# Patient Record
Sex: Male | Born: 1960
Health system: Southern US, Community
[De-identification: ages and names within clinical notes are randomized; demographics above are authoritative.]

## PROBLEM LIST (undated history)

## (undated) DIAGNOSIS — A4902 Methicillin resistant Staphylococcus aureus infection, unspecified site: Secondary | ICD-10-CM

## (undated) DIAGNOSIS — F1010 Alcohol abuse, uncomplicated: Secondary | ICD-10-CM

## (undated) DIAGNOSIS — F1410 Cocaine abuse, uncomplicated: Secondary | ICD-10-CM

## (undated) DIAGNOSIS — R569 Unspecified convulsions: Secondary | ICD-10-CM

## (undated) DIAGNOSIS — I10 Essential (primary) hypertension: Secondary | ICD-10-CM

## (undated) DIAGNOSIS — I639 Cerebral infarction, unspecified: Secondary | ICD-10-CM

## (undated) DIAGNOSIS — Z720 Tobacco use: Secondary | ICD-10-CM

## (undated) HISTORY — PX: SKIN GRAFT: SHX250

## (undated) HISTORY — DX: Methicillin resistant Staphylococcus aureus infection, unspecified site: A49.02

## (undated) HISTORY — DX: Unspecified convulsions: R56.9

---

## 1998-09-17 ENCOUNTER — Encounter: Admission: RE | Admit: 1998-09-17 | Discharge: 1998-09-17 | Payer: Self-pay | Admitting: *Deleted

## 2008-12-18 ENCOUNTER — Emergency Department (HOSPITAL_COMMUNITY): Admission: EM | Admit: 2008-12-18 | Discharge: 2008-12-18 | Payer: Self-pay | Admitting: Emergency Medicine

## 2012-06-14 DIAGNOSIS — I639 Cerebral infarction, unspecified: Secondary | ICD-10-CM

## 2012-06-14 DIAGNOSIS — F1410 Cocaine abuse, uncomplicated: Secondary | ICD-10-CM

## 2012-06-14 HISTORY — DX: Cocaine abuse, uncomplicated: F14.10

## 2012-06-14 HISTORY — DX: Cerebral infarction, unspecified: I63.9

## 2013-01-15 ENCOUNTER — Other Ambulatory Visit: Payer: Self-pay

## 2013-01-15 ENCOUNTER — Inpatient Hospital Stay (HOSPITAL_COMMUNITY): Payer: Self-pay

## 2013-01-15 ENCOUNTER — Inpatient Hospital Stay (HOSPITAL_COMMUNITY)
Admission: EM | Admit: 2013-01-15 | Discharge: 2013-01-17 | DRG: 065 | Disposition: A | Discharge status: Home / Self Care | Payer: MEDICAID | Attending: Internal Medicine | Admitting: Internal Medicine

## 2013-01-15 ENCOUNTER — Emergency Department (HOSPITAL_COMMUNITY): Payer: Self-pay

## 2013-01-15 ENCOUNTER — Encounter (HOSPITAL_COMMUNITY): Payer: Self-pay | Admitting: Emergency Medicine

## 2013-01-15 ENCOUNTER — Emergency Department (INDEPENDENT_AMBULATORY_CARE_PROVIDER_SITE_OTHER)
Admission: EM | Admit: 2013-01-15 | Discharge: 2013-01-15 | Disposition: A | Discharge status: Nursing Home (Includes ICF) | Payer: Self-pay | Source: Home / Self Care

## 2013-01-15 ENCOUNTER — Encounter (HOSPITAL_COMMUNITY): Payer: Self-pay | Admitting: *Deleted

## 2013-01-15 DIAGNOSIS — F1410 Cocaine abuse, uncomplicated: Secondary | ICD-10-CM | POA: Diagnosis present

## 2013-01-15 DIAGNOSIS — I6350 Cerebral infarction due to unspecified occlusion or stenosis of unspecified cerebral artery: Principal | ICD-10-CM | POA: Diagnosis present

## 2013-01-15 DIAGNOSIS — I10 Essential (primary) hypertension: Secondary | ICD-10-CM

## 2013-01-15 DIAGNOSIS — R279 Unspecified lack of coordination: Secondary | ICD-10-CM | POA: Diagnosis present

## 2013-01-15 DIAGNOSIS — F1010 Alcohol abuse, uncomplicated: Secondary | ICD-10-CM

## 2013-01-15 DIAGNOSIS — F1090 Alcohol use, unspecified, uncomplicated: Secondary | ICD-10-CM | POA: Diagnosis present

## 2013-01-15 DIAGNOSIS — F1020 Alcohol dependence, uncomplicated: Secondary | ICD-10-CM | POA: Diagnosis present

## 2013-01-15 DIAGNOSIS — R42 Dizziness and giddiness: Secondary | ICD-10-CM | POA: Diagnosis present

## 2013-01-15 DIAGNOSIS — R269 Unspecified abnormalities of gait and mobility: Secondary | ICD-10-CM

## 2013-01-15 DIAGNOSIS — I639 Cerebral infarction, unspecified: Secondary | ICD-10-CM | POA: Diagnosis present

## 2013-01-15 DIAGNOSIS — F17200 Nicotine dependence, unspecified, uncomplicated: Secondary | ICD-10-CM

## 2013-01-15 DIAGNOSIS — R2689 Other abnormalities of gait and mobility: Secondary | ICD-10-CM

## 2013-01-15 LAB — CBC
HCT: 41.3 % (ref 39.0–52.0)
Hemoglobin: 14 g/dL (ref 13.0–17.0)
MCH: 32.6 pg (ref 26.0–34.0)
MCHC: 33.9 g/dL (ref 30.0–36.0)
MCV: 96.3 fL (ref 78.0–100.0)
Platelets: 229 10*3/uL (ref 150–400)
RBC: 4.29 MIL/uL (ref 4.22–5.81)
RDW: 13.1 % (ref 11.5–15.5)
WBC: 6.4 10*3/uL (ref 4.0–10.5)

## 2013-01-15 LAB — CBC WITH DIFFERENTIAL/PLATELET
Basophils Absolute: 0 10*3/uL (ref 0.0–0.1)
Basophils Relative: 1 % (ref 0–1)
Eosinophils Absolute: 0.1 10*3/uL (ref 0.0–0.7)
Eosinophils Relative: 2 % (ref 0–5)
HCT: 42.9 % (ref 39.0–52.0)
Hemoglobin: 14.3 g/dL (ref 13.0–17.0)
Lymphocytes Relative: 47 % — ABNORMAL HIGH (ref 12–46)
Lymphs Abs: 2.5 10*3/uL (ref 0.7–4.0)
MCH: 32.3 pg (ref 26.0–34.0)
MCHC: 33.3 g/dL (ref 30.0–36.0)
MCV: 96.8 fL (ref 78.0–100.0)
Monocytes Absolute: 0.5 10*3/uL (ref 0.1–1.0)
Monocytes Relative: 10 % (ref 3–12)
Neutro Abs: 2.2 10*3/uL (ref 1.7–7.7)
Neutrophils Relative %: 42 % — ABNORMAL LOW (ref 43–77)
Platelets: 217 10*3/uL (ref 150–400)
RBC: 4.43 MIL/uL (ref 4.22–5.81)
RDW: 13 % (ref 11.5–15.5)
WBC: 5.3 10*3/uL (ref 4.0–10.5)

## 2013-01-15 LAB — POCT I-STAT, CHEM 8
BUN: 6 mg/dL (ref 6–23)
Calcium, Ion: 1.19 mmol/L (ref 1.12–1.23)
Chloride: 103 mEq/L (ref 96–112)
Creatinine, Ser: 1.1 mg/dL (ref 0.50–1.35)
Glucose, Bld: 95 mg/dL (ref 70–99)
HCT: 47 % (ref 39.0–52.0)
Hemoglobin: 16 g/dL (ref 13.0–17.0)
Potassium: 3.7 mEq/L (ref 3.5–5.1)
Sodium: 139 mEq/L (ref 135–145)
TCO2: 24 mmol/L (ref 0–100)

## 2013-01-15 LAB — URINALYSIS, ROUTINE W REFLEX MICROSCOPIC
Bilirubin Urine: NEGATIVE
Glucose, UA: NEGATIVE mg/dL
Hgb urine dipstick: NEGATIVE
Ketones, ur: NEGATIVE mg/dL
Leukocytes, UA: NEGATIVE
Nitrite: NEGATIVE
Protein, ur: NEGATIVE mg/dL
Specific Gravity, Urine: 1.014 (ref 1.005–1.030)
Urobilinogen, UA: 0.2 mg/dL (ref 0.0–1.0)
pH: 5.5 (ref 5.0–8.0)

## 2013-01-15 LAB — RAPID URINE DRUG SCREEN, HOSP PERFORMED
Amphetamines: NOT DETECTED
Barbiturates: NOT DETECTED
Benzodiazepines: NOT DETECTED
Cocaine: POSITIVE — AB
Opiates: NOT DETECTED
Tetrahydrocannabinol: NOT DETECTED

## 2013-01-15 LAB — APTT: aPTT: 28 seconds (ref 24–37)

## 2013-01-15 LAB — CREATININE, SERUM
Creatinine, Ser: 0.99 mg/dL (ref 0.50–1.35)
GFR calc Af Amer: >90 mL/min (ref >90)
GFR calc non Af Amer: >90 mL/min (ref >90)

## 2013-01-15 LAB — PROTIME-INR
INR: 1.11 (ref 0.00–1.49)
Prothrombin Time: 14.1 seconds (ref 11.6–15.2)

## 2013-01-15 LAB — GLUCOSE, CAPILLARY: Glucose-Capillary: 80 mg/dL (ref 70–99)

## 2013-01-15 LAB — TROPONIN I: Troponin I: <0.3 ng/mL (ref <0.30)

## 2013-01-15 MED ORDER — SENNOSIDES-DOCUSATE SODIUM 8.6-50 MG PO TABS
1.0000 | ORAL_TABLET | Freq: Every evening | ORAL | Status: DC | PRN
  Filled 2013-01-15: qty 1

## 2013-01-15 MED ORDER — LABETALOL HCL 5 MG/ML IV SOLN
10.0000 mg | Freq: Once | INTRAVENOUS | Status: AC
  Administered 2013-01-15: 10 mg via INTRAVENOUS
  Filled 2013-01-15: qty 4

## 2013-01-15 MED ORDER — ENOXAPARIN SODIUM 40 MG/0.4ML ~~LOC~~ SOLN
40.0000 mg | SUBCUTANEOUS | Status: DC
  Administered 2013-01-15 – 2013-01-16 (×2): 40 mg via SUBCUTANEOUS
  Filled 2013-01-15 (×3): qty 0.4

## 2013-01-15 MED ORDER — PNEUMOCOCCAL VAC POLYVALENT 25 MCG/0.5ML IJ INJ
0.5000 mL | INJECTION | INTRAMUSCULAR | Status: AC
  Administered 2013-01-16: 0.5 mL via INTRAMUSCULAR
  Filled 2013-01-15: qty 0.5

## 2013-01-15 MED ORDER — HYDRALAZINE HCL 20 MG/ML IJ SOLN
10.0000 mg | Freq: Once | INTRAMUSCULAR | Status: AC
  Administered 2013-01-15: 10 mg via INTRAVENOUS
  Filled 2013-01-15: qty 1

## 2013-01-15 NOTE — ED Notes (Signed)
Patient denies pain, alert and oriented x 3 , mae x 4 , patient gait is off balance.  Patient reports gait issues since Friday.  Reports a fall Friday after onset of gait imbalance.

## 2013-01-15 NOTE — ED Notes (Signed)
[facility] Gerald [name] E, Patient was given the orange card application and instructions on where to take the completed application to. Patient does have my contact information if he has any questions.

## 2013-01-15 NOTE — H&P (Signed)
[facility] History and Physical  VIVAN AGOSTINO ZOX:[id number] DOB: November 14, 1960 DOA: 01/15/2013  Referring physician PCP: No PCP Per Patient  Specialists: (Neurology) Dr. Thana Farr  Chief Complaint: Right-sided weakness, right gait disturbance  HPI: Gerald [name] is an 52 y.o BM PMHx long-standing HTN, was not visited a physician and a significant amount of time, no PCP. Awoke Saturday AM and noted he was dizzy and having trouble with his gait. HE seemed to list to the right. He did not seek medical atttention at that time. He continued to have symptoms through Sunday into Monday AM. He visited his sister who took his BP this am and noted it was 173/102 and brought him to urgent care.  They told him to go to [facility] hospital for hypertensive urgency.Marland Kitchen HIs BP has ranged from 194-214/ 105-122. Currently he is awake and alert but when he walks he shows some weakness in his right leg. Currently patient performed all neurochecks within normal limits except for slight weakness in the right shoulder, Clydie Braun the right face when asked to smile   Review of Systems: The patient denies anorexia, fever, weight loss,, vision loss, decreased hearing, hoarseness, chest pain, syncope, dyspnea on exertion, peripheral edema, hemoptysis, abdominal pain, melena, hematochezia, severe indigestion/heartburn, hematuria, incontinence, genital sores, muscle weakness, suspicious skin lesions, transient blindness, depression, unusual weight change, abnormal bleeding, enlarged lymph nodes, angioedema, and breast masses.      Social History: Positive smokes 2-3 cigarettes per day x20 years, drinks 40 ounces x2 per night, negative drug, single, negative children    No Known Allergies  Family History  Problem Relation Age of Onset  . Hypertension Mother   . Hypertension Father      Prior to Admission medications   Not on File   Physical Exam: Filed Vitals:   01/15/13 1915 01/15/13 1930 01/15/13 1945  01/15/13 2021  BP: 180/107 187/99 165/98 188/110  Pulse: 69 97 83 84  Temp:    98.3 F (36.8 C)  TempSrc:    Oral  Resp: 23 19 21 22   Height:    6' (1.829 m)  Weight:    75.6 kg (166 lb 10.7 oz)  SpO2: 100% 96% 93% 100%     General: Alert, no acute distress    Eyes: He will he will round reactive to light and accommodation  Cardiovascular: Regular rhythm and rate, negative murmurs rubs gallops, DP/PT pulse 2+**  Respiratory: Clear 2 auscultation bilateral  Abdomen: Soft nontender nondistended plus bowel sounds  Musculoskeletal: Within normal  Neurologic: Pupils equal round reactive to light and accommodation, -- 2 through 12 grossly intact muscle strength 5/5 all extremities except for right shoulder unable to fully shrug right shoulder, sensation intact throughout, negative Romberg, negative pronator drift  Labs on Admission:  Basic Metabolic Panel:  Recent Labs Lab 01/15/13 1432  NA 139  K 3.7  CL 103  GLUCOSE 95  BUN 6  CREATININE 1.10   Liver Function Tests: No results found for this basename: AST, ALT, ALKPHOS, BILITOT, PROT, ALBUMIN,  in the last 168 hours No results found for this basename: LIPASE, AMYLASE,  in the last 168 hours No results found for this basename: AMMONIA,  in the last 168 hours CBC:  Recent Labs Lab 01/15/13 1423 01/15/13 1432  WBC 5.3  --   NEUTROABS 2.2  --   HGB 14.3 16.0  HCT 42.9 47.0  MCV 96.8  --   PLT 217  --    Cardiac Enzymes:  Recent Labs Lab 01/15/13 1423  TROPONINI <0.30    BNP (last 3 results) No results found for this basename: PROBNP,  in the last 8760 hours CBG:  Recent Labs Lab 01/15/13 1416  GLUCAP 80    Radiological Exams on Admission: Ct Head (brain) Wo Contrast  01/15/2013   *RADIOLOGY REPORT*  Clinical Data: Hypertension and balance difficulties  CT HEAD WITHOUT CONTRAST  Technique:  Contiguous axial images were obtained from the base of the skull through the vertex without contrast.   Comparison: None.  Findings: Bony calvarium is intact.  There is an area of decreased attenuation involving the anterior aspect of the internal capsule on the left as well as the left basal ganglia.  This is consistent with a subacute infarct.  No acute hemorrhage, acute infarction or space-occupying mass lesion is noted.  IMPRESSION: Subacute infarct on the left as described.  No acute abnormality is noted.   Original Report Authenticated By: Alcide Clever, M.D.    EKG: No EKG for comparison normal sinus rhythm with criteria for LVH    Assessment/Plan Active Problems:   * No active hospital problems. *   1. CVA; neurology is noted in consult it, he or Thana Farr in the ED. Treatment will be per protocol. Protocol was explained to patient girlfriend and sister. 2. HTN patient's blood pressure has been by approximately 20% which should be the goal for the next 24 hours. After that will continue to attempt to lower patient's BP prior to discharge. Nicotine dependence. Counseled patient that he must stop smoking and drinking   Code Status: Full Family Communication: Sister and girlfriend present during discussion of plan of care Disposition Plan: Per neuro  Time spent: 90 minute  Drema Dallas [facility] Pager [phone]  If 7PM-7AM, please contact night-coverage [email or web address] Password [facility] 01/15/2013, 8:57 PM

## 2013-01-15 NOTE — ED Provider Notes (Signed)
Gerald [name] is a 52 y.o. male who presents to Urgent Care today for hypertension and poor balance. Patient does not have health insurance and does not go to a doctor regularly. On Friday he developed poor balance and had a fall. He says that it was as though he were drunk. He was seen by a family member who obtained his blood pressure which was significantly elevated with a systolic pressure in the 170s. He notes that his poor balance has continued to today. He denies any weakness or numbness problems breathing or swallowing or speaking. He denies any dizziness or lightheadedness. He feels well otherwise with no headache or vision change.    PMH reviewed. Undiagnosed hypertension History  Substance Use Topics  . Smoking status: Current Every Day Smoker  . Smokeless tobacco: Not on file  . Alcohol Use: Yes   ROS as above Medications reviewed. No current facility-administered medications for this encounter.   No current outpatient prescriptions on file.    Exam:  BP 201/112  Temp(Src) 98.5 F (36.9 C) (Oral)  Resp 18  SpO2 100% Gen: Well NAD HEENT: EOMI,  MMM, PERRLA Lungs: CTABL Nl WOB Heart: RRR no MRG Abd: NABS, NT, ND Exts: Non edematous BL  LE, warm and well perfused.  Neuro: Alert and oriented cranial nerves II through XII are intact normal sensation and strength. Coordination is normal with normal finger-nose-finger and shin rub. Patient has difficulty with tandem gait often losing his balance.    No results found for this or any previous visit (from the past 24 hour(s)). No results found.  Assessment and Plan: 52 y.o. male with hypertension and neurological abnormality. I'm concerned patient had a cerebellar infarct or PRES syndrome. Plan to transfer to the emergency room for further evaluation and management.  Patient expresses understanding and agreement     Rodolph Bong, MD 01/15/13 1245

## 2013-01-15 NOTE — ED Notes (Addendum)
Reports blood pressure concerns on information sheet.  Onset Saturday of concerns.  Reports to staff feeling lightheaded

## 2013-01-15 NOTE — Consult Note (Signed)
Referring Physician: Ranae Palms    Chief Complaint: stroke  HPI:                                                                                                                                         Gerald [name] is an 52 y.o. male with no past medical history who awoke Saturday AM and noted he was dizzy and having trouble with his gait.  HE seemed to list to the right. He did not seek medical atttention at that time.  He continued to have symptoms through Sunday into Monday AM. He visited his sister who tool his BP this am and noted it was 173/102 and brought him to urgent care.  They told him to go to [facility] hospital for hypertensive urgency.Marland Kitchen HIs BP has ranged from 194-214/ 105-122.  Currently he is awake and alert but when he walks he shows some weakness in his right leg.   Date last known well: Date: 12/14/2012 Time last known well: Unable to determine tPA Given: No: out of window  No pertinent past medical history prior to admission  History reviewed. No pertinent past surgical history.  Family History  Problem Relation Age of Onset  . Hypertension Mother   . Hypertension Father    Social History:  reports that he has been smoking Cigarettes.  He has been smoking about 0.00 packs per day. He does not have any smokeless tobacco history on file. He reports that he drinks about 12.6 ounces of alcohol per week. He reports that he uses illicit drugs.  Allergies: No Known Allergies  Medications:                                                                                                                           No current facility-administered medications for this encounter.   No current outpatient prescriptions on file.    ROS:  History obtained from the patient  General ROS: negative for - chills, fatigue, fever, night sweats, weight gain or  weight loss Psychological ROS: negative for - behavioral disorder, hallucinations, memory difficulties, mood swings or suicidal ideation Ophthalmic ROS: negative for - blurry vision, double vision, eye pain or loss of vision ENT ROS: negative for - epistaxis, nasal discharge, oral lesions, sore throat, tinnitus or vertigo Allergy and Immunology ROS: negative for - hives or itchy/watery eyes Hematological and Lymphatic ROS: negative for - bleeding problems, bruising or swollen lymph nodes Endocrine ROS: negative for - galactorrhea, hair pattern changes, polydipsia/polyuria or temperature intolerance Respiratory ROS: negative for - cough, hemoptysis, shortness of breath or wheezing Cardiovascular ROS: negative for - chest pain, dyspnea on exertion, edema or irregular heartbeat Gastrointestinal ROS: negative for - abdominal pain, diarrhea, hematemesis, nausea/vomiting or stool incontinence Genito-Urinary ROS: negative for - dysuria, hematuria, incontinence or urinary frequency/urgency Musculoskeletal ROS: negative for - joint swelling or muscular weakness Neurological ROS: as noted in HPI Dermatological ROS: negative for rash and skin lesion changes  Neurologic Examination:                                                                                                      Blood pressure 184/99, pulse 66, temperature 97.7 F (36.5 C), temperature source Oral, resp. rate 19, height 6' (1.829 m), weight 72.576 kg (160 lb), SpO2 98.00%.  Mental Status: Alert, oriented, thought content appropriate.  Speech fluent without evidence of aphasia.  Able to follow 3 step commands without difficulty. Cranial Nerves: II: Discs flat bilaterally; Visual fields grossly normal, pupils equal, round, reactive to light and accommodation III,IV, VI: ptosis not present, extra-ocular motions intact bilaterally V,VII: smile symmetric, facial light touch sensation normal bilaterally VIII: hearing normal  bilaterally IX,X: gag reflex present XI: bilateral shoulder shrug XII: midline tongue extension Motor: Right : Upper extremity   5/5    Left:     Upper extremity   5/5  Lower extremity   5/5     Lower extremity   5/5 Tone and bulk:normal tone throughout; no atrophy noted Sensory: Pinprick and light touch intact throughout, bilaterally Deep Tendon Reflexes:  Right: Upper Extremity   Left: Upper extremity   biceps (C-5 to C-6) 2/4   biceps (C-5 to C-6) 2/4 tricep (C7) 2/4    triceps (C7) 2/4 Brachioradialis (C6) 2/4  Brachioradialis (C6) 2/4  Lower Extremity Lower Extremity  quadriceps (L-2 to L-4) 2/4   quadriceps (L-2 to L-4) 2/4 Achilles (S1) 2/4   Achilles (S1) 2/4  Plantars: Right: downgoing   Left: downgoing Cerebellar: normal finger-to-nose,  normal heel-to-shin test Gait: he does show right LE weakness when walking making him list to the right.  CV: pulses palpable throughout    Results for orders placed during the hospital encounter of 01/15/13 (from the past 48 hour(s))  GLUCOSE, CAPILLARY     Status: None   Collection Time    01/15/13  2:16 PM      Result Value Range   Glucose-Capillary 80  70 - 99 mg/dL  PROTIME-INR  Status: None   Collection Time    01/15/13  2:23 PM      Result Value Range   Prothrombin Time 14.1  11.6 - 15.2 seconds   INR 1.11  0.00 - 1.49  APTT     Status: None   Collection Time    01/15/13  2:23 PM      Result Value Range   aPTT 28  24 - 37 seconds  CBC WITH DIFFERENTIAL     Status: Abnormal   Collection Time    01/15/13  2:23 PM      Result Value Range   WBC 5.3  4.0 - 10.5 K/uL   RBC 4.43  4.22 - 5.81 MIL/uL   Hemoglobin 14.3  13.0 - 17.0 g/dL   HCT 16.1  09.6 - 04.5 %   MCV 96.8  78.0 - 100.0 fL   MCH 32.3  26.0 - 34.0 pg   MCHC 33.3  30.0 - 36.0 g/dL   RDW 40.9  81.1 - 91.4 %   Platelets 217  150 - 400 K/uL   Neutrophils Relative % 42 (*) 43 - 77 %   Neutro Abs 2.2  1.7 - 7.7 K/uL   Lymphocytes Relative 47 (*) 12 - 46  %   Lymphs Abs 2.5  0.7 - 4.0 K/uL   Monocytes Relative 10  3 - 12 %   Monocytes Absolute 0.5  0.1 - 1.0 K/uL   Eosinophils Relative 2  0 - 5 %   Eosinophils Absolute 0.1  0.0 - 0.7 K/uL   Basophils Relative 1  0 - 1 %   Basophils Absolute 0.0  0.0 - 0.1 K/uL  TROPONIN I     Status: None   Collection Time    01/15/13  2:23 PM      Result Value Range   Troponin I <0.30  <0.30 ng/mL   Comment:            Due to the release kinetics of cTnI,     a negative result within the first hours     of the onset of symptoms does not rule out     myocardial infarction with certainty.     If myocardial infarction is still suspected,     repeat the test at appropriate intervals.  POCT I-STAT, CHEM 8     Status: None   Collection Time    01/15/13  2:32 PM      Result Value Range   Sodium 139  135 - 145 mEq/L   Potassium 3.7  3.5 - 5.1 mEq/L   Chloride 103  96 - 112 mEq/L   BUN 6  6 - 23 mg/dL   Creatinine, Ser 7.82  0.50 - 1.35 mg/dL   Glucose, Bld 95  70 - 99 mg/dL   Calcium, Ion 9.56  2.13 - 1.23 mmol/L   TCO2 24  0 - 100 mmol/L   Hemoglobin 16.0  13.0 - 17.0 g/dL   HCT 08.6  57.8 - 46.9 %  URINALYSIS, ROUTINE W REFLEX MICROSCOPIC     Status: None   Collection Time    01/15/13  3:38 PM      Result Value Range   Color, Urine YELLOW  YELLOW   APPearance CLEAR  CLEAR   Specific Gravity, Urine 1.014  1.005 - 1.030   pH 5.5  5.0 - 8.0   Glucose, UA NEGATIVE  NEGATIVE mg/dL   Hgb urine dipstick NEGATIVE  NEGATIVE   Bilirubin  Urine NEGATIVE  NEGATIVE   Ketones, ur NEGATIVE  NEGATIVE mg/dL   Protein, ur NEGATIVE  NEGATIVE mg/dL   Urobilinogen, UA 0.2  0.0 - 1.0 mg/dL   Nitrite NEGATIVE  NEGATIVE   Leukocytes, UA NEGATIVE  NEGATIVE   Comment: MICROSCOPIC NOT DONE ON URINES WITH NEGATIVE PROTEIN, BLOOD, LEUKOCYTES, NITRITE, OR GLUCOSE <1000 mg/dL.  URINE RAPID DRUG SCREEN (HOSP PERFORMED)     Status: Abnormal   Collection Time    01/15/13  3:38 PM      Result Value Range   Opiates NONE  DETECTED  NONE DETECTED   Cocaine POSITIVE (*) NONE DETECTED   Benzodiazepines NONE DETECTED  NONE DETECTED   Amphetamines NONE DETECTED  NONE DETECTED   Tetrahydrocannabinol NONE DETECTED  NONE DETECTED   Barbiturates NONE DETECTED  NONE DETECTED   Comment:            DRUG SCREEN FOR MEDICAL PURPOSES     ONLY.  IF CONFIRMATION IS NEEDED     FOR ANY PURPOSE, NOTIFY LAB     WITHIN 5 DAYS.                LOWEST DETECTABLE LIMITS     FOR URINE DRUG SCREEN     Drug Class       Cutoff (ng/mL)     Amphetamine      1000     Barbiturate      200     Benzodiazepine   200     Tricyclics       300     Opiates          300     Cocaine          300     THC              50   Ct Head (brain) Wo Contrast  01/15/2013   *RADIOLOGY REPORT*  Clinical Data: Hypertension and balance difficulties  CT HEAD WITHOUT CONTRAST  Technique:  Contiguous axial images were obtained from the base of the skull through the vertex without contrast.  Comparison: None.  Findings: Bony calvarium is intact.  There is an area of decreased attenuation involving the anterior aspect of the internal capsule on the left as well as the left basal ganglia.  This is consistent with a subacute infarct.  No acute hemorrhage, acute infarction or space-occupying mass lesion is noted.  IMPRESSION: Subacute infarct on the left as described.  No acute abnormality is noted.   Original Report Authenticated By: Alcide Clever, M.D.    Felicie Morn PA-C [facility] [phone]  01/15/2013, 6:46 PM   Patient seen and examined.  Clinical course and management discussed.  Necessary edits performed.  I agree with the above.  Assessment and plan of care developed and discussed below.  Assessment: 52 y.o. male presenting with complaints of difficulty with gait and markedly elevated blood pressure.  Examination otherwise fairly unremarkable.  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  Patient with  vascular risk factors of smoking, hypertension and cocaine use.  Further work up recommended.  Stroke Risk Factors - hypertension, smoking, illicit drug use  Plan: 1. HgbA1c, fasting lipid panel 2. MRI, MRA  of the brain without contrast 3. PT consult, OT consult, Speech consult 4. Echocardiogram 5. Carotid dopplers 6. Prophylactic therapy-Antiplatelet med: Aspirin - dose 325mg  daily 7. Smoking cessation counseling.  ETOH and drug counseling as well. 8. Telemetry monitoring  9. Frequent neuro checks 10. BP control     Thana Farr, MD [facility] [phone]  01/15/2013  6:46 PM

## 2013-01-15 NOTE — ED Notes (Signed)
PT states he has been stumbling when he walks since Saturday. Pt also states his pressure has never been high except when he went to the Pcp today and they told him it was elevated so he does not take any medications.  Pt states he developed a slight headache today.

## 2013-01-15 NOTE — ED Notes (Signed)
Since Sat pt has been having difficulty keeping his balance.  Was sent here from UC for bp of 193/113.  Speech intact.  Pt c/o slight headache.

## 2013-01-15 NOTE — ED Notes (Signed)
Pt ambulated from bed to door without assistance, states he felt a little dizzy.

## 2013-01-15 NOTE — ED Provider Notes (Signed)
CSN: [id number]     Arrival date & time 01/15/13  1308 History     First MD Initiated Contact with Patient 01/15/13 1448     Chief Complaint  Patient presents with  . Hypertension  . Gait Problem   (Consider location/radiation/quality/duration/timing/severity/associated sxs/prior Treatment) HPI Comments: 52 y.o. M presenting from Urgent Care center today due to HTN and dizziness.  Pt is unsure about prior h/o HTN, states he does not see a physician regularly.  Symptom onset 2 days ago, pt states he had sudden onset of lightheadedness and near-syncope, states he fell backwards but fell into a stand which caught him- did not fall to floor or hit head.  Since then pt has had persistent lightheadedness which he states occasionally feels as if room is spinning around him, and reports "clumsiness" with walking.  Pt went to sister's house where they took his BP and found it to be elevated in the 170s systolic.  Pt seen at urgent care following this and referred here for further evaluation.  He reports mild generalized HA and lightheadedness worse with walking, no neck pain or stiffness.  No vision changes, numbness, or weakness.  Denies chest pain or dyspnea.  Patient is a 52 y.o. male presenting with hypertension and neurologic complaint. The history is provided by the patient.  Hypertension This is a new problem. Episode onset: 2 days ago. The problem occurs constantly. The problem has been unchanged. Associated symptoms include vertigo (intermittent). Pertinent negatives include no abdominal pain, arthralgias, change in bowel habit, chest pain, chills, coughing, diaphoresis, fatigue, fever, headaches, myalgias, nausea, neck pain, numbness, urinary symptoms, visual change, vomiting or weakness. Associated symptoms comments: lightheadedness. Nothing aggravates the symptoms. He has tried nothing for the symptoms. The treatment provided no relief.  Neurologic Problem This is a new problem. Episode onset: 2  days ago. The problem occurs constantly. The problem has been unchanged. Associated symptoms include vertigo (intermittent). Pertinent negatives include no abdominal pain, arthralgias, change in bowel habit, chest pain, chills, coughing, diaphoresis, fatigue, fever, headaches, myalgias, nausea, neck pain, numbness, urinary symptoms, visual change, vomiting or weakness. Associated symptoms comments: Gait disturbance- "clumsiness". Nothing aggravates the symptoms. He has tried nothing for the symptoms. The treatment provided no relief.    History reviewed. No pertinent past medical history. Past Surgical History  Procedure Laterality Date  . Skin graft  Left Leg    1980s   Family History  Problem Relation Age of Onset  . Hypertension Mother   . Hypertension Father    History  Substance Use Topics  . Smoking status: Current Every Day Smoker -- 0.25 packs/day for 20 years    Types: Cigarettes  . Smokeless tobacco: Not on file  . Alcohol Use: 7.2 oz/week    12 Cans of beer per week     Comment: drinks 2-40oz beers per day    Review of Systems  Constitutional: Negative for fever, chills, diaphoresis, appetite change and fatigue.  HENT: Negative for neck pain and neck stiffness.   Eyes: Negative for photophobia and visual disturbance.  Respiratory: Negative for cough, chest tightness, shortness of breath and wheezing.   Cardiovascular: Negative for chest pain, palpitations and leg swelling.  Gastrointestinal: Negative for nausea, vomiting, abdominal pain, diarrhea, abdominal distention and change in bowel habit.  Musculoskeletal: Positive for gait problem. Negative for myalgias, back pain and arthralgias.  Neurological: Positive for dizziness, vertigo (intermittent) and light-headedness. Negative for syncope, facial asymmetry, speech difficulty, weakness, numbness and headaches.  All other  systems reviewed and are negative.    Allergies  Review of patient's allergies indicates no known  allergies.  Home Medications  No current outpatient prescriptions on file. BP 161/102  Pulse 71  Temp(Src) 98.5 F (36.9 C) (Oral)  Resp 19  Ht 6' (1.829 m)  Wt 166 lb 10.7 oz (75.6 kg)  BMI 22.6 kg/m2  SpO2 100% Physical Exam  Nursing note and vitals reviewed. Constitutional: He is oriented to person, place, and time. He appears well-developed and well-nourished. No distress.  HENT:  Head: Normocephalic and atraumatic.  Eyes: EOM are normal. Pupils are equal, round, and reactive to light.  Neck: Normal range of motion. Neck supple.  Cardiovascular: Normal rate, regular rhythm, normal heart sounds and intact distal pulses.   Pulmonary/Chest: Effort normal and breath sounds normal. No respiratory distress. He has no wheezes. He has no rales.  Abdominal: Soft. Bowel sounds are normal. He exhibits no distension. There is no tenderness. There is no rebound and no guarding.  Musculoskeletal: Normal range of motion. He exhibits no edema and no tenderness.  Neurological: He is alert and oriented to person, place, and time. He has normal strength. He displays normal reflexes. No cranial nerve deficit or sensory deficit. He exhibits normal muscle tone. He displays a negative Romberg sign. Coordination and gait normal. GCS eye subscore is 4. GCS verbal subscore is 5. GCS motor subscore is 6.  Skin: Skin is warm and dry. He is not diaphoretic.    ED Course   Procedures (including critical care time)  Labs Reviewed  CBC WITH DIFFERENTIAL - Abnormal; Notable for the following:    Neutrophils Relative % 42 (*)    Lymphocytes Relative 47 (*)    All other components within normal limits  URINE RAPID DRUG SCREEN (HOSP PERFORMED) - Abnormal; Notable for the following:    Cocaine POSITIVE (*)    All other components within normal limits  MRSA PCR SCREENING  URINALYSIS, ROUTINE W REFLEX MICROSCOPIC  GLUCOSE, CAPILLARY  PROTIME-INR  APTT  TROPONIN I  CBC  CREATININE, SERUM  HEMOGLOBIN A1C   LIPID PANEL  POCT I-STAT, CHEM 8   Dg Chest 2 View  01/15/2013   *RADIOLOGY REPORT*  Clinical Data: Hypertension.  CHEST - 2 VIEW  Comparison: None.  Findings: Lungs are clear.  Heart size is normal.  No pneumothorax or pleural effusion.  No focal bony abnormality.  IMPRESSION: No acute disease.   Original Report Authenticated By: Holley Dexter, M.D.   Ct Head (brain) Wo Contrast  01/15/2013   *RADIOLOGY REPORT*  Clinical Data: Hypertension and balance difficulties  CT HEAD WITHOUT CONTRAST  Technique:  Contiguous axial images were obtained from the base of the skull through the vertex without contrast.  Comparison: None.  Findings: Bony calvarium is intact.  There is an area of decreased attenuation involving the anterior aspect of the internal capsule on the left as well as the left basal ganglia.  This is consistent with a subacute infarct.  No acute hemorrhage, acute infarction or space-occupying mass lesion is noted.  IMPRESSION: Subacute infarct on the left as described.  No acute abnormality is noted.   Original Report Authenticated By: Alcide Clever, M.D.   1. CVA (cerebral infarction)   2. Hypertension   3. Alcohol abuse, unspecified   4. HTN (hypertension)   5. Nicotine dependence     MDM  52 y.o. M presenting from Urgent Care center today due to HTN and dizziness ongoing for the past 2 days.  He reports near syncopal event 2 days ago and gait disturbance described as clumsiness. Vital signs notable for HTN in 200s/100s. Physical exam WNL, no focal neuro deficits or current gait abnormalities.  Will check CT head to r/o CVA or other intracranial abnormality.  CBC, CMP, PT/INR obtained.  Labetalol for HTN.  CT head shows L-sided subacute infarct.  Lab results WNL, normal INR and negative troponin.  Pt to be admitted for further workup of CVA and malignant HTN.  BP remains in 200s/100s, hydralazine given.  Pt stable at time of admission.  Discussed with attending Dr. Ranae Palms.  Jodean [name], MD 01/16/13 [phone]

## 2013-01-16 ENCOUNTER — Inpatient Hospital Stay (HOSPITAL_COMMUNITY): Payer: MEDICAID

## 2013-01-16 DIAGNOSIS — I6350 Cerebral infarction due to unspecified occlusion or stenosis of unspecified cerebral artery: Principal | ICD-10-CM

## 2013-01-16 DIAGNOSIS — F1010 Alcohol abuse, uncomplicated: Secondary | ICD-10-CM

## 2013-01-16 DIAGNOSIS — I10 Essential (primary) hypertension: Secondary | ICD-10-CM

## 2013-01-16 DIAGNOSIS — I517 Cardiomegaly: Secondary | ICD-10-CM

## 2013-01-16 DIAGNOSIS — F17200 Nicotine dependence, unspecified, uncomplicated: Secondary | ICD-10-CM

## 2013-01-16 LAB — LIPID PANEL
Cholesterol: 145 mg/dL (ref 0–200)
HDL: 71 mg/dL (ref >39)
LDL Cholesterol: 62 mg/dL (ref 0–99)
Total CHOL/HDL Ratio: 2 RATIO
Triglycerides: 61 mg/dL (ref <150)
VLDL: 12 mg/dL (ref 0–40)

## 2013-01-16 LAB — HEMOGLOBIN A1C
Hgb A1c MFr Bld: 5.1 % (ref <5.7)
Mean Plasma Glucose: 100 mg/dL (ref <117)

## 2013-01-16 LAB — MRSA PCR SCREENING: MRSA by PCR: NEGATIVE

## 2013-01-16 MED ORDER — LISINOPRIL 20 MG PO TABS
20.0000 mg | ORAL_TABLET | Freq: Every day | ORAL | Status: DC
  Administered 2013-01-16 – 2013-01-17 (×2): 20 mg via ORAL
  Filled 2013-01-16 (×2): qty 1

## 2013-01-16 MED ORDER — HYDROCHLOROTHIAZIDE 25 MG PO TABS
25.0000 mg | ORAL_TABLET | Freq: Every day | ORAL | Status: DC
  Administered 2013-01-16 – 2013-01-17 (×2): 25 mg via ORAL
  Filled 2013-01-16 (×2): qty 1

## 2013-01-16 MED ORDER — ASPIRIN EC 325 MG PO TBEC
325.0000 mg | DELAYED_RELEASE_TABLET | Freq: Every day | ORAL | Status: DC
  Administered 2013-01-16 – 2013-01-17 (×2): 325 mg via ORAL
  Filled 2013-01-16 (×2): qty 1

## 2013-01-16 NOTE — Progress Notes (Signed)
[facility] Progress Note [place] TEAM 1 - Stepdown/ICU TEAM   Gerald [name] IHK:[id number] DOB: 19-Feb-1961 DOA: 01/15/2013 PCP: No PCP Per Patient  Brief narrative: Gerald [name] is an 52 y.o BM PMHx long-standing HTN, was not visited a physician and a significant amount of time, no PCP. Awoke Saturday AM and noted he was dizzy and having trouble with his gait. HE seemed to list to the right. He did not seek medical atttention at that time. He continued to have symptoms through Sunday into Monday AM. He visited his sister who took his BP this am and noted it was 173/102 and brought him to urgent care. They told him to go to [facility] hospital for hypertensive urgency   Assessment/Plan: Principal Problem:   CVA (cerebral infarction) - f/u MRI, carotid dopplers and ECHO- cont ASA  Active Problems:   HTN (hypertension) - permissive HTN allowed yesterday - will start lisinopril and hctz now    Nicotine dependence - has been advised to quit  Cocaine abuse - advised to quit and informed of relationship with CVA    Alcohol abuse, unspecified - also advised to quit.     Code Status: full code Family Communication: fiance at bedside Disposition Plan: transfer to tele  Consultants: neuro  Procedures: none  Antibiotics: none  DVT prophylaxis: lovenox  HPI/Subjective: Pt states balance issues have resolved.    Objective: Blood pressure 148/97, pulse 89, temperature 98.5 F (36.9 C), temperature source Oral, resp. rate 18, height 6' (1.829 m), weight 77.021 kg (169 lb 12.8 oz), SpO2 99.00%.  Intake/Output Summary (Last 24 hours) at 01/16/13 2115 Last data filed at 01/16/13 1800  Gross per 24 hour  Intake    240 ml  Output   1050 ml  Net   -810 ml     Exam: General: No acute respiratory distress Lungs: Clear to auscultation bilaterally without wheezes or crackles Cardiovascular: Regular rate and rhythm without murmur gallop or rub normal S1 and S2 Abdomen:  Nontender, nondistended, soft, bowel sounds positive, no rebound, no ascites, no appreciable mass Extremities: No significant cyanosis, clubbing, or edema bilateral lower extremities  Data Reviewed: Basic Metabolic Panel:  Recent Labs Lab 01/15/13 1432 01/15/13 2129  NA 139  --   K 3.7  --   CL 103  --   GLUCOSE 95  --   BUN 6  --   CREATININE 1.10 0.99   Liver Function Tests: No results found for this basename: AST, ALT, ALKPHOS, BILITOT, PROT, ALBUMIN,  in the last 168 hours No results found for this basename: LIPASE, AMYLASE,  in the last 168 hours No results found for this basename: AMMONIA,  in the last 168 hours CBC:  Recent Labs Lab 01/15/13 1423 01/15/13 1432 01/15/13 2129  WBC 5.3  --  6.4  NEUTROABS 2.2  --   --   HGB 14.3 16.0 14.0  HCT 42.9 47.0 41.3  MCV 96.8  --  96.3  PLT 217  --  229   Cardiac Enzymes:  Recent Labs Lab 01/15/13 1423  TROPONINI <0.30   BNP (last 3 results) No results found for this basename: PROBNP,  in the last 8760 hours CBG:  Recent Labs Lab 01/15/13 1416  GLUCAP 80    Recent Results (from the past 240 hour(s))  MRSA PCR SCREENING     Status: None   Collection Time    01/15/13 11:59 PM      Result Value Range Status   MRSA  by PCR NEGATIVE  NEGATIVE Final   Comment:            The GeneXpert MRSA Assay (FDA     approved for NASAL specimens     only), is one component of a     comprehensive MRSA colonization     surveillance program. It is not     intended to diagnose MRSA     infection nor to guide or     monitor treatment for     MRSA infections.     Studies:  Recent x-ray studies have been reviewed in detail by the Attending Physician  Scheduled Meds:  Scheduled Meds: . aspirin EC  325 mg Oral Daily  . enoxaparin (LOVENOX) injection  40 mg Subcutaneous Q24H  . hydrochlorothiazide  25 mg Oral Daily  . lisinopril  20 mg Oral Daily   Continuous Infusions:   Time spent on care of this patient: 35  min   Gerald Cantor, MD  [facility] Office  [phone] Pager - Text Page per Loretha Stapler as per below:  On-Call/Text Page:      [email or web address]      password [email or web address]  If 7PM-7AM, please contact night-coverage [email or web address] Password [email or web address] 01/16/2013, 9:15 PM   LOS: 1 day

## 2013-01-16 NOTE — Progress Notes (Signed)
Stroke Team Progress Note  HISTORY HPI:  Gerald [name] is an 52 y.o. male with no past medical history who awoke Saturday AM and noted he was dizzy and having trouble with his gait. HE seemed to list to the right. He did not seek medical atttention at that time. He continued to have symptoms through Sunday into Monday AM. He visited his sister who tool his BP this am and noted it was 173/102 and brought him to urgent care. They told him to go to [name] hospital for hypertensive urgency.Marland Kitchen HIs BP has ranged from 194-214/ 105-122. Currently he is awake and alert but when he walks he shows some weakness in his right leg.  Date last known well: Date: 12/14/2012  Time last known well: Unable to determine  tPA Given: No: out of window   Patient was not a TPA candidate secondary to unknown LKW. He was admitted to the neuro floor for further evaluation and treatment.  SUBJECTIVE  Lying in bed. Family at bedside. Feels like symptoms are rapidly improving.    OBJECTIVE Most recent Vital Signs: Filed Vitals:   01/15/13 2021 01/15/13 2328 01/16/13 0504 01/16/13 0700  BP: 188/110 161/102 160/92   Pulse: 84 71 69   Temp: 98.3 F (36.8 C) 98.5 F (36.9 C) 98.4 F (36.9 C) 98.6 F (37 C)  TempSrc: Oral Oral Oral Oral  Resp: 22 19 17    Height: 6' (1.829 m)     Weight: 75.6 kg (166 lb 10.7 oz)     SpO2: 100% 100% 94%    CBG (last 3)   Recent Labs  01/15/13 1416  GLUCAP 80    IV Fluid Intake:     MEDICATIONS  . enoxaparin (LOVENOX) injection  40 mg Subcutaneous Q24H  . pneumococcal 23 valent vaccine  0.5 mL Intramuscular Tomorrow-1000   PRN:  senna-docusate  Diet:  Cardiac thin liquids Activity:  Bedrest  DVT Prophylaxis:  lovenox  CLINICALLY SIGNIFICANT STUDIES Basic Metabolic Panel:  Recent Labs Lab 01/15/13 1432 01/15/13 2129  NA 139  --   K 3.7  --   CL 103  --   GLUCOSE 95  --   BUN 6  --   CREATININE 1.10 0.99   Liver Function Tests: No results found for this  basename: AST, ALT, ALKPHOS, BILITOT, PROT, ALBUMIN,  in the last 168 hours CBC:  Recent Labs Lab 01/15/13 1423 01/15/13 1432 01/15/13 2129  WBC 5.3  --  6.4  NEUTROABS 2.2  --   --   HGB 14.3 16.0 14.0  HCT 42.9 47.0 41.3  MCV 96.8  --  96.3  PLT 217  --  229   Coagulation:  Recent Labs Lab 01/15/13 1423  LABPROT 14.1  INR 1.11   Cardiac Enzymes:  Recent Labs Lab 01/15/13 1423  TROPONINI <0.30   Urinalysis:  Recent Labs Lab 01/15/13 1538  COLORURINE YELLOW  LABSPEC 1.014  PHURINE 5.5  GLUCOSEU NEGATIVE  HGBUR NEGATIVE  BILIRUBINUR NEGATIVE  KETONESUR NEGATIVE  PROTEINUR NEGATIVE  UROBILINOGEN 0.2  NITRITE NEGATIVE  LEUKOCYTESUR NEGATIVE   Lipid Panel    Component Value Date/Time   CHOL 145 01/16/2013 0440   TRIG 61 01/16/2013 0440   HDL 71 01/16/2013 0440   CHOLHDL 2.0 01/16/2013 0440   VLDL 12 01/16/2013 0440   LDLCALC 62 01/16/2013 0440   HgbA1C  No results found for this basename: HGBA1C    Urine Drug Screen:     Component Value Date/Time   LABOPIA  NONE DETECTED 01/15/2013 1538   COCAINSCRNUR POSITIVE* 01/15/2013 1538   LABBENZ NONE DETECTED 01/15/2013 1538   AMPHETMU NONE DETECTED 01/15/2013 1538   THCU NONE DETECTED 01/15/2013 1538   LABBARB NONE DETECTED 01/15/2013 1538    Alcohol Level: No results found for this basename: ETH,  in the last 168 hours  Dg Chest 2 View 01/15/2013   No acute disease  Ct Head (brain) Wo Contrast 01/15/2013   Subacute infarct on the left basal ganglia.  No acute abnormality is noted.      MRI of the brain    MRA of the brain    2D Echocardiogram  EF 55%.no abnormal wall motion, left atrium normal in size  Carotid Doppler  Bilateral: 1-39% ICA stenosis. Vertebral artery flow is antegrade  EKG  normal sinus rhythm.   Therapy Recommendations NONE  Physical Exam   Pleasant middle-age male currently not in distress .Awake alert. Afebrile. Head is nontraumatic. Neck is supple without bruit. Hearing is normal. Cardiac exam no  murmur or gallop. Lungs are clear to auscultation. Distal pulses are well felt. Neurological Exam : Awake alert oriented x3 with normal speech and language function. Fundi were not visualized. Vision is daily and fields appear normal. There is minimum right lower facial asymmetry. Tongue is midline. Motor system exam reveals no upper or lower extremity drift but finds movements are diminished on the right. Is mild right grip weakness. Orbits left-over-right upper extremity. Mild weakness of right hip flexors and ankle dorsiflexors. Deep tendon reflexes are 1+ symmetric. Plantars are downgoing. Sensation appears intact coordination is slow but accurate. Gait was not tested ASSESSMENT Gerald [name] is a 52 y.o. male presenting with vertigo and ataxia. Imaging confirms a left basal ganglia infarct. Infarct felt to be thrombotic due to small vessel disease.  On no antithrombotics prior to admission. Now on aspirin 325mg  daily for secondary stroke prevention. Patient with resultant ataxia, transient. Work up underway.   Malignant hypertension  Cocaine abuse  Hospital day # 1  TREATMENT/PLAN  Continue aspirin 325 mg orally every day for secondary stroke prevention.  Counseling on cessation of drug abuse and potential for vasoconstriction, thus causing clot formation.  Risk factor modification  Gerald [name]. Gerald [name], Gerald [name], MBA, MHA Gerald [name] Stroke [name] Pager: [phone] 01/16/2013 4:45 PM  I have personally obtained a history, examined the patient, evaluated imaging results, and formulated the assessment and plan of care. I agree with the above. Delia Heady, MD

## 2013-01-16 NOTE — Progress Notes (Signed)
Physical Therapy Evaluation and D/C Patient Details Name: Gerald [name] MRN: [id number] DOB: 1960-11-08 Today's Date: 01/16/2013 Time: 1478-2956 PT Time Calculation (min): 25 min  PT Assessment / Plan / Recommendation History of Present Illness  Pt admit with HTN and left CVA.   Clinical Impression  Pt admitted with CVA with resolution of symptoms per assessment today . Pt currently without physical  functional limitations even with challenges to balance.  Pt will not need skilled PT as he is fully functional currently.  Will sign off.      PT Assessment  Patent does not need any further PT services    Follow Up Recommendations  No PT follow up                Equipment Recommendations  None recommended by PT               Precautions / Restrictions Precautions Precautions: None Restrictions Weight Bearing Restrictions: No   Pertinent Vitals/Pain VSS, HA slight per pt      Mobility  Bed Mobility Bed Mobility: Rolling Right;Right Sidelying to Sit Rolling Right: 7: Independent Right Sidelying to Sit: 7: Independent Transfers Transfers: Sit to Stand;Stand to Sit Sit to Stand: 7: Independent Stand to Sit: 7: Independent Ambulation/Gait Ambulation/Gait Assistance: 7: Independent Ambulation Distance (Feet): 400 Feet Assistive device: None Ambulation/Gait Assistance Details: Pt ambulates without device without difficulty.  No LOB with challenges.   Gait Pattern: Within Functional Limits Stairs: No Wheelchair Mobility Wheelchair Mobility: No Modified Rankin (Stroke Patients Only) Pre-Morbid Rankin Score: No symptoms Modified Rankin: No symptoms      PT Goals(Current goals can be found in the care plan section)    Visit Information  Last PT Received On: 01/16/13 Assistance Needed: +1 History of Present Illness: Pt admit with HTN and left CVA.        Prior Functioning  Home Living Family/patient expects to be discharged to:: Private residence Living  Arrangements: Spouse/significant other Available Help at Discharge: Family;Available 24 hours/day Type of Home: Apartment Home Access: Stairs to enter Entrance Stairs-Number of Steps: 3 Entrance Stairs-Rails: Left Home Layout: One level Home Equipment: None Prior Function Level of Independence: Independent Communication Communication: No difficulties Dominant Hand: Right    Cognition  Cognition Arousal/Alertness: Awake/alert Behavior During Therapy: WFL for tasks assessed/performed Overall Cognitive Status: Within Functional Limits for tasks assessed    Extremity/Trunk Assessment Upper Extremity Assessment Upper Extremity Assessment: Defer to OT evaluation Lower Extremity Assessment Lower Extremity Assessment: Overall WFL for tasks assessed Cervical / Trunk Assessment Cervical / Trunk Assessment: Normal   Balance Balance Balance Assessed: Yes Standardized Balance Assessment Standardized Balance Assessment: Dynamic Gait Index Dynamic Gait Index Level Surface: Normal Change in Gait Speed: Normal Gait with Horizontal Head Turns: Normal Gait with Vertical Head Turns: Normal Gait and Pivot Turn: Normal Step Over Obstacle: Normal Step Around Obstacles: Normal Steps: Normal Total Score: 24  End of Session PT - End of Session Equipment Utilized During Treatment: Gait belt Activity Tolerance: Patient tolerated treatment well Patient left: in chair;with call bell/phone within reach;with family/visitor present Nurse Communication: Mobility status      Gerald [name] 01/16/2013, 9:29 AM [facility] [phone] (pager)

## 2013-01-16 NOTE — Evaluation (Signed)
Speech Language Pathology Evaluation Patient Details Name: Gerald [name] MRN: [id number] DOB: 24-Sep-1960 Today's Date: 01/16/2013 Time: 0272-5366 SLP Time Calculation (min): 20 min  Problem List:  Patient Active Problem List   Diagnosis Date Noted  . CVA (cerebral infarction) 01/15/2013  . HTN (hypertension) 01/15/2013  . Nicotine dependence 01/15/2013  . Alcohol abuse, unspecified 01/15/2013   Past Medical History: History reviewed. No pertinent past medical history. Past Surgical History:  Past Surgical History  Procedure Laterality Date  . Skin graft  Left Leg    1980s   HPI:  52 y.o BM PMHx long-standing HTN, was not visited a physician and a significant amount of time, no PCP. Awoke Saturday AM and noted he was dizzy and having trouble with his gait.  He did not seek medical atttention at that time. He continued to have symptoms through Sunday into Monday AM. He visited his sister who took his BP this am and noted it was 173/102 and brought him to urgent care. Currently he is awake and alert but when he walks he shows some weakness in his right leg.  CT revealed subacute infarct on the left as described. No acute abnormality is noted.    Assessment / Plan / Recommendation Clinical Impression  Assessment of pt.'s speech-language-cognitive abilities did not reveal impairments for items evaluated.  Fiance present who confirms speech intelligibility is at baseline status.  Able to write without difficulty.  No ST needed at this time.    SLP Assessment  Patient does not need any further Speech Lanaguage Pathology Services    Follow Up Recommendations  None    Frequency and Duration        Pertinent Vitals/Pain none       SLP Evaluation Prior Functioning  Cognitive/Linguistic Baseline: Within functional limits Type of Home: Apartment Available Help at Discharge: Family;Available 24 hours/day Vocation: Full time employment (works at [employer])   Cognition  Overall  Cognitive Status: Within Functional Limits for tasks assessed Safety/Judgment: Appears intact    Comprehension  Auditory Comprehension Overall Auditory Comprehension: Appears within functional limits for tasks assessed Visual Recognition/Discrimination Discrimination: Not tested Reading Comprehension Reading Status: Not tested    Expression Expression Primary Mode of Expression: Verbal Verbal Expression Overall Verbal Expression: Appears within functional limits for tasks assessed Level of Generative/Spontaneous Verbalization: Conversation Pragmatics: No impairment Written Expression Dominant Hand: Right Written Expression: Within Functional Limits   Oral / Motor Oral Motor/Sensory Function Overall Oral Motor/Sensory Function: Appears within functional limits for tasks assessed Motor Speech Overall Motor Speech: Appears within functional limits for tasks assessed Intelligibility: Intelligible Motor Planning: Witnin functional limits   GO     Breck Coons [employer].Ed [employer] [id number]  01/16/2013

## 2013-01-16 NOTE — Progress Notes (Signed)
Report called to [facility], receiving RN on 3W.  Pt transferred to 3W18 via wheelchair with all belongings. Pt called girlfriend with new room number.    Roselie Awkward, RN

## 2013-01-16 NOTE — Progress Notes (Signed)
Pt noted with slight Neuro changes at 0500. Left hand grip has decreased from strong to moderate and slight Left Pronator drift also noted. Pt is otherwise intact, alert and oriented lying in bed talking with family. Vitals are unchanged, pt with no complaints. Notified Maren Reamer, NP and Dr Leroy Kennedy (Neuro), no new orders received. Will continue monitoring

## 2013-01-16 NOTE — Evaluation (Signed)
Occupational Therapy Evaluation Patient Details Name: Gerald [name] MRN: [id number] DOB: 1960-09-20 Today's Date: 01/16/2013 Time: 0347-4259 OT Time Calculation (min): 14 min  OT Assessment / Plan / Recommendation History of present illness Pt admit with HTN and left CVA.    Clinical Impression   Pt demonstrates independence in ADL and mobility and reports all symptoms have resolved.  No further OT needs.    OT Assessment  Patient does not need any further OT services    Follow Up Recommendations  No OT follow up    Barriers to Discharge      Equipment Recommendations  None recommended by OT    Recommendations for Other Services    Frequency       Precautions / Restrictions Precautions Precautions: None Restrictions Weight Bearing Restrictions: No   Pertinent Vitals/Pain No pain, vitals monitored throughout.    ADL  Eating/Feeding: Independent Where Assessed - Eating/Feeding: Edge of bed Grooming: Wash/dry hands;Independent Where Assessed - Grooming: Unsupported standing Upper Body Bathing: Independent Where Assessed - Upper Body Bathing: Unsupported standing Lower Body Bathing: Independent Where Assessed - Lower Body Bathing: Unsupported standing Upper Body Dressing: Independent Where Assessed - Upper Body Dressing: Unsupported sitting Lower Body Dressing: Independent Where Assessed - Lower Body Dressing: Supported sitting;Unsupported sit to stand Toilet Transfer: Community education officer Method: Sit to Barista: Regular height toilet Toileting - Clothing Manipulation and Hygiene: Independent Where Assessed - Toileting Clothing Manipulation and Hygiene: Sit to stand from 3-in-1 or toilet Transfers/Ambulation Related to ADLs: independent    OT Diagnosis:    OT Problem List:   OT Treatment Interventions:     OT Goals(Current goals can be found in the care plan section) Acute Rehab OT Goals Patient Stated Goal: return home  Visit  Information  Last OT Received On: 01/16/13 Assistance Needed: +1 History of Present Illness: Pt admit with HTN and left CVA.        Prior Functioning     Home Living Family/patient expects to be discharged to:: Private residence Living Arrangements: Spouse/significant other Available Help at Discharge: Family;Available 24 hours/day Type of Home: Apartment Home Access: Stairs to enter Entrance Stairs-Number of Steps: 3 Entrance Stairs-Rails: Left Home Layout: One level Home Equipment: None Prior Function Level of Independence: Independent Communication Communication: No difficulties Dominant Hand: Right         Vision/Perception Vision - History Baseline Vision: No visual deficits Patient Visual Report: No change from baseline   Cognition  Cognition Arousal/Alertness: Awake/alert Behavior During Therapy: WFL for tasks assessed/performed Overall Cognitive Status: Within Functional Limits for tasks assessed    Extremity/Trunk Assessment Upper Extremity Assessment Upper Extremity Assessment: Overall WFL for tasks assessed Cervical / Trunk Assessment Cervical / Trunk Assessment: Normal     Mobility Bed Mobility Bed Mobility: Rolling Right;Right Sidelying to Sit Rolling Right: 7: Independent Right Sidelying to Sit: 7: Independent Transfers Sit to Stand: 7: Independent Stand to Sit: 7: Independent     Exercise     Balance     End of Session OT - End of Session Activity Tolerance: Patient tolerated treatment well Patient left: in bed;with call bell/phone within reach  GO     Evern Bio 01/16/2013, 2:32 PM [phone]

## 2013-01-16 NOTE — Progress Notes (Signed)
VASCULAR LAB PRELIMINARY  PRELIMINARY  PRELIMINARY  PRELIMINARY  Carotid duplex  completed.    Preliminary report:  Bilateral:  1-39% ICA stenosis.  Vertebral artery flow is antegrade.      Maye Parkinson, RVT 01/16/2013, 12:39 PM

## 2013-01-17 MED ORDER — ASPIRIN 325 MG PO TBEC: 325.0000 mg | DELAYED_RELEASE_TABLET | Freq: Every day | ORAL | Status: DC

## 2013-01-17 MED ORDER — LISINOPRIL 20 MG PO TABS: 20.0000 mg | ORAL_TABLET | Freq: Every day | ORAL | Status: DC

## 2013-01-17 MED ORDER — SENNOSIDES-DOCUSATE SODIUM 8.6-50 MG PO TABS: 1.0000 | ORAL_TABLET | Freq: Every evening | ORAL | Status: DC | PRN

## 2013-01-17 MED ORDER — HYDROCHLOROTHIAZIDE 25 MG PO TABS: 25.0000 mg | ORAL_TABLET | Freq: Every day | ORAL | Status: DC

## 2013-01-17 NOTE — Care Management (Signed)
Care Manager provided pt with the information to the [facility] and [facility]. Pt will call for appointment. No further needs from CM at this time. Gala Lewandowsky , RN,BSN [phone].

## 2013-01-17 NOTE — Progress Notes (Signed)
Stroke Team Progress Note  HISTORY HPI:  Gerald [name] is an 52 y.o. male with no past medical history who awoke Saturday AM and noted he was dizzy and having trouble with his gait. HE seemed to list to the right. He did not seek medical atttention at that time. He continued to have symptoms through Sunday into Monday AM. He visited his sister who tool his BP this am and noted it was 173/102 and brought him to urgent care. They told him to go to [facility] hospital for hypertensive urgency.Marland Kitchen HIs BP has ranged from 194-214/ 105-122. Currently he is awake and alert but when he walks he shows some weakness in his right leg.  Date last known well: Date: 12/14/2012  Time last known well: Unable to determine  tPA Given: No: out of window   Patient was not a TPA candidate secondary to unknown LKW. He was admitted to the neuro floor for further evaluation and treatment.  SUBJECTIVE  Lying in bed. Family at bedside. Feels like symptoms have resolved. Patient feels motivated to stop drug use    OBJECTIVE Most recent Vital Signs: Filed Vitals:   01/16/13 2000 01/17/13 0000 01/17/13 0401 01/17/13 0800  BP:  140/93 142/86 152/77  Pulse: 76  73 67  Temp:   98.1 F (36.7 C) 98.6 F (37 C)  TempSrc:   Oral Oral  Resp: 20  18 18   Height:      Weight:      SpO2:   100% 99%   CBG (last 3)   Recent Labs  01/15/13 1416  GLUCAP 80    IV Fluid Intake:     MEDICATIONS  . aspirin EC  325 mg Oral Daily  . enoxaparin (LOVENOX) injection  40 mg Subcutaneous Q24H  . hydrochlorothiazide  25 mg Oral Daily  . lisinopril  20 mg Oral Daily   PRN:  senna-docusate  Diet:  Cardiac thin liquids Activity:  Bedrest  DVT Prophylaxis:  lovenox  CLINICALLY SIGNIFICANT STUDIES Basic Metabolic Panel:   Recent Labs Lab 01/15/13 1432 01/15/13 2129  NA 139  --   K 3.7  --   CL 103  --   GLUCOSE 95  --   BUN 6  --   CREATININE 1.10 0.99   Liver Function Tests: No results found for this basename: AST,  ALT, ALKPHOS, BILITOT, PROT, ALBUMIN,  in the last 168 hours CBC:   Recent Labs Lab 01/15/13 1423 01/15/13 1432 01/15/13 2129  WBC 5.3  --  6.4  NEUTROABS 2.2  --   --   HGB 14.3 16.0 14.0  HCT 42.9 47.0 41.3  MCV 96.8  --  96.3  PLT 217  --  229   Coagulation:   Recent Labs Lab 01/15/13 1423  LABPROT 14.1  INR 1.11   Cardiac Enzymes:   Recent Labs Lab 01/15/13 1423  TROPONINI <0.30   Urinalysis:   Recent Labs Lab 01/15/13 1538  COLORURINE YELLOW  LABSPEC 1.014  PHURINE 5.5  GLUCOSEU NEGATIVE  HGBUR NEGATIVE  BILIRUBINUR NEGATIVE  KETONESUR NEGATIVE  PROTEINUR NEGATIVE  UROBILINOGEN 0.2  NITRITE NEGATIVE  LEUKOCYTESUR NEGATIVE   Lipid Panel    Component Value Date/Time   CHOL 145 01/16/2013 0440   TRIG 61 01/16/2013 0440   HDL 71 01/16/2013 0440   CHOLHDL 2.0 01/16/2013 0440   VLDL 12 01/16/2013 0440   LDLCALC 62 01/16/2013 0440   HgbA1C  Lab Results  Component Value Date   HGBA1C 5.1 01/16/2013  Urine Drug Screen:     Component Value Date/Time   LABOPIA NONE DETECTED 01/15/2013 1538   COCAINSCRNUR POSITIVE* 01/15/2013 1538   LABBENZ NONE DETECTED 01/15/2013 1538   AMPHETMU NONE DETECTED 01/15/2013 1538   THCU NONE DETECTED 01/15/2013 1538   LABBARB NONE DETECTED 01/15/2013 1538    Alcohol Level: No results found for this basename: ETH,  in the last 168 hours  Dg Chest 2 View 01/15/2013   No acute disease  Ct Head (brain) Wo Contrast 01/15/2013   Subacute infarct on the left basal ganglia.  No acute abnormality is noted.      MRI of the brain Acute ischemic infarcts involving the right periventricular white matter of the right frontal lobe as detailed above.  2. Remote lacunar left basal ganglia and left thalamic infarcts. 3. Atrophy with severe leukoariosis     MRA of the brain Multifocal irregularity involving the cavernous internal carotid arteries as well as the A1 and M1 segments bilaterally, likely  related to underlying atherosclerotic disease. No  high-grade stenosis or aneurysm identified.      2D Echocardiogram  EF 55%.no abnormal wall motion, left atrium normal in size  Carotid Doppler  Bilateral: 1-39% ICA stenosis. Vertebral artery flow is antegrade  EKG  normal sinus rhythm.   Therapy Recommendations NONE  Physical Exam   Pleasant middle-age male currently not in distress .Awake alert. Afebrile. Head is nontraumatic. Neck is supple without bruit. Hearing is normal. Cardiac exam no murmur or gallop. Lungs are clear to auscultation. Distal pulses are well felt. Neurological Exam : Awake alert oriented x3 with normal speech and language function. Fundi were not visualized. Vision acuity and fields appear normal. There is minimum right lower facial asymmetry. Tongue is midline. Motor system exam reveals no upper or lower extremity drift but finds movements are diminished on the right. Is mild right grip weakness. Orbits left-over-right upper extremity. Mild weakness of right hip flexors and ankle dorsiflexors. Deep tendon reflexes are 1+ symmetric. Plantars are downgoing. Sensation appears intact coordination is slow but accurate. Gait was not tested  ASSESSMENT Mr. Gerald [name] is a 52 y.o. male presenting with vertigo and ataxia. Imaging confirms a left basal ganglia infarct. Infarct felt to be thrombotic due to small vessel disease.  On no antithrombotics prior to admission. Now on aspirin 325mg  daily for secondary stroke prevention. Patient with resultant ataxia, transient. Work up underway.   Malignant hypertension  Cocaine abuse  Hospital day # 2  TREATMENT/PLAN  Continue aspirin 325 mg orally every day for secondary stroke prevention.  Counseling on cessation of drug abuse and potential for vasoconstriction, thus causing clot formation.  Risk factor modification  Stroke service to sign off.  Have patient follow up with Dr. Pearlean Brownie in 2 months.  Gwendolyn [name]. [name], [facility], MBA, MHA [facility] Pager:  [phone] 01/17/2013 9:20 AM I have personally obtained a history, examined the patient, evaluated imaging results, and formulated the assessment and plan of care. I agree with the above.  Delia Heady, MD

## 2013-01-17 NOTE — Discharge Summary (Signed)
Physician Discharge Summary  Gerald [name]:[id number] DOB: June 19, 1960 DOA: 01/15/2013  PCP: No PCP Per Patient  Admit date: 01/15/2013 Discharge date: 01/17/2013  Time spent: >30 minutes  Recommendations for Outpatient Follow-up:      Follow-up Information   Please follow up. (PCP in 1-2weeks, call for appt upon discharge)       Follow up with SETHI,PRAMODKUMAR P, MD In 2 months. (call for appt upon discharge)    Contact information:   [address] [place] [address] [phone]       Discharge Diagnoses:  Principal Problem:   CVA (cerebral infarction) Active Problems:   HTN (hypertension)   Nicotine dependence   Alcohol abuse, unspecified   Discharge Condition:improved/stable  Diet recommendation: heart healthy  Filed Weights   01/15/13 1358 01/15/13 2021 01/16/13 1827  Weight: 72.576 kg (160 lb) 75.6 kg (166 lb 10.7 oz) 77.021 kg (169 lb 12.8 oz)    History of present illness:  Gerald [name] is an 52 y.o BM PMHx long-standing HTN, was not visited a physician and a significant amount of time, no PCP. Awoke Saturday AM and noted he was dizzy and having trouble with his gait. HE seemed to list to the right. He did not seek medical atttention at that time. He continued to have symptoms through Sunday into Monday AM. He visited his sister who took his BP this am and noted it was 173/102 and brought him to urgent care. They told him to go to [facility] hospital for hypertensive urgency.Marland Kitchen HIs BP has ranged from 194-214/ 105-122. Currently he is awake and alert but when he walks he shows some weakness in his right leg.    Hospital Course:  Principal Problem:  CVA (cerebral infarction)  - CT on admission showed Subacute infarct involving the anterior aspect of the internal capsule  on the left as well as the left basal ganglia.  -MRI >>Acute ischemic infarcts involving the right periventricular  white matter of the right frontal lobe  -carotid dopplers and  ECHO- showed results as below - he was placed on ASA - neuro was consulted and PT/OT followed pt Active Problems:  HTN (hypertension)  - permissive HTN allowed initially and then BP meds were started to optimize BP CONTROL - He is to continue lisinopril and hctz on d/c Nicotine dependence  - has been advised to quit  Cocaine abuse  - advised to quit and informed of relationship with CVA  Alcohol abuse, unspecified  - also advised to quit.    Procedures:  echo Study Conclusions  - Left ventricle: The cavity size was normal. Wall thickness was increased in a pattern of mild LVH. The estimated ejection fraction was 55%. Wall motion was normal; there were no regional wall motion abnormalities. Doppler parameters are consistent with abnormal left ventricular relaxation (grade 1 diastolic dysfunction). - Aortic valve: There was no stenosis. - Aorta: Borderline dilated aortic root. Aortic root dimension: 37mm (ED). - Mitral valve: No significant regurgitation. - Right ventricle: The cavity size was normal. Systolic function was normal. - Pulmonary arteries: PA peak pressure: 21mm Hg (S). - Inferior vena cava: The vessel was normal in size; the respirophasic diameter changes were in the normal range (= 50%); findings are consistent with normal central venous pressure.  Carotid doppler  - The vertebral arteries appear patent with antegrade flow. - Findings consistent with1-39 percent stenosis involving the right internal carotid artery and the left internal carotid artery.   Consultations:  neuro  Discharge Exam: Filed Vitals:   01/17/13 1001  BP: 154/99  Pulse:   Temp:   Resp:    Exam:  General: alert & oriented x 3 In NAD Cardiovascular: RRR, nl S1 s2 Respiratory: CTAB Abdomen: soft +BS NT/ND, no masses palpable Extremities: No cyanosis and no edema Neuro: Alert and oriented x3 cranial nerves 2-12 grossly intact strength 5/5 and symmetric  nonfocal.    Discharge Instructions  Discharge Orders   Future Orders Complete By Expires     Diet - low sodium heart healthy  As directed     Increase activity slowly  As directed         Medication List         aspirin 325 MG EC tablet  Take 1 tablet (325 mg total) by mouth daily.     hydrochlorothiazide 25 MG tablet  Commonly known as:  HYDRODIURIL  Take 1 tablet (25 mg total) by mouth daily.     lisinopril 20 MG tablet  Commonly known as:  PRINIVIL,ZESTRIL  Take 1 tablet (20 mg total) by mouth daily.     senna-docusate 8.6-50 MG per tablet  Commonly known as:  Senokot-S  Take 1 tablet by mouth at bedtime as needed.       No Known Allergies     Follow-up Information   Please follow up. (PCP in 1-2weeks, call for appt upon discharge)       Follow up with SETHI,PRAMODKUMAR P, MD In 2 months. (call for appt upon discharge)    Contact information:   [address] [place] [address] [phone]        The results of significant diagnostics from this hospitalization (including imaging, microbiology, ancillary and laboratory) are listed below for reference.    Significant Diagnostic Studies: Dg Chest 2 View  01/15/2013   *RADIOLOGY REPORT*  Clinical Data: Hypertension.  CHEST - 2 VIEW  Comparison: None.  Findings: Lungs are clear.  Heart size is normal.  No pneumothorax or pleural effusion.  No focal bony abnormality.  IMPRESSION: No acute disease.   Original Report Authenticated By: Holley Dexter, M.D.   Ct Head (brain) Wo Contrast  01/15/2013   *RADIOLOGY REPORT*  Clinical Data: Hypertension and balance difficulties  CT HEAD WITHOUT CONTRAST  Technique:  Contiguous axial images were obtained from the base of the skull through the vertex without contrast.  Comparison: None.  Findings: Bony calvarium is intact.  There is an area of decreased attenuation involving the anterior aspect of the internal capsule on the left as well as the left basal  ganglia.  This is consistent with a subacute infarct.  No acute hemorrhage, acute infarction or space-occupying mass lesion is noted.  IMPRESSION: Subacute infarct on the left as described.  No acute abnormality is noted.   Original Report Authenticated By: Alcide Clever, M.D.   Mr Brain Wo Contrast  01/17/2013   *RADIOLOGY REPORT*  Clinical Data:  Stroke  MRI HEAD WITHOUT CONTRAST MRA HEAD WITHOUT CONTRAST  Technique:  Multiplanar, multiecho pulse sequences of the brain and surrounding structures were obtained without intravenous contrast. Angiographic images of the head were obtained using MRA technique without contrast.  Comparison:  CT from 01/15/2013  MRI HEAD  Findings:  There is a focus of restricted diffusion measuring 1.9 x 1.2 cm in the right periventricular white matter of the right frontal lobe (series 4, image 18), consistent with acute infarct. Small 3 mm focus is seen adjacent (series 4, image 16).  There is corresponding signal dropout on the ADC map.  Extensive scattered and confluent foci of T2 / FLAIR hyperintensity are seen within the periventricular and deep white matter, consistent with chronic small vessel ischemic changes.  Remote lacunar infarcts are seen within the basal ganglia and left thalamus.  Small vessel disease involves the brainstem as well including the mid brain, pons, and medulla. There is diffuse prominence of the CSF containing spaces consistent with generalized atrophy.  There is no midline shift or mass lesion.  No extra-axial fluid collection.  Calvarium is normal.  Orbital soft tissues are within normal limits.  IMPRESSION: 1.  Acute ischemic infarcts involving the right periventricular white matter of the right frontal lobe as detailed above. 2. Remote lacunar left basal ganglia and left thalamic infarcts. 3.  Atrophy with severe leukoariosis.  MRA HEAD  Findings: There is multifocal irregularity of the cavernous segment of the internal carotid arteries bilaterally.   Diffuse irregularity is seen in both M1 segments as well as the A1 segments.  No definite high-grade stenosis is identified.  There is no aneurysm. The right vertebral arteries and basilar artery are normal. Diffuse narrowing of the left posterior cerebral artery as compared to the right.  IMPRESSION: Multifocal irregularity involving the cavernous internal carotid arteries as well as the A1 and M1 segments bilaterally, likely related to underlying atherosclerotic disease.  No high-grade stenosis or aneurysm identified.   Original Report Authenticated By: Rise Mu, M.D.   Mr Mra Head/brain Wo Cm  01/17/2013   *RADIOLOGY REPORT*  Clinical Data:  Stroke  MRI HEAD WITHOUT CONTRAST MRA HEAD WITHOUT CONTRAST  Technique:  Multiplanar, multiecho pulse sequences of the brain and surrounding structures were obtained without intravenous contrast. Angiographic images of the head were obtained using MRA technique without contrast.  Comparison:  CT from 01/15/2013  MRI HEAD  Findings:  There is a focus of restricted diffusion measuring 1.9 x 1.2 cm in the right periventricular white matter of the right frontal lobe (series 4, image 18), consistent with acute infarct. Small 3 mm focus is seen adjacent (series 4, image 16).  There is corresponding signal dropout on the ADC map.  Extensive scattered and confluent foci of T2 / FLAIR hyperintensity are seen within the periventricular and deep white matter, consistent with chronic small vessel ischemic changes.  Remote lacunar infarcts are seen within the basal ganglia and left thalamus.  Small vessel disease involves the brainstem as well including the mid brain, pons, and medulla. There is diffuse prominence of the CSF containing spaces consistent with generalized atrophy.  There is no midline shift or mass lesion.  No extra-axial fluid collection.  Calvarium is normal.  Orbital soft tissues are within normal limits.  IMPRESSION: 1.  Acute ischemic infarcts involving  the right periventricular white matter of the right frontal lobe as detailed above. 2. Remote lacunar left basal ganglia and left thalamic infarcts. 3.  Atrophy with severe leukoariosis.  MRA HEAD  Findings: There is multifocal irregularity of the cavernous segment of the internal carotid arteries bilaterally.  Diffuse irregularity is seen in both M1 segments as well as the A1 segments.  No definite high-grade stenosis is identified.  There is no aneurysm. The right vertebral arteries and basilar artery are normal. Diffuse narrowing of the left posterior cerebral artery as compared to the right.  IMPRESSION: Multifocal irregularity involving the cavernous internal carotid arteries as well as the A1 and M1 segments bilaterally, likely related to underlying atherosclerotic disease.  No  high-grade stenosis or aneurysm identified.   Original Report Authenticated By: Rise Mu, M.D.    Microbiology: Recent Results (from the past 240 hour(s))  MRSA PCR SCREENING     Status: None   Collection Time    01/15/13 11:59 PM      Result Value Range Status   MRSA by PCR NEGATIVE  NEGATIVE Final   Comment:            The GeneXpert MRSA Assay (FDA     approved for NASAL specimens     only), is one component of a     comprehensive MRSA colonization     surveillance program. It is not     intended to diagnose MRSA     infection nor to guide or     monitor treatment for     MRSA infections.     Labs: Basic Metabolic Panel:  Recent Labs Lab 01/15/13 1432 01/15/13 2129  NA 139  --   K 3.7  --   CL 103  --   GLUCOSE 95  --   BUN 6  --   CREATININE 1.10 0.99   Liver Function Tests: No results found for this basename: AST, ALT, ALKPHOS, BILITOT, PROT, ALBUMIN,  in the last 168 hours No results found for this basename: LIPASE, AMYLASE,  in the last 168 hours No results found for this basename: AMMONIA,  in the last 168 hours CBC:  Recent Labs Lab 01/15/13 1423 01/15/13 1432  01/15/13 2129  WBC 5.3  --  6.4  NEUTROABS 2.2  --   --   HGB 14.3 16.0 14.0  HCT 42.9 47.0 41.3  MCV 96.8  --  96.3  PLT 217  --  229   Cardiac Enzymes:  Recent Labs Lab 01/15/13 1423  TROPONINI <0.30   BNP: BNP (last 3 results) No results found for this basename: PROBNP,  in the last 8760 hours CBG:  Recent Labs Lab 01/15/13 1416  GLUCAP 80       Signed:  Kveon Casanas C  [facility] 01/17/2013, 12:13 PM

## 2013-01-17 NOTE — ED Provider Notes (Signed)
I saw and evaluated the patient, reviewed the resident's note and I agree with the findings and plan. Pt referred from urgent care for gait disturbance x 2 days. Pt with no focal weakness, numbness. Persistent elevated BP. MRI with subacute infarct. Will admit for further workup.   Loren Racer, MD 01/17/13 2152

## 2013-02-19 ENCOUNTER — Other Ambulatory Visit: Payer: Self-pay | Admitting: Internal Medicine

## 2013-02-20 NOTE — Telephone Encounter (Signed)
Not a pt of ours. Need to schedule appt new establish

## 2015-07-02 ENCOUNTER — Emergency Department (HOSPITAL_COMMUNITY): Payer: Medicaid Other

## 2015-07-02 ENCOUNTER — Inpatient Hospital Stay (HOSPITAL_COMMUNITY)
Admission: EM | Admit: 2015-07-02 | Discharge: 2015-07-08 | DRG: 065 | Disposition: A | Discharge status: Rehabilitation Facility | Payer: Medicaid Other | Attending: Internal Medicine | Admitting: Internal Medicine

## 2015-07-02 ENCOUNTER — Encounter (HOSPITAL_COMMUNITY): Payer: Self-pay | Admitting: *Deleted

## 2015-07-02 DIAGNOSIS — Z8249 Family history of ischemic heart disease and other diseases of the circulatory system: Secondary | ICD-10-CM | POA: Diagnosis not present

## 2015-07-02 DIAGNOSIS — Z8673 Personal history of transient ischemic attack (TIA), and cerebral infarction without residual deficits: Secondary | ICD-10-CM

## 2015-07-02 DIAGNOSIS — E785 Hyperlipidemia, unspecified: Secondary | ICD-10-CM | POA: Diagnosis present

## 2015-07-02 DIAGNOSIS — I160 Hypertensive urgency: Secondary | ICD-10-CM

## 2015-07-02 DIAGNOSIS — R471 Dysarthria and anarthria: Secondary | ICD-10-CM | POA: Diagnosis present

## 2015-07-02 DIAGNOSIS — I63522 Cerebral infarction due to unspecified occlusion or stenosis of left anterior cerebral artery: Secondary | ICD-10-CM | POA: Diagnosis not present

## 2015-07-02 DIAGNOSIS — I63512 Cerebral infarction due to unspecified occlusion or stenosis of left middle cerebral artery: Secondary | ICD-10-CM | POA: Diagnosis present

## 2015-07-02 DIAGNOSIS — R29718 NIHSS score 18: Secondary | ICD-10-CM | POA: Diagnosis present

## 2015-07-02 DIAGNOSIS — G8191 Hemiplegia, unspecified affecting right dominant side: Secondary | ICD-10-CM | POA: Diagnosis present

## 2015-07-02 DIAGNOSIS — F1410 Cocaine abuse, uncomplicated: Secondary | ICD-10-CM | POA: Diagnosis present

## 2015-07-02 DIAGNOSIS — F1090 Alcohol use, unspecified, uncomplicated: Secondary | ICD-10-CM | POA: Diagnosis present

## 2015-07-02 DIAGNOSIS — R1310 Dysphagia, unspecified: Secondary | ICD-10-CM | POA: Diagnosis present

## 2015-07-02 DIAGNOSIS — I10 Essential (primary) hypertension: Secondary | ICD-10-CM | POA: Diagnosis present

## 2015-07-02 DIAGNOSIS — I6789 Other cerebrovascular disease: Secondary | ICD-10-CM | POA: Diagnosis not present

## 2015-07-02 DIAGNOSIS — F17210 Nicotine dependence, cigarettes, uncomplicated: Secondary | ICD-10-CM | POA: Diagnosis present

## 2015-07-02 DIAGNOSIS — Z823 Family history of stroke: Secondary | ICD-10-CM | POA: Diagnosis not present

## 2015-07-02 DIAGNOSIS — R29810 Facial weakness: Secondary | ICD-10-CM | POA: Diagnosis present

## 2015-07-02 DIAGNOSIS — Z9119 Patient's noncompliance with other medical treatment and regimen: Secondary | ICD-10-CM

## 2015-07-02 DIAGNOSIS — R29715 NIHSS score 15: Secondary | ICD-10-CM | POA: Diagnosis not present

## 2015-07-02 DIAGNOSIS — K5900 Constipation, unspecified: Secondary | ICD-10-CM | POA: Diagnosis present

## 2015-07-02 DIAGNOSIS — F1010 Alcohol abuse, uncomplicated: Secondary | ICD-10-CM | POA: Diagnosis present

## 2015-07-02 DIAGNOSIS — I161 Hypertensive emergency: Secondary | ICD-10-CM | POA: Diagnosis present

## 2015-07-02 DIAGNOSIS — I639 Cerebral infarction, unspecified: Secondary | ICD-10-CM | POA: Diagnosis present

## 2015-07-02 DIAGNOSIS — F17200 Nicotine dependence, unspecified, uncomplicated: Secondary | ICD-10-CM | POA: Diagnosis present

## 2015-07-02 DIAGNOSIS — I63322 Cerebral infarction due to thrombosis of left anterior cerebral artery: Secondary | ICD-10-CM

## 2015-07-02 HISTORY — DX: Tobacco use: Z72.0

## 2015-07-02 HISTORY — DX: Cerebral infarction, unspecified: I63.9

## 2015-07-02 HISTORY — DX: Alcohol abuse, uncomplicated: F10.10

## 2015-07-02 HISTORY — DX: Essential (primary) hypertension: I10

## 2015-07-02 HISTORY — DX: Cocaine abuse, uncomplicated: F14.10

## 2015-07-02 LAB — I-STAT CHEM 8, ED
BUN: 11 mg/dL (ref 6–20)
Calcium, Ion: 1.16 mmol/L (ref 1.12–1.23)
Chloride: 103 mmol/L (ref 101–111)
Creatinine, Ser: 0.7 mg/dL (ref 0.61–1.24)
Glucose, Bld: 107 mg/dL — ABNORMAL HIGH (ref 65–99)
HCT: 50 % (ref 39.0–52.0)
Hemoglobin: 17 g/dL (ref 13.0–17.0)
Potassium: 3.9 mmol/L (ref 3.5–5.1)
Sodium: 139 mmol/L (ref 135–145)
TCO2: 21 mmol/L (ref 0–100)

## 2015-07-02 LAB — CBC
HCT: 44.5 % (ref 39.0–52.0)
Hemoglobin: 15 g/dL (ref 13.0–17.0)
MCH: 32.4 pg (ref 26.0–34.0)
MCHC: 33.7 g/dL (ref 30.0–36.0)
MCV: 96.1 fL (ref 78.0–100.0)
Platelets: 273 10*3/uL (ref 150–400)
RBC: 4.63 MIL/uL (ref 4.22–5.81)
RDW: 12.9 % (ref 11.5–15.5)
WBC: 13.4 10*3/uL — ABNORMAL HIGH (ref 4.0–10.5)

## 2015-07-02 LAB — DIFFERENTIAL
Basophils Absolute: 0 10*3/uL (ref 0.0–0.1)
Basophils Relative: 0 %
Eosinophils Absolute: 0 10*3/uL (ref 0.0–0.7)
Eosinophils Relative: 0 %
Lymphocytes Relative: 7 %
Lymphs Abs: 1 10*3/uL (ref 0.7–4.0)
Monocytes Absolute: 0.6 10*3/uL (ref 0.1–1.0)
Monocytes Relative: 4 %
Neutro Abs: 11.8 10*3/uL — ABNORMAL HIGH (ref 1.7–7.7)
Neutrophils Relative %: 89 %

## 2015-07-02 LAB — URINALYSIS, ROUTINE W REFLEX MICROSCOPIC
Bilirubin Urine: NEGATIVE
Glucose, UA: NEGATIVE mg/dL
Hgb urine dipstick: NEGATIVE
Ketones, ur: 40 mg/dL — AB
Leukocytes, UA: NEGATIVE
Nitrite: NEGATIVE
Protein, ur: NEGATIVE mg/dL
Specific Gravity, Urine: 1.019 (ref 1.005–1.030)
pH: 6 (ref 5.0–8.0)

## 2015-07-02 LAB — RAPID URINE DRUG SCREEN, HOSP PERFORMED
Amphetamines: NOT DETECTED
Barbiturates: NOT DETECTED
Benzodiazepines: NOT DETECTED
Cocaine: POSITIVE — AB
Opiates: NOT DETECTED
Tetrahydrocannabinol: NOT DETECTED

## 2015-07-02 LAB — COMPREHENSIVE METABOLIC PANEL
ALT: 23 U/L (ref 17–63)
AST: 32 U/L (ref 15–41)
Albumin: 3.9 g/dL (ref 3.5–5.0)
Alkaline Phosphatase: 57 U/L (ref 38–126)
Anion gap: 11 (ref 5–15)
BUN: 9 mg/dL (ref 6–20)
CO2: 21 mmol/L — ABNORMAL LOW (ref 22–32)
Calcium: 9.5 mg/dL (ref 8.9–10.3)
Chloride: 105 mmol/L (ref 101–111)
Creatinine, Ser: 0.9 mg/dL (ref 0.61–1.24)
GFR calc Af Amer: >60 mL/min (ref >60)
GFR calc non Af Amer: >60 mL/min (ref >60)
Glucose, Bld: 110 mg/dL — ABNORMAL HIGH (ref 65–99)
Potassium: 4 mmol/L (ref 3.5–5.1)
Sodium: 137 mmol/L (ref 135–145)
Total Bilirubin: 1.2 mg/dL (ref 0.3–1.2)
Total Protein: 7.1 g/dL (ref 6.5–8.1)

## 2015-07-02 LAB — APTT: aPTT: 27 seconds (ref 24–37)

## 2015-07-02 LAB — PROTIME-INR
INR: 1.17 (ref 0.00–1.49)
Prothrombin Time: 15.1 seconds (ref 11.6–15.2)

## 2015-07-02 LAB — I-STAT TROPONIN, ED: Troponin i, poc: 0.01 ng/mL (ref 0.00–0.08)

## 2015-07-02 LAB — ETHANOL: Alcohol, Ethyl (B): <5 mg/dL (ref <5)

## 2015-07-02 MED ORDER — LORAZEPAM 2 MG/ML IJ SOLN
1.0000 mg | Freq: Once | INTRAMUSCULAR | Status: AC
  Administered 2015-07-02: 1 mg via INTRAVENOUS
  Filled 2015-07-02: qty 1

## 2015-07-02 MED ORDER — SODIUM CHLORIDE 0.9 % IV SOLN
INTRAVENOUS | Status: DC
  Administered 2015-07-02: 22:00:00 via INTRAVENOUS

## 2015-07-02 MED ORDER — LABETALOL HCL 5 MG/ML IV SOLN
10.0000 mg | Freq: Once | INTRAVENOUS | Status: AC
  Administered 2015-07-02: 10 mg via INTRAVENOUS
  Filled 2015-07-02: qty 4

## 2015-07-02 NOTE — ED Notes (Signed)
Pt arrives from home via [facility]. Pt has right sided paralysis and facial droop. Speech clear. LSN last night at 8pm. Notice first changes with right sided weakness at 4am this morning.

## 2015-07-02 NOTE — ED Notes (Signed)
Gerald [name] (significant other)- [phone] Sherrine Maples (family friend)- [phone]

## 2015-07-02 NOTE — H&P (Signed)
History and Physical  Patient Name: Gerald [name]     [id number]    DOB: 11-26-1960    DOA: 07/02/2015 Referring physician: Francine Graven, MD PCP: No PCP Per Patient      Chief Complaint: Right sided weakness  HPI: Gerald [name] is a 55 y.o. male with a past medical history significant for HTN, smoking, and CVA in 2014 who presents with right sided weakness.  All history is collected from the chart, EMS, and the patient's sisters as the patient is unable to provide history present himself because of speech difficulty. Evidently he was in his normal state of health until he went to bed last night at which time he felt normal, and then he woke up this morning at 4 AM feeling right-sided weakness. His family found out in the morning and brought him to the emergency room.   In the ED, he was hypertensive greater than 200/120, coags were normal, troponin was normal, renal function was normal, and a CT head showed left-sided changes. A follow-up MRI MRA showed acute nonhemorrhagic CVA in the left ACA distribution affecting medial, superior frontal lobe, parasagittal frontal lobe, and cingulate gyrus.  Neurology evaluated the patient.  Code STROKE was not called because the patient presented well outside the tPA window.  TRH were asked to evaluate for admission.  After his discharge 2 years ago, the patient is not taking medicines for blood pressure. He still smokes. He drinks alcohol daily. He does not have regular PCP follow-up.     Review of Systems:  Unable to obtain due to patient dysarthria.  No Known Allergies  Prior to Admission medications   Medication Sig Start Date End Date Taking? Authorizing Provider  aspirin EC 325 MG EC tablet Take 1 tablet (325 mg total) by mouth daily. Patient not taking: Reported on 07/02/2015 01/17/13   Sheila Oats, MD  hydrochlorothiazide (HYDRODIURIL) 25 MG tablet Take 1 tablet (25 mg total) by mouth daily. Patient not taking: Reported on  07/02/2015 01/17/13   Sheila Oats, MD  lisinopril (PRINIVIL,ZESTRIL) 20 MG tablet Take 1 tablet (20 mg total) by mouth daily. Patient not taking: Reported on 07/02/2015 01/17/13   Sheila Oats, MD  senna-docusate (SENOKOT-S) 8.6-50 MG per tablet Take 1 tablet by mouth at bedtime as needed. Patient not taking: Reported on 07/02/2015 01/17/13   Sheila Oats, MD    Past Medical History  Diagnosis Date  . Hypertension   . Alcohol abuse   . Tobacco use   . Stroke ([place]) 2014  . Cocaine abuse 2014    Past Surgical History  Procedure Laterality Date  . Skin graft  Left Leg    1980s    Family history: family history includes Hypertension in his father and mother; Stroke in his father.  Social History: Patient lives with his girlfriend. He smokes. He drinks alcohol, reportedly from sister more than 4 tall beers per day. He has a history of cocaine use.  He works part time at [employer].       Physical Exam: BP 205/123 mmHg  Pulse 92  Resp 23  SpO2 91% General appearance: Well-developed, adult male, lying in bed, flat, appears diaphoretic and uncomfortable.   Eyes: Anicteric, conjunctiva pink, lids and lashes normal.     ENT: No nasal deformity, discharge, or epistaxis.  OP moist.   Lymph: No cervical lymphadenopathy. Skin: Warm and moist.  No suspicious rashes or lesions. Cardiac: RRR, nl S1-S2, no murmurs appreciated. No  carotid bruits. Respiratory: Normal respiratory rate and rhythm.  CTAB. Abdomen: Abdomen soft without rigidity.  No TTP.   MSK: No deformities or effusions. Neuro: Pupils are 4 mm and reactive to 3 mm. Extraocular movements are intact, without nystagmus. Cranial nerve 5 is within normal limits. There is right facial droop with smile. Cranial nerve 8 is within normal limits. Patient is too weak to open mouth for reliable cranial nerves 9 and 10 testing, but there seems to be equal palate elevation. Cranial nerve 11 reveals sternocleidomastoid strong.  Cranial nerve 12 is weak. There is increased tone on the right, and complete inability to move the right arm or leg.   Finger-to-nose testing appears normal in unaffected arm. Unable to test orientation to time, place and person. Speech is very dysarthric and patient unwilling or unable to utter any words. Psych: Somewhat agitated and tremulous.  Affect tearful.      Labs on Admission:  The metabolic panel shows normal sodium, potassium, bicarbonate, and renal function. Transaminases and bilirubin are normal. The PTT and INR are normal. The troponin is negative. The complete blood count shows mild leukocytosis.   Radiological Exams on Admission: CT head without contrast 07/02/2015  "IMPRESSION: 1. Ill-defined edema within the medial and anterior aspects of the left frontal lobe, extending inferiorly to the level of the left periventricular white matter and genu of the corpus callosum, compatible with the acute ACA distribution infarction identified on brain MRI performed just before this head CT. 2. Associated mild sulcal effacement. No midline shift or herniation. 3. No intracranial hemorrhage."     Mr Brain Wo Contrast (neuro Protocol) with MRA head 07/02/2015   "IMPRESSION: Acute nonhemorrhagic LEFT anterior cerebral artery territory infarction affecting the medial and anterior frontal lobe including the cingulate gyrus and regional white matter. 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 No large vessel occlusion is evident."   EKG: Independently reviewed. Sinus, rate 87. QTC 498. Diffuse concave ST elevations in early repolarization, similar to previous.   Echocardiogram 2014: Normal ejection fraction greater than 55%, borderline dilated aortic  root.  Carotid ultrasound 2014: Minimal right-sided carotid disease, nothing on the left.     Assessment/Plan   1. Acute Stroke/TIA:  This is new.  -Admit to telemetry -Neuro checks, NIHSS per protocol -Daily aspirin 325 mg -Lipids, hemoglobin A1c -Carotid doppler ordered -Echocardiogram ordered -PT/OT/SLP consultation -Consult to Neurology, appreciate recommendations  2. Alcohol use:  Sister reports use of 4+ tall beers per day at least.  No known history of withdrawal, but patient appears agitated and diaphoretic. -CIWA protocol with on demand lorazepam  3. HTN:  -Permissive hypertension for now, but labetalol for SBP >195 mmHg if not resolving with lorazepam       DVT PPx: SCDs Diet: Heart healthy after swallow screen Consultants: Neurology Code Status: Full Family Communication: The diagnosis and expected plan of care were dsicussed with the family at the bedside.  All questions were answered.    Medical decision making: Patient seen 10:00 PM on 07/02/2015.  What exists of the patient's electronic chart were reviewed and the case was discussed with Dr. Nicole Kindred and Dr. Thurnell Garbe.   Disposition Plan:  Will admit for stroke.  Stroke work up as above and consult to ancillary services.  Expect discharge within 2-3 days to SNF.      [place]  [facility] Pager [phone]

## 2015-07-02 NOTE — ED Provider Notes (Signed)
CSN: [id number]     Arrival date & time 07/02/15  1856 History   First MD Initiated Contact with Patient 07/02/15 1903     Chief Complaint  Patient presents with  . Stroke Symptoms      The history is provided by the patient, the spouse and a relative. The history is limited by the condition of the patient (aphasia).  Pt was seen at [place]. Per EMS and pt's family: Pt woke up this morning at 0400 with right sided weakness and facial droop. LKW last night at 2000, per family. Denies CP/SOB, no abd pain, no reported fall.    Past Medical History  Diagnosis Date  . Hypertension   . Alcohol abuse   . Tobacco use   . Stroke ([place]) 2014  . Cocaine abuse 2014     Past Surgical History  Procedure Laterality Date  . Skin graft  Left Leg    1980s   Family History  Problem Relation Age of Onset  . Hypertension Mother   . Hypertension Father    Social History  Substance Use Topics  . Smoking status: Current Every Day Smoker -- 0.25 packs/day for 20 years    Types: Cigarettes  . Smokeless tobacco: None  . Alcohol Use: 7.2 oz/week    12 Cans of beer per week     Comment: drinks 2-40oz beers per day    Review of Systems  Unable to perform ROS: Patient nonverbal     Allergies  Review of patient's allergies indicates no known allergies.  Home Medications   Prior to Admission medications   Medication Sig Start Date End Date Taking? Authorizing Provider  aspirin EC 325 MG EC tablet Take 1 tablet (325 mg total) by mouth daily. Patient not taking: Reported on 07/02/2015 01/17/13   Sheila Oats, MD  hydrochlorothiazide (HYDRODIURIL) 25 MG tablet Take 1 tablet (25 mg total) by mouth daily. Patient not taking: Reported on 07/02/2015 01/17/13   Sheila Oats, MD  lisinopril (PRINIVIL,ZESTRIL) 20 MG tablet Take 1 tablet (20 mg total) by mouth daily. Patient not taking: Reported on 07/02/2015 01/17/13   Sheila Oats, MD  senna-docusate (SENOKOT-S) 8.6-50 MG per tablet Take 1 tablet by  mouth at bedtime as needed. Patient not taking: Reported on 07/02/2015 01/17/13   Sheila Oats, MD   BP 164/102 mmHg  Pulse 77  Resp 16  SpO2 96% Physical Exam  1940: Physical examination:  Nursing notes reviewed; Vital signs and O2 SAT reviewed;  Constitutional: Well developed, Well nourished, In no acute distress; Head:  Normocephalic, atraumatic; Eyes: EOMI, PERRL, No scleral icterus; ENMT: Mouth and pharynx normal, Mucous membranes dry; Neck: Supple, Full range of motion, No lymphadenopathy; Cardiovascular: Regular rate and rhythm, No gallop; Respiratory: Breath sounds clear & equal bilaterally, No wheezes.  Speaking full sentences with ease, Normal respiratory effort/excursion; Chest: Nontender, Movement normal; Abdomen: Soft, Nontender, Nondistended, Normal bowel sounds; Genitourinary: No CVA tenderness; Extremities: Pulses normal, No tenderness, No edema, No calf edema or asymmetry.; Neuro: Awake, alert, eyes open spontaneously. Will nod head yes/no to questions. Right facial droop. Strength 0/5 RUE and RLE, RUE is contracted against his body. Strength 3/5 LUE and LLE without good pt effort, though he will move LUE and LLE spontaneously on stretcher.; Skin: Color normal, Warm, Dry.   ED Course  Procedures (including critical care time) Labs Review  Imaging Review  I have personally reviewed and evaluated these images and lab results as part of my medical  decision-making.   EKG Interpretation   Date/Time:  Wednesday July 02 2015 19:06:09 EST Ventricular Rate:  87 PR Interval:  155 QRS Duration: 98 QT Interval:  414 QTC Calculation: 498 R Axis:   36 Text Interpretation:  Sinus rhythm Consider right atrial enlargement  Abnormal R-wave progression, early transition Probable left ventricular  hypertrophy Nonspecific ST and T wave abnormality Borderline prolonged QT  interval When compared with ECG of 01/15/2013 No significant change was  found Confirmed by [facility]  MD, Nunzio Cory  [phone]) on 07/02/2015 7:55:50 PM      MDM  MDM Reviewed: previous chart, nursing note and vitals Reviewed previous: labs and ECG Interpretation: labs, ECG, MRI and CT scan     Results for orders placed or performed during the hospital encounter of 07/02/15  Protime-INR  Result Value Ref Range   Prothrombin Time 15.1 11.6 - 15.2 seconds   INR 1.17 0.00 - 1.49  APTT  Result Value Ref Range   aPTT 27 24 - 37 seconds  CBC  Result Value Ref Range   WBC 13.4 (H) 4.0 - 10.5 K/uL   RBC 4.63 4.22 - 5.81 MIL/uL   Hemoglobin 15.0 13.0 - 17.0 g/dL   HCT 44.5 39.0 - 52.0 %   MCV 96.1 78.0 - 100.0 fL   MCH 32.4 26.0 - 34.0 pg   MCHC 33.7 30.0 - 36.0 g/dL   RDW 12.9 11.5 - 15.5 %   Platelets 273 150 - 400 K/uL  Differential  Result Value Ref Range   Neutrophils Relative % 89 %   Neutro Abs 11.8 (H) 1.7 - 7.7 K/uL   Lymphocytes Relative 7 %   Lymphs Abs 1.0 0.7 - 4.0 K/uL   Monocytes Relative 4 %   Monocytes Absolute 0.6 0.1 - 1.0 K/uL   Eosinophils Relative 0 %   Eosinophils Absolute 0.0 0.0 - 0.7 K/uL   Basophils Relative 0 %   Basophils Absolute 0.0 0.0 - 0.1 K/uL  Comprehensive metabolic panel  Result Value Ref Range   Sodium 137 135 - 145 mmol/L   Potassium 4.0 3.5 - 5.1 mmol/L   Chloride 105 101 - 111 mmol/L   CO2 21 (L) 22 - 32 mmol/L   Glucose, Bld 110 (H) 65 - 99 mg/dL   BUN 9 6 - 20 mg/dL   Creatinine, Ser 0.90 0.61 - 1.24 mg/dL   Calcium 9.5 8.9 - 10.3 mg/dL   Total Protein 7.1 6.5 - 8.1 g/dL   Albumin 3.9 3.5 - 5.0 g/dL   AST 32 15 - 41 U/L   ALT 23 17 - 63 U/L   Alkaline Phosphatase 57 38 - 126 U/L   Total Bilirubin 1.2 0.3 - 1.2 mg/dL   GFR calc non Af Amer >60 >60 mL/min   GFR calc Af Amer >60 >60 mL/min   Anion gap 11 5 - 15  I-stat troponin, ED (not at [facility], [facility])  Result Value Ref Range   Troponin i, poc 0.01 0.00 - 0.08 ng/mL   Comment 3          I-Stat Chem 8, ED  (not at [facility], [facility])  Result Value Ref Range   Sodium 139 135 - 145 mmol/L    Potassium 3.9 3.5 - 5.1 mmol/L   Chloride 103 101 - 111 mmol/L   BUN 11 6 - 20 mg/dL   Creatinine, Ser 0.70 0.61 - 1.24 mg/dL   Glucose, Bld 107 (H) 65 - 99 mg/dL   Calcium, Ion 1.16  1.12 - 1.23 mmol/L   TCO2 21 0 - 100 mmol/L   Hemoglobin 17.0 13.0 - 17.0 g/dL   HCT 50.0 39.0 - 52.0 %   Ct Head Wo Contrast 07/02/2015  CLINICAL DATA:  Right-sided paralysis and facial droop. Initial symptoms at 4 a.m. this morning. EXAM: CT HEAD WITHOUT CONTRAST TECHNIQUE: Contiguous axial images were obtained from the base of the skull through the vertex without intravenous contrast. COMPARISON:  Brain MRI from earlier same day. Comparison also to head CT dated 01/15/2013. FINDINGS: There is ill-defined areas of edema within the medial and anterior aspects of the left frontal lobe, new compared to the earlier head CT, compatible with the acute infarction seen on the recent brain MRI. Edema extends inferiorly to the level of left periventricular white matter and the genu of the corpus callosum. There is associated mild sulcal effacement. No midline shift or herniation. No parenchymal hemorrhage. No extra-axial hemorrhage. Additional mild chronic small vessel ischemic change noted within the right periventricular white matter. Osseous structures are unremarkable. Visualized upper paranasal sinuses are clear. Mastoid air cells are clear. Superficial soft tissues are unremarkable. IMPRESSION: 1. Ill-defined edema within the medial and anterior aspects of the left frontal lobe, extending inferiorly to the level of the left periventricular white matter and genu of the corpus callosum, compatible with the acute ACA distribution infarction identified on brain MRI performed just before this head CT. 2. Associated mild sulcal effacement. No midline shift or herniation. 3. No intracranial hemorrhage. Electronically Signed   By: Franki Cabot M.D.   On: 07/02/2015 21:05   Mr Brain Wo Contrast (neuro Protocol) 07/02/2015  CLINICAL  DATA:  Right-sided paralysis with facial droop. Last seen normal yesterday at 8 p.m. EXAM: MRI HEAD WITHOUT CONTRAST MRA HEAD WITHOUT CONTRAST TECHNIQUE: Multiplanar, multiecho pulse sequences of the brain and surrounding structures were obtained without intravenous contrast. Angiographic images of the head were obtained using MRA technique without contrast. COMPARISON:  MRI brain 01/17/2013. FINDINGS: MRI HEAD FINDINGS There is a moderately large area of acute infarction, nonhemorrhagic, affecting the LEFT hemisphere within the LEFT anterior cerebral artery distribution. Both focal and confluent areas of acute infarction affect the medial and superior frontal lobe, parasagittal frontal lobe, and cingulate gyrus, regional white matter, as well as the genu of the corpus callosum on the LEFT. No MCA territory involvement. No right-sided involvement. No hemorrhage, mass lesion, hydrocephalus, or extra-axial fluid. Premature for age cerebral and cerebellar atrophy. Extensive focal and confluent white matter signal abnormality, predominantly supratentorial but also affecting the brainstem, consistent with chronic microvascular ischemic change. Numerous chronic lacunar infarcts affect the deep nuclei and periventricular white matter, with greatest involvement in the LEFT basal ganglia. Flow voids are maintained in the carotid, basilar, and both vertebral arteries. Tiny foci of chronic hemorrhage can be seen in the cerebellum, brainstem, and RIGHT thalamus, likely sequelae of hypertensive cerebrovascular disease. Pituitary, pineal, and cerebellar tonsils unremarkable. No upper cervical lesions. Visualized calvarium, skull base, and upper cervical osseous structures unremarkable. Scalp and extracranial soft tissues, orbits, sinuses, and mastoids show no acute process. MRA HEAD FINDINGS The internal carotid arteries are widely patent. The basilar artery is widely patent with vertebrals codominant. No RIGHT or LEFT MCA  disease. The RIGHT A1 ACA is mildly irregular without focal narrowing. The LEFT A1 ACA is moderately diseased in its distal A1 segment, estimated 50-75% stenosis. The LEFT A2 ACA is diffusely narrowed, and there is an acute occlusion of the LEFT anterior cerebral artery in  its mid A2 segment just proximal to the origin of the frontopolar branch. Fetal origin LEFT PCA. Mildly diseased RIGHT PCA and severely narrowed LEFT PCA suggesting intracranial atherosclerotic disease. Moderately irregular BILATERAL MCA branches also consistent with intracranial atherosclerotic disease. No cerebellar branch occlusion.  No intracranial aneurysm. IMPRESSION: Acute nonhemorrhagic LEFT anterior cerebral artery territory infarction affecting the medial and anterior frontal lobe including the cingulate gyrus and regional white matter. 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 No large vessel occlusion is evident. Electronically Signed   By: Staci Righter M.D.   On: 07/02/2015 20:55   Mr Virgel Paling (cerebral Arteries) 07/02/2015  CLINICAL DATA:  Right-sided paralysis with facial droop. Last seen normal yesterday at 8 p.m. EXAM: MRI HEAD WITHOUT CONTRAST MRA HEAD WITHOUT CONTRAST TECHNIQUE: Multiplanar, multiecho pulse sequences of the brain and surrounding structures were obtained without intravenous contrast. Angiographic images of the head were obtained using MRA technique without contrast. COMPARISON:  MRI brain 01/17/2013. FINDINGS: MRI HEAD FINDINGS There is a moderately large area of acute infarction, nonhemorrhagic, affecting the LEFT hemisphere within the LEFT anterior cerebral artery distribution. Both focal and confluent areas of acute infarction affect the medial and superior frontal lobe,  parasagittal frontal lobe, and cingulate gyrus, regional white matter, as well as the genu of the corpus callosum on the LEFT. No MCA territory involvement. No right-sided involvement. No hemorrhage, mass lesion, hydrocephalus, or extra-axial fluid. Premature for age cerebral and cerebellar atrophy. Extensive focal and confluent white matter signal abnormality, predominantly supratentorial but also affecting the brainstem, consistent with chronic microvascular ischemic change. Numerous chronic lacunar infarcts affect the deep nuclei and periventricular white matter, with greatest involvement in the LEFT basal ganglia. Flow voids are maintained in the carotid, basilar, and both vertebral arteries. Tiny foci of chronic hemorrhage can be seen in the cerebellum, brainstem, and RIGHT thalamus, likely sequelae of hypertensive cerebrovascular disease. Pituitary, pineal, and cerebellar tonsils unremarkable. No upper cervical lesions. Visualized calvarium, skull base, and upper cervical osseous structures unremarkable. Scalp and extracranial soft tissues, orbits, sinuses, and mastoids show no acute process. MRA HEAD FINDINGS The internal carotid arteries are widely patent. The basilar artery is widely patent with vertebrals codominant. No RIGHT or LEFT MCA disease. The RIGHT A1 ACA is mildly irregular without focal narrowing. The LEFT A1 ACA is moderately diseased in its distal A1 segment, estimated 50-75% stenosis. The LEFT A2 ACA is diffusely narrowed, and there is an acute occlusion of the LEFT anterior cerebral artery in its mid A2 segment just proximal to the origin of the frontopolar branch. Fetal origin LEFT PCA. Mildly diseased RIGHT PCA and severely narrowed LEFT PCA suggesting intracranial atherosclerotic disease. Moderately irregular BILATERAL MCA branches also consistent with intracranial atherosclerotic disease. No cerebellar branch occlusion.  No intracranial aneurysm. IMPRESSION: Acute nonhemorrhagic LEFT  anterior cerebral artery territory infarction affecting the medial and anterior frontal lobe including the cingulate gyrus and regional white matter. 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 No large vessel occlusion is evident. Electronically Signed   By: Staci Righter M.D.   On: 07/02/2015 20:55    2115:  Pt is not code stroke: pt woke up with symptoms today at 0400, LKW last night.  Acute stroke  on MRI. Dx and testing d/w pt and family.  Questions answered.  Verb understanding, agreeable to admit. T/C to Neuro Dr. Nicole Kindred, case discussed, including:  HPI, pertinent PM/SHx, VS/PE, dx testing, ED course and treatment:  Agreeable to consult, requests to admit to medicine service. T/C to [facility] Dr. Loleta Books, case discussed, including:  HPI, pertinent PM/SHx, VS/PE, dx testing, ED course and treatment:  Agreeable to admit, requests to write temporary orders, obtain tele bed to team [facility].   Francine Graven, DO 07/05/15 2325

## 2015-07-02 NOTE — ED Notes (Signed)
Right arm and leg noted to be rigid

## 2015-07-02 NOTE — ED Notes (Signed)
Patient transported to MRI 

## 2015-07-02 NOTE — ED Notes (Signed)
CBG was 120

## 2015-07-02 NOTE — ED Notes (Signed)
Pt placed into gown and on to monitor upon arrival to room. Pt monitored by blood pressure, pulse ox, and 12 lead. Pts  EKG given to and signed by Dr. Thurnell Garbe

## 2015-07-02 NOTE — Consult Note (Signed)
Admission H&P    Chief Complaint: new-onset slurred speech and right hemiplegia.  HPI: Gerald [name] is an 55 y.o. male with a history of hypertension and alcohol use, presenting with new onset hemiplegia and slurred speech. Patient was last known well at 8 PM on 07/01/2015. He has not been compliant with taking antihypertensive medication. Blood pressure was 205/ 123 in the emergency room. He was given IV labetalol for emergency management. CT scan of his head, as well as MRI showed acute left ACA territory ischemic infarction. MRA showed 50-75% stenosis of left A1 segment, as well as diseased/occluded left A2 ACA. NIH stroke score was 15.  LSN: 8 PM on 07/01/2015 tPA Given: No: beyond time under for treatment consideration mRankin:  Past Medical History  Diagnosis Date  . Hypertension   . Alcohol abuse   . Tobacco use   . Stroke ([facility]) 2014  . Cocaine abuse 2014    Past Surgical History  Procedure Laterality Date  . Skin graft  Left Leg    1980s    Family History  Problem Relation Age of Onset  . Hypertension Mother   . Hypertension Father    Social History:  reports that he has been smoking Cigarettes.  He has a 5 pack-year smoking history. He does not have any smokeless tobacco history on file. He reports that he drinks about 7.2 oz of alcohol per week. He reports that he does not use illicit drugs.  Allergies: No Known Allergies  Medications: Patient's preadmission medications were reviewed by me.  ROS: History obtained from patient's sisters.  General ROS: negative for - chills, fatigue, fever, night sweats, weight gain or weight loss Psychological ROS: negative for - behavioral disorder, hallucinations, memory difficulties, mood swings or suicidal ideation Ophthalmic ROS: negative for - blurry vision, double vision, eye pain or loss of vision ENT ROS: negative for - epistaxis, nasal discharge, oral lesions, sore throat, tinnitus or vertigo Allergy and Immunology  ROS: negative for - hives or itchy/watery eyes Hematological and Lymphatic ROS: negative for - bleeding problems, bruising or swollen lymph nodes Endocrine ROS: negative for - galactorrhea, hair pattern changes, polydipsia/polyuria or temperature intolerance Respiratory ROS: negative for - cough, hemoptysis, shortness of breath or wheezing Cardiovascular ROS: negative for - chest pain, dyspnea on exertion, edema or irregular heartbeat Gastrointestinal ROS: negative for - abdominal pain, diarrhea, hematemesis, nausea/vomiting or stool incontinence Genito-Urinary ROS: negative for - dysuria, hematuria, incontinence or urinary frequency/urgency Musculoskeletal ROS: negative for - joint swelling or muscular weakness Neurological ROS: as noted in HPI Dermatological ROS: negative for rash and skin lesion changes  Physical Examination: Blood pressure 205/123, pulse 92, resp. rate 23, SpO2 91 %.  HEENT-  Normocephalic, no lesions, without obvious abnormality.  Normal external eye and conjunctiva.  Normal TM's bilaterally.  Normal auditory canals and external ears. Normal external nose, mucus membranes and septum.  Normal pharynx. Neck supple with no masses, nodes, nodules or enlargement. Cardiovascular - tachycardia, sinus rhythm, normal S1 and S2, no murmur or gallop Lungs - chest clear, no wheezing, rales, normal symmetric air entry Abdomen - soft, non-tender; bowel sounds normal; no masses,  no organomegaly Extremities - no joint deformities, effusion, or inflammation  Neurologic Examination: Mental Status: Alert, disoriented to correct age and year,moderately agitated.  Speech markedly slurred without evidence of aphasia. Able to follow commands without difficulty. Cranial Nerves: II-Visual fields were normal. III/IV/VI-Pupils were equal and reacted normally to light. Extraocular movements were full and conjugate.  V/VII-no facial numbness; moderately severe right lower facial  weakness. VIII-normal. X-moderately severe dysarthria. XI: trapezius strength/neck flexion strength normal bilaterally XII-midline tongue extension with normal strength. Motor: spastic right hemiplegia; normal strength and tone of left extremities Sensory: Normal throughout. Deep Tendon Reflexes: 2+ and symmetric. Plantars: mute bilaterally Cerebellar: Normal finger-to-nose testing with use of left upper extremity. Carotid auscultation: Normal  Results for orders placed or performed during the hospital encounter of 07/02/15 (from the past 48 hour(s))  Protime-INR     Status: None   Collection Time: 07/02/15  7:22 PM  Result Value Ref Range   Prothrombin Time 15.1 11.6 - 15.2 seconds   INR 1.17 0.00 - 1.49  APTT     Status: None   Collection Time: 07/02/15  7:22 PM  Result Value Ref Range   aPTT 27 24 - 37 seconds  CBC     Status: Abnormal   Collection Time: 07/02/15  7:22 PM  Result Value Ref Range   WBC 13.4 (H) 4.0 - 10.5 K/uL   RBC 4.63 4.22 - 5.81 MIL/uL   Hemoglobin 15.0 13.0 - 17.0 g/dL   HCT 88.5 84.1 - 86.7 %   MCV 96.1 78.0 - 100.0 fL   MCH 32.4 26.0 - 34.0 pg   MCHC 33.7 30.0 - 36.0 g/dL   RDW 29.2 67.2 - 82.0 %   Platelets 273 150 - 400 K/uL  Differential     Status: Abnormal   Collection Time: 07/02/15  7:22 PM  Result Value Ref Range   Neutrophils Relative % 89 %   Neutro Abs 11.8 (H) 1.7 - 7.7 K/uL   Lymphocytes Relative 7 %   Lymphs Abs 1.0 0.7 - 4.0 K/uL   Monocytes Relative 4 %   Monocytes Absolute 0.6 0.1 - 1.0 K/uL   Eosinophils Relative 0 %   Eosinophils Absolute 0.0 0.0 - 0.7 K/uL   Basophils Relative 0 %   Basophils Absolute 0.0 0.0 - 0.1 K/uL  Comprehensive metabolic panel     Status: Abnormal   Collection Time: 07/02/15  7:22 PM  Result Value Ref Range   Sodium 137 135 - 145 mmol/L   Potassium 4.0 3.5 - 5.1 mmol/L   Chloride 105 101 - 111 mmol/L   CO2 21 (L) 22 - 32 mmol/L   Glucose, Bld 110 (H) 65 - 99 mg/dL   BUN 9 6 - 20 mg/dL    Creatinine, Ser 5.41 0.61 - 1.24 mg/dL   Calcium 9.5 8.9 - 09.2 mg/dL   Total Protein 7.1 6.5 - 8.1 g/dL   Albumin 3.9 3.5 - 5.0 g/dL   AST 32 15 - 41 U/L   ALT 23 17 - 63 U/L   Alkaline Phosphatase 57 38 - 126 U/L   Total Bilirubin 1.2 0.3 - 1.2 mg/dL   GFR calc non Af Amer >60 >60 mL/min   GFR calc Af Amer >60 >60 mL/min    Comment: (NOTE) The eGFR has been calculated using the CKD EPI equation. This calculation has not been validated in all clinical situations. eGFR's persistently <60 mL/min signify possible Chronic Kidney Disease.    Anion gap 11 5 - 15  I-stat troponin, ED (not at [facility], [facility])     Status: None   Collection Time: 07/02/15  7:24 PM  Result Value Ref Range   Troponin i, poc 0.01 0.00 - 0.08 ng/mL   Comment 3            Comment: Due to the  release kinetics of cTnI, a negative result within the first hours of the onset of symptoms does not rule out myocardial infarction with certainty. If myocardial infarction is still suspected, repeat the test at appropriate intervals.   I-Stat Chem 8, ED  (not at [facility], [facility])     Status: Abnormal   Collection Time: 07/02/15  7:25 PM  Result Value Ref Range   Sodium 139 135 - 145 mmol/L   Potassium 3.9 3.5 - 5.1 mmol/L   Chloride 103 101 - 111 mmol/L   BUN 11 6 - 20 mg/dL   Creatinine, Ser 0.70 0.61 - 1.24 mg/dL   Glucose, Bld 107 (H) 65 - 99 mg/dL   Calcium, Ion 1.16 1.12 - 1.23 mmol/L   TCO2 21 0 - 100 mmol/L   Hemoglobin 17.0 13.0 - 17.0 g/dL   HCT 50.0 39.0 - 52.0 %   Ct Head Wo Contrast  07/02/2015  CLINICAL DATA:  Right-sided paralysis and facial droop. Initial symptoms at 4 a.m. this morning. EXAM: CT HEAD WITHOUT CONTRAST TECHNIQUE: Contiguous axial images were obtained from the base of the skull through the vertex without intravenous contrast. COMPARISON:  Brain MRI from earlier same day. Comparison also to head CT dated 01/15/2013. FINDINGS: There is ill-defined areas of edema within the medial and anterior aspects  of the left frontal lobe, new compared to the earlier head CT, compatible with the acute infarction seen on the recent brain MRI. Edema extends inferiorly to the level of left periventricular white matter and the genu of the corpus callosum. There is associated mild sulcal effacement. No midline shift or herniation. No parenchymal hemorrhage. No extra-axial hemorrhage. Additional mild chronic small vessel ischemic change noted within the right periventricular white matter. Osseous structures are unremarkable. Visualized upper paranasal sinuses are clear. Mastoid air cells are clear. Superficial soft tissues are unremarkable. IMPRESSION: 1. Ill-defined edema within the medial and anterior aspects of the left frontal lobe, extending inferiorly to the level of the left periventricular white matter and genu of the corpus callosum, compatible with the acute ACA distribution infarction identified on brain MRI performed just before this head CT. 2. Associated mild sulcal effacement. No midline shift or herniation. 3. No intracranial hemorrhage. Electronically Signed   By: Franki Cabot M.D.   On: 07/02/2015 21:05   Mr Brain Wo Contrast (neuro Protocol)  07/02/2015  CLINICAL DATA:  Right-sided paralysis with facial droop. Last seen normal yesterday at 8 p.m. EXAM: MRI HEAD WITHOUT CONTRAST MRA HEAD WITHOUT CONTRAST TECHNIQUE: Multiplanar, multiecho pulse sequences of the brain and surrounding structures were obtained without intravenous contrast. Angiographic images of the head were obtained using MRA technique without contrast. COMPARISON:  MRI brain 01/17/2013. FINDINGS: MRI HEAD FINDINGS There is a moderately large area of acute infarction, nonhemorrhagic, affecting the LEFT hemisphere within the LEFT anterior cerebral artery distribution. Both focal and confluent areas of acute infarction affect the medial and superior frontal lobe, parasagittal frontal lobe, and cingulate gyrus, regional white matter, as well as the  genu of the corpus callosum on the LEFT. No MCA territory involvement. No right-sided involvement. No hemorrhage, mass lesion, hydrocephalus, or extra-axial fluid. Premature for age cerebral and cerebellar atrophy. Extensive focal and confluent white matter signal abnormality, predominantly supratentorial but also affecting the brainstem, consistent with chronic microvascular ischemic change. Numerous chronic lacunar infarcts affect the deep nuclei and periventricular white matter, with greatest involvement in the LEFT basal ganglia. Flow voids are maintained in the carotid, basilar, and both vertebral arteries.  Tiny foci of chronic hemorrhage can be seen in the cerebellum, brainstem, and RIGHT thalamus, likely sequelae of hypertensive cerebrovascular disease. Pituitary, pineal, and cerebellar tonsils unremarkable. No upper cervical lesions. Visualized calvarium, skull base, and upper cervical osseous structures unremarkable. Scalp and extracranial soft tissues, orbits, sinuses, and mastoids show no acute process. MRA HEAD FINDINGS The internal carotid arteries are widely patent. The basilar artery is widely patent with vertebrals codominant. No RIGHT or LEFT MCA disease. The RIGHT A1 ACA is mildly irregular without focal narrowing. The LEFT A1 ACA is moderately diseased in its distal A1 segment, estimated 50-75% stenosis. The LEFT A2 ACA is diffusely narrowed, and there is an acute occlusion of the LEFT anterior cerebral artery in its mid A2 segment just proximal to the origin of the frontopolar branch. Fetal origin LEFT PCA. Mildly diseased RIGHT PCA and severely narrowed LEFT PCA suggesting intracranial atherosclerotic disease. Moderately irregular BILATERAL MCA branches also consistent with intracranial atherosclerotic disease. No cerebellar branch occlusion.  No intracranial aneurysm. IMPRESSION: Acute nonhemorrhagic LEFT anterior cerebral artery territory infarction affecting the medial and anterior frontal  lobe including the cingulate gyrus and regional white matter. 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 No large vessel occlusion is evident. Electronically Signed   By: Staci Righter M.D.   On: 07/02/2015 20:55   Mr Virgel Paling (cerebral Arteries)  07/02/2015  CLINICAL DATA:  Right-sided paralysis with facial droop. Last seen normal yesterday at 8 p.m. EXAM: MRI HEAD WITHOUT CONTRAST MRA HEAD WITHOUT CONTRAST TECHNIQUE: Multiplanar, multiecho pulse sequences of the brain and surrounding structures were obtained without intravenous contrast. Angiographic images of the head were obtained using MRA technique without contrast. COMPARISON:  MRI brain 01/17/2013. FINDINGS: MRI HEAD FINDINGS There is a moderately large area of acute infarction, nonhemorrhagic, affecting the LEFT hemisphere within the LEFT anterior cerebral artery distribution. Both focal and confluent areas of acute infarction affect the medial and superior frontal lobe, parasagittal frontal lobe, and cingulate gyrus, regional white matter, as well as the genu of the corpus callosum on the LEFT. No MCA territory involvement. No right-sided involvement. No hemorrhage, mass lesion, hydrocephalus, or extra-axial fluid. Premature for age cerebral and cerebellar atrophy. Extensive focal and confluent white matter signal abnormality, predominantly supratentorial but also affecting the brainstem, consistent with chronic microvascular ischemic change. Numerous chronic lacunar infarcts affect the deep nuclei and periventricular white matter, with greatest involvement in the LEFT basal ganglia. Flow voids are maintained in the carotid, basilar, and both vertebral arteries. Tiny foci of chronic hemorrhage can be seen in the cerebellum,  brainstem, and RIGHT thalamus, likely sequelae of hypertensive cerebrovascular disease. Pituitary, pineal, and cerebellar tonsils unremarkable. No upper cervical lesions. Visualized calvarium, skull base, and upper cervical osseous structures unremarkable. Scalp and extracranial soft tissues, orbits, sinuses, and mastoids show no acute process. MRA HEAD FINDINGS The internal carotid arteries are widely patent. The basilar artery is widely patent with vertebrals codominant. No RIGHT or LEFT MCA disease. The RIGHT A1 ACA is mildly irregular without focal narrowing. The LEFT A1 ACA is moderately diseased in its distal A1 segment, estimated 50-75% stenosis. The LEFT A2 ACA is diffusely narrowed, and there is an acute occlusion of the LEFT anterior cerebral artery in its mid A2 segment just proximal to the origin of the frontopolar branch. Fetal origin LEFT PCA. Mildly diseased RIGHT PCA and severely  narrowed LEFT PCA suggesting intracranial atherosclerotic disease. Moderately irregular BILATERAL MCA branches also consistent with intracranial atherosclerotic disease. No cerebellar branch occlusion.  No intracranial aneurysm. IMPRESSION: Acute nonhemorrhagic LEFT anterior cerebral artery territory infarction affecting the medial and anterior frontal lobe including the cingulate gyrus and regional white matter. 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 No large vessel occlusion is evident. Electronically Signed   By: Staci Righter M.D.   On: 07/02/2015 20:55    Assessment: 55 y.o. male with a history of hypertension and noncompliance with treatment, presenting with acute left ACA territory ischemic infarction.  Stroke Risk Factors - family history and hypertension  Plan: 1. HgbA1c, fasting  lipid panel 2. PT consult, OT consult, Speech consult 3. Echocardiogram 4. Carotid dopplers 5. Prophylactic therapy-Antiplatelet med: Aspirin  6. Risk factor modification 7. Telemetry monitoring  C.R. Nicole Kindred, MD [facility] [phone]  07/02/2015, 9:49 PM

## 2015-07-03 ENCOUNTER — Ambulatory Visit (HOSPITAL_COMMUNITY): Payer: Medicaid Other

## 2015-07-03 ENCOUNTER — Encounter (HOSPITAL_COMMUNITY): Payer: Self-pay | Admitting: General Practice

## 2015-07-03 DIAGNOSIS — I6789 Other cerebrovascular disease: Secondary | ICD-10-CM

## 2015-07-03 DIAGNOSIS — I639 Cerebral infarction, unspecified: Secondary | ICD-10-CM | POA: Insufficient documentation

## 2015-07-03 LAB — COMPREHENSIVE METABOLIC PANEL
ALT: 22 U/L (ref 17–63)
AST: 28 U/L (ref 15–41)
Albumin: 3.5 g/dL (ref 3.5–5.0)
Alkaline Phosphatase: 53 U/L (ref 38–126)
Anion gap: 10 (ref 5–15)
BUN: 8 mg/dL (ref 6–20)
CO2: 22 mmol/L (ref 22–32)
Calcium: 9.1 mg/dL (ref 8.9–10.3)
Chloride: 105 mmol/L (ref 101–111)
Creatinine, Ser: 0.92 mg/dL (ref 0.61–1.24)
GFR calc Af Amer: >60 mL/min (ref >60)
GFR calc non Af Amer: >60 mL/min (ref >60)
Glucose, Bld: 115 mg/dL — ABNORMAL HIGH (ref 65–99)
Potassium: 3.9 mmol/L (ref 3.5–5.1)
Sodium: 137 mmol/L (ref 135–145)
Total Bilirubin: 1.5 mg/dL — ABNORMAL HIGH (ref 0.3–1.2)
Total Protein: 6.6 g/dL (ref 6.5–8.1)

## 2015-07-03 LAB — CBC
HCT: 42.2 % (ref 39.0–52.0)
Hemoglobin: 14 g/dL (ref 13.0–17.0)
MCH: 31.3 pg (ref 26.0–34.0)
MCHC: 33.2 g/dL (ref 30.0–36.0)
MCV: 94.4 fL (ref 78.0–100.0)
Platelets: 276 10*3/uL (ref 150–400)
RBC: 4.47 MIL/uL (ref 4.22–5.81)
RDW: 12.9 % (ref 11.5–15.5)
WBC: 10.7 10*3/uL — ABNORMAL HIGH (ref 4.0–10.5)

## 2015-07-03 LAB — LIPID PANEL
Cholesterol: 172 mg/dL (ref 0–200)
HDL: 72 mg/dL (ref >40)
LDL Cholesterol: 91 mg/dL (ref 0–99)
Total CHOL/HDL Ratio: 2.4 RATIO
Triglycerides: 44 mg/dL (ref <150)
VLDL: 9 mg/dL (ref 0–40)

## 2015-07-03 LAB — CBG MONITORING, ED: Glucose-Capillary: 120 mg/dL — ABNORMAL HIGH (ref 65–99)

## 2015-07-03 LAB — GLUCOSE, CAPILLARY
Glucose-Capillary: 83 mg/dL (ref 65–99)
Glucose-Capillary: 107 mg/dL — ABNORMAL HIGH (ref 65–99)

## 2015-07-03 MED ORDER — SENNOSIDES-DOCUSATE SODIUM 8.6-50 MG PO TABS
1.0000 | ORAL_TABLET | Freq: Every evening | ORAL | Status: DC | PRN
  Filled 2015-07-03: qty 1

## 2015-07-03 MED ORDER — ADULT MULTIVITAMIN W/MINERALS CH
1.0000 | ORAL_TABLET | Freq: Every day | ORAL | Status: DC
  Administered 2015-07-05 – 2015-07-08 (×4): 1 via ORAL
  Filled 2015-07-03 (×4): qty 1

## 2015-07-03 MED ORDER — ASPIRIN 300 MG RE SUPP
300.0000 mg | Freq: Every day | RECTAL | Status: DC
  Administered 2015-07-03 – 2015-07-04 (×2): 300 mg via RECTAL
  Filled 2015-07-03 (×2): qty 1

## 2015-07-03 MED ORDER — ASPIRIN 325 MG PO TABS
325.0000 mg | ORAL_TABLET | Freq: Every day | ORAL | Status: DC
  Administered 2015-07-05 – 2015-07-08 (×4): 325 mg via ORAL
  Filled 2015-07-03 (×4): qty 1

## 2015-07-03 MED ORDER — ENOXAPARIN SODIUM 40 MG/0.4ML ~~LOC~~ SOLN
40.0000 mg | SUBCUTANEOUS | Status: DC
  Administered 2015-07-03 – 2015-07-07 (×5): 40 mg via SUBCUTANEOUS
  Filled 2015-07-03 (×5): qty 0.4

## 2015-07-03 MED ORDER — LABETALOL HCL 5 MG/ML IV SOLN
5.0000 mg | INTRAVENOUS | Status: DC | PRN
Start: 2015-07-03 — End: 2015-07-04
  Filled 2015-07-03: qty 4

## 2015-07-03 MED ORDER — FOLIC ACID 1 MG PO TABS
1.0000 mg | ORAL_TABLET | Freq: Every day | ORAL | Status: DC
  Administered 2015-07-05 – 2015-07-08 (×4): 1 mg via ORAL
  Filled 2015-07-03 (×3): qty 1

## 2015-07-03 MED ORDER — LORAZEPAM 2 MG/ML IJ SOLN
1.0000 mg | Freq: Four times a day (QID) | INTRAMUSCULAR | Status: AC | PRN
  Administered 2015-07-03: 1 mg via INTRAVENOUS
  Filled 2015-07-03: qty 1

## 2015-07-03 MED ORDER — ATORVASTATIN CALCIUM 20 MG PO TABS
20.0000 mg | ORAL_TABLET | Freq: Every day | ORAL | Status: DC
  Administered 2015-07-04 – 2015-07-07 (×4): 20 mg via ORAL
  Filled 2015-07-03 (×4): qty 1

## 2015-07-03 MED ORDER — THIAMINE HCL 100 MG/ML IJ SOLN
100.0000 mg | Freq: Every day | INTRAMUSCULAR | Status: DC
  Administered 2015-07-03 – 2015-07-04 (×2): 100 mg via INTRAVENOUS
  Filled 2015-07-03 (×2): qty 2

## 2015-07-03 MED ORDER — SODIUM CHLORIDE 0.9 % IV SOLN
INTRAVENOUS | Status: DC
  Administered 2015-07-03 – 2015-07-05 (×4): via INTRAVENOUS

## 2015-07-03 MED ORDER — VITAMIN B-1 100 MG PO TABS
100.0000 mg | ORAL_TABLET | Freq: Every day | ORAL | Status: DC
  Administered 2015-07-05 – 2015-07-08 (×4): 100 mg via ORAL
  Filled 2015-07-03 (×4): qty 1

## 2015-07-03 MED ORDER — INFLUENZA VAC SPLIT QUAD 0.5 ML IM SUSY
0.5000 mL | PREFILLED_SYRINGE | INTRAMUSCULAR | Status: AC
  Administered 2015-07-05: 0.5 mL via INTRAMUSCULAR
  Filled 2015-07-03: qty 0.5

## 2015-07-03 MED ORDER — STROKE: EARLY STAGES OF RECOVERY BOOK
Freq: Once | Status: AC
  Administered 2015-07-04: 16:00:00
  Filled 2015-07-03: qty 1

## 2015-07-03 MED ORDER — LORAZEPAM 1 MG PO TABS: 1.0000 mg | ORAL_TABLET | Freq: Four times a day (QID) | ORAL | Status: AC | PRN

## 2015-07-03 NOTE — Progress Notes (Signed)
STROKE TEAM PROGRESS NOTE   HISTORY OF PRESENT ILLNESS Gerald [name] is an 55 y.o. male with a history of hypertension and alcohol use, presenting with new onset hemiplegia and slurred speech. Patient was last known well at 8 PM on 07/01/2015. He has not been compliant with taking antihypertensive medication. Blood pressure was 205/ 123 in the emergency room. He was given IV labetalol for emergency management. CT scan of his head, as well as MRI showed acute left ACA territory ischemic infarction. MRA showed 50-75% stenosis of left A1 segment, as well as diseased/occluded left A2 ACA. NIH stroke score was 15. Patient was not administered TPA secondary to beyond time under for treatment consideration. He was admitted for further evaluation and treatment.   SUBJECTIVE (INTERVAL HISTORY) His RN and NT are at the bedside.  Overall he feels his condition is unchanged. No family present. Pt minimally conversant, can speak name. Poor historian.   OBJECTIVE Pulse Rate:  [65-106] 82 (01/19 0933) Cardiac Rhythm:  [-]  Resp:  [10-30] 10 (01/19 0933) BP: (140-205)/(95-123) 171/108 mmHg (01/19 0933) SpO2:  [91 %-99 %] 97 % (01/19 0933)  CBC:  Recent Labs Lab 07/02/15 1922 07/02/15 1925 07/03/15 0311  WBC 13.4*  --  10.7*  NEUTROABS 11.8*  --   --   HGB 15.0 17.0 14.0  HCT 44.5 50.0 42.2  MCV 96.1  --  94.4  PLT 273  --  [id number]    Basic Metabolic Panel:  Recent Labs Lab 07/02/15 1922 07/02/15 1925 07/03/15 0311  NA 137 139 137  K 4.0 3.9 3.9  CL 105 103 105  CO2 21*  --  22  GLUCOSE 110* 107* 115*  BUN 9 11 8   CREATININE 0.90 0.70 0.92  CALCIUM 9.5  --  9.1    Lipid Panel:    Component Value Date/Time   CHOL 172 07/03/2015 0311   TRIG 44 07/03/2015 0311   HDL 72 07/03/2015 0311   CHOLHDL 2.4 07/03/2015 0311   VLDL 9 07/03/2015 0311   LDLCALC 91 07/03/2015 0311   HgbA1c:  Lab Results  Component Value Date   HGBA1C 5.1 01/16/2013   Urine Drug Screen:    Component Value  Date/Time   LABOPIA NONE DETECTED 07/02/2015 2130   COCAINSCRNUR POSITIVE* 07/02/2015 2130   LABBENZ NONE DETECTED 07/02/2015 2130   AMPHETMU NONE DETECTED 07/02/2015 2130   THCU NONE DETECTED 07/02/2015 2130   LABBARB NONE DETECTED 07/02/2015 2130      IMAGING  Ct Head Wo Contrast 07/02/2015   1. Ill-defined edema within the medial and anterior aspects of the left frontal lobe, extending inferiorly to the level of the left periventricular white matter and genu of the corpus callosum, compatible with the acute ACA distribution infarction identified on brain MRI performed just before this head CT. 2. Associated mild sulcal effacement. No midline shift or herniation. 3. No intracranial hemorrhage.   Mr Brain Wo Contrast (neuro Protocol) 07/02/2015  Acute nonhemorrhagic LEFT anterior cerebral artery territory infarction affecting the medial and anterior frontal lobe including the cingulate gyrus and regional white matter. 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  Mr Virgel Paling (cerebral Arteries) 07/02/2015 Intracranial atherosclerotic disease with diseased/occluded LEFT A2 ACA. No large vessel occlusion is evident.   2D Echocardiogram    Carotid Doppler     PHYSICAL EXAM Pleasant middle-aged African-American male currently not in distress. . Afebrile.  Head is nontraumatic. Neck is supple without bruit.    Cardiac exam no murmur or gallop. Lungs are clear to auscultation. Distal pulses are well felt. Neurological Exam :  Awake alert oriented 1. Diminished attention, registration and recall. Mild dysarthria. Speech is nonfluent and few words only.. Follows commands well. Extraocular movements are full range without nystagmus. Vision acuity and fields seem adequate. Right lower facial weakness. Tongue is midline. Jaw jerk is brisk. Dense  right hemiplegia with increased tone 1/5. Normal strength on the left side. Diminished right hemibody sensation. Gait was not tested. ASSESSMENT/PLAN Gerald [name] is a 55 y.o. male with history of HTN and alcohol use presenting with right hemiplegia and slurred speech. He did not receive IV t-PA due to delay in arrival.   Stroke:  Dominant left ACA/MCA infarct  secondary to unknown source, work up underway  Resultant  R hemiplegia, dysarthria, dysphagia  MRI  L ACA/MCA infarct. Advanced atherosclerotic disease  MRA  L A2 atherosclerosis  Carotid Doppler  pending   2D Echo  pending   Increase IVF to 75/hr  LDL 91  HgbA1c pending  SCDs for VTE prophylaxis. Will add lovenox.  Diet NPO time specified  aspirin 325 mg daily prior to admission, now on aspirin 300 mg suppository daily  Ongoing aggressive stroke risk factor management  Therapy recommendations:  pending   Disposition:  pending   Hypertensive Emergency  BP 205/ 123 on arrival  Improving  Permissive hypertension (OK if < 220/120) but gradually normalize in 5-7 days  Hyperlipidemia  Home meds:  No statin  LDL 91, goal < 70  Add statin once pt able to swallow  Other Stroke Risk Factors  Cigarette smoker, advised to stop smoking  Cocaine use, UDS present on admission , advised to stop useing  ETOH use  Hx stroke/TIA  01/2013 L basal ganglia infarct d/t small vessel disease  Hospital day # [place] for Pager information 07/03/2015 11:37 AM  I have personally examined this patient, reviewed notes, independently viewed imaging studies, participated in medical decision making and plan of care. I have made any additions or clarifications directly to the above note. Agree with note above.  He presented with slurred speech and right hemiplegia due to large left anterior cerebral artery infarct etiology likely intracranial atherosclerosis. He remains at risk  for neurological worsening, recurrent stroke, TIA needs ongoing stroke evaluation. He will likely need inpatient rehabilitation transfer. Have counseled the patient to quit smoking and drinking alcohol.  Antony Contras, MD Medical Director [facility] Pager: [phone] 07/03/2015 3:12 PM    To contact Stroke Continuity provider, please refer to [email or web address] After hours, contact General Neurology

## 2015-07-03 NOTE — ED Notes (Signed)
Patient CBG was 111, Nurse was informed.

## 2015-07-03 NOTE — ED Notes (Signed)
Patient had removed his nasal cannula.  Replaced by this RN and on 2L nasal cannula.  Family at bedside.

## 2015-07-03 NOTE — Progress Notes (Signed)
Received report from Santiago Glad

## 2015-07-03 NOTE — Progress Notes (Signed)
  Echocardiogram 2D Echocardiogram has been performed.  Bobbye Charleston 07/03/2015, 10:59 AM

## 2015-07-03 NOTE — Progress Notes (Signed)
NURSING PROGRESS NOTE  MICHELE RISNER [id number] Admission Data: 07/03/2015 2:55 PM Attending Provider: Mendel Corning, MD PCP:No PCP Per Patient Code Status: Full Code   Gerald [name] is a 55 y.o. male patient admitted from ED:  -No acute distress noted.  -No complaints of shortness of breath.  -No complaints of chest pain.    Blood pressure 191/108, pulse 77, resp. rate 11, SpO2 97 %.   IV Fluids:  IV in place, occlusive dsg intact without redness, IV cath antecubital left, condition patent and no redness.   Allergies:  Review of patient's allergies indicates no known allergies.  Past Medical History:   has a past medical history of Hypertension; Alcohol abuse; Tobacco use; Stroke [facility]) (2014); and Cocaine abuse (2014).  Past Surgical History:   has past surgical history that includes Skin graft (Left Leg).  Social History:   reports that he has been smoking Cigarettes.  He has a 5 pack-year smoking history. He does not have any smokeless tobacco history on file. He reports that he drinks about 7.2 oz of alcohol per week. He reports that he does not use illicit drugs.    Patient/Family orientated to room. Information packet given to patient/family. Admission inpatient armband information verified with patient/family to include name and date of birth and placed on patient arm. Side rails up x 2, fall assessment and education completed with patient/family. Patient/family able to verbalize understanding of risk associated with falls and verbalized understanding to call for assistance before getting out of bed. Call light within reach. Patient/family able to voice and demonstrate understanding of unit orientation instructions.    Will continue to evaluate and treat per MD orders.     Doristine Devoid, RN

## 2015-07-03 NOTE — ED Notes (Signed)
Echo at bed side with patient.

## 2015-07-03 NOTE — Progress Notes (Signed)
[facility]                                                                              Patient Demographics  Gerald [name], is a 55 y.o. male, DOB - [birth year], [id number]  Admit date - 07/02/2015   Admitting Physician No admitting provider for patient encounter.  Outpatient Primary MD for the patient is No PCP Per Patient  LOS - 1   Chief Complaint  Patient presents with  . Stroke Symptoms       Brief HPI   FREE DIOP is a 55 y.o. male with a past medical history significant for HTN, smoking, and CVA in 2014 who presented with right sided weakness. Patient was in his normal state of health until he went to the bed the night before the admission, woke up at 4 AM feeling right-sided weakness. In ED, he was hypertensive greater than 200/120, coags were normal, troponin was normal, renal function was normal, and a CT head showed left-sided changes. A follow-up MRI MRA showed acute nonhemorrhagic CVA in the left ACA distribution affecting medial, superior frontal lobe, parasagittal frontal lobe, and cingulate gyrus. Neurology evaluated the patient.Patient was admitted for further workup   Assessment & Plan    Principal Problem:  Acute CVA- presenting with right-sided hemiparesis, slurred speech - Delayed presentation hence not a TPA candidate. Neurology was consulted. - MRI brain showed acute nonhemorrhagic left anterior cerebral artery territory infarction affecting the medial and anterior frontal lobe including the single regardless, additional white matter. Advanced chronic microvascular ischemic changes, numerous lacunar infarcts, chronic micro-bleeds representing secretly of hypertensive cerebrovascular disease. - MRA showed diseased/occluded left A2 ACA - 2-D echo results pending - Carotid Dopplers pending - Neurology recommended to continue aspirin 325 mg daily - Lipid panel showed LDL 91 , goal less than 70, placed on statin - Hemoglobin A1c  pending - PT evaluation pending  Active Problems:   HTN (hypertension) emergency/malignant hypertension - BP 205/123 on admission - Permissive hypertension for now,  gradually normalize in next 4-5 days  Hyperlipidemia LDL 91, goal less than 70, placed on statin once patient is able to swallow    Nicotine dependence - Counseled strongly on smoking cessation  Cocaine abuse - UDS positive on cocaine, counseled strongly on cocaine cessation    Alcohol abuse - Place on CIWA protocol   Code Status: Full code  Family Communication: Discussed in detail with the patient, all imaging results, lab results explained to the patient   Disposition Plan:  Time Spent in minutes 25 minutes  Procedures  MRI  MRA   Consults   Neurology  DVT Prophylaxis  Lovenox   Medications  Scheduled Meds: .  stroke: mapping our early stages of recovery book   Does not apply Once  . aspirin  300 mg Rectal Daily   Or  . aspirin  325 mg Oral Daily  . enoxaparin (LOVENOX) injection  40 mg Subcutaneous Q24H  . folic acid  1 mg Oral Daily  . multivitamin with minerals  1 tablet Oral Daily  . thiamine  100 mg Oral Daily   Or  . thiamine  100 mg Intravenous Daily   Continuous Infusions: . sodium chloride 75 mL/hr at 07/03/15 1140   PRN Meds:.labetalol, LORazepam **OR** LORazepam, senna-docusate   Antibiotics   Anti-infectives    None        Subjective:   [name] was seen and examined today. Right-sided weakness, still having some slurred speech. Patient denies any chest pain, shortness of breath, abdominal pain, N/V/D/C.  Objective:   Blood pressure 181/104, pulse 65, resp. rate 12, SpO2 95 %.  Wt Readings from Last 3 Encounters:  01/16/13 77.021 kg (169 lb 12.8 oz)    No intake or output data in the 24 hours ending 07/03/15 1309  Exam  General: Alert and oriented x 3, NAD, slurred speech  HEENT:  PERRLA, EOMI, Anicteric Sclera, mucous membranes moist.   Neck:  Supple, no JVD, no masses  CVS: S1 S2 auscultated, no rubs, murmurs or gallops. Regular rate and rhythm.  Respiratory: Clear to auscultation bilaterally, no wheezing, rales or rhonchi  Abdomen: Soft, nontender, nondistended, + bowel sounds  Ext: no cyanosis clubbing or edema  Neuro: AAOx3, strength 5/5 left upper and lower extremity, 0/5 RUE, RLE  Skin: No rashes  Psych: Normal affect and demeanor, alert and oriented x3    Data Review   Micro Results No results found for this or any previous visit (from the past 240 hour(s)).  Radiology Reports Ct Head Wo Contrast  07/02/2015  CLINICAL DATA:  Right-sided paralysis and facial droop. Initial symptoms at 4 a.m. this morning. EXAM: CT HEAD WITHOUT CONTRAST TECHNIQUE: Contiguous axial images were obtained from the base of the skull through the vertex without intravenous contrast. COMPARISON:  Brain MRI from earlier same day. Comparison also to head CT dated 01/15/2013. FINDINGS: There is ill-defined areas of edema within the medial and anterior aspects of the left frontal lobe, new compared to the earlier head CT, compatible with the acute infarction seen on the recent brain MRI. Edema extends inferiorly to the level of left periventricular white matter and the genu of the corpus callosum. There is associated mild sulcal effacement. No midline shift or herniation. No parenchymal hemorrhage. No extra-axial hemorrhage. Additional mild chronic small vessel ischemic change noted within the right periventricular white matter. Osseous structures are unremarkable. Visualized upper paranasal sinuses are clear. Mastoid air cells are clear. Superficial soft tissues are unremarkable. IMPRESSION: 1. Ill-defined edema within the medial and anterior aspects of the left frontal lobe, extending inferiorly to the level of the left periventricular white matter and genu of the corpus callosum, compatible with the acute ACA distribution infarction identified on brain  MRI performed just before this head CT. 2. Associated mild sulcal effacement. No midline shift or herniation. 3. No intracranial hemorrhage. Electronically Signed   By: Franki Cabot M.D.   On: 07/02/2015 21:05   Mr Brain Wo Contrast (neuro Protocol)  07/02/2015  CLINICAL DATA:  Right-sided paralysis with facial droop. Last seen normal yesterday at 8 p.m. EXAM: MRI HEAD WITHOUT CONTRAST MRA HEAD WITHOUT CONTRAST TECHNIQUE: Multiplanar, multiecho pulse sequences of the brain and surrounding structures were obtained without intravenous contrast. Angiographic images of the head were obtained using MRA technique without contrast. COMPARISON:  MRI brain 01/17/2013. FINDINGS: MRI HEAD FINDINGS There is a moderately large area of acute infarction, nonhemorrhagic, affecting the LEFT hemisphere within the LEFT anterior cerebral artery distribution. Both focal and confluent areas of acute infarction affect the medial and superior frontal lobe, parasagittal frontal lobe, and cingulate gyrus, regional white matter, as well  as the genu of the corpus callosum on the LEFT. No MCA territory involvement. No right-sided involvement. No hemorrhage, mass lesion, hydrocephalus, or extra-axial fluid. Premature for age cerebral and cerebellar atrophy. Extensive focal and confluent white matter signal abnormality, predominantly supratentorial but also affecting the brainstem, consistent with chronic microvascular ischemic change. Numerous chronic lacunar infarcts affect the deep nuclei and periventricular white matter, with greatest involvement in the LEFT basal ganglia. Flow voids are maintained in the carotid, basilar, and both vertebral arteries. Tiny foci of chronic hemorrhage can be seen in the cerebellum, brainstem, and RIGHT thalamus, likely sequelae of hypertensive cerebrovascular disease. Pituitary, pineal, and cerebellar tonsils unremarkable. No upper cervical lesions. Visualized calvarium, skull base, and upper cervical  osseous structures unremarkable. Scalp and extracranial soft tissues, orbits, sinuses, and mastoids show no acute process. MRA HEAD FINDINGS The internal carotid arteries are widely patent. The basilar artery is widely patent with vertebrals codominant. No RIGHT or LEFT MCA disease. The RIGHT A1 ACA is mildly irregular without focal narrowing. The LEFT A1 ACA is moderately diseased in its distal A1 segment, estimated 50-75% stenosis. The LEFT A2 ACA is diffusely narrowed, and there is an acute occlusion of the LEFT anterior cerebral artery in its mid A2 segment just proximal to the origin of the frontopolar branch. Fetal origin LEFT PCA. Mildly diseased RIGHT PCA and severely narrowed LEFT PCA suggesting intracranial atherosclerotic disease. Moderately irregular BILATERAL MCA branches also consistent with intracranial atherosclerotic disease. No cerebellar branch occlusion.  No intracranial aneurysm. IMPRESSION: Acute nonhemorrhagic LEFT anterior cerebral artery territory infarction affecting the medial and anterior frontal lobe including the cingulate gyrus and regional white matter. 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 No large vessel occlusion is evident. Electronically Signed   By: Staci Righter M.D.   On: 07/02/2015 20:55   Mr Virgel Paling (cerebral Arteries)  07/02/2015  CLINICAL DATA:  Right-sided paralysis with facial droop. Last seen normal yesterday at 8 p.m. EXAM: MRI HEAD WITHOUT CONTRAST MRA HEAD WITHOUT CONTRAST TECHNIQUE: Multiplanar, multiecho pulse sequences of the brain and surrounding structures were obtained without intravenous contrast. Angiographic images of the head were obtained using MRA technique without contrast. COMPARISON:  MRI brain 01/17/2013. FINDINGS: MRI  HEAD FINDINGS There is a moderately large area of acute infarction, nonhemorrhagic, affecting the LEFT hemisphere within the LEFT anterior cerebral artery distribution. Both focal and confluent areas of acute infarction affect the medial and superior frontal lobe, parasagittal frontal lobe, and cingulate gyrus, regional white matter, as well as the genu of the corpus callosum on the LEFT. No MCA territory involvement. No right-sided involvement. No hemorrhage, mass lesion, hydrocephalus, or extra-axial fluid. Premature for age cerebral and cerebellar atrophy. Extensive focal and confluent white matter signal abnormality, predominantly supratentorial but also affecting the brainstem, consistent with chronic microvascular ischemic change. Numerous chronic lacunar infarcts affect the deep nuclei and periventricular white matter, with greatest involvement in the LEFT basal ganglia. Flow voids are maintained in the carotid, basilar, and both vertebral arteries. Tiny foci of chronic hemorrhage can be seen in the cerebellum, brainstem, and RIGHT thalamus, likely sequelae of hypertensive cerebrovascular disease. Pituitary, pineal, and cerebellar tonsils unremarkable. No upper cervical lesions. Visualized calvarium, skull base, and upper cervical osseous structures unremarkable. Scalp and extracranial soft tissues, orbits, sinuses, and mastoids show no acute process. MRA HEAD FINDINGS The internal carotid arteries are  widely patent. The basilar artery is widely patent with vertebrals codominant. No RIGHT or LEFT MCA disease. The RIGHT A1 ACA is mildly irregular without focal narrowing. The LEFT A1 ACA is moderately diseased in its distal A1 segment, estimated 50-75% stenosis. The LEFT A2 ACA is diffusely narrowed, and there is an acute occlusion of the LEFT anterior cerebral artery in its mid A2 segment just proximal to the origin of the frontopolar branch. Fetal origin LEFT PCA. Mildly diseased RIGHT PCA and severely  narrowed LEFT PCA suggesting intracranial atherosclerotic disease. Moderately irregular BILATERAL MCA branches also consistent with intracranial atherosclerotic disease. No cerebellar branch occlusion.  No intracranial aneurysm. IMPRESSION: Acute nonhemorrhagic LEFT anterior cerebral artery territory infarction affecting the medial and anterior frontal lobe including the cingulate gyrus and regional white matter. 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 No large vessel occlusion is evident. Electronically Signed   By: Staci Righter M.D.   On: 07/02/2015 20:55    CBC  Recent Labs Lab 07/02/15 1922 07/02/15 1925 07/03/15 0311  WBC 13.4*  --  10.7*  HGB 15.0 17.0 14.0  HCT 44.5 50.0 42.2  PLT 273  --  276  MCV 96.1  --  94.4  MCH 32.4  --  31.3  MCHC 33.7  --  33.2  RDW 12.9  --  12.9  LYMPHSABS 1.0  --   --   MONOABS 0.6  --   --   EOSABS 0.0  --   --   BASOSABS 0.0  --   --     Chemistries   Recent Labs Lab 07/02/15 1922 07/02/15 1925 07/03/15 0311  NA 137 139 137  K 4.0 3.9 3.9  CL 105 103 105  CO2 21*  --  22  GLUCOSE 110* 107* 115*  BUN 9 11 8   CREATININE 0.90 0.70 0.92  CALCIUM 9.5  --  9.1  AST 32  --  28  ALT 23  --  22  ALKPHOS 57  --  53  BILITOT 1.2  --  1.5*   ------------------------------------------------------------------------------------------------------------------ CrCl cannot be calculated (Unknown ideal weight.). ------------------------------------------------------------------------------------------------------------------ No results for input(s): HGBA1C in the last 72 hours. ------------------------------------------------------------------------------------------------------------------  Recent Labs  07/03/15 0311   CHOL 172  HDL 72  LDLCALC 91  TRIG 44  CHOLHDL 2.4   ------------------------------------------------------------------------------------------------------------------ No results for input(s): TSH, T4TOTAL, T3FREE, THYROIDAB in the last 72 hours.  Invalid input(s): FREET3 ------------------------------------------------------------------------------------------------------------------ No results for input(s): VITAMINB12, FOLATE, FERRITIN, TIBC, IRON, RETICCTPCT in the last 72 hours.  Coagulation profile  Recent Labs Lab 07/02/15 1922  INR 1.17    No results for input(s): DDIMER in the last 72 hours.  Cardiac Enzymes No results for input(s): CKMB, TROPONINI, MYOGLOBIN in the last 168 hours.  Invalid input(s): CK ------------------------------------------------------------------------------------------------------------------ Invalid input(s): POCBNP   Recent Labs  07/02/15 2254  GLUCAP 120*     Sona Nations M.D. [facility] 07/03/2015, 1:09 PM  Pager: [phone] Between 7am to 7pm - call Pager - [phone]  After 7pm go to [email or web address] - password [email or web address]  Call night coverage person covering after 7pm

## 2015-07-03 NOTE — Progress Notes (Signed)
PT Cancellation Note  Patient Details Name: Gerald [name] MRN: [id number] DOB: 04/08/1961   Cancelled Treatment:    Reason Eval/Treat Not Completed: Patient not medically ready. Current BP 189/119. RN made aware.  Carollynn Pennywell 07/03/2015, 3:28 PM Pager [phone]

## 2015-07-04 ENCOUNTER — Ambulatory Visit (HOSPITAL_COMMUNITY): Payer: Medicaid Other

## 2015-07-04 DIAGNOSIS — I639 Cerebral infarction, unspecified: Secondary | ICD-10-CM

## 2015-07-04 LAB — GLUCOSE, CAPILLARY
Glucose-Capillary: 111 mg/dL — ABNORMAL HIGH (ref 65–99)
Glucose-Capillary: 91 mg/dL (ref 65–99)
Glucose-Capillary: 91 mg/dL (ref 65–99)
Glucose-Capillary: 138 mg/dL — ABNORMAL HIGH (ref 65–99)
Glucose-Capillary: 104 mg/dL — ABNORMAL HIGH (ref 65–99)

## 2015-07-04 LAB — HEMOGLOBIN A1C
Hgb A1c MFr Bld: 5.3 % (ref 4.8–5.6)
Mean Plasma Glucose: 105 mg/dL

## 2015-07-04 MED ORDER — CHLORHEXIDINE GLUCONATE 0.12 % MT SOLN
15.0000 mL | Freq: Two times a day (BID) | OROMUCOSAL | Status: DC
  Administered 2015-07-04 – 2015-07-08 (×9): 15 mL via OROMUCOSAL
  Filled 2015-07-04 (×6): qty 15

## 2015-07-04 MED ORDER — CETYLPYRIDINIUM CHLORIDE 0.05 % MT LIQD
7.0000 mL | Freq: Two times a day (BID) | OROMUCOSAL | Status: DC
  Administered 2015-07-04 – 2015-07-07 (×7): 7 mL via OROMUCOSAL

## 2015-07-04 MED ORDER — HYDRALAZINE HCL 20 MG/ML IJ SOLN
10.0000 mg | Freq: Four times a day (QID) | INTRAMUSCULAR | Status: DC | PRN
  Administered 2015-07-04 – 2015-07-06 (×2): 10 mg via INTRAVENOUS
  Filled 2015-07-04 (×2): qty 1

## 2015-07-04 MED ORDER — LISINOPRIL 20 MG PO TABS
20.0000 mg | ORAL_TABLET | Freq: Every day | ORAL | Status: DC
  Administered 2015-07-04 – 2015-07-07 (×4): 20 mg via ORAL
  Filled 2015-07-04 (×4): qty 1

## 2015-07-04 NOTE — Progress Notes (Signed)
Rehab Admissions Coordinator Note:  Patient was screened by Retta Diones for appropriateness for an Inpatient Acute Rehab Consult.  At this time, we are recommending Inpatient Rehab consult.  Retta Diones 07/04/2015, 4:37 PM  I can be reached at [phone].

## 2015-07-04 NOTE — Progress Notes (Signed)
[facility]                                                                              Patient Demographics  Gerald [name], is a 55 y.o. male, DOB - 03-13-1961, [id number]  Admit date - 07/02/2015   Admitting Physician Edwin Dada, MD  Outpatient Primary MD for the patient is No PCP Per Patient  LOS - 2   Chief Complaint  Patient presents with  . Stroke Symptoms       Brief HPI   Gerald [name] is a 55 y.o. male with a past medical history significant for HTN, smoking, and CVA in 2014 who presented with right sided weakness. Patient was in his normal state of health until he went to the bed the night before the admission, woke up at 4 AM feeling right-sided weakness. In ED, he was hypertensive greater than 200/120, coags were normal, troponin was normal, renal function was normal, and a CT head showed left-sided changes. A follow-up MRI MRA showed acute nonhemorrhagic CVA in the left ACA distribution affecting medial, superior frontal lobe, parasagittal frontal lobe, and cingulate gyrus. Neurology evaluated the patient.Patient was admitted for further workup   Assessment & Plan    Principal Problem:  Acute CVA- presenting with right-sided hemiparesis, slurred speech - Delayed presentation hence not a TPA candidate. Neurology was consulted. - MRI brain showed acute nonhemorrhagic left anterior cerebral artery territory infarction affecting the medial and anterior frontal lobe including the single regardless, additional white matter. Advanced chronic microvascular ischemic changes, numerous lacunar infarcts, chronic micro-bleeds representing secretly of hypertensive cerebrovascular disease. - MRA showed diseased/occluded left A2 ACA - 2-D echo results EF 0000000, grade 1 diastolic dysfunction - Carotid Dopplers 1-39% ICA stenosis  - Neurology recommended to continue aspirin 325 mg daily - Lipid panel showed LDL 91 , goal less than 70, placed on  statin - Hemoglobin A1c pending - PT evaluation pending  Active Problems:   HTN (hypertension) emergency/malignant hypertension - BP 205/123 on admission - Permissive hypertension for now, gradually normalize in next 4-5 days - Added lisinopril 20 mg daily, hydralazine as needed with parameters  Hyperlipidemia LDL 91, goal less than 70, placed on statin once patient is able to swallow    Nicotine dependence - Counseled strongly on smoking cessation  Cocaine abuse - UDS positive on cocaine, counseled strongly on cocaine cessation    Alcohol abuse - Place on CIWA protocol   Code Status: Full code  Family Communication: Discussed in detail with the patient, all imaging results, lab results explained to the patient   Disposition Plan:    Time Spent in minutes 25 minutes  Procedures  MRI  MRA  2-D echo, carotid Dopplers  Consults   Neurology  DVT Prophylaxis  Lovenox   Medications  Scheduled Meds: .  stroke: mapping our early stages of recovery book   Does not apply Once  . antiseptic oral rinse  7 mL Mouth Rinse q12n4p  . aspirin  300 mg Rectal Daily   Or  . aspirin  325 mg Oral Daily  . atorvastatin  20 mg Oral q1800  .  chlorhexidine  15 mL Mouth Rinse BID  . enoxaparin (LOVENOX) injection  40 mg Subcutaneous Q24H  . folic acid  1 mg Oral Daily  . Influenza vac split quadrivalent PF  0.5 mL Intramuscular Tomorrow-1000  . lisinopril  20 mg Oral Daily  . multivitamin with minerals  1 tablet Oral Daily  . thiamine  100 mg Oral Daily   Or  . thiamine  100 mg Intravenous Daily   Continuous Infusions: . sodium chloride 75 mL/hr at 07/04/15 0108   PRN Meds:.hydrALAZINE, LORazepam **OR** LORazepam, senna-docusate   Antibiotics   Anti-infectives    None        Subjective:   Gerald [name] was seen and examined today. Persisting Right-sided weakness, still having some slurred speech. Patient denies any chest pain, shortness of breath, abdominal pain,  N/V/D/C.  Objective:   Blood pressure 181/105, pulse 72, temperature 98.6 F (37 C), temperature source Oral, resp. rate 20, height 6' (1.829 m), weight 77.066 kg (169 lb 14.4 oz), SpO2 100 %.  Wt Readings from Last 3 Encounters:  07/03/15 77.066 kg (169 lb 14.4 oz)  01/16/13 77.021 kg (169 lb 12.8 oz)     Intake/Output Summary (Last 24 hours) at 07/04/15 1438 Last data filed at 07/04/15 0918  Gross per 24 hour  Intake 1377.5 ml  Output    400 ml  Net  977.5 ml    Exam  General: Alert and oriented x 3, NAD, slurred speech  HEENT:  PERRLA, EOMI, Anicteric Sclera, mucous membranes moist.   Neck: Supple, no JVD, no masses  CVS: S1 S2 auscultated, no rubs, murmurs or gallops. Regular rate and rhythm.  Respiratory: Clear to auscultation bilaterally, no wheezing, rales or rhonchi  Abdomen: Soft, nontender, nondistended, + bowel sounds  Ext: no cyanosis clubbing or edema  Neuro: AAOx3, strength 5/5 left upper and lower extremity, 0/5 RUE, RLE  Skin: No rashes  Psych: Normal affect and demeanor, alert and oriented x3    Data Review   Micro Results No results found for this or any previous visit (from the past 240 hour(s)).  Radiology Reports Ct Head Wo Contrast  07/02/2015  CLINICAL DATA:  Right-sided paralysis and facial droop. Initial symptoms at 4 a.m. this morning. EXAM: CT HEAD WITHOUT CONTRAST TECHNIQUE: Contiguous axial images were obtained from the base of the skull through the vertex without intravenous contrast. COMPARISON:  Brain MRI from earlier same day. Comparison also to head CT dated 01/15/2013. FINDINGS: There is ill-defined areas of edema within the medial and anterior aspects of the left frontal lobe, new compared to the earlier head CT, compatible with the acute infarction seen on the recent brain MRI. Edema extends inferiorly to the level of left periventricular white matter and the genu of the corpus callosum. There is associated mild sulcal  effacement. No midline shift or herniation. No parenchymal hemorrhage. No extra-axial hemorrhage. Additional mild chronic small vessel ischemic change noted within the right periventricular white matter. Osseous structures are unremarkable. Visualized upper paranasal sinuses are clear. Mastoid air cells are clear. Superficial soft tissues are unremarkable. IMPRESSION: 1. Ill-defined edema within the medial and anterior aspects of the left frontal lobe, extending inferiorly to the level of the left periventricular white matter and genu of the corpus callosum, compatible with the acute ACA distribution infarction identified on brain MRI performed just before this head CT. 2. Associated mild sulcal effacement. No midline shift or herniation. 3. No intracranial hemorrhage. Electronically Signed   By: Franki Cabot  M.D.   On: 07/02/2015 21:05   Mr Brain Wo Contrast (neuro Protocol)  07/02/2015  CLINICAL DATA:  Right-sided paralysis with facial droop. Last seen normal yesterday at 8 p.m. EXAM: MRI HEAD WITHOUT CONTRAST MRA HEAD WITHOUT CONTRAST TECHNIQUE: Multiplanar, multiecho pulse sequences of the brain and surrounding structures were obtained without intravenous contrast. Angiographic images of the head were obtained using MRA technique without contrast. COMPARISON:  MRI brain 01/17/2013. FINDINGS: MRI HEAD FINDINGS There is a moderately large area of acute infarction, nonhemorrhagic, affecting the LEFT hemisphere within the LEFT anterior cerebral artery distribution. Both focal and confluent areas of acute infarction affect the medial and superior frontal lobe, parasagittal frontal lobe, and cingulate gyrus, regional white matter, as well as the genu of the corpus callosum on the LEFT. No MCA territory involvement. No right-sided involvement. No hemorrhage, mass lesion, hydrocephalus, or extra-axial fluid. Premature for age cerebral and cerebellar atrophy. Extensive focal and confluent white matter signal  abnormality, predominantly supratentorial but also affecting the brainstem, consistent with chronic microvascular ischemic change. Numerous chronic lacunar infarcts affect the deep nuclei and periventricular white matter, with greatest involvement in the LEFT basal ganglia. Flow voids are maintained in the carotid, basilar, and both vertebral arteries. Tiny foci of chronic hemorrhage can be seen in the cerebellum, brainstem, and RIGHT thalamus, likely sequelae of hypertensive cerebrovascular disease. Pituitary, pineal, and cerebellar tonsils unremarkable. No upper cervical lesions. Visualized calvarium, skull base, and upper cervical osseous structures unremarkable. Scalp and extracranial soft tissues, orbits, sinuses, and mastoids show no acute process. MRA HEAD FINDINGS The internal carotid arteries are widely patent. The basilar artery is widely patent with vertebrals codominant. No RIGHT or LEFT MCA disease. The RIGHT A1 ACA is mildly irregular without focal narrowing. The LEFT A1 ACA is moderately diseased in its distal A1 segment, estimated 50-75% stenosis. The LEFT A2 ACA is diffusely narrowed, and there is an acute occlusion of the LEFT anterior cerebral artery in its mid A2 segment just proximal to the origin of the frontopolar branch. Fetal origin LEFT PCA. Mildly diseased RIGHT PCA and severely narrowed LEFT PCA suggesting intracranial atherosclerotic disease. Moderately irregular BILATERAL MCA branches also consistent with intracranial atherosclerotic disease. No cerebellar branch occlusion.  No intracranial aneurysm. IMPRESSION: Acute nonhemorrhagic LEFT anterior cerebral artery territory infarction affecting the medial and anterior frontal lobe including the cingulate gyrus and regional white matter. 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 No large vessel occlusion is evident. Electronically Signed   By: Staci Righter M.D.   On: 07/02/2015 20:55   Mr Virgel Paling (cerebral Arteries)  07/02/2015  CLINICAL DATA:  Right-sided paralysis with facial droop. Last seen normal yesterday at 8 p.m. EXAM: MRI HEAD WITHOUT CONTRAST MRA HEAD WITHOUT CONTRAST TECHNIQUE: Multiplanar, multiecho pulse sequences of the brain and surrounding structures were obtained without intravenous contrast. Angiographic images of the head were obtained using MRA technique without contrast. COMPARISON:  MRI brain 01/17/2013. FINDINGS: MRI HEAD FINDINGS There is a moderately large area of acute infarction, nonhemorrhagic, affecting the LEFT hemisphere within the LEFT anterior cerebral artery distribution. Both focal and confluent areas of acute infarction affect the medial and superior frontal lobe, parasagittal frontal lobe, and cingulate gyrus, regional white matter, as well as the genu of the corpus callosum on the LEFT. No MCA territory involvement. No right-sided involvement. No hemorrhage, mass lesion, hydrocephalus,  or extra-axial fluid. Premature for age cerebral and cerebellar atrophy. Extensive focal and confluent white matter signal abnormality, predominantly supratentorial but also affecting the brainstem, consistent with chronic microvascular ischemic change. Numerous chronic lacunar infarcts affect the deep nuclei and periventricular white matter, with greatest involvement in the LEFT basal ganglia. Flow voids are maintained in the carotid, basilar, and both vertebral arteries. Tiny foci of chronic hemorrhage can be seen in the cerebellum, brainstem, and RIGHT thalamus, likely sequelae of hypertensive cerebrovascular disease. Pituitary, pineal, and cerebellar tonsils unremarkable. No upper cervical lesions. Visualized calvarium, skull base, and upper cervical osseous structures unremarkable. Scalp  and extracranial soft tissues, orbits, sinuses, and mastoids show no acute process. MRA HEAD FINDINGS The internal carotid arteries are widely patent. The basilar artery is widely patent with vertebrals codominant. No RIGHT or LEFT MCA disease. The RIGHT A1 ACA is mildly irregular without focal narrowing. The LEFT A1 ACA is moderately diseased in its distal A1 segment, estimated 50-75% stenosis. The LEFT A2 ACA is diffusely narrowed, and there is an acute occlusion of the LEFT anterior cerebral artery in its mid A2 segment just proximal to the origin of the frontopolar branch. Fetal origin LEFT PCA. Mildly diseased RIGHT PCA and severely narrowed LEFT PCA suggesting intracranial atherosclerotic disease. Moderately irregular BILATERAL MCA branches also consistent with intracranial atherosclerotic disease. No cerebellar branch occlusion.  No intracranial aneurysm. IMPRESSION: Acute nonhemorrhagic LEFT anterior cerebral artery territory infarction affecting the medial and anterior frontal lobe including the cingulate gyrus and regional white matter. 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 No large vessel occlusion is evident. Electronically Signed   By: Staci Righter M.D.   On: 07/02/2015 20:55    CBC  Recent Labs Lab 07/02/15 1922 07/02/15 1925 07/03/15 0311  WBC 13.4*  --  10.7*  HGB 15.0 17.0 14.0  HCT 44.5 50.0 42.2  PLT 273  --  276  MCV 96.1  --  94.4  MCH 32.4  --  31.3  MCHC 33.7  --  33.2  RDW 12.9  --  12.9  LYMPHSABS 1.0  --   --   MONOABS 0.6  --   --   EOSABS 0.0  --   --   BASOSABS 0.0  --   --     Chemistries   Recent Labs Lab 07/02/15 1922 07/02/15 1925 07/03/15 0311  NA 137 139 137  K 4.0 3.9 3.9  CL 105 103 105  CO2 21*  --  22  GLUCOSE  110* 107* 115*  BUN 9 11 8   CREATININE 0.90 0.70 0.92  CALCIUM 9.5  --  9.1  AST 32  --  28  ALT 23  --  22  ALKPHOS 57  --  53  BILITOT 1.2  --  1.5*   ------------------------------------------------------------------------------------------------------------------ estimated creatinine clearance is 102.4 mL/min (by C-G formula based on Cr of 0.92). ------------------------------------------------------------------------------------------------------------------  Recent Labs  07/03/15 0312  HGBA1C 5.3   ------------------------------------------------------------------------------------------------------------------  Recent Labs  07/03/15 0311  CHOL 172  HDL 72  LDLCALC 91  TRIG 44  CHOLHDL 2.4   ------------------------------------------------------------------------------------------------------------------ No results for input(s): TSH, T4TOTAL, T3FREE, THYROIDAB in the last 72 hours.  Invalid input(s): FREET3 ------------------------------------------------------------------------------------------------------------------ No results for input(s): VITAMINB12, FOLATE, FERRITIN, TIBC, IRON, RETICCTPCT in the last 72 hours.  Coagulation profile  Recent Labs Lab 07/02/15 1922  INR  1.17    No results for input(s): DDIMER in the last 72 hours.  Cardiac Enzymes No results for input(s): CKMB, TROPONINI, MYOGLOBIN in the last 168 hours.  Invalid input(s): CK ------------------------------------------------------------------------------------------------------------------ Invalid input(s): [place]  07/02/15 2254 07/03/15 1757 07/03/15 2251 07/04/15 0752 07/04/15 1127  GLUCAP 120* 41 107* 84 91     Rashee Marschall M.D. [facility] 07/04/2015, 2:38 PM  Pager: [phone] Between 7am to 7pm - call Pager - [phone]  After 7pm go to [email or web address] - password [email or web address]  Call night coverage person covering after 7pm

## 2015-07-04 NOTE — Progress Notes (Signed)
STROKE TEAM PROGRESS NOTE   HISTORY OF PRESENT ILLNESS Gerald [name] is an 55 y.o. male with a history of hypertension and alcohol use, presenting with new onset hemiplegia and slurred speech. Patient was last known well at 8 PM on 07/01/2015. He has not been compliant with taking antihypertensive medication. Blood pressure was 205/ 123 in the emergency room. He was given IV labetalol for emergency management. CT scan of his head, as well as MRI showed acute left ACA territory ischemic infarction. MRA showed 50-75% stenosis of left A1 segment, as well as diseased/occluded left A2 ACA. NIH stroke score was 15. Patient was not administered TPA secondary to beyond time under for treatment consideration. He was admitted for further evaluation and treatment.   SUBJECTIVE (INTERVAL HISTORY) His sister is at the bedside.  Overall he feels his condition is unchanged. Marland Kitchen Pt minimally conversant, can speak name.    OBJECTIVE Temp:  [98 F (36.7 C)-98.7 F (37.1 C)] 98 F (36.7 C) (01/20 0733) Pulse Rate:  [64-108] 72 (01/20 0733) Cardiac Rhythm:  [-] Normal sinus rhythm (01/20 0701) Resp:  [11-21] 21 (01/20 0733) BP: (181-193)/(107-122) 193/107 mmHg (01/20 0733) SpO2:  [96 %-99 %] 96 % (01/20 0733) Weight:  [169 lb 14.4 oz (77.066 kg)] 169 lb 14.4 oz (77.066 kg) (01/19 1527)  CBC:   Recent Labs Lab 07/02/15 1922 07/02/15 1925 07/03/15 0311  WBC 13.4*  --  10.7*  NEUTROABS 11.8*  --   --   HGB 15.0 17.0 14.0  HCT 44.5 50.0 42.2  MCV 96.1  --  94.4  PLT 273  --  [id number]    Basic Metabolic Panel:   Recent Labs Lab 07/02/15 1922 07/02/15 1925 07/03/15 0311  NA 137 139 137  K 4.0 3.9 3.9  CL 105 103 105  CO2 21*  --  22  GLUCOSE 110* 107* 115*  BUN 9 11 8   CREATININE 0.90 0.70 0.92  CALCIUM 9.5  --  9.1    Lipid Panel:     Component Value Date/Time   CHOL 172 07/03/2015 0311   TRIG 44 07/03/2015 0311   HDL 72 07/03/2015 0311   CHOLHDL 2.4 07/03/2015 0311   VLDL 9 07/03/2015  0311   LDLCALC 91 07/03/2015 0311   HgbA1c:  Lab Results  Component Value Date   HGBA1C 5.3 07/03/2015   Urine Drug Screen:     Component Value Date/Time   LABOPIA NONE DETECTED 07/02/2015 2130   COCAINSCRNUR POSITIVE* 07/02/2015 2130   LABBENZ NONE DETECTED 07/02/2015 2130   AMPHETMU NONE DETECTED 07/02/2015 2130   THCU NONE DETECTED 07/02/2015 2130   LABBARB NONE DETECTED 07/02/2015 2130      IMAGING  Ct Head Wo Contrast 07/02/2015   1. Ill-defined edema within the medial and anterior aspects of the left frontal lobe, extending inferiorly to the level of the left periventricular white matter and genu of the corpus callosum, compatible with the acute ACA distribution infarction identified on brain MRI performed just before this head CT. 2. Associated mild sulcal effacement. No midline shift or herniation. 3. No intracranial hemorrhage.   Mr Brain Wo Contrast (neuro Protocol) 07/02/2015  Acute nonhemorrhagic LEFT anterior cerebral artery territory infarction affecting the medial and anterior frontal lobe including the cingulate gyrus and regional white matter. 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  Mr Virgel Paling (cerebral Arteries) 07/02/2015 Intracranial atherosclerotic disease with diseased/occluded  LEFT A2 ACA. No large vessel occlusion is evident.   2D Echocardiogram Left ventricle: The cavity size was normal. Wall thickness was increased in a pattern of moderate LVH. Systolic function was normal. The estimated ejection fraction was in the range of 55% to 60%. Wall motion was normal; there were no regional wall motion abnormalities.   Carotid Doppler  No significant stenosis   PHYSICAL EXAM Pleasant middle-aged African-American male currently not in distress. . Afebrile. Head is nontraumatic. Neck is supple  without bruit.    Cardiac exam no murmur or gallop. Lungs are clear to auscultation. Distal pulses are well felt. Neurological Exam :  Awake alert oriented 1. Diminished attention, registration and recall. Mild dysarthria. Speech is nonfluent and few words only.. Follows commands well. Extraocular movements are full range without nystagmus. Vision acuity and fields seem adequate. Right lower facial weakness. Tongue is midline. Jaw jerk is brisk. Dense right hemiplegia with increased tone 1/5. Normal strength on the left side. Diminished right hemibody sensation. Gait was not tested. ASSESSMENT/PLAN Mr. Gerald [name] is a 55 y.o. male with history of HTN and alcohol use presenting with right hemiplegia and slurred speech. He did not receive IV t-PA due to delay in arrival.   Stroke:  Dominant left ACA/MCA infarct  secondary to unknown source, work up underway  Resultant  R hemiplegia, dysarthria, dysphagia  MRI  L ACA/MCA infarct. Advanced atherosclerotic disease  MRA  L A2 atherosclerosis  Carotid Doppler  pending   2D Echo  pending   Increase IVF to 75/hr  LDL 91  HgbA1c pending  SCDs for VTE prophylaxis. Will add lovenox. Diet NPO time specified  aspirin 325 mg daily prior to admission, now on aspirin 300 mg suppository daily  Ongoing aggressive stroke risk factor management  Therapy recommendations:  pending   Disposition:  pending   Hypertensive Emergency  BP 205/ 123 on arrival  Improving  Permissive hypertension (OK if < 220/120) but gradually normalize in 5-7 days  Hyperlipidemia  Home meds:  No statin  LDL 91, goal < 70  Add statin once pt able to swallow  Other Stroke Risk Factors  Cigarette smoker, advised to stop smoking  Cocaine use, UDS present on admission , advised to stop useing  ETOH use  Hx stroke/TIA  01/2013 L basal ganglia infarct d/t small vessel disease  Hospital day # [place] for  Pager information 07/04/2015 1:13 PM  I have personally examined this patient, reviewed notes, independently viewed imaging studies, participated in medical decision making and plan of care. I have made any additions or clarifications directly to the above note.  He presented with slurred speech and right hemiplegia due to large left anterior cerebral artery infarct etiology likely intracranial atherosclerosis.  Marland Kitchen He will likely need inpatient rehabilitation transfer. I have counseled the patient to quit smoking and drinking alcohol. Stroke team will sign off. Currently call for questions. Follow-up as an outpatient in stroke clinic with nurse practitioner in 2 months  Antony Contras, [place] Pager: [phone] 07/04/2015 1:13 PM    To contact Stroke Continuity provider, please refer to [email or web address] After hours, contact General Neurology

## 2015-07-04 NOTE — Progress Notes (Signed)
OT Cancellation Note  Patient Details Name: ED ANNESS MRN: [id number] DOB: [birth year]   Cancelled Treatment:    Reason Eval/Treat Not Completed: Medical issues which prohibited therapy;Other (comment) (Pt not medically stable for OT intervention with BP of 193/107.) RN is aware.  [name] 07/04/2015, 7:52 AM

## 2015-07-04 NOTE — Progress Notes (Signed)
VASCULAR LAB PRELIMINARY  PRELIMINARY  PRELIMINARY  PRELIMINARY  Carotid duplex  completed.    Preliminary report:  Bilateral:  1-39% ICA stenosis.  Vertebral artery flow is antegrade.      Domingo Fuson, RVT 07/04/2015, 12:38 PM

## 2015-07-04 NOTE — Evaluation (Signed)
Physical Therapy Evaluation Patient Details Name: Gerald [name] MRN: [id number] DOB: 04-16-1961 Today's Date: 07/04/2015   History of Present Illness  Pt adm with rt hemiplegia. MRI positive for Lt CVA. PMH - HTN, CVA's  Clinical Impression  Pt admitted with above diagnosis and presents to PT with functional limitations due to deficits listed below (See PT problem list). Pt needs skilled PT to maximize independence and safety to allow discharge to CIR. Per MD notes they are allowing permissive HTN and therefore able to see pt despite elevated BP.     Follow Up Recommendations CIR    Equipment Recommendations  Other (comment) (to be assessed)    Recommendations for Other Services Rehab consult     Precautions / Restrictions Precautions Precautions: Fall Restrictions Weight Bearing Restrictions: No      Mobility  Bed Mobility Overal bed mobility: Needs Assistance Bed Mobility: Supine to Sit;Sit to Supine;Rolling Rolling: Min assist;Max assist   Supine to sit: Max assist Sit to supine: Max assist   General bed mobility comments: Assist to move RLE and RUE and to elevate trunk.Rolling to rt with min A and to  left with max assist  Transfers                 General transfer comment: Unable to stand with 1 person assist  Ambulation/Gait                Stairs            Wheelchair Mobility    Modified Rankin (Stroke Patients Only) Modified Rankin (Stroke Patients Only) Pre-Morbid Rankin Score: No symptoms Modified Rankin: Severe disability     Balance Overall balance assessment: Needs assistance Sitting-balance support: Single extremity supported;Feet supported Sitting balance-Leahy Scale: Poor Sitting balance - Comments: Sat EOB x 15 minutes with mod to min guard assist. Initially pt pushing with LUE resulting in rt and posterior lean.  Eventually able to hold onto rail with LUE and sit with min guard assist for short periods. Postural  control: Posterior lean;Right lateral lean                                   Pertinent Vitals/Pain Pain Assessment: Faces Faces Pain Scale: No hurt    Home Living Family/patient expects to be discharged to:: Private residence Living Arrangements: Spouse/significant other Available Help at Discharge: Family;Available 24 hours/day Type of Home: Apartment Home Access: Stairs to enter Entrance Stairs-Rails: Left Entrance Stairs-Number of Steps: 3 Home Layout: One level Home Equipment: None Additional Comments: Information from prior encounter    Prior Function Level of Independence: Independent               Hand Dominance   Dominant Hand: Right    Extremity/Trunk Assessment   Upper Extremity Assessment: Defer to OT evaluation           Lower Extremity Assessment: RLE deficits/detail RLE Deficits / Details: No active movement.        Communication   Communication: Expressive difficulties  Cognition Arousal/Alertness: Awake/alert Behavior During Therapy: Flat affect Overall Cognitive Status: Difficult to assess                      General Comments      Exercises        Assessment/Plan    PT Assessment Patient needs continued PT services  PT Diagnosis Hemiplegia dominant side;Difficulty walking  PT Problem List Decreased strength;Decreased balance;Decreased mobility;Decreased cognition;Decreased knowledge of use of DME  PT Treatment Interventions DME instruction;Gait training;Functional mobility training;Therapeutic activities;Therapeutic exercise;Balance training;Neuromuscular re-education;Cognitive remediation;Patient/family education;Wheelchair mobility training   PT Goals (Current goals can be found in the Care Plan section) Acute Rehab PT Goals Patient Stated Goal: Pt unable to state PT Goal Formulation: Patient unable to participate in goal setting Time For Goal Achievement: 07/18/15 Potential to Achieve Goals: Fair     Frequency Min 4X/week   Barriers to discharge Inaccessible home environment stairs to enter     Co-evaluation               End of Session Equipment Utilized During Treatment: Gait belt Activity Tolerance: Patient tolerated treatment well Patient left: in bed;with call bell/phone within reach;with bed alarm set Nurse Communication: Mobility status;Need for lift equipment         Time: 1120-1153 PT Time Calculation (min) (ACUTE ONLY): 33 min   Charges:   PT Evaluation $PT Eval Moderate Complexity: 1 Procedure PT Treatments $Therapeutic Activity: 8-22 mins     PT G Codes:        Makyiah Lie 07/20/2015, 2:57 PM [facility] PT [phone]

## 2015-07-04 NOTE — Progress Notes (Signed)
MD notified that patient had run of SVT and is now back in normal sinus rhythm. Will continue to monitor patient.
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
## 2015-07-05 DIAGNOSIS — I6329 Cerebral infarction due to unspecified occlusion or stenosis of other precerebral arteries: Secondary | ICD-10-CM

## 2015-07-05 LAB — BASIC METABOLIC PANEL
Anion gap: 8 (ref 5–15)
BUN: 8 mg/dL (ref 6–20)
CO2: 24 mmol/L (ref 22–32)
Calcium: 9 mg/dL (ref 8.9–10.3)
Chloride: 108 mmol/L (ref 101–111)
Creatinine, Ser: 0.95 mg/dL (ref 0.61–1.24)
GFR calc Af Amer: >60 mL/min (ref >60)
GFR calc non Af Amer: >60 mL/min (ref >60)
Glucose, Bld: 96 mg/dL (ref 65–99)
Potassium: 3.6 mmol/L (ref 3.5–5.1)
Sodium: 140 mmol/L (ref 135–145)

## 2015-07-05 LAB — CBC
HCT: 42.1 % (ref 39.0–52.0)
Hemoglobin: 14.2 g/dL (ref 13.0–17.0)
MCH: 32.1 pg (ref 26.0–34.0)
MCHC: 33.7 g/dL (ref 30.0–36.0)
MCV: 95.2 fL (ref 78.0–100.0)
Platelets: 282 10*3/uL (ref 150–400)
RBC: 4.42 MIL/uL (ref 4.22–5.81)
RDW: 12.7 % (ref 11.5–15.5)
WBC: 7.7 10*3/uL (ref 4.0–10.5)

## 2015-07-05 LAB — GLUCOSE, CAPILLARY
Glucose-Capillary: 95 mg/dL (ref 65–99)
Glucose-Capillary: 97 mg/dL (ref 65–99)
Glucose-Capillary: 88 mg/dL (ref 65–99)
Glucose-Capillary: 105 mg/dL — ABNORMAL HIGH (ref 65–99)

## 2015-07-05 MED ORDER — AMLODIPINE BESYLATE 5 MG PO TABS
5.0000 mg | ORAL_TABLET | Freq: Every day | ORAL | Status: DC
  Administered 2015-07-05 – 2015-07-07 (×3): 5 mg via ORAL
  Filled 2015-07-05 (×2): qty 1

## 2015-07-05 NOTE — Progress Notes (Signed)
[facility]                                                                              Patient Demographics  Gerald [name], is a 55 y.o. male, DOB - [birth year], [id number]  Admit date - 07/02/2015   Admitting Physician Edwin Dada, MD  Outpatient Primary MD for the patient is No PCP Per Patient  LOS - 3   Chief Complaint  Patient presents with  . Stroke Symptoms       Brief HPI   Gerald [name] is a 55 y.o. male with a past medical history significant for HTN, smoking, and CVA in 2014 who presented with right sided weakness. Patient was in his normal state of health until he went to the bed the night before the admission, woke up at 4 AM feeling right-sided weakness. In ED, he was hypertensive greater than 200/120, coags were normal, troponin was normal, renal function was normal, and a CT head showed left-sided changes. A follow-up MRI MRA showed acute nonhemorrhagic CVA in the left ACA distribution affecting medial, superior frontal lobe, parasagittal frontal lobe, and cingulate gyrus. Neurology evaluated the patient.Patient was admitted for further workup   Assessment & Plan    Principal Problem:  Acute CVA- presenting with right-sided hemiparesis, slurred speech - Delayed presentation hence not a TPA candidate. Neurology was consulted. - MRI brain showed acute nonhemorrhagic left anterior cerebral artery territory infarction affecting the medial and anterior frontal lobe including the single regardless, additional white matter. Advanced chronic microvascular ischemic changes, numerous lacunar infarcts, chronic micro-bleeds representing secretly of hypertensive cerebrovascular disease. - MRA showed diseased/occluded left A2 ACA - 2-D echo results EF 0000000, grade 1 diastolic dysfunction - Carotid Dopplers 1-39% ICA stenosis  - Neurology recommended to continue aspirin 325 mg daily, patient was on aspirin 325 PTA, transition to Plavix?, Will  follow neurology recommendations. Patient passed swallow testing, started on heart healthy diet. - Lipid panel showed LDL 91 , goal less than 70, placed on statin - Hemoglobin A1c 5.3 - PT evaluation -> inpatient rehabilitation  Active Problems:   HTN (hypertension) emergency/malignant hypertension - BP 205/123 on admission - Added lisinopril 20 mg daily, hydralazine as needed with parameters - add norvasc 5mg  daily  Hyperlipidemia LDL 91, goal less than 70, placed on statin once patient is able to swallow    Nicotine dependence - Counseled strongly on smoking cessation  Cocaine abuse - UDS positive on cocaine, counseled strongly on cocaine cessation    Alcohol abuse - Place on CIWA protocol   Code Status: Full code  Family Communication: Discussed in detail with the patient, all imaging results, lab results explained to the patient and wife at the bedside  Disposition Plan: CIR when bed available   Time Spent in minutes 25 minutes  Procedures  MRI  MRA  2-D echo, carotid Dopplers  Consults   Neurology  DVT Prophylaxis  Lovenox   Medications  Scheduled Meds: . antiseptic oral rinse  7 mL Mouth Rinse q12n4p  . aspirin  300 mg Rectal Daily   Or  . aspirin  325 mg Oral Daily  .  atorvastatin  20 mg Oral q1800  . chlorhexidine  15 mL Mouth Rinse BID  . enoxaparin (LOVENOX) injection  40 mg Subcutaneous Q24H  . folic acid  1 mg Oral Daily  . lisinopril  20 mg Oral Daily  . multivitamin with minerals  1 tablet Oral Daily  . thiamine  100 mg Oral Daily   Continuous Infusions: . sodium chloride 75 mL/hr at 07/05/15 0248   PRN Meds:.hydrALAZINE, LORazepam **OR** LORazepam, senna-docusate   Antibiotics   Anti-infectives    None        Subjective:   Gerald [name] was seen and examined today. Persisting Right-sided weakness. Patient denies any chest pain, shortness of breath, abdominal pain, N/V/D/C.  Objective:   Blood pressure 164/86, pulse 78,  temperature 98.5 F (36.9 C), temperature source Oral, resp. rate 20, height 6' (1.829 m), weight 77.066 kg (169 lb 14.4 oz), SpO2 96 %.  Wt Readings from Last 3 Encounters:  07/03/15 77.066 kg (169 lb 14.4 oz)  01/16/13 77.021 kg (169 lb 12.8 oz)     Intake/Output Summary (Last 24 hours) at 07/05/15 1252 Last data filed at 07/05/15 1118  Gross per 24 hour  Intake 2208.75 ml  Output    900 ml  Net 1308.75 ml    Exam  General: Alert and oriented x 3, NAD, slurred speech  HEENT:  PERRLA, EOMI  Neck: Supple, no JVD, no masses  CVS: S1 S2 clear, RRR  Respiratory: CTAB  Abdomen: Soft, NT, ND, NBS  Ext: no cyanosis clubbing or edema  Neuro: AAOx3, strength 5/5 left upper and lower extremity, 0/5 RUE, RLE  Skin: No rashes  Psych: Normal affect and demeanor, alert and oriented x3    Data Review   Micro Results No results found for this or any previous visit (from the past 240 hour(s)).  Radiology Reports Ct Head Wo Contrast  07/02/2015  CLINICAL DATA:  Right-sided paralysis and facial droop. Initial symptoms at 4 a.m. this morning. EXAM: CT HEAD WITHOUT CONTRAST TECHNIQUE: Contiguous axial images were obtained from the base of the skull through the vertex without intravenous contrast. COMPARISON:  Brain MRI from earlier same day. Comparison also to head CT dated 01/15/2013. FINDINGS: There is ill-defined areas of edema within the medial and anterior aspects of the left frontal lobe, new compared to the earlier head CT, compatible with the acute infarction seen on the recent brain MRI. Edema extends inferiorly to the level of left periventricular white matter and the genu of the corpus callosum. There is associated mild sulcal effacement. No midline shift or herniation. No parenchymal hemorrhage. No extra-axial hemorrhage. Additional mild chronic small vessel ischemic change noted within the right periventricular white matter. Osseous structures are unremarkable. Visualized  upper paranasal sinuses are clear. Mastoid air cells are clear. Superficial soft tissues are unremarkable. IMPRESSION: 1. Ill-defined edema within the medial and anterior aspects of the left frontal lobe, extending inferiorly to the level of the left periventricular white matter and genu of the corpus callosum, compatible with the acute ACA distribution infarction identified on brain MRI performed just before this head CT. 2. Associated mild sulcal effacement. No midline shift or herniation. 3. No intracranial hemorrhage. Electronically Signed   By: Franki Cabot M.D.   On: 07/02/2015 21:05   Mr Brain Wo Contrast (neuro Protocol)  07/02/2015  CLINICAL DATA:  Right-sided paralysis with facial droop. Last seen normal yesterday at 8 p.m. EXAM: MRI HEAD WITHOUT CONTRAST MRA HEAD WITHOUT CONTRAST TECHNIQUE: Multiplanar, multiecho pulse  sequences of the brain and surrounding structures were obtained without intravenous contrast. Angiographic images of the head were obtained using MRA technique without contrast. COMPARISON:  MRI brain 01/17/2013. FINDINGS: MRI HEAD FINDINGS There is a moderately large area of acute infarction, nonhemorrhagic, affecting the LEFT hemisphere within the LEFT anterior cerebral artery distribution. Both focal and confluent areas of acute infarction affect the medial and superior frontal lobe, parasagittal frontal lobe, and cingulate gyrus, regional white matter, as well as the genu of the corpus callosum on the LEFT. No MCA territory involvement. No right-sided involvement. No hemorrhage, mass lesion, hydrocephalus, or extra-axial fluid. Premature for age cerebral and cerebellar atrophy. Extensive focal and confluent white matter signal abnormality, predominantly supratentorial but also affecting the brainstem, consistent with chronic microvascular ischemic change. Numerous chronic lacunar infarcts affect the deep nuclei and periventricular white matter, with greatest involvement in the LEFT  basal ganglia. Flow voids are maintained in the carotid, basilar, and both vertebral arteries. Tiny foci of chronic hemorrhage can be seen in the cerebellum, brainstem, and RIGHT thalamus, likely sequelae of hypertensive cerebrovascular disease. Pituitary, pineal, and cerebellar tonsils unremarkable. No upper cervical lesions. Visualized calvarium, skull base, and upper cervical osseous structures unremarkable. Scalp and extracranial soft tissues, orbits, sinuses, and mastoids show no acute process. MRA HEAD FINDINGS The internal carotid arteries are widely patent. The basilar artery is widely patent with vertebrals codominant. No RIGHT or LEFT MCA disease. The RIGHT A1 ACA is mildly irregular without focal narrowing. The LEFT A1 ACA is moderately diseased in its distal A1 segment, estimated 50-75% stenosis. The LEFT A2 ACA is diffusely narrowed, and there is an acute occlusion of the LEFT anterior cerebral artery in its mid A2 segment just proximal to the origin of the frontopolar branch. Fetal origin LEFT PCA. Mildly diseased RIGHT PCA and severely narrowed LEFT PCA suggesting intracranial atherosclerotic disease. Moderately irregular BILATERAL MCA branches also consistent with intracranial atherosclerotic disease. No cerebellar branch occlusion.  No intracranial aneurysm. IMPRESSION: Acute nonhemorrhagic LEFT anterior cerebral artery territory infarction affecting the medial and anterior frontal lobe including the cingulate gyrus and regional white matter. 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 No large vessel occlusion is evident. Electronically Signed   By: Staci Righter M.D.   On: 07/02/2015 20:55   Mr Virgel Paling (cerebral Arteries)  07/02/2015  CLINICAL DATA:  Right-sided  paralysis with facial droop. Last seen normal yesterday at 8 p.m. EXAM: MRI HEAD WITHOUT CONTRAST MRA HEAD WITHOUT CONTRAST TECHNIQUE: Multiplanar, multiecho pulse sequences of the brain and surrounding structures were obtained without intravenous contrast. Angiographic images of the head were obtained using MRA technique without contrast. COMPARISON:  MRI brain 01/17/2013. FINDINGS: MRI HEAD FINDINGS There is a moderately large area of acute infarction, nonhemorrhagic, affecting the LEFT hemisphere within the LEFT anterior cerebral artery distribution. Both focal and confluent areas of acute infarction affect the medial and superior frontal lobe, parasagittal frontal lobe, and cingulate gyrus, regional white matter, as well as the genu of the corpus callosum on the LEFT. No MCA territory involvement. No right-sided involvement. No hemorrhage, mass lesion, hydrocephalus, or extra-axial fluid. Premature for age cerebral and cerebellar atrophy. Extensive focal and confluent white matter signal abnormality, predominantly supratentorial but also affecting the brainstem, consistent with chronic microvascular ischemic change. Numerous chronic lacunar infarcts affect the deep nuclei and periventricular white matter, with greatest  involvement in the LEFT basal ganglia. Flow voids are maintained in the carotid, basilar, and both vertebral arteries. Tiny foci of chronic hemorrhage can be seen in the cerebellum, brainstem, and RIGHT thalamus, likely sequelae of hypertensive cerebrovascular disease. Pituitary, pineal, and cerebellar tonsils unremarkable. No upper cervical lesions. Visualized calvarium, skull base, and upper cervical osseous structures unremarkable. Scalp and extracranial soft tissues, orbits, sinuses, and mastoids show no acute process. MRA HEAD FINDINGS The internal carotid arteries are widely patent. The basilar artery is widely patent with vertebrals codominant. No RIGHT or LEFT MCA disease. The RIGHT A1 ACA  is mildly irregular without focal narrowing. The LEFT A1 ACA is moderately diseased in its distal A1 segment, estimated 50-75% stenosis. The LEFT A2 ACA is diffusely narrowed, and there is an acute occlusion of the LEFT anterior cerebral artery in its mid A2 segment just proximal to the origin of the frontopolar branch. Fetal origin LEFT PCA. Mildly diseased RIGHT PCA and severely narrowed LEFT PCA suggesting intracranial atherosclerotic disease. Moderately irregular BILATERAL MCA branches also consistent with intracranial atherosclerotic disease. No cerebellar branch occlusion.  No intracranial aneurysm. IMPRESSION: Acute nonhemorrhagic LEFT anterior cerebral artery territory infarction affecting the medial and anterior frontal lobe including the cingulate gyrus and regional white matter. 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 No large vessel occlusion is evident. Electronically Signed   By: Staci Righter M.D.   On: 07/02/2015 20:55    CBC  Recent Labs Lab 07/02/15 1922 07/02/15 1925 07/03/15 0311 07/05/15 0513  WBC 13.4*  --  10.7* 7.7  HGB 15.0 17.0 14.0 14.2  HCT 44.5 50.0 42.2 42.1  PLT 273  --  276 282  MCV 96.1  --  94.4 95.2  MCH 32.4  --  31.3 32.1  MCHC 33.7  --  33.2 33.7  RDW 12.9  --  12.9 12.7  LYMPHSABS 1.0  --   --   --   MONOABS 0.6  --   --   --   EOSABS 0.0  --   --   --   BASOSABS 0.0  --   --   --     Chemistries   Recent Labs Lab 07/02/15 1922 07/02/15 1925 07/03/15 0311 07/05/15 0513  NA 137 139 137 140  K 4.0 3.9 3.9 3.6  CL 105 103 105 108  CO2 21*  --  22 24  GLUCOSE 110* 107* 115* 96  BUN 9 11 8 8   CREATININE 0.90 0.70 0.92 0.95  CALCIUM 9.5  --  9.1 9.0  AST 32  --  28  --   ALT 23  --  22  --   ALKPHOS 57  --  53  --    BILITOT 1.2  --  1.5*  --    ------------------------------------------------------------------------------------------------------------------ estimated creatinine clearance is 99.2 mL/min (by C-G formula based on Cr of 0.95). ------------------------------------------------------------------------------------------------------------------  Recent Labs  07/03/15 0312  HGBA1C 5.3   ------------------------------------------------------------------------------------------------------------------  Recent Labs  07/03/15 0311  CHOL 172  HDL 72  LDLCALC 91  TRIG 44  CHOLHDL 2.4   ------------------------------------------------------------------------------------------------------------------ No results for input(s): TSH, T4TOTAL, T3FREE, THYROIDAB in the last 72 hours.  Invalid input(s): FREET3 ------------------------------------------------------------------------------------------------------------------ No results for input(s): VITAMINB12, FOLATE, FERRITIN, TIBC, IRON, RETICCTPCT in the last 72 hours.  Coagulation profile  Recent Labs Lab 07/02/15 1922  INR 1.17  No results for input(s): DDIMER in the last 72 hours.  Cardiac Enzymes No results for input(s): CKMB, TROPONINI, MYOGLOBIN in the last 168 hours.  Invalid input(s): CK ------------------------------------------------------------------------------------------------------------------ Invalid input(s): [place]  07/04/15 0752 07/04/15 1127 07/04/15 1659 07/04/15 2011 07/05/15 0759 07/05/15 1144  GLUCAP 91 91 138* 104* 95 97     Gerrica Cygan M.D. [facility] 07/05/2015, 12:52 PM  Pager: [phone] Between 7am to 7pm - call Pager - [phone]  After 7pm go to [email or web address] - password [email or web address]  Call night coverage person covering after 7pm
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
## 2015-07-06 LAB — BASIC METABOLIC PANEL
Anion gap: 6 (ref 5–15)
BUN: 8 mg/dL (ref 6–20)
CO2: 25 mmol/L (ref 22–32)
Calcium: 9.1 mg/dL (ref 8.9–10.3)
Chloride: 112 mmol/L — ABNORMAL HIGH (ref 101–111)
Creatinine, Ser: 1.03 mg/dL (ref 0.61–1.24)
GFR calc Af Amer: >60 mL/min (ref >60)
GFR calc non Af Amer: >60 mL/min (ref >60)
Glucose, Bld: 137 mg/dL — ABNORMAL HIGH (ref 65–99)
Potassium: 3.5 mmol/L (ref 3.5–5.1)
Sodium: 143 mmol/L (ref 135–145)

## 2015-07-06 LAB — GLUCOSE, CAPILLARY
Glucose-Capillary: 134 mg/dL — ABNORMAL HIGH (ref 65–99)
Glucose-Capillary: 106 mg/dL — ABNORMAL HIGH (ref 65–99)
Glucose-Capillary: 112 mg/dL — ABNORMAL HIGH (ref 65–99)

## 2015-07-06 NOTE — Progress Notes (Signed)
Pt BP 175/96, asymptomatic. MD notified. PRN hydralazine given. Will continue to monitor patient.

## 2015-07-06 NOTE — Evaluation (Signed)
Occupational Therapy Evaluation Patient Details Name: Gerald [name] MRN: [id number] DOB: June 13, 1961 Today's Date: 07/06/2015    History of Present Illness Pt adm with rt hemiplegia. MRI positive for Lt CVA. PMH - HTN, CVA's   Clinical Impression   This 55 yo male admitted with above presents to acute OT with deficits below affecting his PLOF of independent with basic and IADLs. He will benefit from acute OT with follow up OT on CIR to get to a S-Mod I level.     Follow Up Recommendations  CIR    Equipment Recommendations   (TBD next venue)       Precautions / Restrictions Precautions Precautions: Fall Restrictions Weight Bearing Restrictions: No      Mobility Bed Mobility Overal bed mobility: Needs Assistance Bed Mobility: Rolling;Sidelying to Sit;Sit to Sidelying Rolling: Mod assist (to left) Sidelying to sit: Max assist (due to pt pushing posteriorly coming up to sit from left side)   Sit to supine: Mod assist   General bed mobility comments: VCs for sequencing     Balance Overall balance assessment: Needs assistance Sitting-balance support: Feet supported;Single extremity supported Sitting balance-Leahy Scale: Poor Sitting balance - Comments: Pt sat EOB 2O minutes workign on midline sitting, reaching out of midline and coming back to midline--pts tendency is for right lean. He could readjust himself by reaching for upper bed rail with LUE Postural control: Right lateral lean Standing balance support: Single extremity supported Standing balance-Leahy Scale: Zero Standing balance comment: Upon initial standing pt pushing/leaning to right; needed VCs to reach further left for rail and pull self to left (stood x2)                            ADL Overall ADL's : Needs assistance/impaired Eating/Feeding: Set up;Bed level   Grooming: Moderate assistance;Bed level   Upper Body Bathing: Moderate assistance;Bed level   Lower Body Bathing: Maximal  assistance;Bed level   Upper Body Dressing : Total assistance;Bed level   Lower Body Dressing: Total assistance;Bed level                       Vision Additional Comments: Needs to further tested/assesed          Pertinent Vitals/Pain Pain Assessment: No/denies pain Faces Pain Scale: No hurt     Hand Dominance Right   Extremity/Trunk Assessment Upper Extremity Assessment Upper Extremity Assessment: RUE deficits/detail RUE Deficits / Details: Brunstrum 0, noted increased tone at elbow (encouraged his wife to help him straighten it back out as she notices it drawing up); currently can get full ellbow extension with minimal effort; pt does report he feels nailbed pressure on his 2nd digit, he said no to his 5th digit but there was an automatic withdrawl but delayed when nail bed pressure was applied to 5th digit. Demonstrated to pt's wife how to prop his RUE on pillow   Lower Extremity Assessment RLE Deficits / Details: Noted withdrawl to nail bed pressure (quicker on first digit, slower on 5th digit). Educated pt's wife on how to position pillows against bed rail at pt's knee on pt's right to help position pt's RLE in neutral alignment for hip          Cognition Arousal/Alertness: Awake/alert Behavior During Therapy: Flat affect Overall Cognitive Status: Impaired/Different from baseline Area of Impairment: Following commands;Safety/judgement;Problem solving       Following Commands: Follows one step commands inconsistently;Follows one step commands  with increased time (increased cues needed at times) Safety/Judgement: Decreased awareness of safety;Decreased awareness of deficits   Problem Solving: Slow processing;Decreased initiation;Difficulty sequencing;Requires verbal cues;Requires tactile cues (requires gestural cues)                Home Living Family/patient expects to be discharged to:: Inpatient rehab Living Arrangements: Spouse/significant  other Available Help at Discharge: Family;Available PRN/intermittently Type of Home: Apartment Home Access: Stairs to enter Entrance Stairs-Number of Steps: 3 Entrance Stairs-Rails: Left Home Layout: One level     Bathroom Shower/Tub: Teacher, early years/pre: Standard     Home Equipment: None   Additional Comments: Information from prior encounter  Lives With: Significant other    Prior Functioning/Environment Level of Independence: Independent             OT Diagnosis: Generalized weakness   OT Problem List: Decreased strength;Decreased range of motion;Decreased activity tolerance;Impaired balance (sitting and/or standing);Impaired sensation;Decreased safety awareness;Impaired UE functional use;Decreased coordination;Impaired tone   OT Treatment/Interventions: Self-care/ADL training;Patient/family education;Balance training;Visual/perceptual remediation/compensation;Therapeutic activities;DME and/or AE instruction;Neuromuscular education;Therapeutic exercise    OT Goals(Current goals can be found in the care plan section) Acute Rehab OT Goals Patient Stated Goal: wife--pt to go to inpatient rehab OT Goal Formulation: With patient/family Time For Goal Achievement: 07/13/15 Potential to Achieve Goals: Good  OT Frequency: Min 3X/week              End of Session    Activity Tolerance: Patient tolerated treatment well Patient left: in bed;with call bell/phone within reach;with bed alarm set   Time: 1400-1437 OT Time Calculation (min): 37 min Charges:  OT General Charges $OT Visit: 1 Procedure OT Evaluation $OT Eval Moderate Complexity: 1 Procedure OT Treatments $Therapeutic Activity: 8-22 mins  Almon Register [id number] 07/06/2015, 3:58 PM

## 2015-07-06 NOTE — NC FL2 (Signed)
[place] LEVEL OF CARE SCREENING TOOL     IDENTIFICATION  Patient Name: Gerald [name] Birthdate: [birth year] Sex: male Admission Date (Current Location): 07/02/2015  [facility] and [place] Number:  Herbalist and Address:  [facility], [place] [address] [place], [place] [address]      Provider Number: [id number]  Attending Physician Name and Address:  Mendel Corning, MD  Relative Name and Phone Number:       Current Level of Care: Hospital Recommended Level of Care: [place] Prior Approval Number:    Date Approved/Denied:   PASRR Number:    Discharge Plan: SNF    Current Diagnoses: Patient Active Problem List   Diagnosis Date Noted  . Cerebrovascular accident (CVA) ([place])   . Stroke ([place]) 07/02/2015  . Acute ischemic stroke ([place]) 07/02/2015  . CVA (cerebral infarction) 01/15/2013  . HTN (hypertension) 01/15/2013  . Nicotine dependence 01/15/2013  . Alcohol abuse 01/15/2013    Orientation RESPIRATION BLADDER Height & Weight    Self, Time, Situation, Place  Normal Continent 6' (182.9 cm) 169 lbs.  BEHAVIORAL SYMPTOMS/MOOD NEUROLOGICAL BOWEL NUTRITION STATUS      Continent Diet (heart healthy)  AMBULATORY STATUS COMMUNICATION OF NEEDS Skin   Limited Assist Verbally Normal                       Personal Care Assistance Level of Assistance  Bathing, Dressing Bathing Assistance: Limited assistance   Dressing Assistance: Limited assistance     Functional Limitations Info             SPECIAL CARE FACTORS FREQUENCY  PT (By licensed PT), OT (By licensed OT)     PT Frequency:  (5x/week) OT Frequency:  (5x/week)            Contractures Contractures Info: Not present    Additional Factors Info                  Current Medications (07/06/2015):  This is the current hospital active medication list Current Facility-Administered Medications  Medication Dose Route Frequency Provider  Last Rate Last Dose  . amLODipine (NORVASC) tablet 5 mg  5 mg Oral Daily Ripudeep K Rai, MD   5 mg at 07/06/15 1004  . antiseptic oral rinse (CPC / CETYLPYRIDINIUM CHLORIDE 0.05%) solution 7 mL  7 mL Mouth Rinse q12n4p Ripudeep K Rai, MD   7 mL at 07/06/15 1557  . aspirin suppository 300 mg  300 mg Rectal Daily Edwin Dada, MD   300 mg at 07/04/15 1031   Or  . aspirin tablet 325 mg  325 mg Oral Daily Edwin Dada, MD   325 mg at 07/06/15 1005  . atorvastatin (LIPITOR) tablet 20 mg  20 mg Oral q1800 Ripudeep K Rai, MD   20 mg at 07/05/15 1719  . chlorhexidine (PERIDEX) 0.12 % solution 15 mL  15 mL Mouth Rinse BID Ripudeep K Rai, MD   15 mL at 07/06/15 1005  . enoxaparin (LOVENOX) injection 40 mg  40 mg Subcutaneous Q24H Donzetta Starch, NP   40 mg at 07/05/15 1719  . folic acid (FOLVITE) tablet 1 mg  1 mg Oral Daily Edwin Dada, MD   1 mg at 07/06/15 1005  . hydrALAZINE (APRESOLINE) injection 10 mg  10 mg Intravenous Q6H PRN Ripudeep Krystal Eaton, MD   10 mg at 07/04/15 1530  . lisinopril (PRINIVIL,ZESTRIL) tablet 20 mg  20 mg Oral Daily Ripudeep K Rai, MD   20 mg at 07/06/15 1005  . multivitamin with minerals tablet 1 tablet  1 tablet Oral Daily Edwin Dada, MD   1 tablet at 07/06/15 1005  . senna-docusate (Senokot-S) tablet 1 tablet  1 tablet Oral QHS PRN Edwin Dada, MD      . thiamine (VITAMIN B-1) tablet 100 mg  100 mg Oral Daily Edwin Dada, MD   100 mg at 07/06/15 1005     Discharge Medications: Please see discharge summary for a list of discharge medications.  Relevant Imaging Results:  Relevant Lab Results:   Additional Information    Jeannine Pennisi M, LCSW

## 2015-07-06 NOTE — Progress Notes (Signed)
[facility]                                                                              Patient Demographics  Gerald [name], is a 55 y.o. male, DOB - 1960/08/05, [id number]  Admit date - 07/02/2015   Admitting Physician Edwin Dada, MD  Outpatient Primary MD for the patient is No PCP Per Patient  LOS - 4   Chief Complaint  Patient presents with  . Stroke Symptoms       Brief HPI   Gerald [name] is a 55 y.o. male with a past medical history significant for HTN, smoking, and CVA in 2014 who presented with right sided weakness. Patient was in his normal state of health until he went to the bed the night before the admission, woke up at 4 AM feeling right-sided weakness. In ED, he was hypertensive greater than 200/120, coags were normal, troponin was normal, renal function was normal, and a CT head showed left-sided changes. A follow-up MRI MRA showed acute nonhemorrhagic CVA in the left ACA distribution affecting medial, superior frontal lobe, parasagittal frontal lobe, and cingulate gyrus. Neurology evaluated the patient.Patient was admitted for further workup   Assessment & Plan    Principal Problem:  Acute CVA- presenting with right-sided hemiparesis, slurred speech - Delayed presentation hence not a TPA candidate. Neurology was consulted. - MRI brain showed acute nonhemorrhagic left anterior cerebral artery territory infarction affecting the medial and anterior frontal lobe including the single regardless, additional white matter. Advanced chronic microvascular ischemic changes, numerous lacunar infarcts, chronic micro-bleeds representing secretly of hypertensive cerebrovascular disease. - MRA showed diseased/occluded left A2 ACA - 2-D echo results EF 0000000, grade 1 diastolic dysfunction - Carotid Dopplers 1-39% ICA stenosis  - Neurology recommended to continue aspirin 325 mg daily. I discussed with neurology, recommended to continue aspirin  325 mg daily if patient was noncompliant with aspirin prior to admission. I discussed with patient's wife who confirmed that patient was not  compliant with aspirin prior to admission. Hence we will continue aspirin 325 mg daily. - Patient passed swallow testing, started on heart healthy diet. - Lipid panel showed LDL 91 , goal less than 70, placed on statin - Hemoglobin A1c 5.3 - PT evaluation -> inpatient rehabilitation  Active Problems:   HTN (hypertension) emergency/malignant hypertension - BP 205/123 on admission - Added lisinopril 20 mg daily and Norvasc 5 mg daily - Continue hydralazine  as needed with parameters  Hyperlipidemia LDL 91, goal less than 70, placed on statin once patient is able to swallow    Nicotine dependence - Counseled strongly on smoking cessation  Cocaine abuse - UDS positive on cocaine, counseled strongly on cocaine cessation    Alcohol abuse - Place on CIWA protocol   Code Status: Full code  Family Communication: Discussed in detail with the patient, all imaging results, lab results explained to the patient and wife at the bedside  Disposition Plan: CIR when bed available, hopefully tomorrow   Time Spent in minutes 25 minutes  Procedures  MRI  MRA  2-D echo, carotid Dopplers  Consults   Neurology  DVT Prophylaxis  Lovenox   Medications  Scheduled Meds: . amLODipine  5 mg Oral Daily  . antiseptic oral rinse  7 mL Mouth Rinse q12n4p  . aspirin  300 mg Rectal Daily   Or  . aspirin  325 mg Oral Daily  . atorvastatin  20 mg Oral q1800  . chlorhexidine  15 mL Mouth Rinse BID  . enoxaparin (LOVENOX) injection  40 mg Subcutaneous Q24H  . folic acid  1 mg Oral Daily  . lisinopril  20 mg Oral Daily  . multivitamin with minerals  1 tablet Oral Daily  . thiamine  100 mg Oral Daily   Continuous Infusions:   PRN Meds:.hydrALAZINE, senna-docusate   Antibiotics   Anti-infectives    None        Subjective:   Gerald [name] was  seen and examined today. Persisting Right-sided weakness, no significant improvement, no further new deficits. Patient denies any chest pain, shortness of breath, abdominal pain, N/V/D/C.  Objective:   Blood pressure 145/98, pulse 66, temperature 98.3 F (36.8 C), temperature source Oral, resp. rate 18, height 6' (1.829 m), weight 77.066 kg (169 lb 14.4 oz), SpO2 95 %.  Wt Readings from Last 3 Encounters:  07/03/15 77.066 kg (169 lb 14.4 oz)  01/16/13 77.021 kg (169 lb 12.8 oz)     Intake/Output Summary (Last 24 hours) at 07/06/15 1254 Last data filed at 07/06/15 1100  Gross per 24 hour  Intake    560 ml  Output    950 ml  Net   -390 ml    Exam  General: Alert and oriented x 3, NAD, slurred speech  HEENT:  PERRLA, EOMI  Neck: Supple, no JVD, no masses  CVS: S1 S2 clear, RRR  Respiratory: CTAB  Abdomen: Soft, NT, ND, NBS  Ext: no cyanosis clubbing or edema  Neuro: AAOx3, strength 5/5 left upper and lower extremity, 0/5 RUE, RLE  Skin: No rashes  Psych: Normal affect and demeanor, alert and oriented x3    Data Review   Micro Results No results found for this or any previous visit (from the past 240 hour(s)).  Radiology Reports Ct Head Wo Contrast  07/02/2015  CLINICAL DATA:  Right-sided paralysis and facial droop. Initial symptoms at 4 a.m. this morning. EXAM: CT HEAD WITHOUT CONTRAST TECHNIQUE: Contiguous axial images were obtained from the base of the skull through the vertex without intravenous contrast. COMPARISON:  Brain MRI from earlier same day. Comparison also to head CT dated 01/15/2013. FINDINGS: There is ill-defined areas of edema within the medial and anterior aspects of the left frontal lobe, new compared to the earlier head CT, compatible with the acute infarction seen on the recent brain MRI. Edema extends inferiorly to the level of left periventricular white matter and the genu of the corpus callosum. There is associated mild sulcal effacement. No  midline shift or herniation. No parenchymal hemorrhage. No extra-axial hemorrhage. Additional mild chronic small vessel ischemic change noted within the right periventricular white matter. Osseous structures are unremarkable. Visualized upper paranasal sinuses are clear. Mastoid air cells are clear. Superficial soft tissues are unremarkable. IMPRESSION: 1. Ill-defined edema within the medial and anterior aspects of the left frontal lobe, extending inferiorly to the level of the left periventricular white matter and genu of the corpus callosum, compatible with the acute ACA distribution infarction identified on brain MRI performed just before this head CT. 2. Associated mild sulcal effacement. No midline shift or herniation. 3. No intracranial hemorrhage. Electronically Signed   By: Cherlynn Kaiser  Enriqueta Shutter M.D.   On: 07/02/2015 21:05   Mr Brain Wo Contrast (neuro Protocol)  07/02/2015  CLINICAL DATA:  Right-sided paralysis with facial droop. Last seen normal yesterday at 8 p.m. EXAM: MRI HEAD WITHOUT CONTRAST MRA HEAD WITHOUT CONTRAST TECHNIQUE: Multiplanar, multiecho pulse sequences of the brain and surrounding structures were obtained without intravenous contrast. Angiographic images of the head were obtained using MRA technique without contrast. COMPARISON:  MRI brain 01/17/2013. FINDINGS: MRI HEAD FINDINGS There is a moderately large area of acute infarction, nonhemorrhagic, affecting the LEFT hemisphere within the LEFT anterior cerebral artery distribution. Both focal and confluent areas of acute infarction affect the medial and superior frontal lobe, parasagittal frontal lobe, and cingulate gyrus, regional white matter, as well as the genu of the corpus callosum on the LEFT. No MCA territory involvement. No right-sided involvement. No hemorrhage, mass lesion, hydrocephalus, or extra-axial fluid. Premature for age cerebral and cerebellar atrophy. Extensive focal and confluent white matter signal abnormality,  predominantly supratentorial but also affecting the brainstem, consistent with chronic microvascular ischemic change. Numerous chronic lacunar infarcts affect the deep nuclei and periventricular white matter, with greatest involvement in the LEFT basal ganglia. Flow voids are maintained in the carotid, basilar, and both vertebral arteries. Tiny foci of chronic hemorrhage can be seen in the cerebellum, brainstem, and RIGHT thalamus, likely sequelae of hypertensive cerebrovascular disease. Pituitary, pineal, and cerebellar tonsils unremarkable. No upper cervical lesions. Visualized calvarium, skull base, and upper cervical osseous structures unremarkable. Scalp and extracranial soft tissues, orbits, sinuses, and mastoids show no acute process. MRA HEAD FINDINGS The internal carotid arteries are widely patent. The basilar artery is widely patent with vertebrals codominant. No RIGHT or LEFT MCA disease. The RIGHT A1 ACA is mildly irregular without focal narrowing. The LEFT A1 ACA is moderately diseased in its distal A1 segment, estimated 50-75% stenosis. The LEFT A2 ACA is diffusely narrowed, and there is an acute occlusion of the LEFT anterior cerebral artery in its mid A2 segment just proximal to the origin of the frontopolar branch. Fetal origin LEFT PCA. Mildly diseased RIGHT PCA and severely narrowed LEFT PCA suggesting intracranial atherosclerotic disease. Moderately irregular BILATERAL MCA branches also consistent with intracranial atherosclerotic disease. No cerebellar branch occlusion.  No intracranial aneurysm. IMPRESSION: Acute nonhemorrhagic LEFT anterior cerebral artery territory infarction affecting the medial and anterior frontal lobe including the cingulate gyrus and regional white matter. 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 Intracranial  atherosclerotic disease with diseased/occluded LEFT A2 ACA. No large vessel occlusion is evident. Electronically Signed   By: Staci Righter M.D.   On: 07/02/2015 20:55   Mr Gerald [name] (cerebral Arteries)  07/02/2015  CLINICAL DATA:  Right-sided paralysis with facial droop. Last seen normal yesterday at 8 p.m. EXAM: MRI HEAD WITHOUT CONTRAST MRA HEAD WITHOUT CONTRAST TECHNIQUE: Multiplanar, multiecho pulse sequences of the brain and surrounding structures were obtained without intravenous contrast. Angiographic images of the head were obtained using MRA technique without contrast. COMPARISON:  MRI brain 01/17/2013. FINDINGS: MRI HEAD FINDINGS There is a moderately large area of acute infarction, nonhemorrhagic, affecting the LEFT hemisphere within the LEFT anterior cerebral artery distribution. Both focal and confluent areas of acute infarction affect the medial and superior frontal lobe, parasagittal frontal lobe, and cingulate gyrus, regional white matter, as well as the genu of the corpus callosum on the LEFT. No MCA territory involvement. No right-sided involvement. No hemorrhage, mass lesion,  hydrocephalus, or extra-axial fluid. Premature for age cerebral and cerebellar atrophy. Extensive focal and confluent white matter signal abnormality, predominantly supratentorial but also affecting the brainstem, consistent with chronic microvascular ischemic change. Numerous chronic lacunar infarcts affect the deep nuclei and periventricular white matter, with greatest involvement in the LEFT basal ganglia. Flow voids are maintained in the carotid, basilar, and both vertebral arteries. Tiny foci of chronic hemorrhage can be seen in the cerebellum, brainstem, and RIGHT thalamus, likely sequelae of hypertensive cerebrovascular disease. Pituitary, pineal, and cerebellar tonsils unremarkable. No upper cervical lesions. Visualized calvarium, skull base, and upper cervical osseous structures unremarkable. Scalp and  extracranial soft tissues, orbits, sinuses, and mastoids show no acute process. MRA HEAD FINDINGS The internal carotid arteries are widely patent. The basilar artery is widely patent with vertebrals codominant. No RIGHT or LEFT MCA disease. The RIGHT A1 ACA is mildly irregular without focal narrowing. The LEFT A1 ACA is moderately diseased in its distal A1 segment, estimated 50-75% stenosis. The LEFT A2 ACA is diffusely narrowed, and there is an acute occlusion of the LEFT anterior cerebral artery in its mid A2 segment just proximal to the origin of the frontopolar branch. Fetal origin LEFT PCA. Mildly diseased RIGHT PCA and severely narrowed LEFT PCA suggesting intracranial atherosclerotic disease. Moderately irregular BILATERAL MCA branches also consistent with intracranial atherosclerotic disease. No cerebellar branch occlusion.  No intracranial aneurysm. IMPRESSION: Acute nonhemorrhagic LEFT anterior cerebral artery territory infarction affecting the medial and anterior frontal lobe including the cingulate gyrus and regional white matter. 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 No large vessel occlusion is evident. Electronically Signed   By: Staci Righter M.D.   On: 07/02/2015 20:55    CBC  Recent Labs Lab 07/02/15 1922 07/02/15 1925 07/03/15 0311 07/05/15 0513  WBC 13.4*  --  10.7* 7.7  HGB 15.0 17.0 14.0 14.2  HCT 44.5 50.0 42.2 42.1  PLT 273  --  276 282  MCV 96.1  --  94.4 95.2  MCH 32.4  --  31.3 32.1  MCHC 33.7  --  33.2 33.7  RDW 12.9  --  12.9 12.7  LYMPHSABS 1.0  --   --   --   MONOABS 0.6  --   --   --   EOSABS 0.0  --   --   --   BASOSABS 0.0  --   --   --     Chemistries   Recent Labs Lab 07/02/15 1922 07/02/15 1925 07/03/15 0311  07/05/15 0513 07/06/15 0800  NA 137 139 137 140 143  K 4.0 3.9 3.9 3.6 3.5  CL 105 103 105 108 112*  CO2 21*  --  22 24 25   GLUCOSE 110* 107* 115* 96 137*  BUN 9 11 8 8 8   CREATININE 0.90 0.70 0.92 0.95 1.03  CALCIUM 9.5  --  9.1 9.0 9.1  AST 32  --  28  --   --   ALT 23  --  22  --   --   ALKPHOS 57  --  53  --   --   BILITOT 1.2  --  1.5*  --   --    ------------------------------------------------------------------------------------------------------------------ estimated creatinine clearance is 91.5 mL/min (by C-G formula based on Cr of 1.03). ------------------------------------------------------------------------------------------------------------------ No results for input(s): HGBA1C in the last 72 hours. ------------------------------------------------------------------------------------------------------------------ No results  for input(s): CHOL, HDL, LDLCALC, TRIG, CHOLHDL, LDLDIRECT in the last 72 hours. ------------------------------------------------------------------------------------------------------------------ No results for input(s): TSH, T4TOTAL, T3FREE, THYROIDAB in the last 72 hours.  Invalid input(s): FREET3 ------------------------------------------------------------------------------------------------------------------ No results for input(s): VITAMINB12, FOLATE, FERRITIN, TIBC, IRON, RETICCTPCT in the last 72 hours.  Coagulation profile  Recent Labs Lab 07/02/15 1922  INR 1.17    No results for input(s): DDIMER in the last 72 hours.  Cardiac Enzymes No results for input(s): CKMB, TROPONINI, MYOGLOBIN in the last 168 hours.  Invalid input(s): CK ------------------------------------------------------------------------------------------------------------------ Invalid input(s): [place]  07/05/15 0759 07/05/15 1144 07/05/15 1634 07/05/15 2140 07/06/15 0750 07/06/15 1204  GLUCAP 95 97 88 105* 134* 106*     Jessia Kief  M.D. [facility] 07/06/2015, 12:54 PM  Pager: [phone] Between 7am to 7pm - call Pager - [phone]  After 7pm go to [email or web address] - password [email or web address]  Call night coverage person covering after 7pm

## 2015-07-06 NOTE — Clinical Social Work Note (Signed)
Clinical Social Work Assessment  Patient Details  Name: Gerald [name] MRN: [id number] Date of Birth: 1961/04/20  Date of referral:  07/06/15               Reason for consult:  Facility Placement                Permission sought to share information with:  Chartered certified accountant granted to share information::  Yes, Verbal Permission Granted  Name::        Agency::     Relationship::     Contact Information:     Housing/Transportation Living arrangements for the past 2 months:  Single Family Home Source of Information:  Patient, Partner, Other (Comment Required) (sister) Patient Interpreter Needed:  None Criminal Activity/Legal Involvement Pertinent to Current Situation/Hospitalization:  No - Comment as needed Significant Relationships:  Significant Other, Siblings Lives with:    Do you feel safe going back to the place where you live?  No Need for family participation in patient care:  No (Coment)  Care giving concerns: Pt will not have 24 hour care at d/c from CIR and want to explore SNF options in the community. Social Worker assessment / plan:  CSW spoke with pt/family who are agreeable to SNF search in [place] learned after talking with family that pt is self-pay, so his options for SNF may be limited. CSW will begin SNF LOG search and Unit CSW will further explore pt's disposition options, as appropriate.  Employment status:  Systems developer information:  Self Pay (Medicaid Pending) PT Recommendations:  [place] / Referral to community resources:  [place]  Patient/Family's Response to care: Agreeable to SNF search/placement.  Pt confident that his sister and girlfriend will choose the appropriate place for him.  Patient/Family's Understanding of and Emotional Response to Diagnosis, Current Treatment, and Prognosis: Pt understands that he needs continued rehab in order to increase his independence  and return home. Emotional Assessment Appearance:  Appears older than stated age Attitude/Demeanor/Rapport:   (appropriate) Affect (typically observed):  Accepting Orientation:  Oriented to Self, Oriented to Place, Oriented to  Time, Oriented to Situation Alcohol / Substance use:  Tobacco Use, Alcohol Use, Illicit Drugs Psych involvement (Current and /or in the community):  No (Comment)  Discharge Needs  Concerns to be addressed:  Discharge Planning Concerns Readmission within the last 30 days:  No Current discharge risk:  None, Lack of support system, Physical Impairment, Inadequate Financial Supports, Dependent with Mobility, Substance Abuse Barriers to Discharge:  Inadequate or no insurance   Roanna Raider, LCSW 07/06/2015, 4:28 PM

## 2015-07-06 NOTE — Clinical Social Work Placement (Signed)
   CLINICAL SOCIAL WORK PLACEMENT  NOTE  Date:  07/06/2015  Patient Details  Name: BASTIAN BALSBAUGH MRN: [id number] Date of Birth: [birth year]  Clinical Social Work is seeking post-discharge placement for this patient at the [place] level of care (*CSW will initial, date and re-position this form in  chart as items are completed):  No   Patient/family provided with [place] Work Department's list of facilities offering this level of care within the geographic area requested by the patient (or if unable, by the patient's family).  Yes   Patient/family informed of their freedom to choose among providers that offer the needed level of care, that participate in Medicare, Medicaid or managed care program needed by the patient, have an available bed and are willing to accept the patient.  No   Patient/family informed of [facility]'s ownership interest in [facility] and [facility], as well as of the fact that they are under no obligation to receive care at these facilities.  PASRR submitted to EDS on       PASRR number received on       Existing PASRR number confirmed on       FL2 transmitted to all facilities in geographic area requested by pt/family on       FL2 transmitted to all facilities within larger geographic area on       Patient informed that his/her managed care company has contracts with or will negotiate with certain facilities, including the following:            Patient/family informed of bed offers received.  Patient chooses bed at       Physician recommends and patient chooses bed at      Patient to be transferred to   on  .  Patient to be transferred to facility by       Patient family notified on   of transfer.  Name of family member notified:        PHYSICIAN       Additional Comment:    _______________________________________________ Roanna Raider, LCSW 07/06/2015, 4:37 PM

## 2015-07-06 NOTE — Progress Notes (Signed)
Dr. Leonie Man signed off on this patient on 07/04/2015. At that time the patient was still NPO and not on antiplatelet therapy. Discussed with Dr. Tana Coast and Dr. Jaynee Eagles. Apparently the patient was not compliant with his aspirin prior to admission to; therefore, he has not actually failed aspirin therapy. Would recommend continuing aspirin 325 mg daily and reinforcing the importance of abstinence from cocaine.  Mikey Bussing PA-C [facility] Pager [phone] 07/06/2015, 1:26 PM

## 2015-07-06 NOTE — Care Management Note (Signed)
Case Management Note  Patient Details  Name: Gerald [name] MRN: [id number] Date of Birth: Jul 08, 1960  Subjective/Objective:                 Admitted with rt hemiplegia. MRI positive for Lt CVA. PMH - HTN, CVA's. Independent with AD's pta. No DME usage pta.   Action/Plan: CIR  Expected Discharge Date:                  Expected Discharge Plan:  IP Rehab Facility  In-House Referral:     Discharge planning Services  CM Consult  Post Acute Care Choice:    Choice offered to:     DME Arranged:    DME Agency:     HH Arranged:    HH Agency:     Status of Service:  In process, will continue to follow  Medicare Important Message Given:    Date Medicare IM Given:    Medicare IM give by:    Date Additional Medicare IM Given:    Additional Medicare Important Message give by:     If discussed at [place] of Stay Meetings, dates discussed:    Additional Comments:  Audie Pinto [facility])  [phone]  [address] [place], [place] [phone] 07/06/2015, 9:13 AM

## 2015-07-07 DIAGNOSIS — G8190 Hemiplegia, unspecified affecting unspecified side: Secondary | ICD-10-CM

## 2015-07-07 DIAGNOSIS — I6930 Unspecified sequelae of cerebral infarction: Secondary | ICD-10-CM

## 2015-07-07 DIAGNOSIS — I10 Essential (primary) hypertension: Secondary | ICD-10-CM

## 2015-07-07 DIAGNOSIS — R471 Dysarthria and anarthria: Secondary | ICD-10-CM

## 2015-07-07 DIAGNOSIS — F17219 Nicotine dependence, cigarettes, with unspecified nicotine-induced disorders: Secondary | ICD-10-CM

## 2015-07-07 DIAGNOSIS — I639 Cerebral infarction, unspecified: Secondary | ICD-10-CM

## 2015-07-07 DIAGNOSIS — F1410 Cocaine abuse, uncomplicated: Secondary | ICD-10-CM

## 2015-07-07 LAB — BASIC METABOLIC PANEL
Anion gap: 9 (ref 5–15)
BUN: 7 mg/dL (ref 6–20)
CO2: 24 mmol/L (ref 22–32)
Calcium: 9.5 mg/dL (ref 8.9–10.3)
Chloride: 108 mmol/L (ref 101–111)
Creatinine, Ser: 0.92 mg/dL (ref 0.61–1.24)
GFR calc Af Amer: >60 mL/min (ref >60)
GFR calc non Af Amer: >60 mL/min (ref >60)
Glucose, Bld: 132 mg/dL — ABNORMAL HIGH (ref 65–99)
Potassium: 3.6 mmol/L (ref 3.5–5.1)
Sodium: 141 mmol/L (ref 135–145)

## 2015-07-07 LAB — GLUCOSE, CAPILLARY
Glucose-Capillary: 104 mg/dL — ABNORMAL HIGH (ref 65–99)
Glucose-Capillary: 109 mg/dL — ABNORMAL HIGH (ref 65–99)
Glucose-Capillary: 108 mg/dL — ABNORMAL HIGH (ref 65–99)

## 2015-07-07 MED ORDER — AMLODIPINE BESYLATE 10 MG PO TABS
10.0000 mg | ORAL_TABLET | Freq: Every day | ORAL | Status: DC
  Administered 2015-07-08: 10 mg via ORAL
  Filled 2015-07-07: qty 1

## 2015-07-07 MED ORDER — LISINOPRIL 40 MG PO TABS
40.0000 mg | ORAL_TABLET | Freq: Every day | ORAL | Status: DC
  Administered 2015-07-08: 40 mg via ORAL
  Filled 2015-07-07: qty 1
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
## 2015-07-07 NOTE — Progress Notes (Signed)
I met with pt and his sister, Gerald [name], at bedside. She states the plan is for pt to d/c home with a sister, Gerald [name], and that 24/7 assistance has been arranged for pt at d/c. Pt states he is in agreement to this plan. I do not have a bed available to admit this pt today, but do have a bed tomorrow. Pt and sister state they prefer inpt rehab rather than SNF if it is available. I will contact sister Gerald [name] to discuss. I have updated RN CM and SW. [phone]

## 2015-07-07 NOTE — Progress Notes (Signed)
[facility]                                                                              Patient Demographics  Gerald [name], is a 55 y.o. male, DOB - 1960-12-31, [id number]  Admit date - 07/02/2015   Admitting Physician Edwin Dada, MD  Outpatient Primary MD for the patient is No PCP Per Patient  LOS - 5   Chief Complaint  Patient presents with  . Stroke Symptoms       Brief HPI   Gerald [name] is a 55 y.o. male with a past medical history significant for HTN, smoking, and CVA in 2014 who presented with right sided weakness. Patient was in his normal state of health until he went to the bed the night before the admission, woke up at 4 AM feeling right-sided weakness. In ED, he was hypertensive greater than 200/120, coags were normal, troponin was normal, renal function was normal, and a CT head showed left-sided changes. A follow-up MRI MRA showed acute nonhemorrhagic CVA in the left ACA distribution affecting medial, superior frontal lobe, parasagittal frontal lobe, and cingulate gyrus. Neurology evaluated the patient.Patient was admitted for further workup   Assessment & Plan    Principal Problem:  Acute CVA- presenting with right-sided hemiparesis, slurred speech - Delayed presentation hence not a TPA candidate. Neurology was consulted. - MRI brain showed acute nonhemorrhagic left anterior cerebral artery territory infarction affecting the medial and anterior frontal lobe including the single regardless, additional white matter. Advanced chronic microvascular ischemic changes, numerous lacunar infarcts, chronic micro-bleeds representing secretly of hypertensive cerebrovascular disease. - MRA showed diseased/occluded left A2 ACA - 2-D echo results EF 0000000, grade 1 diastolic dysfunction - Carotid Dopplers 1-39% ICA stenosis  - Neurology recommended to continue aspirin 325 mg daily. I discussed with neurology, recommended to continue aspirin  325 mg daily if patient was noncompliant with aspirin prior to admission. I discussed with patient's wife who confirmed that patient was not  compliant with aspirin prior to admission. Hence we will continue aspirin 325 mg daily. - Patient passed swallow testing, started on heart healthy diet. - Lipid panel showed LDL 91 , goal less than 70, placed on statin - Hemoglobin A1c 5.3 - PT evaluation -> inpatient rehabilitation  Active Problems:   HTN (hypertension) emergency/malignant hypertension - BP 205/123 on admission - Added lisinopril 20 mg daily and Norvasc 5 mg daily - Continue hydralazine  as needed with parameters  Hyperlipidemia LDL 91, goal less than 70, placed on statin    Nicotine dependence - Counseled strongly on smoking cessation  Cocaine abuse - UDS positive on cocaine, counseled strongly on cocaine cessation    Alcohol abuse - Place on CIWA protocol, currently stable, no acute withdrawals   Code Status: Full code  Family Communication: Discussed in detail with the patient, all imaging results, lab results explained to the patient and wife at the bedside  Disposition Plan: CIR when bed available vs SNF   Time Spent in minutes 15 minutes  Procedures  MRI  MRA  2-D echo, carotid Dopplers  Consults   Neurology  DVT Prophylaxis  Lovenox   Medications  Scheduled Meds: . [START ON 07/08/2015] amLODipine  10 mg Oral Daily  . antiseptic oral rinse  7 mL Mouth Rinse q12n4p  . aspirin  300 mg Rectal Daily   Or  . aspirin  325 mg Oral Daily  . atorvastatin  20 mg Oral q1800  . chlorhexidine  15 mL Mouth Rinse BID  . enoxaparin (LOVENOX) injection  40 mg Subcutaneous Q24H  . folic acid  1 mg Oral Daily  . [START ON 07/08/2015] lisinopril  40 mg Oral Daily  . multivitamin with minerals  1 tablet Oral Daily  . thiamine  100 mg Oral Daily   Continuous Infusions:   PRN Meds:.hydrALAZINE, senna-docusate   Antibiotics   Anti-infectives    None         Subjective:   Gerald [name] was seen and examined today. Persisting Right-sided weakness, no significant improvement, no further new deficits. Patient denies any chest pain, shortness of breath, abdominal pain, N/V/D/C. no acute issues  Objective:   Blood pressure 171/96, pulse 79, temperature 98.7 F (37.1 C), temperature source Oral, resp. rate 16, height 6' (1.829 m), weight 77.066 kg (169 lb 14.4 oz), SpO2 97 %.  Wt Readings from Last 3 Encounters:  07/03/15 77.066 kg (169 lb 14.4 oz)  01/16/13 77.021 kg (169 lb 12.8 oz)     Intake/Output Summary (Last 24 hours) at 07/07/15 1525 Last data filed at 07/06/15 2000  Gross per 24 hour  Intake    700 ml  Output    300 ml  Net    400 ml    Exam  General: Alert and oriented x 3, NAD, speech has improved  HEENT:  PERRLA, EOMI  Neck: Supple, no JVD, no masses  CVS: S1 S2 clear, RRR  Respiratory: CTAB  Abdomen: Soft, NT, ND, NBS  Ext: no cyanosis clubbing or edema  Neuro: AAOx3, strength 5/5 left upper and lower extremity, 0/5 RUE, RLE  Skin: No rashes  Psych: Normal affect and demeanor, alert and oriented x3    Data Review   Micro Results No results found for this or any previous visit (from the past 240 hour(s)).  Radiology Reports Ct Head Wo Contrast  07/02/2015  CLINICAL DATA:  Right-sided paralysis and facial droop. Initial symptoms at 4 a.m. this morning. EXAM: CT HEAD WITHOUT CONTRAST TECHNIQUE: Contiguous axial images were obtained from the base of the skull through the vertex without intravenous contrast. COMPARISON:  Brain MRI from earlier same day. Comparison also to head CT dated 01/15/2013. FINDINGS: There is ill-defined areas of edema within the medial and anterior aspects of the left frontal lobe, new compared to the earlier head CT, compatible with the acute infarction seen on the recent brain MRI. Edema extends inferiorly to the level of left periventricular white matter and the genu of the  corpus callosum. There is associated mild sulcal effacement. No midline shift or herniation. No parenchymal hemorrhage. No extra-axial hemorrhage. Additional mild chronic small vessel ischemic change noted within the right periventricular white matter. Osseous structures are unremarkable. Visualized upper paranasal sinuses are clear. Mastoid air cells are clear. Superficial soft tissues are unremarkable. IMPRESSION: 1. Ill-defined edema within the medial and anterior aspects of the left frontal lobe, extending inferiorly to the level of the left periventricular white matter and genu of the corpus callosum, compatible with the acute ACA distribution infarction identified on brain MRI performed just before this head CT. 2. Associated mild sulcal effacement. No midline shift or  herniation. 3. No intracranial hemorrhage. Electronically Signed   By: Franki Cabot M.D.   On: 07/02/2015 21:05   Mr Brain Wo Contrast (neuro Protocol)  07/02/2015  CLINICAL DATA:  Right-sided paralysis with facial droop. Last seen normal yesterday at 8 p.m. EXAM: MRI HEAD WITHOUT CONTRAST MRA HEAD WITHOUT CONTRAST TECHNIQUE: Multiplanar, multiecho pulse sequences of the brain and surrounding structures were obtained without intravenous contrast. Angiographic images of the head were obtained using MRA technique without contrast. COMPARISON:  MRI brain 01/17/2013. FINDINGS: MRI HEAD FINDINGS There is a moderately large area of acute infarction, nonhemorrhagic, affecting the LEFT hemisphere within the LEFT anterior cerebral artery distribution. Both focal and confluent areas of acute infarction affect the medial and superior frontal lobe, parasagittal frontal lobe, and cingulate gyrus, regional white matter, as well as the genu of the corpus callosum on the LEFT. No MCA territory involvement. No right-sided involvement. No hemorrhage, mass lesion, hydrocephalus, or extra-axial fluid. Premature for age cerebral and cerebellar atrophy. Extensive  focal and confluent white matter signal abnormality, predominantly supratentorial but also affecting the brainstem, consistent with chronic microvascular ischemic change. Numerous chronic lacunar infarcts affect the deep nuclei and periventricular white matter, with greatest involvement in the LEFT basal ganglia. Flow voids are maintained in the carotid, basilar, and both vertebral arteries. Tiny foci of chronic hemorrhage can be seen in the cerebellum, brainstem, and RIGHT thalamus, likely sequelae of hypertensive cerebrovascular disease. Pituitary, pineal, and cerebellar tonsils unremarkable. No upper cervical lesions. Visualized calvarium, skull base, and upper cervical osseous structures unremarkable. Scalp and extracranial soft tissues, orbits, sinuses, and mastoids show no acute process. MRA HEAD FINDINGS The internal carotid arteries are widely patent. The basilar artery is widely patent with vertebrals codominant. No RIGHT or LEFT MCA disease. The RIGHT A1 ACA is mildly irregular without focal narrowing. The LEFT A1 ACA is moderately diseased in its distal A1 segment, estimated 50-75% stenosis. The LEFT A2 ACA is diffusely narrowed, and there is an acute occlusion of the LEFT anterior cerebral artery in its mid A2 segment just proximal to the origin of the frontopolar branch. Fetal origin LEFT PCA. Mildly diseased RIGHT PCA and severely narrowed LEFT PCA suggesting intracranial atherosclerotic disease. Moderately irregular BILATERAL MCA branches also consistent with intracranial atherosclerotic disease. No cerebellar branch occlusion.  No intracranial aneurysm. IMPRESSION: Acute nonhemorrhagic LEFT anterior cerebral artery territory infarction affecting the medial and anterior frontal lobe including the cingulate gyrus and regional white matter. Advanced chronic microvascular ischemic change, numerous lacunar infarcts, chronic microbleeds, and premature for age atrophy, favored to represent sequelae of  hypertensive cerebrovascular disease. 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 No large vessel occlusion is evident. Electronically Signed   By: Staci Righter M.D.   On: 07/02/2015 20:55   Mr Virgel Paling (cerebral Arteries)  07/02/2015  CLINICAL DATA:  Right-sided paralysis with facial droop. Last seen normal yesterday at 8 p.m. EXAM: MRI HEAD WITHOUT CONTRAST MRA HEAD WITHOUT CONTRAST TECHNIQUE: Multiplanar, multiecho pulse sequences of the brain and surrounding structures were obtained without intravenous contrast. Angiographic images of the head were obtained using MRA technique without contrast. COMPARISON:  MRI brain 01/17/2013. FINDINGS: MRI HEAD FINDINGS There is a moderately large area of acute infarction, nonhemorrhagic, affecting the LEFT hemisphere within the LEFT anterior cerebral artery distribution. Both focal and confluent areas of acute infarction affect the medial and superior frontal lobe, parasagittal frontal lobe, and cingulate gyrus, regional white matter, as well as the genu of the corpus callosum on the  LEFT. No MCA territory involvement. No right-sided involvement. No hemorrhage, mass lesion, hydrocephalus, or extra-axial fluid. Premature for age cerebral and cerebellar atrophy. Extensive focal and confluent white matter signal abnormality, predominantly supratentorial but also affecting the brainstem, consistent with chronic microvascular ischemic change. Numerous chronic lacunar infarcts affect the deep nuclei and periventricular white matter, with greatest involvement in the LEFT basal ganglia. Flow voids are maintained in the carotid, basilar, and both vertebral arteries. Tiny foci of chronic hemorrhage can be seen in the cerebellum, brainstem, and RIGHT thalamus, likely sequelae of hypertensive cerebrovascular disease. Pituitary, pineal, and cerebellar tonsils unremarkable. No upper cervical lesions. Visualized calvarium, skull base, and upper cervical  osseous structures unremarkable. Scalp and extracranial soft tissues, orbits, sinuses, and mastoids show no acute process. MRA HEAD FINDINGS The internal carotid arteries are widely patent. The basilar artery is widely patent with vertebrals codominant. No RIGHT or LEFT MCA disease. The RIGHT A1 ACA is mildly irregular without focal narrowing. The LEFT A1 ACA is moderately diseased in its distal A1 segment, estimated 50-75% stenosis. The LEFT A2 ACA is diffusely narrowed, and there is an acute occlusion of the LEFT anterior cerebral artery in its mid A2 segment just proximal to the origin of the frontopolar branch. Fetal origin LEFT PCA. Mildly diseased RIGHT PCA and severely narrowed LEFT PCA suggesting intracranial atherosclerotic disease. Moderately irregular BILATERAL MCA branches also consistent with intracranial atherosclerotic disease. No cerebellar branch occlusion.  No intracranial aneurysm. IMPRESSION: Acute nonhemorrhagic LEFT anterior cerebral artery territory infarction affecting the medial and anterior frontal lobe including the cingulate gyrus and regional white matter. 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 No large vessel occlusion is evident. Electronically Signed   By: Staci Righter M.D.   On: 07/02/2015 20:55    CBC  Recent Labs Lab 07/02/15 1922 07/02/15 1925 07/03/15 0311 07/05/15 0513  WBC 13.4*  --  10.7* 7.7  HGB 15.0 17.0 14.0 14.2  HCT 44.5 50.0 42.2 42.1  PLT 273  --  276 282  MCV 96.1  --  94.4 95.2  MCH 32.4  --  31.3 32.1  MCHC 33.7  --  33.2 33.7  RDW 12.9  --  12.9 12.7  LYMPHSABS 1.0  --   --   --   MONOABS 0.6  --   --   --   EOSABS 0.0  --   --   --   BASOSABS 0.0  --   --   --     Chemistries   Recent Labs Lab 07/02/15 1922  07/02/15 1925 07/03/15 0311 07/05/15 0513 07/06/15 0800 07/07/15 1012  NA 137 139 137 140 143 141  K 4.0 3.9 3.9 3.6 3.5 3.6  CL 105 103 105 108 112* 108  CO2 21*  --  22 24 25 24   GLUCOSE 110* 107* 115* 96 137* 132*  BUN 9 11 8 8 8 7   CREATININE 0.90 0.70 0.92 0.95 1.03 0.92  CALCIUM 9.5  --  9.1 9.0 9.1 9.5  AST 32  --  28  --   --   --   ALT 23  --  22  --   --   --   ALKPHOS 57  --  53  --   --   --   BILITOT 1.2  --  1.5*  --   --   --    ------------------------------------------------------------------------------------------------------------------  estimated creatinine clearance is 102.4 mL/min (by C-G formula based on Cr of 0.92). ------------------------------------------------------------------------------------------------------------------ No results for input(s): HGBA1C in the last 72 hours. ------------------------------------------------------------------------------------------------------------------ No results for input(s): CHOL, HDL, LDLCALC, TRIG, CHOLHDL, LDLDIRECT in the last 72 hours. ------------------------------------------------------------------------------------------------------------------ No results for input(s): TSH, T4TOTAL, T3FREE, THYROIDAB in the last 72 hours.  Invalid input(s): FREET3 ------------------------------------------------------------------------------------------------------------------ No results for input(s): VITAMINB12, FOLATE, FERRITIN, TIBC, IRON, RETICCTPCT in the last 72 hours.  Coagulation profile  Recent Labs Lab 07/02/15 1922  INR 1.17    No results for input(s): DDIMER in the last 72 hours.  Cardiac Enzymes No results for input(s): CKMB, TROPONINI, MYOGLOBIN in the last 168 hours.  Invalid input(s): CK ------------------------------------------------------------------------------------------------------------------ Invalid input(s): [place]  07/05/15 2140 07/06/15 0750 07/06/15 1204  07/06/15 1647 07/07/15 0746 07/07/15 1154  GLUCAP 105* 134* 106* 112* 104* 109*     Sobia Karger M.D. [facility] 07/07/2015, 3:25 PM  Pager: [phone] Between 7am to 7pm - call Pager - [phone]  After 7pm go to [email or web address] - password [email or web address]  Call night coverage person covering after 7pm

## 2015-07-07 NOTE — Progress Notes (Signed)
Physical Therapy Treatment Patient Details Name: Gerald [name] MRN: [id number] DOB: November 24, 1960 Today's Date: 07/07/2015    History of Present Illness Pt adm with rt hemiplegia. MRI positive for Lt CVA. PMH - HTN, CVA's    PT Comments    Pt with steady progress. Continue to feel pt can benefit from CIR.  Follow Up Recommendations  CIR     Equipment Recommendations  Other (comment) (to be assessed)    Recommendations for Other Services Rehab consult     Precautions / Restrictions Precautions Precautions: Fall Restrictions Weight Bearing Restrictions: No    Mobility  Bed Mobility Overal bed mobility: Needs Assistance Bed Mobility: Rolling;Sidelying to Sit Rolling: Min assist (to rt with verbal cues) Sidelying to sit: Max assist       General bed mobility comments: Assist to bring RLE off of bed  and to elevate trunk into sitting  Transfers Overall transfer level: Needs assistance Equipment used: Ambulation equipment used Transfers: Sit to/from [facility] Sit to Stand: +2 physical assistance;Mod assist Stand pivot transfers: +2 physical assistance;Max assist       General transfer comment: Assist to bring hips up, support RLE, and correct heavy Rt lateral lean. Used Stedy for pivot with assist to correct Rt lateral lean even when sitting on stedy  Ambulation/Gait                 Stairs            Wheelchair Mobility    Modified Rankin (Stroke Patients Only) Modified Rankin (Stroke Patients Only) Pre-Morbid Rankin Score: No symptoms Modified Rankin: Severe disability     Balance Overall balance assessment: Needs assistance Sitting-balance support: Single extremity supported;Feet supported Sitting balance-Leahy Scale: Poor Sitting balance - Comments: Sat EOB x 6-8 minutes with min to mod A due to heavy rt lateral lean. Pt able to hold onto foot of bed with LUE to assist with balance. Postural control: Right lateral  lean Standing balance support: Single extremity supported Standing balance-Leahy Scale: Poor Standing balance comment: Steady and +2 mod A for static standing. Manual support to prevent rt knee buckling. Rt lateral lean.                    Cognition Arousal/Alertness: Awake/alert Behavior During Therapy: WFL for tasks assessed/performed Overall Cognitive Status: Impaired/Different from baseline Area of Impairment: Following commands;Safety/judgement;Problem solving       Following Commands: Follows one step commands consistently;Follows one step commands with increased time Safety/Judgement: Decreased awareness of safety;Decreased awareness of deficits   Problem Solving: Slow processing;Decreased initiation;Difficulty sequencing;Requires verbal cues;Requires tactile cues General Comments: Improved communication today    Exercises      General Comments        Pertinent Vitals/Pain Pain Assessment: No/denies pain Faces Pain Scale: No hurt    Home Living                      Prior Function            PT Goals (current goals can now be found in the care plan section) Progress towards PT goals: Progressing toward goals    Frequency  Min 4X/week    PT Plan Current plan remains appropriate    Co-evaluation             End of Session Equipment Utilized During Treatment: Gait belt Activity Tolerance: Patient tolerated treatment well Patient left: with call bell/phone within reach;in chair;with chair alarm set  Time: [id number] PT Time Calculation (min) (ACUTE ONLY): 20 min  Charges:  $Therapeutic Activity: 8-22 mins                    G Codes:      Gerald [name] 07-24-15, 10:20 AM Gerald [name] PT [phone]
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
## 2015-07-07 NOTE — Progress Notes (Signed)
Speech Language Pathology Treatment: Cognitive-Linquistic  Patient Details Name: Gerald [name] MRN: [id number] DOB: [birth year] Today's Date: 07/07/2015 Time: 1348-1400 SLP Time Calculation (min) (ACUTE ONLY): 12 min  Assessment / Plan / Recommendation Clinical Impression  ST follow up for treatment of aphasia.  The patient appeared to have some improvement in his receptive and expressive language skills.  Currently he was able to identify pictures in a field of 10, identify pictures by function, read and comprehend short sentences, name all the months of the year, name all the days of the week and count 1-20.  He was able to name pictures with 85% accuracy.  Perservation of previous answer noted periodically.  Finally he was able to consistently follow simple 2 step commands.   Recommend continued ST treatment to address aphasia.     HPI HPI: Gerald [name] is an 55 y.o. male with a history of hypertension and alcohol use, presenting with new onset hemiplegia and slurred speech. He has not been compliant with taking antihypertensive medication. CT scan of his head, as well as MRI showed acute left ACA territory ischemic infarction.       SLP Plan  Continue with current plan of care     Recommendations   Continued ST Tx.              Follow up Recommendations: Inpatient Rehab Plan: Continue with current plan of care     [place], MA, CCC-SLP Acute Rehab SLP [phone] Lamar Sprinkles 07/07/2015, 2:02 PM

## 2015-07-08 ENCOUNTER — Inpatient Hospital Stay (HOSPITAL_COMMUNITY)
Admission: AD | Admit: 2015-07-08 | Discharge: 2015-08-06 | DRG: 057 | Disposition: A | Discharge status: Home Health | Payer: Medicaid Other | Source: Intra-hospital | Attending: Physical Medicine & Rehabilitation | Admitting: Physical Medicine & Rehabilitation

## 2015-07-08 DIAGNOSIS — R159 Full incontinence of feces: Secondary | ICD-10-CM | POA: Diagnosis not present

## 2015-07-08 DIAGNOSIS — F1410 Cocaine abuse, uncomplicated: Secondary | ICD-10-CM

## 2015-07-08 DIAGNOSIS — I69320 Aphasia following cerebral infarction: Secondary | ICD-10-CM | POA: Diagnosis not present

## 2015-07-08 DIAGNOSIS — R252 Cramp and spasm: Secondary | ICD-10-CM

## 2015-07-08 DIAGNOSIS — B9562 Methicillin resistant Staphylococcus aureus infection as the cause of diseases classified elsewhere: Secondary | ICD-10-CM

## 2015-07-08 DIAGNOSIS — R32 Unspecified urinary incontinence: Secondary | ICD-10-CM | POA: Diagnosis not present

## 2015-07-08 DIAGNOSIS — A4902 Methicillin resistant Staphylococcus aureus infection, unspecified site: Secondary | ICD-10-CM | POA: Insufficient documentation

## 2015-07-08 DIAGNOSIS — E8809 Other disorders of plasma-protein metabolism, not elsewhere classified: Secondary | ICD-10-CM

## 2015-07-08 DIAGNOSIS — F17210 Nicotine dependence, cigarettes, uncomplicated: Secondary | ICD-10-CM

## 2015-07-08 DIAGNOSIS — Z79899 Other long term (current) drug therapy: Secondary | ICD-10-CM

## 2015-07-08 DIAGNOSIS — L0231 Cutaneous abscess of buttock: Secondary | ICD-10-CM

## 2015-07-08 DIAGNOSIS — I69351 Hemiplegia and hemiparesis following cerebral infarction affecting right dominant side: Principal | ICD-10-CM

## 2015-07-08 DIAGNOSIS — Z8673 Personal history of transient ischemic attack (TIA), and cerebral infarction without residual deficits: Secondary | ICD-10-CM

## 2015-07-08 DIAGNOSIS — I63522 Cerebral infarction due to unspecified occlusion or stenosis of left anterior cerebral artery: Secondary | ICD-10-CM | POA: Diagnosis present

## 2015-07-08 DIAGNOSIS — I69392 Facial weakness following cerebral infarction: Secondary | ICD-10-CM | POA: Diagnosis not present

## 2015-07-08 DIAGNOSIS — F1010 Alcohol abuse, uncomplicated: Secondary | ICD-10-CM | POA: Diagnosis not present

## 2015-07-08 DIAGNOSIS — E785 Hyperlipidemia, unspecified: Secondary | ICD-10-CM

## 2015-07-08 DIAGNOSIS — G8191 Hemiplegia, unspecified affecting right dominant side: Secondary | ICD-10-CM

## 2015-07-08 DIAGNOSIS — F1090 Alcohol use, unspecified, uncomplicated: Secondary | ICD-10-CM | POA: Diagnosis present

## 2015-07-08 DIAGNOSIS — I10 Essential (primary) hypertension: Secondary | ICD-10-CM | POA: Diagnosis not present

## 2015-07-08 DIAGNOSIS — I69322 Dysarthria following cerebral infarction: Secondary | ICD-10-CM | POA: Diagnosis not present

## 2015-07-08 DIAGNOSIS — L02415 Cutaneous abscess of right lower limb: Secondary | ICD-10-CM

## 2015-07-08 DIAGNOSIS — Z7982 Long term (current) use of aspirin: Secondary | ICD-10-CM

## 2015-07-08 DIAGNOSIS — I63322 Cerebral infarction due to thrombosis of left anterior cerebral artery: Secondary | ICD-10-CM

## 2015-07-08 LAB — CREATININE, SERUM
Creatinine, Ser: 0.98 mg/dL (ref 0.61–1.24)
GFR calc Af Amer: >60 mL/min (ref >60)
GFR calc non Af Amer: >60 mL/min (ref >60)

## 2015-07-08 LAB — CBC
HCT: 44.8 % (ref 39.0–52.0)
Hemoglobin: 14.6 g/dL (ref 13.0–17.0)
MCH: 31.5 pg (ref 26.0–34.0)
MCHC: 32.6 g/dL (ref 30.0–36.0)
MCV: 96.8 fL (ref 78.0–100.0)
Platelets: 295 10*3/uL (ref 150–400)
RBC: 4.63 MIL/uL (ref 4.22–5.81)
RDW: 12.8 % (ref 11.5–15.5)
WBC: 8.1 10*3/uL (ref 4.0–10.5)

## 2015-07-08 LAB — GLUCOSE, CAPILLARY
Glucose-Capillary: 106 mg/dL — ABNORMAL HIGH (ref 65–99)
Glucose-Capillary: 99 mg/dL (ref 65–99)

## 2015-07-08 MED ORDER — ENOXAPARIN SODIUM 40 MG/0.4ML ~~LOC~~ SOLN
40.0000 mg | SUBCUTANEOUS | Status: DC
  Administered 2015-07-08 – 2015-08-05 (×29): 40 mg via SUBCUTANEOUS
  Filled 2015-07-08 (×30): qty 0.4

## 2015-07-08 MED ORDER — ONDANSETRON HCL 4 MG PO TABS: 4.0000 mg | ORAL_TABLET | Freq: Four times a day (QID) | ORAL | Status: DC | PRN

## 2015-07-08 MED ORDER — ATORVASTATIN CALCIUM 20 MG PO TABS: 20.0000 mg | ORAL_TABLET | Freq: Every day | ORAL | Status: DC

## 2015-07-08 MED ORDER — ACETAMINOPHEN 325 MG PO TABS
325.0000 mg | ORAL_TABLET | ORAL | Status: DC | PRN
  Administered 2015-07-13 – 2015-08-02 (×2): 650 mg via ORAL
  Filled 2015-07-08 (×2): qty 2

## 2015-07-08 MED ORDER — AMLODIPINE BESYLATE 10 MG PO TABS
10.0000 mg | ORAL_TABLET | Freq: Every day | ORAL | Status: DC
  Administered 2015-07-09 – 2015-08-06 (×29): 10 mg via ORAL
  Filled 2015-07-08 (×29): qty 1

## 2015-07-08 MED ORDER — ENOXAPARIN SODIUM 40 MG/0.4ML ~~LOC~~ SOLN: 40.0000 mg | SUBCUTANEOUS | Status: DC

## 2015-07-08 MED ORDER — VITAMIN B-1 100 MG PO TABS
100.0000 mg | ORAL_TABLET | Freq: Every day | ORAL | Status: DC
  Administered 2015-07-09 – 2015-08-06 (×29): 100 mg via ORAL
  Filled 2015-07-08 (×29): qty 1

## 2015-07-08 MED ORDER — AMLODIPINE BESYLATE 10 MG PO TABS: 10.0000 mg | ORAL_TABLET | Freq: Every day | ORAL | Status: DC

## 2015-07-08 MED ORDER — ADULT MULTIVITAMIN W/MINERALS CH
1.0000 | ORAL_TABLET | Freq: Every day | ORAL | Status: DC
  Administered 2015-07-09 – 2015-08-06 (×29): 1 via ORAL
  Filled 2015-07-08 (×29): qty 1

## 2015-07-08 MED ORDER — HYDRALAZINE HCL 25 MG PO TABS: 25.0000 mg | ORAL_TABLET | Freq: Two times a day (BID) | ORAL | Status: DC

## 2015-07-08 MED ORDER — LISINOPRIL 40 MG PO TABS: 40.0000 mg | ORAL_TABLET | Freq: Every day | ORAL | Status: DC

## 2015-07-08 MED ORDER — ONDANSETRON HCL 4 MG/2ML IJ SOLN: 4.0000 mg | Freq: Four times a day (QID) | INTRAMUSCULAR | Status: DC | PRN

## 2015-07-08 MED ORDER — LISINOPRIL 40 MG PO TABS
40.0000 mg | ORAL_TABLET | Freq: Every day | ORAL | Status: DC
  Administered 2015-07-09 – 2015-08-06 (×29): 40 mg via ORAL
  Filled 2015-07-08 (×29): qty 1

## 2015-07-08 MED ORDER — SORBITOL 70 % SOLN: 30.0000 mL | Freq: Every day | Status: DC | PRN

## 2015-07-08 MED ORDER — FOLIC ACID 1 MG PO TABS
1.0000 mg | ORAL_TABLET | Freq: Every day | ORAL | Status: DC
  Administered 2015-07-09 – 2015-08-06 (×29): 1 mg via ORAL
  Filled 2015-07-08 (×29): qty 1

## 2015-07-08 MED ORDER — HYDRALAZINE HCL 25 MG PO TABS
25.0000 mg | ORAL_TABLET | Freq: Two times a day (BID) | ORAL | Status: DC
  Administered 2015-07-08: 25 mg via ORAL
  Filled 2015-07-08: qty 1

## 2015-07-08 MED ORDER — ATORVASTATIN CALCIUM 20 MG PO TABS
20.0000 mg | ORAL_TABLET | Freq: Every day | ORAL | Status: DC
  Administered 2015-07-08 – 2015-08-05 (×29): 20 mg via ORAL
  Filled 2015-07-08 (×29): qty 1

## 2015-07-08 MED ORDER — ASPIRIN 325 MG PO TABS
325.0000 mg | ORAL_TABLET | Freq: Every day | ORAL | Status: DC
  Administered 2015-07-09 – 2015-08-06 (×29): 325 mg via ORAL
  Filled 2015-07-08 (×29): qty 1

## 2015-07-08 NOTE — Discharge Summary (Signed)
Physician Discharge Summary   Patient ID: [name] MRN: [id number] DOB/AGE: Nov 11, 1960 55 y.o.  Admit date: 07/02/2015 Discharge date: 07/08/2015  Primary Care Physician:  No PCP Per Patient  Discharge Diagnoses:   . Acute ischemic stroke ([place]) . malignant hypertension  . hyperlipidemia  . Nicotine dependence . Alcohol abuse . noncompliance  Substance abuse/cocaine   Consults: Neurology, Dr. Leonie Man  Inpatient rehabilitation   Recommendations for Outpatient Follow-up:  1. PT evaluation recommended inpatient rehabilitation  2. Please repeat CBC/BMET at next visit 3. Patient may need further adjustments of his antihypertensives before discharge from inpatient rehabilitation   DIET: Heart healthy diet   Allergies:  No Known Allergies   DISCHARGE MEDICATIONS: Current Discharge Medication List    START taking these medications   Details  amLODipine (NORVASC) 10 MG tablet Take 1 tablet (10 mg total) by mouth daily.    atorvastatin (LIPITOR) 20 MG tablet Take 1 tablet (20 mg total) by mouth daily at 6 PM.    hydrALAZINE (APRESOLINE) 25 MG tablet Take 1 tablet (25 mg total) by mouth 2 (two) times daily.      CONTINUE these medications which have CHANGED   Details  lisinopril (PRINIVIL,ZESTRIL) 40 MG tablet Take 1 tablet (40 mg total) by mouth daily.      CONTINUE these medications which have NOT CHANGED   Details  aspirin EC 325 MG EC tablet Take 1 tablet (325 mg total) by mouth daily. Qty: 30 tablet, Refills: 0    hydrochlorothiazide (HYDRODIURIL) 25 MG tablet Take 1 tablet (25 mg total) by mouth daily. Qty: 30 tablet, Refills: 0    senna-docusate (SENOKOT-S) 8.6-50 MG per tablet Take 1 tablet by mouth at bedtime as needed. Qty: 30 tablet, Refills: 0         Brief H and P: For complete details please refer to admission H and P, but in brief Gerald [name] is a 55 y.o. male with a past medical history significant for HTN, smoking, and CVA in 2014 who  presented with right sided weakness. Patient was in his normal state of health until he went to the bed the night before the admission, woke up at 4 AM feeling right-sided weakness. In ED, he was hypertensive greater than 200/120, coags were normal, troponin was normal, renal function was normal, and a CT head showed left-sided changes. A follow-up MRI MRA showed acute nonhemorrhagic CVA in the left ACA distribution affecting medial, superior frontal lobe, parasagittal frontal lobe, and cingulate gyrus. Neurology evaluated the patient.Patient was admitted for further workup  Hospital Course:   Acute CVA- presenting with right-sided hemiparesis, slurred speech - Delayed presentation hence not a TPA candidate. Neurology was consulted. - MRI brain showed acute nonhemorrhagic left anterior cerebral artery territory infarction affecting the medial and anterior frontal lobe including the single regardless, additional white matter. Advanced chronic microvascular ischemic changes, numerous lacunar infarcts, chronic micro-bleeds representing secretly of hypertensive cerebrovascular disease. - MRA showed diseased/occluded left A2 ACA - 2-D echo results EF 0000000, grade 1 diastolic dysfunction - Carotid Dopplers 1-39% ICA stenosis  - Neurology recommended to continue aspirin 325 mg daily. I discussed with neurology, recommended to continue aspirin 325 mg daily if patient was noncompliant with aspirin prior to admission. I discussed with patient's wife who confirmed that patient was not compliant with aspirin prior to admission. Hence we will continue aspirin 325 mg daily. - Patient passed swallow testing, started on heart healthy diet. - Lipid panel showed LDL 91 , goal  less than 70, placed on statin - Hemoglobin A1c 5.3 - PT evaluation -> inpatient rehabilitation, accepted    HTN (hypertension) emergency/malignant hypertension - BP 205/123 on admission - Added lisinopril 40 mg daily and Norvasc 5 mg  daily - Added hydralazine 25 mg twice a day  Hyperlipidemia LDL 91, goal less than 70, placed on statin   Nicotine dependence - Counseled strongly on smoking cessation  Cocaine abuse - UDS positive on cocaine, counseled strongly on cocaine cessation   Alcohol abuse - Place on CIWA protocol, currently stable, no acute withdrawals     Day of Discharge BP 160/77 mmHg  Pulse 81  Temp(Src) 98.2 F (36.8 C) (Oral)  Resp 18  Ht 6' (1.829 m)  Wt 77.066 kg (169 lb 14.4 oz)  BMI 23.04 kg/m2  SpO2 96%  Physical Exam: General: Alert and awake oriented x3 not in any acute distress. Slurred speech HEENT: anicteric sclera, pupils reactive to light and accommodation CVS: S1-S2 clear no murmur rubs or gallops Chest: clear to auscultation bilaterally, no wheezing rales or rhonchi Abdomen: soft nontender, nondistended, normal bowel sounds Extremities: no cyanosis, clubbing or edema noted bilaterally Neuro: right-sided hemiparesis   The results of significant diagnostics from this hospitalization (including imaging, microbiology, ancillary and laboratory) are listed below for reference.    LAB RESULTS: Basic Metabolic Panel:  Recent Labs Lab 07/06/15 0800 07/07/15 1012  NA 143 141  K 3.5 3.6  CL 112* 108  CO2 25 24  GLUCOSE 137* 132*  BUN 8 7  CREATININE 1.03 0.92  CALCIUM 9.1 9.5   Liver Function Tests:  Recent Labs Lab 07/02/15 1922 07/03/15 0311  AST 32 28  ALT 23 22  ALKPHOS 57 53  BILITOT 1.2 1.5*  PROT 7.1 6.6  ALBUMIN 3.9 3.5   No results for input(s): LIPASE, AMYLASE in the last 168 hours. No results for input(s): AMMONIA in the last 168 hours. CBC:  Recent Labs Lab 07/02/15 1922  07/03/15 0311 07/05/15 0513  WBC 13.4*  --  10.7* 7.7  NEUTROABS 11.8*  --   --   --   HGB 15.0  < > 14.0 14.2  HCT 44.5  < > 42.2 42.1  MCV 96.1  --  94.4 95.2  PLT 273  --  276 282  < > = values in this interval not displayed. Cardiac Enzymes: No results for  input(s): CKTOTAL, CKMB, CKMBINDEX, TROPONINI in the last 168 hours. BNP: Invalid input(s): POCBNP CBG:  Recent Labs Lab 07/07/15 2051 07/08/15 0751  GLUCAP 108* 106*    Significant Diagnostic Studies:  Ct Head Wo Contrast  07/02/2015  CLINICAL DATA:  Right-sided paralysis and facial droop. Initial symptoms at 4 a.m. this morning. EXAM: CT HEAD WITHOUT CONTRAST TECHNIQUE: Contiguous axial images were obtained from the base of the skull through the vertex without intravenous contrast. COMPARISON:  Brain MRI from earlier same day. Comparison also to head CT dated 01/15/2013. FINDINGS: There is ill-defined areas of edema within the medial and anterior aspects of the left frontal lobe, new compared to the earlier head CT, compatible with the acute infarction seen on the recent brain MRI. Edema extends inferiorly to the level of left periventricular white matter and the genu of the corpus callosum. There is associated mild sulcal effacement. No midline shift or herniation. No parenchymal hemorrhage. No extra-axial hemorrhage. Additional mild chronic small vessel ischemic change noted within the right periventricular white matter. Osseous structures are unremarkable. Visualized upper paranasal sinuses are clear. Mastoid  air cells are clear. Superficial soft tissues are unremarkable. IMPRESSION: 1. Ill-defined edema within the medial and anterior aspects of the left frontal lobe, extending inferiorly to the level of the left periventricular white matter and genu of the corpus callosum, compatible with the acute ACA distribution infarction identified on brain MRI performed just before this head CT. 2. Associated mild sulcal effacement. No midline shift or herniation. 3. No intracranial hemorrhage. Electronically Signed   By: Franki Cabot M.D.   On: 07/02/2015 21:05   Mr Brain Wo Contrast (neuro Protocol)  07/02/2015  CLINICAL DATA:  Right-sided paralysis with facial droop. Last seen normal yesterday at 8  p.m. EXAM: MRI HEAD WITHOUT CONTRAST MRA HEAD WITHOUT CONTRAST TECHNIQUE: Multiplanar, multiecho pulse sequences of the brain and surrounding structures were obtained without intravenous contrast. Angiographic images of the head were obtained using MRA technique without contrast. COMPARISON:  MRI brain 01/17/2013. FINDINGS: MRI HEAD FINDINGS There is a moderately large area of acute infarction, nonhemorrhagic, affecting the LEFT hemisphere within the LEFT anterior cerebral artery distribution. Both focal and confluent areas of acute infarction affect the medial and superior frontal lobe, parasagittal frontal lobe, and cingulate gyrus, regional white matter, as well as the genu of the corpus callosum on the LEFT. No MCA territory involvement. No right-sided involvement. No hemorrhage, mass lesion, hydrocephalus, or extra-axial fluid. Premature for age cerebral and cerebellar atrophy. Extensive focal and confluent white matter signal abnormality, predominantly supratentorial but also affecting the brainstem, consistent with chronic microvascular ischemic change. Numerous chronic lacunar infarcts affect the deep nuclei and periventricular white matter, with greatest involvement in the LEFT basal ganglia. Flow voids are maintained in the carotid, basilar, and both vertebral arteries. Tiny foci of chronic hemorrhage can be seen in the cerebellum, brainstem, and RIGHT thalamus, likely sequelae of hypertensive cerebrovascular disease. Pituitary, pineal, and cerebellar tonsils unremarkable. No upper cervical lesions. Visualized calvarium, skull base, and upper cervical osseous structures unremarkable. Scalp and extracranial soft tissues, orbits, sinuses, and mastoids show no acute process. MRA HEAD FINDINGS The internal carotid arteries are widely patent. The basilar artery is widely patent with vertebrals codominant. No RIGHT or LEFT MCA disease. The RIGHT A1 ACA is mildly irregular without focal narrowing. The LEFT A1 ACA  is moderately diseased in its distal A1 segment, estimated 50-75% stenosis. The LEFT A2 ACA is diffusely narrowed, and there is an acute occlusion of the LEFT anterior cerebral artery in its mid A2 segment just proximal to the origin of the frontopolar branch. Fetal origin LEFT PCA. Mildly diseased RIGHT PCA and severely narrowed LEFT PCA suggesting intracranial atherosclerotic disease. Moderately irregular BILATERAL MCA branches also consistent with intracranial atherosclerotic disease. No cerebellar branch occlusion.  No intracranial aneurysm. IMPRESSION: Acute nonhemorrhagic LEFT anterior cerebral artery territory infarction affecting the medial and anterior frontal lobe including the cingulate gyrus and regional white matter. 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 No large vessel occlusion is evident. Electronically Signed   By: Staci Righter M.D.   On: 07/02/2015 20:55   Mr Virgel Paling (cerebral Arteries)  07/02/2015  CLINICAL DATA:  Right-sided paralysis with facial droop. Last seen normal yesterday at 8 p.m. EXAM: MRI HEAD WITHOUT CONTRAST MRA HEAD WITHOUT CONTRAST TECHNIQUE: Multiplanar, multiecho pulse sequences of the brain and surrounding structures were obtained without intravenous contrast. Angiographic images of the head were obtained using MRA technique  without contrast. COMPARISON:  MRI brain 01/17/2013. FINDINGS: MRI HEAD FINDINGS There is a moderately large area of acute infarction, nonhemorrhagic, affecting the LEFT hemisphere within the LEFT anterior cerebral artery distribution. Both focal and confluent areas of acute infarction affect the medial and superior frontal lobe, parasagittal frontal lobe, and cingulate gyrus, regional white matter, as well as the genu of  the corpus callosum on the LEFT. No MCA territory involvement. No right-sided involvement. No hemorrhage, mass lesion, hydrocephalus, or extra-axial fluid. Premature for age cerebral and cerebellar atrophy. Extensive focal and confluent white matter signal abnormality, predominantly supratentorial but also affecting the brainstem, consistent with chronic microvascular ischemic change. Numerous chronic lacunar infarcts affect the deep nuclei and periventricular white matter, with greatest involvement in the LEFT basal ganglia. Flow voids are maintained in the carotid, basilar, and both vertebral arteries. Tiny foci of chronic hemorrhage can be seen in the cerebellum, brainstem, and RIGHT thalamus, likely sequelae of hypertensive cerebrovascular disease. Pituitary, pineal, and cerebellar tonsils unremarkable. No upper cervical lesions. Visualized calvarium, skull base, and upper cervical osseous structures unremarkable. Scalp and extracranial soft tissues, orbits, sinuses, and mastoids show no acute process. MRA HEAD FINDINGS The internal carotid arteries are widely patent. The basilar artery is widely patent with vertebrals codominant. No RIGHT or LEFT MCA disease. The RIGHT A1 ACA is mildly irregular without focal narrowing. The LEFT A1 ACA is moderately diseased in its distal A1 segment, estimated 50-75% stenosis. The LEFT A2 ACA is diffusely narrowed, and there is an acute occlusion of the LEFT anterior cerebral artery in its mid A2 segment just proximal to the origin of the frontopolar branch. Fetal origin LEFT PCA. Mildly diseased RIGHT PCA and severely narrowed LEFT PCA suggesting intracranial atherosclerotic disease. Moderately irregular BILATERAL MCA branches also consistent with intracranial atherosclerotic disease. No cerebellar branch occlusion.  No intracranial aneurysm. IMPRESSION: Acute nonhemorrhagic LEFT anterior cerebral artery territory infarction affecting the medial and anterior frontal lobe  including the cingulate gyrus and regional white matter. 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 No large vessel occlusion is evident. Electronically Signed   By: Staci Righter M.D.   On: 07/02/2015 20:55    2D ECHO: Study Conclusions  - Left ventricle: The cavity size was normal. Wall thickness was increased in a pattern of moderate LVH. Systolic function was normal. The estimated ejection fraction was in the range of 55% to 60%. Wall motion was normal; there were no regional wall motion abnormalities. Doppler parameters are consistent with abnormal left ventricular relaxation (grade 1 diastolic dysfunction). - Aortic root: The aortic root was mildly dilated.  Impressions:  - Normal LV systolic function; moderate LVH; grade 1 diastolic dysfunction; trace MR and TR.  Disposition and Follow-up: Discharge Instructions    Ambulatory referral to Neurology    Complete by:  As directed   Stroke office f/u in 1 month with nurse practitioner            DISPOSITION: Inpatient rehabilitation   DISCHARGE FOLLOW-UP Follow-up Information    Follow up with SETHI,PRAMOD, MD. Schedule an appointment as soon as possible for a visit in 1 month.   Specialties:  Neurology, Radiology   Why:  for hospital follow-up/stroke   Contact information:   [address] [place] [address] [phone]        Time spent on Discharge: 35 minutes  SignedEstill Cotta M.D. [facility] 07/08/2015, 12:13 PM Pager: [phone]

## 2015-07-08 NOTE — Progress Notes (Signed)
I have an inpt rehab bed today for this pt. I have notified Dr. Tana Coast and I will arrange admission for this afternoon. RN CM and SW are aware. [id number]

## 2015-07-08 NOTE — Progress Notes (Signed)
PMR Admission Coordinator Pre-Admission Assessment  Patient: Gerald [name] is an 55 y.o., male MRN: [id number] DOB: 1960-06-20 Height: 6' (182.9 cm) Weight: 77.066 kg (169 lb 14.4 oz)  Insurance Information HMO: PPO: PCP: IPA: 80/20: OTHER:  PRIMARY: UNinsured   Medicaid Application Date: Case Manager:  Disability Application Date: Case Worker:  On 07/07/15 I left message for financial counselor, Sharyn Lull at # [phone] to request disability and Medicaid Applications to be initiated.  Emergency Contact Information Contact Information    Name Relation Home Work Mobile   [place] Significant other   [phone]   [place], [place]   [phone]   Mathews Argyle Sister   [phone]     Current Medical History  Patient Admitting Diagnosis: Left ACA infarct  History of Present Illness:Gerald [name] is a 55 y.o. 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 Presented 07/02/2015 with right-sided weakness and speech difficulty. Urine drug screen positive cocaine. Blood pressure 200/120. MRI of the brain showed acute nonhemorrhagic left anterior cerebral artery territory infarct affecting the medial and anterior frontal lobes. MRA of the head with no large vessel occlusion. Echocardiogram with ejection fraction of [id number] grade 1 diastolic dysfunction. Carotid Dopplers with no ICA stenosis. Neurology follow-up. Patient did not receive TPA. Advised to continue aspirin as prior to admission with questionable medical compliance. Tolerating a regular consistency diet.  Total: 13 NIH  Past  Medical History  Past Medical History  Diagnosis Date  . Hypertension   . Alcohol abuse   . Tobacco use   . Cocaine abuse 2014  . Stroke [facility]) 2014    denies residual on 07/03/2015  . Stroke [facility]) 07/02/2015    "now weak on right side; speech problems" (07/03/2015)    Family History  family history includes Hypertension in his father and mother; Stroke in his father.  Prior Rehab/Hospitalizations:  Has the patient had major surgery during 100 days prior to admission? No  Current Medications   Current facility-administered medications:  . amLODipine (NORVASC) tablet 10 mg, 10 mg, Oral, Daily, Ripudeep K Rai, MD . antiseptic oral rinse (CPC / CETYLPYRIDINIUM CHLORIDE 0.05%) solution 7 mL, 7 mL, Mouth Rinse, q12n4p, Ripudeep K Rai, MD, 7 mL at 07/07/15 1200 . [DISCONTINUED] aspirin suppository 300 mg, 300 mg, Rectal, Daily, 300 mg at 07/04/15 1031 **OR** aspirin tablet 325 mg, 325 mg, Oral, Daily, Edwin Dada, MD, 325 mg at 07/07/15 0948 . atorvastatin (LIPITOR) tablet 20 mg, 20 mg, Oral, q1800, Ripudeep K Rai, MD, 20 mg at 07/07/15 1709 . chlorhexidine (PERIDEX) 0.12 % solution 15 mL, 15 mL, Mouth Rinse, BID, Ripudeep K Rai, MD, 15 mL at 07/07/15 2259 . enoxaparin (LOVENOX) injection 40 mg, 40 mg, Subcutaneous, Q24H, Donzetta Starch, NP, 40 mg at 07/07/15 1705 . folic acid (FOLVITE) tablet 1 mg, 1 mg, Oral, Daily, Edwin Dada, MD, 1 mg at 07/07/15 0948 . hydrALAZINE (APRESOLINE) injection 10 mg, 10 mg, Intravenous, Q6H PRN, Ripudeep K Rai, MD, 10 mg at 07/06/15 2317 . lisinopril (PRINIVIL,ZESTRIL) tablet 40 mg, 40 mg, Oral, Daily, Ripudeep K Rai, MD . multivitamin with minerals tablet 1 tablet, 1 tablet, Oral, Daily, Edwin Dada, MD, 1 tablet at 07/07/15 0948 . senna-docusate (Senokot-S) tablet 1 tablet, 1 tablet, Oral, QHS PRN, Edwin Dada, MD . thiamine (VITAMIN B-1) tablet 100 mg, 100 mg, Oral, Daily, 100 mg  at 07/07/15 0948 **OR** [DISCONTINUED] thiamine (B-1) injection 100 mg, 100 mg, Intravenous, Daily, Edwin Dada, MD, 100 mg at 07/04/15 1031  Patients Current Diet: Diet Heart Room service appropriate?: Yes; Fluid consistency:: Thin  Precautions / Restrictions Precautions Precautions: Fall Restrictions Weight Bearing Restrictions: No   Has the patient had 2 or more falls or a fall with injury in the past year?No  Prior Activity Level Community (5-7x/wk): worked part time at [employer] 3-4 hrs per day . Does not drive.  Home Assistive Devices / Equipment Home Assistive Devices/Equipment: None Home Equipment: None  Prior Device Use: Indicate devices/aids used by the patient prior to current illness, exacerbation or injury? None of the above  Prior Functional Level Prior Function Level of Independence: Independent  Self Care: Did the patient need help bathing, dressing, using the toilet or eating? Independent  Indoor Mobility: Did the patient need assistance with walking from room to room (with or without device)? Independent  Stairs: Did the patient need assistance with internal or external stairs (with or without device)? Independent  Functional Cognition: Did the patient need help planning regular tasks such as shopping or remembering to take medications? Independent  Current Functional Level Cognition  Arousal/Alertness: Awake/alert Overall Cognitive Status: Impaired/Different from baseline Difficult to assess due to: Impaired communication Orientation Level: Oriented to person, Disoriented to situation, Oriented to place, Oriented to time Following Commands: Follows one step commands consistently, Follows one step commands with increased time Safety/Judgement: Decreased awareness of safety, Decreased awareness of deficits General Comments: Improved communication today   Extremity Assessment (includes Sensation/Coordination)  Upper Extremity Assessment:  RUE deficits/detail RUE Deficits / Details: Brunstrum 0, noted increased tone at elbow (encouraged his wife to help him straighten it back out as she notices it drawing up); currently can get full ellbow extension with minimal effort; pt does report he feels nailbed pressure on his 2nd digit, he said no to his 5th digit but there was an automatic withdrawl but delayed when nail bed pressure was applied to 5th digit. Demonstrated to pt's wife how to prop his RUE on pillow  Lower Extremity Assessment: RLE deficits/detail RLE Deficits / Details: Noted withdrawl to nail bed pressure (quicker on first digit, slower on 5th digit). Educated pt's wife on how to position pillows against bed rail at pt's knee on pt's right to help position pt's RLE in neutral alignment for hip    ADLs  Overall ADL's : Needs assistance/impaired Eating/Feeding: Set up, Bed level Grooming: Moderate assistance, Bed level Upper Body Bathing: Moderate assistance, Bed level Lower Body Bathing: Maximal assistance, Bed level Upper Body Dressing : Total assistance, Bed level Lower Body Dressing: Total assistance, Bed level    Mobility  Overal bed mobility: Needs Assistance Bed Mobility: Rolling, Sidelying to Sit Rolling: Min assist (to rt with verbal cues) Sidelying to sit: Max assist Supine to sit: Max assist Sit to supine: Mod assist General bed mobility comments: Assist to bring RLE off of bed and to elevate trunk into sitting    Transfers  Overall transfer level: Needs assistance Equipment used: Ambulation equipment used Transfer via Lift Equipment: Stedy Transfers: Sit to/from Stand, Risk manager Sit to Stand: +2 physical assistance, Mod assist Stand pivot transfers: +2 physical assistance, Max assist General transfer comment: Assist to bring hips up, support RLE, and correct heavy Rt lateral lean. Used Stedy for pivot with assist to correct Rt lateral lean even when sitting on stedy      Ambulation / Gait /  Stairs / Office manager / Balance Dynamic Sitting Balance Sitting balance - Comments: Sat EOB x 6-8 minutes with min to mod A due to heavy rt lateral lean. Pt able to hold onto foot of bed with LUE to assist with balance. Balance Overall balance assessment: Needs assistance Sitting-balance support: Single extremity supported, Feet supported Sitting balance-Leahy Scale: Poor Sitting balance - Comments: Sat EOB x 6-8 minutes with min to mod A due to heavy rt lateral lean. Pt able to hold onto foot of bed with LUE to assist with balance. Postural control: Right lateral lean Standing balance support: Single extremity supported Standing balance-Leahy Scale: Poor Standing balance comment: Steady and +2 mod A for static standing. Manual support to prevent rt knee buckling. Rt lateral lean.    Special needs/care consideration Skin intact  Bowel mgmt: incontinent LBM 07/06/15 Bladder mgmt: incontinent. Use of condom catheter Diabetic mgmt Hgb A1c 5.3 Positive cocaine on UDS on admit. Pt admits to pot use and alcohol. Sisters per pt unaware of drug use. Nyoka Cowden, is aware per pt of drug use. Pt noncompliant with HTN meds and ASA follow up since last CVA 2014 due to no PCP or insurance per sisters. Need follow up with financial counselor for disability and Medicaid applications during this admission. Smoker and alcohol use pta   Previous Home Environment Living Arrangements: Spouse/significant other (Has been with [name] for 31 years) Lives With: Significant other Available Help at Discharge: Family, Available 24 hours/day Type of Home: Apartment Home Layout: One level Home Access: Stairs to enter Entrance Stairs-Rails: Left Entrance Stairs-Number of Steps: 3 Bathroom Shower/Tub: Optometrist: Yes How Accessible: Accessible via walker [place]:  No Additional Comments: confirmed with sister home d/c layout  Discharge Living Setting Plans for Discharge Living Setting: Lives with (comment), Other (Comment) (Plans to go live with sister, Mathews Argyle and she and sister) Type of Home at Discharge: House Discharge Home Layout: One level Discharge Home Access: Stairs to enter Entrance Stairs-Rails: None Entrance Stairs-Number of Steps: 1 step in Discharge Bathroom Shower/Tub: Walk-in shower (with built in seat) Discharge Bathroom Toilet: Standard Discharge Bathroom Accessibility: Yes How Accessible: Accessible via wheelchair, Accessible via walker Does the patient have any problems obtaining your medications?: Yes (Describe) (uninsured. NO PCP or f/u since CVA 2014 due to no insurance) Sister, [name] unemployed. She cared for her spouse until he died last year. Home can have wheelchair or walker accessibility. Nyoka Cowden, works first shift. The plan is for sisters [name] and Dannielle Karvonen as well as Nyoka Cowden, to provide 24/7 physical min assist as needed in [name]'s home.  Social/Family/Support Systems Patient Roles: Partner, Other (Comment) (employed part time at [employer]) [employer] Information: Primary contact is girlfriend, [name] then sister [name] who is will d/c to her home. Has 4 sisters involved Anticipated Caregiver: [name] and sisters [name] and [place] Anticipated Ambulance person Information: see above Ability/Limitations of Caregiver: [name] works first shift, [name] unemployed 55 yo without any disability Caregiver Availability: 24/7 Discharge Plan Discussed with Primary Caregiver: Yes Is Caregiver In Agreement with Plan?: Yes Does Caregiver/Family have Issues with Lodging/Transportation while Pt is in Rehab?: No  Goals/Additional Needs Patient/Family Goal for Rehab: supervision to min assist with PT, OT and SLP Expected length of stay: ELOS 20- 24 days Special Service Needs: Pt positive drugs on admit. STates he does do  pot that [name] is aware of but his sisters are not aware  of his drug use Pt/Family Agrees to Admission and willing to participate: Yes Program Orientation Provided & Reviewed with Pt/Caregiver Including Roles & Responsibilities: Yes Information Needs to be Provided By: needs assist for PCP follow up as well as disability and Medicaid applications  Decrease burden of Care through IP rehab admission: n/a  Possible need for SNF placement upon discharge:not anticipated  Patient Condition: This patient's condition remains as documented in the consult dated 07/08/2015, in which the Rehabilitation Physician determined and documented that the patient's condition is appropriate for intensive rehabilitative care in an inpatient rehabilitation facility. Pt's sisters have arranged to provide 24/7 minimum physical assistance at discharge from inpt rehab facility. Will admit to inpatient rehab today.  Preadmission Screen Completed By: Cleatrice Burke, 07/08/2015 9:11 AM ______________________________________________________________________  Discussed status with Dr. Naaman Plummer on 07/08/2015 at [phone] and received telephone approval for admission today.  Admission Coordinator: Cleatrice Burke, time [id number] Date 07/08/2015          Cosigned by: Meredith Staggers, MD at 07/08/2015 10:36 AM  Revision History     Date/Time User Provider Type Action   07/08/2015 10:36 AM Meredith Staggers, MD Physician Cosign   07/08/2015 9:12 AM Cristina Gong, RN Rehab Admission Coordinator Sign

## 2015-07-08 NOTE — Interval H&P Note (Signed)
Gerald [name] was admitted today to Inpatient Rehabilitation with the diagnosis of CVA.  The patient's history has been reviewed, patient examined, and there is no change in status.  Patient continues to be appropriate for intensive inpatient rehabilitation.  I have reviewed the patient's chart and labs.  Questions were answered to the patient's satisfaction. The PAPE has been reviewed and assessment remains appropriate.  Gerald [name] T 07/08/2015, 3:26 PM

## 2015-07-08 NOTE — Progress Notes (Signed)
Report given to [facility] on 4W.

## 2015-07-08 NOTE — PMR Pre-admission (Signed)
PMR Admission Coordinator Pre-Admission Assessment  Patient: Gerald [name] is an 55 y.o., male MRN: [id number] DOB: 06/08/1961 Height: 6' (182.9 cm) Weight: 77.066 kg (169 lb 14.4 oz)              Insurance Information HMO:     PPO:      PCP:      IPA:      80/20:      OTHER:  PRIMARY: UNinsured       Medicaid Application Date:       Case Manager:  Disability Application Date:       Case Worker:  On 07/07/15 I left message for financial counselor, Sharyn Lull at # [phone] to request disability and Medicaid Applications to be initiated.  Emergency Contact Information Contact Information    Name Relation Home Work Mobile   [place] Significant other   [phone]   Abdulsamad, [place]   [phone]   Mathews Argyle Sister   [phone]     Current Medical History  Patient Admitting Diagnosis: Left ACA infarct  History of Present Illness:Gerald [name] is a 55 y.o. 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  Presented 07/02/2015 with right-sided weakness and speech difficulty. Urine drug screen positive cocaine. Blood pressure 200/120. MRI of the brain showed acute nonhemorrhagic left anterior cerebral artery territory infarct affecting the medial and anterior frontal lobes. MRA of the head with no large vessel occlusion. Echocardiogram with ejection fraction of [id number] grade 1 diastolic dysfunction. Carotid Dopplers with no ICA stenosis. Neurology follow-up. Patient did not receive TPA. Advised to continue aspirin as prior to admission with questionable medical compliance. Tolerating a regular consistency diet.   Total: 13 NIH  Past Medical History  Past Medical History  Diagnosis Date  . Hypertension   . Alcohol abuse   . Tobacco use   . Cocaine abuse 2014  . Stroke [facility]) 2014    denies  residual on 07/03/2015  . Stroke [facility]) 07/02/2015    "now weak on right side; speech problems" (07/03/2015)    Family History  family history includes Hypertension in his father and mother; Stroke in his father.  Prior Rehab/Hospitalizations:  Has the patient had major surgery during 100 days prior to admission? No  Current Medications   Current facility-administered medications:  .  amLODipine (NORVASC) tablet 10 mg, 10 mg, Oral, Daily, Ripudeep K Rai, MD .  antiseptic oral rinse (CPC / CETYLPYRIDINIUM CHLORIDE 0.05%) solution 7 mL, 7 mL, Mouth Rinse, q12n4p, Ripudeep K Rai, MD, 7 mL at 07/07/15 1200 .  [DISCONTINUED] aspirin suppository 300 mg, 300 mg, Rectal, Daily, 300 mg at 07/04/15 1031 **OR** aspirin tablet 325 mg, 325 mg, Oral, Daily, Edwin Dada, MD, 325 mg at 07/07/15 0948 .  atorvastatin (LIPITOR) tablet 20 mg, 20 mg, Oral, q1800, Ripudeep K Rai, MD, 20 mg at 07/07/15 1709 .  chlorhexidine (PERIDEX) 0.12 % solution 15 mL, 15 mL, Mouth Rinse, BID, Ripudeep K Rai, MD, 15 mL at 07/07/15 2259 .  enoxaparin (LOVENOX) injection 40 mg, 40 mg, Subcutaneous, Q24H, Donzetta Starch, NP, 40 mg at 07/07/15 1705 .  folic acid (FOLVITE) tablet 1 mg, 1 mg, Oral, Daily, Edwin Dada, MD, 1 mg at 07/07/15 0948 .  hydrALAZINE (APRESOLINE) injection 10 mg, 10 mg, Intravenous, Q6H PRN, Ripudeep K Rai, MD, 10 mg at 07/06/15 2317 .  lisinopril (PRINIVIL,ZESTRIL) tablet 40 mg, 40 mg, Oral, Daily, Ripudeep K Rai, MD .  multivitamin with minerals tablet 1 tablet, 1 tablet, Oral, Daily, Edwin Dada, MD, 1 tablet at 07/07/15 0948 .  senna-docusate (Senokot-S) tablet 1 tablet, 1 tablet, Oral, QHS PRN, Edwin Dada, MD .  thiamine (VITAMIN B-1) tablet 100 mg, 100 mg, Oral, Daily, 100 mg at 07/07/15 0948 **OR** [DISCONTINUED] thiamine (B-1) injection 100 mg, 100 mg, Intravenous, Daily, Edwin Dada, MD, 100 mg at 07/04/15 1031  Patients Current Diet: Diet Heart Room  service appropriate?: Yes; Fluid consistency:: Thin  Precautions / Restrictions Precautions Precautions: Fall Restrictions Weight Bearing Restrictions: No   Has the patient had 2 or more falls or a fall with injury in the past year?No  Prior Activity Level Community (5-7x/wk): worked part time at [employer] 3-4 hrs per day . Does not drive.  Home Assistive Devices / Equipment Home Assistive Devices/Equipment: None Home Equipment: None  Prior Device Use: Indicate devices/aids used by the patient prior to current illness, exacerbation or injury? None of the above  Prior Functional Level Prior Function Level of Independence: Independent  Self Care: Did the patient need help bathing, dressing, using the toilet or eating?  Independent  Indoor Mobility: Did the patient need assistance with walking from room to room (with or without device)? Independent  Stairs: Did the patient need assistance with internal or external stairs (with or without device)? Independent  Functional Cognition: Did the patient need help planning regular tasks such as shopping or remembering to take medications? Independent  Current Functional Level Cognition  Arousal/Alertness: Awake/alert Overall Cognitive Status: Impaired/Different from baseline Difficult to assess due to: Impaired communication Orientation Level: Oriented to person, Disoriented to situation, Oriented to place, Oriented to time Following Commands: Follows one step commands consistently, Follows one step commands with increased time Safety/Judgement: Decreased awareness of safety, Decreased awareness of deficits General Comments: Improved communication today    Extremity Assessment (includes Sensation/Coordination)  Upper Extremity Assessment: RUE deficits/detail RUE Deficits / Details: Brunstrum 0, noted increased tone at elbow (encouraged his wife to help him straighten it back out as she notices it drawing up); currently can get full  ellbow extension with minimal effort; pt does report he feels nailbed pressure on his 2nd digit, he said no to his 5th digit but there was an automatic withdrawl but delayed when nail bed pressure was applied to 5th digit. Demonstrated to pt's wife how to prop his RUE on pillow  Lower Extremity Assessment: RLE deficits/detail RLE Deficits / Details: Noted withdrawl to nail bed pressure (quicker on first digit, slower on 5th digit). Educated pt's wife on how to position pillows against bed rail at pt's knee on pt's right to help position pt's RLE in neutral alignment for hip    ADLs  Overall ADL's : Needs assistance/impaired Eating/Feeding: Set up, Bed level Grooming: Moderate assistance, Bed level Upper Body Bathing: Moderate assistance, Bed level Lower Body Bathing: Maximal assistance, Bed level Upper Body Dressing : Total assistance, Bed level Lower Body Dressing: Total assistance, Bed level    Mobility  Overal bed mobility: Needs Assistance Bed Mobility: Rolling, Sidelying to Sit Rolling: Min assist (to rt with verbal cues) Sidelying to sit: Max assist Supine to sit: Max assist Sit to supine: Mod assist General bed mobility comments: Assist to bring RLE off of bed  and to elevate trunk into sitting    Transfers  Overall transfer level: Needs assistance Equipment used: Ambulation equipment used  Transfer via Lift Equipment: Stedy Transfers: Sit to/from Stand, Risk manager Sit to Stand: +2 physical assistance, Mod assist Stand pivot transfers: +2 physical assistance, Max assist General transfer comment: Assist to bring hips up, support RLE, and correct heavy Rt lateral lean. Used Stedy for pivot with assist to correct Rt lateral lean even when sitting on stedy    Ambulation / Gait / Stairs / Office manager / Balance Dynamic Sitting Balance Sitting balance - Comments: Sat EOB x 6-8 minutes with min to mod A due to heavy rt lateral lean. Pt able to hold  onto foot of bed with LUE to assist with balance. Balance Overall balance assessment: Needs assistance Sitting-balance support: Single extremity supported, Feet supported Sitting balance-Leahy Scale: Poor Sitting balance - Comments: Sat EOB x 6-8 minutes with min to mod A due to heavy rt lateral lean. Pt able to hold onto foot of bed with LUE to assist with balance. Postural control: Right lateral lean Standing balance support: Single extremity supported Standing balance-Leahy Scale: Poor Standing balance comment: Steady and +2 mod A for static standing. Manual support to prevent rt knee buckling. Rt lateral lean.    Special needs/care consideration Skin intact                           Bowel mgmt: incontinent LBM 07/06/15 Bladder mgmt: incontinent. Use of condom catheter Diabetic mgmt Hgb A1c 5.3 Positive cocaine on UDS on admit. Pt admits to pot use and alcohol. Sisters per pt unaware of drug use. Nyoka Cowden, is aware per pt of drug use. Pt noncompliant with HTN meds and ASA follow up since last CVA 2014 due to no PCP or insurance per sisters. Need follow up with financial counselor for disability and Medicaid applications during this admission. Smoker and alcohol use pta   Previous Home Environment Living Arrangements: Spouse/significant other (Has been with [name] for 31 years)  Lives With: Significant other Available Help at Discharge: Family, Available 24 hours/day Type of Home: Apartment Home Layout: One level Home Access: Stairs to enter Entrance Stairs-Rails: Left Entrance Stairs-Number of Steps: 3 Bathroom Shower/Tub: Optometrist: Yes How Accessible: Accessible via walker [place]: No Additional Comments: confirmed with sister home d/c layout  Discharge Living Setting Plans for Discharge Living Setting: Lives with (comment), Other (Comment) (Plans to go live with sister, Mathews Argyle and she and sister) Type  of Home at Discharge: House Discharge Home Layout: One level Discharge Home Access: Stairs to enter Entrance Stairs-Rails: None Entrance Stairs-Number of Steps: 1 step in Discharge Bathroom Shower/Tub: Walk-in shower (with built in seat) Discharge Bathroom Toilet: Standard Discharge Bathroom Accessibility: Yes How Accessible: Accessible via wheelchair, Accessible via walker Does the patient have any problems obtaining your medications?: Yes (Describe) (uninsured. NO PCP or f/u since CVA 2014 due to no insurance) Sister, [name] unemployed. She cared for her spouse until he died last year. Home can have wheelchair or walker accessibility. Nyoka Cowden, works first shift. The plan is for sisters [name] and Dannielle Karvonen as well as Nyoka Cowden, to provide 24/7 physical min assist as needed in [name]'s home.  Social/Family/Support Systems Patient Roles: Partner, Other (Comment) (employed part time at [employer]) [employer] Information: Primary contact is girlfriend, [name] then sister [name] who is will d/c to her home. Has 4 sisters involved Anticipated Caregiver: [name] and sisters [name] and [place] Anticipated Ambulance person Information:  see above Ability/Limitations of Caregiver: [name] works first shift, [name] unemployed 55 yo without any disability Caregiver Availability: 24/7 Discharge Plan Discussed with Primary Caregiver: Yes Is Caregiver In Agreement with Plan?: Yes Does Caregiver/Family have Issues with Lodging/Transportation while Pt is in Rehab?: No  Goals/Additional Needs Patient/Family Goal for Rehab: supervision to min assist with PT, OT and SLP Expected length of stay: [facility]OS 20- 24 days Special Service Needs: Pt positive drugs on admit. STates he does do pot that [name] is aware of but his sisters are not aware of his drug use Pt/Family Agrees to Admission and willing to participate: Yes Program Orientation Provided & Reviewed with Pt/Caregiver Including Roles  & Responsibilities:  Yes Information Needs to be Provided By: needs assist for PCP follow up as well as disability and Medicaid applications  Decrease burden of Care through IP rehab admission: n/a  Possible need for SNF placement upon discharge:not anticipated  Patient Condition: This patient's condition remains as documented in the consult dated 07/08/2015, in which the Rehabilitation Physician determined and documented that the patient's condition is appropriate for intensive rehabilitative care in an inpatient rehabilitation facility. Pt's sisters have arranged to provide 24/7 minimum physical assistance at discharge from inpt rehab facility. Will admit to inpatient rehab today.  Preadmission Screen Completed By:  Cleatrice Burke, 07/08/2015 9:11 AM ______________________________________________________________________   Discussed status with Dr. Naaman Plummer on 07/08/2015 at  [phone] and received telephone approval for admission today.  Admission Coordinator:  Cleatrice Burke, time [id number] Date 07/08/2015

## 2015-07-08 NOTE — Progress Notes (Signed)
Pt transferred to Rehab from [place]. Alert and orientated x 3. Memory deficit noted.Diagnostic specific information provided.  Pt and family orientated to Rehab expectations, therapy schedule, etc. Pt looking forward to starting therapy in the a.m.
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
## 2015-07-08 NOTE — Progress Notes (Signed)
Patient transported to 4W in stable condition with all personal belongings.

## 2015-07-09 ENCOUNTER — Inpatient Hospital Stay (HOSPITAL_COMMUNITY): Payer: Medicaid Other | Admitting: Speech Pathology

## 2015-07-09 ENCOUNTER — Inpatient Hospital Stay (HOSPITAL_COMMUNITY): Payer: Medicaid Other

## 2015-07-09 ENCOUNTER — Inpatient Hospital Stay (HOSPITAL_COMMUNITY): Payer: Medicaid Other | Admitting: Occupational Therapy

## 2015-07-09 DIAGNOSIS — I69322 Dysarthria following cerebral infarction: Secondary | ICD-10-CM | POA: Insufficient documentation

## 2015-07-09 DIAGNOSIS — I63522 Cerebral infarction due to unspecified occlusion or stenosis of left anterior cerebral artery: Secondary | ICD-10-CM

## 2015-07-09 DIAGNOSIS — I69922 Dysarthria following unspecified cerebrovascular disease: Secondary | ICD-10-CM

## 2015-07-09 LAB — CBC WITH DIFFERENTIAL/PLATELET
Basophils Absolute: 0 10*3/uL (ref 0.0–0.1)
Basophils Relative: 1 %
Eosinophils Absolute: 0.3 10*3/uL (ref 0.0–0.7)
Eosinophils Relative: 5 %
HCT: 43.3 % (ref 39.0–52.0)
Hemoglobin: 14.2 g/dL (ref 13.0–17.0)
Lymphocytes Relative: 33 %
Lymphs Abs: 2.2 10*3/uL (ref 0.7–4.0)
MCH: 31.6 pg (ref 26.0–34.0)
MCHC: 32.8 g/dL (ref 30.0–36.0)
MCV: 96.4 fL (ref 78.0–100.0)
Monocytes Absolute: 0.6 10*3/uL (ref 0.1–1.0)
Monocytes Relative: 9 %
Neutro Abs: 3.5 10*3/uL (ref 1.7–7.7)
Neutrophils Relative %: 52 %
Platelets: 302 10*3/uL (ref 150–400)
RBC: 4.49 MIL/uL (ref 4.22–5.81)
RDW: 12.7 % (ref 11.5–15.5)
WBC: 6.6 10*3/uL (ref 4.0–10.5)

## 2015-07-09 LAB — COMPREHENSIVE METABOLIC PANEL
ALT: 37 U/L (ref 17–63)
AST: 32 U/L (ref 15–41)
Albumin: 3.3 g/dL — ABNORMAL LOW (ref 3.5–5.0)
Alkaline Phosphatase: 53 U/L (ref 38–126)
Anion gap: 14 (ref 5–15)
BUN: 12 mg/dL (ref 6–20)
CO2: 24 mmol/L (ref 22–32)
Calcium: 8.9 mg/dL (ref 8.9–10.3)
Chloride: 103 mmol/L (ref 101–111)
Creatinine, Ser: 0.98 mg/dL (ref 0.61–1.24)
GFR calc Af Amer: >60 mL/min (ref >60)
GFR calc non Af Amer: >60 mL/min (ref >60)
Glucose, Bld: 110 mg/dL — ABNORMAL HIGH (ref 65–99)
Potassium: 4 mmol/L (ref 3.5–5.1)
Sodium: 141 mmol/L (ref 135–145)
Total Bilirubin: 0.9 mg/dL (ref 0.3–1.2)
Total Protein: 6.8 g/dL (ref 6.5–8.1)

## 2015-07-09 MED ORDER — SIMETHICONE 80 MG PO CHEW
40.0000 mg | CHEWABLE_TABLET | ORAL | Status: DC | PRN
  Administered 2015-07-13 – 2015-07-28 (×2): 80 mg via ORAL
  Filled 2015-07-09 (×3): qty 1

## 2015-07-09 MED ORDER — SIMETHICONE 40 MG/0.6ML PO SUSP: 40.0000 mg | ORAL | Status: DC | PRN

## 2015-07-09 NOTE — Progress Notes (Signed)
Patient information reviewed and entered into eRehab system by Logann Whitebread, RN, CRRN, PPS Coordinator.  Information including medical coding and functional independence measure will be reviewed and updated through discharge.    

## 2015-07-09 NOTE — Care Management Note (Signed)
[facility] Individual Statement of Services  Patient Name:  Gerald [name]  Date:  07/09/2015  Welcome to the [facility].  Our goal is to provide you with an individualized program based on your diagnosis and situation, designed to meet your specific needs.  With this comprehensive rehabilitation program, you will be expected to participate in at least 3 hours of rehabilitation therapies Monday-Friday, with modified therapy programming on the weekends.  Your rehabilitation program will include the following services:  Physical Therapy (PT), Occupational Therapy (OT), Speech Therapy (ST), 24 hour per day rehabilitation nursing, Therapeutic Recreaction (TR), Neuropsychology, Case Management (Social Worker), Rehabilitation Medicine, Nutrition Services and Pharmacy Services  Weekly team conferences will be held on Wednesday to discuss your progress.  Your Social Worker will talk with you frequently to get your input and to update you on team discussions.  Team conferences with you and your family in attendance may also be held.  Expected length of stay: 21-23 days  Overall anticipated outcome: min level  Depending on your progress and recovery, your program may change. Your Social Worker will coordinate services and will keep you informed of any changes. Your Social Worker's name and contact numbers are listed  below.  The following services may also be recommended but are not provided by the [facility]:    [place] will be made to provide these services after discharge if needed.  Arrangements include referral to agencies that provide these services.  Your insurance has been verified to be:  None-applying for Medicaid Your primary doctor is:  None  Pertinent information will be shared with your doctor and your insurance  company.  Social Worker:  Ovidio Kin, [place] or ([phone]  Information discussed with and copy given to patient by: [name], 07/09/2015, 10:18 AM

## 2015-07-09 NOTE — Progress Notes (Signed)
Occupational Therapy Note  Patient Details  Name: Gerald [name] MRN: [id number] Date of Birth: January 28, 1961  Today's Date: 07/09/2015 OT Individual Time: 1400-1430 OT Individual Time Calculation (min): 30 min   Pt denied pain Individual therapy  Pt resting in w/c upon arrival.  Pt unable to verbalize reason for being in hospital (expressive aphasia?) but stated yes when asked if he had a stroke.  Pt initially able to flex and extend fingers in right hand.  Pt also noted with considerable tone in shoulder/scapula and unable to elbow or shoulder flexion. Pt engaged in oral care seated in w/c at sink.  Pt required setup assistance.  Pt required max A and max verbal cues for squat pivot transfers w/c<>BSC.  Pt noted with considerable lean to right during transfers.    Leotis Shames [facility] 07/09/2015, 2:45 PM

## 2015-07-09 NOTE — Progress Notes (Signed)
55 y.o. 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 By report patient lives with significant other and independent prior to admission but does not drive. One level home with 3 steps to entry. Plans to discharge to home with sister and assistance is needed. Presented 07/02/2015 with right-sided weakness and speech difficulty. Urine drug screen positive cocaine. Blood pressure 200/120. MRI of the brain showed acute nonhemorrhagic left anterior cerebral artery territory infarct affecting the medial and anterior frontal lobes. MRA of the head with no large vessel occlusion. Echocardiogram with ejection fraction of 54% grade 1 diastolic dysfunction. Carotid Dopplers with no ICA stenosis  Subjective/Complaints: Pt feels numb on right side, states he had full recovery from prior R ACA CVA Discussed BP effect of cocaine and stroke risk  ROS- denies CP/SOB, N/V/D  Objective: Vital Signs: Blood pressure 132/83, pulse 79, temperature 97.8 F (36.6 C), temperature source Oral, resp. rate 18, height 6' (1.829 m), weight 77.61 kg (171 lb 1.6 oz), SpO2 93 %. No results found. Results for orders placed or performed during the hospital encounter of 07/08/15 (from the past 72 hour(s))  CBC     Status: None   Collection Time: 07/08/15  6:27 PM  Result Value Ref Range   WBC 8.1 4.0 - 10.5 K/uL   RBC 4.63 4.22 - 5.81 MIL/uL   Hemoglobin 14.6 13.0 - 17.0 g/dL   HCT 44.8 39.0 - 52.0 %   MCV 96.8 78.0 - 100.0 fL   MCH 31.5 26.0 - 34.0 pg   MCHC 32.6 30.0 - 36.0 g/dL   RDW 12.8 11.5 - 15.5 %   Platelets 295 150 - 400 K/uL  Creatinine, serum     Status: None   Collection Time: 07/08/15  6:27 PM  Result Value Ref Range   Creatinine, Ser 0.98 0.61 - 1.24 mg/dL   GFR calc non Af Amer >60 >60 mL/min   GFR calc Af Amer >60  >60 mL/min    Comment: (NOTE) The eGFR has been calculated using the CKD EPI equation. This calculation has not been validated in all clinical situations. eGFR's persistently <60 mL/min signify possible Chronic Kidney Disease.   CBC WITH DIFFERENTIAL     Status: None   Collection Time: 07/09/15  5:33 AM  Result Value Ref Range   WBC 6.6 4.0 - 10.5 K/uL   RBC 4.49 4.22 - 5.81 MIL/uL   Hemoglobin 14.2 13.0 - 17.0 g/dL   HCT 43.3 39.0 - 52.0 %   MCV 96.4 78.0 - 100.0 fL   MCH 31.6 26.0 - 34.0 pg   MCHC 32.8 30.0 - 36.0 g/dL   RDW 12.7 11.5 - 15.5 %   Platelets 302 150 - 400 K/uL   Neutrophils Relative % 52 %   Neutro Abs 3.5 1.7 - 7.7 K/uL   Lymphocytes Relative 33 %   Lymphs Abs 2.2 0.7 - 4.0 K/uL   Monocytes Relative 9 %   Monocytes Absolute 0.6 0.1 - 1.0 K/uL   Eosinophils Relative 5 %   Eosinophils Absolute 0.3 0.0 - 0.7 K/uL   Basophils Relative 1 %   Basophils Absolute 0.0 0.0 - 0.1 K/uL  Comprehensive metabolic panel     Status: Abnormal   Collection Time: 07/09/15  5:33 AM  Result Value Ref Range   Sodium 141 135 - 145 mmol/L   Potassium  4.0 3.5 - 5.1 mmol/L   Chloride 103 101 - 111 mmol/L   CO2 24 22 - 32 mmol/L   Glucose, Bld 110 (H) 65 - 99 mg/dL   BUN 12 6 - 20 mg/dL   Creatinine, Ser 0.98 0.61 - 1.24 mg/dL   Calcium 8.9 8.9 - 10.3 mg/dL   Total Protein 6.8 6.5 - 8.1 g/dL   Albumin 3.3 (L) 3.5 - 5.0 g/dL   AST 32 15 - 41 U/L   ALT 37 17 - 63 U/L   Alkaline Phosphatase 53 38 - 126 U/L   Total Bilirubin 0.9 0.3 - 1.2 mg/dL   GFR calc non Af Amer >60 >60 mL/min   GFR calc Af Amer >60 >60 mL/min    Comment: (NOTE) The eGFR has been calculated using the CKD EPI equation. This calculation has not been validated in all clinical situations. eGFR's persistently <60 mL/min signify possible Chronic Kidney Disease.    Anion gap 14 5 - 15     HEENT: normal and poor dentition Cardio: RRR and no murmur Resp: RRR and no murmur GI: BS positive and NT,  ND Extremity:  Pulses positive and No Edema Skin:   Intact Neuro: Confused, Flat, Abnormal Sensory absent LT in RUE and RLE, intact on left side, Abnormal Motor 2- R arm ext, 0/5 grip, 0/5 finger and wrist ext, trace R hip ext, Dysarthric and Other naming intact but decreased fluency Musc/Skel:  Other no pain with UE or LE ROM Gen NAD   Assessment/Plan: 1. Functional deficits secondary to Left ACA infarct with Right HP which require 3+ hours per day of interdisciplinary therapy in a comprehensive inpatient rehab setting. Physiatrist is providing close team supervision and 24 hour management of active medical problems listed below. Physiatrist and rehab team continue to assess barriers to discharge/monitor patient progress toward functional and medical goals. FIM:       Function - Toileting Toileting steps completed by helper: Adjust clothing prior to toileting, Performs perineal hygiene, Adjust clothing after toileting  Function - Toilet Transfers Toilet transfer assistive device: Bedside commode Assist level to toilet: Moderate assist (Pt 50 - 74%/lift or lower) Assist level from toilet: Moderate assist (Pt 50 - 74%/lift or lower) Assist level to bedside commode (at bedside): Moderate assist (Pt 50 - 74%/lift or lower) Assist level from bedside commode (at bedside): Moderate assist (Pt 50 - 74%/lift or lower)        Function - Comprehension Comprehension: Auditory Comprehension assist level: Understands basic 50 - 74% of the time/ requires cueing 25 - 49% of the time  Function - Expression Expression: Verbal Expression assist level: Expresses basic 50 - 74% of the time/requires cueing 25 - 49% of the time. Needs to repeat parts of sentences.  Function - Social Interaction Social Interaction assist level: Interacts appropriately 75 - 89% of the time - Needs redirection for appropriate language or to initiate interaction.  Function - Problem Solving Problem solving assist  level: Solves basic 50 - 74% of the time/requires cueing 25 - 49% of the time  Function - Memory Memory assist level: Recognizes or recalls 50 - 74% of the time/requires cueing 25 - 49% of the time Patient normally able to recall (first 3 days only): That he or she is in a hospital, Current season  Medical Problem List and Plan: 1.  Right hemiparesis, Right hemisensory deficits and dysarthria secondary to left ACA territory infarct-intitiate rehab program  2.  DVT Prophylaxis/Anticoagulation: Subcutaneous Lovenox. Monitor platelet counts and any signs of bleeding 3. Pain Management: Tylenol as needed. Pain controlled at present 4. Hypertension. Norvasc 5 mg daily, lisinopril 20 mg daily. 132/83 Monitor with increased mobility 5. Neuropsych: This patient is capable of making decisions on his own behalf. 6. Skin/Wound Care: Routine skin checks. OOB. Encourage appropriate nutrition 7. Fluids/Electrolytes/Nutrition: Routine I&O's with follow-up chemistries in AM 8. Hyperlipidemia. Lipitor 9. Tobacco/ cocaine/alcohol abuse. Provide counseling during rehab stay- discussed effect of cocaine on BP and recurrent stroke risk   LOS (Days) 1 A FACE TO FACE EVALUATION WAS PERFORMED  Toy Eisemann E 07/09/2015, 8:01 AM
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
## 2015-07-10 ENCOUNTER — Inpatient Hospital Stay (HOSPITAL_COMMUNITY): Payer: Medicaid Other

## 2015-07-10 ENCOUNTER — Inpatient Hospital Stay (HOSPITAL_COMMUNITY): Payer: Medicaid Other | Admitting: Speech Pathology

## 2015-07-10 ENCOUNTER — Inpatient Hospital Stay (HOSPITAL_COMMUNITY): Payer: Medicaid Other | Admitting: Occupational Therapy

## 2015-07-10 NOTE — Progress Notes (Signed)
Speech Language Pathology Daily Session Note  Patient Details  Name: Gerald [name] MRN: [id number] Date of Birth: [birth year]  Today's Date: 07/10/2015 SLP Individual Time: 1300-1400 SLP Individual Time Calculation (min): 60 min  Short Term Goals: Week 1: SLP Short Term Goal 1 (Week 1): Pt will demonstrate complete naming tasks with 80% acc min A.  SLP Short Term Goal 2 (Week 1): Pt will demonstrate sentence comprehension with 80% acc with min A. SLP Short Term Goal 3 (Week 1): Pt will follow 3-step or complex directives with 75% acc min A. SLP Short Term Goal 4 (Week 1): Pt will communicate basic needs/wants via any means with min A. SLP Short Term Goal 5 (Week 1): Pt will intiate verbal responses with min visual/verbal cueing with 75% acc. SLP Short Term Goal 6 (Week 1): Pt will demonstrate recogition of 3-5 objects given a 5 min delay with min A.  Skilled Therapeutic Interventions: Pt was an excellent participant in skilled speech therapy this date to address cognitive-linguistic limitations. Of note- pt was able to clarify this date that after his previous stroke, he had some mild residual speech impairment. Will verify with family. Educated pt re: the term "aphasia." Object labeling 20/20 items at mod I for increased time and self-correction. Pt able to provide use items 18/20 with leading phrase such as, "I use a cup to_______." Object recognition after 5 minute delay was 4/4 (in a field of 7). Same task was completed with pictures and pt performed 4/4 (in a field of 8). Reading : 10/10 with comprehension at the word level. Pt required mod A of limiting visual field to successfully read phrases; however comprehension of phrase was intact. Matching word to pictures in a field of 5 was completed accurately with min verbal cueing for initiation and occasional repetition of directions. 2-step directives were completed 3/3. 3-step directives required mod-max A of repetition, modeling and breakdown  to complete. Gains already noticed this date. Pt was encouraged by progress.   Function:   Cognition Comprehension Comprehension assist level: Understands basic 75 - 89% of the time/ requires cueing 10 - 24% of the time  Expression   Expression assist level: Expresses basic 25 - 49% of the time/requires cueing 50 - 75% of the time. Uses single words/gestures.  Social Interaction Social Interaction assist level: Interacts appropriately 90% of the time - Needs monitoring or encouragement for participation or interaction.  Problem Solving Problem solving assist level: Solves basic 25 - 49% of the time - needs direction more than half the time to initiate, plan or complete simple activities  Memory Memory assist level: Recognizes or recalls 50 - 74% of the time/requires cueing 25 - 49% of the time    Pain Pain Assessment Pain Assessment: No/denies pain  Therapy/Group: Individual Therapy  Vinetta Bergamo 07/10/2015, 3:10 PM

## 2015-07-10 NOTE — IPOC Note (Signed)
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) Active Problems:   Essential hypertension   Alcohol abuse   Right hemiparesis (HCC)   Acute left ACA ischemic stroke (HCC)   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     Functional Problem List: Nursing Pain, Skin Integrity  PT Balance, Behavior, Endurance, Motor, Safety, Sensory  OT Balance, Cognition, Endurance, Motor, Safety, Perception, Sensory  SLP Cognition, Linguistic, Motor  TR         Basic ADL's: OT Eating, Grooming, Bathing, Dressing, Toileting     Advanced  ADL's: OT       Transfers: PT Bed Mobility, Bed to Chair, Car, Manufacturing systems engineer, Metallurgist: PT Ambulation, Emergency planning/management officer, Stairs     Additional Impairments: OT Fuctional Use of Upper Extremity  SLP Communication comprehension, expression Attention, Memory, Awareness  TR      Anticipated Outcomes Item Anticipated Outcome  Self Feeding modified independence  Swallowing      Basic self-care  min assist level  Toileting  min assist level   Bathroom Transfers min assist level  Bowel/Bladder  Mod I  Transfers  supervision; min car  Locomotion  w/c supervision x 150'; gait and stairs TBD  Communication  supervision to min A for functional communication   Cognition  min A for functional tasks  Pain  <3  Safety/Judgment  Supervision   Therapy Plan: PT Intensity: Minimum of 1-2 x/day ,45 to 90 minutes PT Frequency: 5 out of 7 days PT Duration Estimated Length of Stay: 21-23 OT Intensity: Minimum of 1-2 x/day, 45 to 90 minutes OT Frequency: 5 out of 7 days OT Duration/Estimated Length of Stay: 21-23 days SLP Intensity: Minumum of 1-2 x/day, 30 to 90 minutes SLP Frequency: 3 to 5 out of 7 days SLP Duration/Estimated Length  of Stay: 21 days       Team Interventions: Nursing Interventions Disease Management/Prevention, Psychosocial Support  PT interventions Ambulation/gait training, Training and development officer, Cognitive remediation/compensation, Community reintegration, Discharge planning, Neuromuscular re-education, Functional mobility training, Functional electrical stimulation, DME/adaptive equipment instruction, Pain management, Patient/family education, Psychosocial support, Splinting/orthotics, UE/LE Coordination activities, UE/LE Strength taining/ROM, Therapeutic Exercise, Therapeutic Activities, Stair training, Wheelchair propulsion/positioning  OT Interventions Training and development officer, Cognitive remediation/compensation, Community reintegration, Discharge planning, Functional mobility training, Functional electrical stimulation, DME/adaptive equipment instruction, Disease mangement/prevention, Neuromuscular re-education, Self Care/advanced ADL retraining, Therapeutic Exercise, UE/LE Strength taining/ROM, Patient/family education, Pain management, Splinting/orthotics, UE/LE Coordination activities, Therapeutic Activities, Psychosocial support, Visual/perceptual remediation/compensation, Wheelchair propulsion/positioning  SLP Interventions Cognitive remediation/compensation, Multimodal communication approach, Speech/Language facilitation, Functional tasks, Therapeutic Activities, Therapeutic Exercise, Patient/family education, Medication managment, Environmental controls, Cueing hierarchy  TR Interventions    SW/CM Interventions Discharge Planning, Psychosocial Support, Patient/Family Education    Team Discharge Planning: Destination: PT-Home ,OT-   , SLP-Home Projected Follow-up: PT-Home health PT, OT-  Home health OT, 24 hour supervision/assistance, SLP-Outpatient SLP, 24 hour supervision/assistance Projected Equipment Needs: PT-To be determined, OT- 3 in 1 bedside comode, Tub/shower bench, Wheelchair  (measurements), Wheelchair cushion (measurements), SLP-None recommended by SLP Equipment Details: PT- , OT-  Patient/family involved in discharge planning: PT- Patient,  OT-Patient, SLP-Patient  MD ELOS: 18-21 Medical Rehab Prognosis:  Good Assessment: 55 y.o. right handed male with history of hypertension, tobacco/alcohol and cocaine abuse, right periventricular white matter of the right frontal lobe CVA 2014 with little residual and maintained  on aspirin with questionable medical compliance. By report patient lives with significant other and independent prior to admission but does not drive. One level home with 3 steps to entry. Plans to discharge to home with sister and assistance is needed. Presented 07/02/2015 with right-sided weakness and speech difficulty. Urine drug screen positive cocaine. Blood pressure 200/120. MRI of the brain showed acute nonhemorrhagic left anterior cerebral artery territory infarct affecting the medial and anterior frontal lobes. MRA of the head with no large vessel occlusion. Echocardiogram with ejection fraction of [id number] grade 1 diastolic dysfunction   Now requiring 24/7 Rehab RN,MD, as well as CIR level PT, OT and SLP.  Treatment team will focus on ADLs and mobility with goals set at [facility]  See Team Conference Notes for weekly updates to the plan of care

## 2015-07-10 NOTE — Progress Notes (Signed)
Physical Therapy Session Note  Patient Details  Name: Gerald [name] MRN: [id number] Date of Birth: [birth year]  Today's Date: 07/10/2015 PT Individual Time: 0800-0900 PT Individual Time Calculation (min): 60 min   Short Term Goals: Week 1:  PT Short Term Goal 1 (Week 1): pt will transfer consistently with assist of 1 PT Short Term Goal 2 (Week 1): pt will propel w/c x 50' with supervision PT Short Term Goal 3 (Week 1): pt will perform sit>stand with asssit of 1  Skilled Therapeutic Interventions/Progress Updates:    neuromuscular re-education : PROM RLE, very hypertonic this AM.  No active motors noted RLE. Tone in [place] reduced compared to yesterday.  Bed mobility to sit EOB and balance in sitting to eat breakfast.  After PT cut up sausage, pt able to feed himself, with cues for small bites and slowing down. Sitting balance with mod/max assist at least 50% of time as pt listed to R.  With cues, able to return to midline. Pt dizzy upon sitting up, but passed within a couple of minutes.  Slide board transfer bed> w/c +2 assist due to pt's motor planning problems with unaffected side, and balance deficits.  neuromuscular re-education via forced use, visual feedback for midline orientation, wt bearing RLE in static stander, x 4 minutes.  Pt c/o dizziness.  See vitals.  RN notified.  Pt left resting in w/c with quick release belt on, and all needs within reach. Therapy Documentation Precautions:  Precautions Precautions: Fall Precaution Comments: dense right hemiparesis Restrictions Weight Bearing Restrictions: No   Vital Signs: Therapy Vitals Pulse Rate: 115 BP: 192/105 mmHg Patient Position (if appropriate): standing Pain: Pain Assessment Pain Assessment: No/denies pain          See Function Navigator for Current Functional Status.   Therapy/Group: Individual Therapy  Jonay Hitchcock 07/10/2015, 9:00 AM

## 2015-07-10 NOTE — Progress Notes (Signed)
Subjective/Complaints: No c/os today, speech remains dysarthric  ROS- denies CP/SOB, N/V/D  Objective: Vital Signs: Blood pressure 131/77, pulse 69, temperature 98.3 F (36.8 C), temperature source Oral, resp. rate 18, height 6' (1.829 m), weight 77.61 kg (171 lb 1.6 oz), SpO2 96 %. No results found. Results for orders placed or performed during the hospital encounter of 07/08/15 (from the past 72 hour(s))  CBC     Status: None   Collection Time: 07/08/15  6:27 PM  Result Value Ref Range   WBC 8.1 4.0 - 10.5 K/uL   RBC 4.63 4.22 - 5.81 MIL/uL   Hemoglobin 14.6 13.0 - 17.0 g/dL   HCT 44.8 39.0 - 52.0 %   MCV 96.8 78.0 - 100.0 fL   MCH 31.5 26.0 - 34.0 pg   MCHC 32.6 30.0 - 36.0 g/dL   RDW 12.8 11.5 - 15.5 %   Platelets 295 150 - 400 K/uL  Creatinine, serum     Status: None   Collection Time: 07/08/15  6:27 PM  Result Value Ref Range   Creatinine, Ser 0.98 0.61 - 1.24 mg/dL   GFR calc non Af Amer >60 >60 mL/min   GFR calc Af Amer >60 >60 mL/min    Comment: (NOTE) The eGFR has been calculated using the CKD EPI equation. This calculation has not been validated in all clinical situations. eGFR's persistently <60 mL/min signify possible Chronic Kidney Disease.   CBC WITH DIFFERENTIAL     Status: None   Collection Time: 07/09/15  5:33 AM  Result Value Ref Range   WBC 6.6 4.0 - 10.5 K/uL   RBC 4.49 4.22 - 5.81 MIL/uL   Hemoglobin 14.2 13.0 - 17.0 g/dL   HCT 43.3 39.0 - 52.0 %   MCV 96.4 78.0 - 100.0 fL   MCH 31.6 26.0 - 34.0 pg   MCHC 32.8 30.0 - 36.0 g/dL   RDW 12.7 11.5 - 15.5 %   Platelets 302 150 - 400 K/uL   Neutrophils Relative % 52 %   Neutro Abs 3.5 1.7 - 7.7 K/uL   Lymphocytes Relative 33 %   Lymphs Abs 2.2 0.7 - 4.0 K/uL   Monocytes Relative 9 %   Monocytes Absolute 0.6 0.1 - 1.0 K/uL   Eosinophils Relative 5 %   Eosinophils Absolute 0.3 0.0 - 0.7 K/uL   Basophils Relative 1 %   Basophils Absolute 0.0 0.0 - 0.1 K/uL  Comprehensive metabolic panel      Status: Abnormal   Collection Time: 07/09/15  5:33 AM  Result Value Ref Range   Sodium 141 135 - 145 mmol/L   Potassium 4.0 3.5 - 5.1 mmol/L   Chloride 103 101 - 111 mmol/L   CO2 24 22 - 32 mmol/L   Glucose, Bld 110 (H) 65 - 99 mg/dL   BUN 12 6 - 20 mg/dL   Creatinine, Ser 0.98 0.61 - 1.24 mg/dL   Calcium 8.9 8.9 - 10.3 mg/dL   Total Protein 6.8 6.5 - 8.1 g/dL   Albumin 3.3 (L) 3.5 - 5.0 g/dL   AST 32 15 - 41 U/L   ALT 37 17 - 63 U/L   Alkaline Phosphatase 53 38 - 126 U/L   Total Bilirubin 0.9 0.3 - 1.2 mg/dL   GFR calc non Af Amer >60 >60 mL/min   GFR calc Af Amer >60 >60 mL/min    Comment: (NOTE) The eGFR has been calculated using the CKD EPI equation. This calculation has not been validated in  all clinical situations. eGFR's persistently <60 mL/min signify possible Chronic Kidney Disease.    Anion gap 14 5 - 15     HEENT: normal and poor dentition Cardio: RRR and no murmur Resp: RRR and no murmur GI: BS positive and NT, ND Extremity:  Pulses positive and No Edema Skin:   Intact Neuro: Confused, Flat, Abnormal Sensory absent LT in RUE and RLE, intact on left side, Abnormal Motor 2- R arm ext, 0/5 grip, 0/5 finger and wrist ext, trace R hip ext, Dysarthric and Other naming intact but decreased fluency Musc/Skel:  Other no pain with UE or LE ROM Gen NAD   Assessment/Plan: 1. Functional deficits secondary to Left ACA infarct with Right HP which require 3+ hours per day of interdisciplinary therapy in a comprehensive inpatient rehab setting. Physiatrist is providing close team supervision and 24 hour management of active medical problems listed below. Physiatrist and rehab team continue to assess barriers to discharge/monitor patient progress toward functional and medical goals. FIM: Function - Bathing Position: Wheelchair/chair at sink Body parts bathed by patient: Right arm, Chest, Abdomen, Front perineal area, Right upper leg, Left upper leg Body parts bathed by  helper: Back, Left lower leg, Right lower leg, Buttocks, Left arm Assist Level: 2 helpers  Function- Upper Body Dressing/Undressing What is the patient wearing?: Pull over shirt/dress Pull over shirt/dress - Perfomed by patient: Thread/unthread left sleeve, Put head through opening Pull over shirt/dress - Perfomed by helper: Pull shirt over trunk, Thread/unthread right sleeve Function - Lower Body Dressing/Undressing What is the patient wearing?: Pants, Socks Position: Wheelchair/chair at sink Pants- Performed by patient: Thread/unthread left pants leg Pants- Performed by helper: Pull pants up/down, Thread/unthread right pants leg Socks - Performed by patient: Don/doff right sock, Don/doff left sock Assist for lower body dressing: 2 Helpers  Function - Toileting Toileting steps completed by helper: Adjust clothing prior to toileting, Performs perineal hygiene, Adjust clothing after toileting  Function - Toilet Transfers Toilet transfer assistive device: Bedside commode Assist level to toilet: Moderate assist (Pt 50 - 74%/lift or lower) Assist level from toilet: Moderate assist (Pt 50 - 74%/lift or lower) Assist level to bedside commode (at bedside): Maximal assist (Pt 25 - 49%/lift and lower) Assist level from bedside commode (at bedside): Maximal assist (Pt 25 - 49%/lift and lower)  Function - Chair/bed transfer Chair/bed transfer method: Squat pivot Chair/bed transfer assist level: 2 helpers Chair/bed transfer details: Verbal cues for technique, Manual facilitation for weight shifting, Manual facilitation for placement, Manual facilitation for weight bearing  Function - Locomotion: Wheelchair Will patient use wheelchair at discharge?: Yes Type: Manual Max wheelchair distance: 25 Assist Level: Maximal assistance (Pt 25 - 49%) Wheel 50 feet with 2 turns activity did not occur: Safety/medical concerns (fatigue) Wheel 150 feet activity did not occur: Safety/medical concerns  (fatigue) Turns around,maneuvers to table,bed, and toilet,negotiates 3% grade,maneuvers on rugs and over doorsills: No Function - Locomotion: Ambulation Ambulation activity did not occur: Safety/medical concerns (lack of sensation RLE.) Walk 10 feet activity did not occur: Safety/medical concerns Walk 50 feet with 2 turns activity did not occur: Safety/medical concerns Walk 150 feet activity did not occur: Safety/medical concerns Walk 10 feet on uneven surfaces activity did not occur: Safety/medical concerns  Function - Comprehension Comprehension: Auditory Comprehension assist level: Understands basic 50 - 74% of the time/ requires cueing 25 - 49% of the time  Function - Expression Expression: Verbal Expression assist level: Expresses basic 25 - 49% of the time/requires cueing 50 -  75% of the time. Uses single words/gestures.  Function - Social Interaction Social Interaction assist level: Interacts appropriately 75 - 89% of the time - Needs redirection for appropriate language or to initiate interaction.  Function - Problem Solving Problem solving assist level: Solves basic 25 - 49% of the time - needs direction more than half the time to initiate, plan or complete simple activities  Function - Memory Memory assist level: Recognizes or recalls 25 - 49% of the time/requires cueing 50 - 75% of the time Patient normally able to recall (first 3 days only): Current season  Medical Problem List and Plan: 1.  Right hemiparesis, Right hemisensory deficits and dysarthria secondary to left ACA territory infarct-expect UE>LE recovery                                   2.  DVT Prophylaxis/Anticoagulation: Subcutaneous Lovenox. Monitor platelet counts and any signs of bleeding, plt 302K 3. Pain Management: Tylenol as needed. Pain controlled at present 4. Hypertension. Norvasc 5 mg daily, lisinopril 20 mg daily. 131/77 Monitor with increased mobility 5. Neuropsych: This patient is capable of making  decisions on his own behalf. 6. Skin/Wound Care: Routine skin checks. OOB. Encourage appropriate nutrition 7. Fluids/Electrolytes/Nutrition: Routine I&O's with follow-up chemistries in AM 8. Hyperlipidemia. Lipitor 9. Tobacco/ cocaine/alcohol abuse. Provide counseling during rehab stay- discussed effect of cocaine on BP and recurrent stroke risk   LOS (Days) 2 A FACE TO FACE EVALUATION WAS PERFORMED  Marthella Osorno E 07/10/2015, 7:14 AM

## 2015-07-10 NOTE — Progress Notes (Signed)
Occupational Therapy Session Note  Patient Details  Name: Gerald [name] MRN: [id number] Date of Birth: 06-21-1960  Today's Date: 07/10/2015 OT Individual Time: 1540-1610 OT Individual Time Calculation (min): 30 min    Short Term Goals: Week 1:  OT Short Term Goal 1 (Week 1): Pt will maintain dynamic sitting balance during bathing tasks with mod assist. OT Short Term Goal 2 (Week 1): Pt will complete UB dressing in supported sitting with supervision. OT Short Term Goal 3 (Week 1): Pt will complete LB dressing with mod assist sit to stand. OT Short Term Goal 4 (Week 1): Pt will use the RUE as a stabilizer with mod assist during grooming and bathing tasks.    Skilled Therapeutic Interventions/Progress Updates: Patient seated in w/c upon arrival and then participated in skilled OT as follows:   Trunk strength and stabillity (while seated in w/c unsupported by back of chair and with arm rest moved out of supportive way).   Patient required mod A to flex trunk forward & exhibited weakness in front and back right trunk.    As well, patient c/o great sharp pain in mid to lower right back when he flexed trunk forward (but not when he flexed diagonally to his left or right).   This region on his back was very toneous and tight.   RN in room and this clinician shared this info with patient's nurse.  Nurse stated he would further assess, give heat pack and stated that maybe a muscle relaxant would help.  As well, patient was educated on self range of motion (self ROM) exercises for cradling right arm onto left & support right elbow to complete (with arms cradled/folded) shoulder flexion and shoulder abduction.   Pt c/o pain in right shoulder with flexed great than 0-45 degrees or when he internally rotated right shoulder.   He was advised to only complete this exercise within pain tolerance level.       As long as right shoulder is not subluxed, patient would benefit from repetitive education to remember  correct technique and increase ROM.  Patient was left seated in w/c with w/c belt and call bell in place at end of session.     Therapy Documentation Precautions:  Precautions Precautions: Fall Precaution Comments: dense right hemiparesis Restrictions Weight Bearing Restrictions: No  Pain: sharp pain in mid to lower right back just lateral to his spine - area felt very toneous and tight to this clinician's touch.  Nurse informed and said he would give medications and assess need for muscle relaxant          Otherwise, he complained on pain in right shoulder when flexed past 45 degrees and with internal rotation during self Range of Motion exercises.   See Function Navigator for Current Functional Status.   Therapy/Group: Individual Therapy  Alfredia Ferguson [facility] 07/10/2015, 5:59 PM

## 2015-07-10 NOTE — Progress Notes (Signed)
Occupational Therapy Session Note  Patient Details  Name: Gerald [name] MRN: [id number] Date of Birth: 11/22/1960  Today's Date: 07/10/2015 OT Individual Time: [id number] OT Individual Time Calculation (min): 58 min    Short Term Goals: Week 1:  OT Short Term Goal 1 (Week 1): Pt will maintain dynamic sitting balance during bathing tasks with mod assist. OT Short Term Goal 2 (Week 1): Pt will complete UB dressing in supported sitting with supervision. OT Short Term Goal 3 (Week 1): Pt will complete LB dressing with mod assist sit to stand. OT Short Term Goal 4 (Week 1): Pt will use the RUE as a stabilizer with mod assist during grooming and bathing tasks.    Skilled Therapeutic Interventions/Progress Updates:    Pt completed shower and dressing during session.  Total assist for stand pivot transfers to the tub bench for shower.  Pt with decreased ability to advance the RLE during the transfers.  In sitting pt with decreased ability to maintain unsupported sitting during shower secondary to motor apraxia and coordination of his abdominal musculature.  Needs use of LUE to come forward out of the back of the shower seat to work on washing his lower legs or to position himself for sit to stand or transfers.  Max hand over hand assist needed to integrate the RUE into bathing.  Max assist needed for UB dressing with total assist for LB sit to stand.  Max instructional cueing for hemidressing techniques.  Pt left in wheelchair with arm trough in place, safety belt, and call button within reach.    Therapy Documentation Precautions:  Precautions Precautions: Fall Precaution Comments: dense right hemiparesis Restrictions Weight Bearing Restrictions: No  Pain: Pain Assessment Pain Assessment: No/denies pain ADL: See Function Navigator for Current Functional Status.   Therapy/Group: Individual Therapy  Illana Nolting OTR/L 07/10/2015, 3:57 PM

## 2015-07-11 ENCOUNTER — Inpatient Hospital Stay (HOSPITAL_COMMUNITY): Payer: Medicaid Other | Admitting: Speech Pathology

## 2015-07-11 ENCOUNTER — Inpatient Hospital Stay (HOSPITAL_COMMUNITY): Payer: Medicaid Other | Admitting: Occupational Therapy

## 2015-07-11 ENCOUNTER — Inpatient Hospital Stay (HOSPITAL_COMMUNITY): Payer: Self-pay | Admitting: Occupational Therapy

## 2015-07-11 ENCOUNTER — Inpatient Hospital Stay (HOSPITAL_COMMUNITY): Payer: Medicaid Other

## 2015-07-11 NOTE — Progress Notes (Signed)
Patient A/O (person/place). No noted distress and denies pain. Patient slept well throughout the night. Staff will continue to meet patient's need and monitor. LBM 07/10/15.

## 2015-07-11 NOTE — Progress Notes (Signed)
Speech Language Pathology Daily Session Note  Patient Details  Name: Gerald [name] MRN: [id number] Date of Birth: [birth year]  Today's Date: 07/11/2015 SLP Individual Time: [id number] SLP Individual Time Calculation (min): 58 min  Short Term Goals: Week 1: SLP Short Term Goal 1 (Week 1): Pt will demonstrate complete naming tasks with 80% acc min A.  SLP Short Term Goal 2 (Week 1): Pt will demonstrate sentence comprehension with 80% acc with min A. SLP Short Term Goal 3 (Week 1): Pt will follow 3-step or complex directives with 75% acc min A. SLP Short Term Goal 4 (Week 1): Pt will communicate basic needs/wants via any means with min A. SLP Short Term Goal 5 (Week 1): Pt will intiate verbal responses with min visual/verbal cueing with 75% acc. SLP Short Term Goal 6 (Week 1): Pt will demonstrate recogition of 3-5 objects given a 5 min delay with min A.  Skilled Therapeutic Interventions: Pt was a little groggy at the beginning to therapy as this therapist woke him up. Used breakfast tray to work on [place]. Pt able to label items with 100% acc with increased time, but required leading phrase for other communication such as "I am finished with (my tray)." Word to picture matching 5/5. Selecting accurate word to picture with field of 2 options required min A to add structure and review directions- pt frequently self-corrected as well. Novel 2-step directives 5/7. Pt able to name family members in birth order at [facility] I with self-correction. Pt required min - mod A to communicate needs re: eating and toileting. Discussed communicating via any means possible- gestural, verbal, pointing, etc. Pt able to use environmental support to be oriented to time which required recall of a conversation from previous session re: location of date on the clock in the pt's room.   Function:  Eating Eating   Modified Consistency Diet: No Eating Assist Level: Set up assist for   Eating Set Up Assist For: Opening  containers       Cognition Comprehension Comprehension assist level: Understands basic 75 - 89% of the time/ requires cueing 10 - 24% of the time  Expression   Expression assist level: Expresses basic 25 - 49% of the time/requires cueing 50 - 75% of the time. Uses single words/gestures.  Social Interaction Social Interaction assist level: Interacts appropriately 90% of the time - Needs monitoring or encouragement for participation or interaction.  Problem Solving Problem solving assist level: Solves basic 25 - 49% of the time - needs direction more than half the time to initiate, plan or complete simple activities  Memory Memory assist level: Recognizes or recalls 50 - 74% of the time/requires cueing 25 - 49% of the time    Pain Pain Assessment Pain Assessment: No/denies pain  Therapy/Group: Individual Therapy  Vinetta Bergamo 07/11/2015, 11:42 AM

## 2015-07-11 NOTE — Progress Notes (Signed)
Speech Language Pathology Daily Session Note  Patient Details  Name: Gerald [name] MRN: [id number] Date of Birth: 21-Sep-1960  Today's Date: 07/11/2015 SLP Individual Time: [id number] SLP Individual Time Calculation (min): 41 min  Short Term Goals: Week 1: SLP Short Term Goal 1 (Week 1): Pt will demonstrate complete naming tasks with 80% acc min A.  SLP Short Term Goal 2 (Week 1): Pt will demonstrate sentence comprehension with 80% acc with min A. SLP Short Term Goal 3 (Week 1): Pt will follow 3-step or complex directives with 75% acc min A. SLP Short Term Goal 4 (Week 1): Pt will communicate basic needs/wants via any means with min A. SLP Short Term Goal 5 (Week 1): Pt will intiate verbal responses with min visual/verbal cueing with 75% acc. SLP Short Term Goal 6 (Week 1): Pt will demonstrate recogition of 3-5 objects given a 5 min delay with min A.  Skilled Therapeutic Interventions: Pt was seen for skilled ST targeting communication goals.  Pt was able to complete opposite pairs from a written choice of three for >90% accuracy with supervision, pt was also able to match definition to word from a field of three for ~80% accuracy with min assist verbal cues.  Pt demonstrated increased difficulty when completing a sentence with a phrase when provided with three choices and required up to max assist verbal cues for 75% accuracy.  Pt was ~80% accurate for responsive naming with min assist verbal cues.  Pt was left in bed with call bell within reach and family at bedside.  Continue per current plan of care.     Function:  Eating Eating                 Cognition Comprehension Comprehension assist level: Understands basic 75 - 89% of the time/ requires cueing 10 - 24% of the time  Expression   Expression assist level: Expresses basic 25 - 49% of the time/requires cueing 50 - 75% of the time. Uses single words/gestures.  Social Interaction Social Interaction assist level: Interacts  appropriately 90% of the time - Needs monitoring or encouragement for participation or interaction.  Problem Solving Problem solving assist level: Solves basic 25 - 49% of the time - needs direction more than half the time to initiate, plan or complete simple activities  Memory Memory assist level: Recognizes or recalls 50 - 74% of the time/requires cueing 25 - 49% of the time    Pain Pain Assessment Pain Assessment: No/denies pain  Therapy/Group: Individual Therapy  Allisha Harter, [name] 07/11/2015, 4:33 PM

## 2015-07-11 NOTE — Progress Notes (Signed)
Subjective/Complaints: No problems in therapy or overnite ROS- denies CP/SOB, N/V/D  Objective: Vital Signs: Blood pressure 137/76, pulse 62, temperature 98.4 F (36.9 C), temperature source Oral, resp. rate 16, height 6' (1.829 m), weight 77.61 kg (171 lb 1.6 oz), SpO2 98 %. No results found. Results for orders placed or performed during the hospital encounter of 07/08/15 (from the past 72 hour(s))  CBC     Status: None   Collection Time: 07/08/15  6:27 PM  Result Value Ref Range   WBC 8.1 4.0 - 10.5 K/uL   RBC 4.63 4.22 - 5.81 MIL/uL   Hemoglobin 14.6 13.0 - 17.0 g/dL   HCT 44.8 39.0 - 52.0 %   MCV 96.8 78.0 - 100.0 fL   MCH 31.5 26.0 - 34.0 pg   MCHC 32.6 30.0 - 36.0 g/dL   RDW 12.8 11.5 - 15.5 %   Platelets 295 150 - 400 K/uL  Creatinine, serum     Status: None   Collection Time: 07/08/15  6:27 PM  Result Value Ref Range   Creatinine, Ser 0.98 0.61 - 1.24 mg/dL   GFR calc non Af Amer >60 >60 mL/min   GFR calc Af Amer >60 >60 mL/min    Comment: (NOTE) The eGFR has been calculated using the CKD EPI equation. This calculation has not been validated in all clinical situations. eGFR's persistently <60 mL/min signify possible Chronic Kidney Disease.   CBC WITH DIFFERENTIAL     Status: None   Collection Time: 07/09/15  5:33 AM  Result Value Ref Range   WBC 6.6 4.0 - 10.5 K/uL   RBC 4.49 4.22 - 5.81 MIL/uL   Hemoglobin 14.2 13.0 - 17.0 g/dL   HCT 43.3 39.0 - 52.0 %   MCV 96.4 78.0 - 100.0 fL   MCH 31.6 26.0 - 34.0 pg   MCHC 32.8 30.0 - 36.0 g/dL   RDW 12.7 11.5 - 15.5 %   Platelets 302 150 - 400 K/uL   Neutrophils Relative % 52 %   Neutro Abs 3.5 1.7 - 7.7 K/uL   Lymphocytes Relative 33 %   Lymphs Abs 2.2 0.7 - 4.0 K/uL   Monocytes Relative 9 %   Monocytes Absolute 0.6 0.1 - 1.0 K/uL   Eosinophils Relative 5 %   Eosinophils Absolute 0.3 0.0 - 0.7 K/uL   Basophils Relative 1 %   Basophils Absolute 0.0 0.0 - 0.1 K/uL  Comprehensive metabolic panel     Status:  Abnormal   Collection Time: 07/09/15  5:33 AM  Result Value Ref Range   Sodium 141 135 - 145 mmol/L   Potassium 4.0 3.5 - 5.1 mmol/L   Chloride 103 101 - 111 mmol/L   CO2 24 22 - 32 mmol/L   Glucose, Bld 110 (H) 65 - 99 mg/dL   BUN 12 6 - 20 mg/dL   Creatinine, Ser 0.98 0.61 - 1.24 mg/dL   Calcium 8.9 8.9 - 10.3 mg/dL   Total Protein 6.8 6.5 - 8.1 g/dL   Albumin 3.3 (L) 3.5 - 5.0 g/dL   AST 32 15 - 41 U/L   ALT 37 17 - 63 U/L   Alkaline Phosphatase 53 38 - 126 U/L   Total Bilirubin 0.9 0.3 - 1.2 mg/dL   GFR calc non Af Amer >60 >60 mL/min   GFR calc Af Amer >60 >60 mL/min    Comment: (NOTE) The eGFR has been calculated using the CKD EPI equation. This calculation has not been validated in all  clinical situations. eGFR's persistently <60 mL/min signify possible Chronic Kidney Disease.    Anion gap 14 5 - 15     HEENT: normal and poor dentition Cardio: RRR and no murmur Resp: RRR and no murmur GI: BS positive and NT, ND Extremity:  Pulses positive and No Edema Skin:   Intact Neuro: oriented to person and [place] not [facility] or time, Flat, Abnormal Sensory absent LT in RUE and RLE, intact on left side, Abnormal Motor 2- R arm ext, 0/5 grip, 0/5 finger and wrist ext, trace R hip ext, Dysarthric and Other naming intact but decreased fluency Musc/Skel:  Other no pain with UE or LE ROM Gen NAD   Assessment/Plan: 1. Functional deficits secondary to Left ACA infarct with Right HP which require 3+ hours per day of interdisciplinary therapy in a comprehensive inpatient rehab setting. Physiatrist is providing close team supervision and 24 hour management of active medical problems listed below. Physiatrist and rehab team continue to assess barriers to discharge/monitor patient progress toward functional and medical goals. FIM: Function - Bathing Position: Shower Body parts bathed by patient: Right arm, Chest, Abdomen, Front perineal area, Right upper leg, Left upper leg Body parts bathed  by helper: Back, Left lower leg, Right lower leg, Buttocks, Left arm Assist Level: 2 helpers  Function- Upper Body Dressing/Undressing What is the patient wearing?: Pull over shirt/dress Pull over shirt/dress - Perfomed by patient: Thread/unthread left sleeve, Put head through opening Pull over shirt/dress - Perfomed by helper: Pull shirt over trunk, Thread/unthread right sleeve Function - Lower Body Dressing/Undressing What is the patient wearing?: Pants, Socks, Shoes Position: Wheelchair/chair at sink Pants- Performed by patient: Thread/unthread left pants leg Pants- Performed by helper: Pull pants up/down, Thread/unthread right pants leg Socks - Performed by patient: Don/doff right sock, Don/doff left sock Shoes - Performed by patient: Don/doff left shoe Shoes - Performed by helper: Fasten right, Fasten left, Don/doff right shoe Assist for lower body dressing: 2 Helpers  Function - Toileting Toileting steps completed by helper: Adjust clothing prior to toileting, Performs perineal hygiene, Adjust clothing after toileting  Function - Toilet Transfers Toilet transfer assistive device: Bedside commode Assist level to toilet: Moderate assist (Pt 50 - 74%/lift or lower) Assist level from toilet: Moderate assist (Pt 50 - 74%/lift or lower) Assist level to bedside commode (at bedside): Maximal assist (Pt 25 - 49%/lift and lower) Assist level from bedside commode (at bedside): Maximal assist (Pt 25 - 49%/lift and lower)  Function - Chair/bed transfer Chair/bed transfer method: Lateral scoot Chair/bed transfer assist level: 2 helpers Chair/bed transfer assistive device: Sliding board Chair/bed transfer details: Verbal cues for technique, Manual facilitation for weight shifting, Manual facilitation for placement, Manual facilitation for weight bearing  Function - Locomotion: Wheelchair Will patient use wheelchair at discharge?: Yes Type: Manual Max wheelchair distance: 25 Assist Level:  Maximal assistance (Pt 25 - 49%) Wheel 50 feet with 2 turns activity did not occur: Safety/medical concerns (fatigue) Wheel 150 feet activity did not occur: Safety/medical concerns (fatigue) Turns around,maneuvers to table,bed, and toilet,negotiates 3% grade,maneuvers on rugs and over doorsills: No Function - Locomotion: Ambulation Ambulation activity did not occur: Safety/medical concerns (lack of sensation) Walk 10 feet activity did not occur: Safety/medical concerns Walk 50 feet with 2 turns activity did not occur: Safety/medical concerns Walk 150 feet activity did not occur: Safety/medical concerns Walk 10 feet on uneven surfaces activity did not occur: Safety/medical concerns  Function - Comprehension Comprehension: Auditory Comprehension assist level: Understands basic 75 - 89%  of the time/ requires cueing 10 - 24% of the time  Function - Expression Expression: Verbal Expression assist level: Expresses basic 25 - 49% of the time/requires cueing 50 - 75% of the time. Uses single words/gestures.  Function - Social Interaction Social Interaction assist level: Interacts appropriately 90% of the time - Needs monitoring or encouragement for participation or interaction.  Function - Problem Solving Problem solving assist level: Solves basic 25 - 49% of the time - needs direction more than half the time to initiate, plan or complete simple activities  Function - Memory Memory assist level: Recognizes or recalls 50 - 74% of the time/requires cueing 25 - 49% of the time Patient normally able to recall (first 3 days only): That he or she is in a hospital, Current season  Medical Problem List and Plan: 1.  Right hemiparesis, Right hemisensory deficits and dysarthria secondary to left ACA territory infarct-expect UE>LE recovery                                   2.  DVT Prophylaxis/Anticoagulation: Subcutaneous Lovenox. Monitor platelet counts and any signs of bleeding, plt 302K 3. Pain  Management: Tylenol as needed. Pain controlled at present 4. Hypertension. Norvasc 5 mg daily, lisinopril 20 mg daily. 131/77 Monitor with increased mobility 5. Neuropsych: This patient is capable of making decisions on his own behalf. 6. Skin/Wound Care: Routine skin checks. OOB. Encourage appropriate nutrition 7. Fluids/Electrolytes/Nutrition: Routine I&O's with follow-up chemistries in AM 8. Hyperlipidemia. Lipitor 9. Tobacco/ cocaine/alcohol abuse. Provide counseling during rehab stay- discussed effect of cocaine on BP and recurrent stroke risk 10.  Hypoalbuminemia- mild will supplement po  LOS (Days) 3 A FACE TO FACE EVALUATION WAS PERFORMED  Marty Uy E 07/11/2015, 7:02 AM

## 2015-07-11 NOTE — Progress Notes (Signed)
Physical Therapy Session Note  Patient Details  Name: Gerald [name] MRN: [id number] Date of Birth: Dec 27, 1960  Today's Date: 07/11/2015 PT Individual Time: [id number] PT Individual Time Calculation (min): 41 min   Short Term Goals: Week 1:  PT Short Term Goal 1 (Week 1): pt will transfer consistently with assist of 1 PT Short Term Goal 2 (Week 1): pt will propel w/c x 50' with supervision PT Short Term Goal 3 (Week 1): pt will perform sit>stand with asssit of 1  Skilled Therapeutic Interventions/Progress Updates:    Session focused on functional bed mobility retraining with cues for technique and motor planning with physical assist for RLE and facilitation for trunk to get into full upright sitting position, slideboard transfer training with focus on head/hips relationship, functional use of LUE/LLE, neuro re-ed for balance and awareness of body in space, and technique (max A of 1 person to the L but +2 for safety to the R due to posterior lateral lean), neuro re-ed from EOM for sit <> stands, postural control re-training, and weightshifting to increase weightbearing to LLE due to R lateral lean, and therapeutic activity to address w/c mobility using L hemi-technique with max assist and verbal cues. Pt able to perform sit <> stands with mod/max assist +2 with mirror in front of patient for visual feedback and facilitation for weightshifting to the L for improved postural control and reorientation to midline.   Therapy Documentation Precautions:  Precautions Precautions: Fall Precaution Comments: dense right hemiparesis Restrictions Weight Bearing Restrictions: No  Pain:  Denies pain.   See Function Navigator for Current Functional Status.   Therapy/Group: Individual Therapy  Canary Brim Ivory Broad, PT, DPT  07/11/2015, 3:40 PM

## 2015-07-11 NOTE — Progress Notes (Signed)
Occupational Therapy Session Note  Patient Details  Name: Gerald [name] MRN: [id number] Date of Birth: 23-Feb-1961  Today's Date: 07/11/2015 OT Individual Time: [id number] OT Individual Time Calculation (min): 70 min    Short Term Goals: Week 1:  OT Short Term Goal 1 (Week 1): Pt will maintain dynamic sitting balance during bathing tasks with mod assist. OT Short Term Goal 2 (Week 1): Pt will complete UB dressing in supported sitting with supervision. OT Short Term Goal 3 (Week 1): Pt will complete LB dressing with mod assist sit to stand. OT Short Term Goal 4 (Week 1): Pt will use the RUE as a stabilizer with mod assist during grooming and bathing tasks.    Skilled Therapeutic Interventions/Progress Updates:    Pt completed bathing at shower level during session.  Still exhibits increased LOB to the right in sitting with dynamic reaching tasks sitting upright unsupported.  Total assist for sit to stand with +2 to safely complete washing peri area of pulling pants over hips.  Pt with left right discrimination issues and needed max demonstrational cueing for remembering to dress the impaired UE or LE first.  Max assist for crossing the RLE over the left knee to complete dressing as well.  He continues to need max hand over hand assist for integration of the RUE into bathing tasks.  Noted right hand flexion and extension, at times more movement noted than others.  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  Issued shoe buttons which pt was able to complete with demonstration.  Pt left in wheelchair with safety belt and right arm trough in place.  Call button beside of him as well.  Educated pt to work on opening and closing the right hand over the weekend and when not in therapy.    Therapy Documentation Precautions:   Precautions Precautions: Fall Precaution Comments: dense right hemiparesis Restrictions Weight Bearing Restrictions: No  Pain: Pain Assessment Pain Assessment: No/denies pain ADL: See Function Navigator for Current Functional Status.   Therapy/Group: Individual Therapy  Ara Mano OTR/L 07/11/2015, 12:36 PM

## 2015-07-12 ENCOUNTER — Inpatient Hospital Stay (HOSPITAL_COMMUNITY): Payer: Medicaid Other | Admitting: Speech Pathology

## 2015-07-12 ENCOUNTER — Inpatient Hospital Stay (HOSPITAL_COMMUNITY): Payer: Medicaid Other | Admitting: Physical Therapy

## 2015-07-12 ENCOUNTER — Inpatient Hospital Stay (HOSPITAL_COMMUNITY): Payer: Medicaid Other

## 2015-07-12 MED ORDER — CYCLOBENZAPRINE HCL 5 MG PO TABS
5.0000 mg | ORAL_TABLET | Freq: Three times a day (TID) | ORAL | Status: DC | PRN
  Administered 2015-07-12 – 2015-07-26 (×5): 5 mg via ORAL
  Filled 2015-07-12 (×5): qty 1

## 2015-07-12 NOTE — Progress Notes (Signed)
Speech Language Pathology Daily Session Note  Patient Details  Name: Gerald [name] MRN: [id number] Date of Birth: 11-24-1960  Today's Date: 07/12/2015 SLP Individual Time: 0800-0900 SLP Individual Time Calculation (min): 60 min  Short Term Goals: Week 1: SLP Short Term Goal 1 (Week 1): Pt will demonstrate complete naming tasks with 80% acc min A.  SLP Short Term Goal 2 (Week 1): Pt will demonstrate sentence comprehension with 80% acc with min A. SLP Short Term Goal 3 (Week 1): Pt will follow 3-step or complex directives with 75% acc min A. SLP Short Term Goal 4 (Week 1): Pt will communicate basic needs/wants via any means with min A. SLP Short Term Goal 5 (Week 1): Pt will intiate verbal responses with min visual/verbal cueing with 75% acc. SLP Short Term Goal 6 (Week 1): Pt will demonstrate recogition of 3-5 objects given a 5 min delay with min A.  Skilled Therapeutic Interventions: Skilled treatment session focused on communication goals. SLP facilitated session by providing Mod visual and phonemic cues for decoding at the sentence level. Patient demonstrated reading comprehension at the sentence level with 75% accuracy independently and 100% accuracy with Mod A question and visual cues. Patient reporting cramping of his legs throughout session and RN administered medications. Patient was also incontinent of bowel and required Mod A verbal cues to follow basic directions during self-care tasks. Patient left upright in bed with girlfriend present and all needs within reach. Continue with current plan of care.    Function:  Eating Eating   Modified Consistency Diet: No Eating Assist Level: Set up assist for   Eating Set Up Assist For: Opening containers       Cognition Comprehension Comprehension assist level: Understands basic 75 - 89% of the time/ requires cueing 10 - 24% of the time  Expression   Expression assist level: Expresses basic 25 - 49% of the time/requires cueing 50 -  75% of the time. Uses single words/gestures.  Social Interaction Social Interaction assist level: Interacts appropriately 90% of the time - Needs monitoring or encouragement for participation or interaction.  Problem Solving Problem solving assist level: Solves basic 25 - 49% of the time - needs direction more than half the time to initiate, plan or complete simple activities  Memory Memory assist level: Recognizes or recalls 50 - 74% of the time/requires cueing 25 - 49% of the time    Pain 10/10 cramping in left leg. RN made aware and administered medications, patient also repositioned.   Therapy/Group: Individual Therapy  Lenny Bouchillon 07/12/2015, 3:28 PM

## 2015-07-12 NOTE — Progress Notes (Signed)
Occupational Therapy Session Note  Patient Details  Name: Gerald [name] MRN: [id number] Date of Birth: 01-26-1961  Today's Date: 07/12/2015 OT Individual Time: 1000-1100 OT Individual Time Calculation (min): 60 min    Short Term Goals: Week 1:  OT Short Term Goal 1 (Week 1): Pt will maintain dynamic sitting balance during bathing tasks with mod assist. OT Short Term Goal 2 (Week 1): Pt will complete UB dressing in supported sitting with supervision. OT Short Term Goal 3 (Week 1): Pt will complete LB dressing with mod assist sit to stand. OT Short Term Goal 4 (Week 1): Pt will use the RUE as a stabilizer with mod assist during grooming and bathing tasks.    Skilled Therapeutic Interventions/Progress Updates: ADL-retraining with focus on dynamic sitting balance, weight-shifting, improved functional use/awareness of RUE, and adapted bathing/dressing skills.   Pt received supine in bed, receptive for treatment with significant other (SO) seated in room observing and encouraging performance.   Pt completes bed mobility and stand-pivot transfer to w/c with max assist although maintaining static sitting balance using left hand for 20 seconds during setup of w/c.  Pt re-educated on weight-shifting and placement of w/c and positioning of his legs to facilitate transfer with moderate lean to right supported during assisted transfer.   Pt re-educated on hooking his left foot under right ankle and attending to R-U/LE during BADL.   Pt unaware of large BM requiring max assist to clean during seated shower.    Pt able to complete sit<>stand at sink and maintain static standing supported by therapist (mod assist) while assisted with donning brief and pants.   Pt re-educated on donning shirt, right side first.  Pt left in w/c with SO assisting grooming at sink.       Therapy Documentation Precautions:  Precautions Precautions: Fall Precaution Comments: dense right hemiparesis Restrictions Weight Bearing  Restrictions: No  Pain: No/denies pain    See Function Navigator for Current Functional Status.   Therapy/Group: Individual Therapy  [place] 07/12/2015, 1:07 PM

## 2015-07-12 NOTE — Progress Notes (Signed)
Gerald [name] is a 55 y.o. male 1961/04/07 [id number]  Subjective: No new complaints. No new problems. Feeling OK.  Objective: Vital signs in last 24 hours: Temp:  [98.2 F (36.8 C)-98.5 F (36.9 C)] 98.5 F (36.9 C) (01/28 0538) Pulse Rate:  [74-86] 74 (01/28 0538) Resp:  [17-18] 17 (01/28 0538) BP: (139-149)/(81-87) 149/85 mmHg (01/28 0834) SpO2:  [97 %-99 %] 99 % (01/28 0538) Weight change:  Last BM Date: 07/12/15  Intake/Output from previous day: 01/27 0701 - 01/28 0700 In: 720 [P.O.:720] Out: -   Physical Exam General: No apparent distress   R HP Lungs: Normal effort. Lungs clear to auscultation, no crackles or wheezes. Cardiovascular: Regular rate and rhythm, no edema Neurological: No new neurological deficits   Lab Results: BMET    Component Value Date/Time   NA 141 07/09/2015 0533   K 4.0 07/09/2015 0533   CL 103 07/09/2015 0533   CO2 24 07/09/2015 0533   GLUCOSE 110* 07/09/2015 0533   BUN 12 07/09/2015 0533   CREATININE 0.98 07/09/2015 0533   CALCIUM 8.9 07/09/2015 0533   GFRNONAA >60 07/09/2015 0533   GFRAA >60 07/09/2015 0533   CBC    Component Value Date/Time   WBC 6.6 07/09/2015 0533   RBC 4.49 07/09/2015 0533   HGB 14.2 07/09/2015 0533   HCT 43.3 07/09/2015 0533   PLT 302 07/09/2015 0533   MCV 96.4 07/09/2015 0533   MCH 31.6 07/09/2015 0533   MCHC 32.8 07/09/2015 0533   RDW 12.7 07/09/2015 0533   LYMPHSABS 2.2 07/09/2015 0533   MONOABS 0.6 07/09/2015 0533   EOSABS 0.3 07/09/2015 0533   BASOSABS 0.0 07/09/2015 0533   CBG's (last 3):  No results for input(s): GLUCAP in the last 72 hours. LFT's Lab Results  Component Value Date   ALT 37 07/09/2015   AST 32 07/09/2015   ALKPHOS 53 07/09/2015   BILITOT 0.9 07/09/2015    Studies/Results: No results found.  Medications:  I have reviewed the patient's current medications. Scheduled Medications: . amLODipine  10 mg Oral Daily  . aspirin  325 mg Oral Daily  . atorvastatin  20 mg  Oral q1800  . enoxaparin (LOVENOX) injection  40 mg Subcutaneous Q24H  . folic acid  1 mg Oral Daily  . lisinopril  40 mg Oral Daily  . multivitamin with minerals  1 tablet Oral Daily  . 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 Right hemiparesis, Right hemisensory deficits and dysarthria secondary to left ACA territory infarct-expect UE>LE recovery - continue CIR 2. DVT Prophylaxis/Anticoagulation: Subcutaneous Lovenox. Monitor platelet counts and any signs of bleeding, plt 302K 3. Pain Management: Tylenol as needed. Pain controlled at present 4. Hypertension. Norvasc 5 mg daily, lisinopril 20 mg daily. Monitor with increased mobility 5. Neuropsych: This patient is capable of making decisions on his own behalf. 6. Skin/Wound Care: Routine skin checks. OOB. Encourage appropriate nutrition 7. Fluids/Electrolytes/Nutrition: Routine I&O's with follow-up chemistries prn 8. Hyperlipidemia. Lipitor 9. Tobacco/ cocaine/alcohol abuse. Provide counseling during rehab stay- discussed effect of cocaine on BP and recurrent stroke risk    Length of stay, days: 4   Gerald Nouri A. Asa Lente,  MD 07/12/2015, 11:46 AM

## 2015-07-12 NOTE — Progress Notes (Signed)
Physical Therapy Session Note  Patient Details  Name: Gerald [name] MRN: [id number] Date of Birth: [birth year]  Today's Date: 07/12/2015 PT Individual Time: [id number] PT Individual Time Calculation (min): 60 min   Short Term Goals: Week 1:  PT Short Term Goal 1 (Week 1): pt will transfer consistently with assist of 1 PT Short Term Goal 2 (Week 1): pt will propel w/c x 50' with supervision PT Short Term Goal 3 (Week 1): pt will perform sit>stand with asssit of 1  Skilled Therapeutic Interventions/Progress Updates:   Pt received in w/c; agreeable to therapy and no reports of pain.  Performed w/c mobility training with L hemi technique with max A overall with hand over hand initially to sequence UE propulsion and use of LE to steer transitioning to max A at LLE for steering and verbal cues for attention to R environment.  In gym performed NMR with gait and stair negotiation; see below for details.  Pt reporting need to have a BM.  Returned to room and performed supported standing at sink x 2-3 reps with LUE support on sink and therapist on R side providing facilitation at trunk, pelvis and RLE for stabilization, balance and postural control while NT assisted with clothing management and hygiene after prolonged time seated on BSC with min-mod A for trunk control, sitting balance while having a BM.  Pt reporting increased pain in abdomen and rectum with straining-RN notified.  At end of session pt very fatigued and requesting to return to bed.  Performed squat pivot w/c > bed to R side with focus on use of head-hips relationship with max A to pivot fully to bed.  Performed sit > supine with mod A and performed bridging and scooting to reposition in bed with min-mod A.  Educated caregiver on diagram above bed depicting appropriate positioning of RUE when supine for joint protection and to maintain ROM; also performed passive stretching on RUE to maintain UE, wrist and finger extension in positioning.  Pt  left in bed with caregiver present with all items within reach.   Therapy Documentation Precautions:  Precautions Precautions: Fall Precaution Comments: dense right hemiparesis Restrictions Weight Bearing Restrictions: No Vital Signs: Therapy Vitals Temp: 98.6 F (37 C) Temp Source: Oral Pulse Rate: 98 Resp: 18 BP: 140/82 mmHg Patient Position (if appropriate): Sitting Oxygen Therapy SpO2: 100 % O2 Device: Not Delivered Pain:   Other Treatments: Treatments Neuromuscular Facilitation: Right;Lower Extremity;Forced use;Activity to increase motor control;Activity to increase timing and sequencing;Activity to increase sustained activation;Activity to increase lateral weight shifting;Activity to increase anterior-posterior weight shifting during gait in hallway with LUE support on rail x 25' as well as 3 stair negotiation with UE support on L rail forwards to ascend, backwards to descend with focus on facilitation of upright trunk and cues for L thoracic lengthening with weight shift to L in order to advance RLE and stabilization of RLE during stance-pt noted to have increased flexor and ADD tone in UE and LE during gait and stair negotiation.     See Function Navigator for Current Functional Status.   Therapy/Group: Individual Therapy  Gerald [name] [facility] 07/12/2015, 3:47 PM

## 2015-07-13 NOTE — Progress Notes (Signed)
Gerald [name] is a 55 y.o. male September 27, 1960 [id number]  Subjective: No new complaints. No new problems. Feeling OK. Denies pain or SOB  Objective: Vital signs in last 24 hours: Temp:  [97.9 F (36.6 C)-98.6 F (37 C)] 97.9 F (36.6 C) (01/29 0549) Pulse Rate:  [70-98] 70 (01/29 0549) Resp:  [18] 18 (01/29 0549) BP: (139-140)/(78-82) 139/78 mmHg (01/29 0549) SpO2:  [97 %-100 %] 97 % (01/29 0549) Weight change:  Last BM Date: 07/12/15  Intake/Output from previous day: 01/28 0701 - 01/29 0700 In: 720 [P.O.:480] Out: -   Physical Exam General: No apparent distress   R HP; wife at side Lungs: Normal effort. Lungs clear to auscultation, no crackles or wheezes. Cardiovascular: Regular rate and rhythm, no edema Neurological: No new neurological deficits   Lab Results: BMET    Component Value Date/Time   NA 141 07/09/2015 0533   K 4.0 07/09/2015 0533   CL 103 07/09/2015 0533   CO2 24 07/09/2015 0533   GLUCOSE 110* 07/09/2015 0533   BUN 12 07/09/2015 0533   CREATININE 0.98 07/09/2015 0533   CALCIUM 8.9 07/09/2015 0533   GFRNONAA >60 07/09/2015 0533   GFRAA >60 07/09/2015 0533   CBC    Component Value Date/Time   WBC 6.6 07/09/2015 0533   RBC 4.49 07/09/2015 0533   HGB 14.2 07/09/2015 0533   HCT 43.3 07/09/2015 0533   PLT 302 07/09/2015 0533   MCV 96.4 07/09/2015 0533   MCH 31.6 07/09/2015 0533   MCHC 32.8 07/09/2015 0533   RDW 12.7 07/09/2015 0533   LYMPHSABS 2.2 07/09/2015 0533   MONOABS 0.6 07/09/2015 0533   EOSABS 0.3 07/09/2015 0533   BASOSABS 0.0 07/09/2015 0533   CBG's (last 3):  No results for input(s): GLUCAP in the last 72 hours. LFT's Lab Results  Component Value Date   ALT 37 07/09/2015   AST 32 07/09/2015   ALKPHOS 53 07/09/2015   BILITOT 0.9 07/09/2015    Studies/Results: No results found.  Medications:  I have reviewed the patient's current medications. Scheduled Medications: . amLODipine  10 mg Oral Daily  . aspirin  325 mg Oral  Daily  . atorvastatin  20 mg Oral q1800  . enoxaparin (LOVENOX) injection  40 mg Subcutaneous Q24H  . folic acid  1 mg Oral Daily  . lisinopril  40 mg Oral Daily  . multivitamin with minerals  1 tablet Oral Daily  . 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 Right hemiparesis, Right hemisensory deficits and dysarthria secondary to left ACA territory infarct-expect UE>LE recovery - continue CIR 2. DVT Prophylaxis/Anticoagulation: Subcutaneous Lovenox. Monitor platelet counts and any signs of bleeding, plt 302K 3. Pain Management: Tylenol as needed. Pain controlled at present 4. Hypertension. Norvasc 5 mg daily, lisinopril 20 mg daily. Monitor with increased mobility 5. Neuropsych: This patient is capable of making decisions on his own behalf. 6. Skin/Wound Care: Routine skin checks. OOB. Encourage appropriate nutrition 7. Fluids/Electrolytes/Nutrition: Routine I&O's with follow-up chemistries prn 8. Hyperlipidemia. Lipitor 9. Tobacco/ cocaine/alcohol abuse. Provide counseling during rehab stay- previously discussed effect of cocaine on BP and recurrent stroke risk    Length of  stay, days: 5   Gerald [name] A. Asa Lente, MD 07/13/2015, 9:53 AM

## 2015-07-14 ENCOUNTER — Inpatient Hospital Stay (HOSPITAL_COMMUNITY): Payer: Medicaid Other | Admitting: Occupational Therapy

## 2015-07-14 ENCOUNTER — Inpatient Hospital Stay (HOSPITAL_COMMUNITY): Payer: Medicaid Other

## 2015-07-14 ENCOUNTER — Inpatient Hospital Stay (HOSPITAL_COMMUNITY): Payer: Medicaid Other | Admitting: Speech Pathology

## 2015-07-14 NOTE — Progress Notes (Signed)
Speech Language Pathology Daily Session Note  Patient Details  Name: Gerald [name] MRN: [id number] Date of Birth: 1960-10-09  Today's Date: 07/14/2015 SLP Individual Time: 0903-1000 SLP Individual Time Calculation (min): 57 min  Short Term Goals: Week 1: SLP Short Term Goal 1 (Week 1): Pt will demonstrate complete naming tasks with 80% acc min A.  SLP Short Term Goal 2 (Week 1): Pt will demonstrate sentence comprehension with 80% acc with min A. SLP Short Term Goal 3 (Week 1): Pt will follow 3-step or complex directives with 75% acc min A. SLP Short Term Goal 4 (Week 1): Pt will communicate basic needs/wants via any means with min A. SLP Short Term Goal 5 (Week 1): Pt will intiate verbal responses with min visual/verbal cueing with 75% acc. SLP Short Term Goal 6 (Week 1): Pt will demonstrate recogition of 3-5 objects given a 5 min delay with min A.  Skilled Therapeutic Interventions:  Pt was seen for skilled ST targeting communication goals.  Pt was transferred to wheelchair with +2 assistance from nurse tech.  Prior to leaving room, pt initiated request to donn a clean shirt which he was able to do with min assist.  Pt was able to comprehend basic sentences for 80% accuracy independently, improving to 100% accuracy with min assist; however, with functional reading comprehension of signs, labels and instructions, pt was only 50% accurate independently, improving to 67% accuracy with min assist, and 83% accuracy with mod assist.  Pt limited today during therapy session due to persistent spasms in right arm but remained agreeable to participate in tasks with intermittent rest breaks.  RN made aware and medications administered.  Pt returned to room and left in wheelchair with call bell within reach and quick release belt donned.  Continue per current plan of care.    Function:  Eating Eating                 Cognition Comprehension Comprehension assist level: Understands basic 75 - 89%  of the time/ requires cueing 10 - 24% of the time  Expression   Expression assist level: Expresses basic 50 - 74% of the time/requires cueing 25 - 49% of the time. Needs to repeat parts of sentences.  Social Interaction Social Interaction assist level: Interacts appropriately 90% of the time - Needs monitoring or encouragement for participation or interaction.  Problem Solving Problem solving assist level: Solves basic 50 - 74% of the time/requires cueing 25 - 49% of the time  Memory Memory assist level: Recognizes or recalls 50 - 74% of the time/requires cueing 25 - 49% of the time    Pain Pain Assessment Pain Assessment: Faces Faces Pain Scale: Hurts whole lot Pain Location: Arm Pain Orientation: Right Pain Descriptors / Indicators: Spasm Pain Intervention(s): RN made aware  Therapy/Group: Individual Therapy  Bailey Kolbe, Elmyra Ricks L 07/14/2015, 12:12 PM

## 2015-07-14 NOTE — Progress Notes (Signed)
Occupational Therapy Session Note  Patient Details  Name: Gerald [name] MRN: [id number] Date of Birth: 1961-04-29  Today's Date: 07/14/2015 OT Individual Time: 1045-1200 OT Individual Time Calculation (min): 75 min    Short Term Goals: Week 1:  OT Short Term Goal 1 (Week 1): Pt will maintain dynamic sitting balance during bathing tasks with mod assist. OT Short Term Goal 2 (Week 1): Pt will complete UB dressing in supported sitting with supervision. OT Short Term Goal 3 (Week 1): Pt will complete LB dressing with mod assist sit to stand. OT Short Term Goal 4 (Week 1): Pt will use the RUE as a stabilizer with mod assist during grooming and bathing tasks.    Skilled Therapeutic Interventions/Progress Updates:  Upon entering the room, pt seated in wheelchair with no signs or symptoms of pain. Pt taken into bathroom for toileting. Stand pivot transfer onto elevated toilet with max A for safety. Pt have BM and needing total A for clothing management and hygiene with 1 person to assist. Pt returning to wheelchair in same manner. Hands washed in sink with min verbal cues and min A for R hand. Pt propelled to dayroom for stretching exercises on table top for shoulder flexion x 10 reps. Pt counting each rep and maintaining correct position with min verbal cues. OT educating and demonstrated self ROM for R UE for shoulder,elbow,wrist, and digits x 10 reps in all planes. Pt showing good safety awareness with tasks with min verbal cues. Pt attempting to propel self back to room with L UE and L LE. Pt propelling wheelchair into wall on R side and unable to correctly use LE to directionally steer chair. Pt returned to room with quick release belt donned and call bell within reach upon exiting the room.   Therapy Documentation Precautions:  Precautions Precautions: Fall Precaution Comments: dense right hemiparesis Restrictions Weight Bearing Restrictions: No General:   Vital Signs: Therapy Vitals BP:  132/82 mmHg Pain: Pain Assessment Pain Assessment: Faces Faces Pain Scale: Hurts whole lot Pain Location: Arm Pain Orientation: Right Pain Descriptors / Indicators: Spasm Pain Intervention(s): RN made aware  See Function Navigator for Current Functional Status.   Therapy/Group: Individual Therapy  [name] 07/14/2015, 12:22 PM

## 2015-07-14 NOTE — Progress Notes (Signed)
Subjective/Complaints: Pt looks more alert this am. "It's Monday" ROS- denies CP/SOB, N/V/D  Objective: Vital Signs: Blood pressure 128/81, pulse 77, temperature 98 F (36.7 C), temperature source Oral, resp. rate 16, height 6' (1.829 m), weight 77.61 kg (171 lb 1.6 oz), SpO2 100 %. No results found. No results found for this or any previous visit (from the past 72 hour(s)).   HEENT: normal and poor dentition Cardio: RRR and no murmur Resp: RRR and no murmur GI: BS positive and NT, ND Extremity:  Pulses positive and No Edema Skin:   Intact Neuro: oriented to person and G[place] not [place] or time, Flat, Abnormal Sensory absent LT in RUE and RLE, intact on left side, Abnormal Motor 2- R arm ext, 2-/5 grip, 0/5 finger and wrist ext, trace R hip ext, Dysarthric and Other naming intact but decreased fluency Musc/Skel:  Other no pain with UE or LE ROM Gen NAD   Assessment/Plan: 1. Functional deficits secondary to Left ACA infarct with Right HP which require 3+ hours per day of interdisciplinary therapy in a comprehensive inpatient rehab setting. Physiatrist is providing close team supervision and 24 hour management of active medical problems listed below. Physiatrist and rehab team continue to assess barriers to discharge/monitor patient progress toward functional and medical goals. FIM: Function - Bathing Position: Shower Body parts bathed by patient: Chest, Right arm, Abdomen, Front perineal area, Right upper leg, Left upper leg Body parts bathed by helper: Left arm, Buttocks, Right lower leg, Left lower leg, Back Assist Level: Touching or steadying assistance(Pt > 75%)  Function- Upper Body Dressing/Undressing What is the patient wearing?: Pull over shirt/dress Pull over shirt/dress - Perfomed by patient: Thread/unthread left sleeve, Put head through opening Pull over shirt/dress - Perfomed by helper: Thread/unthread right sleeve, Pull shirt over trunk Assist Level: Touching or  steadying assistance(Pt > 75%) Function - Lower Body Dressing/Undressing What is the patient wearing?: Pants, Non-skid slipper socks, Underwear Position: Wheelchair/chair at sink Underwear - Performed by patient: Thread/unthread left underwear leg Underwear - Performed by helper: Thread/unthread right underwear leg, Pull underwear up/down Pants- Performed by patient: Thread/unthread left pants leg Pants- Performed by helper: Thread/unthread right pants leg, Pull pants up/down Non-skid slipper socks- Performed by helper: Don/doff right sock, Don/doff left sock Socks - Performed by patient: Don/doff right sock, Don/doff left sock Shoes - Performed by patient: Don/doff left shoe, Fasten right, Fasten left (utilized shoe buttons) Shoes - Performed by helper: Don/doff right shoe Assist for lower body dressing: 2 Helpers  Function - Toileting Toileting activity did not occur: No continent bowel/bladder event Toileting steps completed by helper: Adjust clothing prior to toileting, Performs perineal hygiene, Adjust clothing after toileting Assist level: Two helpers  Function - Air cabin crew transfer activity did not occur: Safety/medical concerns Toilet transfer assistive device: Bedside commode Assist level to toilet: Moderate assist (Pt 50 - 74%/lift or lower) Assist level from toilet: Moderate assist (Pt 50 - 74%/lift or lower) Assist level to bedside commode (at bedside): 2 helpers Assist level from bedside commode (at bedside): 2 helpers  Function - Chair/bed transfer Chair/bed transfer method: Squat pivot Chair/bed transfer assist level: Maximal assist (Pt 25 - 49%/lift and lower) Chair/bed transfer assistive device: Armrests Chair/bed transfer details: Verbal cues for technique, Visual cues for safe use of DME/AE, Manual facilitation for weight bearing, Manual facilitation for weight shifting  Function - Locomotion: Wheelchair Will patient use wheelchair at discharge?:  Yes Type: Manual Max wheelchair distance: 100 Assist Level: Maximal assistance (Pt  25 - 49%) Wheel 50 feet with 2 turns activity did not occur: Safety/medical concerns (fatigue) Assist Level: Maximal assistance (Pt 25 - 49%) Wheel 150 feet activity did not occur: Safety/medical concerns (fatigue) Turns around,maneuvers to table,bed, and toilet,negotiates 3% grade,maneuvers on rugs and over doorsills: No Function - Locomotion: Ambulation Ambulation activity did not occur: Safety/medical concerns (lack of sensation) Assistive device: Rail in hallway Max distance: 25 Assist level: Maximal assist (Pt 25 - 49%) Walk 10 feet activity did not occur: Safety/medical concerns Assist level: Maximal assist (Pt 25 - 49%) Walk 50 feet with 2 turns activity did not occur: Safety/medical concerns Walk 150 feet activity did not occur: Safety/medical concerns Walk 10 feet on uneven surfaces activity did not occur: Safety/medical concerns  Function - Comprehension Comprehension: Auditory Comprehension assist level: Understands basic 75 - 89% of the time/ requires cueing 10 - 24% of the time  Function - Expression Expression: Verbal Expression assist level: Expresses basic 25 - 49% of the time/requires cueing 50 - 75% of the time. Uses single words/gestures.  Function - Social Interaction Social Interaction assist level: Interacts appropriately 90% of the time - Needs monitoring or encouragement for participation or interaction.  Function - Problem Solving Problem solving assist level: Solves basic 25 - 49% of the time - needs direction more than half the time to initiate, plan or complete simple activities  Function - Memory Memory assist level: Recognizes or recalls 50 - 74% of the time/requires cueing 25 - 49% of the time Patient normally able to recall (first 3 days only): Current season, Staff names and faces, That he or she is in a hospital  Medical Problem List and Plan: 1.  Right  hemiparesis, Right hemisensory deficits and dysarthria secondary to left ACA territory infarct-expect UE>LE recovery , increased finger flexion today                                  2.  DVT Prophylaxis/Anticoagulation: Subcutaneous Lovenox. Monitor platelet counts and any signs of bleeding, plt 302K 3. Pain Management: Tylenol as needed. Pain controlled at present 4. Hypertension. Norvasc 5 mg daily, lisinopril 20 mg daily. 128/81 Monitor with increased mobility 5. Neuropsych: This patient is capable of making decisions on his own behalf. 6. Skin/Wound Care: Routine skin checks. OOB. Encourage appropriate nutrition 7. Fluids/Electrolytes/Nutrition: Routine I&O's with follow-up chemistries in AM 8. Hyperlipidemia. Lipitor 9. Tobacco/ cocaine/alcohol abuse. Provide counseling during rehab stay- discussed effect of cocaine on BP and recurrent stroke risk 10.  Hypoalbuminemia- mild will supplement po  LOS (Days) 6 A FACE TO FACE EVALUATION WAS PERFORMED  Renn Stille E 07/14/2015, 7:39 AM

## 2015-07-14 NOTE — Progress Notes (Signed)
Physical Therapy Session Note  Patient Details  Name: Gerald [name] MRN: [id number] Date of Birth: Mar 25, 1961  Today's Date: 07/14/2015 PT Individual Time: 1400-1500 PT Individual Time Calculation (min): 60 min   Short Term Goals: Week 1:  PT Short Term Goal 1 (Week 1): pt will transfer consistently with assist of 1 PT Short Term Goal 2 (Week 1): pt will propel w/c x 50' with supervision PT Short Term Goal 3 (Week 1): pt will perform sit>stand with asssit of 1  Skilled Therapeutic Interventions/Progress Updates:   W/c propulsion through obstacle course of cones x 2 and through narrow path x 2 for R attention, decreasing impulsivity and attention to task.   neuromuscular re-education via forced use, manual cues, VCs for gait using L rail x 22' with max assist of 1 and w/c follow with 2nd person, for progression of RLE, upright trunk, R stance stability.  Pt had difficulty with motor planning, sequencing, narrow BOS and R lateral lean throughout. In supported sitting, pt reached out of BOS L and slightly across midline to facilitate trunk shortening/lengthening/rotating of R trunk while transferring cones from L > R. Pt had LOB R 50% of time due to poor awareness, motor planning.  Slide board transfers L><R w/c to mat, with +2 assist.  RT returned pt to room.    Therapy Documentation Precautions:  Precautions Precautions: Fall Precaution Comments: dense right hemiparesis Restrictions Weight Bearing Restrictions: No   Vital Signs: Therapy Vitals Pulse Rate: 97 BP: 140/82 mmHg Patient Position (if appropriate): Sitting Pain: Pain Assessment Pain Assessment: No/denies pain    See Function Navigator for Current Functional Status.   Therapy/Group: Individual Therapy  Gerald [name] 07/14/2015, 4:28 PM

## 2015-07-15 ENCOUNTER — Inpatient Hospital Stay (HOSPITAL_COMMUNITY): Payer: Medicaid Other | Admitting: Occupational Therapy

## 2015-07-15 ENCOUNTER — Inpatient Hospital Stay (HOSPITAL_COMMUNITY): Payer: Medicaid Other | Admitting: Speech Pathology

## 2015-07-15 ENCOUNTER — Inpatient Hospital Stay (HOSPITAL_COMMUNITY): Payer: Self-pay | Admitting: Physical Therapy

## 2015-07-15 LAB — CREATININE, SERUM
Creatinine, Ser: 0.99 mg/dL (ref 0.61–1.24)
GFR calc Af Amer: >60 mL/min (ref >60)
GFR calc non Af Amer: >60 mL/min (ref >60)

## 2015-07-15 MED ORDER — TIZANIDINE HCL 2 MG PO TABS
2.0000 mg | ORAL_TABLET | Freq: Three times a day (TID) | ORAL | Status: DC
  Administered 2015-07-15 – 2015-07-16 (×3): 2 mg via ORAL
  Filled 2015-07-15 (×3): qty 1

## 2015-07-15 NOTE — Progress Notes (Signed)
Speech Language Pathology Daily Session Note  Patient Details  Name: Gerald [name] MRN: [id number] Date of Birth: 18-Sep-1960  Today's Date: 07/15/2015 SLP Individual Time: [id number] SLP Individual Time Calculation (min): 26 min  Short Term Goals: Week 1: SLP Short Term Goal 1 (Week 1): Pt will demonstrate complete naming tasks with 80% acc min A.  SLP Short Term Goal 2 (Week 1): Pt will demonstrate sentence comprehension with 80% acc with min A. SLP Short Term Goal 3 (Week 1): Pt will follow 3-step or complex directives with 75% acc min A. SLP Short Term Goal 4 (Week 1): Pt will communicate basic needs/wants via any means with min A. SLP Short Term Goal 5 (Week 1): Pt will intiate verbal responses with min visual/verbal cueing with 75% acc. SLP Short Term Goal 6 (Week 1): Pt will demonstrate recogition of 3-5 objects given a 5 min delay with min A.  Skilled Therapeutic Interventions:  Pt was seen for skilled ST targeting communication goals.  Pt required max faded to min assist verbal cues to sequence 4 step picture cards for 80% accuracy.  Pt was then able to describe actions in pictures with short phrases with mod assist verbal cues.  Pt was also able to identify and describe safety issues in pictures at the phrase level for ~90% accuracy with mod assist verbal cues to monitor and correct verbal errors.  Pt was left in wheelchair with quick release belt donned and call bell left within reach.    Function:  Eating Eating                 Cognition Comprehension Comprehension assist level: Understands basic 75 - 89% of the time/ requires cueing 10 - 24% of the time  Expression   Expression assist level: Expresses basic 50 - 74% of the time/requires cueing 25 - 49% of the time. Needs to repeat parts of sentences.  Social Interaction Social Interaction assist level: Interacts appropriately 90% of the time - Needs monitoring or encouragement for participation or interaction.  Problem  Solving Problem solving assist level: Solves basic 25 - 49% of the time - needs direction more than half the time to initiate, plan or complete simple activities  Memory Memory assist level: Recognizes or recalls 25 - 49% of the time/requires cueing 50 - 75% of the time    Pain Pain Assessment Pain Assessment: No/denies pain  Therapy/Group: Individual Therapy  Staphanie Harbison, [name] 07/15/2015, 4:00 PM

## 2015-07-15 NOTE — Progress Notes (Signed)
Speech Language Pathology Daily Session Note  Patient Details  Name: Gerald [name] MRN: [id number] Date of Birth: 1960-09-03  Today's Date: 07/15/2015 SLP Individual Time: 1000-1100 SLP Individual Time Calculation (min): 60 min  Short Term Goals: Week 1: SLP Short Term Goal 1 (Week 1): Pt will demonstrate complete naming tasks with 80% acc min A.  SLP Short Term Goal 2 (Week 1): Pt will demonstrate sentence comprehension with 80% acc with min A. SLP Short Term Goal 3 (Week 1): Pt will follow 3-step or complex directives with 75% acc min A. SLP Short Term Goal 4 (Week 1): Pt will communicate basic needs/wants via any means with min A. SLP Short Term Goal 5 (Week 1): Pt will intiate verbal responses with min visual/verbal cueing with 75% acc. SLP Short Term Goal 6 (Week 1): Pt will demonstrate recogition of 3-5 objects given a 5 min delay with min A.  Skilled Therapeutic Interventions:  Pt was seen for skilled ST targeting communication goals.  SLP facilitated the session with a verbal description task targeting word finding and verbal expression at the phrase level.  Pt required max assist verbal cues to described basic objects; however, he was able to name basic objects from description with overall min-mod assist verbal cues.  Pt was able to copy words and then progressed to writing words from memory with max assist for 5/9 accuracy with the word "kite," 3/9 accuracy for the word "juice," and 1/8 accuracy for the word "picnic."  All words corresponded with images in verbal description task from [place].  Pt was also able to write his name with 12/13 accuracy.  Pt was returned to room and left with quick release belt donned and call bell left within reach.    Function:  Eating Eating                 Cognition Comprehension Comprehension assist level: Understands basic 75 - 89% of the time/ requires cueing 10 - 24% of the time  Expression   Expression assist level:  Expresses basic 50 - 74% of the time/requires cueing 25 - 49% of the time. Needs to repeat parts of sentences.  Social Interaction Social Interaction assist level: Interacts appropriately 90% of the time - Needs monitoring or encouragement for participation or interaction.  Problem Solving Problem solving assist level: Solves basic 25 - 49% of the time - needs direction more than half the time to initiate, plan or complete simple activities  Memory Memory assist level: Recognizes or recalls 25 - 49% of the time/requires cueing 50 - 75% of the time    Pain Pain Assessment Pain Assessment: No/denies pain  Therapy/Group: Individual Therapy  Hadassah Rana, [name] 07/15/2015, 10:57 AM

## 2015-07-15 NOTE — Progress Notes (Signed)
Physical Therapy Note  Patient Details  Name: Gerald [name] MRN: [id number] Date of Birth: [birth year] Today's Date: 07/15/2015    Time: 1400-1456 56 minutes  1:1 No c/o pain. Pt propelled w/c to gym with min A and increased time for steering/problem solving with obstacle negotiation.  Sliding board transfers with max A to Lt, +2 assist to Rt due to poor trunk control and sitting balance with wt shifting to slide.  Standing NMR with focus on reaching to Lt for Rt knee and hip extension and midline positioning. Pt requires max A for standing balance, tactile cues for Rt knee extension and manual facilitation for wt shifts to Lt. Pt with much difficulty crossing midline to Lt with wt shifts.  Gait in hallway at [place] with max A and close chair follow.  Max A for Rt LE swing, wt shifts to Lt and trunk control and positioning.  Pt improved with repetitions and improved motor planning and sequencing throughout.  Pt supervision to propel w/c to room at end of session including backing in to spot beside the bed.  Pt indicates he does not like Rt arm trough because it is too much of a stretch, encouraged pt to try to keep his arm in the trough as much as tolerated without pain.   Buffi Ewton 07/15/2015, 3:04 PM

## 2015-07-15 NOTE — Progress Notes (Signed)
Physical Therapy Weekly Progress Note  Patient Details  Name: KYRELL RUACHO MRN: [id number] Date of Birth: 1960-11-10  Beginning of progress report period: July 08, 2015 End of progress report period: July 15, 2015   Patient has met 0 of 3 short term goals. Pt limited by poor motor planning, proprioception, balance and Rt sided weakness with increased tone in R UE.  Pt continues to require +2 for transfers and gait.  Patient continues to demonstrate the following deficits: decreased strength, impaired balance and motor planning and therefore will continue to benefit from skilled PT intervention to enhance overall performance with activity tolerance, balance, postural control, ability to compensate for deficits, functional use of  right upper extremity and right lower extremity, awareness and coordination.  Patient not progressing toward long term goals.  See goal revision..  Continue plan of care.  PT Short Term Goals Week 1:  PT Short Term Goal 1 (Week 1): pt will transfer consistently with assist of 1 PT Short Term Goal 1 - Progress (Week 1): Progressing toward goal PT Short Term Goal 2 (Week 1): pt will propel w/c x 50' with supervision PT Short Term Goal 2 - Progress (Week 1): Progressing toward goal PT Short Term Goal 3 (Week 1): pt will perform sit>stand with asssit of 1 PT Short Term Goal 3 - Progress (Week 1): Progressing toward goal Week 2:  PT Short Term Goal 1 (Week 2): pt will perform functional transfers with max A PT Short Term Goal 2 (Week 2): Pt will perform sit to stand with max A PT Short Term Goal 3 (Week 2): pt will perform bed mobility with mod A  Skilled Therapeutic Interventions/Progress Updates:  Ambulation/gait training;Balance/vestibular training;Cognitive remediation/compensation;Community reintegration;Discharge planning;Neuromuscular re-education;Functional mobility training;Functional electrical stimulation;DME/adaptive equipment instruction;Pain  management;Patient/family education;Psychosocial support;Splinting/orthotics;UE/LE Coordination activities;UE/LE Strength taining/ROM;Therapeutic Exercise;Therapeutic Activities;Stair training;Wheelchair propulsion/positioning     See Function Navigator for Current Functional Status.   Jamorris Ndiaye 07/15/2015, 7:29 AM

## 2015-07-15 NOTE — Progress Notes (Signed)
Occupational Therapy Session Note  Patient Details  Name: Gerald [name] MRN: [id number] Date of Birth: Jun 21, 1960  Today's Date: 07/15/2015 OT Individual Time: [id number] Individual Time Calculation (min): 89 min    Short Term Goals: Week 1:  OT Short Term Goal 1 (Week 1): Pt will maintain dynamic sitting balance during bathing tasks with mod assist. OT Short Term Goal 2 (Week 1): Pt will complete UB dressing in supported sitting with supervision. OT Short Term Goal 3 (Week 1): Pt will complete LB dressing with mod assist sit to stand. OT Short Term Goal 4 (Week 1): Pt will use the RUE as a stabilizer with mod assist during grooming and bathing tasks.    Skilled Therapeutic Interventions/Progress Updates:    Bathing and dressing session in the room this am.  Pt incontinent of bladder and stool so cleaned up peri area in sidelying in the bed before getting up secondary to severity.  Pt needed max assist for transition to sitting from right sidelying.  Once in sitting, completed transfer squat pivot to the wheelchair with mod assist to the left side.  Pt needed max assist for stand pivot transfers in and out of the shower.  Max assist for use of the RUE throughout session to wash the LUE.  He needed max assist for sit to stand and standing when washing peri area.  Increased LOB noted to the right side when reaching down toward his feet or across his body.  Max demonstrational cueing for hemi dressing techniques, with little carryover from previous sessions.  Motor planning deficits noted when attempting to apply toothpaste to his toothbrush or when attempting to apply soap to washcloth.  Finished session with self AAROM exercises for the shoulder, elbow, and hand in the RUE.  Pt left with safety belt in place and call button in reach.    Therapy Documentation Precautions:  Precautions Precautions: Fall Precaution Comments: dense right hemiparesis Restrictions Weight Bearing Restrictions:  No  Vital Signs: Therapy Vitals Temp: 97.7 F (36.5 C) Temp Source: Oral Pulse Rate: 72 Resp: 19 BP: 136/72 mmHg Patient Position (if appropriate): Lying Oxygen Therapy SpO2: 93 % O2 Device: Not Delivered Pulse Oximetry Type: Intermittent Pain: Pain Assessment Pain Assessment: No/denies pain ADL: See Function Navigator for Current Functional Status.   Therapy/Group: Individual Therapy  Lovie Zarling OTR/L 07/15/2015, 9:36 AM

## 2015-07-15 NOTE — Progress Notes (Addendum)
Subjective/Complaints: No c/os but RUE and RLE in flexor withdrawal position, responses delayed ROS- limited by cognition and speech Objective: Vital Signs: Blood pressure 121/74, pulse 80, temperature 97.7 F (36.5 C), temperature source Oral, resp. rate 19, height 6' (1.829 m), weight 77.61 kg (171 lb 1.6 oz), SpO2 99 %. No results found. Results for orders placed or performed during the hospital encounter of 07/08/15 (from the past 72 hour(s))  Creatinine, serum     Status: None   Collection Time: 07/15/15  5:55 AM  Result Value Ref Range   Creatinine, Ser 0.99 0.61 - 1.24 mg/dL   GFR calc non Af Amer >60 >60 mL/min   GFR calc Af Amer >60 >60 mL/min    Comment: (NOTE) The eGFR has been calculated using the CKD EPI equation. This calculation has not been validated in all clinical situations. eGFR's persistently <60 mL/min signify possible Chronic Kidney Disease.      HEENT: normal and poor dentition Cardio: RRR and no murmur Resp: RRR and no murmur GI: BS positive and NT, ND Extremity:  Pulses positive and No Edema Skin:   Intact Neuro: oriented to person and G[place] not [place] or time, Flat, Abnormal Sensory absent LT in RUE and RLE, intact on left side, Abnormal Motor 2- R arm ext, 2-/5 grip, 0/5 finger and wrist ext, trace R hip ext, Dysarthric and Other naming intact but decreased fluency Musc/Skel:  Other no pain with UE or LE ROM Gen NAD   Assessment/Plan: 1. Functional deficits secondary to Left ACA infarct with Right HP which require 3+ hours per day of interdisciplinary therapy in a comprehensive inpatient rehab setting. Physiatrist is providing close team supervision and 24 hour management of active medical problems listed below. Physiatrist and rehab team continue to assess barriers to discharge/monitor patient progress toward functional and medical goals. FIM: Function - Bathing Position: Shower Body parts bathed by patient: Chest, Right arm, Abdomen, Front  perineal area, Right upper leg, Left upper leg Body parts bathed by helper: Left arm, Buttocks, Right lower leg, Left lower leg, Back Assist Level: Touching or steadying assistance(Pt > 75%)  Function- Upper Body Dressing/Undressing What is the patient wearing?: Pull over shirt/dress Pull over shirt/dress - Perfomed by patient: Thread/unthread left sleeve, Put head through opening Pull over shirt/dress - Perfomed by helper: Thread/unthread right sleeve, Pull shirt over trunk Assist Level: Touching or steadying assistance(Pt > 75%) Function - Lower Body Dressing/Undressing What is the patient wearing?: Pants, Non-skid slipper socks, Underwear Position: Wheelchair/chair at sink Underwear - Performed by patient: Thread/unthread left underwear leg Underwear - Performed by helper: Thread/unthread right underwear leg, Pull underwear up/down Pants- Performed by patient: Thread/unthread left pants leg Pants- Performed by helper: Thread/unthread right pants leg, Pull pants up/down Non-skid slipper socks- Performed by helper: Don/doff right sock, Don/doff left sock Socks - Performed by patient: Don/doff right sock, Don/doff left sock Shoes - Performed by patient: Don/doff left shoe, Fasten right, Fasten left (utilized shoe buttons) Shoes - Performed by helper: Don/doff right shoe Assist for footwear: Supervision/touching assist Assist for lower body dressing: 2 Helpers  Function - Toileting Toileting activity did not occur: No continent bowel/bladder event Toileting steps completed by helper: Adjust clothing prior to toileting, Performs perineal hygiene, Adjust clothing after toileting Assist level:  (total A)  Function - Air cabin crew transfer activity did not occur: Safety/medical concerns Toilet transfer assistive device: Elevated toilet seat/BSC over toilet Assist level to toilet: Maximal assist (Pt 25 - 49%/lift and lower) Assist  level from toilet: Maximal assist (Pt 25 -  49%/lift and lower) Assist level to bedside commode (at bedside): 2 helpers Assist level from bedside commode (at bedside): 2 helpers  Function - Chair/bed transfer Chair/bed transfer method: Lateral scoot Chair/bed transfer assist level: 2 helpers Chair/bed transfer assistive device: Sliding board Chair/bed transfer details: Verbal cues for technique, Visual cues for safe use of DME/AE, Manual facilitation for weight bearing, Manual facilitation for weight shifting  Function - Locomotion: Wheelchair Will patient use wheelchair at discharge?: Yes Type: Manual Max wheelchair distance: 50 Assist Level: Touching or steadying assistance (Pt > 75%) Wheel 50 feet with 2 turns activity did not occur: Safety/medical concerns (fatigue) Assist Level: Touching or steadying assistance (Pt > 75%) Wheel 150 feet activity did not occur: Safety/medical concerns (fatigue) Turns around,maneuvers to table,bed, and toilet,negotiates 3% grade,maneuvers on rugs and over doorsills: No Function - Locomotion: Ambulation Ambulation activity did not occur: Safety/medical concerns (lack of sensation) Assistive device: Rail in hallway Max distance: 22 Assist level: Maximal assist (Pt 25 - 49%) Walk 10 feet activity did not occur: Safety/medical concerns Assist level: Maximal assist (Pt 25 - 49%) Walk 50 feet with 2 turns activity did not occur: Safety/medical concerns Walk 150 feet activity did not occur: Safety/medical concerns Walk 10 feet on uneven surfaces activity did not occur: Safety/medical concerns  Function - Comprehension Comprehension: Auditory Comprehension assist level: Understands basic 75 - 89% of the time/ requires cueing 10 - 24% of the time  Function - Expression Expression: Verbal Expression assist level: Expresses basic 50 - 74% of the time/requires cueing 25 - 49% of the time. Needs to repeat parts of sentences.  Function - Social Interaction Social Interaction assist level: Interacts  appropriately 90% of the time - Needs monitoring or encouragement for participation or interaction.  Function - Problem Solving Problem solving assist level: Solves basic 25 - 49% of the time - needs direction more than half the time to initiate, plan or complete simple activities  Function - Memory Memory assist level: Recognizes or recalls 25 - 49% of the time/requires cueing 50 - 75% of the time Patient normally able to recall (first 3 days only): Current season, Staff names and faces, That he or she is in a hospital  Medical Problem List and Plan: 1.  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  DVT Prophylaxis/Anticoagulation: Subcutaneous Lovenox. Monitor platelet counts and any signs of bleeding, plt [id number] 3. Pain Management: Tylenol as needed. Pain controlled at present 4. Hypertension. Norvasc 5 mg daily, lisinopril 20 mg daily. 121/74 Monitor with increased mobility 5. Neuropsych: This patient is capable of making decisions on his own behalf. 6. Skin/Wound Care: Routine skin checks. OOB. Encourage appropriate nutrition 7. Fluids/Electrolytes/Nutrition: Routine I&O's with follow-up chemistries in AM 8. Hyperlipidemia. Lipitor 9. Tobacco/ cocaine/alcohol abuse. Provide counseling during rehab stay-  10.  Hypoalbuminemia- mild will supplement po  LOS (Days) 7 A FACE TO FACE EVALUATION WAS PERFORMED  Klaudia Beirne E 07/15/2015, 7:14 AM

## 2015-07-16 ENCOUNTER — Inpatient Hospital Stay (HOSPITAL_COMMUNITY): Payer: Medicaid Other | Admitting: Occupational Therapy

## 2015-07-16 ENCOUNTER — Inpatient Hospital Stay (HOSPITAL_COMMUNITY): Payer: Medicaid Other

## 2015-07-16 ENCOUNTER — Inpatient Hospital Stay (HOSPITAL_COMMUNITY): Payer: Medicaid Other | Admitting: Speech Pathology

## 2015-07-16 DIAGNOSIS — R569 Unspecified convulsions: Secondary | ICD-10-CM

## 2015-07-16 HISTORY — DX: Unspecified convulsions: R56.9

## 2015-07-16 MED ORDER — TIZANIDINE HCL 4 MG PO TABS
4.0000 mg | ORAL_TABLET | Freq: Three times a day (TID) | ORAL | Status: DC
  Administered 2015-07-16 – 2015-07-23 (×21): 4 mg via ORAL
  Filled 2015-07-16 (×21): qty 1

## 2015-07-16 NOTE — Progress Notes (Signed)
Occupational Therapy Session Note  Patient Details  Name: Gerald [name] MRN: [id number] Date of Birth: 06/03/1961  Today's Date: 07/16/2015 OT Individual Time: 0930-1030 OT Individual Time Calculation (min): 60 min    Short Term Goals: Week 1:  OT Short Term Goal 1 (Week 1): Pt will maintain dynamic sitting balance during bathing tasks with mod assist. OT Short Term Goal 2 (Week 1): Pt will complete UB dressing in supported sitting with supervision. OT Short Term Goal 3 (Week 1): Pt will complete LB dressing with mod assist sit to stand. OT Short Term Goal 4 (Week 1): Pt will use the RUE as a stabilizer with mod assist during grooming and bathing tasks.    Skilled Therapeutic Interventions/Progress Updates:    Pt rolled himself down to the therapy gym with increased time and supervision.  Pt at times running into objects on the right side secondary to decreased motor planning when attempting to push the wheelchair.  Once in the gym he needed max assist for stand pivot transfer to the EOM.  Worked on dynamic sitting balance EOM during session, by having pt reaching for objects with the LUE while maintaining weightbearing on the RUE.  Pt with frequent LOB to the right without any activation of the right UE or trunk to self correct without max assist.  Worked in supine as well on rolling side to side in order to increase abdominal activation.  Pt needs max assist for flexing up the right knee in order to initiate rolling.  Encouraged head flexion to activate abdominals.  Pt needing max assist for rolling to the right and supervision with mod instructional cueing for rolling to the left.  Pt transitioned from sidelying to sitting on the left side with min assist.  Transferred back to the wheelchair with max assist stand pivot and transitioned to next session with speech therapy.      Therapy Documentation Precautions:  Precautions Precautions: Fall Precaution Comments: dense right  hemiparesis Restrictions Weight Bearing Restrictions: No  Pain: Pain Assessment Pain Assessment: No/denies pain ADL: See Function Navigator for Current Functional Status.   Therapy/Group: Individual Therapy  Arlayne Liggins OTR/L 07/16/2015, 12:16 PM

## 2015-07-16 NOTE — Progress Notes (Addendum)
Social Work Patient ID: Gerald [name], male   DOB: 1960/09/17, 55 y.o.   MRN: [id number] Met witth pt and sister-Jolana who was here to ask about his progress. Informed of team conference goals-min assist level and target discharge 2/22. Aware he will require 24 hr physical care at discharge. She asked what would prevent him form going home. Informed only issue would be if family could not provide the  Care pt required at discharge. Will ask them to come in and then begin doing hands on when appropriate. [name]-Girllfriend has been in over the weekend and attended therapies with him. Pt is working hard in therapies and hopes to do better than team thinks he will. Will discuss with [name] and work on discharge plans. Jolana to contact [place] counselor to set up disability and Medicaid applications.

## 2015-07-16 NOTE — Patient Care Conference (Signed)
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 Right hemiparesis ([place]) 07/08/2015  . Acute left ACA ischemic stroke ([place]) 07/08/2015  . Cerebrovascular accident (CVA) ([place])   . Stroke ([place]) 07/02/2015  . Acute ischemic stroke ([place]) 07/02/2015  . CVA (cerebral infarction) 01/15/2013  . Essential hypertension 01/15/2013  . Nicotine dependence 01/15/2013  . Alcohol abuse 01/15/2013    Expected Discharge Date: Expected Discharge Date: 08/06/15  Team Members Present: Physician leading conference: Dr. Alysia Penna Social Worker Present: Ovidio Kin, LCSW Nurse Present: Dorien Chihuahua, RN PT Present: Georjean Mode, PT OT Present: Clyda Greener, OT SLP Present: Windell Moulding, SLP PPS Coordinator present : Daiva Nakayama, RN, CRRN     Current Status/Progress Goal Weekly Team Focus  Medical   Increasing spasticity right upper extremity as well as right lower extremity., Dysarthria, right upper extremity strength improving  Home versus SNF depending on caregiver availability  Continue rehabilitation program, adjust medications for spasticity   Bowel/Bladder   Incontinent of bowel and bladder. LBM 07/15/15  Managed bowel and bladder  Continue  w/timed toileting   Swallow/Nutrition/ Hydration     na        ADL's   Min assist for UB bathing, mod assist for LB bathing sit to stand, max assist for dynamic sitting balance with pt losing balance to the right without any active trunk correction secondary to motor planning deficits.  Min assist UB dressing with max assist for LB dressing sit to stand.  RUE apraxia with pt demonstrating inconsistent movement.  At times he can complete full gross grasp and release but other times only grasp.  Needs max assist to incorporate into ADL session.   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 EPBC to area  No additional skin breakdown  Encourage positional change q 2hrs      *See Care Plan and progress notes for long and short-term goals.  Barriers to Discharge: Severe right lower extremity weakness history of medication noncompliance, history of drug abuse    Possible  Resolutions to Barriers:  Continue rehabilitation    Discharge Planning/Teaching Needs:  Home with sister and girlfriend between both of them they can provide 24 hr care. Sister to stay with while girlfriend is working      Team Discussion:  Goals may be too lofty currently min assist level but may need to downgrade to mod  assist level. Timed toileting per RN to assist with his continency. Muscle spasms and tone have increased zanaflex. Poor motor planning. Plus 2 for gait currently. Alien hand. See if family can provide this level of care. Apraxia barrier for words.   Revisions to Treatment Plan:  Possibly downgrade goals will wait until next week's conference.   Continued Need for Acute Rehabilitation Level of Care: The patient requires daily medical management by a physician with specialized training in physical medicine and rehabilitation for the following conditions: Daily direction of a multidisciplinary physical rehabilitation program to ensure safe treatment while eliciting the highest outcome that is of practical value to the patient.: Yes Daily medical management of patient stability for increased activity during participation in an intensive rehabilitation regime.: Yes Daily analysis of laboratory values and/or radiology reports with any subsequent need for medication adjustment of medical intervention for : Neurological problems;Mood/behavior problems  Willodene Stallings, Gardiner Rhyme 07/16/2015, 1:05 PM

## 2015-07-16 NOTE — Progress Notes (Signed)
Occupational Therapy Weekly Progress Note  Patient Details  Name: Gerald [name] MRN: [id number] Date of Birth: Jun 20, 1960  Beginning of progress report period: July 09, 2015 End of progress report period: July 16, 2015  Today's Date: 07/16/2015 OT Individual Time: 0026-2854 OT Individual Time Calculation (min): 28 min        Patient has met 0 of 4 short term goals.  Mr. Misner is making slower progress than expected with OT at this time.  He currently still needs max assist for stand pivot transfers during toileting and showering.  Decreased trunk control for dynamic sitting balance during ADLs.  He continues to need max assist for sitting balance with bathing unsupported.  Brunnstrum stage II movement in the RUE and stage V in the hand.  He is able to exhibit full gross digit flexion and extension at times, but it is inconsistent secondary to motor apraxia.  When an object is placed in his hand he continues to squeeze it tighter if you try to remove the object.  This is not voluntary.  Mod demonstrational cueing and mod to max assist is still required for hemi dressing techniques as well.  Feel he continues to need comprehensive OT to address these deficits.  Anticipate pt will likely need mod to min assist at discharge.  Current goals overall min assist level.  Plan to downgrade based on slower progress than expected.    Patient continues to demonstrate the following deficits: decreased balance, decreased RUE function, decreased motor planning,  and therefore will continue to benefit from skilled OT intervention to enhance overall performance with BADL.  Patient not progressing toward long term goals.  See goal revision..  Plan of care revisions: some LTGs downgraded secondary to slower progress than expected.  OT Short Term Goals Week 2:  OT Short Term Goal 1 (Week 2): Pt will maintain dynamic sitting balance during bathing tasks with mod assist. OT Short Term Goal 2 (Week 2): Pt  will complete UB dressing in supported sitting with supervision. OT Short Term Goal 3 (Week 2): Pt will complete LB dressing with mod assist sit to stand. OT Short Term Goal 4 (Week 2): Pt will use the RUE as a stabilizer with mod assist during grooming and bathing tasks.   OT Short Term Goal 5 (Week 2): Pt will perform toilet transfer to the [facility] with mod assist stand pivot.  Skilled Therapeutic Interventions/Progress Updates:    Mr. Lesiak was found soiled of urine in bed at start of session.  Worked on transferring out of bed and to the sink to work on cleaning up peri area, donning new brief, and donning new pair of pants.  Max assist needed for transfer stand pivot to the wheelchair with max assist for sit to stand and standing balance while he washed his peri area.  Max assist needed for donning new brief as well as for donning pants and shoes.  Pt voices that he can tell when he needs to go to the bathroom, however on multiple occasions this therapist has found him soiled of bowel and bladder without him stating any awareness of it.  Pt left in wheelchair with safety belt and call button in place.  Therapy Documentation Precautions:  Precautions Precautions: Fall Precaution Comments: dense right hemiparesis Restrictions Weight Bearing Restrictions: No  Pain: Pain Assessment Pain Assessment: No/denies pain ADL: See Function Navigator for Current Functional Status.   Therapy/Group: Individual Therapy  Shaquanda Graves OTR/L 07/16/2015, 2:20 PM

## 2015-07-16 NOTE — Progress Notes (Signed)
Speech Language Pathology Weekly Progress and Session Note  Patient Details  Name: Gerald [name] MRN: [id number] Date of Birth: 04-Oct-1960  Beginning of progress report period: July 09, 2015   End of progress report period: July 16, 2015     Short Term Goals: Week 1: SLP Short Term Goal 1 (Week 1): Pt will demonstrate complete naming tasks with 80% acc min A.  SLP Short Term Goal 1 - Progress (Week 1): Met SLP Short Term Goal 2 (Week 1): Pt will demonstrate sentence comprehension with 80% acc with min A. SLP Short Term Goal 2 - Progress (Week 1): Met SLP Short Term Goal 3 (Week 1): Pt will follow 3-step or complex directives with 75% acc min A. SLP Short Term Goal 3 - Progress (Week 1): Progressing toward goal SLP Short Term Goal 4 (Week 1): Pt will communicate basic needs/wants via any means with min A. SLP Short Term Goal 4 - Progress (Week 1): Met SLP Short Term Goal 5 (Week 1): Pt will intiate verbal responses with min visual/verbal cueing with 75% acc. SLP Short Term Goal 5 - Progress (Week 1): Met SLP Short Term Goal 6 (Week 1): Pt will demonstrate recogition of 3-5 objects given a 5 min delay with min A. SLP Short Term Goal 6 - Progress (Week 1): Progressing toward goal    New Short Term Goals: Week 2: SLP Short Term Goal 1 (Week 2): Pt will demonstrate sentence comprehension with 80% acc with supervision  SLP Short Term Goal 2 (Week 2): Pt will follow 3-step directives with 75% acc min A. SLP Short Term Goal 3 (Week 2): Pt will communicate basic needs/wants via phrase level verbal expression with mod  A. SLP Short Term Goal 4 (Week 2): Pt will demonstrate recogition of 3-5 objects given a 5 min delay with min A.  Weekly Progress Updates:  Pt made functional gains this reporting period and has met 4 out of 6 short term goals.  Pt is currently at [place] assist for basic functional communication.  Verbal expression is limited primarily to the word level at this time but  pt has demonstrated some phrase level communication during structured tasks.  Pt can follow 1 and 2 step commands in a functional context but continues to have impairments for following 3 step commands.  Pt would continue to benefit from skilled ST while inpatient in order to maximize functional independence and reduce burden of care prior to discharge.   Continue to recommend that pt have 24/7 supervision at discharge in addition to [place] follow up at next level of care.     Intensity: Minumum of 1-2 x/day, 30 to 90 minutes Frequency: 3 to 5 out of 7 days Duration/Length of Stay: 21 days Treatment/Interventions: Cognitive remediation/compensation;Multimodal communication approach;Speech/Language facilitation;Functional tasks;Therapeutic Activities;Therapeutic Exercise;Patient/family education;Medication managment;Environmental controls;Cueing hierarchy    Sherel Fennell, [name] 07/16/2015, 11:11 AM

## 2015-07-16 NOTE — Progress Notes (Signed)
Physical Therapy Session Note  Patient Details  Name: Gerald [name] MRN: [id number] Date of Birth: [birth year]  Today's Date: 07/16/2015 PT Individual Time: 0800-0900 PT Individual Time Calculation (min): 60 min   Short Term Goals: Week 2:  PT Short Term Goal 1 (Week 2): pt will perform functional transfers with max A PT Short Term Goal 2 (Week 2): Pt will perform sit to stand with max A PT Short Term Goal 3 (Week 2): pt will perform bed mobility with mod A  Skilled Therapeutic Interventions/Progress Updates:  neuromuscular re-education via manual cues, VCs, visual feedback for R hip PROM in supine.  Pt with hypertonus LLE; MD aware and has started pt on meds.  Bed mobility with VCs, tactile cues for rolling.  Pt sat EOB x 5 minutes with LUE support far to L to prevent pushing. On mat in gym, pt worked on midline orientation with wedge under hips to decrease posterior pelvic tilt, reaching with L hand out of BOS and slightly past midline to R to facilitate trunk shortening/lengthening/rotating .  Pt had LOB R at least 50% of trials.  Slide board transfer to L with mod/max assist. Pt noted to be wet and soiled' pt unaware.  +2 for hygiene in standing x 5 minutes with L hand on back or high armchair to L.  W/c propulsion using hemi method x 150' with mod cues and use of visual target, supervision.  RT returned pt to room.    Therapy Documentation Precautions:  Precautions Precautions: Fall Precaution Comments: dense right hemiparesis, motor planning problems Restrictions Weight Bearing Restrictions: No   Vital Signs: Therapy Vitals Pulse Rate: 85 BP: 124/71 mmHg Patient Position (if appropriate): Lying Oxygen Therapy SpO2: 98 % O2 Device: Not Delivered Pain: Pain Assessment Pain Assessment: No/denies pain      See Function Navigator for Current Functional Status.   Therapy/Group: Individual Therapy  Gerald [name] 07/16/2015, 10:12 AM

## 2015-07-16 NOTE — Progress Notes (Signed)
Subjective/Complaints: Increasing spasms RUE and RLE but cannot tell me position of arm or leg during spasms ROS- limited by cognition and speech Objective: Vital Signs: Blood pressure 109/64, pulse 72, temperature 98.4 F (36.9 C), temperature source Oral, resp. rate 18, height 6' (1.829 m), weight 77.61 kg (171 lb 1.6 oz), SpO2 98 %. No results found. Results for orders placed or performed during the hospital encounter of 07/08/15 (from the past 72 hour(s))  Creatinine, serum     Status: None   Collection Time: 07/15/15  5:55 AM  Result Value Ref Range   Creatinine, Ser 0.99 0.61 - 1.24 mg/dL   GFR calc non Af Amer >60 >60 mL/min   GFR calc Af Amer >60 >60 mL/min    Comment: (NOTE) The eGFR has been calculated using the CKD EPI equation. This calculation has not been validated in all clinical situations. eGFR's persistently <60 mL/min signify possible Chronic Kidney Disease.      HEENT: normal and poor dentition Cardio: RRR and no murmur Resp: RRR and no murmur GI: BS positive and NT, ND Extremity:  Pulses positive and No Edema Skin:   Intact Neuro: oriented to person and GBO not [place] or time, Flat, Abnormal Sensory absent LT in RUE and RLE, intact on left side, Abnormal Motor 2- R arm ext, 2-/5 grip, 0/5 finger and wrist ext, trace R hip ext, Dysarthric and Other naming intact but decreased fluency, no flexor withdrawl Musc/Skel:  Other no pain with UE or LE ROM Gen NAD   Assessment/Plan: 1. Functional deficits secondary to Left ACA infarct with Right HP which require 3+ hours per day of interdisciplinary therapy in a comprehensive inpatient rehab setting. Physiatrist is providing close team supervision and 24 hour management of active medical problems listed below. Physiatrist and rehab team continue to assess barriers to discharge/monitor patient progress toward functional and medical goals. FIM: Function - Bathing Position: Shower Body parts bathed by patient:  Chest, Right arm, Abdomen, Front perineal area, Right upper leg, Left upper leg Body parts bathed by helper: Left arm, Buttocks, Right lower leg, Left lower leg, Back Assist Level: Touching or steadying assistance(Pt > 75%)  Function- Upper Body Dressing/Undressing What is the patient wearing?: Pull over shirt/dress Pull over shirt/dress - Perfomed by patient: Thread/unthread left sleeve, Put head through opening, Pull shirt over trunk Pull over shirt/dress - Perfomed by helper: Thread/unthread right sleeve, Pull shirt over trunk Assist Level: Touching or steadying assistance(Pt > 75%) Function - Lower Body Dressing/Undressing What is the patient wearing?: Pants, Non-skid slipper socks, Shoes, Ted Hose Position: Wheelchair/chair at [employer] - Performed by patient: Thread/unthread left underwear leg Underwear - Performed by helper: Thread/unthread right underwear leg, Pull underwear up/down Pants- Performed by patient: Thread/unthread left pants leg Pants- Performed by helper: Thread/unthread right pants leg, Pull pants up/down Non-skid slipper socks- Performed by helper: Don/doff right sock, Don/doff left sock Socks - Performed by patient: Don/doff right sock, Don/doff left sock Shoes - Performed by patient: Don/doff left shoe Shoes - Performed by helper: Don/doff right shoe, Fasten right, Fasten left TED Hose - Performed by helper: Don/doff right TED hose, Don/doff left TED hose Assist for footwear: Supervision/touching assist Assist for lower body dressing: 2 Helpers  Function - Toileting Toileting activity did not occur: No continent bowel/bladder event Toileting steps completed by helper: Adjust clothing prior to toileting, Performs perineal hygiene, Adjust clothing after toileting Assist level:  (total A)  Function - Air cabin crew transfer activity did not occur:  Safety/medical concerns Toilet transfer assistive device: Elevated toilet seat/BSC over toilet Assist  level to toilet: Maximal assist (Pt 25 - 49%/lift and lower) Assist level from toilet: Maximal assist (Pt 25 - 49%/lift and lower) Assist level to bedside commode (at bedside): 2 helpers Assist level from bedside commode (at bedside): 2 helpers  Function - Chair/bed transfer Chair/bed transfer method: Lateral scoot Chair/bed transfer assist level: 2 helpers Chair/bed transfer assistive device: Sliding board Chair/bed transfer details: Verbal cues for technique, Visual cues for safe use of DME/AE, Manual facilitation for weight bearing, Manual facilitation for weight shifting  Function - Locomotion: Wheelchair Will patient use wheelchair at discharge?: Yes Type: Manual Max wheelchair distance: 50 Assist Level: Touching or steadying assistance (Pt > 75%) Wheel 50 feet with 2 turns activity did not occur: Safety/medical concerns (fatigue) Assist Level: Touching or steadying assistance (Pt > 75%) Wheel 150 feet activity did not occur: Safety/medical concerns (fatigue) Turns around,maneuvers to table,bed, and toilet,negotiates 3% grade,maneuvers on rugs and over doorsills: No Function - Locomotion: Ambulation Ambulation activity did not occur: Safety/medical concerns (lack of sensation) Assistive device: Rail in hallway Max distance: 22 Assist level: Maximal assist (Pt 25 - 49%) Walk 10 feet activity did not occur: Safety/medical concerns Assist level: Maximal assist (Pt 25 - 49%) Walk 50 feet with 2 turns activity did not occur: Safety/medical concerns Walk 150 feet activity did not occur: Safety/medical concerns Walk 10 feet on uneven surfaces activity did not occur: Safety/medical concerns  Function - Comprehension Comprehension: Auditory Comprehension assist level: Understands basic 75 - 89% of the time/ requires cueing 10 - 24% of the time  Function - Expression Expression: Verbal Expression assist level: Expresses basic 50 - 74% of the time/requires cueing 25 - 49% of the time.  Needs to repeat parts of sentences.  Function - Social Interaction Social Interaction assist level: Interacts appropriately 90% of the time - Needs monitoring or encouragement for participation or interaction.  Function - Problem Solving Problem solving assist level: Solves basic 25 - 49% of the time - needs direction more than half the time to initiate, plan or complete simple activities  Function - Memory Memory assist level: Recognizes or recalls 25 - 49% of the time/requires cueing 50 - 75% of the time Patient normally able to recall (first 3 days only): Current season, Staff names and faces, That he or she is in a hospital  Medical Problem List and Plan: 1.  Right hemiparesis, Right hemisensory deficits and dysarthria secondary to left ACA territory infarct-expect UE>LE recovery ,Team conference today please see physician documentation under team conference tab, met with team face-to-face to discuss problems,progress, and goals. Formulized individual treatment plan based on medical history, underlying problem and comorbidities.                                  2.  DVT Prophylaxis/Anticoagulation: Subcutaneous Lovenox. Monitor platelet counts and any signs of bleeding, plt 302K 3. Pain Management: Tylenol as needed. Pain controlled at present 4. Hypertension. Norvasc 5 mg daily, lisinopril 20 mg daily. 109/64 Monitor with increased mobility 5. Neuropsych: This patient is capable of making decisions on his own behalf. 6. Skin/Wound Care: Routine skin checks. OOB. Encourage appropriate nutrition 7. Fluids/Electrolytes/Nutrition: Routine I&O's with follow-up chemistries in AM 8. Hyperlipidemia. Lipitor 9. Tobacco/ cocaine/alcohol abuse. Provide counseling during rehab stay-  10.  Hypoalbuminemia- mild will supplement po 11. Spasticity R UE and LE now on  Zanaflex,increase to 67m  monitor BP, check orthostatics LOS (Days) 8 A FACE TO FACE EVALUATION WAS PERFORMED  Salia Cangemi  E 07/16/2015, 7:57 AM

## 2015-07-16 NOTE — Progress Notes (Signed)
Speech Language Pathology Daily Session Note  Patient Details  Name: Gerald [name] MRN: [id number] Date of Birth: [birth year]  Today's Date: 07/16/2015 SLP Individual Time: 1030-1131 SLP Individual Time Calculation (min): 61 min  Short Term Goals: Week 2: SLP Short Term Goal 1 (Week 2): Pt will demonstrate sentence comprehension with 80% acc with supervision  SLP Short Term Goal 2 (Week 2): Pt will follow 3-step directives with 75% acc min A. SLP Short Term Goal 3 (Week 2): Pt will communicate basic needs/wants via phrase level verbal expression with mod  A. SLP Short Term Goal 4 (Week 2): Pt will demonstrate recogition of 3-5 objects given a 5 min delay with min A.  Skilled Therapeutic Interventions: Pt was seated upright in wheelchair and participated with min verbal encouragement. Focus this date on increasing utterance length, memory, and error awareness and correction. Pt able to demonstrate naming of objects to description at 80% acc. 3-step directive following at 50% acc due to perseveration on previous directives. Completing written sentences in a field of 3 choices with 8/10 acc. Mod A to answer questions at the sentence level. Consistently required anchor for initiation and visual cueing for further production.   Function:    Cognition Comprehension Comprehension assist level: Understands basic 75 - 89% of the time/ requires cueing 10 - 24% of the time  Expression   Expression assist level: Expresses basic 25 - 49% of the time/requires cueing 50 - 75% of the time. Uses single words/gestures.  Social Interaction Social Interaction assist level: Interacts appropriately 90% of the time - Needs monitoring or encouragement for participation or interaction.  Problem Solving Problem solving assist level: Solves basic 25 - 49% of the time - needs direction more than half the time to initiate, plan or complete simple activities  Memory Memory assist level: Recognizes or recalls 25 - 49%  of the time/requires cueing 50 - 75% of the time    Pain Pain Assessment Pain Assessment: No/denies pain  Therapy/Group: Individual Therapy  Vinetta Bergamo 07/16/2015, 3:39 PM

## 2015-07-17 ENCOUNTER — Inpatient Hospital Stay (HOSPITAL_COMMUNITY): Payer: Medicaid Other | Admitting: Occupational Therapy

## 2015-07-17 ENCOUNTER — Inpatient Hospital Stay (HOSPITAL_COMMUNITY): Payer: Medicaid Other | Admitting: Speech Pathology

## 2015-07-17 ENCOUNTER — Inpatient Hospital Stay (HOSPITAL_COMMUNITY): Payer: Medicaid Other

## 2015-07-17 NOTE — Progress Notes (Signed)
Occupational Therapy Session Note  Patient Details  Name: Gerald [name] MRN: [id number] Date of Birth: 1961/04/06  Today's Date: 07/17/2015 OT Individual Time: [id number] OT Individual Time Calculation (min): 27 min    Skilled Therapeutic Interventions/Progress Updates:    Pt went down to the OT gym and transferred to the therapy mat with max assist.  Had pt work on weightshifting posteriorly and to the right in order to lift his LLE off of the floor.  Pt was able to complete with the LUE supported on mat but demonstrated greater difficulty when attempting to lift the LE off of the floor without the LUE stabilized, such as needed during LB bathing and dressing tasks.  Pt needs mod support in the trunk in order to complete without falling forward.  Pt with motor planning difficulties noted as well.  Returned to room at end of session in wheelchair.  Pt left in wheelchair with call button and safety belt in place.   Therapy Documentation Precautions:  Precautions Precautions: Fall Precaution Comments: dense right hemiparesis Restrictions Weight Bearing Restrictions: No  Vital Signs: Therapy Vitals Temp: 97.8 F (36.6 C) Temp Source: Oral Pulse Rate: 95 Resp: 18 BP: 135/83 mmHg Oxygen Therapy SpO2: 99 % O2 Device: Not Delivered Pain: Pain Assessment Pain Assessment: No/denies pain ADL: See Function Navigator for Current Functional Status.   Therapy/Group: Individual Therapy Jamale Spangler  OTR/L 07/17/2015, 3:54 PM

## 2015-07-17 NOTE — Progress Notes (Signed)
Subjective/Complaints: No pain c/os ROS- limited by cognition and speech Objective: Vital Signs: Blood pressure 114/61, pulse 80, temperature 98.2 F (36.8 C), temperature source Oral, resp. rate 16, height 6' (1.829 m), weight 80.06 kg (176 lb 8 oz), SpO2 97 %. No results found. Results for orders placed or performed during the hospital encounter of 07/08/15 (from the past 72 hour(s))  Creatinine, serum     Status: None   Collection Time: 07/15/15  5:55 AM  Result Value Ref Range   Creatinine, Ser 0.99 0.61 - 1.24 mg/dL   GFR calc non Af Amer >60 >60 mL/min   GFR calc Af Amer >60 >60 mL/min    Comment: (NOTE) The eGFR has been calculated using the CKD EPI equation. This calculation has not been validated in all clinical situations. eGFR's persistently <60 mL/min signify possible Chronic Kidney Disease.      HEENT: normal and poor dentition Cardio: RRR and no murmur Resp: RRR and no murmur GI: BS positive and NT, ND Extremity:  Pulses positive and No Edema Skin:   Intact Neuro: oriented to person and G[place] not [facility] or time, Flat, Abnormal Sensory absent LT in RUE and RLE, intact on left side, Abnormal Motor 2- R arm ext, 2-/5 grip, 0/5 finger and wrist ext, trace R hip ext, Dysarthric and Other naming intact but decreased fluency, no flexor withdrawl Musc/Skel:  Other no pain with UE or LE ROM Gen NAD   Assessment/Plan: 1. Functional deficits secondary to Left ACA infarct with Right HP which require 3+ hours per day of interdisciplinary therapy in a comprehensive inpatient rehab setting. Physiatrist is providing close team supervision and 24 hour management of active medical problems listed below. Physiatrist and rehab team continue to assess barriers to discharge/monitor patient progress toward functional and medical goals. FIM: Function - Bathing Position: Wheelchair/chair at sink Body parts bathed by patient: Chest, Right arm, Abdomen, Front perineal area, Right upper  leg, Left upper leg Body parts bathed by helper: Front perineal area, Buttocks Assist Level: Touching or steadying assistance(Pt > 75%)  Function- Upper Body Dressing/Undressing What is the patient wearing?: Pull over shirt/dress Pull over shirt/dress - Perfomed by patient: Thread/unthread left sleeve, Put head through opening, Pull shirt over trunk Pull over shirt/dress - Perfomed by helper: Thread/unthread right sleeve, Pull shirt over trunk Assist Level: Touching or steadying assistance(Pt > 75%) Function - Lower Body Dressing/Undressing What is the patient wearing?: Shoes, Pants Position: Wheelchair/chair at sink Underwear - Performed by patient: Thread/unthread left underwear leg Underwear - Performed by helper: Thread/unthread right underwear leg, Pull underwear up/down Pants- Performed by patient: Thread/unthread left pants leg Pants- Performed by helper: Thread/unthread right pants leg, Thread/unthread left pants leg, Pull pants up/down Non-skid slipper socks- Performed by helper: Don/doff right sock, Don/doff left sock Socks - Performed by patient: Don/doff right sock, Don/doff left sock Shoes - Performed by patient: Don/doff left shoe, Fasten left Shoes - Performed by helper: Don/doff right shoe, Fasten right TED Hose - Performed by helper: Don/doff right TED hose, Don/doff left TED hose Assist for footwear: Supervision/touching assist Assist for lower body dressing: 2 Helpers  Function - Toileting Toileting activity did not occur: No continent bowel/bladder event Toileting steps completed by helper: Adjust clothing prior to toileting, Performs perineal hygiene, Adjust clothing after toileting Toileting Assistive Devices: Grab bar or rail Assist level: Two helpers  Function - Air cabin crew transfer activity did not occur: Safety/medical concerns Toilet transfer assistive device: Elevated toilet seat/BSC over toilet Assist  level to toilet: Maximal assist (Pt 25 -  49%/lift and lower) Assist level from toilet: Maximal assist (Pt 25 - 49%/lift and lower) Assist level to bedside commode (at bedside): 2 helpers Assist level from bedside commode (at bedside): 2 helpers  Function - Chair/bed transfer Chair/bed transfer method: Lateral scoot Chair/bed transfer assist level: 2 helpers Chair/bed transfer assistive device: Sliding board Chair/bed transfer details: Verbal cues for technique, Visual cues for safe use of DME/AE, Manual facilitation for weight bearing, Manual facilitation for weight shifting  Function - Locomotion: Wheelchair Will patient use wheelchair at discharge?: Yes Type: Manual Max wheelchair distance: 150 Assist Level: Supervision or verbal cues Wheel 50 feet with 2 turns activity did not occur: Safety/medical concerns (fatigue) Assist Level: Supervision or verbal cues Wheel 150 feet activity did not occur: Safety/medical concerns (fatigue) Assist Level: Supervision or verbal cues Turns around,maneuvers to table,bed, and toilet,negotiates 3% grade,maneuvers on rugs and over doorsills: No Function - Locomotion: Ambulation Ambulation activity did not occur: Safety/medical concerns (lack of sensation) Assistive device: Rail in hallway Max distance: 22 Assist level: Maximal assist (Pt 25 - 49%) Walk 10 feet activity did not occur: Safety/medical concerns Assist level: Maximal assist (Pt 25 - 49%) Walk 50 feet with 2 turns activity did not occur: Safety/medical concerns Walk 150 feet activity did not occur: Safety/medical concerns Walk 10 feet on uneven surfaces activity did not occur: Safety/medical concerns  Function - Comprehension Comprehension: Auditory Comprehension assist level: Understands basic 75 - 89% of the time/ requires cueing 10 - 24% of the time  Function - Expression Expression: Verbal Expression assist level: Expresses basic 25 - 49% of the time/requires cueing 50 - 75% of the time. Uses single  words/gestures.  Function - Social Interaction Social Interaction assist level: Interacts appropriately 90% of the time - Needs monitoring or encouragement for participation or interaction.  Function - Problem Solving Problem solving assist level: Solves basic 25 - 49% of the time - needs direction more than half the time to initiate, plan or complete simple activities  Function - Memory Memory assist level: Recognizes or recalls 25 - 49% of the time/requires cueing 50 - 75% of the time Patient normally able to recall (first 3 days only): Current season, Staff names and faces, That he or she is in a hospital  Medical Problem List and Plan: 1.  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  DVT Prophylaxis/Anticoagulation: Subcutaneous Lovenox. Monitor platelet counts and any signs of bleeding, plt 302K 3. Pain Management: Tylenol as needed. Pain controlled at present 4. Hypertension. Norvasc 5 mg daily, lisinopril 20 mg daily. 114/61 Monitor with increased mobility 5. Neuropsych: This patient is capable of making decisions on his own behalf. 6. Skin/Wound Care: Routine skin checks. OOB. Encourage appropriate nutrition 7. Fluids/Electrolytes/Nutrition: Routine I&O's with follow-up chemistries in AM 8. Hyperlipidemia. Lipitor 9. Tobacco/ cocaine/alcohol abuse. Provide counseling during rehab stay-  10.  Hypoalbuminemia- mild will supplement po 11. Spasticity R UE and LE now on Zanaflex,increase to 2m  monitor BP, check orthostatics LOS (Days) 9 A FACE TO FACE EVALUATION WAS PERFORMED  Gerald [name] E 07/17/2015, 8:06 AM

## 2015-07-17 NOTE — Progress Notes (Signed)
Occupational Therapy Session Note  Patient Details  Name: Gerald [name] MRN: [id number] Date of Birth: Apr 18, 1961  Today's Date: 07/17/2015 OT Individual Time: 1101-1158 OT Individual Time Calculation (min): 57 min    Short Term Goals: Week 2:  OT Short Term Goal 1 (Week 2): Pt will maintain dynamic sitting balance during bathing tasks with mod assist. OT Short Term Goal 2 (Week 2): Pt will complete UB dressing in supported sitting with supervision. OT Short Term Goal 3 (Week 2): Pt will complete LB dressing with mod assist sit to stand. OT Short Term Goal 4 (Week 2): Pt will use the RUE as a stabilizer with mod assist during grooming and bathing tasks.   OT Short Term Goal 5 (Week 2): Pt will perform toilet transfer to the [facility] with mod assist stand pivot.  Skilled Therapeutic Interventions/Progress Updates:    Pt completed shower and dressing during session.  Max assist for transfer in and out of the shower stand pivot.  Pt unable to advance or support weight through the RLE at this time.  Max hand over hand assistance for LUE function use when washing the left side.  Increased flexor activity noted in the right hand with pt being unable to let go of items when commanded to do so.  Increased LOB to the right in sitting without ability to correct without max assist.  Max instructional cueing for hemi dressing techniques as well for both UB and LB.  Pt still with decreased carryover from session to session.  Pt left in wheelchair with call button within reach and safety belt in place.    Therapy Documentation Precautions:  Precautions Precautions: Fall Precaution Comments: dense right hemiparesis Restrictions Weight Bearing Restrictions: No  Pain: Pain Assessment Pain Assessment: No/denies pain ADL: See Function Navigator for Current Functional Status.   Therapy/Group: Individual Therapy  Sherrill Buikema OTR/L 07/17/2015, 12:31 PM

## 2015-07-17 NOTE — Plan of Care (Signed)
Problem: RH BLADDER ELIMINATION Goal: RH STG MANAGE BLADDER WITH ASSISTANCE STG Manage Bladder With Assistance. Max A  Outcome: Not Progressing Frequent incontinent episodes even with timed toileting in place

## 2015-07-17 NOTE — Progress Notes (Signed)
Speech Language Pathology Daily Session Note  Patient Details  Name: Gerald [name] MRN: [id number] Date of Birth: 08-12-1960  Today's Date: 07/17/2015 SLP Individual Time: 0830-0930 SLP Individual Time Calculation (min): 60 min  Short Term Goals: Week 2: SLP Short Term Goal 1 (Week 2): Pt will demonstrate sentence comprehension with 80% acc with supervision  SLP Short Term Goal 2 (Week 2): Pt will follow 3-step directives with 75% acc min A. SLP Short Term Goal 3 (Week 2): Pt will communicate basic needs/wants via phrase level verbal expression with mod  A. SLP Short Term Goal 4 (Week 2): Pt will demonstrate recogition of 3-5 objects given a 5 min delay with min A.  Skilled Therapeutic Interventions: Pt seen upright in bed. Pt reported he slept well and denied any needs. Continued focus on increasing language production to the phrase/sentence level. Pt able to respond in simple sentence format in a structured task with 80% acc with min A of visual cue (hand gesture) for pt to provide additional content. Increased dysfluency noted with increased complexity of language production. Pt said, "my words get jammed up." Written sentence completion from a field of 3,  8/10 with mod A for reading assist- decreasing visual field, correcting errors, etc. Naming to description 7/8 with increased time and occasional phonemic cue.   Function:   Cognition Comprehension Comprehension assist level: Understands basic 75 - 89% of the time/ requires cueing 10 - 24% of the time  Expression   Expression assist level: Expresses basic 25 - 49% of the time/requires cueing 50 - 75% of the time. Uses single words/gestures.  Social Interaction Social Interaction assist level: Interacts appropriately 90% of the time - Needs monitoring or encouragement for participation or interaction.  Problem Solving Problem solving assist level: Solves basic 25 - 49% of the time - needs direction more than half the time to initiate,  plan or complete simple activities  Memory Memory assist level: Recognizes or recalls 50 - 74% of the time/requires cueing 25 - 49% of the time    Pain Pain Assessment Pain Assessment: No/denies pain  Therapy/Group: Individual Therapy  Vinetta Bergamo 07/17/2015, 1:16 PM

## 2015-07-17 NOTE — Progress Notes (Signed)
Physical Therapy Session Note  Patient Details  Name: Gerald [name] MRN: [id number] Date of Birth: 1961-03-25  Today's Date: 07/17/2015 PT Individual Time: [id number] PT Individual Time Calculation (min): 60 min   Short Term Goals: Week 2:  PT Short Term Goal 1 (Week 2): pt will perform functional transfers with max A PT Short Term Goal 2 (Week 2): Pt will perform sit to stand with max A PT Short Term Goal 3 (Week 2): pt will perform bed mobility with mod A  Skilled Therapeutic Interventions/Progress Updates: pt in bed, incontinent of B and B.  Discussed with RN; she will look into condom cath at night.  Pt with poor awareness.  PT reviewed use of call bell with pt; he was able to indicate accurately what to do if he knows he needs to use toilet.  Bed mobility for hygiene change.  Slide board transfer with max assist to initiate a lift, then mod assist as pt completes movement into w/c.  W/c propulsion using hemi method; needs min assist through doorways to R.  neuromuscular re-education via forced use, visual feedback s for RLE wt bearing, midline orientation in static stander.  Pt tolerated x 15 minutes without c/o.  Pt tends to push R with L extremeities, but was able to use mirror effectively to monitor his posture.  PT returned pt to room; left resting in w/c with all needs within reach. Quick release belt donned.  Pt getting some return of l/t sensation R thigh.  No active motor response elicited with mass flexion> mass extension.     Therapy Documentation Precautions:  Precautions Precautions: Fall Precaution Comments: dense right hemiparesis Restrictions Weight Bearing Restrictions: No General:   Vital Signs: Therapy Vitals Temp: 98.2 F (36.8 C) Temp Source: Oral Pulse Rate: 80 Resp: 16 BP: 114/61 mmHg Patient Position (if appropriate): Lying Oxygen Therapy SpO2: 97 % O2 Device: Not Delivered Pain:   Mobility:   Locomotion :    Trunk/Postural Assessment :    Balance:   Exercises:   Other Treatments:     See Function Navigator for Current Functional Status.   Therapy/Group: Individual Therapy  Vidya Bamford 07/17/2015, 10:25 AM

## 2015-07-18 ENCOUNTER — Inpatient Hospital Stay (HOSPITAL_COMMUNITY): Payer: Medicaid Other | Admitting: Physical Therapy

## 2015-07-18 ENCOUNTER — Inpatient Hospital Stay (HOSPITAL_COMMUNITY): Payer: Medicaid Other | Admitting: Speech Pathology

## 2015-07-18 ENCOUNTER — Inpatient Hospital Stay (HOSPITAL_COMMUNITY): Payer: Medicaid Other | Admitting: Occupational Therapy

## 2015-07-18 NOTE — Progress Notes (Signed)
Physical Therapy Session Note  Patient Details  Name: Gerald [name] MRN: [id number] Date of Birth: 05/05/1961  Today's Date: 07/18/2015 PT Individual Time: 0900-1015 PT Individual Time Calculation (min): 75 min   Short Term Goals: Week 2:  PT Short Term Goal 1 (Week 2): pt will perform functional transfers with max A PT Short Term Goal 2 (Week 2): Pt will perform sit to stand with max A PT Short Term Goal 3 (Week 2): pt will perform bed mobility with mod A  Skilled Therapeutic Interventions/Progress Updates:   Patient in bed after eating breakfast, incontinent of bowel and bladder. Patient able to verbalize that he had been incontinent but states that he doesn't know why he didn't call for assistance. Performed rolling multiple times to R with supervision and to L with min A using bed rail for total A hygiene and doffing soiled/donning clean brief and pants. Patient transferred sit > supine to R with cues for sequencing and technique with mod A overall. Performed squat pivot transfers bed > wheelchair <> mat table with max A of one person via Bobath technique to promote forward weight shift and max verbal/visual cues for hand placement and technique. Patient propelled wheelchair via L hemi technique from room to gym with supervision and increased time, verbal/visual cues to effectively utilize LLE for steering. Switched out 20x18 wheelchair for 18x18 wheelchair with Jay2 cushion and back support for improved midline postural control, positioning, and sitting tolerance with improved ease of wheelchair mobility noted by improved speed and efficiency when returning to room at end of session via L hemi technique with supervision.   Patient performed sit <> stand transfers from edge of mat with max A and cues for forward weight shifting and static standing trials to fatigue with max A using mirror for visual feedback with focus on R LE NMR, weight shifting, and midline positioning, trialled with 1" step  under R foot but patient demonstrated improved postural control/standing balance with both feet on floor. Performed step taps using LLE to 1" step with LUE support on [facility] to facilitate RLE weightbearing, weight shifting, and postural control with max A and mirror for visual feedback. Gait training with L rail in hallway x 25 ft with max A (+2 for wheelchair follow) and verbal cues for upright posture and forward gaze, patient demonstrating good trunk control and requiring total A for advancement/placement of RLE with increased gross extension tone noted. Patient left sitting in wheelchair with quick release belt on, R arm trough on, and all needs within reach.   Therapy Documentation Precautions:  Precautions Precautions: Fall Precaution Comments: dense right hemiparesis Restrictions Weight Bearing Restrictions: No Pain: Pain Assessment Pain Assessment: No/denies pain   See Function Navigator for Current Functional Status.   Therapy/Group: Individual Therapy  Laretta Alstrom 07/18/2015, 12:55 PM

## 2015-07-18 NOTE — Progress Notes (Signed)
Speech Language Pathology Daily Session Note  Patient Details  Name: Gerald [name] MRN: [id number] Date of Birth: [birth year]  Today's Date: 07/18/2015 SLP Individual Time: 1400-1500 SLP Individual Time Calculation (min): 60 min  Short Term Goals: Week 2: SLP Short Term Goal 1 (Week 2): Pt will demonstrate sentence comprehension with 80% acc with supervision  SLP Short Term Goal 2 (Week 2): Pt will follow 3-step directives with 75% acc min A. SLP Short Term Goal 3 (Week 2): Pt will communicate basic needs/wants via phrase level verbal expression with mod  A. SLP Short Term Goal 4 (Week 2): Pt will demonstrate recogition of 3-5 objects given a 5 min delay with min A.  Skilled Therapeutic Interventions: Skilled treatment session focused on functional communication. SLP facilitated session by providing Max A visual, verbal and phonemic cues for patient to self-monitor and correct errors while decoding at the sentence level. Patient demonstrated reading comprehension at the sentence level in regards to answering yes/no questions with 80% accuracy and matching a sentence to a picture from a field of 3 with 60% accuracy. Patient handed off to RN at end of session. Continue with current plan of care.    Function:   Cognition Comprehension Comprehension assist level: Understands basic 75 - 89% of the time/ requires cueing 10 - 24% of the time  Expression   Expression assist level: Expresses basic 25 - 49% of the time/requires cueing 50 - 75% of the time. Uses single words/gestures.  Social Interaction Social Interaction assist level: Interacts appropriately 90% of the time - Needs monitoring or encouragement for participation or interaction.  Problem Solving Problem solving assist level: Solves basic 25 - 49% of the time - needs direction more than half the time to initiate, plan or complete simple activities  Memory Memory assist level: Recognizes or recalls 50 - 74% of the time/requires cueing 25  - 49% of the time    Pain Pain Assessment Pain Assessment: No/denies pain  Therapy/Group: Individual Therapy  Gerald [name] 07/18/2015, 3:49 PM

## 2015-07-18 NOTE — Progress Notes (Signed)
Subjective/Complaints: RIght sided spasms are better but not gone ROS- limited by cognition and speech Objective: Vital Signs: Blood pressure 118/69, pulse 81, temperature 98 F (36.7 C), temperature source Oral, resp. rate 18, height 6' (1.829 m), weight 80.06 kg (176 lb 8 oz), SpO2 99 %. No results found. No results found for this or any previous visit (from the past 72 hour(s)).   HEENT: normal and poor dentition Cardio: RRR and no murmur Resp: RRR and no murmur GI: BS positive and NT, ND Extremity:  Pulses positive and No Edema Skin:   Intact Neuro: oriented to person and GBO not Cone or time, Flat, Abnormal Sensory absent LT in RUE and RLE, intact on left side, Abnormal Motor 2- R arm ext, 4-/5 grip with persistent grasp, 3-/5 finger and wrist ext, trace R hip ext, Dysarthric and Other naming intact but decreased fluency, no flexor withdrawl Musc/Skel:  Other no pain with UE or LE ROM Gen NAD   Assessment/Plan: 1. Functional deficits secondary to Left ACA infarct with Right HP which require 3+ hours per day of interdisciplinary therapy in a comprehensive inpatient rehab setting. Physiatrist is providing close team supervision and 24 hour management of active medical problems listed below. Physiatrist and rehab team continue to assess barriers to discharge/monitor patient progress toward functional and medical goals. FIM: Function - Bathing Position: Shower Body parts bathed by patient: Chest, Right arm, Abdomen, Front perineal area, Right upper leg, Left upper leg Body parts bathed by helper: Buttocks, Right lower leg, Left lower leg Assist Level: Touching or steadying assistance(Pt > 75%)  Function- Upper Body Dressing/Undressing What is the patient wearing?: Pull over shirt/dress Pull over shirt/dress - Perfomed by patient: Thread/unthread left sleeve, Put head through opening Pull over shirt/dress - Perfomed by helper: Pull shirt over trunk, Thread/unthread right  sleeve Assist Level: Touching or steadying assistance(Pt > 75%) Function - Lower Body Dressing/Undressing What is the patient wearing?: Shoes, Pants, Ted Hose Position: Wheelchair/chair at [employer] - Performed by patient: Thread/unthread left underwear leg Underwear - Performed by helper: Thread/unthread right underwear leg, Pull underwear up/down Pants- Performed by patient: Thread/unthread left pants leg Pants- Performed by helper: Thread/unthread right pants leg, Thread/unthread left pants leg, Pull pants up/down Non-skid slipper socks- Performed by helper: Don/doff right sock, Don/doff left sock Socks - Performed by patient: Don/doff right sock, Don/doff left sock Shoes - Performed by patient: Don/doff left shoe, Fasten right, Fasten left Shoes - Performed by helper: Don/doff right shoe TED Hose - Performed by helper: Don/doff right TED hose, Don/doff left TED hose Assist for footwear: Supervision/touching assist Assist for lower body dressing: 2 Helpers  Function - Toileting Toileting activity did not occur: No continent bowel/bladder event Toileting steps completed by helper: Adjust clothing prior to toileting, Performs perineal hygiene, Adjust clothing after toileting Toileting Assistive Devices: Grab bar or rail Assist level: Two helpers  Function - Air cabin crew transfer activity did not occur: Safety/medical concerns Toilet transfer assistive device: Elevated toilet seat/BSC over toilet Assist level to toilet: Maximal assist (Pt 25 - 49%/lift and lower) Assist level from toilet: Maximal assist (Pt 25 - 49%/lift and lower) Assist level to bedside commode (at bedside): 2 helpers Assist level from bedside commode (at bedside): 2 helpers  Function - Chair/bed transfer Chair/bed transfer method: Lateral scoot Chair/bed transfer assist level: Maximal assist (Pt 25 - 49%/lift and lower) Chair/bed transfer assistive device: Sliding board Chair/bed transfer  details: Verbal cues for technique, Visual cues for safe use of  DME/AE, Manual facilitation for weight bearing, Manual facilitation for weight shifting  Function - Locomotion: Wheelchair Will patient use wheelchair at discharge?: Yes Type: Manual Max wheelchair distance: 150 Assist Level: Supervision or verbal cues Wheel 50 feet with 2 turns activity did not occur: Safety/medical concerns (fatigue) Assist Level: Touching or steadying assistance (Pt > 75%) Wheel 150 feet activity did not occur: Safety/medical concerns (fatigue) Assist Level: Supervision or verbal cues Turns around,maneuvers to table,bed, and toilet,negotiates 3% grade,maneuvers on rugs and over doorsills: No Function - Locomotion: Ambulation Ambulation activity did not occur: Safety/medical concerns (lack of sensation) Assistive device: Rail in hallway Max distance: 22 Assist level: Maximal assist (Pt 25 - 49%) Walk 10 feet activity did not occur: Safety/medical concerns Assist level: Maximal assist (Pt 25 - 49%) Walk 50 feet with 2 turns activity did not occur: Safety/medical concerns Walk 150 feet activity did not occur: Safety/medical concerns Walk 10 feet on uneven surfaces activity did not occur: Safety/medical concerns  Function - Comprehension Comprehension: Auditory Comprehension assist level: Understands basic 75 - 89% of the time/ requires cueing 10 - 24% of the time  Function - Expression Expression: Verbal Expression assist level: Expresses basic 25 - 49% of the time/requires cueing 50 - 75% of the time. Uses single words/gestures.  Function - Social Interaction Social Interaction assist level: Interacts appropriately 90% of the time - Needs monitoring or encouragement for participation or interaction.  Function - Problem Solving Problem solving assist level: Solves basic 25 - 49% of the time - needs direction more than half the time to initiate, plan or complete simple activities  Function -  Memory Memory assist level: Recognizes or recalls 50 - 74% of the time/requires cueing 25 - 49% of the time Patient normally able to recall (first 3 days only): Current season  Medical Problem List and Plan: 1.  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  DVT Prophylaxis/Anticoagulation: Subcutaneous Lovenox. Monitor platelet counts and any signs of bleeding, plt 302K 3. Pain Management: Tylenol as needed. Pain controlled at present 4. Hypertension. Norvasc 5 mg daily, lisinopril 20 mg daily. 118/69 Monitor with increased mobility 5. Neuropsych: This patient is capable of making decisions on his own behalf. 6. Skin/Wound Care: Routine skin checks. OOB. Encourage appropriate nutrition 7. Fluids/Electrolytes/Nutrition: Routine I&O's with follow-up chemistries in AM 8. Hyperlipidemia. Lipitor 9. Tobacco/ cocaine/alcohol abuse. Provide counseling during rehab stay-  10.  Hypoalbuminemia- mild will supplement po 11. Spasticity R UE and LE now on Zanaflex,increase to 4mg   monitor BP, no orthostatic drops, will d/c LOS (Days) 10 A FACE TO FACE EVALUATION WAS PERFORMED  Pang Robers E 07/18/2015, 7:26 AM

## 2015-07-18 NOTE — Progress Notes (Signed)
Occupational Therapy Session Note  Patient Details  Name: Gerald [name] MRN: [id number] Date of Birth: 11-Mar-1961  Today's Date: 07/18/2015 OT Individual Time: [id number] OT Individual Time Calculation (min): 54 min    Short Term Goals: Week 2:  OT Short Term Goal 1 (Week 2): Pt will maintain dynamic sitting balance during bathing tasks with mod assist. OT Short Term Goal 2 (Week 2): Pt will complete UB dressing in supported sitting with supervision. OT Short Term Goal 3 (Week 2): Pt will complete LB dressing with mod assist sit to stand. OT Short Term Goal 4 (Week 2): Pt will use the RUE as a stabilizer with mod assist during grooming and bathing tasks.   OT Short Term Goal 5 (Week 2): Pt will perform toilet transfer to the [facility] with mod assist stand pivot.  Skilled Therapeutic Interventions/Progress Updates:    Pt worked on bathing and dressing sit to stand at the sink per his request this session.  Mod instructional cueing for hemi bathing and dressing techniques.  Pt still needs max assist to place and keep the RLE up over the left knee during washing and dressing tasks.  Max hand over hand assist for RUE use with decreased activation of elbow extension, digit extension or shoulder flexion movements.  Increased flexor tone noted in the biceps and digits.  Pt needing max assist for sit to stand and standing balance during LB selfcare.  Frequent LOB to the right in sitting unsupported when bathing or trying to dress.  Increased passive trunk elongation on the right with no active shortening noted to correct balance at this time.  Pt was able to donn his pullover shirt with supervision and cueing this session for the first time.  Pt left in wheelchair at end of session with safety belt in place, arm trough supporting the RUE and call button within reach.    Therapy Documentation Precautions:  Precautions Precautions: Fall Precaution Comments: dense right hemiparesis Restrictions Weight  Bearing Restrictions: No  Pain: Pain Assessment Pain Assessment: No/denies pain ADL: See Function Navigator for Current Functional Status.   Therapy/Group: Individual Therapy  Juliyah Mergen OTR/L 07/18/2015, 12:24 PM

## 2015-07-19 ENCOUNTER — Inpatient Hospital Stay (HOSPITAL_COMMUNITY): Payer: Medicaid Other | Admitting: Occupational Therapy

## 2015-07-19 NOTE — Progress Notes (Signed)
Orthopedic Tech Progress Note Patient Details:  Gerald [name] [birth year] [id number]  Patient ID: Gerald [name], male   DOB: 15-Mar-1961, 55 y.o.   MRN: [id number] Disregard previous note concerning bio-tech; this was put in in error  [name], Gerald [name] 07/19/2015, 10:09 AM

## 2015-07-19 NOTE — Progress Notes (Signed)
Occupational Therapy Session Note  Patient Details  Name: Gerald [name] MRN: [id number] Date of Birth: 07-Mar-1961  Today's Date: 07/19/2015 OT Individual Time: 1300-1358 OT Individual Time Calculation (min): 58 min    Short Term Goals: Week 2:  OT Short Term Goal 1 (Week 2): Pt will maintain dynamic sitting balance during bathing tasks with mod assist. OT Short Term Goal 2 (Week 2): Pt will complete UB dressing in supported sitting with supervision. OT Short Term Goal 3 (Week 2): Pt will complete LB dressing with mod assist sit to stand. OT Short Term Goal 4 (Week 2): Pt will use the RUE as a stabilizer with mod assist during grooming and bathing tasks.   OT Short Term Goal 5 (Week 2): Pt will perform toilet transfer to the [facility] with mod assist stand pivot.  Skilled Therapeutic Interventions/Progress Updates:  Upon entering the room, pt in bathroom with NT and RN as pt has had BM accident in clothing. OT continues to assist pt with task as other staff leave. Pt with no c/o , sign, or symptoms of pain this session. Pt required Max A to remove soiled pants and don clean pants and shoes this session while seated on elevated toilet seat. Pt with 2 LOB to the R requiring max A to correct. Pt returning to wheelchair with max A stand pivot transfer. Pt washing B hands while seated in wheelchair with min A for therapist. OT propelled pt to dayroom to transfer onto mat with max A stand pivot transfer. Pt working on upright posture and midline orientation while seated on edge of mat. Sit <>stand from mat with max A and pt not putting R foot onto floor while standing. Pt returned to sitting for reaching task to R while weight bearing through R UE/LE for weight shift onto R side. Pt requiring max A for balance when reaching and progressing to min A for balance. OT educated and demonstrated pt and caregiver on self ROM exercises for R UE in all planes. Pt returned demonstrations with verbal cues from caregiver  for proper technique. Pt returned to room via wheelchair with quick release belt donned and caregiver within room.   Therapy Documentation Precautions:  Precautions Precautions: Fall Precaution Comments: dense right hemiparesis Restrictions Weight Bearing Restrictions: No General:   Vital Signs: Therapy Vitals Temp: 98.5 F (36.9 C) Temp Source: Oral Pulse Rate: 86 Resp: 18 BP: 124/75 mmHg Patient Position (if appropriate): Sitting Oxygen Therapy SpO2: 99 % O2 Device: Not Delivered  See Function Navigator for Current Functional Status.   Therapy/Group: Individual Therapy  [name] 07/19/2015, 3:33 PM

## 2015-07-19 NOTE — Progress Notes (Signed)
Subjective/Complaints: Spasms better but c/o that R hand stays fisted ROS- limited by cognition and speech Objective: Vital Signs: Blood pressure 122/80, pulse 90, temperature 98.1 F (36.7 C), temperature source Oral, resp. rate 18, height 6' (1.829 m), weight 80.06 kg (176 lb 8 oz), SpO2 96 %. No results found. No results found for this or any previous visit (from the past 72 hour(s)).   HEENT: normal and poor dentition Cardio: RRR and no murmur Resp: RRR and no murmur GI: BS positive and NT, ND Extremity:  Pulses positive and No Edema Skin:   Intact Neuro: oriented to person and GBO not Cone or time, Flat, Abnormal Sensory absent LT in RUE and RLE, intact on left side, Abnormal Motor 2- R arm ext, 4-/5 grip with persistent grasp, 3-/5 finger and wrist ext, trace R hip ext, Dysarthric and Other naming intact but decreased fluency, no flexor withdrawal, MAS 2 R hand , pesistent grasp Musc/Skel:  Other no pain with UE or LE ROM Gen NAD   Assessment/Plan: 1. Functional deficits secondary to Left ACA infarct with Right HP which require 3+ hours per day of interdisciplinary therapy in a comprehensive inpatient rehab setting. Physiatrist is providing close team supervision and 24 hour management of active medical problems listed below. Physiatrist and rehab team continue to assess barriers to discharge/monitor patient progress toward functional and medical goals. FIM: Function - Bathing Position: Shower Body parts bathed by patient: Chest, Right arm, Abdomen, Front perineal area, Right upper leg, Left upper leg Body parts bathed by helper: Left arm, Buttocks, Right lower leg, Left lower leg Assist Level: Touching or steadying assistance(Pt > 75%)  Function- Upper Body Dressing/Undressing What is the patient wearing?: Pull over shirt/dress Pull over shirt/dress - Perfomed by patient: Thread/unthread right sleeve, Thread/unthread left sleeve, Put head through opening, Pull shirt over  trunk Pull over shirt/dress - Perfomed by helper: Pull shirt over trunk, Thread/unthread right sleeve Assist Level: Supervision or verbal cues Function - Lower Body Dressing/Undressing What is the patient wearing?: Shoes, Pants, Ted Hose Position: Wheelchair/chair at [employer] - Performed by patient: Thread/unthread left underwear leg Underwear - Performed by helper: Thread/unthread right underwear leg, Pull underwear up/down Pants- Performed by patient: Thread/unthread left pants leg Pants- Performed by helper: Thread/unthread right pants leg, Thread/unthread left pants leg, Pull pants up/down Non-skid slipper socks- Performed by helper: Don/doff right sock, Don/doff left sock Socks - Performed by patient: Don/doff right sock, Don/doff left sock Shoes - Performed by patient: Don/doff left shoe, Fasten left Shoes - Performed by helper: Don/doff right shoe, Fasten right TED Hose - Performed by patient: Don/doff right TED hose, Don/doff left TED hose TED Hose - Performed by helper: Don/doff right TED hose, Don/doff left TED hose Assist for footwear: Supervision/touching assist Assist for lower body dressing: 2 Helpers  Function - Toileting Toileting activity did not occur: No continent bowel/bladder event Toileting steps completed by helper: Adjust clothing prior to toileting, Performs perineal hygiene, Adjust clothing after toileting Toileting Assistive Devices: Grab bar or rail Assist level: Two helpers  Function - Air cabin crew transfer activity did not occur: Safety/medical concerns Toilet transfer assistive device: Elevated toilet seat/BSC over toilet Assist level to toilet: Maximal assist (Pt 25 - 49%/lift and lower) Assist level from toilet: Maximal assist (Pt 25 - 49%/lift and lower) Assist level to bedside commode (at bedside): 2 helpers Assist level from bedside commode (at bedside): 2 helpers  Function - Chair/bed transfer Chair/bed transfer method: Squat  pivot Chair/bed transfer  assist level: Maximal assist (Pt 25 - 49%/lift and lower) Chair/bed transfer assistive device: Sliding board Chair/bed transfer details: Verbal cues for technique, Verbal cues for sequencing, Manual facilitation for weight shifting  Function - Locomotion: Wheelchair Will patient use wheelchair at discharge?: Yes Type: Manual Max wheelchair distance: 150 Assist Level: Supervision or verbal cues Wheel 50 feet with 2 turns activity did not occur: Safety/medical concerns (fatigue) Assist Level: Supervision or verbal cues Wheel 150 feet activity did not occur: Safety/medical concerns (fatigue) Assist Level: Supervision or verbal cues Turns around,maneuvers to table,bed, and toilet,negotiates 3% grade,maneuvers on rugs and over doorsills: No Function - Locomotion: Ambulation Ambulation activity did not occur: Safety/medical concerns (lack of sensation) Assistive device: Rail in hallway Max distance: 25 Assist level: Maximal assist (Pt 25 - 49%) Walk 10 feet activity did not occur: Safety/medical concerns Assist level: Maximal assist (Pt 25 - 49%) Walk 50 feet with 2 turns activity did not occur: Safety/medical concerns Walk 150 feet activity did not occur: Safety/medical concerns Walk 10 feet on uneven surfaces activity did not occur: Safety/medical concerns  Function - Comprehension Comprehension: Auditory Comprehension assist level: Understands basic 75 - 89% of the time/ requires cueing 10 - 24% of the time  Function - Expression Expression: Verbal Expression assist level: Expresses basic 25 - 49% of the time/requires cueing 50 - 75% of the time. Uses single words/gestures.  Function - Social Interaction Social Interaction assist level: Interacts appropriately 90% of the time - Needs monitoring or encouragement for participation or interaction.  Function - Problem Solving Problem solving assist level: Solves basic 25 - 49% of the time - needs direction more  than half the time to initiate, plan or complete simple activities  Function - Memory Memory assist level: Recognizes or recalls 50 - 74% of the time/requires cueing 25 - 49% of the time Patient normally able to recall (first 3 days only): Current season  Medical Problem List and Plan: 1.  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  DVT Prophylaxis/Anticoagulation: Subcutaneous Lovenox. Monitor platelet counts and any signs of bleeding, plt 302K 3. Pain Management: Tylenol as needed. Pain controlled at present 4. Hypertension. Norvasc 5 mg daily, lisinopril 20 mg daily. 122/80 Monitor with increased mobility 5. Neuropsych: This patient is capable of making decisions on his own behalf. 6. Skin/Wound Care: Routine skin checks. OOB. Encourage appropriate nutrition 7. Fluids/Electrolytes/Nutrition: Routine I&O's with follow-up chemistries in AM 8. Hyperlipidemia. Lipitor 9. Tobacco/ cocaine/alcohol abuse. Provide counseling during rehab stay-  10.  Hypoalbuminemia- mild will supplement po 11. Spasticity R UE and LE now on Zanaflex,increase to 4mg   monitor BP,add resting hand splint LOS (Days) 11 A FACE TO FACE EVALUATION WAS PERFORMED  Gerald [name] E 07/19/2015, 7:53 AM

## 2015-07-19 NOTE — Progress Notes (Signed)
Orthopedic Tech Progress Note Patient Details:  Gerald [name] January 16, 1961 [id number] Called in bio-tech brace order; spoke with [name] Patient ID: [name], male   DOB: [birth year], 55 y.o.   MRN: [id number]   Hildred Priest 07/19/2015, 10:07 AM

## 2015-07-20 ENCOUNTER — Inpatient Hospital Stay (HOSPITAL_COMMUNITY): Payer: Medicaid Other | Admitting: Physical Therapy

## 2015-07-20 NOTE — Progress Notes (Signed)
Subjective/Complaints: Sat without leaning in therapy yesterday ROS- limited by cognition and speech Objective: Vital Signs: Blood pressure 124/87, pulse 85, temperature 97.8 F (36.6 C), temperature source Oral, resp. rate 16, height 6' (1.829 m), weight 80.06 kg (176 lb 8 oz), SpO2 99 %. No results found. No results found for this or any previous visit (from the past 72 hour(s)).   HEENT: normal and poor dentition Cardio: RRR and no murmur Resp: RRR and no murmur GI: BS positive and NT, ND Extremity:  Pulses positive and No Edema Skin:   Intact Neuro: oriented to person and GBO not Cone or time, Flat, Abnormal Sensory absent LT in RUE and RLE, intact on left side, Abnormal Motor 2- R arm ext, 4-/5 grip with persistent grasp, 3-/5 finger and wrist ext, trace R hip ext, Dysarthric and Other naming intact but decreased fluency, no flexor withdrawal, MAS 2 R hand , pesistent grasp Musc/Skel:  Other no pain with UE or LE ROM Gen NAD   Assessment/Plan: 1. Functional deficits secondary to Left ACA infarct with Right HP which require 3+ hours per day of interdisciplinary therapy in a comprehensive inpatient rehab setting. Physiatrist is providing close team supervision and 24 hour management of active medical problems listed below. Physiatrist and rehab team continue to assess barriers to discharge/monitor patient progress toward functional and medical goals. FIM: Function - Bathing Position: Shower Body parts bathed by patient: Chest, Right arm, Abdomen, Front perineal area, Right upper leg, Left upper leg Body parts bathed by helper: Left arm, Buttocks, Right lower leg, Left lower leg Assist Level: Touching or steadying assistance(Pt > 75%)  Function- Upper Body Dressing/Undressing What is the patient wearing?: Pull over shirt/dress Pull over shirt/dress - Perfomed by patient: Thread/unthread right sleeve, Thread/unthread left sleeve, Put head through opening, Pull shirt over  trunk Pull over shirt/dress - Perfomed by helper: Pull shirt over trunk, Thread/unthread right sleeve Assist Level: Supervision or verbal cues Function - Lower Body Dressing/Undressing What is the patient wearing?: Shoes, Pants, Ted Hose Position: Wheelchair/chair at [employer] - Performed by patient: Thread/unthread left underwear leg Underwear - Performed by helper: Thread/unthread right underwear leg, Pull underwear up/down Pants- Performed by patient: Thread/unthread left pants leg Pants- Performed by helper: Thread/unthread right pants leg, Thread/unthread left pants leg, Pull pants up/down Non-skid slipper socks- Performed by helper: Don/doff right sock, Don/doff left sock Socks - Performed by patient: Don/doff right sock, Don/doff left sock Shoes - Performed by patient: Don/doff left shoe, Fasten left Shoes - Performed by helper: Don/doff right shoe, Fasten right TED Hose - Performed by patient: Don/doff right TED hose, Don/doff left TED hose TED Hose - Performed by helper: Don/doff right TED hose, Don/doff left TED hose Assist for footwear: Supervision/touching assist Assist for lower body dressing: 2 Helpers  Function - Toileting Toileting activity did not occur: No continent bowel/bladder event Toileting steps completed by patient: Adjust clothing prior to toileting Toileting steps completed by helper: Adjust clothing prior to toileting, Performs perineal hygiene, Adjust clothing after toileting Toileting Assistive Devices: Grab bar or rail Assist level: Two helpers  Function - Air cabin crew transfer activity did not occur: Safety/medical concerns Toilet transfer assistive device: Elevated toilet seat/BSC over toilet Assist level to toilet: Maximal assist (Pt 25 - 49%/lift and lower) Assist level from toilet: Maximal assist (Pt 25 - 49%/lift and lower) Assist level to bedside commode (at bedside): 2 helpers Assist level from bedside commode (at bedside): 2  helpers  Function - Chair/bed transfer  Chair/bed transfer method: Squat pivot Chair/bed transfer assist level: Maximal assist (Pt 25 - 49%/lift and lower) Chair/bed transfer assistive device: Sliding board Chair/bed transfer details: Verbal cues for technique, Verbal cues for sequencing, Manual facilitation for weight shifting   Function - Locomotion: Wheelchair Will patient use wheelchair at discharge?: Yes Type: Manual Max wheelchair distance: 150 Assist Level: Supervision or verbal cues Wheel 50 feet with 2 turns activity did not occur: Safety/medical concerns (fatigue) Assist Level: Supervision or verbal cues Wheel 150 feet activity did not occur: Safety/medical concerns (fatigue) Assist Level: Supervision or verbal cues Turns around,maneuvers to table,bed, and toilet,negotiates 3% grade,maneuvers on rugs and over doorsills: No Function - Locomotion: Ambulation Ambulation activity did not occur: Safety/medical concerns (lack of sensation) Assistive device: Rail in hallway Max distance: 25 Assist level: Maximal assist (Pt 25 - 49%) Walk 10 feet activity did not occur: Safety/medical concerns Assist level: Maximal assist (Pt 25 - 49%) Walk 50 feet with 2 turns activity did not occur: Safety/medical concerns Walk 150 feet activity did not occur: Safety/medical concerns Walk 10 feet on uneven surfaces activity did not occur: Safety/medical concerns  Function - Comprehension Comprehension: Auditory Comprehension assist level: Understands basic 75 - 89% of the time/ requires cueing 10 - 24% of the time  Function - Expression Expression: Verbal Expression assist level: Expresses basic 25 - 49% of the time/requires cueing 50 - 75% of the time. Uses single words/gestures.  Function - Social Interaction Social Interaction assist level: Interacts appropriately 90% of the time - Needs monitoring or encouragement for participation or interaction.  Function - Problem Solving Problem  solving assist level: Solves basic 25 - 49% of the time - needs direction more than half the time to initiate, plan or complete simple activities  Function - Memory Memory assist level: Recognizes or recalls 50 - 74% of the time/requires cueing 25 - 49% of the time Patient normally able to recall (first 3 days only): Current season  Medical Problem List and Plan: 1.  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  DVT Prophylaxis/Anticoagulation: Subcutaneous Lovenox. Monitor platelet counts and any signs of bleeding, plt 302K 3. Pain Management: Tylenol as needed. Pain controlled at present 4. Hypertension. Norvasc 5 mg daily, lisinopril 20 mg daily. 124/87 Monitor with increased mobility 5. Neuropsych: This patient is capable of making decisions on his own behalf. 6. Skin/Wound Care: Routine skin checks. OOB. Encourage appropriate nutrition 7. Fluids/Electrolytes/Nutrition: Routine I&O's with follow-up chemistries in AM 8. Hyperlipidemia. Lipitor 9. Tobacco/ cocaine/alcohol abuse. Provide counseling during rehab stay-  10.  Hypoalbuminemia- mild will supplement po 11. Spasticity R UE and LE now on Zanaflex,increase to 4mg   monitor BP,add resting hand splint LOS (Days) 12 A FACE TO FACE EVALUATION WAS PERFORMED  Jaima Janney E 07/20/2015, 8:44 AM

## 2015-07-20 NOTE — Progress Notes (Signed)
Orthopedic Tech Progress Note Patient Details:  Gerald [name] 10-Sep-1960 [id number]  Patient ID: Gerald [name], male   DOB: [birth year], 55 y.o.   MRN: [id number] Called in advanced brace order; spoke with Carlye Grippe, Jaymz Traywick 07/20/2015, 11:04 AM

## 2015-07-20 NOTE — Progress Notes (Signed)
Physical Therapy Session Note  Patient Details  Name: Gerald [name] MRN: [id number] Date of Birth: January 20, 1961  Today's Date: 07/20/2015 PT Individual Time: 1415-1500 PT Individual Time Calculation (min): 45 min   Short Term Goals: Week 2:  PT Short Term Goal 1 (Week 2): pt will perform functional transfers with max A PT Short Term Goal 2 (Week 2): Pt will perform sit to stand with max A PT Short Term Goal 3 (Week 2): pt will perform bed mobility with mod A  Skilled Therapeutic Interventions/Progress Updates:    Pt received supine in bed, no c/o pain and agreeable to treatment. Girlfriend present to observe majority of the session. Pt dons L shoe with S, requires maxA for RLE; does reposition LE to reach foot but unable to coordinate/problem solve putting shoe on. Gait 4 x 25' with L hall rail. Significant intentional tone in RLE preventing progression on initially trial. Second through fourth trials performed with RLE blue rocker with improvement in tone/spasticity, however continues to require maxA for LLE progression. MaxA sit <>stand due to L extremities pushing to R side. Sit <>stand and L lateral reaching to retrieve horseshoes to facilitate L weight shifting, midline orientation. Mirror feedback with very little improvement. 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 Squat pivot x3 throughout session with modA. Remained seated in w/c at completion of session, family present and all needs within reach. Quick release belt not donned with family present, informed RN.   Therapy Documentation Precautions:  Precautions Precautions: Fall Precaution Comments: dense right hemiparesis Restrictions Weight Bearing Restrictions: No Pain: Pain Assessment Pain Assessment: No/denies pain Pain Score: 0-No pain   See Function Navigator for Current Functional Status.   Therapy/Group: Individual Therapy  Luberta Mutter 07/20/2015, 3:46 PM

## 2015-07-21 ENCOUNTER — Inpatient Hospital Stay (HOSPITAL_COMMUNITY): Payer: Medicaid Other | Admitting: Occupational Therapy

## 2015-07-21 ENCOUNTER — Inpatient Hospital Stay (HOSPITAL_COMMUNITY): Payer: Medicaid Other | Admitting: Speech Pathology

## 2015-07-21 ENCOUNTER — Inpatient Hospital Stay (HOSPITAL_COMMUNITY): Payer: Medicaid Other | Admitting: Physical Therapy

## 2015-07-21 NOTE — Progress Notes (Signed)
Speech Language Pathology Daily Session Note  Patient Details  Name: Gerald [name] MRN: [id number] Date of Birth: 1960/06/22  Today's Date: 07/21/2015 SLP Individual Time: 1000-1100 SLP Individual Time Calculation (min): 60 min  Short Term Goals: Week 2: SLP Short Term Goal 1 (Week 2): Pt will demonstrate sentence comprehension with 80% acc with supervision  SLP Short Term Goal 2 (Week 2): Pt will follow 3-step directives with 75% acc min A. SLP Short Term Goal 3 (Week 2): Pt will communicate basic needs/wants via phrase level verbal expression with mod  A. SLP Short Term Goal 4 (Week 2): Pt will demonstrate recogition of 3-5 objects given a 5 min delay with min A.  Skilled Therapeutic Interventions: Pt seen upright in wheelchair. Pt engaged and participative throughout the session. Upon entering the pt room, there were chicken wings and dip on the floor. When asked how to get assist for clean up, pt was immediately able to provide an appropriate response, but initiation to request help remains poor. Focus continues this date on increasing language production to the sentence level. Min verbal and visual cueing required this date to elicit consistent simple sentence level responses in structured activities. Following 1-step 2 component written directions required mod A for information processing. Comparison Y/N questions answered at 100% acc at mod I level with self-correction. Mod-max A required for simple divergent naming task (3 items within a familiar category). Pt benefited from consistent encouragement to use strategies of visualization of familiar items and using fund of knowledge from pre-stroke life.   Function:    Cognition Comprehension Comprehension assist level: Understands basic 75 - 89% of the time/ requires cueing 10 - 24% of the time  Expression   Expression assist level: Expresses basic 25 - 49% of the time/requires cueing 50 - 75% of the time. Uses single words/gestures.   Social Interaction Social Interaction assist level: Interacts appropriately 90% of the time - Needs monitoring or encouragement for participation or interaction.  Problem Solving Problem solving assist level: Solves basic 25 - 49% of the time - needs direction more than half the time to initiate, plan or complete simple activities  Memory Memory assist level: Recognizes or recalls 50 - 74% of the time/requires cueing 25 - 49% of the time    Pain Pain Assessment Pain Assessment: No/denies pain  Therapy/Group: Individual Therapy  Vinetta Bergamo 07/21/2015, 3:30 PM

## 2015-07-21 NOTE — Progress Notes (Signed)
Occupational Therapy Session Note  Patient Details  Name: Gerald [name] MRN: [id number] Date of Birth: [birth year]  Today's Date: 07/21/2015 OT Individual Time: 1331-1401 OT Individual Time Calculation (min): 30 min    Skilled Therapeutic Interventions/Progress Updates:    Pt completed toilet transfer and toileting during session.  Mod assist for transfer stand pivot to the left to get onto the toilet and max assist to transfer off.  Pt with soiled brief, pants, and wheelchair cushion.  Did have small BM however which is an improvement from previous sessions.  Total assist for changing clothing after toileting as well.  Pt left in wheelchair with safety belt in place and his sister present.  Discussed with sister current progress with RUE function.    Therapy Documentation Precautions:  Precautions Precautions: Fall Precaution Comments: dense right hemiparesis Restrictions Weight Bearing Restrictions: No  Pain: Pain Assessment Pain Assessment: No/denies pain ADL: See Function Navigator for Current Functional Status.   Therapy/Group: Individual Therapy  Dylann Layne OTR/L 07/21/2015, 3:46 PM

## 2015-07-21 NOTE — Progress Notes (Signed)
Physical Therapy Session Note  Patient Details  Name: Gerald [name] MRN: [id number] Date of Birth: 1960/11/05  Today's Date: 07/21/2015 PT Individual Time: 0800-0900 PT Individual Time Calculation (min): 60 min   Short Term Goals: Week 2:  PT Short Term Goal 1 (Week 2): pt will perform functional transfers with max A PT Short Term Goal 2 (Week 2): Pt will perform sit to stand with max A PT Short Term Goal 3 (Week 2): pt will perform bed mobility with mod A  Skilled Therapeutic Interventions/Progress Updates:    Pt received supine in bed, no c/o pain and agreeable to treatment. Donned B socks with minA, pants minA with cues for bridging for pulling pants up hips. Pt threaded LLE first; educated regarding easier performance when donning RLE first. Upper body dressing first trial minA for pulling shirt over trunk on R side. Second trial (after spilling toothpaste on first shirt), donned shirt with S while seated in w/c. Seated in w/c at sink performed teeth brushing with S. W/c propulsion with L hemi technique x100' with minA due to difficulty with navigation and veering to R. Transfers x3 during session modA squat pivot to L. Sitting balance on edge of mat table with RUE supported on table in ER, extension for weight bearing and stretching. Performed R lateral reaching with LUE to improve recovery strategies to midline. Pt performs each reach very quickly, requires cues for slowing performance. R lateral lean x5 trials with core activation to regain balance to midline, discouraging use of LUE for regaining balance. Sit <>stand x4 trials in parallel bars with L lateral reaching to hook horseshoes on wall and improve midline orientation. Returned to room totalA for energy conservation; remained seated in w/c with all needs in reach and quick release belt intact at completion of session.   Therapy Documentation Precautions:  Precautions Precautions: Fall Precaution Comments: dense right  hemiparesis Restrictions Weight Bearing Restrictions: No Pain: Pain Assessment Pain Assessment: No/denies pain Pain Score: 0-No pain   See Function Navigator for Current Functional Status.   Therapy/Group: Individual Therapy  Luberta Mutter 07/21/2015, 10:51 AM

## 2015-07-21 NOTE — Progress Notes (Signed)
Subjective/Complaints: No issues overnite ROS- limited by cognition and speech Objective: Vital Signs: Blood pressure 138/90, pulse 80, temperature 98 F (36.7 C), temperature source Oral, resp. rate 18, height 6' (1.829 m), weight 80.06 kg (176 lb 8 oz), SpO2 98 %. No results found. No results found for this or any previous visit (from the past 72 hour(s)).   HEENT: normal and poor dentition Cardio: RRR and no murmur Resp: RRR and no murmur GI: BS positive and NT, ND Extremity:  Pulses positive and No Edema Skin:   Intact Neuro: oriented to person and GBO not C[facility] or time, Flat, Abnormal Sensory absent LT in RUE and RLE, intact on left side, Abnormal Motor 2- R arm ext, 4-/5 grip with persistent grasp, 3-/5 finger and wrist ext, trace R hip ext, Dysarthric and Other naming intact but decreased fluency, no flexor withdrawal, MAS 2 R hand , pesistent grasp Musc/Skel:  Other no pain with UE or LE ROM Gen NAD   Assessment/Plan: 1. Functional deficits secondary to Left ACA infarct with Right HP which require 3+ hours per day of interdisciplinary therapy in a comprehensive inpatient rehab setting. Physiatrist is providing close team supervision and 24 hour management of active medical problems listed below. Physiatrist and rehab team continue to assess barriers to discharge/monitor patient progress toward functional and medical goals. FIM: Function - Bathing Position: Shower Body parts bathed by patient: Chest, Right arm, Abdomen, Front perineal area, Right upper leg, Left upper leg Body parts bathed by helper: Left arm, Buttocks, Right lower leg, Left lower leg Assist Level: Touching or steadying assistance(Pt > 75%)  Function- Upper Body Dressing/Undressing What is the patient wearing?: Pull over shirt/dress Pull over shirt/dress - Perfomed by patient: Thread/unthread right sleeve, Thread/unthread left sleeve, Put head through opening, Pull shirt over trunk Pull over shirt/dress -  Perfomed by helper: Pull shirt over trunk, Thread/unthread right sleeve Assist Level: Supervision or verbal cues Function - Lower Body Dressing/Undressing What is the patient wearing?: Shoes, Pants, Ted Hose Position: Wheelchair/chair at [employer] - Performed by patient: Thread/unthread left underwear leg Underwear - Performed by helper: Thread/unthread right underwear leg, Pull underwear up/down Pants- Performed by patient: Thread/unthread left pants leg Pants- Performed by helper: Thread/unthread right pants leg, Thread/unthread left pants leg, Pull pants up/down Non-skid slipper socks- Performed by helper: Don/doff right sock, Don/doff left sock Socks - Performed by patient: Don/doff right sock, Don/doff left sock Shoes - Performed by patient: Don/doff left shoe, Fasten left Shoes - Performed by helper: Don/doff right shoe, Fasten right TED Hose - Performed by patient: Don/doff right TED hose, Don/doff left TED hose TED Hose - Performed by helper: Don/doff right TED hose, Don/doff left TED hose Assist for footwear: Supervision/touching assist Assist for lower body dressing: 2 Helpers  Function - Toileting Toileting activity did not occur: No continent bowel/bladder event Toileting steps completed by patient: Adjust clothing prior to toileting Toileting steps completed by helper: Adjust clothing prior to toileting, Performs perineal hygiene, Adjust clothing after toileting Toileting Assistive Devices: Grab bar or rail Assist level: Two helpers  Function - Air cabin crew transfer activity did not occur: Safety/medical concerns Toilet transfer assistive device: Elevated toilet seat/BSC over toilet Assist level to toilet: Maximal assist (Pt 25 - 49%/lift and lower) Assist level from toilet: Maximal assist (Pt 25 - 49%/lift and lower) Assist level to bedside commode (at bedside): 2 helpers Assist level from bedside commode (at bedside): 2 helpers  Function - Chair/bed  transfer Chair/bed transfer method:  Squat pivot Chair/bed transfer assist level: Moderate assist (Pt 50 - 74%/lift or lower) Chair/bed transfer assistive device: Sliding board Chair/bed transfer details: Verbal cues for technique, Verbal cues for sequencing, Manual facilitation for weight shifting, Tactile cues for weight shifting   Function - Locomotion: Wheelchair Will patient use wheelchair at discharge?: Yes Type: Manual Max wheelchair distance: 150 Assist Level: Supervision or verbal cues Wheel 50 feet with 2 turns activity did not occur: Safety/medical concerns (fatigue) Assist Level: Supervision or verbal cues Wheel 150 feet activity did not occur: Safety/medical concerns (fatigue) Assist Level: Supervision or verbal cues Turns around,maneuvers to table,bed, and toilet,negotiates 3% grade,maneuvers on rugs and over doorsills: No Function - Locomotion: Ambulation Ambulation activity did not occur: Safety/medical concerns (lack of sensation) Assistive device: Rail in hallway Max distance: 25 Assist level: Moderate assist (Pt 50 - 74%) Walk 10 feet activity did not occur: Safety/medical concerns Assist level: Moderate assist (Pt 50 - 74%) Walk 50 feet with 2 turns activity did not occur: Safety/medical concerns Walk 150 feet activity did not occur: Safety/medical concerns Walk 10 feet on uneven surfaces activity did not occur: Safety/medical concerns  Function - Comprehension Comprehension: Auditory Comprehension assist level: Understands basic 75 - 89% of the time/ requires cueing 10 - 24% of the time  Function - Expression Expression: Verbal Expression assist level: Expresses basic 25 - 49% of the time/requires cueing 50 - 75% of the time. Uses single words/gestures.  Function - Social Interaction Social Interaction assist level: Interacts appropriately 90% of the time - Needs monitoring or encouragement for participation or interaction.  Function - Problem  Solving Problem solving assist level: Solves basic 25 - 49% of the time - needs direction more than half the time to initiate, plan or complete simple activities  Function - Memory Memory assist level: Recognizes or recalls 50 - 74% of the time/requires cueing 25 - 49% of the time Patient normally able to recall (first 3 days only): Current season  Medical Problem List and Plan: 1.  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  DVT Prophylaxis/Anticoagulation: Subcutaneous Lovenox. Monitor platelet counts and any signs of bleeding, plt 302K 3. Pain Management: Tylenol as needed. Pain controlled at present 4. Hypertension. Norvasc 5 mg daily, lisinopril 20 mg daily. 138/90Monitor with increased mobility 5. Neuropsych: This patient is capable of making decisions on his own behalf. 6. Skin/Wound Care: Routine skin checks. OOB. Encourage appropriate nutrition 7. Fluids/Electrolytes/Nutrition: Routine I&O's with follow-up chemistries in AM 8. Hyperlipidemia. Lipitor 9. Tobacco/ cocaine/alcohol abuse. Provide counseling during rehab stay-  10.  Hypoalbuminemia- mild will supplement po 11. Spasticity R UE and LE now on Zanaflex,increase to 4mg   monitor BP,add resting hand splint LOS (Days) 13 A FACE TO FACE EVALUATION WAS PERFORMED  Taeler Winning E 07/21/2015, 7:37 AM

## 2015-07-21 NOTE — Progress Notes (Signed)
Occupational Therapy Session Note  Patient Details  Name: Gerald [name] MRN: [id number] Date of Birth: Nov 27, 1960  Today's Date: 07/21/2015 OT Individual Time: [id number] Individual Time Calculation (min): 57 min    Short Term Goals: Week 2:  OT Short Term Goal 1 (Week 2): Pt will maintain dynamic sitting balance during bathing tasks with mod assist. OT Short Term Goal 2 (Week 2): Pt will complete UB dressing in supported sitting with supervision. OT Short Term Goal 3 (Week 2): Pt will complete LB dressing with mod assist sit to stand. OT Short Term Goal 4 (Week 2): Pt will use the RUE as a stabilizer with mod assist during grooming and bathing tasks.   OT Short Term Goal 5 (Week 2): Pt will perform toilet transfer to the [facility] with mod assist stand pivot.  Skilled Therapeutic Interventions/Progress Updates:   Pt worked on wheelchair mobility to and from the gym with max instructional cueing and min assist.  He still demonstrates decreased motor planning of the LUE and LLE to push the chair and stay off of the wall on the right side.  Squat pivot transfer to the mat with mod assist.  Worked on trunk control and RUE weightbearing while having him retrieve clothespins from the floor at midline and place them on the grid on the right side, using the LUE.  Max assist for balance as pt would fall to the right multiple times.  Encouraged RUE use to keep balance but pt also needing max assist for this as well.  Progressed to supine position with use of dowel rod and BUE movements to progress motor planning deficits.  He needed max assist to position the RUE into shoulder flexion at 90 degrees with elbow extension but he was able to sustain it for 10 seconds.  Progressed to movements of shoulder flexion with noted active shoulder movement as well which was not present when attempting to push a tilted stool in sitting.  Pt transferred back to the wheelchair and back to the room at conclusion of session.  Pt  left in sitting with safety belt in place and call button in reach.     Therapy Documentation Precautions:  Precautions Precautions: Fall Precaution Comments: dense right hemiparesis Restrictions Weight Bearing Restrictions: No  Pain: Pain Assessment Pain Assessment: No/denies pain Pain Score: 0-No pain ADL: See Function Navigator for Current Functional Status.   Therapy/Group: Individual Therapy  Reiana Poteet OTR/L 07/21/2015, 12:38 PM

## 2015-07-22 ENCOUNTER — Inpatient Hospital Stay (HOSPITAL_COMMUNITY): Payer: Medicaid Other | Admitting: Occupational Therapy

## 2015-07-22 ENCOUNTER — Inpatient Hospital Stay (HOSPITAL_COMMUNITY): Payer: Self-pay | Admitting: Physical Therapy

## 2015-07-22 ENCOUNTER — Inpatient Hospital Stay (HOSPITAL_COMMUNITY): Payer: Medicaid Other | Admitting: Speech Pathology

## 2015-07-22 LAB — CREATININE, SERUM
Creatinine, Ser: 0.96 mg/dL (ref 0.61–1.24)
GFR calc Af Amer: >60 mL/min (ref >60)
GFR calc non Af Amer: >60 mL/min (ref >60)

## 2015-07-22 NOTE — Progress Notes (Signed)
Physical Therapy Note  Patient Details  Name: LAFAYETTE MEECH MRN: [id number] Date of Birth: 1961-03-22 Today's Date: 07/22/2015    Time: [phone] 90 minutes  1:1 No c/o pain. Bed mobility with min A to roll, max A for sidelying to sit due to pt with impaired balance and proprioception, needs initial max A for trunk righting and balance in sitting.  Pt able to sit with supervision edge of bed to don shirt and socks and shoes.  Pt max A for standing balance to don pants.  Mod/max A transfers bed <> w/c with squat pivot, facilitation for forward lean and using LEs to lift.  Sit to stand repetitions with focus on staying centered by giving pt an object to reach for to decrease L UE pushing and bring trunk to midline. Pt able to perform with max A.  Standing balance with reaching task focus on midline and wt shifts to Lt, pt still with difficulty crossing midline to the left.  Gait in hallway at [place] with max A with blue rocker AFO on Rt LE. Pt requires max manual facilitation for wt shifts Lt and for advancing R LE.  Seated balance training with ball tap with pt improving balance reactions in sitting, able to perform with close supervision.  Pt performed w/c mobility in controlled environment with occasional min A for obstacle negotiation.  Pt with episode of incontinence, performed rolling multiple times with min A.   Detron Carras 07/22/2015, 11:32 AM

## 2015-07-22 NOTE — Progress Notes (Signed)
Physical Therapy Weekly Progress Note  Patient Details  Name: Gerald [name] MRN: [id number] Date of Birth: June 05, 1961  Beginning of progress report period: July 15, 2015 End of progress report period: July 22, 2015  Patient has met 2 of 3 short term goals.  Pt has improved transfers and sit to stands to mod A, continues to occasionally require max A for supine to sit due to poor trunk control.  Patient continues to demonstrate the following deficits: impaired proprioception, impaired balance, pushing, decreased strength and increased tone on Rt UE and LE and therefore will continue to benefit from skilled PT intervention to enhance overall performance with activity tolerance, balance, postural control, ability to compensate for deficits, functional use of  right upper extremity and right lower extremity, awareness and coordination.  Patient progressing toward long term goals..  Continue plan of care.  PT Short Term Goals Week 2:  PT Short Term Goal 1 (Week 2): pt will perform functional transfers with max A PT Short Term Goal 1 - Progress (Week 2): Met PT Short Term Goal 2 (Week 2): Pt will perform sit to stand with max A PT Short Term Goal 2 - Progress (Week 2): Met PT Short Term Goal 3 (Week 2): pt will perform bed mobility with mod A PT Short Term Goal 3 - Progress (Week 2): Progressing toward goal Week 3:  PT Short Term Goal 1 (Week 3): pt will perform functional transfers with mod A consistently PT Short Term Goal 2 (Week 3): pt will perform standing balance for therapeutic task with mod A PT Short Term Goal 3 (Week 3): pt will perform supine to sit with mod A  Skilled Therapeutic Interventions/Progress Updates:  Ambulation/gait training;Balance/vestibular training;Cognitive remediation/compensation;Community reintegration;Discharge planning;Neuromuscular re-education;Functional mobility training;Functional electrical stimulation;DME/adaptive equipment instruction;Pain  management;Patient/family education;Psychosocial support;Splinting/orthotics;UE/LE Coordination activities;UE/LE Strength taining/ROM;Therapeutic Exercise;Therapeutic Activities;Stair training;Wheelchair propulsion/positioning    See Function Navigator for Current Functional Status.  [name] 07/22/2015, 11:40 AM

## 2015-07-22 NOTE — Progress Notes (Signed)
Occupational Therapy Session Note  Patient Details  Name: MOSHEH EYMARD MRN: [id number] Date of Birth: 07-18-1960  Today's Date: 07/22/2015 OT Individual Time: [id number] OT Individual Time Calculation (min): 56 min    Short Term Goals: Week 2:  OT Short Term Goal 1 (Week 2): Pt will maintain dynamic sitting balance during bathing tasks with mod assist. OT Short Term Goal 2 (Week 2): Pt will complete UB dressing in supported sitting with supervision. OT Short Term Goal 3 (Week 2): Pt will complete LB dressing with mod assist sit to stand. OT Short Term Goal 4 (Week 2): Pt will use the RUE as a stabilizer with mod assist during grooming and bathing tasks.   OT Short Term Goal 5 (Week 2): Pt will perform toilet transfer to the [facility] with mod assist stand pivot.  Skilled Therapeutic Interventions/Progress Updates:    Pt worked on bathing and dressing during session.  Max assist for transfer to and from the shower with max assist.  He needed min assist for UB bathing and max assist for LB bathing sit to stand.  LOB X 3 to the right requiring max assist to correct.  Pt needs max hand over hand assist for RUE use to wash the left arm.  Increased flexor tone noted in the biceps and digits noted.  Pt with motor planning deficits with attempted digit extension to let go of the washcloth, towel, or shirt when attempting to donn it.  Completed dressing sit to stand and mod questioning cueing for hemi techniques.  Pt needing max assist to dressing the RUE and RLE but could assist some with the LLE.  LOB forward however if attempting to hold clothing with the LUE and lift the LLE as to put pants on or shoe on.  Pt left in wheelchair with safety belt in place and call button in reach.    Therapy Documentation Precautions:  Precautions Precautions: Fall Precaution Comments: dense right hemiparesis Restrictions Weight Bearing Restrictions: No  Pain: Pain Assessment Pain Assessment: No/denies  pain ADL: See Function Navigator for Current Functional Status.   Therapy/Group: Individual Therapy  Mehtab Dolberry OTR/L 07/22/2015, 12:13 PM

## 2015-07-22 NOTE — Plan of Care (Signed)
Problem: RH BLADDER ELIMINATION Goal: RH STG MANAGE BLADDER WITH ASSISTANCE STG Manage Bladder With Assistance. Max A  Outcome: Not Progressing Pt is Total A

## 2015-07-22 NOTE — Progress Notes (Signed)
Subjective/Complaints:  ROS- limited by cognition and speech Objective: Vital Signs: Blood pressure 126/80, pulse 84, temperature 98.2 F (36.8 C), temperature source Oral, resp. rate 18, height 6' (1.829 m), weight 80.06 kg (176 lb 8 oz), SpO2 99 %. No results found. Results for orders placed or performed during the hospital encounter of 07/08/15 (from the past 72 hour(s))  Creatinine, serum     Status: None   Collection Time: 07/22/15  6:26 AM  Result Value Ref Range   Creatinine, Ser 0.96 0.61 - 1.24 mg/dL   GFR calc non Af Amer >60 >60 mL/min   GFR calc Af Amer >60 >60 mL/min    Comment: (NOTE) The eGFR has been calculated using the CKD EPI equation. This calculation has not been validated in all clinical situations. eGFR's persistently <60 mL/min signify possible Chronic Kidney Disease.      HEENT: normal and poor dentition Cardio: RRR and no murmur Resp: RRR and no murmur GI: BS positive and NT, ND Extremity:  Pulses positive and No Edema Skin:   Intact Neuro: oriented to person and GBO not [place] or time, Flat, Abnormal Sensory absent LT in RUE and RLE, intact on left side, Abnormal Motor 2- R arm ext, 4-/5 grip with persistent grasp, 3-/5 finger and wrist ext, trace R hip ext, Dysarthric and Other naming intact but decreased fluency, no flexor withdrawal, MAS 2 R hand , pesistent grasp Musc/Skel:  Other no pain with UE or LE ROM Gen NAD   Assessment/Plan: 1. Functional deficits secondary to Left ACA infarct with Right HP which require 3+ hours per day of interdisciplinary therapy in a comprehensive inpatient rehab setting. Physiatrist is providing close team supervision and 24 hour management of active medical problems listed below. Physiatrist and rehab team continue to assess barriers to discharge/monitor patient progress toward functional and medical goals. FIM: Function - Bathing Position: Shower Body parts bathed by patient: Chest, Right arm, Abdomen, Front  perineal area, Right upper leg, Left upper leg Body parts bathed by helper: Left arm, Buttocks, Right lower leg, Left lower leg Assist Level: Touching or steadying assistance(Pt > 75%)  Function- Upper Body Dressing/Undressing What is the patient wearing?: Pull over shirt/dress Pull over shirt/dress - Perfomed by patient: Thread/unthread right sleeve, Thread/unthread left sleeve, Put head through opening, Pull shirt over trunk Pull over shirt/dress - Perfomed by helper: Pull shirt over trunk, Thread/unthread right sleeve Assist Level: Supervision or verbal cues Function - Lower Body Dressing/Undressing What is the patient wearing?: Socks, Shoes, Pants Position: Bed Underwear - Performed by patient: Thread/unthread left underwear leg Underwear - Performed by helper: Thread/unthread right underwear leg, Pull underwear up/down Pants- Performed by patient: Thread/unthread left pants leg, Pull pants up/down Pants- Performed by helper: Thread/unthread right pants leg, Thread/unthread left pants leg, Pull pants up/down Non-skid slipper socks- Performed by helper: Don/doff right sock, Don/doff left sock Socks - Performed by patient: Don/doff right sock, Don/doff left sock Shoes - Performed by patient: Don/doff left shoe, Fasten left Shoes - Performed by helper: Don/doff right shoe, Don/doff left shoe, Fasten right, Fasten left TED Hose - Performed by patient: Don/doff right TED hose, Don/doff left TED hose TED Hose - Performed by helper: Don/doff right TED hose, Don/doff left TED hose Assist for footwear: Supervision/touching assist Assist for lower body dressing: Touching or steadying assistance (Pt > 75%)  Function - Toileting Toileting activity did not occur: No continent bowel/bladder event Toileting steps completed by patient: Adjust clothing prior to toileting Toileting steps completed  by helper: Adjust clothing prior to toileting, Performs perineal hygiene, Adjust clothing after  toileting Toileting Assistive Devices: Grab bar or rail Assist level: Two helpers  Function - Air cabin crew transfer activity did not occur: Safety/medical concerns Toilet transfer assistive device: Elevated toilet seat/BSC over toilet Assist level to toilet: Moderate assist (Pt 50 - 74%/lift or lower) Assist level from toilet: Maximal assist (Pt 25 - 49%/lift and lower) Assist level to bedside commode (at bedside): 2 helpers Assist level from bedside commode (at bedside): 2 helpers  Function - Chair/bed transfer Chair/bed transfer method: Squat pivot Chair/bed transfer assist level: Moderate assist (Pt 50 - 74%/lift or lower) Chair/bed transfer assistive device: Armrests Chair/bed transfer details: Verbal cues for technique, Verbal cues for sequencing, Manual facilitation for weight shifting, Tactile cues for weight shifting   Function - Locomotion: Wheelchair Will patient use wheelchair at discharge?: Yes Type: Manual Max wheelchair distance: 100 Assist Level: Touching or steadying assistance (Pt > 75%) Wheel 50 feet with 2 turns activity did not occur: Safety/medical concerns (fatigue) Assist Level: Touching or steadying assistance (Pt > 75%) Wheel 150 feet activity did not occur: Safety/medical concerns (fatigue) Assist Level: Supervision or verbal cues Turns around,maneuvers to table,bed, and toilet,negotiates 3% grade,maneuvers on rugs and over doorsills: No Function - Locomotion: Ambulation Ambulation activity did not occur: Safety/medical concerns (lack of sensation) Assistive device: Rail in hallway Max distance: 25 Assist level: Moderate assist (Pt 50 - 74%) Walk 10 feet activity did not occur: Safety/medical concerns Assist level: Moderate assist (Pt 50 - 74%) Walk 50 feet with 2 turns activity did not occur: Safety/medical concerns Walk 150 feet activity did not occur: Safety/medical concerns Walk 10 feet on uneven surfaces activity did not occur:  Safety/medical concerns  Function - Comprehension Comprehension: Auditory Comprehension assist level: Understands basic 75 - 89% of the time/ requires cueing 10 - 24% of the time  Function - Expression Expression: Verbal Expression assist level: Expresses basic 25 - 49% of the time/requires cueing 50 - 75% of the time. Uses single words/gestures.  Function - Social Interaction Social Interaction assist level: Interacts appropriately 90% of the time - Needs monitoring or encouragement for participation or interaction.  Function - Problem Solving Problem solving assist level: Solves basic 25 - 49% of the time - needs direction more than half the time to initiate, plan or complete simple activities  Function - Memory Memory assist level: Recognizes or recalls 50 - 74% of the time/requires cueing 25 - 49% of the time Patient normally able to recall (first 3 days only): Current season  Medical Problem List and Plan: 1.  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  DVT Prophylaxis/Anticoagulation: Subcutaneous Lovenox. Monitor platelet counts and any signs of bleeding, plt 302K 3. Pain Management: Tylenol as needed. Pain controlled at present 4. Hypertension. Norvasc 5 mg daily, lisinopril 20 mg daily. 126/80 Monitor with increased mobility 5. Neuropsych: This patient is capable of making decisions on his own behalf. 6. Skin/Wound Care: Routine skin checks. OOB. Encourage appropriate nutrition 7. Fluids/Electrolytes/Nutrition: Routine I&O's with follow-up chemistries in AM 8. Hyperlipidemia. Lipitor 9. Tobacco/ cocaine/alcohol abuse. Provide counseling during rehab stay-  10.  Hypoalbuminemia- mild will supplement po 11. Spasticity R UE and LE  now on Zanaflex,increase to 38m QID  monitor BP,add resting hand splint LOS (Days) 14 A FACE TO FACE  EVALUATION WAS PERFORMED  Mendi Constable E 07/22/2015, 7:30 AM

## 2015-07-22 NOTE — Progress Notes (Signed)
Speech Language Pathology Daily Session Note  Patient Details  Name: Gerald [name] MRN: [id number] Date of Birth: [birth year]  Today's Date: 07/22/2015 SLP Individual Time: [id number] SLP Individual Time Calculation (min): 45 min  Short Term Goals: Week 2: SLP Short Term Goal 1 (Week 2): Pt will demonstrate sentence comprehension with 80% acc with supervision  SLP Short Term Goal 2 (Week 2): Pt will follow 3-step directives with 75% acc min A. SLP Short Term Goal 3 (Week 2): Pt will communicate basic needs/wants via phrase level verbal expression with mod  A. SLP Short Term Goal 4 (Week 2): Pt will demonstrate recogition of 3-5 objects given a 5 min delay with min A.  Skilled Therapeutic Interventions: Skilled treatment session focused on functional communication. SLP facilitated session by providing multiple repititions and extra time for following 2 step directions with manipulatives from a field of four with 100% accuracy. SLP also facilitated by providing Max A multimodal cues for increasing his length of utterance to 2-3 words during a structured but informal verbal expression task. Patient left supine in bed with all needs within reach. Continue with current plan of care.     Function:   Cognition Comprehension Comprehension assist level: Understands basic 75 - 89% of the time/ requires cueing 10 - 24% of the time  Expression   Expression assist level: Expresses basic 25 - 49% of the time/requires cueing 50 - 75% of the time. Uses single words/gestures.  Social Interaction Social Interaction assist level: Interacts appropriately 90% of the time - Needs monitoring or encouragement for participation or interaction.  Problem Solving Problem solving assist level: Solves basic 25 - 49% of the time - needs direction more than half the time to initiate, plan or complete simple activities  Memory Memory assist level: Recognizes or recalls 50 - 74% of the time/requires cueing 25 - 49% of the  time    Pain Pain Assessment Pain Assessment: No/denies pain  Therapy/Group: Individual Therapy  Gerald [name] 07/22/2015, 4:15 PM

## 2015-07-22 NOTE — Plan of Care (Signed)
Problem: RH PAIN MANAGEMENT Goal: RH STG PAIN MANAGED AT OR BELOW PT'S PAIN GOAL <4  Outcome: Progressing No c/o pain     

## 2015-07-23 ENCOUNTER — Inpatient Hospital Stay (HOSPITAL_COMMUNITY): Payer: Medicaid Other | Admitting: Speech Pathology

## 2015-07-23 ENCOUNTER — Inpatient Hospital Stay (HOSPITAL_COMMUNITY): Payer: Medicaid Other | Admitting: Occupational Therapy

## 2015-07-23 ENCOUNTER — Inpatient Hospital Stay (HOSPITAL_COMMUNITY): Payer: Self-pay

## 2015-07-23 DIAGNOSIS — L739 Follicular disorder, unspecified: Secondary | ICD-10-CM

## 2015-07-23 MED ORDER — TIZANIDINE HCL 4 MG PO TABS
4.0000 mg | ORAL_TABLET | Freq: Three times a day (TID) | ORAL | Status: DC
Start: 2015-07-23 — End: 2015-07-25
  Administered 2015-07-23 – 2015-07-24 (×7): 4 mg via ORAL
  Filled 2015-07-23 (×8): qty 1

## 2015-07-23 MED ORDER — METHYLPHENIDATE HCL 5 MG PO TABS
5.0000 mg | ORAL_TABLET | Freq: Two times a day (BID) | ORAL | Status: DC
  Administered 2015-07-23 – 2015-07-31 (×17): 5 mg via ORAL
  Filled 2015-07-23 (×18): qty 1

## 2015-07-23 MED ORDER — DOXYCYCLINE HYCLATE 100 MG PO TABS
100.0000 mg | ORAL_TABLET | Freq: Two times a day (BID) | ORAL | Status: DC
  Administered 2015-07-23 – 2015-07-24 (×3): 100 mg via ORAL
  Filled 2015-07-23 (×3): qty 1

## 2015-07-23 NOTE — Progress Notes (Signed)
Subjective/Complaints: RN note RIght thigh draining lesion, pt doesn't recall how this developed ROS- limited by cognition and speech Objective: Vital Signs: Blood pressure 136/82, pulse 93, temperature 98.6 F (37 C), temperature source Oral, resp. rate 18, height 6' (1.829 m), weight 80.06 kg (176 lb 8 oz), SpO2 98 %. No results found. Results for orders placed or performed during the hospital encounter of 07/08/15 (from the past 72 hour(s))  Creatinine, serum     Status: None   Collection Time: 07/22/15  6:26 AM  Result Value Ref Range   Creatinine, Ser 0.96 0.61 - 1.24 mg/dL   GFR calc non Af Amer >60 >60 mL/min   GFR calc Af Amer >60 >60 mL/min    Comment: (NOTE) The eGFR has been calculated using the CKD EPI equation. This calculation has not been validated in all clinical situations. eGFR's persistently <60 mL/min signify possible Chronic Kidney Disease.      HEENT: normal and poor dentition Cardio: RRR and no murmur Resp: RRR and no murmur GI: BS positive and NT, ND Extremity:  Pulses positive and No Edema Skin:  Pustule R Lateral thigh, open in center with small amt yellow-brown drainage Neuro: oriented to person and GBO not Cone or time, Flat, Abnormal Sensory absent LT in RUE and RLE, intact on left side, Abnormal Motor 2- R arm ext, 4-/5 grip with persistent grasp, 3-/5 finger and wrist ext, trace R hip ext, Dysarthric and Other naming intact but decreased fluency, no flexor withdrawal, MAS 2 R hand , pesistent grasp Musc/Skel:  Other no pain with UE or LE ROM Gen NAD   Assessment/Plan: 1. Functional deficits secondary to Left ACA infarct with Right HP which require 3+ hours per day of interdisciplinary therapy in a comprehensive inpatient rehab setting. Physiatrist is providing close team supervision and 24 hour management of active medical problems listed below. Physiatrist and rehab team continue to assess barriers to discharge/monitor patient progress toward  functional and medical goals. FIM: Function - Bathing Position: Shower Body parts bathed by patient: Chest, Right arm, Abdomen, Front perineal area, Right upper leg, Left upper leg Body parts bathed by helper: Left arm, Buttocks, Right lower leg, Left lower leg, Back Assist Level: Touching or steadying assistance(Pt > 75%)  Function- Upper Body Dressing/Undressing What is the patient wearing?: Pull over shirt/dress Pull over shirt/dress - Perfomed by patient: Thread/unthread left sleeve, Put head through opening Pull over shirt/dress - Perfomed by helper: Pull shirt over trunk, Thread/unthread right sleeve Assist Level: Supervision or verbal cues Function - Lower Body Dressing/Undressing What is the patient wearing?: Socks, Shoes, Pants, Ted Hose Position: Bed Underwear - Performed by patient: Thread/unthread left underwear leg Underwear - Performed by helper: Thread/unthread right underwear leg, Pull underwear up/down Pants- Performed by patient: Thread/unthread left pants leg, Pull pants up/down Pants- Performed by helper: Thread/unthread right pants leg, Thread/unthread left pants leg, Pull pants up/down Non-skid slipper socks- Performed by helper: Don/doff right sock, Don/doff left sock Socks - Performed by patient: Don/doff right sock, Don/doff left sock Socks - Performed by helper: Don/doff right sock, Don/doff left sock Shoes - Performed by patient: Don/doff left shoe, Fasten left Shoes - Performed by helper: Fasten right, Don/doff right shoe TED Hose - Performed by patient: Don/doff right TED hose, Don/doff left TED hose TED Hose - Performed by helper: Don/doff right TED hose, Don/doff left TED hose Assist for footwear: Supervision/touching assist Assist for lower body dressing: Touching or steadying assistance (Pt > 75%)  Function -  Toileting Toileting activity did not occur: No continent bowel/bladder event Toileting steps completed by patient: Adjust clothing prior to  toileting Toileting steps completed by helper: Adjust clothing prior to toileting, Performs perineal hygiene, Adjust clothing after toileting Toileting Assistive Devices: Grab bar or rail Assist level: Two helpers  Function - Air cabin crew transfer activity did not occur: Safety/medical concerns Toilet transfer assistive device: Elevated toilet seat/BSC over toilet Assist level to toilet: Moderate assist (Pt 50 - 74%/lift or lower) Assist level from toilet: Maximal assist (Pt 25 - 49%/lift and lower) Assist level to bedside commode (at bedside): 2 helpers Assist level from bedside commode (at bedside): 2 helpers  Function - Chair/bed transfer Chair/bed transfer method: Squat pivot Chair/bed transfer assist level: Moderate assist (Pt 50 - 74%/lift or lower) Chair/bed transfer assistive device: Armrests Chair/bed transfer details: Verbal cues for technique, Verbal cues for sequencing, Manual facilitation for weight shifting, Tactile cues for weight shifting   Function - Locomotion: Wheelchair Will patient use wheelchair at discharge?: Yes Type: Manual Max wheelchair distance: 100 Assist Level: Touching or steadying assistance (Pt > 75%) Wheel 50 feet with 2 turns activity did not occur: Safety/medical concerns (fatigue) Assist Level: Touching or steadying assistance (Pt > 75%) Wheel 150 feet activity did not occur: Safety/medical concerns (fatigue) Assist Level: Supervision or verbal cues Turns around,maneuvers to table,bed, and toilet,negotiates 3% grade,maneuvers on rugs and over doorsills: No Function - Locomotion: Ambulation Ambulation activity did not occur: Safety/medical concerns (lack of sensation) Assistive device: Rail in hallway Max distance: 25 Assist level: Moderate assist (Pt 50 - 74%) Walk 10 feet activity did not occur: Safety/medical concerns Assist level: Moderate assist (Pt 50 - 74%) Walk 50 feet with 2 turns activity did not occur: Safety/medical  concerns Walk 150 feet activity did not occur: Safety/medical concerns Walk 10 feet on uneven surfaces activity did not occur: Safety/medical concerns  Function - Comprehension Comprehension: Auditory Comprehension assist level: Understands basic 75 - 89% of the time/ requires cueing 10 - 24% of the time  Function - Expression Expression: Verbal Expression assist level: Expresses basic 25 - 49% of the time/requires cueing 50 - 75% of the time. Uses single words/gestures.  Function - Social Interaction Social Interaction assist level: Interacts appropriately 75 - 89% of the time - Needs redirection for appropriate language or to initiate interaction.  Function - Problem Solving Problem solving assist level: Solves basic 25 - 49% of the time - needs direction more than half the time to initiate, plan or complete simple activities  Function - Memory Memory assist level: Recognizes or recalls 50 - 74% of the time/requires cueing 25 - 49% of the time Patient normally able to recall (first 3 days only): Current season  Medical Problem List and Plan: 1.  Right hemiparesis, Right hemisensory deficits and dysarthria secondary to left ACA territory infarct-expect UE>LE recovery ,cont rehab Team conference today please see physician documentation under team conference tab, met with team face-to-face to discuss problems,progress, and goals. Formulized individual treatment plan based on medical history, underlying problem and comorbidities.                                2.  DVT Prophylaxis/Anticoagulation: Subcutaneous Lovenox. Monitor platelet counts and any signs of bleeding, plt 302K 3. Pain Management: Tylenol as needed. Pain controlled at present 4. Hypertension. Norvasc 5 mg daily, lisinopril 20 mg daily. 136/82 Monitor with increased mobility 5. Neuropsych: This patient  is capable of making decisions on his own behalf. 6. Skin/Wound Care: Routine skin checks. OOB. Encourage appropriate  nutrition 7. Fluids/Electrolytes/Nutrition: Routine I&O's with follow-up chemistries in AM 8. Hyperlipidemia. Lipitor 9. Tobacco/ cocaine/alcohol abuse. Provide counseling during rehab stay-  10.  Hypoalbuminemia- mild will supplement po 11. Spasticity R UE and LE now on Zanaflex,increase to 8m QID  monitor BP,add resting hand splint 12.  Right thigh pustule, ?folliculitis vs abrasion with subsequent infx start doxy to cover skin pathogens, DSD change daily LOS (Days) 15 A FACE TO FACE EVALUATION WAS PERFORMED  Shemaiah Round E 07/23/2015, 7:37 AM

## 2015-07-23 NOTE — Progress Notes (Signed)
Occupational Therapy Session Note  Patient Details  Name: Gerald [name] MRN: [id number] Date of Birth: 1961/03/08  Today's Date: 07/23/2015 OT Individual Time: 1130-1200 OT Individual Time Calculation (min): 30 min    Short Term Goals: Week 2:  OT Short Term Goal 1 (Week 2): Pt will maintain dynamic sitting balance during bathing tasks with mod assist. OT Short Term Goal 2 (Week 2): Pt will complete UB dressing in supported sitting with supervision. OT Short Term Goal 3 (Week 2): Pt will complete LB dressing with mod assist sit to stand. OT Short Term Goal 4 (Week 2): Pt will use the RUE as a stabilizer with mod assist during grooming and bathing tasks.   OT Short Term Goal 5 (Week 2): Pt will perform toilet transfer to the [facility] with mod assist stand pivot.  Skilled Therapeutic Interventions/Progress Updates:    Treatment session with focus on trunk control, midline orientation, and RUE NMR.  Squat pivot transfer to Rt onto therapy mat with mod assist with tactile cues for anterior weight shift and head/hips relationship.  Engaged in sit > stand with use of mirror for visual feedback for midline standing.  Pt with significant pushing to Rt with sit > stand and in standing, attempted placing Lt hand across chest to decrease pushing with pt demonstrating decreased weight shift and increase in frustration.  RUE NMR with PROM and stretching to promote weight bearing through RUE on mat while challenging trunk control with reaching with Lt.  Squat pivot to Lt back to w/c with mod-max assist, but no pushing with LUE.  Therapy Documentation Precautions:  Precautions Precautions: Fall Precaution Comments: dense right hemiparesis Restrictions Weight Bearing Restrictions: No Pain: Pain Assessment Pain Assessment: No/denies pain  See Function Navigator for Current Functional Status.   Therapy/Group: Individual Therapy  [name] 07/23/2015, 12:22 PM

## 2015-07-23 NOTE — Patient Care Conference (Signed)
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 Right hemiparesis (Gerald [name]) 07/08/2015  . Acute left ACA ischemic stroke (Gerald [name]) 07/08/2015  . Cerebrovascular accident (CVA) (Gerald [name])   . Stroke (Gerald [name]) 07/02/2015  . Acute ischemic stroke (Gerald [name]) 07/02/2015  . CVA (cerebral infarction) 01/15/2013  . Essential hypertension 01/15/2013  . Nicotine dependence 01/15/2013  . Alcohol abuse 01/15/2013    Expected Discharge Date: Expected Discharge Date: 08/06/15  Team Members Present: Physician leading conference: Dr. Alysia Penna Social Worker Present: Ovidio Kin, LCSW Nurse Present: Dorien Chihuahua, RN PT Present: Georjean Mode, PT OT Present: Clyda Greener, OT SLP Present: Weston Anna, SLP PPS Coordinator present : Daiva Nakayama, RN, CRRN     Current Status/Progress Goal Weekly Team Focus  Medical   Skin area right thigh, started on antibiotics, right-sided spasticity increasing  Home versus SNF depending on caregiver  Medication adjustment for spasticity, initiation of oral antibiotic agents   Bowel/Bladder   Incontinent of bowel and bladder. LBM 07/22/15  Decreased incontinent episodes  Continue w/time toileting   Swallow/Nutrition/ Hydration      na        ADL's   min assist for UB bathing with min to mod for UB dressing, max assist for LB dressing sit to stand with max assist for stand pivot transfers.  increased tone noted in the RUE with decreased motor planning as well.  Only minimal movement noted in the shoulder and elbow flexors  min assist level overall   neuromuscular re-education, selfcare re-training, balance re-training, pt/family education, therapeutic exercise   Mobility   mod/max A for transfers, gait at railing in hallway max A  supervision transfers  NMR, balance, education   Communication   Min A for comprehension and Max A for verbal expression  min assist   verbal expression at the phrase levele, multi-step commands    Safety/Cognition/ Behavioral Observations  Min-Mod A  min-supervision for basic   awareness and attention    Pain   No c/o pain  <3  Monitor for effectiveness   Skin   Blister x 2 to L buttocks, Tegaderm to area contain drainage  No additonal skin breakdown  Encourage pt to call staff for turn q2hrs      *See Care Plan and progress notes for long and short-term goals.  Barriers to Discharge: Severe right lower extremity weakness with decreased mobility, history of drug abuse    Possible Resolutions to Barriers:  Continue rehabilitation program    Discharge Planning/Teaching Needs:  Home with sister and girlfriend to assist when not working. Will need to coem in and attend therapies with pt, to see the amount of assistance pt will require      Team Discussion:  Nauseaous in am-MD to  look into. Increased tone in R-UE. Two step commands better. Very slow progress, will need to downgrade his goals to min/mod level of assist at a wheelchair level, not ambulating with family member.Very limited carryover and no sensation in right-affected side. Trial of ritalin-see if helps. Leans to the right but can not catch himself. Have family come in to see if can provide the care pt will require at home   Revisions to Treatment Plan:  Downgrade goals to min/mod assist-wheelchair level   Continued Need for Acute Rehabilitation Level of Care: The patient requires daily medical management by a physician with specialized training in physical medicine and rehabilitation for the following conditions: Daily direction of a multidisciplinary physical rehabilitation program to ensure safe treatment while eliciting the highest outcome that is of practical value to the patient.: Yes Daily medical management of patient stability for increased activity during participation in an intensive rehabilitation regime.: Yes Daily analysis of laboratory values and/or radiology reports with any subsequent need for medication adjustment of medical intervention for : Mood/behavior problems;Neurological problems;Wound care problems  Gerald [name] 07/23/2015, 12:59 PM

## 2015-07-23 NOTE — Progress Notes (Signed)
Occupational Therapy Weekly Progress Note  Patient Details  Name: Gerald [name] MRN: [id number] Date of Birth: [birth year]  Beginning of progress report period: July 17, 2015 End of progress report period: July 23, 2015  Today's Date: 07/23/2015 OT Individual Time: 3818-2993 OT Individual Time Calculation (min): 46 min    Patient has met 0 of 5 short term goals.  Pt currently still needs max assist for functional transfers to the toilet and shower as well as max assist to manage clothing and hygiene sit to stand as well.  RUE functional is still at a minimal level with Mr. Vandevoort demonstrating increased flexor tone in the digits and elbow at this time.  He continues to need max hand over hand assistance to integrate into simple bathing tasks.  Motor planning difficulties are also present as at times he can open and close the hand fully but not on a consistent basis.  Most occasions he can demonstrate gross digit flexion but not extension.  Alien arm syndrome also noted that if an object is put in his hand he will continue to grasp it more if you try to remove it.  Decreased carryover of hemi techniques from session to session requiring mod to max questioning cueing for him to problem solve dressing most of the time.  Decreased dynamic sitting balance is also still present as he needs max assist to correct LOB to the right during bathing tasks.  Motor planning deficits continue to limit Mr. Cerreta with all tasks at this time.  Feel he has more movement than we are currently seeing it just needs further intensive rehab techniques to bring it out.  Recommend continued OT treatment to further progress to a min to mod assist level for discharge home with family.    Patient continues to demonstrate the following deficits: decreased balance, decreased motor planning, decreased RUE functional use, decreased memory, and therefore will continue to benefit from skilled OT intervention to enhance overall  performance with BADL.  Patient progressing toward long term goals..  Continue plan of care.  OT Short Term Goals Week 3:  OT Short Term Goal 1 (Week 3): Pt will maintain dynamic sitting balance during bathing tasks with mod assist. OT Short Term Goal 2 (Week 3): Pt will complete UB dressing in supported sitting with supervision. OT Short Term Goal 3 (Week 3): Pt will complete LB dressing with mod assist sit to stand. OT Short Term Goal 4 (Week 3): Pt will use the RUE as a stabilizer with mod assist during grooming and bathing tasks.   OT Short Term Goal 5 (Week 3): Pt will perform toilet transfer to the [facility] with mod assist stand pivot.  Skilled Therapeutic Interventions/Progress Updates:    Utilized NMES during session for the right digit extensors and elbow extensors.  Pt tolerated 20 mins of stimulation with intensity at the hand on level 25 and at the triceps on level 33.  Had pt focus on straightening elbow and digits as stimulation was activated for 5 seconds on and 5 seconds off.  Custom program used with settings at 50 pps, asymetrical wave form, and 300 pulse.  Pt able to tolerate with no adverse reactions.  He completed stand pivot transfers to and from the mat to wheelchair before and after stimulation with max assist.  Pt left in wheelchair at end of session with call button and phone within reach.  Safety belt also in place.    Therapy Documentation Precautions:  Precautions Precautions: Fall Precaution  Comments: dense right hemiparesis Restrictions Weight Bearing Restrictions: No  Vital Signs: Therapy Vitals Temp: 98.6 F (37 C) Temp Source: Oral Pulse Rate: 90 Resp: 16 BP: 111/66 mmHg Patient Position (if appropriate): Lying Oxygen Therapy SpO2: 100 % O2 Device: Not Delivered Pain: Pain Assessment Pain Assessment: No/denies pain ADL: See Function Navigator for Current Functional Status.   Therapy/Group: Individual Therapy  Trisha Ken OTR/L 07/23/2015, 4:29  PM

## 2015-07-23 NOTE — Progress Notes (Signed)
Physical Therapy Session Note  Patient Details  Name: Gerald [name] MRN: [id number] Date of Birth: 02-Nov-1960  Today's Date: 07/23/2015 PT Individual Time:0905-1005, 60 min  -     Short Term Goals: Week 2:  PT Short Term Goal 1 (Week 2): pt will perform functional transfers with max A PT Short Term Goal 1 - Progress (Week 2): Met PT Short Term Goal 2 (Week 2): Pt will perform sit to stand with max A PT Short Term Goal 2 - Progress (Week 2): Met PT Short Term Goal 3 (Week 2): pt will perform bed mobility with mod A PT Short Term Goal 3 - Progress (Week 2): Progressing toward goal  Skilled Therapeutic Interventions/Progress Updates:  Bed mobility for hygiene change in bed; improved following of directions for rolling and sitting up. Attempted squat pivot to L iwht L hand on w/c L armrest, but pt unable to motor plan; unsafe.  Slide board transfer with mod assist.  W/c propulsion using hemi method x 100' with rare VC for steering. Up/down 8% grade in hallway to [address] with min assist to initiate.  X 2 descending with improved understanding of using L hand as brake on wheel rim.    neuromuscular re-education via mirror feedback, overflow, demo, manual cues for RLE PROM hip and knee.  Pt has significant hypertonus RUE and RLE. Limiting balance and movement. RLE knee ext with bil feet on Maxislide for reduced friction, in sitting in w/c.  No active movement noted RLE. Gait in hallway using rail, shoe cover and R blue rocker AFO on, max/total assist with w/c follow. Pt tends to push to R with L hand on rail, but is able to control it with cueing.  RT returned pt to his room. Quick release belt donned.     Therapy Documentation Precautions:  Precautions Precautions: Fall Precaution Comments: dense right hemiparesis Restrictions Weight Bearing Restrictions: No   Vital Signs: Therapy Vitals BP: 140/90 mmHg Pain: Pain Assessment Pain Assessment: No/denies pain       See Function  Navigator for Current Functional Status.   Therapy/Group: Individual Therapy  Berlie Hatchel 07/23/2015, 9:49 AM

## 2015-07-23 NOTE — Progress Notes (Addendum)
Speech Language Pathology Weekly Progress and Session Note  Patient Details  Name: Gerald [name] MRN: [id number] Date of Birth: Jul 14, 1960  Beginning of progress report period: July 16, 2015 End of progress report period: July 23, 2015  Today's Date: 07/23/2015 SLP Individual Time: 0800-0900 SLP Individual Time Calculation (min): 60 min  Short Term Goals: Week 2: SLP Short Term Goal 1 (Week 2): Pt will demonstrate sentence comprehension with 80% acc with supervision  SLP Short Term Goal 1 - Progress (Week 2): Met SLP Short Term Goal 2 (Week 2): Pt will follow 3-step directives with 75% acc min A. SLP Short Term Goal 2 - Progress (Week 2): Not met SLP Short Term Goal 3 (Week 2): Pt will communicate basic needs/wants via phrase level verbal expression with mod  A. SLP Short Term Goal 3 - Progress (Week 2): Not met SLP Short Term Goal 4 (Week 2): Pt will demonstrate recogition of 3-5 objects given a 5 min delay with min A. SLP Short Term Goal 4 - Progress (Week 2): Met    New Short Term Goals: Week 3: SLP Short Term Goal 1 (Week 3): Pt will follow 3-step directives with 75% accuracy and min A verbal cues.  SLP Short Term Goal 2 (Week 3): Pt will communicate basic needs/wants via phrase level verbal expression with mod  A. SLP Short Term Goal 3 (Week 3): Patient will self-monitor and correct verbal errors within structured tasks with Mod A verbal and visual cues.  SLP Short Term Goal 4 (Week 3): Patient will utilize call bell to request assistance in 25% of opportunities with Max A verbal and question cues.  SLP Short Term Goal 5 (Week 3): Patient will demonstrate selective attention to a funcitonal task in a mildly distracting enviornment for 30 minutes with Min A verbal cues for redirection.  SLP Short Term Goal 6 (Week 3): Patient will recall/carryover new information in regards to technique for transfers, ADL's etc, with Mod A question and verbal cues.   Weekly Progress Updates:  Patient has made functional gains and has met 2 of 4 STG's this reporting period due to improved functional communication. Currently, patient requires overall Max A multimodal cues for functional problem solving, recall/carryover of new information and awareness which is also impacted by decreased motor planning. Patient also requires supervision-Min A verbal cues for selective attention to task in a mildly distracting environment for ~15 minutes. Patient can follow 2 step commands with extra time and repetition and requires Max A multimodal cues for following 3 step commands which is also impacted by motor planning. Patient requires overall Mod A for verbal expression at the phrase level during structured tasks and Max A multimodal cues for verbal expression at the phrase level to express basic wants/needs and during informal conversation. Patient and family education is ongoing. Patient would benefit from continued SLP services to maximize cognitive-linguistic function and overall functional independence prior to discharge home.    Intensity: Minumum of 1-2 x/day, 30 to 90 minutes Frequency: 3 to 5 out of 7 days Duration/Length of Stay: 08/06/15 Treatment/Interventions: Cognitive remediation/compensation;Multimodal communication approach;Speech/Language facilitation;Functional tasks;Therapeutic Activities;Therapeutic Exercise;Patient/family education;Environmental Environmental consultant;Internal/external aids   Daily Session  Skilled Therapeutic Interventions: Skilled treatment session focused on functional communication. SLP facilitated session by providing Min-Mod A verbal and phonemic cues for patient to self-monitor and correct errors during a structured verbal expression task at the sentence level while utilizing pictures. SLP also provided Min A question cues for reading comprehension at the sentence level  with 100% accuracy and for verbal expression at the phrase and sentence level (~3-4 words)  during an informal conversation in regards to wants/needs. Patient left supine in bed with all needs within reach. Continue with current plan of care.      Function:   Cognition Comprehension Comprehension assist level: Understands basic 75 - 89% of the time/ requires cueing 10 - 24% of the time  Expression   Expression assist level: Expresses basic 25 - 49% of the time/requires cueing 50 - 75% of the time. Uses single words/gestures.  Social Interaction Social Interaction assist level: Interacts appropriately 90% of the time - Needs monitoring or encouragement for participation or interaction.  Problem Solving Problem solving assist level: Solves basic 25 - 49% of the time - needs direction more than half the time to initiate, plan or complete simple activities  Memory Memory assist level: Recognizes or recalls 50 - 74% of the time/requires cueing 25 - 49% of the time   Pain Pain Assessment Pain Assessment: No/denies pain  Therapy/Group: Individual Therapy  Maleke Feria 07/23/2015, 12:56 PM

## 2015-07-24 ENCOUNTER — Inpatient Hospital Stay (HOSPITAL_COMMUNITY): Payer: Medicaid Other | Admitting: Speech Pathology

## 2015-07-24 ENCOUNTER — Inpatient Hospital Stay (HOSPITAL_COMMUNITY): Payer: Medicaid Other | Admitting: Occupational Therapy

## 2015-07-24 ENCOUNTER — Inpatient Hospital Stay (HOSPITAL_COMMUNITY): Payer: Self-pay | Admitting: Occupational Therapy

## 2015-07-24 ENCOUNTER — Ambulatory Visit (HOSPITAL_COMMUNITY): Payer: Self-pay | Admitting: Occupational Therapy

## 2015-07-24 ENCOUNTER — Inpatient Hospital Stay (HOSPITAL_COMMUNITY): Payer: Self-pay

## 2015-07-24 DIAGNOSIS — L0231 Cutaneous abscess of buttock: Secondary | ICD-10-CM

## 2015-07-24 LAB — CBC WITH DIFFERENTIAL/PLATELET
Basophils Absolute: 0 10*3/uL (ref 0.0–0.1)
Basophils Relative: 0 %
Eosinophils Absolute: 0.2 10*3/uL (ref 0.0–0.7)
Eosinophils Relative: 1 %
HCT: 38.8 % — ABNORMAL LOW (ref 39.0–52.0)
Hemoglobin: 12.5 g/dL — ABNORMAL LOW (ref 13.0–17.0)
Lymphocytes Relative: 15 %
Lymphs Abs: 2.4 10*3/uL (ref 0.7–4.0)
MCH: 30.6 pg (ref 26.0–34.0)
MCHC: 32.2 g/dL (ref 30.0–36.0)
MCV: 95.1 fL (ref 78.0–100.0)
Monocytes Absolute: 1.8 10*3/uL — ABNORMAL HIGH (ref 0.1–1.0)
Monocytes Relative: 11 %
Neutro Abs: 12.3 10*3/uL — ABNORMAL HIGH (ref 1.7–7.7)
Neutrophils Relative %: 73 %
Platelets: 381 10*3/uL (ref 150–400)
RBC: 4.08 MIL/uL — ABNORMAL LOW (ref 4.22–5.81)
RDW: 12.5 % (ref 11.5–15.5)
WBC: 16.7 10*3/uL — ABNORMAL HIGH (ref 4.0–10.5)

## 2015-07-24 MED ORDER — HYDROCODONE-ACETAMINOPHEN 5-325 MG PO TABS
1.0000 | ORAL_TABLET | Freq: Four times a day (QID) | ORAL | Status: AC | PRN
  Administered 2015-07-24: 1 via ORAL
  Administered 2015-07-25 – 2015-07-26 (×2): 2 via ORAL
  Filled 2015-07-24: qty 1
  Filled 2015-07-24 (×2): qty 2

## 2015-07-24 MED ORDER — LIDOCAINE-EPINEPHRINE 1 %-1:100000 IJ SOLN
20.0000 mL | Freq: Once | INTRAMUSCULAR | Status: AC
  Administered 2015-07-24: 20 mL
  Filled 2015-07-24: qty 20

## 2015-07-24 NOTE — Plan of Care (Signed)
Problem: RH SKIN INTEGRITY Goal: RH STG MAINTAIN SKIN INTEGRITY WITH ASSISTANCE STG Maintain Skin Integrity With Assistance. Mod A  Outcome: Not Progressing Gauze to draining blister sites on right lateral thigh and left buttocks.

## 2015-07-24 NOTE — Progress Notes (Signed)
Occupational Therapy Session Note  Patient Details  Name: Gerald [name] MRN: [id number] Date of Birth: [birth year]  Today's Date: 07/24/2015 OT Individual Time: 1335-1405 OT Individual Time Calculation (min): 30 min    Skilled Therapeutic Interventions/Progress Updates:    NMR with focus on functional use of right UE. Pt with such success with closed chained activities with bathing this morning attempted to transition to open chained activities this pm. This included functional reach for objects, postural control, sitting balance with reaching away from BOS and across midline. Pt required minA against gravity and mod multimodal cues for motor initiation to reach for object and release object. Pt continues to require more than reasonable amt of time to process command and then execute.  Also continued focus on verbal communication on the sentence level with extra time and a topical prompt.    Therapy Documentation Precautions:  Precautions Precautions: Fall Precaution Comments: dense right hemiparesis Restrictions Weight Bearing Restrictions: No Pain: No c/o pain in session  See Function Navigator for Current Functional Status.   Therapy/Group: Individual Therapy  Willeen Cass [facility] 07/24/2015, 3:23 PM

## 2015-07-24 NOTE — Procedures (Signed)
Incision and Drainage Procedure Note  Pre-operative Diagnosis: left gluteal abscess and right posterior thigh abscess  Post-operative Diagnosis: same  Indications: 4-5 day history of gluteal and right right pain with purulent drainage.  Significant induration and purulent drainage to buttocks which is inadequately drained.  Right posterior thigh with about 1cm of fluctuance and purulent drainage.   Anesthesia: 1% lidocaine with epinephrine  Procedure Details  The procedure, risks and complications have been discussed in detail (including, but not limited to airway compromise, infection, bleeding) with the patient, and the patient has signed consent to the procedure.  The skin was sterilely prepped and draped over the affected area in the usual fashion. After adequate local anesthesia, I&D with a #11 blade was performed on the the left gluteus.  Purulent drainage was present which was evacuated.  3x3x2cm pocket. There is significant necrotic tissue at the base of the wound which I attempted to debride.  I did not appreciate any further pockets.  I then packed the wound with a moist 4x4 guaze.  I then changed gloves and prepped the right thigh.  After local anesthesia, I performed an I&D with a #11 blade.  Purulent drainage was present.  Wound was packed with a 2x2 guaze after hemostasis was observed.  The patient tolerated the procedure well and was observed until stable.   Findings: 3x3x2cm wound right gluteus  1x1x1cm left posterior thigh wound  EBL: <5 cc's  Drains: none  Condition: Tolerated procedure well and Stable   Complications: none.   Daxen Lanum, ANP-BC

## 2015-07-24 NOTE — Progress Notes (Signed)
Social Work Patient ID: Gerald [name], male   DOB: 12/30/1960, 54 y.o.   MRN: [id number] Met with pt and left messages for brenda-Girlfriend and [name]-sister to inform team conference progress toward his goals and the amount of care he will require At discharge. Pt will be wheelchair level and not ambulating with his family-therapy only due to his high risk to fall. Will await return call. Aware [name] has observed therapies When pt in on Sat. Work on discharge plans. 

## 2015-07-24 NOTE — Progress Notes (Signed)
Occupational Therapy Session Note  Patient Details  Name: Gerald [name] MRN: [id number] Date of Birth: 03-25-1961  Today's Date: 07/24/2015 OT Individual Time: 1415-1445 OT Individual Time Calculation (min): 30 min    Short Term Goals: Week 3:  OT Short Term Goal 1 (Week 3): Pt will maintain dynamic sitting balance during bathing tasks with mod assist. OT Short Term Goal 2 (Week 3): Pt will complete UB dressing in supported sitting with supervision. OT Short Term Goal 3 (Week 3): Pt will complete LB dressing with mod assist sit to stand. OT Short Term Goal 4 (Week 3): Pt will use the RUE as a stabilizer with mod assist during grooming and bathing tasks.   OT Short Term Goal 5 (Week 3): Pt will perform toilet transfer to the [facility] with mod assist stand pivot.  Skilled Therapeutic Interventions/Progress Updates:    Pt was taken down to the therapy gym for session and transferred to the therapy mat with max assist stand pivot.  Worked on trunk strengthening and RUE weightbearing while sitting on mat.  Increased flexor tone noted in the internal rotators of the humerus as well as the elbow flexors and the digit flexors.  Had him work on activation of elbow extensors while reaching across his body with the LUE and losing his balance.  No noted activation with triceps in this position however and pt needing max assist to regain balance with each interval.  Pt with grimmacing at times and unable to tell therapist where he was hurting.  Eventually, he was able to get out that his buttocks were hurting.  Pt transferred back to chair and rolled back to room at end of session.  Safety belt in place and call button within reach.    Therapy Documentation Precautions:  Precautions Precautions: Fall Precaution Comments: right hemiparesis with increased flexor tone Restrictions Weight Bearing Restrictions: No  Pain: Pain Assessment Pain Assessment: Faces Faces Pain Scale: Hurts a little bit Pain Type:  Acute pain Pain Location: Buttocks Pain Orientation: Left Pain Descriptors / Indicators: Discomfort;Grimacing Pain Intervention(s): Repositioned;Emotional support ADL: See Function Navigator for Current Functional Status.   Therapy/Group: Individual Therapy  Gladyse Corvin OTR/L 07/24/2015, 4:59 PM

## 2015-07-24 NOTE — Progress Notes (Signed)
Subjective/Complaints: RN notes additional RIght thigh draining lesion, pt doesn't recall how this developed ROS- limited by cognition and speech Objective: Vital Signs: Blood pressure 139/83, pulse 102, temperature 98.4 F (36.9 C), temperature source Oral, resp. rate 18, height 6' (1.829 m), weight 78.926 kg (174 lb), SpO2 97 %. No results found. Results for orders placed or performed during the hospital encounter of 07/08/15 (from the past 72 hour(s))  Creatinine, serum     Status: None   Collection Time: 07/22/15  6:26 AM  Result Value Ref Range   Creatinine, Ser 0.96 0.61 - 1.24 mg/dL   GFR calc non Af Amer >60 >60 mL/min   GFR calc Af Amer >60 >60 mL/min    Comment: (NOTE) The eGFR has been calculated using the CKD EPI equation. This calculation has not been validated in all clinical situations. eGFR's persistently <60 mL/min signify possible Chronic Kidney Disease.      HEENT: normal and poor dentition Cardio: RRR and no murmur Resp: RRR and no murmur GI: BS positive and NT, ND Extremity:  Pulses positive and No Edema Skin:  Pustule R Lateral thigh, open in center with small amt yellow-brown drainage Neuro: oriented to person and GBO not Cone or time, Flat, Abnormal Sensory absent LT in RUE and RLE, intact on left side, Abnormal Motor 2- R arm ext, 4-/5 grip with persistent grasp, 3-/5 finger and wrist ext, trace R hip ext, Dysarthric and Other naming intact but decreased fluency, no flexor withdrawal, MAS 2 R hand , pesistent grasp Musc/Skel:  Other no pain with UE or LE ROM Gen NAD   Assessment/Plan: 1. Functional deficits secondary to Left ACA infarct with Right HP which require 3+ hours per day of interdisciplinary therapy in a comprehensive inpatient rehab setting. Physiatrist is providing close team supervision and 24 hour management of active medical problems listed below. Physiatrist and rehab team continue to assess barriers to discharge/monitor patient  progress toward functional and medical goals. FIM: Function - Bathing Position: Shower Body parts bathed by patient: Chest, Right arm, Abdomen, Front perineal area, Right upper leg, Left upper leg Body parts bathed by helper: Left arm, Buttocks, Right lower leg, Left lower leg, Back Assist Level: Touching or steadying assistance(Pt > 75%)  Function- Upper Body Dressing/Undressing What is the patient wearing?: Pull over shirt/dress Pull over shirt/dress - Perfomed by patient: Thread/unthread left sleeve, Put head through opening Pull over shirt/dress - Perfomed by helper: Pull shirt over trunk, Thread/unthread right sleeve Assist Level: Supervision or verbal cues Function - Lower Body Dressing/Undressing What is the patient wearing?: Socks, Shoes, Pants, Ted Hose Position: Bed Underwear - Performed by patient: Thread/unthread left underwear leg Underwear - Performed by helper: Thread/unthread right underwear leg, Pull underwear up/down Pants- Performed by patient: Thread/unthread left pants leg, Pull pants up/down Pants- Performed by helper: Thread/unthread right pants leg, Thread/unthread left pants leg, Pull pants up/down Non-skid slipper socks- Performed by helper: Don/doff right sock, Don/doff left sock Socks - Performed by patient: Don/doff right sock, Don/doff left sock Socks - Performed by helper: Don/doff right sock, Don/doff left sock Shoes - Performed by patient: Don/doff left shoe, Fasten left Shoes - Performed by helper: Fasten right, Don/doff right shoe TED Hose - Performed by patient: Don/doff right TED hose, Don/doff left TED hose TED Hose - Performed by helper: Don/doff right TED hose, Don/doff left TED hose Assist for footwear: Supervision/touching assist Assist for lower body dressing: Touching or steadying assistance (Pt > 75%)  Function - Toileting  Toileting activity did not occur: No continent bowel/bladder event Toileting steps completed by patient: Adjust  clothing prior to toileting Toileting steps completed by helper: Adjust clothing prior to toileting, Performs perineal hygiene, Adjust clothing after toileting Toileting Assistive Devices: Grab bar or rail Assist level: Two helpers  Function - Air cabin crew transfer activity did not occur: Safety/medical concerns Toilet transfer assistive device: Elevated toilet seat/BSC over toilet Assist level to toilet: Moderate assist (Pt 50 - 74%/lift or lower) Assist level from toilet: Maximal assist (Pt 25 - 49%/lift and lower) Assist level to bedside commode (at bedside): 2 helpers Assist level from bedside commode (at bedside): 2 helpers  Function - Chair/bed transfer Chair/bed transfer method: Lateral scoot Chair/bed transfer assist level: Moderate assist (Pt 50 - 74%/lift or lower) Chair/bed transfer assistive device: Sliding board Chair/bed transfer details: Verbal cues for technique, Verbal cues for sequencing, Manual facilitation for weight shifting, Tactile cues for weight shifting   Function - Locomotion: Wheelchair Will patient use wheelchair at discharge?: Yes Type: Manual Max wheelchair distance: 100 Assist Level: Supervision or verbal cues Wheel 50 feet with 2 turns activity did not occur: Safety/medical concerns (fatigue) Assist Level: Supervision or verbal cues Wheel 150 feet activity did not occur: Safety/medical concerns (fatigue) Assist Level: Supervision or verbal cues Turns around,maneuvers to table,bed, and toilet,negotiates 3% grade,maneuvers on rugs and over doorsills: No Function - Locomotion: Ambulation Ambulation activity did not occur: Safety/medical concerns (lack of sensation) Assistive device: Rail in hallway, Orthosis Max distance: 15 Assist level: Total assist (Pt < 25%) Walk 10 feet activity did not occur: Safety/medical concerns Assist level: Total assist (Pt < 25%) Walk 50 feet with 2 turns activity did not occur: Safety/medical concerns Walk  150 feet activity did not occur: Safety/medical concerns Walk 10 feet on uneven surfaces activity did not occur: Safety/medical concerns  Function - Comprehension Comprehension: Auditory Comprehension assist level: Understands basic 75 - 89% of the time/ requires cueing 10 - 24% of the time  Function - Expression Expression: Verbal Expression assist level: Expresses basic 25 - 49% of the time/requires cueing 50 - 75% of the time. Uses single words/gestures.  Function - Social Interaction Social Interaction assist level: Interacts appropriately 90% of the time - Needs monitoring or encouragement for participation or interaction.  Function - Problem Solving Problem solving assist level: Solves basic 25 - 49% of the time - needs direction more than half the time to initiate, plan or complete simple activities  Function - Memory Memory assist level: Recognizes or recalls 50 - 74% of the time/requires cueing 25 - 49% of the time Patient normally able to recall (first 3 days only): Current season  Medical Problem List and Plan: 1.  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  DVT Prophylaxis/Anticoagulation: Subcutaneous Lovenox. Monitor platelet counts and any signs of bleeding, plt 302K 3. Pain Management: Tylenol as needed. Pain controlled at present 4. Hypertension. Norvasc 5 mg daily, lisinopril 20 mg daily. 139/83 Monitor with increased mobility 5. Neuropsych: This patient is capable of making decisions on his own behalf. 6. Skin/Wound Care: Routine skin checks. OOB. Encourage appropriate nutrition 7. Fluids/Electrolytes/Nutrition: Routine I&O's with follow-up chemistries in AM 8. Hyperlipidemia. Lipitor 9. Tobacco/ cocaine/alcohol abuse. Provide counseling  during rehab stay- cognitive deficits will limit effectiveness of co 10.   Hypoalbuminemia- mild will supplement po 11. Spasticity R UE and LE now on Zanaflex,increase to 37m QID  monitor BP,add resting hand splint 12.  Right thigh pustule as well as Left glut abscess, ?folliculitis vs abrasion with subsequent infx change doxy to vanc after wound culture,foam dressing change daily, ask gen surgery to eval LOS (Days) 1[place] 07/24/2015, 7:51 AM
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
## 2015-07-24 NOTE — Progress Notes (Addendum)
Physical Therapy Session Note  Patient Details  Name: Gerald [name] MRN: [id number] Date of Birth: 06-11-1961  Today's Date: 07/24/2015 PT Individual Time: 1125-1205 PT Individual Time Calculation (min): 40 min   Short Term Goals: Week 3:  Goal 1:pt will perform functional transfers with mod assist consistently Goal 2: pt will perform standing balance for therapeutic task with mod assist Goal 3: pt will perform supine>sit with mod assist  Skilled Therapeutic Interventions/Progress Updates:   Pt with wounds L buttock and R thigh, treated by RNs.  Awaiting culture.  Bed mobility with max cues for rolling before attempting to sit up.  Pt sat upright EOB with L hand in lap, maintaining static sitting balance with occasional cueing. +2 STedy bed> w/c with L hand supinated to decrease pushing.  Squat pivot w/c> mat to R with mod assist; stand pivot to L to return to w/c with mod/max initially, then +2 as pt had LOB R.  neuromuscular re-education via forced use, manual cues, VCS for RLE wt bearing in sit><stand and static standing, wearing R Blue Rocker AFO.  Pt able to slightly extend R knee as it buckled, with max multimodal cueing.  Therapeutic activity sit><stand x 18 reps from mat to match playing cards onto board in front of him, at head height. It standing, pt required max assist for RLE stance stability and trunk control.   Pt able to verbally describe cards by #, suit, position on board, etc with leading questions and encouragement to use 2-4 works for each description.  RT returned pt to room and left him resting in w/c, with all needs within reach, and quick release belt donned.  Pt will benefit from personal RAFO.     Therapy Documentation Precautions:  Precautions Precautions: Fall Precaution Comments: dense right hemiparesis Restrictions Weight Bearing Restrictions: No General: PT Amount of Missed Time (min): 5 Minutes PT Missed Treatment Reason: Nursing care (wound  care) Pain: none noted           See Function Navigator for Current Functional Status.   Therapy/Group: Individual Therapy  Juda Toepfer 07/24/2015, 12:12 PM

## 2015-07-24 NOTE — Progress Notes (Signed)
Occupational Therapy Session Note  Patient Details  Name: Gerald [name] MRN: [id number] Date of Birth: 10-14-1960  Today's Date: 07/24/2015 OT Individual Time: 0800-0900 OT Individual Time Calculation (min): 60 min    Short Term Goals: Week 3:  OT Short Term Goal 1 (Week 3): Pt will maintain dynamic sitting balance during bathing tasks with mod assist. OT Short Term Goal 2 (Week 3): Pt will complete UB dressing in supported sitting with supervision. OT Short Term Goal 3 (Week 3): Pt will complete LB dressing with mod assist sit to stand. OT Short Term Goal 4 (Week 3): Pt will use the RUE as a stabilizer with mod assist during grooming and bathing tasks.   OT Short Term Goal 5 (Week 3): Pt will perform toilet transfer to the [facility] with mod assist stand pivot.  Skilled Therapeutic Interventions/Progress Updates:    1:1 Pt in bed when arrived. Focus on bed mobility from supine, rolling to right and side lying to sitting with max A. Continued focus on postural control in unsupported sitting on EOB and dynamic sitting balance in w/c during bathing and dressing tasks. Pt required mod A with total multimodal cues for sequence, and body setup for squat pivot transfers. During bathing, strong focus on functional use of right UE in all functional tasks including bathing chest, left UE, bilateral thighs and lower LEs with mod A for sitting balance. Pt able to initiate hand and wrist extension (to open palm) during washing with washcloth with min A for support at wrist and elbow. Pt also able to initiate right elbow extension during functional tasks with min A and at times once therapist assisted with initiation of right UE movement- pt able ot follow through with extra time and encouragement. Pt continues to required max cuing for motor planning and problem solving with familiar ADL tasks especially with incorporating right UE.  Sit to stand with max A and tactile cues to maintain forward weight shift. Max A to  sustain static standing balance (with right knee supported) for pt to A with pulling up pants. Left pt with Rn to finish lunch.   Therapy Documentation Precautions:  Precautions Precautions: Fall Precaution Comments: dense right hemiparesis Restrictions Weight Bearing Restrictions: No Pain:  no c/o pain   See Function Navigator for Current Functional Status.   Therapy/Group: Individual Therapy  Willeen Cass [facility] 07/24/2015, 8:59 AM

## 2015-07-24 NOTE — Progress Notes (Signed)
Speech Language Pathology Daily Session Note  Patient Details  Name: Gerald [name] MRN: [id number] Date of Birth: Dec 20, 1960  Today's Date: 07/24/2015 SLP Individual Time: 1015-1045 SLP Individual Time Calculation (min): 30 min  Short Term Goals: Week 3: SLP Short Term Goal 1 (Week 3): Pt will follow 3-step directives with 75% accuracy and min A verbal cues.  SLP Short Term Goal 2 (Week 3): Pt will communicate basic needs/wants via phrase level verbal expression with mod  A. SLP Short Term Goal 3 (Week 3): Patient will self-monitor and correct verbal errors within structured tasks with Mod A verbal and visual cues.  SLP Short Term Goal 4 (Week 3): Patient will utilize call bell to request assistance in 25% of opportunities with Max A verbal and question cues.  SLP Short Term Goal 5 (Week 3): Patient will demonstrate selective attention to a funcitonal task in a mildly distracting enviornment for 30 minutes with Min A verbal cues for redirection.  SLP Short Term Goal 6 (Week 3): Patient will recall/carryover new information in regards to technique for transfers, ADL's etc, with Mod A question and verbal cues.   Skilled Therapeutic Interventions:  Session began late due to wound care with surgical nurse practitioner for assessment of wounds on buttocks and thigh.  RN in room at time of SLP's arrival and aware of pt's increased pain level due to wound assessment.  Pt was agreeable to participating in therapies.  Skilled treatment session targeting communication goals.  Pt was able to describe actions in pictures at the phrase and sentence level with max faded to mod assist verbal cues for expansion of utterances and correct production of targeted words.  SLP also reviewed and reinforced use of call bell as pt continues to present with decreased initiation of functional communication to convey needs and wants to caregivers.  Pt was able to generate and repeatedly produce a functional phrase to convey  toileting needs to nursing with supervision cues in during structured practice.  SLP left a visual aid in pt's room to remind pt to initiate calling for help rather than waiting for someone to come to his room.  Pt left in bed with call bell within reach and bed alarm set.  Continue per current plan of care.      Function:  Eating Eating     Eating Assist Level: Set up assist for   Eating Set Up Assist For: Opening containers       Cognition Comprehension Comprehension assist level: Understands basic 75 - 89% of the time/ requires cueing 10 - 24% of the time  Expression   Expression assist level: Expresses basic 50 - 74% of the time/requires cueing 25 - 49% of the time. Needs to repeat parts of sentences.  Social Interaction Social Interaction assist level: Interacts appropriately 75 - 89% of the time - Needs redirection for appropriate language or to initiate interaction.  Problem Solving Problem solving assist level: Solves basic 25 - 49% of the time - needs direction more than half the time to initiate, plan or complete simple activities  Memory Memory assist level: Recognizes or recalls 25 - 49% of the time/requires cueing 50 - 75% of the time    Pain Pain Assessment Pain Assessment: Faces Faces Pain Scale: Hurts even more Pain Type: Acute pain Pain Location: Buttocks Pain Orientation: Left Pain Descriptors / Indicators: Discomfort Pain Intervention(s): RN made aware  Therapy/Group: Individual Therapy  Maloni Musleh, [name] 07/24/2015, 1:09 PM

## 2015-07-25 ENCOUNTER — Inpatient Hospital Stay (HOSPITAL_COMMUNITY): Payer: Medicaid Other | Admitting: Occupational Therapy

## 2015-07-25 ENCOUNTER — Inpatient Hospital Stay (HOSPITAL_COMMUNITY): Payer: Self-pay | Admitting: Physical Therapy

## 2015-07-25 ENCOUNTER — Inpatient Hospital Stay (HOSPITAL_COMMUNITY): Payer: Medicaid Other | Admitting: Speech Pathology

## 2015-07-25 LAB — CBC WITH DIFFERENTIAL/PLATELET
Basophils Absolute: 0.1 10*3/uL (ref 0.0–0.1)
Basophils Relative: 1 %
Eosinophils Absolute: 0.3 10*3/uL (ref 0.0–0.7)
Eosinophils Relative: 3 %
HCT: 40.5 % (ref 39.0–52.0)
Hemoglobin: 13.3 g/dL (ref 13.0–17.0)
Lymphocytes Relative: 24 %
Lymphs Abs: 2.6 10*3/uL (ref 0.7–4.0)
MCH: 31.6 pg (ref 26.0–34.0)
MCHC: 32.8 g/dL (ref 30.0–36.0)
MCV: 96.2 fL (ref 78.0–100.0)
Monocytes Absolute: 0.8 10*3/uL (ref 0.1–1.0)
Monocytes Relative: 7 %
Neutro Abs: 7.1 10*3/uL (ref 1.7–7.7)
Neutrophils Relative %: 66 %
Platelets: 436 10*3/uL — ABNORMAL HIGH (ref 150–400)
RBC: 4.21 MIL/uL — ABNORMAL LOW (ref 4.22–5.81)
RDW: 12.6 % (ref 11.5–15.5)
WBC: 10.8 10*3/uL — ABNORMAL HIGH (ref 4.0–10.5)

## 2015-07-25 MED ORDER — SODIUM CHLORIDE 0.9% FLUSH: 10.0000 mL | INTRAVENOUS | Status: DC | PRN

## 2015-07-25 MED ORDER — TIZANIDINE HCL 4 MG PO TABS
6.0000 mg | ORAL_TABLET | Freq: Three times a day (TID) | ORAL | Status: DC
  Administered 2015-07-25 – 2015-08-04 (×44): 6 mg via ORAL
  Filled 2015-07-25 (×42): qty 1

## 2015-07-25 NOTE — Progress Notes (Signed)
Physical Therapy Note  Patient Details  Name: AKWASI KUPERUS MRN: [id number] Date of Birth: 1960/06/28 Today's Date: 07/25/2015    Time 1: 1130-1200 30 minutes  1:1 No c/o pain.  Sit to stands with focus on decreased pushing and finding midline, initially total A, progressed to max A with repetition. Standing reaching with focus on initiation of R quad activation, improved reaching today, able to come to midline.  Bed <> w/c transfer with max A with manual facilitaiton for forward wt shift and pivot to chair.  Pt positioned in chair for lunch.  Time 2: 1300-1327 27 minutes  1:1 No c/o pain.  W/c mobility 150' x 2 with supervision in controlled environment. Gait at railing in hallway with max A for wt shifts and R LE advancement with blue rocker AFO. Pt with more even cadence and step length today.  Blocked practice of bed <> chair transfers with facilitation for forward wt shift and pivoting, needs max A to prevent pushing, motor planning improved with repetition.   Cay Kath 07/25/2015, 1:28 PM

## 2015-07-25 NOTE — Progress Notes (Signed)
Subjective/Complaints: Buttocks pain improved post I and D remains afeb ROS- limited by cognition and speech Objective: Vital Signs: Blood pressure 130/71, pulse 85, temperature 98.1 F (36.7 C), temperature source Oral, resp. rate 18, height 6' (1.829 m), weight 78.926 kg (174 lb), SpO2 97 %. No results found. Results for orders placed or performed during the hospital encounter of 07/08/15 (from the past 72 hour(s))  Culture, routine-abscess     Status: None (Preliminary result)   Collection Time: 07/24/15 11:10 AM  Result Value Ref Range   Specimen Description ABSCESS LEFT BUTTOCKS    Special Requests NONE    Gram Stain      ABUNDANT WBC PRESENT,BOTH PMN AND MONONUCLEAR NO SQUAMOUS EPITHELIAL CELLS SEEN ABUNDANT GRAM POSITIVE COCCI IN PAIRS IN CLUSTERS Performed at [facility]    Culture PENDING    Report Status PENDING   CBC with Differential/Platelet     Status: Abnormal   Collection Time: 07/24/15  4:03 PM  Result Value Ref Range   WBC 16.7 (H) 4.0 - 10.5 K/uL   RBC 4.08 (L) 4.22 - 5.81 MIL/uL   Hemoglobin 12.5 (L) 13.0 - 17.0 g/dL   HCT 38.8 (L) 39.0 - 52.0 %   MCV 95.1 78.0 - 100.0 fL   MCH 30.6 26.0 - 34.0 pg   MCHC 32.2 30.0 - 36.0 g/dL   RDW 12.5 11.5 - 15.5 %   Platelets 381 150 - 400 K/uL   Neutrophils Relative % 73 %   Neutro Abs 12.3 (H) 1.7 - 7.7 K/uL   Lymphocytes Relative 15 %   Lymphs Abs 2.4 0.7 - 4.0 K/uL   Monocytes Relative 11 %   Monocytes Absolute 1.8 (H) 0.1 - 1.0 K/uL   Eosinophils Relative 1 %   Eosinophils Absolute 0.2 0.0 - 0.7 K/uL   Basophils Relative 0 %   Basophils Absolute 0.0 0.0 - 0.1 K/uL     HEENT: normal and poor dentition Cardio: RRR and no murmur Resp: RRR and no murmur GI: BS positive and NT, ND Extremity:  Pulses positive and No Edema Skin:  Pustule R Lateral thigh, open in center with small amt yellow-brown drainage Neuro: oriented to person and GBO not [facility] or time, Flat, Abnormal Sensory absent LT in RUE  and RLE, intact on left side, Abnormal Motor 2- R arm ext, 4-/5 grip with persistent grasp, 3-/5 finger and wrist ext, trace R hip ext, Dysarthric and Other naming intact but decreased fluency, no flexor withdrawal, MAS 2 R hand , pesistent grasp Musc/Skel:  Other no pain with UE or LE ROM Gen NAD   Assessment/Plan: 1. Functional deficits secondary to Left ACA infarct with Right HP which require 3+ hours per day of interdisciplinary therapy in a comprehensive inpatient rehab setting. Physiatrist is providing close team supervision and 24 hour management of active medical problems listed below. Physiatrist and rehab team continue to assess barriers to discharge/monitor patient progress toward functional and medical goals. FIM: Function - Bathing Position: Wheelchair/chair at sink Body parts bathed by patient: Chest, Right arm, Abdomen, Right upper leg, Left upper leg Body parts bathed by helper: Front perineal area, Left arm, Right lower leg, Left lower leg, Back, Buttocks Assist Level: Touching or steadying assistance(Pt > 75%)  Function- Upper Body Dressing/Undressing What is the patient wearing?: Pull over shirt/dress Pull over shirt/dress - Perfomed by patient: Thread/unthread left sleeve, Put head through opening Pull over shirt/dress - Perfomed by helper: Pull shirt over trunk, Thread/unthread right sleeve Assist  Level: Touching or steadying assistance(Pt > 75%) Function - Lower Body Dressing/Undressing What is the patient wearing?: Socks, Shoes, Pants, [employer], AFO Position: Wheelchair/chair at [employer] - Performed by patient: Thread/unthread left underwear leg Underwear - Performed by helper: Thread/unthread right underwear leg, Pull underwear up/down Pants- Performed by patient: Thread/unthread left pants leg Pants- Performed by helper: Thread/unthread right pants leg, Pull pants up/down Non-skid slipper socks- Performed by helper: Don/doff right sock, Don/doff left  sock Socks - Performed by patient: Don/doff right sock, Don/doff left sock Socks - Performed by helper: Don/doff right sock, Don/doff left sock Shoes - Performed by patient: Don/doff left shoe, Fasten left Shoes - Performed by helper: Fasten right, Don/doff right shoe AFO - Performed by helper: Don/doff right AFO TED Hose - Performed by patient: Don/doff right TED hose, Don/doff left TED hose TED Hose - Performed by helper: Don/doff right TED hose, Don/doff left TED hose Assist for footwear: Partial/moderate assist Assist for lower body dressing: Touching or steadying assistance (Pt > 75%)  Function - Toileting Toileting activity did not occur: No continent bowel/bladder event Toileting steps completed by patient: Adjust clothing prior to toileting Toileting steps completed by helper: Adjust clothing prior to toileting, Performs perineal hygiene, Adjust clothing after toileting Toileting Assistive Devices: Grab bar or rail Assist level: Two helpers  Function - Air cabin crew transfer activity did not occur: Safety/medical concerns Toilet transfer assistive device: Elevated toilet seat/BSC over toilet Assist level to toilet: Moderate assist (Pt 50 - 74%/lift or lower) Assist level from toilet: Maximal assist (Pt 25 - 49%/lift and lower) Assist level to bedside commode (at bedside): 2 helpers Assist level from bedside commode (at bedside): 2 helpers  Function - Chair/bed transfer Chair/bed transfer method: Other Chair/bed transfer assist level: 2 helpers Chair/bed transfer assistive device: Mechanical lift Mechanical lift: Stedy (LUE supinated to decrease pushing) Chair/bed transfer details: Verbal cues for technique, Verbal cues for sequencing, Manual facilitation for weight shifting, Tactile cues for weight shifting   Function - Locomotion: Wheelchair Will patient use wheelchair at discharge?: Yes Type: Manual Max wheelchair distance: 100 Assist Level: Supervision or  verbal cues Wheel 50 feet with 2 turns activity did not occur: Safety/medical concerns (fatigue) Assist Level: Supervision or verbal cues Wheel 150 feet activity did not occur: Safety/medical concerns (fatigue) Assist Level: Supervision or verbal cues Turns around,maneuvers to table,bed, and toilet,negotiates 3% grade,maneuvers on rugs and over doorsills: No Function - Locomotion: Ambulation Ambulation activity did not occur: Safety/medical concerns (lack of sensation) Assistive device: Rail in hallway, Orthosis Max distance: 15 Assist level: Total assist (Pt < 25%) Walk 10 feet activity did not occur: Safety/medical concerns Assist level: Total assist (Pt < 25%) Walk 50 feet with 2 turns activity did not occur: Safety/medical concerns Walk 150 feet activity did not occur: Safety/medical concerns Walk 10 feet on uneven surfaces activity did not occur: Safety/medical concerns  Function - Comprehension Comprehension: Auditory Comprehension assist level: Understands basic 75 - 89% of the time/ requires cueing 10 - 24% of the time  Function - Expression Expression: Verbal Expression assist level: Expresses basic 50 - 74% of the time/requires cueing 25 - 49% of the time. Needs to repeat parts of sentences.  Function - Social Interaction Social Interaction assist level: Interacts appropriately 75 - 89% of the time - Needs redirection for appropriate language or to initiate interaction.  Function - Problem Solving Problem solving assist level: Solves basic 25 - 49% of the time - needs direction more than half the  time to initiate, plan or complete simple activities  Function - Memory Memory assist level: Recognizes or recalls 25 - 49% of the time/requires cueing 50 - 75% of the time Patient normally able to recall (first 3 days only): Current season  Medical Problem List and Plan: 1.  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  DVT Prophylaxis/Anticoagulation: Subcutaneous Lovenox. Monitor platelet counts and any signs of bleeding, plt 302K 3. Pain Management: Tylenol as needed. Pain controlled at present 4. Hypertension. Norvasc 5 mg daily, lisinopril 20 mg daily. 130/71 Monitor with increased mobility 5. Neuropsych: This patient is capable of making decisions on his own behalf. 6. Skin/Wound Care: Routine skin checks. OOB. Encourage appropriate nutrition 7. Fluids/Electrolytes/Nutrition: Routine I&O's with follow-up chemistries in AM 8. Hyperlipidemia. Lipitor 9. Tobacco/ cocaine/alcohol abuse. Provide counseling during rehab stay- cognitive deficits will limit effectiveness of co 10.  Hypoalbuminemia- mild will supplement po 11. Spasticity R UE and LE still increased on Zanaflex,increase to 6mg  QID  monitor BP,add resting hand splint 12.  Right thigh  as well as Left glut abscess, s/p I and D per Gen Surg,hydrotherapy to L glut  elevated WBC but afeb will repeat, no IV abx unless pt febrile or with increasing painLOS (Days) 17 A FACE TO FACE EVALUATION WAS PERFORMED  Khloie Hamada E 07/25/2015, 7:49 AM

## 2015-07-25 NOTE — Progress Notes (Signed)
Speech Language Pathology Daily Session Note  Patient Details  Name: Gerald [name] MRN: [id number] Date of Birth: 01/29/1961  Today's Date: 07/25/2015 SLP Individual Time: 1000-1100 SLP Individual Time Calculation (min): 60 min  Short Term Goals: Week 3: SLP Short Term Goal 1 (Week 3): Pt will follow 3-step directives with 75% accuracy and min A verbal cues.  SLP Short Term Goal 2 (Week 3): Pt will communicate basic needs/wants via phrase level verbal expression with mod  A. SLP Short Term Goal 3 (Week 3): Patient will self-monitor and correct verbal errors within structured tasks with Mod A verbal and visual cues.  SLP Short Term Goal 4 (Week 3): Patient will utilize call bell to request assistance in 25% of opportunities with Max A verbal and question cues.  SLP Short Term Goal 5 (Week 3): Patient will demonstrate selective attention to a funcitonal task in a mildly distracting enviornment for 30 minutes with Min A verbal cues for redirection.  SLP Short Term Goal 6 (Week 3): Patient will recall/carryover new information in regards to technique for transfers, ADL's etc, with Mod A question and verbal cues.   Skilled Therapeutic Interventions:  Skilled treatment session focused on functional communication. SLP facilitated session by providing supervision verbal cues for naming functional items and Mod A question and verbal cues for patient to identify a similarity/difference between 2 objects at the phrase and sentence level.  On average, patient's length of utterance throughout task was ~5 words, however, his greatest sentence was up to 10 words! Patient also participated in a verbal description task with overall Mod A question cues in regards to providing a verbal description of a specific word that the clinician had to guess.  Patient was able to name the item the clinician was verbally describing with 100% accuracy. Patient demonstrated selective attention to task in a mildly distracting  environment for 30 minutes with supervision verbal cues for redirection. Patient left upright in wheelchair with quick release belt in place and all needs within reach. Continue with current plan of care.     Function:  Cognition Comprehension Comprehension assist level: Understands basic 75 - 89% of the time/ requires cueing 10 - 24% of the time  Expression   Expression assist level: Expresses basic 50 - 74% of the time/requires cueing 25 - 49% of the time. Needs to repeat parts of sentences.  Social Interaction Social Interaction assist level: Interacts appropriately 75 - 89% of the time - Needs redirection for appropriate language or to initiate interaction.  Problem Solving Problem solving assist level: Solves basic 25 - 49% of the time - needs direction more than half the time to initiate, plan or complete simple activities  Memory Memory assist level: Recognizes or recalls 25 - 49% of the time/requires cueing 50 - 75% of the time    Pain Pain Assessment Pain Assessment: No/denies pain  Therapy/Group: Individual Therapy  Ed Mandich 07/25/2015, 11:32 AM

## 2015-07-25 NOTE — Progress Notes (Signed)
Occupational Therapy Session Note  Patient Details  Name: Gerald [name] MRN: [id number] Date of Birth: 10/22/1960  Today's Date: 07/25/2015 OT Individual Time: [id number] Individual Time Calculation (min): 89 min    Short Term Goals: Week 3:  OT Short Term Goal 1 (Week 3): Pt will maintain dynamic sitting balance during bathing tasks with mod assist. OT Short Term Goal 2 (Week 3): Pt will complete UB dressing in supported sitting with supervision. OT Short Term Goal 3 (Week 3): Pt will complete LB dressing with mod assist sit to stand. OT Short Term Goal 4 (Week 3): Pt will use the RUE as a stabilizer with mod assist during grooming and bathing tasks.   OT Short Term Goal 5 (Week 3): Pt will perform toilet transfer to the [facility] with mod assist stand pivot.  Skilled Therapeutic Interventions/Progress Updates:    Pt completed bathing and dressing sitting at the sink to begin session.  Did not attempt shower secondary to having a sacral abscess and thigh abscess.  Overall mod assist for bathing sit to stand with mod questioning cueing for hemi dressing techniques.  He was able to cross the RLE over the left knee but needed max assist to maintain while threading brief and pants.  Once he completed dressing had pt transfer back to the EOB for work on breakfast while maintaining sitting unsupported.  Pt still with LOB to the right and posteriorly with most ADL tasks. Applied NMES to right triceps and forearm while he worked on breakfast.  Settings at 35 PPS pulse width of 300 and intensity at forearm at level 4 and at triceps on level 5.  Pt tolerated 15 mins without any adverse reactions.  Pt transferred back to the wheelchair with max assist squat pivot to conclude session.  Pt left with safety belt in place and call button in lap.    Therapy Documentation Precautions:  Precautions Precautions: Fall Precaution Comments: right hemiparesis with increased flexor tone Restrictions Weight Bearing  Restrictions: No  Pain: Pain Assessment Pain Assessment: No/denies pain ADL: See Function Navigator for Current Functional Status.   Therapy/Group: Individual Therapy  [name] OTR/L 07/25/2015, 12:18 PM

## 2015-07-25 NOTE — Plan of Care (Signed)
Problem: RH SKIN INTEGRITY Goal: RH STG SKIN FREE OF INFECTION/BREAKDOWN Skin free of infection/breakdown with total assistance  Outcome: Not Progressing Abscess to Right thigh and Left buttocks, I&D done bedside with surgical consult; wet-dry dressing.

## 2015-07-25 NOTE — Progress Notes (Signed)
Patient ID: Gerald [name], male   DOB: January 23, 1961, 55 y.o.   MRN: [id number]     [facility]      [address] [place], [place] [id number]    Phone: [phone] FAX: [phone]     Subjective: Afebrile.   Objective:  Vital signs:  Filed Vitals:   07/24/15 0622 07/24/15 0855 07/24/15 1454 07/25/15 0508  BP: 139/83 136/86 122/79 130/71  Pulse: 102  90 85  Temp: 98.4 F (36.9 C)  97.5 F (36.4 C) 98.1 F (36.7 C)  TempSrc: Oral  Oral Oral  Resp: 18  16 18   Height:      Weight: 78.926 kg (174 lb)     SpO2: 97%  99% 97%    Last BM Date: 07/24/15  Intake/Output   Yesterday:  02/09 0701 - 02/10 0700 In: 1020 [P.O.:1020] Out: 200 [Urine:200] This shift:    Physical Exam: General: Pt awake/alert/oriented an in no acute distress Skin: right thigh-wound is  Clean, serosanguinous drainage, packed.  Left buttock-yellow tissue at the base, no further areas of fluctuance, but induration noted, erythema has resolved.    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 [facility]) Active Problems:   Essential hypertension   Alcohol abuse   Right hemiparesis (HCC)   Acute left ACA ischemic stroke (HCC)   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    Results:   Labs: Results for orders placed or performed during the hospital encounter of 07/08/15 (from the past 48 hour(s))  Culture, routine-abscess     Status: None (Preliminary result)   Collection Time: 07/24/15 11:10 AM  Result Value Ref Range   Specimen Description ABSCESS LEFT BUTTOCKS    Special Requests NONE    Gram Stain      ABUNDANT WBC PRESENT,BOTH PMN AND MONONUCLEAR NO SQUAMOUS EPITHELIAL CELLS SEEN ABUNDANT GRAM POSITIVE COCCI IN PAIRS IN CLUSTERS Performed at [facility]    Culture PENDING    Report Status PENDING   CBC with Differential/Platelet     Status: Abnormal   Collection Time:  07/24/15  4:03 PM  Result Value Ref Range   WBC 16.7 (H) 4.0 - 10.5 K/uL   RBC 4.08 (L) 4.22 - 5.81 MIL/uL   Hemoglobin 12.5 (L) 13.0 - 17.0 g/dL   HCT 38.8 (L) 39.0 - 52.0 %   MCV 95.1 78.0 - 100.0 fL   MCH 30.6 26.0 - 34.0 pg   MCHC 32.2 30.0 - 36.0 g/dL   RDW 12.5 11.5 - 15.5 %   Platelets 381 150 - 400 K/uL   Neutrophils Relative % 73 %   Neutro Abs 12.3 (H) 1.7 - 7.7 K/uL   Lymphocytes Relative 15 %   Lymphs Abs 2.4 0.7 - 4.0 K/uL   Monocytes Relative 11 %   Monocytes Absolute 1.8 (H) 0.1 - 1.0 K/uL   Eosinophils Relative 1 %   Eosinophils Absolute 0.2 0.0 - 0.7 K/uL   Basophils Relative 0 %   Basophils Absolute 0.0 0.0 - 0.1 K/uL    Imaging / Studies: No results found.  Medications / Allergies:  Scheduled Meds: . amLODipine  10 mg Oral Daily  . aspirin  325 mg Oral Daily  . atorvastatin  20 mg Oral q1800  . enoxaparin (LOVENOX) injection  40 mg Subcutaneous Q24H  . folic acid  1 mg Oral Daily  . lisinopril  40 mg Oral Daily  .  methylphenidate  5 mg Oral BID WC  . multivitamin with minerals  1 tablet Oral Daily  . thiamine  100 mg Oral Daily  . tiZANidine  4 mg Oral TID PC & HS   Continuous Infusions:  PRN Meds:.acetaminophen, cyclobenzaprine, HYDROcodone-acetaminophen, ondansetron **OR** ondansetron (ZOFRAN) IV, simethicone, sorbitol  Antibiotics: Anti-infectives    Start     Dose/Rate Route Frequency Ordered Stop   07/23/15 0800  doxycycline (VIBRA-TABS) tablet 100 mg  Status:  Discontinued     100 mg Oral Every 12 hours 07/23/15 0748 07/24/15 1525        Assessment/Plan POD#1 I&D left buttock and right thigh abscess -PT hydrotherapy to help clean up the buttock wound -BID wet to dry dressing changes to both -antibiotics, follow cultures  Erby Pian, ANP-BC [facility] Pager [phone](7A-4:30P) For consults and floor pages call [phone](7A-4:30P)  07/25/2015 7:32 AM

## 2015-07-25 NOTE — Progress Notes (Signed)
Both wound dressings changed during assessment wet-dry.  Left gluteal soiled with stool as well as soaked with drainage.  Right lateral thigh soaked with drainage.  Will continue to monitor wound sites.

## 2015-07-26 ENCOUNTER — Ambulatory Visit (HOSPITAL_COMMUNITY): Payer: Self-pay

## 2015-07-26 ENCOUNTER — Inpatient Hospital Stay (HOSPITAL_COMMUNITY): Payer: Self-pay

## 2015-07-26 NOTE — Progress Notes (Signed)
Subjective/Complaints: Patient seen this morning lying comfortably in bed. He indicates he slept well overnight.  ROS- limited by cognition and speech, but appears to deny CP, SOB, N/V/D  Objective: Vital Signs: Blood pressure 116/70, pulse 85, temperature 98.1 F (36.7 C), temperature source Oral, resp. rate 18, height 6' (1.829 m), weight 78.926 kg (174 lb), SpO2 99 %. No results found. Results for orders placed or performed during the hospital encounter of 07/08/15 (from the past 72 hour(s))  Culture, routine-abscess     Status: None (Preliminary result)   Collection Time: 07/24/15 11:10 AM  Result Value Ref Range   Specimen Description ABSCESS LEFT BUTTOCKS    Special Requests NONE    Gram Stain      ABUNDANT WBC PRESENT,BOTH PMN AND MONONUCLEAR NO SQUAMOUS EPITHELIAL CELLS SEEN ABUNDANT GRAM POSITIVE COCCI IN PAIRS IN CLUSTERS Performed at [facility]    Culture      ABUNDANT STAPHYLOCOCCUS AUREUS Note: RIFAMPIN AND GENTAMICIN SHOULD NOT BE USED AS SINGLE DRUGS FOR TREATMENT OF STAPH INFECTIONS. Performed at [facility]    Report Status PENDING   CBC with Differential/Platelet     Status: Abnormal   Collection Time: 07/24/15  4:03 PM  Result Value Ref Range   WBC 16.7 (H) 4.0 - 10.5 K/uL   RBC 4.08 (L) 4.22 - 5.81 MIL/uL   Hemoglobin 12.5 (L) 13.0 - 17.0 g/dL   HCT 38.8 (L) 39.0 - 52.0 %   MCV 95.1 78.0 - 100.0 fL   MCH 30.6 26.0 - 34.0 pg   MCHC 32.2 30.0 - 36.0 g/dL   RDW 12.5 11.5 - 15.5 %   Platelets 381 150 - 400 K/uL   Neutrophils Relative % 73 %   Neutro Abs 12.3 (H) 1.7 - 7.7 K/uL   Lymphocytes Relative 15 %   Lymphs Abs 2.4 0.7 - 4.0 K/uL   Monocytes Relative 11 %   Monocytes Absolute 1.8 (H) 0.1 - 1.0 K/uL   Eosinophils Relative 1 %   Eosinophils Absolute 0.2 0.0 - 0.7 K/uL   Basophils Relative 0 %   Basophils Absolute 0.0 0.0 - 0.1 K/uL  CBC with Differential/Platelet     Status: Abnormal   Collection Time: 07/25/15  9:14 AM   Result Value Ref Range   WBC 10.8 (H) 4.0 - 10.5 K/uL   RBC 4.21 (L) 4.22 - 5.81 MIL/uL   Hemoglobin 13.3 13.0 - 17.0 g/dL   HCT 40.5 39.0 - 52.0 %   MCV 96.2 78.0 - 100.0 fL   MCH 31.6 26.0 - 34.0 pg   MCHC 32.8 30.0 - 36.0 g/dL   RDW 12.6 11.5 - 15.5 %   Platelets 436 (H) 150 - 400 K/uL   Neutrophils Relative % 66 %   Neutro Abs 7.1 1.7 - 7.7 K/uL   Lymphocytes Relative 24 %   Lymphs Abs 2.6 0.7 - 4.0 K/uL   Monocytes Relative 7 %   Monocytes Absolute 0.8 0.1 - 1.0 K/uL   Eosinophils Relative 3 %   Eosinophils Absolute 0.3 0.0 - 0.7 K/uL   Basophils Relative 1 %   Basophils Absolute 0.1 0.0 - 0.1 K/uL     HEENT: Normocephalic, atraumatic. poor dentition.  Cardio: RRR and no murmur Resp: Unlabored breathing, bilaterally clear to auscultation. GI: BS positive and NT, ND Skin:  No new lesions on visible skin. Warm and dry. Neuro: Alert.  Motor: RUE: 2-/5 elbow extension, 3/5 grip  RLE: 0/5 proximal distal  Expressive  aphasia Dysarthric speech Sensation diminished to light touch R UE/RLE Musc/Skel:  No tenderness. No edema. Gen: NAD vital signs reviewed. Psych: Flat affect. normal behavior.  Assessment/Plan: 1. Functional deficits secondary to Left ACA infarct with Right HP which require 3+ hours per day of interdisciplinary therapy in a comprehensive inpatient rehab setting. Physiatrist is providing close team supervision and 24 hour management of active medical problems listed below. Physiatrist and rehab team continue to assess barriers to discharge/monitor patient progress toward functional and medical goals. FIM: Function - Bathing Position: Wheelchair/chair at sink Body parts bathed by patient: Chest, Right arm, Abdomen, Right upper leg, Left upper leg, Right lower leg, Left lower leg Body parts bathed by helper: Back, Buttocks, Front perineal area, Left arm Assist Level: Touching or steadying assistance(Pt > 75%)  Function- Upper Body Dressing/Undressing What  is the patient wearing?: Pull over shirt/dress Pull over shirt/dress - Perfomed by patient: Thread/unthread right sleeve, Thread/unthread left sleeve, Put head through opening, Pull shirt over trunk Pull over shirt/dress - Perfomed by helper: Pull shirt over trunk, Thread/unthread right sleeve Assist Level: Supervision or verbal cues Function - Lower Body Dressing/Undressing What is the patient wearing?: Shoes, AFO, Pants Position: Wheelchair/chair at sink Underwear - Performed by patient: Thread/unthread left underwear leg Underwear - Performed by helper: Thread/unthread right underwear leg, Pull underwear up/down Pants- Performed by patient: Thread/unthread left pants leg Pants- Performed by helper: Thread/unthread right pants leg, Thread/unthread left pants leg, Pull pants up/down, Fasten/unfasten pants Non-skid slipper socks- Performed by helper: Don/doff right sock, Don/doff left sock Socks - Performed by patient: Don/doff right sock, Don/doff left sock Socks - Performed by helper: Don/doff right sock, Don/doff left sock Shoes - Performed by patient: Don/doff left shoe, Fasten left Shoes - Performed by helper: Fasten right, Don/doff right shoe AFO - Performed by helper: Don/doff right AFO TED Hose - Performed by patient: Don/doff right TED hose, Don/doff left TED hose TED Hose - Performed by helper: Don/doff right TED hose, Don/doff left TED hose Assist for footwear: Partial/moderate assist Assist for lower body dressing: Touching or steadying assistance (Pt > 75%)  Function - Toileting Toileting activity did not occur: No continent bowel/bladder event Toileting steps completed by patient: Adjust clothing prior to toileting Toileting steps completed by helper: Adjust clothing prior to toileting, Performs perineal hygiene, Adjust clothing after toileting Toileting Assistive Devices: Grab bar or rail Assist level: Two helpers  Function - Air cabin crew transfer activity did  not occur: Safety/medical concerns Toilet transfer assistive device: Elevated toilet seat/BSC over toilet Assist level to toilet: Moderate assist (Pt 50 - 74%/lift or lower) Assist level from toilet: Maximal assist (Pt 25 - 49%/lift and lower) Assist level to bedside commode (at bedside): 2 helpers Assist level from bedside commode (at bedside): 2 helpers  Function - Chair/bed transfer Chair/bed transfer method: Other Chair/bed transfer assist level: 2 helpers Chair/bed transfer assistive device: Mechanical lift Mechanical lift: Stedy (LUE supinated to decrease pushing) Chair/bed transfer details: Verbal cues for technique, Verbal cues for sequencing, Manual facilitation for weight shifting, Tactile cues for weight shifting   Function - Locomotion: Wheelchair Will patient use wheelchair at discharge?: Yes Type: Manual Max wheelchair distance: 100 Assist Level: Supervision or verbal cues Wheel 50 feet with 2 turns activity did not occur: Safety/medical concerns (fatigue) Assist Level: Supervision or verbal cues Wheel 150 feet activity did not occur: Safety/medical concerns (fatigue) Assist Level: Supervision or verbal cues Turns around,maneuvers to table,bed, and toilet,negotiates 3% grade,maneuvers on rugs and over doorsills:  No Function - Locomotion: Ambulation Ambulation activity did not occur: Safety/medical concerns (lack of sensation) Assistive device: Rail in hallway, Orthosis Max distance: 15 Assist level: Total assist (Pt < 25%) Walk 10 feet activity did not occur: Safety/medical concerns Assist level: Total assist (Pt < 25%) Walk 50 feet with 2 turns activity did not occur: Safety/medical concerns Walk 150 feet activity did not occur: Safety/medical concerns Walk 10 feet on uneven surfaces activity did not occur: Safety/medical concerns  Function - Comprehension Comprehension: Auditory Comprehension assist level: Understands basic 75 - 89% of the time/ requires cueing 10  - 24% of the time  Function - Expression Expression: Verbal Expression assist level: Expresses basic 50 - 74% of the time/requires cueing 25 - 49% of the time. Needs to repeat parts of sentences.  Function - Social Interaction Social Interaction assist level: Interacts appropriately 75 - 89% of the time - Needs redirection for appropriate language or to initiate interaction.  Function - Problem Solving Problem solving assist level: Solves basic 25 - 49% of the time - needs direction more than half the time to initiate, plan or complete simple activities  Function - Memory Memory assist level: Recognizes or recalls 25 - 49% of the time/requires cueing 50 - 75% of the time Patient normally able to recall (first 3 days only): Current season  Medical Problem List and Plan: 1.  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  DVT Prophylaxis/Anticoagulation: Subcutaneous Lovenox. Monitor platelet counts and any signs of bleeding, plt 436 on 2/10 3. Pain Management: Tylenol as needed. Pain controlled at present 4. Hypertension. Norvasc 5 mg daily, lisinopril 20 mg daily. 116/70 this AM Monitor with increased mobility 5. Neuropsych: This patient is capable of making decisions on his own behalf. 6. Skin/Wound Care: Routine skin checks. OOB. Encourage appropriate nutrition 7. Fluids/Electrolytes/Nutrition: Routine I&O's  8. Hyperlipidemia. Lipitor 9. Tobacco/ cocaine/alcohol abuse. Provide counseling during rehab stay- cognitive deficits will limit effectiveness of compliance 10.  Hypoalbuminemia- mild  11. Spasticity R UE and LE still increased on Zanaflex,increased to 6mg  QID  monitor BP, added resting hand splint 12.  Right thigh  as well as Left glut abscess, s/p I and D per Gen Surg,hydrotherapy to L glut   no IV abx unless pt febrile or with  increasing pain (WBC trending down on 2/10)  LOS (Days) 18 A FACE TO FACE EVALUATION WAS PERFORMED  Chantry Headen Lorie Phenix 07/26/2015, 12:17 PM
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
## 2015-07-27 ENCOUNTER — Inpatient Hospital Stay (HOSPITAL_COMMUNITY): Payer: Self-pay | Admitting: Physical Therapy

## 2015-07-27 ENCOUNTER — Telehealth (HOSPITAL_BASED_OUTPATIENT_CLINIC_OR_DEPARTMENT_OTHER): Payer: Self-pay | Admitting: Emergency Medicine

## 2015-07-27 ENCOUNTER — Inpatient Hospital Stay (HOSPITAL_COMMUNITY): Payer: Self-pay

## 2015-07-27 DIAGNOSIS — A4902 Methicillin resistant Staphylococcus aureus infection, unspecified site: Secondary | ICD-10-CM | POA: Insufficient documentation

## 2015-07-27 LAB — CULTURE, ROUTINE-ABSCESS

## 2015-07-27 MED ORDER — SULFAMETHOXAZOLE-TRIMETHOPRIM 800-160 MG PO TABS
1.0000 | ORAL_TABLET | Freq: Two times a day (BID) | ORAL | Status: AC
  Administered 2015-07-27 – 2015-08-05 (×20): 1 via ORAL
  Filled 2015-07-27 (×21): qty 1

## 2015-07-27 NOTE — Plan of Care (Signed)
Problem: RH SKIN INTEGRITY Goal: RH STG SKIN FREE OF INFECTION/BREAKDOWN Skin free of infection/breakdown with total assistance  Outcome: Not Progressing MRSA in Buttock abscess

## 2015-07-27 NOTE — Progress Notes (Signed)
Lab notified that abscess on L. Buttock was MRSA positive. MD notified and will put in orders. Patient placed on Contact precautions.

## 2015-07-27 NOTE — Progress Notes (Addendum)
Subjective/Complaints: Patient resting comfortably this morning. He slept well overnight. He did not have any complaints at present.  ROS- limited by cognition and speech, but appears to deny CP, SOB, N/V/D  Objective: Vital Signs: Blood pressure 126/71, pulse 81, temperature 98.2 F (36.8 C), temperature source Oral, resp. rate 18, height 6' (1.829 m), weight 78.926 kg (174 lb), SpO2 99 %. No results found. Results for orders placed or performed during the hospital encounter of 07/08/15 (from the past 72 hour(s))  Culture, routine-abscess     Status: None (Preliminary result)   Collection Time: 07/24/15 11:10 AM  Result Value Ref Range   Specimen Description ABSCESS LEFT BUTTOCKS    Special Requests NONE    Gram Stain      ABUNDANT WBC PRESENT,BOTH PMN AND MONONUCLEAR NO SQUAMOUS EPITHELIAL CELLS SEEN ABUNDANT GRAM POSITIVE COCCI IN PAIRS IN CLUSTERS Performed at [facility]    Culture      ABUNDANT STAPHYLOCOCCUS AUREUS Note: RIFAMPIN AND GENTAMICIN SHOULD NOT BE USED AS SINGLE DRUGS FOR TREATMENT OF STAPH INFECTIONS. Performed at [facility]    Report Status PENDING   CBC with Differential/Platelet     Status: Abnormal   Collection Time: 07/24/15  4:03 PM  Result Value Ref Range   WBC 16.7 (H) 4.0 - 10.5 K/uL   RBC 4.08 (L) 4.22 - 5.81 MIL/uL   Hemoglobin 12.5 (L) 13.0 - 17.0 g/dL   HCT 38.8 (L) 39.0 - 52.0 %   MCV 95.1 78.0 - 100.0 fL   MCH 30.6 26.0 - 34.0 pg   MCHC 32.2 30.0 - 36.0 g/dL   RDW 12.5 11.5 - 15.5 %   Platelets 381 150 - 400 K/uL   Neutrophils Relative % 73 %   Neutro Abs 12.3 (H) 1.7 - 7.7 K/uL   Lymphocytes Relative 15 %   Lymphs Abs 2.4 0.7 - 4.0 K/uL   Monocytes Relative 11 %   Monocytes Absolute 1.8 (H) 0.1 - 1.0 K/uL   Eosinophils Relative 1 %   Eosinophils Absolute 0.2 0.0 - 0.7 K/uL   Basophils Relative 0 %   Basophils Absolute 0.0 0.0 - 0.1 K/uL  CBC with Differential/Platelet     Status: Abnormal   Collection Time:  07/25/15  9:14 AM  Result Value Ref Range   WBC 10.8 (H) 4.0 - 10.5 K/uL   RBC 4.21 (L) 4.22 - 5.81 MIL/uL   Hemoglobin 13.3 13.0 - 17.0 g/dL   HCT 40.5 39.0 - 52.0 %   MCV 96.2 78.0 - 100.0 fL   MCH 31.6 26.0 - 34.0 pg   MCHC 32.8 30.0 - 36.0 g/dL   RDW 12.6 11.5 - 15.5 %   Platelets 436 (H) 150 - 400 K/uL   Neutrophils Relative % 66 %   Neutro Abs 7.1 1.7 - 7.7 K/uL   Lymphocytes Relative 24 %   Lymphs Abs 2.6 0.7 - 4.0 K/uL   Monocytes Relative 7 %   Monocytes Absolute 0.8 0.1 - 1.0 K/uL   Eosinophils Relative 3 %   Eosinophils Absolute 0.3 0.0 - 0.7 K/uL   Basophils Relative 1 %   Basophils Absolute 0.1 0.0 - 0.1 K/uL     HEENT: Normocephalic, atraumatic. poor dentition.  Cardio: RRR and no murmur Resp: Unlabored breathing, bilaterally clear to auscultation. GI: BS positive and NT, ND Skin:  No new lesions on visible skin. Warm and dry. Neuro: Alert.  Motor: RUE: 2-/5 elbow extension, 3/5 grip  RLE: 0/5 proximal  distal  Expressive aphasia Dysarthric speech Musc/Skel:  No tenderness. No edema. Gen: NAD vital signs reviewed. Psych: Flat affect. normal behavior.  Assessment/Plan: 1. Functional deficits secondary to Left ACA infarct with Right HP which require 3+ hours per day of interdisciplinary therapy in a comprehensive inpatient rehab setting. Physiatrist is providing close team supervision and 24 hour management of active medical problems listed below. Physiatrist and rehab team continue to assess barriers to discharge/monitor patient progress toward functional and medical goals. FIM: Function - Bathing Position: Wheelchair/chair at sink Body parts bathed by patient: Chest, Right arm, Abdomen, Right upper leg, Left upper leg, Right lower leg, Left lower leg Body parts bathed by helper: Back, Buttocks, Front perineal area, Left arm Assist Level: Touching or steadying assistance(Pt > 75%)  Function- Upper Body Dressing/Undressing What is the patient wearing?: Pull  over shirt/dress Pull over shirt/dress - Perfomed by patient: Thread/unthread right sleeve, Thread/unthread left sleeve, Put head through opening, Pull shirt over trunk Pull over shirt/dress - Perfomed by helper: Pull shirt over trunk, Thread/unthread right sleeve Assist Level: Supervision or verbal cues Function - Lower Body Dressing/Undressing What is the patient wearing?: Shoes, AFO, Pants Position: Wheelchair/chair at sink Underwear - Performed by patient: Thread/unthread left underwear leg Underwear - Performed by helper: Thread/unthread right underwear leg, Pull underwear up/down Pants- Performed by patient: Thread/unthread left pants leg Pants- Performed by helper: Thread/unthread right pants leg, Thread/unthread left pants leg, Pull pants up/down, Fasten/unfasten pants Non-skid slipper socks- Performed by helper: Don/doff right sock, Don/doff left sock Socks - Performed by patient: Don/doff right sock, Don/doff left sock Socks - Performed by helper: Don/doff right sock, Don/doff left sock Shoes - Performed by patient: Don/doff left shoe, Fasten left Shoes - Performed by helper: Fasten right, Don/doff right shoe AFO - Performed by helper: Don/doff right AFO TED Hose - Performed by patient: Don/doff right TED hose, Don/doff left TED hose TED Hose - Performed by helper: Don/doff right TED hose, Don/doff left TED hose Assist for footwear: Partial/moderate assist Assist for lower body dressing: Touching or steadying assistance (Pt > 75%)  Function - Toileting Toileting activity did not occur: No continent bowel/bladder event Toileting steps completed by patient: Adjust clothing prior to toileting Toileting steps completed by helper: Adjust clothing prior to toileting, Performs perineal hygiene, Adjust clothing after toileting Toileting Assistive Devices: Grab bar or rail Assist level: Two helpers  Function - Air cabin crew transfer activity did not occur: Safety/medical  concerns Toilet transfer assistive device: Elevated toilet seat/BSC over toilet Assist level to toilet: Moderate assist (Pt 50 - 74%/lift or lower) Assist level from toilet: Maximal assist (Pt 25 - 49%/lift and lower) Assist level to bedside commode (at bedside): 2 helpers Assist level from bedside commode (at bedside): 2 helpers  Function - Chair/bed transfer Chair/bed transfer method: Other Chair/bed transfer assist level: 2 helpers Chair/bed transfer assistive device: Mechanical lift Mechanical lift: Stedy (LUE supinated to decrease pushing) Chair/bed transfer details: Verbal cues for technique, Verbal cues for sequencing, Manual facilitation for weight shifting, Tactile cues for weight shifting   Function - Locomotion: Wheelchair Will patient use wheelchair at discharge?: Yes Type: Manual Max wheelchair distance: 100 Assist Level: Supervision or verbal cues Wheel 50 feet with 2 turns activity did not occur: Safety/medical concerns (fatigue) Assist Level: Supervision or verbal cues Wheel 150 feet activity did not occur: Safety/medical concerns (fatigue) Assist Level: Supervision or verbal cues Turns around,maneuvers to table,bed, and toilet,negotiates 3% grade,maneuvers on rugs and over doorsills: No Function - Locomotion:  Ambulation Ambulation activity did not occur: Safety/medical concerns (lack of sensation) Assistive device: Rail in hallway, Orthosis Max distance: 15 Assist level: Total assist (Pt < 25%) Walk 10 feet activity did not occur: Safety/medical concerns Assist level: Total assist (Pt < 25%) Walk 50 feet with 2 turns activity did not occur: Safety/medical concerns Walk 150 feet activity did not occur: Safety/medical concerns Walk 10 feet on uneven surfaces activity did not occur: Safety/medical concerns  Function - Comprehension Comprehension: Auditory Comprehension assist level: Understands basic 75 - 89% of the time/ requires cueing 10 - 24% of the  time  Function - Expression Expression: Verbal Expression assist level: Expresses basic 50 - 74% of the time/requires cueing 25 - 49% of the time. Needs to repeat parts of sentences.  Function - Social Interaction Social Interaction assist level: Interacts appropriately 75 - 89% of the time - Needs redirection for appropriate language or to initiate interaction.  Function - Problem Solving Problem solving assist level: Solves basic 25 - 49% of the time - needs direction more than half the time to initiate, plan or complete simple activities  Function - Memory Memory assist level: Recognizes or recalls 25 - 49% of the time/requires cueing 50 - 75% of the time Patient normally able to recall (first 3 days only): Current season  Medical Problem List and Plan: 1.  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  DVT Prophylaxis/Anticoagulation: Subcutaneous Lovenox. Monitor platelet counts and any signs of bleeding, plt 436 on 2/10 3. Pain Management: Tylenol as needed. Pain controlled at present 4. Hypertension. Norvasc 5 mg daily, lisinopril 20 mg daily. 145/76 this AM Monitor with increased mobility 5. Neuropsych: This patient is capable of making decisions on his own behalf. 6. Skin/Wound Care: Routine skin checks. OOB. Encourage appropriate nutrition 7. Fluids/Electrolytes/Nutrition: Routine I&O's   Eating 100% of his meals 8. Hyperlipidemia. Lipitor 9. Tobacco/ cocaine/alcohol abuse. Provide counseling during rehab stay- cognitive deficits will limit effectiveness of compliance 10.  Hypoalbuminemia- mild  11. Spasticity R UE and LE still increased on Zanaflex,increased to 6mg  QID  monitor BP, added resting hand splint 12.  Right thigh  as well as Left glut abscess, s/p I and D per Gen Surg,hydrotherapy to L glut   no IV abx unless pt febrile  or with increasing pain (WBC trending down on 2/10)  Pt started on PO spetra on 2/12 for MRSA wound culture  LOS (Days) 19 A FACE TO FACE EVALUATION WAS PERFORMED  Anaiza Behrens Lorie Phenix 07/27/2015, 8:12 AM

## 2015-07-28 ENCOUNTER — Inpatient Hospital Stay (HOSPITAL_COMMUNITY): Payer: Medicaid Other

## 2015-07-28 ENCOUNTER — Inpatient Hospital Stay (HOSPITAL_COMMUNITY): Payer: Medicaid Other | Admitting: Speech Pathology

## 2015-07-28 ENCOUNTER — Ambulatory Visit (HOSPITAL_COMMUNITY): Payer: Self-pay

## 2015-07-28 ENCOUNTER — Inpatient Hospital Stay (HOSPITAL_COMMUNITY): Payer: Self-pay | Admitting: Physical Therapy

## 2015-07-28 NOTE — Progress Notes (Signed)
Social Work Patient ID: Gerald [name], male   DOB: December 04, 1960, 55 y.o.   MRN: [id number] Spoke with [name]-sister to set up time for her and [name]-pt's girlfriend to set up family education, decided Thursday @ 9:00. Will see if they will be able to provide the care pt will require at discharge from rehab.

## 2015-07-28 NOTE — Progress Notes (Signed)
Subjective/Complaints: Patient resting comfortably this morning. He slept well overnight. He did not have any complaints at present.  ROS- limited by cognition and speech, but appears to deny CP, SOB, N/V/D  Objective: Vital Signs: Blood pressure 118/76, pulse 93, temperature 97.9 F (36.6 C), temperature source Oral, resp. rate 17, height 6' (1.829 m), weight 78.926 kg (174 lb), SpO2 95 %. No results found. Results for orders placed or performed during the hospital encounter of 07/08/15 (from the past 72 hour(s))  CBC with Differential/Platelet     Status: Abnormal   Collection Time: 07/25/15  9:14 AM  Result Value Ref Range   WBC 10.8 (H) 4.0 - 10.5 K/uL   RBC 4.21 (L) 4.22 - 5.81 MIL/uL   Hemoglobin 13.3 13.0 - 17.0 g/dL   HCT 40.5 39.0 - 52.0 %   MCV 96.2 78.0 - 100.0 fL   MCH 31.6 26.0 - 34.0 pg   MCHC 32.8 30.0 - 36.0 g/dL   RDW 12.6 11.5 - 15.5 %   Platelets 436 (H) 150 - 400 K/uL   Neutrophils Relative % 66 %   Neutro Abs 7.1 1.7 - 7.7 K/uL   Lymphocytes Relative 24 %   Lymphs Abs 2.6 0.7 - 4.0 K/uL   Monocytes Relative 7 %   Monocytes Absolute 0.8 0.1 - 1.0 K/uL   Eosinophils Relative 3 %   Eosinophils Absolute 0.3 0.0 - 0.7 K/uL   Basophils Relative 1 %   Basophils Absolute 0.1 0.0 - 0.1 K/uL     HEENT: Normocephalic, atraumatic. poor dentition.  Cardio: RRR and no murmur Resp: Unlabored breathing, bilaterally clear to auscultation. GI: BS positive and NT, ND Skin:  No new lesions on visible skin. Warm and dry. Neuro: Alert.  Motor: RUE: 2-/5 elbow extension, 3/5 grip  RLE: 0/5 proximal distal  Expressive aphasia Dysarthric speech Musc/Skel:  No tenderness. No edema. Gen: NAD vital signs reviewed. Psych: Flat affect. normal behavior.  Assessment/Plan: 1. Functional deficits secondary to Left ACA infarct with Right HP which require 3+ hours per day of interdisciplinary therapy in a comprehensive inpatient rehab setting. Physiatrist is providing close  team supervision and 24 hour management of active medical problems listed below. Physiatrist and rehab team continue to assess barriers to discharge/monitor patient progress toward functional and medical goals. FIM: Function - Bathing Position: Wheelchair/chair at sink Body parts bathed by patient: Chest, Right arm, Abdomen, Right upper leg, Left upper leg, Right lower leg, Left lower leg Body parts bathed by helper: Back, Buttocks, Front perineal area, Left arm Assist Level: Touching or steadying assistance(Pt > 75%)  Function- Upper Body Dressing/Undressing What is the patient wearing?: Pull over shirt/dress Pull over shirt/dress - Perfomed by patient: Thread/unthread right sleeve, Thread/unthread left sleeve, Put head through opening, Pull shirt over trunk Pull over shirt/dress - Perfomed by helper: Pull shirt over trunk, Thread/unthread right sleeve Assist Level: Supervision or verbal cues Function - Lower Body Dressing/Undressing What is the patient wearing?: Shoes, AFO, Pants Position: Wheelchair/chair at sink Underwear - Performed by patient: Thread/unthread left underwear leg Underwear - Performed by helper: Thread/unthread right underwear leg, Pull underwear up/down Pants- Performed by patient: Thread/unthread left pants leg Pants- Performed by helper: Thread/unthread right pants leg, Thread/unthread left pants leg, Pull pants up/down, Fasten/unfasten pants Non-skid slipper socks- Performed by helper: Don/doff right sock, Don/doff left sock Socks - Performed by patient: Don/doff right sock, Don/doff left sock Socks - Performed by helper: Don/doff right sock, Don/doff left sock Shoes - Performed by  patient: Don/doff left shoe, Fasten left Shoes - Performed by helper: Fasten right, Don/doff right shoe AFO - Performed by helper: Don/doff right AFO TED Hose - Performed by patient: Don/doff right TED hose, Don/doff left TED hose TED Hose - Performed by helper: Don/doff right TED hose,  Don/doff left TED hose Assist for footwear: Partial/moderate assist Assist for lower body dressing: Touching or steadying assistance (Pt > 75%)  Function - Toileting Toileting activity did not occur: No continent bowel/bladder event Toileting steps completed by patient: Adjust clothing prior to toileting Toileting steps completed by helper: Adjust clothing prior to toileting, Performs perineal hygiene, Adjust clothing after toileting Toileting Assistive Devices: Grab bar or rail Assist level: Two helpers  Function - Air cabin crew transfer activity did not occur: Safety/medical concerns Toilet transfer assistive device: Elevated toilet seat/BSC over toilet Assist level to toilet: Moderate assist (Pt 50 - 74%/lift or lower) Assist level from toilet: Maximal assist (Pt 25 - 49%/lift and lower) Assist level to bedside commode (at bedside): 2 helpers Assist level from bedside commode (at bedside): 2 helpers  Function - Chair/bed transfer Chair/bed transfer method: Other Chair/bed transfer assist level: 2 helpers Chair/bed transfer assistive device: Mechanical lift Mechanical lift: Stedy (LUE supinated to decrease pushing) Chair/bed transfer details: Verbal cues for technique, Verbal cues for sequencing, Manual facilitation for weight shifting, Tactile cues for weight shifting   Function - Locomotion: Wheelchair Will patient use wheelchair at discharge?: Yes Type: Manual Max wheelchair distance: 100 Assist Level: Supervision or verbal cues Wheel 50 feet with 2 turns activity did not occur: Safety/medical concerns (fatigue) Assist Level: Supervision or verbal cues Wheel 150 feet activity did not occur: Safety/medical concerns (fatigue) Assist Level: Supervision or verbal cues Turns around,maneuvers to table,bed, and toilet,negotiates 3% grade,maneuvers on rugs and over doorsills: No Function - Locomotion: Ambulation Ambulation activity did not occur: Safety/medical concerns  (lack of sensation) Assistive device: Rail in hallway, Orthosis Max distance: 15 Assist level: Total assist (Pt < 25%) Walk 10 feet activity did not occur: Safety/medical concerns Assist level: Total assist (Pt < 25%) Walk 50 feet with 2 turns activity did not occur: Safety/medical concerns Walk 150 feet activity did not occur: Safety/medical concerns Walk 10 feet on uneven surfaces activity did not occur: Safety/medical concerns  Function - Comprehension Comprehension: Auditory Comprehension assist level: Understands basic 75 - 89% of the time/ requires cueing 10 - 24% of the time  Function - Expression Expression: Verbal Expression assist level: Expresses basic 50 - 74% of the time/requires cueing 25 - 49% of the time. Needs to repeat parts of sentences.  Function - Social Interaction Social Interaction assist level: Interacts appropriately 75 - 89% of the time - Needs redirection for appropriate language or to initiate interaction.  Function - Problem Solving Problem solving assist level: Solves basic 25 - 49% of the time - needs direction more than half the time to initiate, plan or complete simple activities  Function - Memory Memory assist level: Recognizes or recalls 25 - 49% of the time/requires cueing 50 - 75% of the time Patient normally able to recall (first 3 days only): Current season  Medical Problem List and Plan: 1.  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  DVT Prophylaxis/Anticoagulation: Subcutaneous Lovenox. Monitor platelet counts and any signs of bleeding, plt 436 on 2/10 3. Pain Management: Tylenol  as needed. Pain controlled at present 4. Hypertension. Norvasc 5 mg daily, lisinopril 20 mg daily. 118/76 this AM Monitor with increased mobility 5. Neuropsych: This patient is capable of making decisions on his own behalf. 6. Skin/Wound Care: Routine skin checks.  OOB. Encourage appropriate nutrition 7. Fluids/Electrolytes/Nutrition: Routine I&O's   Eating 100% of his meals 8. Hyperlipidemia. Lipitor 9. Tobacco/ cocaine/alcohol abuse. Provide counseling during rehab stay- cognitive deficits will limit effectiveness of compliance 10.  Hypoalbuminemia- mild  11. Spasticity R UE and LE still increased on Zanaflex,increased to 6mg  QID  monitor BP, added resting hand splint,Pt cannot tell me if spasticity better over the weekend, will discuss with PT 12.  Right thigh  as well as Left glut abscess, s/p I and D per Gen Surg,hydrotherapy to L glut   no IV abx unless pt febrile or with increasing pain (WBC trending down on 2/10)  Pt started on PO BactrimDS , doxy d/ced on 2/12 for MRSA wound culture  LOS (Days) 20 A FACE TO FACE EVALUATION WAS PERFORMED  Inez Rosato E 07/28/2015, 7:43 AM

## 2015-07-28 NOTE — Progress Notes (Signed)
  Subjective: Pt in good spirits Objective: Vital signs in last 24 hours: Temp:  [97.9 F (36.6 C)-98.7 F (37.1 C)] 97.9 F (36.6 C) (02/13 0449) Pulse Rate:  [93] 93 (02/12 1448) Resp:  [17-18] 17 (02/13 0449) BP: (118-140)/(76-87) 118/76 mmHg (02/12 1448) SpO2:  [95 %-99 %] 95 % (02/13 0449) Last BM Date: 07/27/15  Intake/Output from previous day: 02/12 0701 - 02/13 0700 In: 480 [P.O.:480] Out: 550 [Urine:550] Intake/Output this shift:    Incision/Wound:left buttock wound 3 cm clean  Right thigh  wound 3 cm  Open clean  Lab Results:   Recent Labs  07/25/15 0914  WBC 10.8*  HGB 13.3  HCT 40.5  PLT 436*   BMET No results for input(s): NA, K, CL, CO2, GLUCOSE, BUN, CREATININE, CALCIUM in the last 72 hours. PT/INR No results for input(s): LABPROT, INR in the last 72 hours. ABG No results for input(s): PHART, HCO3 in the last 72 hours.  Invalid input(s): PCO2, PO2  Studies/Results: No results found.  Anti-infectives: Anti-infectives    Start     Dose/Rate Route Frequency Ordered Stop   07/27/15 1100  sulfamethoxazole-trimethoprim (BACTRIM DS,SEPTRA DS) 800-160 MG per tablet 1 tablet     1 tablet Oral Every 12 hours 07/27/15 1017 08/06/15 0759   07/23/15 0800  doxycycline (VIBRA-TABS) tablet 100 mg  Status:  Discontinued     100 mg Oral Every 12 hours 07/23/15 0748 07/24/15 1525      Assessment/Plan: Left buttock/right thigh wounds Cleaning up Continue pulse lavage until d/c Wet to dry dressing changes for home   Will not need pulse lavage    LOS: 20 days    Onedia Vargus A. 07/28/2015

## 2015-07-28 NOTE — Progress Notes (Signed)
Physical Therapy Wound Treatment Patient Details  Name: Gerald [name] MRN: [id number] Date of Birth: 1960-07-14  Today's Date: 07/28/2015 Time: 3335-4562 Time Calculation (min): 20 min  Subjective  Subjective: Pt stated only a little pain Patient and Family Stated Goals: heal wound Date of Onset: 07/24/15 (first documentation of wound) Prior Treatments: dry gauze  Pain Score:  Minimal pain per pt  Wound Assessment  Wound / Incision (Open or Dehisced) 07/24/15 Other (Comment) Buttocks Left 3x3x2 (Active)  Dressing Type Barrier Film (skin prep);Gauze (Comment);Moist to dry 07/28/2015  9:07 AM  Dressing Changed Changed 07/28/2015  9:07 AM  Dressing Status Clean;Dry;Intact 07/28/2015  9:07 AM  Dressing Change Frequency Daily 07/28/2015  9:07 AM  Site / Wound Assessment Pink;Yellow 07/28/2015  9:07 AM  % Wound base Red or Granulating 80% 07/28/2015  9:07 AM  % Wound base Yellow 20% 07/28/2015  9:07 AM  % Wound base Black 0% 07/28/2015  9:07 AM  % Wound base Other (Comment) 0% 07/28/2015  9:07 AM  Peri-wound Assessment Intact;Edema;Induration;Erythema (non-blanchable) 07/28/2015  9:07 AM  Wound Length (cm) 2.4 cm 07/26/2015  1:19 PM  Wound Width (cm) 3.1 cm 07/26/2015  1:19 PM  Wound Depth (cm) 2 cm 07/26/2015  1:19 PM  Undermining (cm) 1.5 at 10 o'clock 07/26/2015  1:19 PM  Margins Unattached edges (unapproximated) 07/28/2015  9:07 AM  Closure None 07/28/2015  9:07 AM  Drainage Amount Minimal 07/28/2015  9:07 AM  Drainage Description Serosanguineous;Purulent 07/28/2015  9:07 AM  Non-staged Wound Description Full thickness 07/28/2015  9:07 AM  Treatment Hydrotherapy (Pulse lavage);Packing (Saline gauze) 07/28/2015  9:07 AM      Hydrotherapy Pulsed lavage therapy - wound location: Lt buttock Pulsed Lavage with Suction (psi): 8 psi Pulsed Lavage with Suction - Normal Saline Used: 1000 mL Pulsed Lavage Tip: Tip with splash shield   Wound Assessment and Plan  Wound Therapy -  Assess/Plan/Recommendations Wound Therapy - Clinical Statement: Wound base looking good. May be able to decr hydrotherapy to 3x a week soon. Wound Therapy - Functional Problem List: limited sitting tolerance Factors Delaying/Impairing Wound Healing: Infection - systemic/local;Immobility;Multiple medical problems;Substance abuse Hydrotherapy Plan: Debridement;Dressing change;Patient/family education;Pulsatile lavage with suction Wound Therapy - Frequency: 5X / week Wound Therapy - Current Recommendations: OT;PT;Surgery consult;WOC nurse Wound Therapy - Follow Up Recommendations: Home health RN Wound Plan: see above  Wound Therapy Goals- Improve the function of patient's integumentary system by progressing the wound(s) through the phases of wound healing (inflammation - proliferation - remodeling) by: Decrease Necrotic Tissue to: 10 Decrease Necrotic Tissue - Progress: Progressing toward goal Increase Granulation Tissue to: 90 Increase Granulation Tissue - Progress: Progressing toward goal Decrease Length/Width/Depth by (cm): -/-/0.3 Decrease Length/Width/Depth - Progress: Progressing toward goal Improve Drainage Characteristics: Min;Serous Improve Drainage Characteristics - Progress: Progressing toward goal Patient/Family will be able to : verbalize understanding of basic wound healing and hydrotherapy role Patient/Family Instruction Goal - Progress: Progressing toward goal  Goals will be updated until maximal potential achieved or discharge criteria met.  Discharge criteria: when goals achieved, discharge from hospital, MD decision/surgical intervention, no progress towards goals, refusal/missing three consecutive treatments without notification or medical reason.  GP     Gerald [name] 07/28/2015, 9:12 AM [facility] PT [phone]

## 2015-07-28 NOTE — Progress Notes (Signed)
Occupational Therapy Session Note  Patient Details  Name: Gerald [name] MRN: [id number] Date of Birth: [birth year]  Today's Date: 07/28/2015 OT Individual Time: 1100-1200 OT Individual Time Calculation (min): 60 min    Short Term Goals: Week 3:  OT Short Term Goal 1 (Week 3): Pt will maintain dynamic sitting balance during bathing tasks with mod assist. OT Short Term Goal 2 (Week 3): Pt will complete UB dressing in supported sitting with supervision. OT Short Term Goal 3 (Week 3): Pt will complete LB dressing with mod assist sit to stand. OT Short Term Goal 4 (Week 3): Pt will use the RUE as a stabilizer with mod assist during grooming and bathing tasks.   OT Short Term Goal 5 (Week 3): Pt will perform toilet transfer to the [facility] with mod assist stand pivot.  Skilled Therapeutic Interventions/Progress Updates: ADL-retraining with focus on NMR of RUE using AE (wash mit), weight-shifting while seated in w/c at sink, adapted bathing/dressing skills.    Pt challenged to direct caregiver with initial setup of BADL at sink and progressing through session with mod vc to sequence.    Pt completes UB dressing seated supported in w/c with only supervision to lace right sleeve to elbow before donning over head.    Pt maintains sitting balance during task at sink with min assist to inhibit right lateral lean while reaching, 2 times.   Pt completes bathing with overall total assist for lower body and min assist for upper body after instruction on use of wash mit to bathe left arm using right hand with wash mit.   Pt left in w/c at end of session in prep for self-feeding unassisted with QRB and arm trough secured.      Therapy Documentation Precautions:  Precautions Precautions: Fall Precaution Comments: right hemiparesis with increased flexor tone Restrictions Weight Bearing Restrictions: No  Vital Signs: Therapy Vitals Temp: 98.3 F (36.8 C) Temp Source: Oral Pulse Rate: 88 Resp: 17 BP: 114/77  mmHg Patient Position (if appropriate): Sitting Oxygen Therapy SpO2: 97 % O2 Device: Not Delivered   Pain: Pain Assessment Pain Assessment: No/denies pain  See Function Navigator for Current Functional Status.   Therapy/Group: Individual Therapy   Second session: Time: 1300-1330 Time Calculation (min):  30 min  Pain Assessment: No/denies pain  Skilled Therapeutic Interventions: ADL-retraining with focus on adapted dressing skills and seated grooming.   Pt received seated in w/c, finishing his lunch, with guest in room.  OT noted soiled shirt and advised grooming at sink (brushing teeth) and change of clothing.   Pt re-educated on one-hand method to open and apply toothpaste.   Pt then removed shirt unassisted and requested assist with shaving beard using his new trimmer.   OT unwrapped trimmer and completed 2 of 3 steps for shaving; pt applied washcloth and cleaned his face.   Pt then donned new shirt with min assist to lace right arm first.   See FIM for current functional status  Therapy/Group: Individual Therapy  Howard Patton 07/28/2015, 2:42 PM

## 2015-07-28 NOTE — Progress Notes (Signed)
Physical Therapy Session Note  Patient Details  Name: Gerald [name] MRN: [id number] Date of Birth: [birth year]  Today's Date: 07/28/2015 PT Individual Time: 1000-1058 PT Individual Time Calculation (min): 58 min   Short Term Goals: Week 3:  PT Short Term Goal 1 (Week 3): pt will perform functional transfers with mod A consistently PT Short Term Goal 2 (Week 3): pt will perform standing balance for therapeutic task with mod A PT Short Term Goal 3 (Week 3): pt will perform supine to sit with mod A  Skilled Therapeutic Interventions/Progress Updates:    Pt received resting in bed and agreeable to therapy session.  Session focus on transfers, self directed care, NMR, and attention.  Pt performed supine>sitting EOB with bedrails/HOB elevated with min assist for management of RLE and verbal cues for technique.  Slide board transfer mod assist +1 with mod questions cues for pt to self direct w/c set up and transfer.  Pt propelled w/c to therapy gym with supervision and verbal cues for attention to R side.  Stand/pivot transfer w/c>Nustep with +2 for balance and max verbal/tactile cues for sequence.  Nustep 3x 3 minutes at level 4 focus on NMR and reciprocal movement and attention to timer.  Pt able to stop at 3 minute intervals with min question cues on first trial and supervision there after.  Stand/pivot back to w/c +2 for balance and verbal/tactlie cues for sequence.  Squat pivot w/c>therapy mat with max assist +1 and manual facilitation for head/hips relationship.  PT instructed pt in repeated sit<>squat focus on equal weight bearing through LEs x7 reps with max assist each trial.  Squat/pivot back to w/c with max assist +1 and pt demonstrating improved coordination.  Pt returned to room and positioned in w/c with QRB in place, call bell in reach, and needs met.   Therapy Documentation Precautions:  Precautions Precautions: Fall Precaution Comments: right hemiparesis with increased flexor  tone Restrictions Weight Bearing Restrictions: No Pain: Pain Assessment Pain Assessment: No/denies pain   See Function Navigator for Current Functional Status.   Therapy/Group: Individual Therapy  Marcia Lepera E Penven-Crew 07/28/2015, 12:12 PM

## 2015-07-28 NOTE — Progress Notes (Signed)
Speech Language Pathology Daily Session Note  Patient Details  Name: Gerald [name] MRN: [id number] Date of Birth: 1961-05-07  Today's Date: 07/28/2015 SLP Individual Time: [id number] SLP Individual Time Calculation (min): 60 min  Short Term Goals: Week 3: SLP Short Term Goal 1 (Week 3): Pt will follow 3-step directives with 75% accuracy and min A verbal cues.  SLP Short Term Goal 2 (Week 3): Pt will communicate basic needs/wants via phrase level verbal expression with mod  A. SLP Short Term Goal 3 (Week 3): Patient will self-monitor and correct verbal errors within structured tasks with Mod A verbal and visual cues.  SLP Short Term Goal 4 (Week 3): Patient will utilize call bell to request assistance in 25% of opportunities with Max A verbal and question cues.  SLP Short Term Goal 5 (Week 3): Patient will demonstrate selective attention to a funcitonal task in a mildly distracting enviornment for 30 minutes with Min A verbal cues for redirection.  SLP Short Term Goal 6 (Week 3): Patient will recall/carryover new information in regards to technique for transfers, ADL's etc, with Mod A question and verbal cues.   Skilled Therapeutic Interventions: Skilled treatment session focused on addressing cognitive-linguistic goals. Patient participated in structured language tasks for ~50 minutes with in a mildly distracting environment with Min verbal cues for redirections.  SLP facilitated session with sentence level receptive tasks with 2-3 step directions; patient independently utilized finger pacing and re-reading bu required Min-Mod cues for overall accuracy.  During conversational exchanges at the phrase level patient also benefited from semantic and initial letter cues.  Continue with current plan of care.    Function:  Eating Eating   Modified Consistency Diet: No Eating Assist Level: Supervision or verbal cues;Set up assist for   Eating Set Up Assist For: Opening containers        Cognition Comprehension Comprehension assist level: Understands basic 75 - 89% of the time/ requires cueing 10 - 24% of the time  Expression   Expression assist level: Expresses basic 50 - 74% of the time/requires cueing 25 - 49% of the time. Needs to repeat parts of sentences.  Social Interaction Social Interaction assist level: Interacts appropriately 75 - 89% of the time - Needs redirection for appropriate language or to initiate interaction.  Problem Solving Problem solving assist level: Solves basic 25 - 49% of the time - needs direction more than half the time to initiate, plan or complete simple activities  Memory Memory assist level: Recognizes or recalls 50 - 74% of the time/requires cueing 25 - 49% of the time    Pain Pain Assessment Pain Assessment: No/denies pain  Therapy/Group: Individual Therapy  Carmelia Roller., CCC-SLP [id number]  [place] 07/28/2015, 4:30 PM

## 2015-07-29 ENCOUNTER — Inpatient Hospital Stay (HOSPITAL_COMMUNITY): Payer: Self-pay | Admitting: Physical Therapy

## 2015-07-29 ENCOUNTER — Ambulatory Visit (HOSPITAL_COMMUNITY): Payer: Self-pay

## 2015-07-29 ENCOUNTER — Inpatient Hospital Stay (HOSPITAL_COMMUNITY): Payer: Medicaid Other | Admitting: Occupational Therapy

## 2015-07-29 ENCOUNTER — Inpatient Hospital Stay (HOSPITAL_COMMUNITY): Payer: Medicaid Other | Admitting: Speech Pathology

## 2015-07-29 LAB — CREATININE, SERUM
Creatinine, Ser: 1.38 mg/dL — ABNORMAL HIGH (ref 0.61–1.24)
GFR calc Af Amer: >60 mL/min (ref >60)
GFR calc non Af Amer: 57 mL/min — ABNORMAL LOW (ref >60)

## 2015-07-29 NOTE — Progress Notes (Signed)
Speech Language Pathology Daily Session Note  Patient Details  Name: Gerald [name] MRN: [id number] Date of Birth: 09/15/1960  Today's Date: 07/29/2015 SLP Individual Time: 65-1530 SLP Individual Time Calculation (min): 55 min  Short Term Goals: Week 3: SLP Short Term Goal 1 (Week 3): Pt will follow 3-step directives with 75% accuracy and min A verbal cues.  SLP Short Term Goal 2 (Week 3): Pt will communicate basic needs/wants via phrase level verbal expression with mod  A. SLP Short Term Goal 3 (Week 3): Patient will self-monitor and correct verbal errors within structured tasks with Mod A verbal and visual cues.  SLP Short Term Goal 4 (Week 3): Patient will utilize call bell to request assistance in 25% of opportunities with Max A verbal and question cues.  SLP Short Term Goal 5 (Week 3): Patient will demonstrate selective attention to a funcitonal task in a mildly distracting enviornment for 30 minutes with Min A verbal cues for redirection.  SLP Short Term Goal 6 (Week 3): Patient will recall/carryover new information in regards to technique for transfers, ADL's etc, with Mod A question and verbal cues.   Skilled Therapeutic Interventions:  Pt was seen for skilled ST targeting communication goals.  Pt was able to identify objects in pictures for 100% accuracy with supervision cues; he then utilized words from the abovementioned pictures for sentence generation with min-mod assist verbal cues for expansion of utterances, inclusion of functor words, and phonemic placement.  SLP also facilitated the session with a word description task to further challenge pt's functional communication.  Pt required max assist verbal cues to describe words on playing cards due to perseveration as well as phonemic and semantic paraphasic errors. However, during the abovementioned task pt was able to responsively name abstract people, places, and objects for 100% accuracy with overall supervision.  Pt was returned  to room and left in wheelchair with quick release belt donned and with call bell within reach.  Continue per current plan of care.    Function:  Eating Eating                 Cognition Comprehension Comprehension assist level: Understands basic 75 - 89% of the time/ requires cueing 10 - 24% of the time  Expression   Expression assist level: Expresses basic 50 - 74% of the time/requires cueing 25 - 49% of the time. Needs to repeat parts of sentences.  Social Interaction Social Interaction assist level: Interacts appropriately 75 - 89% of the time - Needs redirection for appropriate language or to initiate interaction.  Problem Solving Problem solving assist level: Solves basic 25 - 49% of the time - needs direction more than half the time to initiate, plan or complete simple activities  Memory Memory assist level: Recognizes or recalls 50 - 74% of the time/requires cueing 25 - 49% of the time    Pain Pain Assessment Pain Assessment: No/denies pain  Therapy/Group: Individual Therapy  Estephany Perot, [name] 07/29/2015, 4:32 PM

## 2015-07-29 NOTE — Progress Notes (Signed)
Subjective/Complaints: No issues overnite per pt  ROS- limited by cognition and speech, but appears to deny CP, SOB, N/V/D  Objective: Vital Signs: Blood pressure 109/77, pulse 86, temperature 98.2 F (36.8 C), temperature source Oral, resp. rate 18, height 6' (1.829 m), weight 78.926 kg (174 lb), SpO2 96 %. No results found. Results for orders placed or performed during the hospital encounter of 07/08/15 (from the past 72 hour(s))  Creatinine, serum     Status: Abnormal   Collection Time: 07/29/15  5:37 AM  Result Value Ref Range   Creatinine, Ser 1.38 (H) 0.61 - 1.24 mg/dL   GFR calc non Af Amer 57 (L) >60 mL/min   GFR calc Af Amer >60 >60 mL/min    Comment: (NOTE) The eGFR has been calculated using the CKD EPI equation. This calculation has not been validated in all clinical situations. eGFR's persistently <60 mL/min signify possible Chronic Kidney Disease.      HEENT: Normocephalic, atraumatic. poor dentition.  Cardio: RRR and no murmur Resp: Unlabored breathing, bilaterally clear to auscultation. GI: BS positive and NT, ND Skin:  No new lesions on visible skin. Warm and dry. Neuro: Alert.  Motor: RUE: 2-/5 elbow extension, 3/5 grip  RLE: 0/5 proximal distal  Expressive aphasia Dysarthric speech Musc/Skel:  No tenderness. No edema. Gen: NAD vital signs reviewed. Psych: Flat affect. normal behavior.  Assessment/Plan: 1. Functional deficits secondary to Left ACA infarct with Right HP which require 3+ hours per day of interdisciplinary therapy in a comprehensive inpatient rehab setting. Physiatrist is providing close team supervision and 24 hour management of active medical problems listed below. Physiatrist and rehab team continue to assess barriers to discharge/monitor patient progress toward functional and medical goals. FIM: Function - Bathing Position: Wheelchair/chair at sink Body parts bathed by patient: Right arm, Left arm, Chest, Abdomen, Front perineal  area, Right upper leg, Left upper leg Body parts bathed by helper: Buttocks, Right lower leg, Left lower leg, Back Assist Level: Touching or steadying assistance(Pt > 75%)  Function- Upper Body Dressing/Undressing What is the patient wearing?: Pull over shirt/dress Pull over shirt/dress - Perfomed by patient: Thread/unthread left sleeve, Put head through opening, Pull shirt over trunk Pull over shirt/dress - Perfomed by helper: Thread/unthread right sleeve Assist Level: Touching or steadying assistance(Pt > 75%) Function - Lower Body Dressing/Undressing What is the patient wearing?: Underwear, Pants, [employer], Shoes Position: Standing at [employer] - Performed by patient: Thread/unthread left underwear leg Underwear - Performed by helper: Thread/unthread right underwear leg, Pull underwear up/down Pants- Performed by patient: Thread/unthread left pants leg Pants- Performed by helper: Thread/unthread right pants leg, Pull pants up/down, Fasten/unfasten pants Non-skid slipper socks- Performed by helper: Don/doff right sock, Don/doff left sock Socks - Performed by patient: Don/doff right sock, Don/doff left sock Socks - Performed by helper: Don/doff right sock, Don/doff left sock Shoes - Performed by patient: Don/doff left shoe, Fasten left Shoes - Performed by helper: Don/doff right shoe, Don/doff left shoe, Fasten right, Fasten left AFO - Performed by helper: Don/doff right AFO TED Hose - Performed by patient: Don/doff right TED hose, Don/doff left TED hose TED Hose - Performed by helper: Don/doff right TED hose, Don/doff left TED hose Assist for footwear: Partial/moderate assist Assist for lower body dressing: Touching or steadying assistance (Pt > 75%)  Function - Toileting Toileting activity did not occur: No continent bowel/bladder event Toileting steps completed by patient: Adjust clothing prior to toileting Toileting steps completed by helper: Adjust clothing prior to  toileting, Performs perineal hygiene, Adjust clothing after toileting Toileting Assistive Devices: Grab bar or rail Assist level: Two helpers  Function - Air cabin crew transfer activity did not occur: Safety/medical concerns Toilet transfer assistive device: Elevated toilet seat/BSC over toilet Assist level to toilet: Moderate assist (Pt 50 - 74%/lift or lower) Assist level from toilet: Maximal assist (Pt 25 - 49%/lift and lower) Assist level to bedside commode (at bedside): 2 helpers Assist level from bedside commode (at bedside): 2 helpers  Function - Chair/bed transfer Chair/bed transfer method: Lateral scoot, Squat pivot, Stand pivot Chair/bed transfer assist level: 2 helpers (mod assist for lateral scoot, max assist for squat/pivot, +2 for stand pivot) Chair/bed transfer assistive device: Armrests Mechanical lift: Stedy (LUE supinated to decrease pushing) Chair/bed transfer details: Verbal cues for technique, Verbal cues for sequencing, Manual facilitation for weight shifting, Tactile cues for weight shifting, Manual facilitation for placement, Manual facilitation for weight bearing   Function - Locomotion: Wheelchair Will patient use wheelchair at discharge?: Yes Type: Manual Max wheelchair distance: 150 Assist Level: Supervision or verbal cues Wheel 50 feet with 2 turns activity did not occur: Safety/medical concerns (fatigue) Assist Level: Supervision or verbal cues Wheel 150 feet activity did not occur: Safety/medical concerns (fatigue) Assist Level: Supervision or verbal cues Turns around,maneuvers to table,bed, and toilet,negotiates 3% grade,maneuvers on rugs and over doorsills: No Function - Locomotion: Ambulation Ambulation activity did not occur: Safety/medical concerns (lack of sensation) Assistive device: Rail in hallway, Orthosis Max distance: 15 Assist level: Total assist (Pt < 25%) Walk 10 feet activity did not occur: Safety/medical concerns Assist  level: Total assist (Pt < 25%) Walk 50 feet with 2 turns activity did not occur: Safety/medical concerns Walk 150 feet activity did not occur: Safety/medical concerns Walk 10 feet on uneven surfaces activity did not occur: Safety/medical concerns  Function - Comprehension Comprehension: Auditory Comprehension assist level: Understands basic 75 - 89% of the time/ requires cueing 10 - 24% of the time  Function - Expression Expression: Verbal Expression assist level: Expresses basic 50 - 74% of the time/requires cueing 25 - 49% of the time. Needs to repeat parts of sentences.  Function - Social Interaction Social Interaction assist level: Interacts appropriately 75 - 89% of the time - Needs redirection for appropriate language or to initiate interaction.  Function - Problem Solving Problem solving assist level: Solves basic 25 - 49% of the time - needs direction more than half the time to initiate, plan or complete simple activities  Function - Memory Memory assist level: Recognizes or recalls 50 - 74% of the time/requires cueing 25 - 49% of the time Patient normally able to recall (first 3 days only): Current season  Medical Problem List and Plan: 1.  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  DVT Prophylaxis/Anticoagulation: Subcutaneous Lovenox. Monitor platelet counts and any signs of bleeding, plt 436 on 2/10 3. Pain Management: Tylenol as needed. Pain controlled at present 4. Hypertension. Norvasc 5 mg daily, lisinopril 20 mg daily. 109/77 this AM Monitor with increased mobility 5. Neuropsych: This patient is capable of making decisions on his own behalf. 6. Skin/Wound Care: Routine skin checks. OOB. Encourage appropriate nutrition 7. Fluids/Electrolytes/Nutrition: Routine I&O's   Eating 100% of his meals8. Hyperlipidemia. Lipitor 9. Tobacco/ cocaine/alcohol  abuse. Provide counseling during rehab stay- cognitive deficits will limit  effectiveness of compliance 10.  Hypoalbuminemia- mild  11. Spasticity R UE and LE still increased on Zanaflex,increased to 5m QID  monitor BP, added resting hand splint, 12.  Right thigh  as well as Left glut abscess, s/p I and D per Gen Surg,hydrotherapy to L glut only during inpt  hospitalization  no IV abx unless pt febrile or with increasing pain (WBC trending down on 2/10)  Pt started on PO BactrimDS , doxy d/ced on 2/12 for MRSA wound culture  LOS (Days) 21 A FACE TO FACE EVALUATION WAS PERFORMED  Azarian Starace E 07/29/2015, 7:54 AM

## 2015-07-29 NOTE — Progress Notes (Signed)
Physical Therapy Weekly Progress Note  Patient Details  Name: Gerald [name] MRN: [id number] Date of Birth: 1960/12/30  Beginning of progress report period: July 22, 2015 End of progress report period: July 29, 2015  Today's Date: 07/29/2015 PT Individual Time: 1300-1410 PT Individual Time Calculation (min): 70 min   Pt initially min A with w/c mobility due to difficulty with steering due to apraxia, max visual and verbal cues for technique, at end of session pt with supervision with w/c mobility without cues.  Standing balance with pt mod A for sit to stand, mod A for standing balance with reaching for horseshoe toss.  Standing with 1'' block under L LE with mod A for reaching task with forced use of R LE.  Pt still with only trace palpable quad contraction on Rt.  Gait with Harmon Pier walker 9', 15' with max A for wt shift and Rt LE advancement with Rt AFO donned. +2 for steering and assist during gait.  Stretching Rt UE to reduce tone and reduce pain, pt able to tolerate 15 minutes gentle long duration stretching and rotation to decrease Rt UE tone.  Patient has met 3 of 3 short term goals.  Pt has improved to mod A with mobility and transfers.  Pt fluctuates between mod and max A for standing balance due to fatigue and pushing to Rt. Pt is improving sit to stands and this session was consistently mod A.  Patient continues to demonstrate the following deficits: apraxia, increased R UE and LE tone, pushing, R sided weakness, decreased balance and therefore will continue to benefit from skilled PT intervention to enhance overall performance with activity tolerance, balance, postural control, ability to compensate for deficits and functional use of  right upper extremity and right lower extremity.  Patient progressing toward long term goals..  Continue plan of care.  PT Short Term Goals Week 3:  PT Short Term Goal 1 (Week 3): pt will perform functional transfers with mod A consistently PT Short  Term Goal 1 - Progress (Week 3): Met PT Short Term Goal 2 (Week 3): pt will perform standing balance for therapeutic task with mod A PT Short Term Goal 2 - Progress (Week 3): Met PT Short Term Goal 3 (Week 3): pt will perform supine to sit with mod A PT Short Term Goal 3 - Progress (Week 3): Met Week 4:  PT Short Term Goal 1 (Week 4): pt will perform standing balance task consistently with mod A PT Short Term Goal 2 (Week 4): Pt will perform functional transfers consistently with min A  Skilled Therapeutic Interventions/Progress Updates:  Ambulation/gait training;Balance/vestibular training;Cognitive remediation/compensation;Community reintegration;Discharge planning;Neuromuscular re-education;Functional mobility training;Functional electrical stimulation;DME/adaptive equipment instruction;Pain management;Patient/family education;Psychosocial support;Splinting/orthotics;UE/LE Coordination activities;UE/LE Strength taining/ROM;Therapeutic Exercise;Therapeutic Activities;Stair training;Wheelchair propulsion/positioning   Therapy Documentation Pain: Pain Assessment Pain Assessment: No/denies pain See Function Navigator for Current Functional Status.  Therapy/Group: Individual Therapy  Mystique Bjelland 07/29/2015, 2:10 PM

## 2015-07-29 NOTE — Progress Notes (Signed)
Occupational Therapy Session Note  Patient Details  Name: Gerald [name] MRN: [id number] Date of Birth: [birth year]  Today's Date: 07/29/2015 OT Individual Time: [id number] OT Individual Time Calculation (min): 60 min    Short Term Goals: Week 3:  OT Short Term Goal 1 (Week 3): Pt will maintain dynamic sitting balance during bathing tasks with mod assist. OT Short Term Goal 2 (Week 3): Pt will complete UB dressing in supported sitting with supervision. OT Short Term Goal 3 (Week 3): Pt will complete LB dressing with mod assist sit to stand. OT Short Term Goal 4 (Week 3): Pt will use the RUE as a stabilizer with mod assist during grooming and bathing tasks.   OT Short Term Goal 5 (Week 3): Pt will perform toilet transfer to the [facility] with mod assist stand pivot.  Skilled Therapeutic Interventions/Progress Updates:    Pt completed bathing and dressing sit to stand at sink level this session, from wheelchair.  He needed mod assist for RUE functional use to wash the left arm.  Pt still with decreased motor planning when attempting to let go of objects with the RUE, once he has grasped them.  He was able to complete sit to stand during LB selfcare with mod assist but still needs max assist to complete LB dressing secondary to not being able to carryover hemidressing techniques.  Min assist with mod instructional cueing for UB dressing in order to donn a pullover shirt. Pt left in wheelchair with call button within reach and safety belt in place.  Left arm trough also positioned to help support the RUE.     Therapy Documentation Precautions:  Precautions Precautions: Fall Precaution Comments: right hemiparesis with increased flexor tone Restrictions Weight Bearing Restrictions: No  Pain: Pain Assessment Pain Assessment: No/denies pain ADL: See Function Navigator for Current Functional Status.   Therapy/Group: Individual Therapy  Jhonny Calixto OTR/L 07/29/2015, 1:45 PM

## 2015-07-29 NOTE — Progress Notes (Signed)
Physical Therapy Wound Treatment Patient Details  Name: Gerald [name] MRN: [id number] Date of Birth: [birth year]  Today's Date: 07/29/2015 Time: 2751-7001 Time Calculation (min): 16 min  Subjective  Subjective: No c/o's Patient and Family Stated Goals: heal wound Date of Onset: 07/24/15 (first documentation of wound) Prior Treatments: dry gauze  Pain Score:  Minimal pain  Wound Assessment  Wound / Incision (Open or Dehisced) 07/24/15 Other (Comment) Buttocks Left 3x3x2 (Active)  Dressing Type Barrier Film (skin prep);Gauze (Comment);Moist to dry 07/29/2015  9:46 AM  Dressing Changed Changed 07/28/2015 10:50 PM  Dressing Status Clean;Dry;Intact 07/29/2015  9:46 AM  Dressing Change Frequency Daily 07/29/2015  9:46 AM  Site / Wound Assessment Pink;Yellow 07/29/2015  9:46 AM  % Wound base Red or Granulating 85% 07/29/2015  9:46 AM  % Wound base Yellow 15% 07/29/2015  9:46 AM  % Wound base Black 0% 07/29/2015  9:46 AM  % Wound base Other (Comment) 0% 07/29/2015  9:46 AM  Peri-wound Assessment Intact 07/29/2015  9:46 AM  Wound Length (cm) 2.4 cm 07/26/2015  1:19 PM  Wound Width (cm) 3.1 cm 07/26/2015  1:19 PM  Wound Depth (cm) 2 cm 07/26/2015  1:19 PM  Undermining (cm) 1.5 at 10 o'clock 07/26/2015  1:19 PM  Margins Unattached edges (unapproximated) 07/29/2015  9:46 AM  Closure None 07/29/2015  9:46 AM  Drainage Amount Minimal 07/29/2015  9:46 AM  Drainage Description Serosanguineous 07/29/2015  9:46 AM  Non-staged Wound Description Full thickness 07/28/2015 10:50 PM  Treatment Hydrotherapy (Pulse lavage);Packing (Saline gauze) 07/29/2015  9:46 AM      Hydrotherapy Pulsed lavage therapy - wound location: Lt buttock Pulsed Lavage with Suction (psi): 8 psi Pulsed Lavage with Suction - Normal Saline Used: 1000 mL Pulsed Lavage Tip: Tip with splash shield   Wound Assessment and Plan  Wound Therapy - Assess/Plan/Recommendations Wound Therapy - Clinical Statement: Wound base looking good. May be able  to decr hydrotherapy to 3x a week soon. Wound Therapy - Functional Problem List: limited sitting tolerance Factors Delaying/Impairing Wound Healing: Infection - systemic/local;Immobility;Multiple medical problems;Substance abuse Hydrotherapy Plan: Debridement;Dressing change;Patient/family education;Pulsatile lavage with suction Wound Therapy - Frequency: 5X / week Wound Therapy - Follow Up Recommendations: Home health RN Wound Plan: see above  Wound Therapy Goals- Improve the function of patient's integumentary system by progressing the wound(s) through the phases of wound healing (inflammation - proliferation - remodeling) by: Decrease Necrotic Tissue to: 10 Decrease Necrotic Tissue - Progress: Progressing toward goal Increase Granulation Tissue to: 90 Increase Granulation Tissue - Progress: Progressing toward goal Decrease Length/Width/Depth by (cm): -/-/0.3 Decrease Length/Width/Depth - Progress: Progressing toward goal Improve Drainage Characteristics: Min;Serous Improve Drainage Characteristics - Progress: Progressing toward goal Patient/Family will be able to : verbalize understanding of basic wound healing and hydrotherapy role Patient/Family Instruction Goal - Progress: Progressing toward goal  Goals will be updated until maximal potential achieved or discharge criteria met.  Discharge criteria: when goals achieved, discharge from hospital, MD decision/surgical intervention, no progress towards goals, refusal/missing three consecutive treatments without notification or medical reason.  GP     Gerald [name] 07/29/2015, 11:21 AM Suanne Marker PT [phone]

## 2015-07-30 ENCOUNTER — Inpatient Hospital Stay (HOSPITAL_COMMUNITY): Payer: Medicaid Other | Admitting: Occupational Therapy

## 2015-07-30 ENCOUNTER — Inpatient Hospital Stay (HOSPITAL_COMMUNITY): Payer: Self-pay | Admitting: Physical Therapy

## 2015-07-30 ENCOUNTER — Ambulatory Visit (HOSPITAL_COMMUNITY): Payer: Self-pay

## 2015-07-30 ENCOUNTER — Inpatient Hospital Stay (HOSPITAL_COMMUNITY): Payer: Medicaid Other | Admitting: Speech Pathology

## 2015-07-30 NOTE — Progress Notes (Signed)
Occupational Therapy Weekly Progress Note  Patient Details  Name: Gerald [name] MRN: [id number] Date of Birth: 12-Aug-1960  Beginning of progress report period: July 24, 2015 End of progress report period: July 30, 2015  Today's Date: 07/30/2015 OT Individual Time: 1324-4010 OT Individual Time Calculation (min): 44 min    Patient has met 2 of 5 short term goals.  Gerald [name] continues to need mod assist for LB selfcare and toileting tasks sit to stand.   Min assist needed for UB selfcare.  Squat pivot transfers are at a mod assist level with mod demonstrational cueing for technique and wheelchair setup.  Max assist for stand pivot transfers.  Increased flexor tone noted in the biceps and right chest at rest, with pt still needing max assist to integrate the RUE into basic selfcare tasks.  He is able to demonstrate full gross digit flexion and extension but not consistent to command.  When objects are placed in his hand he will grip them, and when they are removed he will involuntarily grip them harder.  Trace shoulder flexion noted with more activity in internal rotators than any other part of the shoulder.  Gerald [name] continues to demonstrate significant motor planning deficits at times and needs continual cueing for carry over of techniques for dressing and for transfers day to day.  Plan for family education tomorrow with family.  Unsure if they can provide assist at current level.  Will continue with progression toward established min to mod assist goals for discharge.   Patient continues to demonstrate the following deficits: decreased balance, decreased RUE functional use, decreased motor planning, decreased memory, and therefore will continue to benefit from skilled OT intervention to enhance overall performance with BADL.  Patient progressing toward long term goals..  Continue plan of care.  OT Short Term Goals Week 4:  OT Short Term Goal 1 (Week 4): Continue working toward  established [place].  Skilled Therapeutic Interventions/Progress Updates:    Gerald [name] rolled himself down to the ADL apartment to practice tub/shower transfers squat pivot to tub bench from the wheelchair.  He needed max instructional cueing and mod assist to complete, initially attempting stand pivot even though therapist gave him max demonstrational cueing to stay in the squat position.  Mod assist needed for bringing RLE into the tub and out when transferring back to chair.  Second part of session focused on standing balance at the cabinet while having him use the LUE to wipe the counter top with the RUE.  Max hand over hand assist with emphasis on external rotation and abduction.  He was able to active internal movements of the shoulder and adduction but no active ability to move out of these movements which were also influenced by tone in the pectoral.  He was able to maintain digit extension with hand on the washcloth and on the surface of the counter.  Completed 3 intervals of this with mod assist to maintain standing balance and with him standing for 3-5 mins during each interval.  Returned to room at end of session with pt left in wheelchair with safety belt in place.    Therapy Documentation Precautions:  Precautions Precautions: Fall Precaution Comments: right hemiparesis with increased flexor tone Restrictions Weight Bearing Restrictions: No  Pain: Pain Assessment Pain Assessment: No/denies pain ADL: See Function Navigator for Current Functional Status.   Therapy/Group: Individual Therapy  Campbell Agramonte OTR/L 07/30/2015, 4:07 PM

## 2015-07-30 NOTE — Progress Notes (Signed)
Subjective/Complaints: No issues overnite per pt  ROS- limited by cognition and speech, but appears to deny CP, SOB, N/V/D  Objective: Vital Signs: Blood pressure 119/70, pulse 80, temperature 97.9 F (36.6 C), temperature source Oral, resp. rate 18, height 6' (1.829 m), weight 78.926 kg (174 lb), SpO2 97 %. No results found. Results for orders placed or performed during the hospital encounter of 07/08/15 (from the past 72 hour(s))  Creatinine, serum     Status: Abnormal   Collection Time: 07/29/15  5:37 AM  Result Value Ref Range   Creatinine, Ser 1.38 (H) 0.61 - 1.24 mg/dL   GFR calc non Af Amer 57 (L) >60 mL/min   GFR calc Af Amer >60 >60 mL/min    Comment: (NOTE) The eGFR has been calculated using the CKD EPI equation. This calculation has not been validated in all clinical situations. eGFR's persistently <60 mL/min signify possible Chronic Kidney Disease.      HEENT: Normocephalic, atraumatic. poor dentition.  Cardio: RRR and no murmur Resp: Unlabored breathing, bilaterally clear to auscultation. GI: BS positive and NT, ND Skin:  No new lesions on visible skin. Warm and dry. Neuro: Alert.  Motor: RUE: 2-/5 elbow extension, 3/5 grip  RLE: 0/5 proximal distal  Expressive aphasia Dysarthric speech Musc/Skel:  No tenderness. No edema. Gen: NAD vital signs reviewed. Psych: Flat affect. normal behavior.  Assessment/Plan: 1. Functional deficits secondary to Left ACA infarct with Right HP which require 3+ hours per day of interdisciplinary therapy in a comprehensive inpatient rehab setting. Physiatrist is providing close team supervision and 24 hour management of active medical problems listed below. Physiatrist and rehab team continue to assess barriers to discharge/monitor patient progress toward functional and medical goals. FIM: Function - Bathing Position: Wheelchair/chair at sink Body parts bathed by patient: Right arm, Left arm, Chest, Abdomen, Front perineal  area, Right upper leg, Left upper leg Body parts bathed by helper: Back, Left lower leg, Right lower leg, Buttocks Assist Level: Touching or steadying assistance(Pt > 75%)  Function- Upper Body Dressing/Undressing What is the patient wearing?: Pull over shirt/dress Pull over shirt/dress - Perfomed by patient: Thread/unthread left sleeve, Put head through opening Pull over shirt/dress - Perfomed by helper: Thread/unthread right sleeve Assist Level: Touching or steadying assistance(Pt > 75%) Function - Lower Body Dressing/Undressing What is the patient wearing?: Underwear, Pants, Maryln Manuel, Shoes Position: Education officer, museum at [employer] - Performed by patient: Thread/unthread left underwear leg Underwear - Performed by helper: Thread/unthread right underwear leg, Pull underwear up/down Pants- Performed by patient: Thread/unthread left pants leg Pants- Performed by helper: Thread/unthread right pants leg, Pull pants up/down Non-skid slipper socks- Performed by patient: Don/doff left sock Non-skid slipper socks- Performed by helper: Don/doff right sock Socks - Performed by patient: Don/doff right sock, Don/doff left sock Socks - Performed by helper: Don/doff right sock, Don/doff left sock Shoes - Performed by patient: Don/doff left shoe, Fasten right, Fasten left Shoes - Performed by helper: Don/doff right shoe AFO - Performed by helper: Don/doff right AFO TED Hose - Performed by patient: Don/doff right TED hose, Don/doff left TED hose TED Hose - Performed by helper: Don/doff right TED hose, Don/doff left TED hose Assist for footwear: Partial/moderate assist Assist for lower body dressing: Touching or steadying assistance (Pt > 75%)  Function - Toileting Toileting activity did not occur: No continent bowel/bladder event Toileting steps completed by patient: Adjust clothing prior to toileting Toileting steps completed by helper: Adjust clothing prior to toileting, Performs perineal  hygiene,  Adjust clothing after toileting Toileting Assistive Devices: Grab bar or rail Assist level: Two helpers  Function - Air cabin crew transfer activity did not occur: Safety/medical concerns Toilet transfer assistive device: Elevated toilet seat/BSC over toilet Assist level to toilet: Moderate assist (Pt 50 - 74%/lift or lower) Assist level from toilet: Maximal assist (Pt 25 - 49%/lift and lower) Assist level to bedside commode (at bedside): 2 helpers Assist level from bedside commode (at bedside): 2 helpers  Function - Chair/bed transfer Chair/bed transfer method: Lateral scoot, Squat pivot, Stand pivot Chair/bed transfer assist level: 2 helpers Chair/bed transfer assistive device: Armrests Mechanical lift: Stedy (LUE supinated to decrease pushing) Chair/bed transfer details: Verbal cues for technique, Verbal cues for sequencing, Manual facilitation for weight shifting, Tactile cues for weight shifting, Manual facilitation for placement, Manual facilitation for weight bearing   Function - Locomotion: Wheelchair Will patient use wheelchair at discharge?: Yes Type: Manual Max wheelchair distance: 150 Assist Level: Supervision or verbal cues Wheel 50 feet with 2 turns activity did not occur: Safety/medical concerns (fatigue) Assist Level: Supervision or verbal cues Wheel 150 feet activity did not occur: Safety/medical concerns (fatigue) Assist Level: Supervision or verbal cues Turns around,maneuvers to table,bed, and toilet,negotiates 3% grade,maneuvers on rugs and over doorsills: No Function - Locomotion: Ambulation Ambulation activity did not occur: Safety/medical concerns (lack of sensation) Assistive device: Rail in hallway, Orthosis Max distance: 15 Assist level: Total assist (Pt < 25%) Walk 10 feet activity did not occur: Safety/medical concerns Assist level: Total assist (Pt < 25%) Walk 50 feet with 2 turns activity did not occur: Safety/medical concerns Walk  150 feet activity did not occur: Safety/medical concerns Walk 10 feet on uneven surfaces activity did not occur: Safety/medical concerns  Function - Comprehension Comprehension: Auditory Comprehension assist level: Understands basic 75 - 89% of the time/ requires cueing 10 - 24% of the time  Function - Expression Expression: Verbal Expression assist level: Expresses basic 50 - 74% of the time/requires cueing 25 - 49% of the time. Needs to repeat parts of sentences.  Function - Social Interaction Social Interaction assist level: Interacts appropriately 75 - 89% of the time - Needs redirection for appropriate language or to initiate interaction.  Function - Problem Solving Problem solving assist level: Solves basic 25 - 49% of the time - needs direction more than half the time to initiate, plan or complete simple activities  Function - Memory Memory assist level: Recognizes or recalls 50 - 74% of the time/requires cueing 25 - 49% of the time Patient normally able to recall (first 3 days only): Current season  Medical Problem List and Plan: 1.  Right hemiparesis, Right hemisensory deficits and dysarthria secondary to left ACA territory infarct-expect UE>LE recovery,Team conference today please see physician documentation under team conference tab, met with team face-to-face to discuss problems,progress, and goals. Formulized individual treatment plan based on medical history, underlying problem and comorbidities.                  2.  DVT Prophylaxis/Anticoagulation: Subcutaneous Lovenox. Monitor platelet counts and any signs of bleeding, plt 436 on 2/10 3. Pain Management: Tylenol as needed. Pain controlled at present 4. Hypertension. Norvasc 5 mg daily, lisinopril 20 mg daily. 119/70 this AM Monitor with increased mobility, cont current meds 5. Neuropsych: This patient is capable of making decisions on his own behalf. 6. Skin/Wound Care: Routine skin checks. OOB. Encourage appropriate  nutrition 7. Fluids/Electrolytes/Nutrition: Routine I&O's   Eating 100% of his meals8. Hyperlipidemia. Lipitor  9. Tobacco/ cocaine/alcohol abuse. Provide counseling during rehab stay- cognitive deficits will limit effectiveness of compliance 10.  Hypoalbuminemia- mild  11. Spasticity R UE and LE still increased on Zanaflex,increased to 18m QID  monitor BP, added resting hand splint, 12.  Right thigh  as well as Left glut abscess, s/p I and D per PT/hydrotherapist-wound is looking good and hydro may be d/ced soon  no IV abx unless pt febrile or with increasing pain (WBC trending down on 2/10)  Pt started on PO BactrimDS , doxy d/ced on 2/12 for MRSA wound culture  LOS (Days) 22 A FACE TO FACE EVALUATION WAS PERFORMED  Srinivas Lippman E 07/30/2015, 7:10 AM

## 2015-07-30 NOTE — Patient Care Conference (Signed)
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 Infection of wound due to methicillin resistant Staphylococcus aureus (MRSA)   . 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 Right hemiparesis ([place]) 07/08/2015  . Acute left ACA ischemic stroke ([place]) 07/08/2015  . Cerebrovascular accident (CVA) ([place])   . Stroke ([place]) 07/02/2015  . Acute ischemic stroke ([place]) 07/02/2015  . CVA (cerebral infarction) 01/15/2013  . Essential hypertension 01/15/2013  . Nicotine dependence 01/15/2013  . Alcohol abuse 01/15/2013    Expected Discharge Date: Expected Discharge Date: 08/06/15  Team Members Present: Physician leading conference: Dr. Alysia Penna Social Worker Present: Ovidio Kin, LCSW Nurse Present: Dorien Chihuahua, RN PT Present: Other (comment) (Cyndi Wynn-PT) OT Present: Clyda Greener, OT SLP Present: Windell Moulding, SLP PPS Coordinator present : Daiva Nakayama, RN, CRRN     Current Status/Progress Goal Weekly Team Focus  Medical   gluteal abscess hydrotherapy, Right spastic hemi  home d/c off hydrotherapy  Wound care for L glut and R thigh abscess   Bowel/Bladder   Incontinent of bowel and bladder. LBM 2/15, condom cath in place  Timed toileting q 3 hrs PRN, before and after meals, and  bowel program BSC 0615 am.  Continue with timed toileting and encourage patient to call when he has the urge   Swallow/Nutrition/ Hydration             ADL's   Min assist UB bathing and dressing, mod assist for LB bathing with max assist for dressing.  Mod assist for squat pivot transfers to the elevated toilet or tub bench.  Still with increased tone in the RUE with decreased functional use.  Needs mod to max assist to incorporate into selfcare tasks.    min to mod assist  neuromuscular re-eduction, selfcare re-training, balance re-training, pt/family education, therapeutic exercise   Mobility   mod A transfers, min A w/c, max A gait  min A transfers, supervision w/c  balance, transfers, family ed   Communication   Supervision for comprehension, min-mod assist for verbal expression   min assist   verbal expression in phrases and sentences   Safety/Cognition/ Behavioral Observations  Min-mod assist for basice   Min assist for basic, downgraded   awareness, recall of daily information, attention    Pain   No c/o pain  <3  Assess pain and monitor for effectiveness   Skin   Wound (L) buttocks hydrotherapy today, W-D drsg, and (R) thigh W-D drsg  Assess skin q shift and PRN, turn q 2 hrs PRN  Educate family on skin assessment, and encourage patient to initate turning q 2 hrs while in bed      *See Care Plan and progress notes for long and short-term goals.  Barriers to Discharge: Severe neuro deficits  Possible Resolutions to Barriers:  cont rehab and d/c planning    Discharge Planning/Teaching Needs:  Have scheduled sister and girlfirend to come in tomorrow to attend therpaies and begin family education to see if taking pt home is a realistic goal from rehab. Pt needs to be on a bowle program due to his severe incontinence      Team Discussion:  Making progress slowly still much care-mod assist level, at times min assist with transfers. Working on a bowel program for home.  Neuro-psych to see this week. Hydrotherapy dc today-wound looks good. Still has tone even with increased zanaflex. Awareness and attention still limited, tends to push to his good side. Sister and girlfriend coming  tomorrow to begin family education.   Revisions to Treatment Plan:  Family education beginning   Continued Need for Acute Rehabilitation Level of Care: The patient requires daily medical management by a physician with specialized training in physical medicine and rehabilitation for the following conditions: Daily direction of a multidisciplinary physical rehabilitation program to ensure safe treatment while eliciting the highest outcome that is of practical value to the patient.: Yes Daily medical management of patient stability for increased activity during participation in an intensive rehabilitation regime.: Yes Daily analysis of laboratory values and/or radiology reports with any subsequent need for medication adjustment of medical intervention for : Wound care problems;Neurological problems  Easter Ken[place], Gardiner Rhyme 07/30/2015, 1:32 PM

## 2015-07-30 NOTE — Progress Notes (Signed)
Physical Therapy Session Note  Patient Details  Name: Gerald [name] MRN: [id number] Date of Birth: February 07, 1961  Today's Date: 07/30/2015 PT Individual Time: 1015-1100 PT Individual Time Calculation (min): 45 min   Short Term Goals: Week 4:  PT Short Term Goal 1 (Week 4): pt will perform standing balance task consistently with mod A PT Short Term Goal 2 (Week 4): Pt will perform functional transfers consistently with min A  Skilled Therapeutic Interventions/Progress Updates:    Pt received supine in bed with no c/o pain and agreeable to therapy session.  Session focus on bed mobility, transfers, midline orientation in standing and functional use of RUE.  LB dressing with focus on rolling R with supervision and L with min assist to don pants.  Pt transitioned supine>sit using bedrails with mod verbal/visual cues and supervision.  Dynamic balance seated EOB for UB dressing and donning shoes/AFO.  Pt able to don shirt with min assist and mod verbal cues.  Mod assist to don L shoe and total assist to don R shoe with AFO.  Squat/pivot from bed>w/c with mod assist for trunk control and max multimodal cues for correct w/c set up.  Pt propelled w/c to/from therapy gym using hemi-technique with supervision and verbal/visual cues for attention to R side.  Repeated sit<>stand at side of // bars with mirror for visual feedback, mod assist for controlled descent.  Focus in standing on orientation to midline with LUE supported on elevated table to reduce pushing.  Functional use of RUE with facilitation from therapist for retrieving post-its and placing on // bars.  Pt returned to room in w/c in same manner as above and positioned with QRB in place, call bell in reach and needs met.   Therapy Documentation Precautions:  Precautions Precautions: Fall Precaution Comments: right hemiparesis with increased flexor tone Restrictions Weight Bearing Restrictions: No  See Function Navigator for Current Functional  Status.   Therapy/Group: Individual Therapy  [name] 07/30/2015, 12:17 PM

## 2015-07-30 NOTE — Progress Notes (Signed)
Physical Therapy Wound Treatment Patient Details  Name: Gerald [name] MRN: [id number] Date of Birth: 12/19/1960  Today's Date: 07/30/2015 Time: 0846-0903 Time Calculation (min): 17 min  Subjective  Subjective: No c/o's Patient and Family Stated Goals: heal wound Date of Onset: 07/24/15 (first documentation of wound) Prior Treatments: dry gauze  Pain Score:  No c/o's.  Wound Assessment  Wound / Incision (Open or Dehisced) 07/24/15 Other (Comment) Buttocks Left 3x3x2 (Active)  Dressing Type Barrier Film (skin prep);Gauze (Comment);Moist to dry 07/30/2015  9:05 AM  Dressing Changed New 07/30/2015  9:05 AM  Dressing Status Clean;Dry;Intact 07/30/2015  9:05 AM  Dressing Change Frequency Twice a day 07/30/2015  9:05 AM  Site / Wound Assessment Pink;Yellow 07/30/2015  9:05 AM  % Wound base Red or Granulating 98% 07/30/2015  9:05 AM  % Wound base Yellow 2% 07/30/2015  9:05 AM  % Wound base Black 0% 07/30/2015  9:05 AM  % Wound base Other (Comment) 0% 07/30/2015  9:05 AM  Peri-wound Assessment Intact 07/30/2015  9:05 AM  Wound Length (cm) 2.4 cm 07/26/2015  1:19 PM  Wound Width (cm) 3.1 cm 07/26/2015  1:19 PM  Wound Depth (cm) 2 cm 07/26/2015  1:19 PM  Undermining (cm) 1.5 at 10 o'clock 07/26/2015  1:19 PM  Margins Unattached edges (unapproximated) 07/30/2015  9:05 AM  Closure None 07/30/2015  9:05 AM  Drainage Amount Minimal 07/30/2015  9:05 AM  Drainage Description Serosanguineous 07/30/2015  9:05 AM  Non-staged Wound Description Full thickness 07/28/2015 10:50 PM  Treatment Hydrotherapy (Pulse lavage);Packing (Saline gauze) 07/30/2015  9:05 AM      Hydrotherapy Pulsed lavage therapy - wound location: Lt buttock Pulsed Lavage with Suction (psi): 4 psi Pulsed Lavage with Suction - Normal Saline Used: 1000 mL Pulsed Lavage Tip: Tip with splash shield   Wound Assessment and Plan  Wound Therapy - Assess/Plan/Recommendations Wound Therapy - Clinical Statement: Wound base beefy red granulation with  less than 2% slough remaining. No further need for hydrotherapy. Wound Therapy - Functional Problem List: limited sitting tolerance Factors Delaying/Impairing Wound Healing: Infection - systemic/local;Immobility;Multiple medical problems;Substance abuse Hydrotherapy Plan: Debridement;Dressing change;Patient/family education;Pulsatile lavage with suction Wound Therapy - Frequency:  (DC hydrotherapy) Wound Therapy - Follow Up Recommendations: Home health RN Wound Plan: see above  Wound Therapy Goals- Improve the function of patient's integumentary system by progressing the wound(s) through the phases of wound healing (inflammation - proliferation - remodeling) by: Decrease Necrotic Tissue to: 10 Decrease Necrotic Tissue - Progress: Met Increase Granulation Tissue to: 90 Increase Granulation Tissue - Progress: Met Decrease Length/Width/Depth by (cm): -/-/0.3 Decrease Length/Width/Depth - Progress: Partly met Improve Drainage Characteristics: Min;Serous Improve Drainage Characteristics - Progress: Partly met Patient/Family will be able to : verbalize understanding of basic wound healing and hydrotherapy role Patient/Family Instruction Goal - Progress: Met  Goals will be updated until maximal potential achieved or discharge criteria met.  Discharge criteria: when goals achieved, discharge from hospital, MD decision/surgical intervention, no progress towards goals, refusal/missing three consecutive treatments without notification or medical reason.  GP     , 07/30/2015, 9:12 AM   PT [phone]    

## 2015-07-30 NOTE — Progress Notes (Signed)
Speech Language Pathology Weekly Progress and Session Note  Patient Details  Name: Gerald [name] MRN: [id number] Date of Birth: 1960-11-14  Beginning of progress report period: July 23, 2015 End of progress report period: July 30, 2015  Today's Date: 07/30/2015 SLP Individual Time: 1133-1200 SLP Individual Time Calculation (min): 27 min  Short Term Goals: Week 3: SLP Short Term Goal 1 (Week 3): Pt will follow 3-step directives with 75% accuracy and min A verbal cues.  SLP Short Term Goal 1 - Progress (Week 3): Met SLP Short Term Goal 2 (Week 3): Pt will communicate basic needs/wants via phrase level verbal expression with mod  A. SLP Short Term Goal 2 - Progress (Week 3): Met SLP Short Term Goal 3 (Week 3): Patient will self-monitor and correct verbal errors within structured tasks with Mod A verbal and visual cues.  SLP Short Term Goal 3 - Progress (Week 3): Met SLP Short Term Goal 4 (Week 3): Patient will utilize call bell to request assistance in 25% of opportunities with Max A verbal and question cues.  SLP Short Term Goal 4 - Progress (Week 3): Met SLP Short Term Goal 5 (Week 3): Patient will demonstrate selective attention to a funcitonal task in a mildly distracting enviornment for 30 minutes with Min A verbal cues for redirection.  SLP Short Term Goal 5 - Progress (Week 3): Met SLP Short Term Goal 6 (Week 3): Patient will recall/carryover new information in regards to technique for transfers, ADL's etc, with Mod A question and verbal cues.  SLP Short Term Goal 6 - Progress (Week 3): Progressing toward goal    New Short Term Goals: Week 4: SLP Short Term Goal 1 (Week 4): Pt will communicate basic needs/wants via phrase level verbal expression with supervision.  SLP Short Term Goal 2 (Week 4): Patient will self-monitor and correct verbal errors within structured tasks with Min A verbal and visual cues.  SLP Short Term Goal 3 (Week 4): Patient will utilize call bell to  request assistance in 75% of opportunities with Min A verbal and question cues.  SLP Short Term Goal 4 (Week 4): Patient will demonstrate selective attention to a funcitonal task in a mildly distracting enviornment for 30 minutes with supervision. SLP Short Term Goal 5 (Week 4): Patient will recall/carryover new information in regards to technique for transfers, ADL's etc, with Mod A question and verbal cues.   Weekly Progress Updates:  Pt made functional gains this reporting period and has met 5 out of 6 short term goals.  Pt currently requires min assist for basic, functional communication and is verbalizing at the phrase/sentence level.  Pt requires mod assist to recognize and correct verbal errors.  Pt requires mod assist during basic, familiar task due to moderate cognitive impairments characterized by decreased selective attention to tasks and recall of daily information.  Pt would continue to benefit from skilled ST while inpatient in order to maximize functional independence and reduce burden of care prior to discharge.  Pt and family education is ongoing.   Continue to anticipate that pt will need 24/7 supervision at discharge in addition to [name] follow up at next level of care.     Intensity: Minumum of 1-2 x/day, 30 to 90 minutes Frequency: 3 to 5 out of 7 days Duration/Length of Stay: 08/06/15 Treatment/Interventions: Cognitive remediation/compensation;Multimodal communication approach;Speech/Language facilitation;Functional tasks;Therapeutic Activities;Therapeutic Exercise;Patient/family education;Environmental Environmental consultant;Internal/external aids   Daily Session  Skilled Therapeutic Interventions: Pt was seen for skilled ST targeting communication goals and  ongoing family education.  Pt's sister, Gerald [name] was present.  SLP provided skilled strategy instruction regarding techniques to maximize pt's functional communication for conveying needs and wants to caregivers.  Pt's sister  verbalized understanding of education.  Pt was 100% accurate for word finding during a basic categorical naming task with overall min assist verbal cues from SLP and sister.  Pt left in wheelchair with call bell within reach and family at bedside.        Function:   Eating Eating       Cognition Comprehension Comprehension assist level: Understands basic 75 - 89% of the time/ requires cueing 10 - 24% of the time  Expression   Expression assist level: Expresses basic 75 - 89% of the time/requires cueing 10 - 24% of the time. Needs helper to occlude trach/needs to repeat words.  Social Interaction Social Interaction assist level: Interacts appropriately 75 - 89% of the time - Needs redirection for appropriate language or to initiate interaction.  Problem Solving Problem solving assist level: Solves basic 25 - 49% of the time - needs direction more than half the time to initiate, plan or complete simple activities  Memory Memory assist level: Recognizes or recalls 50 - 74% of the time/requires cueing 25 - 49% of the time   General    Pain Pain Assessment Pain Assessment: No/denies pain  Therapy/Group: Individual Therapy  Gerald [name], Gerald [name] 07/30/2015, 12:57 PM

## 2015-07-30 NOTE — Progress Notes (Signed)
Occupational Therapy Session Note  Patient Details  Name: Gerald [name] MRN: [id number] Date of Birth: August 28, 1960  Today's Date: 07/30/2015 OT Individual Time: 1300-1415 OT Individual Time Calculation (min): 75 min    Short Term Goals: Week 3:  OT Short Term Goal 1 (Week 3): Pt will maintain dynamic sitting balance during bathing tasks with mod assist. OT Short Term Goal 2 (Week 3): Pt will complete UB dressing in supported sitting with supervision. OT Short Term Goal 3 (Week 3): Pt will complete LB dressing with mod assist sit to stand. OT Short Term Goal 4 (Week 3): Pt will use the RUE as a stabilizer with mod assist during grooming and bathing tasks.   OT Short Term Goal 5 (Week 3): Pt will perform toilet transfer to the [facility] with mod assist stand pivot.  Skilled Therapeutic Interventions/Progress Updates:    Pt began session working on toileting as he had been working on timed toileting with nursing.  Used Steady for sit to stand with mod assist for standing balance in order for therapist to complete toilet hygiene and clothing management.  Once finished, he was able to roll down to the gym and transfer to the therapy mat with mod assist squat pivot.  Once on the mat focused session on standing balance while incorporating reaching task.  Increased LOB to the right in standing, requiring max assist to maintain balance with max facilitation at the right knee as well.  Pt also demonstrating posterior lean in standing.  Needed sitting rest breaks after 2-3 mins of standing.  Last part of session focused on RUE weightbearing and activation.  Performed gentle scapular adduction mobilizations to the right scapula and stretches to the right elbow.  Had pt work on lifting the LLE off of the floor while having to maintain sitting balance using his trunk and RUE.  Mod facilitation needed to complete multiple times.  Noted pt with significant motor planning deficits when attempting this.  Once finished,  returned to room via wheelchair with pt left sitting in wheelchair with safety belt and call button within reach.    Therapy Documentation Precautions:  Precautions Precautions: Fall Precaution Comments: right hemiparesis with increased flexor tone Restrictions Weight Bearing Restrictions: No  Pain: Pain Assessment Pain Assessment: No/denies pain ADL: See Function Navigator for Current Functional Status.   Therapy/Group: Individual Therapy  Judyth Demarais OTR/L 07/30/2015, 3:39 PM

## 2015-07-31 ENCOUNTER — Inpatient Hospital Stay (HOSPITAL_COMMUNITY): Payer: Medicaid Other | Admitting: Speech Pathology

## 2015-07-31 ENCOUNTER — Inpatient Hospital Stay (HOSPITAL_COMMUNITY): Payer: Medicaid Other | Admitting: Occupational Therapy

## 2015-07-31 ENCOUNTER — Ambulatory Visit (HOSPITAL_COMMUNITY): Payer: Self-pay | Admitting: Physical Therapy

## 2015-07-31 ENCOUNTER — Encounter (HOSPITAL_COMMUNITY): Payer: Self-pay

## 2015-07-31 MED ORDER — BACLOFEN 5 MG HALF TABLET
5.0000 mg | ORAL_TABLET | Freq: Two times a day (BID) | ORAL | Status: DC
  Administered 2015-07-31 (×2): 5 mg via ORAL
  Filled 2015-07-31 (×3): qty 1

## 2015-07-31 NOTE — Progress Notes (Signed)
Orthopedic Tech Progress Note Patient Details:  Gerald [name] [birth year] [id number]  Patient ID: Gerald [name], male   DOB: [birth year], 55 y.o.   MRN: [id number]   Gerald [name] 07/31/2015, 10:05 AMCalled [employer] for right AFO.

## 2015-07-31 NOTE — Progress Notes (Signed)
Occupational Therapy Session Note  Patient Details  Name: Gerald [name] MRN: [id number] Date of Birth: [birth year]  Today's Date: 07/31/2015 OT Individual Time: 1005-1104 OT Individual Time Calculation (min): 59 min    Short Term Goals: Week 4:  OT Short Term Goal 1 (Week 4): Continue working toward established [place].  Skilled Therapeutic Interventions/Progress Updates:    Family education with pt's girlfriend on bathing and dressing sit to stand at the sink.  Pt's significant other was able to assist with sit to stand transitions for washing LB and for pulling pants over hips with mod instructional cueing from therapist.  Mod assist needed for pt to complete this.  Utilized wash mit with mod hand over hand assist to wash the left arm, part of his chest and abdomen, as well as the upper right leg.  He was able to complete the rest with supervision using the LUE with washcloth.  Max assist for crossing the RLE over the left for washing the foot and donning all clothing.  Pt left in wheelchair at end of session with safety belt in place and arm trough as well.  Will continue family education during PM treatment.   Therapy Documentation Precautions:  Precautions Precautions: Fall Precaution Comments: right hemiparesis with increased flexor tone Restrictions Weight Bearing Restrictions: No  Pain: Pain Assessment Pain Assessment: No/denies pain ADL: See Function Navigator for Current Functional Status.   Therapy/Group: Individual Therapy  Sebastin Perlmutter OTR/L 07/31/2015, 12:47 PM

## 2015-07-31 NOTE — Progress Notes (Signed)
Patient is currently on a bowel and bladder program. Bowel program [phone] am-getting patient up to bedside commode for emptying. Bladder program timed toileting every 3 hours and before and after meals using the urinal. Reinforced education of the importance of being continent for the health of skin, and healing wound.[place]

## 2015-07-31 NOTE — Progress Notes (Signed)
Speech Language Pathology Daily Session Note  Patient Details  Name: Gerald [name] MRN: [id number] Date of Birth: 05-21-1961  Today's Date: 07/31/2015 SLP Individual Time: 1133-1203 SLP Individual Time Calculation (min): 30 min  Short Term Goals: Week 4: SLP Short Term Goal 1 (Week 4): Pt will communicate basic needs/wants via phrase level verbal expression with supervision.  SLP Short Term Goal 2 (Week 4): Patient will self-monitor and correct verbal errors within structured tasks with Min A verbal and visual cues.  SLP Short Term Goal 3 (Week 4): Patient will utilize call bell to request assistance in 75% of opportunities with Min A verbal and question cues.  SLP Short Term Goal 4 (Week 4): Patient will demonstrate selective attention to a funcitonal task in a mildly distracting enviornment for 30 minutes with supervision. SLP Short Term Goal 5 (Week 4): Patient will recall/carryover new information in regards to technique for transfers, ADL's etc, with Mod A question and verbal cues.   Skilled Therapeutic Interventions:  Pt was seen for skilled ST targeting family education. [name] and sister were present during today's therapy session.  SLP provided skilled strategy instruction for communication strategies for maximizing pt's communication for conveying needs and wants to caregivers.  A handout was provided of Supported Conversation Techniques to facilitate carryover of strategies in the home environment.  SLP also discussed cognitive deficits and strategies for distraction management and recall of daily information.  Pt's sister and girlfriend were aware of SLP recommendation that pt have 24/7 supervision and assistance for medication and financial management at discharge in addition to [place] follow up at next level of care.  All questions were answered to their satisfaction at this time. Pt was left in wheelchair with family at bedside, call bell left within reach.  Continue per current  plan of care.    Function:  Eating Eating             Cognition Comprehension Comprehension assist level: Understands basic 75 - 89% of the time/ requires cueing 10 - 24% of the time  Expression   Expression assist level: Expresses basic 50 - 74% of the time/requires cueing 25 - 49% of the time. Needs to repeat parts of sentences.  Social Interaction Social Interaction assist level: Interacts appropriately 75 - 89% of the time - Needs redirection for appropriate language or to initiate interaction.  Problem Solving Problem solving assist level: Solves basic 25 - 49% of the time - needs direction more than half the time to initiate, plan or complete simple activities  Memory Memory assist level: Recognizes or recalls 50 - 74% of the time/requires cueing 25 - 49% of the time    Pain Pain Assessment Pain Assessment: No/denies pain  Therapy/Group: Individual Therapy  Shaarav Ripple, [name] 07/31/2015, 12:37 PM

## 2015-07-31 NOTE — Progress Notes (Signed)
Social Work Patient ID: Gerald [name], male   DOB: 1960/10/29, 55 y.o.   MRN: [id number] Met with pt, [name]-sister and [name]-Girlfriend who attended all of his therapies and did hands on care. [name] plans to be here Monday and Tuesday so feels prepared for discharge Wed. [name] reports they will work together, Architect will be with pt while [name] works. Both are committed to providing 24 hr care to pt. Discussed home health therapies, equipment and getting pt connected to the [facility] and [facility] for follow up and assist with medications.  All were very appreciative and want pt to recover as much as possible from this stroke. Will continue to work toward discharge next Wed 2/22. All in agreement with team conference and recommendations.

## 2015-07-31 NOTE — Plan of Care (Signed)
Problem: RH BLADDER ELIMINATION Goal: RH STG MANAGE BLADDER WITH ASSISTANCE STG Manage Bladder With Assistance. Max A  Timed toileting q 3 hrs and before and after meals

## 2015-07-31 NOTE — Progress Notes (Signed)
  Subjective: Pt without complaint  Objective: Vital signs in last 24 hours: Temp:  [97.6 F (36.4 C)-98.6 F (37 C)] 97.6 F (36.4 C) (02/16 0542) Pulse Rate:  [94] 94 (02/15 1548) Resp:  [17-18] 18 (02/16 0542) BP: (125-138)/(76-78) 125/76 mmHg (02/15 1548) SpO2:  [100 %] 100 % (02/16 0542) Last BM Date: 07/30/15  Intake/Output from previous day: 02/15 0701 - 02/16 0700 In: 600 [P.O.:600] Out: 1000 [Urine:1000] Intake/Output this shift:    Incision/Wound:wounds to left buttock and right leg clean   Lab Results:  No results for input(s): WBC, HGB, HCT, PLT in the last 72 hours. BMET  Recent Labs  07/29/15 0537  CREATININE 1.38*   PT/INR No results for input(s): LABPROT, INR in the last 72 hours. ABG No results for input(s): PHART, HCO3 in the last 72 hours.  Invalid input(s): PCO2, PO2  Studies/Results: No results found.  Anti-infectives: Anti-infectives    Start     Dose/Rate Route Frequency Ordered Stop   07/27/15 1100  sulfamethoxazole-trimethoprim (BACTRIM DS,SEPTRA DS) 800-160 MG per tablet 1 tablet     1 tablet Oral Every 12 hours 07/27/15 1017 08/06/15 0759   07/23/15 0800  doxycycline (VIBRA-TABS) tablet 100 mg  Status:  Discontinued     100 mg Oral Every 12 hours 07/23/15 0748 07/24/15 1525      Assessment/Plan: Wounds are clean No more hydrotherapy Needs wet to dry dressing changes as outpatient     LOS: 23 days    Shawniece Oyola A. 07/31/2015

## 2015-07-31 NOTE — Progress Notes (Signed)
Physical Therapy Note  Patient Details  Name: Gerald [name] MRN: [id number] Date of Birth: 08/03/1960 Today's Date: 07/31/2015    Time: [phone] 60 minutes  1:1 Pt with no c/o pain.  Session focused on family ed with pt's sister [name] and girlfriend [name].  PT demo'd and instructed and family performed bed <> w/c transfers squat pivot at min A level, bed mobility at min A level, car transfers at mod A level. Pt's sister required PT assistance for safety with car transfer, needs further education. Pt performed w/c mobility with supervision 150'.  Pt's family educated on AFO wear only during day, safety with positioning pt in w/c and bed, energy conservation.  Pt's sister plans to attend more PT sessions for continued education on transfer training.   [name] 07/31/2015, 9:59 AM

## 2015-07-31 NOTE — Progress Notes (Signed)
Occupational Therapy Session Note  Patient Details  Name: Gerald [name] MRN: [id number] Date of Birth: [birth year]  Today's Date: 07/31/2015 OT Individual Time: 1405-1500 OT Individual Time Calculation (min): 55 min    Skilled Therapeutic Interventions/Progress Updates:    Worked on further family education with family during session.  Had his significant other assist with toilet transfers and toileting during session with sister just observing.  He was able to complete the transfers squat pivot with mod assist.  Mod instructional cueing needed for sequencing transfer including wheelchair setup and foot placement.  Utilized gait belt for sit to stand and for transfers.  Also practiced walk-in shower transfers with use of tub bench as well.  Family will discuss whether or not they want to get one before he leaves.  Instructed them to purchase suction cup grab bar for use in the shower as well.  Pt returned to room at end of session and left in wheelchair with pt's significant other and his sister present at end of session.    Therapy Documentation Precautions:  Precautions Precautions: Fall Precaution Comments: right hemiparesis with increased flexor tone Restrictions Weight Bearing Restrictions: No  Pain: Pain Assessment Pain Assessment: No/denies pain ADL: See Function Navigator for Current Functional Status.   Therapy/Group: Individual Therapy  Layney Gillson OTR/L 07/31/2015, 4:01 PM

## 2015-07-31 NOTE — Progress Notes (Signed)
Subjective/Complaints: No issues overnite per pt  ROS- limited by cognition and speech, but appears to deny CP, SOB, N/V/D  Objective: Vital Signs: Blood pressure 125/76, pulse 94, temperature 97.6 F (36.4 C), temperature source Oral, resp. rate 18, height 6' (1.829 m), weight 78.926 kg (174 lb), SpO2 100 %. No results found. Results for orders placed or performed during the hospital encounter of 07/08/15 (from the past 72 hour(s))  Creatinine, serum     Status: Abnormal   Collection Time: 07/29/15  5:37 AM  Result Value Ref Range   Creatinine, Ser 1.38 (H) 0.61 - 1.24 mg/dL   GFR calc non Af Amer 57 (L) >60 mL/min   GFR calc Af Amer >60 >60 mL/min    Comment: (NOTE) The eGFR has been calculated using the CKD EPI equation. This calculation has not been validated in all clinical situations. eGFR's persistently <60 mL/min signify possible Chronic Kidney Disease.      HEENT: Normocephalic, atraumatic. poor dentition.  Cardio: RRR and no murmur Resp: Unlabored breathing, bilaterally clear to auscultation. GI: BS positive and NT, ND Skin:  No new lesions on visible skin. Warm and dry. Neuro: Alert.  Motor: RUE: 2-/5 elbow extension, 3/5 grip , grasp reflex RLE: 0/5 proximal distal  Expressive aphasia Dysarthric speech Musc/Skel:  No tenderness. No edema. Gen: NAD vital signs reviewed. Psych: Flat affect. normal behavior.  Assessment/Plan: 1. Functional deficits secondary to Left ACA infarct with Right HP which require 3+ hours per day of interdisciplinary therapy in a comprehensive inpatient rehab setting. Physiatrist is providing close team supervision and 24 hour management of active medical problems listed below. Physiatrist and rehab team continue to assess barriers to discharge/monitor patient progress toward functional and medical goals. FIM: Function - Bathing Position: Wheelchair/chair at sink Body parts bathed by patient: Right arm, Left arm, Chest, Abdomen,  Front perineal area, Right upper leg, Left upper leg Body parts bathed by helper: Back, Left lower leg, Right lower leg, Buttocks Assist Level: Touching or steadying assistance(Pt > 75%)  Function- Upper Body Dressing/Undressing What is the patient wearing?: Pull over shirt/dress Pull over shirt/dress - Perfomed by patient: Thread/unthread left sleeve, Put head through opening, Pull shirt over trunk, Thread/unthread right sleeve Pull over shirt/dress - Perfomed by helper: Thread/unthread right sleeve, Pull shirt over trunk Assist Level: Touching or steadying assistance(Pt > 75%) Function - Lower Body Dressing/Undressing What is the patient wearing?: Pants, Shoes, AFO Position: Other (comment) (bed for pants, EOB for shoes/AFO) Underwear - Performed by patient: Thread/unthread left underwear leg Underwear - Performed by helper: Thread/unthread right underwear leg, Pull underwear up/down Pants- Performed by patient: Thread/unthread left pants leg, Pull pants up/down Pants- Performed by helper: Thread/unthread right pants leg Non-skid slipper socks- Performed by patient: Don/doff left sock Non-skid slipper socks- Performed by helper: Don/doff right sock Socks - Performed by patient: Don/doff right sock, Don/doff left sock Socks - Performed by helper: Don/doff right sock, Don/doff left sock Shoes - Performed by patient: Don/doff left shoe, Fasten left Shoes - Performed by helper: Don/doff right shoe, Fasten right AFO - Performed by helper: Don/doff right AFO TED Hose - Performed by patient: Don/doff right TED hose, Don/doff left TED hose TED Hose - Performed by helper: Don/doff right TED hose, Don/doff left TED hose Assist for footwear: Partial/moderate assist Assist for lower body dressing: Touching or steadying assistance (Pt > 75%)  Function - Toileting Toileting activity did not occur: No continent bowel/bladder event Toileting steps completed by patient: Adjust clothing prior  to  toileting Toileting steps completed by helper: Adjust clothing prior to toileting, Performs perineal hygiene, Adjust clothing after toileting Toileting Assistive Devices: Grab bar or rail Assist level: Two helpers  Function - Air cabin crew transfer activity did not occur: Safety/medical concerns Toilet transfer assistive device: Elevated toilet seat/BSC over toilet Assist level to toilet: Moderate assist (Pt 50 - 74%/lift or lower) Assist level from toilet: Maximal assist (Pt 25 - 49%/lift and lower) Assist level to bedside commode (at bedside): 2 helpers Assist level from bedside commode (at bedside): 2 helpers  Function - Chair/bed transfer Chair/bed transfer method: Squat pivot Chair/bed transfer assist level: Moderate assist (Pt 50 - 74%/lift or lower) Chair/bed transfer assistive device: Armrests Mechanical lift: Stedy (LUE supinated to decrease pushing) Chair/bed transfer details: Verbal cues for technique, Verbal cues for sequencing, Manual facilitation for weight shifting, Tactile cues for weight shifting, Manual facilitation for placement   Function - Locomotion: Wheelchair Will patient use wheelchair at discharge?: Yes Type: Manual Max wheelchair distance: 150 Assist Level: Supervision or verbal cues Wheel 50 feet with 2 turns activity did not occur: Safety/medical concerns (fatigue) Assist Level: Supervision or verbal cues Wheel 150 feet activity did not occur: Safety/medical concerns (fatigue) Assist Level: Supervision or verbal cues Turns around,maneuvers to table,bed, and toilet,negotiates 3% grade,maneuvers on rugs and over doorsills: No Function - Locomotion: Ambulation Ambulation activity did not occur: Safety/medical concerns (lack of sensation) Assistive device: Rail in hallway, Orthosis Max distance: 15 Assist level: Total assist (Pt < 25%) Walk 10 feet activity did not occur: Safety/medical concerns Assist level: Total assist (Pt < 25%) Walk 50 feet  with 2 turns activity did not occur: Safety/medical concerns Walk 150 feet activity did not occur: Safety/medical concerns Walk 10 feet on uneven surfaces activity did not occur: Safety/medical concerns  Function - Comprehension Comprehension: Auditory Comprehension assist level: Understands basic 75 - 89% of the time/ requires cueing 10 - 24% of the time  Function - Expression Expression: Verbal Expression assist level: Expresses basic 50 - 74% of the time/requires cueing 25 - 49% of the time. Needs to repeat parts of sentences.  Function - Social Interaction Social Interaction assist level: Interacts appropriately 75 - 89% of the time - Needs redirection for appropriate language or to initiate interaction.  Function - Problem Solving Problem solving assist level: Solves basic 25 - 49% of the time - needs direction more than half the time to initiate, plan or complete simple activities  Function - Memory Memory assist level: Recognizes or recalls 50 - 74% of the time/requires cueing 25 - 49% of the time Patient normally able to recall (first 3 days only): Current season  Medical Problem List and Plan: 1.  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  DVT Prophylaxis/Anticoagulation: Subcutaneous Lovenox. Monitor platelet counts and any signs of bleeding, plt 436 on 2/10 3. Pain Management: Tylenol as needed. Pain controlled at present 4. Hypertension. Norvasc 5 mg daily, lisinopril 20 mg daily. 125/76 Monitor with increased mobility, cont current meds 5. Neuropsych: This patient is capable of making decisions on his own behalf. 6. Skin/Wound Care: Routine skin checks. OOB. Encourage appropriate nutrition 7. Fluids/Electrolytes/Nutrition: Routine I&O's   Eating 100% of his meals8. Hyperlipidemia. Lipitor 9. Tobacco/ cocaine/alcohol abuse. Provide counseling during rehab stay- cognitive  deficits will limit effectiveness of compliance 10.  Hypoalbuminemia- mild  11.  Spasticity R UE and LE still increased on Zanaflex,increased to 53m QID  monitor BP, added resting hand splint, will add baclofen 12.  Right thigh  as well as Left glut abscess, s/p I and D Hydrotherapy d/ced  no IV abx unless pt febrile or with increasing pain (WBC trending down on 2/10)  Pt started on PO BactrimDS will d/c on 2/22  LOS (Days) 23 A FACE TO FACE EVALUATION WAS PERFORMED  Roverto Bodmer E 07/31/2015, 7:45 AM

## 2015-08-01 ENCOUNTER — Inpatient Hospital Stay (HOSPITAL_COMMUNITY): Payer: Medicaid Other | Admitting: Occupational Therapy

## 2015-08-01 ENCOUNTER — Inpatient Hospital Stay (HOSPITAL_COMMUNITY): Payer: Self-pay | Admitting: Physical Therapy

## 2015-08-01 ENCOUNTER — Inpatient Hospital Stay (HOSPITAL_COMMUNITY): Payer: Medicaid Other | Admitting: Speech Pathology

## 2015-08-01 MED ORDER — BACLOFEN 5 MG HALF TABLET
5.0000 mg | ORAL_TABLET | Freq: Three times a day (TID) | ORAL | Status: DC
Start: 2015-08-01 — End: 2015-08-04
  Administered 2015-08-01 – 2015-08-03 (×8): 5 mg via ORAL
  Filled 2015-08-01 (×8): qty 1

## 2015-08-01 NOTE — Progress Notes (Signed)
Occupational Therapy Session Note  Patient Details  Name: DANFORD WORTHINGTON MRN: [id number] Date of Birth: 05-Jan-1961  Today's Date: 08/01/2015 OT Individual Time: [id number] Individual Time Calculation (min): 42 min    Skilled Therapeutic Interventions/Progress Updates:    Pt worked on Clinical research associate during session.  Transferred from wheelchair to [facility] with mod assist squat pivot.  Transition to supine with mod assist.  Worked on initiation and completion of rolling to the left side with activation of abdominals.  Initially pt needing max assist to complete but with practice he progressed to supervision once RLE was flexed at the knee.  Positioned pt in left sidelying for scapular mobilizations on the right shoulder.  Transferred back to supine for use of dowel rod and BUE shoulder flexion and tricep extension exercises.  Pt still with increased flexor tone but did demonstrate some activation of elbow extensors in the RUE.  Applied NMES to the right elbow extensors as well.  With time on 5 seconds and time off at 10 seconds.  Pulse width 150 with 300 pps.  Intensity set on level 7 with duration of stimulation for 10 mins.  Pt still needing min facilitation to achieve elbow extension during shoulder flexion to 90 degrees from supine position.  Pt with no adverse reactions to estim during session.  Transferred back to the room in wheelchair.  Pt left with call button and phone within reach.  Safety belt and arm trough in place as well.    Therapy Documentation Precautions:  Precautions Precautions: Fall Precaution Comments: right hemiparesis with increased flexor tone Restrictions Weight Bearing Restrictions: No  Pain: Pain Assessment Pain Assessment: No/denies pain ADL: See Function Navigator for Current Functional Status.   Therapy/Group: Individual Therapy  Phares Zaccone OTR/L 08/01/2015, 5:17 PM

## 2015-08-01 NOTE — Progress Notes (Signed)
Occupational Therapy Session Note  Patient Details  Name: JONUS RIVENBARK MRN: [id number] Date of Birth: [birth year]  Today's Date: 08/01/2015 OT Individual Time: 0800-0902 OT Individual Time Calculation (min): 62 min    Short Term Goals: Week 4:  OT Short Term Goal 1 (Week 4): Continue working toward established [place].  Skilled Therapeutic Interventions/Progress Updates:   Pt completed bathing and dressing sit to stand at the sink.  Mod assist for dynamic sitting balance overall.  Pt still forward LOB as well as to the right when attempting to dress the LLE by bringing it into flexion or when crossing the RLE over the left knee.  Mod assist with min instructional cueing for hand placement with sit to stand for donning brief and pants over hips.  Mr. Silverstone continues to need mod questioning cueing for sequencing of hemidressing techniques as well.  Still with expressive difficulties, usually limiting verbal output to 1-2 word phrases such as "Oh okay!"  Noted slightly better RUE involvement when washing with wash mit and mod assist.  He was able to initiate more movement at the shoulder and elbow extensors with washing of the left arm, chest, and some parts of the LEs.  Still with motor planning deficits with difficulty letting go of objects with the RUE however and increased tone noted in the pectoral and elbow flexors.  Pt left in wheelchair at end of session with call button in reach, safety belt in place.    Therapy Documentation Precautions:  Precautions Precautions: Fall Precaution Comments: right hemiparesis with increased flexor tone Restrictions Weight Bearing Restrictions: No  Pain: Pain Assessment Pain Assessment: No/denies pain ADL: See Function Navigator for Current Functional Status.   Therapy/Group: Individual Therapy  Samaria Anes OTR/L 08/01/2015, 11:45 AM

## 2015-08-01 NOTE — Progress Notes (Signed)
Physical Therapy Session Note  Patient Details  Name: Gerald [name] MRN: [id number] Date of Birth: 1960/08/05  Today's Date: 08/01/2015 PT Individual Time: 1309-1405 PT Individual Time Calculation (min): 56 min   Short Term Goals: Week 2:  PT Short Term Goal 1 (Week 2): pt will perform functional transfers with max A PT Short Term Goal 1 - Progress (Week 2): Met PT Short Term Goal 2 (Week 2): Pt will perform sit to stand with max A PT Short Term Goal 2 - Progress (Week 2): Met PT Short Term Goal 3 (Week 2): pt will perform bed mobility with mod A PT Short Term Goal 3 - Progress (Week 2): Progressing toward goal  Skilled Therapeutic Interventions/Progress Updates:   Pt received in w/c; pt set up at sink and performed hand hygiene with min A and cues for use of RUE at sink.  Transported to hallway outside gym to participate in orthotics clinic/assessment with P&O.  Pt performed gait/assessment x 25' with LUE support on wall rail and blue rocker on RLE and then again 25' with [place] Reaction pre-tibial AFO on RLE with max A of one person with therapist providing verbal cues and facilitation for upright trunk. L trunk elongation and weight shift to L to allow R hip and knee flexion activation to advance RLE; pt required assistance to advance RLE 80% of the time.  Per PT and P&O pt will likely benefit from use of [place] Reaction AFO for transfers and gait training with PT due to placement of medial pre-tibial upright to counteract supination/inversion tone as well as the Reaction AFO is more flexible and reacts with some flexion assistance.  Pt will also benefit from addition of leather toe cap to R shoe to prevent foot from sticking to ground during gait.  Pt agreeable to decision.   P&O to return with patient's shoe and AFO by end of day today or early tomorrow. Finished session with NMR in standing; see below.  Pt returned to room and left in w/c with all items within reach.  Therapy  Documentation Precautions:  Precautions Precautions: Fall Precaution Comments: right hemiparesis with increased flexor tone Restrictions Weight Bearing Restrictions: No Pain: Pain Assessment Pain Assessment: No/denies pain Other Treatments: Treatments Neuromuscular Facilitation: Right;Left;Lower Extremity;Activity to increase coordination;Activity to increase motor control;Activity to increase timing and sequencing;Activity to increase grading;Activity to increase sustained activation;Activity to increase lateral weight shifting;Activity to increase anterior-posterior weight shifting in standing with bilat UE support on therapists with focus on active L lateral weight shift initiated at pelvis and trunk and active hip and knee flexion to tap foot to 2" step to facilitate advancement during gait.  Required +2 assistance for activity for balance, postural control and trunk control/activation.   See Function Navigator for Current Functional Status.   Therapy/Group: Individual Therapy  [name] [facility] 08/01/2015, 2:20 PM

## 2015-08-01 NOTE — Progress Notes (Signed)
Speech Language Pathology Daily Session Note  Patient Details  Name: WILKINS MANCE MRN: [id number] Date of Birth: February 10, 1961  Today's Date: 08/01/2015 SLP Individual Time: 1002-1100 SLP Individual Time Calculation (min): 58 min  Short Term Goals: Week 4: SLP Short Term Goal 1 (Week 4): Pt will communicate basic needs/wants via phrase level verbal expression with supervision.  SLP Short Term Goal 2 (Week 4): Patient will self-monitor and correct verbal errors within structured tasks with Min A verbal and visual cues.  SLP Short Term Goal 3 (Week 4): Patient will utilize call bell to request assistance in 75% of opportunities with Min A verbal and question cues.  SLP Short Term Goal 4 (Week 4): Patient will demonstrate selective attention to a funcitonal task in a mildly distracting enviornment for 30 minutes with supervision. SLP Short Term Goal 5 (Week 4): Patient will recall/carryover new information in regards to technique for transfers, ADL's etc, with Mod A question and verbal cues.   Skilled Therapeutic Interventions:  Pt was seen for skilled ST targeting cognitive goals.  SLP facilitated the session with a card game targeting selective attention to task and recall of new information.  Pt was able to selectively attend to task in a mildly distracting environment for 30 minutes with supervision cues for redirection to task.  Pt initially required max assist verbal cues to recall task protocols and procedures; however, with the use of a written visual aid, SLP was able to fade cues to min assist verbal cues.  Pt required overall mod faded to min assist verbal cues to plan and execute a problem solving strategy during the abovementioned task.  SLP also facilitated the session with a basic money management task to continue to address cognitive goals.  Pt was able to count money, make change, and generate values of coins when named with overall min assist verbal cues to recognize and correct  errors.  Pt was returned to room and left with call bell within reach and quick release belt donned for safety.  Continue per current plan of care.    Function:  Eating Eating   Modified Consistency Diet: No Eating Assist Level: Set up assist for   Eating Set Up Assist For: Cutting food;Opening containers       Cognition Comprehension Comprehension assist level: Understands basic 75 - 89% of the time/ requires cueing 10 - 24% of the time  Expression   Expression assist level: Expresses basic 50 - 74% of the time/requires cueing 25 - 49% of the time. Needs to repeat parts of sentences.  Social Interaction Social Interaction assist level: Interacts appropriately 75 - 89% of the time - Needs redirection for appropriate language or to initiate interaction.  Problem Solving Problem solving assist level: Solves basic 50 - 74% of the time/requires cueing 25 - 49% of the time  Memory Memory assist level: Recognizes or recalls 50 - 74% of the time/requires cueing 25 - 49% of the time    Pain Pain Assessment Pain Assessment: No/denies pain  Therapy/Group: Individual Therapy  Chany Woolworth, [name] 08/01/2015, 12:46 PM

## 2015-08-01 NOTE — Progress Notes (Signed)
Subjective/Complaints: Nursing notes the patient had a small BM he called afterwards. Remains incontinent of bladder and is using a condom cath. Discussed with patient the need for bladder training.  ROS- limited by cognition and speech, but appears to deny CP, SOB, N/V/D  Objective: Vital Signs: Blood pressure 128/76, pulse 80, temperature 98.7 F (37.1 C), temperature source Oral, resp. rate 18, height 6' (1.829 m), weight 78.926 kg (174 lb), SpO2 99 %. No results found. No results found for this or any previous visit (from the past 72 hour(s)).   HEENT: Normocephalic, atraumatic. poor dentition.  Cardio: RRR and no murmur Resp: Unlabored breathing, bilaterally clear to auscultation. GI: BS positive and NT, ND Skin:  No new lesions on visible skin. Warm and dry. Neuro: Alert.  Motor: RUE: 2-/5 elbow extension, 3/5 grip , grasp reflex RLE: 0/5 proximal distal  Expressive aphasia Dysarthric speech Musc/Skel:  No tenderness. No edema. Gen: NAD vital signs reviewed. Psych: Flat affect. normal behavior.  Assessment/Plan: 1. Functional deficits secondary to Left ACA infarct with Right HP which require 3+ hours per day of interdisciplinary therapy in a comprehensive inpatient rehab setting. Physiatrist is providing close team supervision and 24 hour management of active medical problems listed below. Physiatrist and rehab team continue to assess barriers to discharge/monitor patient progress toward functional and medical goals. FIM: Function - Bathing Position: Wheelchair/chair at sink Body parts bathed by patient: Right arm, Left arm, Chest, Abdomen, Front perineal area, Right upper leg, Left upper leg, Left lower leg Body parts bathed by helper: Back, Right lower leg, Buttocks Assist Level: Touching or steadying assistance(Pt > 75%)  Function- Upper Body Dressing/Undressing What is the patient wearing?: Pull over shirt/dress Pull over shirt/dress - Perfomed by patient:  Thread/unthread left sleeve, Put head through opening, Pull shirt over trunk Pull over shirt/dress - Perfomed by helper: Thread/unthread right sleeve Assist Level: Touching or steadying assistance(Pt > 75%) Function - Lower Body Dressing/Undressing What is the patient wearing?: Pants, Shoes, AFO Position: Other (comment) (bed for pants, EOB for shoes/AFO) Underwear - Performed by patient: Thread/unthread left underwear leg Underwear - Performed by helper: Thread/unthread right underwear leg, Pull underwear up/down Pants- Performed by patient: Thread/unthread left pants leg Pants- Performed by helper: Thread/unthread right pants leg, Pull pants up/down Non-skid slipper socks- Performed by patient: Don/doff left sock Non-skid slipper socks- Performed by helper: Don/doff right sock Socks - Performed by patient: Don/doff right sock, Don/doff left sock Socks - Performed by helper: Don/doff right sock, Don/doff left sock Shoes - Performed by patient: Fasten left, Don/doff left shoe Shoes - Performed by helper: Don/doff right shoe, Fasten right AFO - Performed by helper: Don/doff right AFO TED Hose - Performed by patient: Don/doff right TED hose, Don/doff left TED hose TED Hose - Performed by helper: Don/doff right TED hose, Don/doff left TED hose Assist for footwear: Partial/moderate assist Assist for lower body dressing: Touching or steadying assistance (Pt > 75%)  Function - Toileting Toileting activity did not occur: No continent bowel/bladder event Toileting steps completed by patient: Adjust clothing prior to toileting Toileting steps completed by helper: Adjust clothing prior to toileting, Performs perineal hygiene, Adjust clothing after toileting Toileting Assistive Devices: Grab bar or rail Assist level: Two helpers  Function - Air cabin crew transfer activity did not occur: Safety/medical concerns Toilet transfer assistive device: Elevated toilet seat/BSC over  toilet Assist level to toilet: Moderate assist (Pt 50 - 74%/lift or lower) Assist level from toilet: Maximal assist (Pt 25 - 49%/lift  and lower) Assist level to bedside commode (at bedside): 2 helpers Assist level from bedside commode (at bedside): 2 helpers  Function - Chair/bed transfer Chair/bed transfer method: Squat pivot Chair/bed transfer assist level: Moderate assist (Pt 50 - 74%/lift or lower) Chair/bed transfer assistive device: Armrests Mechanical lift: Stedy (LUE supinated to decrease pushing) Chair/bed transfer details: Verbal cues for technique, Verbal cues for sequencing, Manual facilitation for weight shifting, Tactile cues for weight shifting, Manual facilitation for placement   Function - Locomotion: Wheelchair Will patient use wheelchair at discharge?: Yes Type: Manual Max wheelchair distance: 150 Assist Level: Supervision or verbal cues Wheel 50 feet with 2 turns activity did not occur: Safety/medical concerns (fatigue) Assist Level: Supervision or verbal cues Wheel 150 feet activity did not occur: Safety/medical concerns (fatigue) Assist Level: Supervision or verbal cues Turns around,maneuvers to table,bed, and toilet,negotiates 3% grade,maneuvers on rugs and over doorsills: No Function - Locomotion: Ambulation Ambulation activity did not occur: Safety/medical concerns (lack of sensation) Assistive device: Rail in hallway, Orthosis Max distance: 15 Assist level: Total assist (Pt < 25%) Walk 10 feet activity did not occur: Safety/medical concerns Assist level: Total assist (Pt < 25%) Walk 50 feet with 2 turns activity did not occur: Safety/medical concerns Walk 150 feet activity did not occur: Safety/medical concerns Walk 10 feet on uneven surfaces activity did not occur: Safety/medical concerns  Function - Comprehension Comprehension: Auditory Comprehension assist level: Understands basic 75 - 89% of the time/ requires cueing 10 - 24% of the time  Function -  Expression Expression: Verbal Expression assist level: Expresses basic 50 - 74% of the time/requires cueing 25 - 49% of the time. Needs to repeat parts of sentences.  Function - Social Interaction Social Interaction assist level: Interacts appropriately 75 - 89% of the time - Needs redirection for appropriate language or to initiate interaction.  Function - Problem Solving Problem solving assist level: Solves basic 25 - 49% of the time - needs direction more than half the time to initiate, plan or complete simple activities  Function - Memory Memory assist level: Recognizes or recalls 50 - 74% of the time/requires cueing 25 - 49% of the time Patient normally able to recall (first 3 days only): Current season  Medical Problem List and Plan: 1.  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 He remains incontinent of bowel due to poor awareness. We'll discontinue. Given his addiction history not a good choice for a home medication.               2.  DVT Prophylaxis/Anticoagulation: Subcutaneous Lovenox. Monitor platelet counts and any signs of bleeding, plt 436 on 2/10 3. Pain Management: Tylenol as needed. Pain controlled at present 4. Hypertension. Norvasc 5 mg daily, lisinopril 20 mg daily. 125/76 Monitor with increased mobility, cont current meds 5. Neuropsych: This patient is capable of making decisions on his own behalf. 6. Skin/Wound Care: Routine skin checks. OOB. Encourage appropriate nutrition 7. Fluids/Electrolytes/Nutrition: Routine I&O's   Eating 100% of his meals8. Hyperlipidemia. Lipitor 9. Tobacco/ cocaine/alcohol abuse. Provide counseling during rehab stay- cognitive deficits will limit effectiveness of compliance 10.  Hypoalbuminemia- mild  11. Spasticity R UE and LE still increased on Zanaflex,increased to 6mg  QID   monitor BP, added resting hand splint, will increase baclofen 12.  Right thigh  as well as Left glut abscess, s/p I and D Hydrotherapy d/ced  no IV  abx unless pt febrile or with increasing pain (WBC trending down on 2/10)  Pt started on PO BactrimDS will d/c on 2/22  LOS (Days) 24 A FACE TO FACE EVALUATION WAS PERFORMED  Kimeka Badour E 08/01/2015, 8:49 AM

## 2015-08-02 ENCOUNTER — Inpatient Hospital Stay (HOSPITAL_COMMUNITY): Payer: Medicaid Other | Admitting: Occupational Therapy

## 2015-08-02 ENCOUNTER — Inpatient Hospital Stay (HOSPITAL_COMMUNITY): Payer: Medicaid Other

## 2015-08-02 NOTE — Progress Notes (Signed)
Occupational Therapy Session Note  Patient Details  Name: Gerald [name] MRN: [id number] Date of Birth: 1960/12/09  Today's Date: 08/02/2015 OT Individual Time: 1100-1200 OT Individual Time Calculation (min): 60 min    Short Term Goals: Week 1:  OT Short Term Goal 1 (Week 1): Pt will maintain dynamic sitting balance during bathing tasks with mod assist. OT Short Term Goal 1 - Progress (Week 1): Not met OT Short Term Goal 2 (Week 1): Pt will complete UB dressing in supported sitting with supervision. OT Short Term Goal 2 - Progress (Week 1): Not met OT Short Term Goal 3 (Week 1): Pt will complete LB dressing with mod assist sit to stand. OT Short Term Goal 3 - Progress (Week 1): Not met OT Short Term Goal 4 (Week 1): Pt will use the RUE as a stabilizer with mod assist during grooming and bathing tasks.   Week 2:  OT Short Term Goal 1 (Week 2): Pt will maintain dynamic sitting balance during bathing tasks with mod assist. OT Short Term Goal 1 - Progress (Week 2): Not met OT Short Term Goal 2 (Week 2): Pt will complete UB dressing in supported sitting with supervision. OT Short Term Goal 2 - Progress (Week 2): Not met OT Short Term Goal 3 (Week 2): Pt will complete LB dressing with mod assist sit to stand. OT Short Term Goal 3 - Progress (Week 2): Not met OT Short Term Goal 4 (Week 2): Pt will use the RUE as a stabilizer with mod assist during grooming and bathing tasks.   OT Short Term Goal 4 - Progress (Week 2): Not met OT Short Term Goal 5 (Week 2): Pt will perform toilet transfer to the [facility] with mod assist stand pivot. OT Short Term Goal 5 - Progress (Week 2): Not met Week 3:  OT Short Term Goal 1 (Week 3): Pt will maintain dynamic sitting balance during bathing tasks with mod assist. OT Short Term Goal 1 - Progress (Week 3): Not met OT Short Term Goal 2 (Week 3): Pt will complete UB dressing in supported sitting with supervision. OT Short Term Goal 2 - Progress (Week 3): Not met OT  Short Term Goal 3 (Week 3): Pt will complete LB dressing with mod assist sit to stand. OT Short Term Goal 3 - Progress (Week 3): Met OT Short Term Goal 4 (Week 3): Pt will use the RUE as a stabilizer with mod assist during grooming and bathing tasks.   OT Short Term Goal 4 - Progress (Week 3): Met OT Short Term Goal 5 (Week 3): Pt will perform toilet transfer to the [facility] with mod assist stand pivot. OT Short Term Goal 5 - Progress (Week 3): Not met  Skilled Therapeutic Interventions/Progress Updates:    Pt seen for skilled OT to facilitate RUE NMR and dynamic trunk control/ balance. Pt received in room already bathed and dressed for the day. Pt taken to gym and completed squat pivot to the mat with mod A. Pt worked on RUE wt bearing with L hand reaching across midline, sit to semi squats focusing on maintaining trunk in midline, sit to full stand with focus on wt shift to L to avoid R lean, RUE on UE glider for full stretch, RUE towel slides on bedside table with slight active sh flex/ext.  Grasp/release facilitation with using visual focus. Pt participated well during session, and his wife transported him back to his room.  Therapy Documentation Precautions:  Precautions Precautions: Fall Precaution Comments:  right hemiparesis with increased flexor tone Restrictions Weight Bearing Restrictions: No    Vital Signs: Therapy Vitals Temp: 98.7 F (37.1 C) Temp Source: Oral Pulse Rate: 81 Resp: 16 BP: (!) 90/57 mmHg Patient Position (if appropriate): Sitting Oxygen Therapy SpO2: 98 % O2 Device: Not Delivered Pain: Pain Assessment Pain Assessment: No/denies pain Pain Score: 0-No pain ADL:   See Function Navigator for Current Functional Status.   Therapy/Group: Individual Therapy  Zeffie Bickert 08/02/2015, 4:09 PM

## 2015-08-02 NOTE — Progress Notes (Signed)
Occupational Therapy Session Note  Patient Details  Name: Gerald [name] MRN: [id number] Date of Birth: [birth year]  Today's Date: 08/02/2015 OT Individual Time: 1400-1430 OT Individual Time Calculation (min): 30 min    Short Term Goals: Week 1:  OT Short Term Goal 1 (Week 1): Pt will maintain dynamic sitting balance during bathing tasks with mod assist. OT Short Term Goal 1 - Progress (Week 1): Not met OT Short Term Goal 2 (Week 1): Pt will complete UB dressing in supported sitting with supervision. OT Short Term Goal 2 - Progress (Week 1): Not met OT Short Term Goal 3 (Week 1): Pt will complete LB dressing with mod assist sit to stand. OT Short Term Goal 3 - Progress (Week 1): Not met OT Short Term Goal 4 (Week 1): Pt will use the RUE as a stabilizer with mod assist during grooming and bathing tasks.    Skilled Therapeutic Interventions/Progress Updates:    OT session focused on trunk NMR, RUE NMR, and functional transfers. Pt completed squat pivot transfers w/c<>mat table with min assist and mod cues for sequencing due to impulsivity. Engaged in cup stacking task with focus on rotation at trunk and dynamic sitting balance. Pt required min-SBA for sitting balance, demonstrating LOB right on one occasion requiring max A to correct. Transitioned to supine for PROM and AAROM exercises. Pt noted to have tight pectoral muscles therefore OT provided passive stretching. Pt completed PNF pattern with max assist then AAROM. OT provided muscle tapping technique to bicep and tricep with increase activation noted following. Pt returned to room and left in w/c with family present.  Therapy Documentation Precautions:  Precautions Precautions: Fall Precaution Comments: right hemiparesis with increased flexor tone Restrictions Weight Bearing Restrictions: No General:   Vital Signs:   Pain: No report of pain  See Function Navigator for Current Functional Status.   Therapy/Group: Individual  Therapy  Duayne Cal 08/02/2015, 2:36 PM

## 2015-08-02 NOTE — Progress Notes (Signed)
Subjective/Complaints: No issues overnight. Wife present.   ROS- limited by cognition and speech, but appears to deny CP, SOB, N/V/D  Objective: Vital Signs: Blood pressure 118/61, pulse 81, temperature 98.3 F (36.8 C), temperature source Oral, resp. rate 18, height 6' (1.829 m), weight 78.926 kg (174 lb), SpO2 100 %. No results found. No results found for this or any previous visit (from the past 72 hour(s)).   HEENT: Normocephalic, atraumatic. poor dentition.  Cardio: RRR and no murmur Resp: Unlabored breathing, bilaterally clear to auscultation. GI: BS positive and NT, ND Skin:  No new lesions on visible skin. Warm and dry. Neuro: Alert.  Motor: RUE: 2-/5 elbow extension, 3/5 grip , grasp reflex, apraxic RLE: 0/5 proximal distal  Expressive aphasia Dysarthric speech Musc/Skel:  No tenderness. No edema. Gen: NAD vital signs reviewed. Psych: Flat affect. normal behavior.  Assessment/Plan: 1. Functional deficits secondary to Left ACA infarct with Right HP which require 3+ hours per day of interdisciplinary therapy in a comprehensive inpatient rehab setting. Physiatrist is providing close team supervision and 24 hour management of active medical problems listed below. Physiatrist and rehab team continue to assess barriers to discharge/monitor patient progress toward functional and medical goals. FIM: Function - Bathing Position: Wheelchair/chair at sink Body parts bathed by patient: Right arm, Left arm, Chest, Abdomen, Front perineal area, Right upper leg, Left upper leg, Left lower leg Body parts bathed by helper: Buttocks, Right lower leg, Back Assist Level: Touching or steadying assistance(Pt > 75%)  Function- Upper Body Dressing/Undressing What is the patient wearing?: Pull over shirt/dress Pull over shirt/dress - Perfomed by patient: Thread/unthread left sleeve, Put head through opening, Pull shirt over trunk, Thread/unthread right sleeve Pull over shirt/dress -  Perfomed by helper: Thread/unthread right sleeve Assist Level: Supervision or verbal cues Function - Lower Body Dressing/Undressing What is the patient wearing?: Pants, Shoes, AFO, Ted Hose Position: Other (comment) (bed for pants, EOB for shoes/AFO) Underwear - Performed by patient: Thread/unthread left underwear leg Underwear - Performed by helper: Thread/unthread right underwear leg, Pull underwear up/down Pants- Performed by patient: Thread/unthread left pants leg Pants- Performed by helper: Thread/unthread right pants leg, Pull pants up/down Non-skid slipper socks- Performed by patient: Don/doff left sock Non-skid slipper socks- Performed by helper: Don/doff right sock Socks - Performed by patient: Don/doff right sock, Don/doff left sock Socks - Performed by helper: Don/doff right sock, Don/doff left sock Shoes - Performed by patient: Fasten left, Don/doff left shoe Shoes - Performed by helper: Don/doff right shoe, Fasten right AFO - Performed by helper: Don/doff right AFO TED Hose - Performed by patient: Don/doff right TED hose, Don/doff left TED hose TED Hose - Performed by helper: Don/doff right TED hose, Don/doff left TED hose Assist for footwear: Partial/moderate assist Assist for lower body dressing: Touching or steadying assistance (Pt > 75%)  Function - Toileting Toileting activity did not occur: No continent bowel/bladder event Toileting steps completed by patient: Adjust clothing prior to toileting Toileting steps completed by helper: Adjust clothing prior to toileting, Performs perineal hygiene, Adjust clothing after toileting Toileting Assistive Devices: Grab bar or rail Assist level: Two helpers  Function - Air cabin crew transfer activity did not occur: Safety/medical concerns Toilet transfer assistive device: Elevated toilet seat/BSC over toilet Assist level to toilet: Moderate assist (Pt 50 - 74%/lift or lower) Assist level from toilet: Maximal assist (Pt  25 - 49%/lift and lower) Assist level to bedside commode (at bedside): 2 helpers Assist level from bedside commode (at bedside): 2  helpers  Function - Chair/bed transfer Chair/bed transfer method: Squat pivot Chair/bed transfer assist level: Moderate assist (Pt 50 - 74%/lift or lower) Chair/bed transfer assistive device: Armrests Mechanical lift: Stedy (LUE supinated to decrease pushing) Chair/bed transfer details: Verbal cues for technique, Verbal cues for sequencing, Manual facilitation for weight shifting, Tactile cues for weight shifting, Manual facilitation for placement   Function - Locomotion: Wheelchair Will patient use wheelchair at discharge?: Yes Type: Manual Max wheelchair distance: 150 Assist Level: Supervision or verbal cues Wheel 50 feet with 2 turns activity did not occur: Safety/medical concerns (fatigue) Assist Level: Supervision or verbal cues Wheel 150 feet activity did not occur: Safety/medical concerns (fatigue) Assist Level: Supervision or verbal cues Turns around,maneuvers to table,bed, and toilet,negotiates 3% grade,maneuvers on rugs and over doorsills: No Function - Locomotion: Ambulation Ambulation activity did not occur: Safety/medical concerns (lack of sensation) Assistive device: Rail in hallway, Orthosis Max distance: 25 Assist level: Maximal assist (Pt 25 - 49%) Walk 10 feet activity did not occur: Safety/medical concerns Assist level: Maximal assist (Pt 25 - 49%) Walk 50 feet with 2 turns activity did not occur: Safety/medical concerns Walk 150 feet activity did not occur: Safety/medical concerns Walk 10 feet on uneven surfaces activity did not occur: Safety/medical concerns  Function - Comprehension Comprehension: Auditory Comprehension assist level: Understands basic 75 - 89% of the time/ requires cueing 10 - 24% of the time  Function - Expression Expression: Verbal Expression assist level: Expresses basic 50 - 74% of the time/requires cueing  25 - 49% of the time. Needs to repeat parts of sentences.  Function - Social Interaction Social Interaction assist level: Interacts appropriately 75 - 89% of the time - Needs redirection for appropriate language or to initiate interaction.  Function - Problem Solving Problem solving assist level: Solves basic 50 - 74% of the time/requires cueing 25 - 49% of the time  Function - Memory Memory assist level: Recognizes or recalls 50 - 74% of the time/requires cueing 25 - 49% of the time Patient normally able to recall (first 3 days only): Current season  Medical Problem List and Plan: 1.  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  DVT Prophylaxis/Anticoagulation: Subcutaneous Lovenox. Monitor platelet counts and any signs of bleeding, plt 436 on 2/10 3. Pain Management: Tylenol as needed. Pain controlled at present 4. Hypertension. Norvasc 5 mg daily, lisinopril 20 mg daily. 125/76 Monitor with increased mobility, cont current meds 5. Neuropsych: This patient is capable of making decisions on his own behalf. 6. Skin/Wound Care: Routine skin checks. OOB. Encourage appropriate nutrition 7. Fluids/Electrolytes/Nutrition: Routine I&O's   Eating 100% of his meals 8. Hyperlipidemia. Lipitor 9. Tobacco/ cocaine/alcohol abuse. Provide counseling during rehab stay- cognitive deficits will limit effectiveness of compliance 10.  Hypoalbuminemia- mild  11. Spasticity R UE and LE still increased on Zanaflex,increased to 6mg  QID  monitor BP, added resting hand splint, will increase baclofen 12.  Right thigh  as well as Left glut abscess, s/p I and D Hydrotherapy d/ced  no IV abx unless pt febrile or with increasing pain (WBC trending down on 2/10)  Pt started on PO BactrimDS will d/c on 2/22  LOS (Days) 25 A FACE TO FACE EVALUATION WAS  PERFORMED  Mrk Buzby T 08/02/2015, 9:20 AM

## 2015-08-03 ENCOUNTER — Inpatient Hospital Stay (HOSPITAL_COMMUNITY): Payer: Self-pay | Admitting: Physical Therapy

## 2015-08-03 NOTE — Progress Notes (Signed)
Subjective/Complaints: Doing well. Denies pain. Slept well.    ROS- limited by cognition and speech, but appears to deny CP, SOB, N/V/D  Objective: Vital Signs: Blood pressure 120/79, pulse 87, temperature 98.6 F (37 C), temperature source Oral, resp. rate 17, height 6' (1.829 m), weight 78.926 kg (174 lb), SpO2 100 %. No results found. No results found for this or any previous visit (from the past 72 hour(s)).   HEENT: Normocephalic, atraumatic. poor dentition.  Cardio: RRR and no murmur Resp: Unlabored breathing, bilaterally clear to auscultation. GI: BS positive and NT, ND Skin:  No new lesions on visible skin. Warm and dry. Neuro: Alert.  Motor: RUE: 2-/5 elbow extension, 3/5 grip , grasp reflex, apraxic RLE: 0/5 proximal distal. Tone tr to 1 RLE Expressive aphasia Dysarthric speech Musc/Skel:  No tenderness. No edema. Gen: NAD vital signs reviewed. Psych: Flat affect. normal behavior.  Assessment/Plan: 1. Functional deficits secondary to Left ACA infarct with Right HP which require 3+ hours per day of interdisciplinary therapy in a comprehensive inpatient rehab setting. Physiatrist is providing close team supervision and 24 hour management of active medical problems listed below. Physiatrist and rehab team continue to assess barriers to discharge/monitor patient progress toward functional and medical goals. FIM: Function - Bathing Position: Wheelchair/chair at sink Body parts bathed by patient: Right arm, Left arm, Chest, Abdomen, Front perineal area, Right upper leg, Left upper leg, Left lower leg Body parts bathed by helper: Buttocks, Right lower leg, Back Assist Level: Touching or steadying assistance(Pt > 75%)  Function- Upper Body Dressing/Undressing What is the patient wearing?: Pull over shirt/dress Pull over shirt/dress - Perfomed by patient: Thread/unthread left sleeve, Put head through opening, Pull shirt over trunk, Thread/unthread right sleeve Pull over  shirt/dress - Perfomed by helper: Thread/unthread right sleeve Assist Level: Supervision or verbal cues Function - Lower Body Dressing/Undressing What is the patient wearing?: Pants, Shoes, AFO, Ted Hose Position: Other (comment) (bed for pants, EOB for shoes/AFO) Underwear - Performed by patient: Thread/unthread left underwear leg Underwear - Performed by helper: Thread/unthread right underwear leg, Pull underwear up/down Pants- Performed by patient: Thread/unthread left pants leg Pants- Performed by helper: Thread/unthread right pants leg, Pull pants up/down Non-skid slipper socks- Performed by patient: Don/doff left sock Non-skid slipper socks- Performed by helper: Don/doff right sock Socks - Performed by patient: Don/doff right sock, Don/doff left sock Socks - Performed by helper: Don/doff right sock, Don/doff left sock Shoes - Performed by patient: Fasten left, Don/doff left shoe Shoes - Performed by helper: Don/doff right shoe, Fasten right AFO - Performed by helper: Don/doff right AFO TED Hose - Performed by patient: Don/doff right TED hose, Don/doff left TED hose TED Hose - Performed by helper: Don/doff right TED hose, Don/doff left TED hose Assist for footwear: Partial/moderate assist Assist for lower body dressing: Touching or steadying assistance (Pt > 75%)  Function - Toileting Toileting activity did not occur: No continent bowel/bladder event Toileting steps completed by patient: Adjust clothing prior to toileting Toileting steps completed by helper: Adjust clothing prior to toileting, Performs perineal hygiene, Adjust clothing after toileting Toileting Assistive Devices: Grab bar or rail Assist level: Two helpers  Function - Air cabin crew transfer activity did not occur: Safety/medical concerns Toilet transfer assistive device: Elevated toilet seat/BSC over toilet Assist level to toilet: Moderate assist (Pt 50 - 74%/lift or lower) Assist level from toilet:  Maximal assist (Pt 25 - 49%/lift and lower) Assist level to bedside commode (at bedside): 2 helpers Assist level  from bedside commode (at bedside): 2 helpers  Function - Chair/bed transfer Chair/bed transfer method: Squat pivot Chair/bed transfer assist level: Moderate assist (Pt 50 - 74%/lift or lower) Chair/bed transfer assistive device: Armrests Mechanical lift: Stedy (LUE supinated to decrease pushing) Chair/bed transfer details: Verbal cues for technique, Verbal cues for sequencing, Manual facilitation for weight shifting, Tactile cues for weight shifting, Manual facilitation for placement   Function - Locomotion: Wheelchair Will patient use wheelchair at discharge?: Yes Type: Manual Max wheelchair distance: 150 Assist Level: Supervision or verbal cues Wheel 50 feet with 2 turns activity did not occur: Safety/medical concerns (fatigue) Assist Level: Supervision or verbal cues Wheel 150 feet activity did not occur: Safety/medical concerns (fatigue) Assist Level: Supervision or verbal cues Turns around,maneuvers to table,bed, and toilet,negotiates 3% grade,maneuvers on rugs and over doorsills: No Function - Locomotion: Ambulation Ambulation activity did not occur: Safety/medical concerns (lack of sensation) Assistive device: Rail in hallway, Orthosis Max distance: 25 Assist level: Maximal assist (Pt 25 - 49%) Walk 10 feet activity did not occur: Safety/medical concerns Assist level: Maximal assist (Pt 25 - 49%) Walk 50 feet with 2 turns activity did not occur: Safety/medical concerns Walk 150 feet activity did not occur: Safety/medical concerns Walk 10 feet on uneven surfaces activity did not occur: Safety/medical concerns  Function - Comprehension Comprehension: Auditory Comprehension assist level: Understands basic 75 - 89% of the time/ requires cueing 10 - 24% of the time  Function - Expression Expression: Verbal Expression assist level: Expresses basic 50 - 74% of the  time/requires cueing 25 - 49% of the time. Needs to repeat parts of sentences.  Function - Social Interaction Social Interaction assist level: Interacts appropriately 75 - 89% of the time - Needs redirection for appropriate language or to initiate interaction.  Function - Problem Solving Problem solving assist level: Solves basic 50 - 74% of the time/requires cueing 25 - 49% of the time  Function - Memory Memory assist level: Recognizes or recalls 50 - 74% of the time/requires cueing 25 - 49% of the time Patient normally able to recall (first 3 days only): Current season  Medical Problem List and Plan: 1.  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  DVT Prophylaxis/Anticoagulation: Subcutaneous Lovenox. Monitor platelet counts and any signs of bleeding, plt 436 on 2/10 3. Pain Management: Tylenol as needed. Pain controlled at present 4. Hypertension. Norvasc 5 mg daily, lisinopril 20 mg daily. Tight control. Continue current meds 5. Neuropsych: This patient is capable of making decisions on his own behalf. 6. Skin/Wound Care: Routine skin checks. OOB. Encourage appropriate nutrition 7. Fluids/Electrolytes/Nutrition: Routine I&O's   Eating 100% of his meals 8. Hyperlipidemia. Lipitor 9. Tobacco/ cocaine/alcohol abuse. Provide counseling during rehab stay- cognitive deficits will limit effectiveness of compliance 10.  Hypoalbuminemia- mild  11. Spasticity R UE and LE still increased on Zanaflex,increased to 6mg  QID  monitor BP, added resting hand splint,  -baclofen recently increased 12.  Right thigh  as well as Left glut abscess, s/p I and D Hydrotherapy d/ced  no IV abx unless pt febrile or with increasing pain (WBC trending down on 2/10)  Pt started on PO BactrimDS will d/c on 2/22  LOS (Days) 26 A FACE TO FACE EVALUATION WAS  PERFORMED  Margan Elias T 08/03/2015, 8:51 AM

## 2015-08-03 NOTE — Progress Notes (Signed)
Physical Therapy Session Note  Patient Details  Name: Gerald [name] MRN: [id number] Date of Birth: June 20, 1960  Today's Date: 08/03/2015 PT Individual Time: 1124-1207 PT Individual Time Calculation (min): 43 min   Short Term Goals: Week 4:  PT Short Term Goal 1 (Week 4): pt will perform standing balance task consistently with mod A PT Short Term Goal 2 (Week 4): Pt will perform functional transfers consistently with min A  Skilled Therapeutic Interventions/Progress Updates:    Pt received seated in w/c with sister, Gerald [name], present; wearing AFO. Performed w/c mobility x150' in controlled environment with L hemi technique and supervision, 50% cueing for attention to R visual field, obstacle negotiation on R side.  Performed gait 25' x2 in controlled environment with L hallway hand rail, +2A for w/c follow, max A for gait stability, and multimodal cueing for lateral weight shift to L; upright posture, L stance stability (to prevent L pelvic retraction, L genu recurvatum during ~50% of each trial); initially required manual facilitation to initiate RLE advancement without compensation via R pelvic elevation, RLE circumduction. See NMR below for further detail on NMR. Session ended in pt room, where pt was left seated in w/c with sister present, QR belt in place for safety, and all pt needs within reach.  Therapy Documentation Precautions:  Precautions Precautions: Fall Precaution Comments: right hemiparesis with increased flexor tone Restrictions Weight Bearing Restrictions: No Pain: Pain Assessment Pain Assessment: No/denies pain Pain Score: 0-No pain NMR: Standing with mirror for visual feedback, pt performed the following: static standing 1.5-2 minutes x4 trials (rest breaks required between trials) with max A due to pt tendency to push laterally to R side; cueing focused on use of mirror for postural awareness, midline orientation. Transitioned to standing, LUE lateral reaching to  facilitate lateral weight shift to L side. Attempted LLE advancement; however, this resulted in significant pushing to R side.  See Function Navigator for Current Functional Status.   Therapy/Group: Individual Therapy  Adri Schloss, Malva Cogan 08/03/2015, 12:15 PM

## 2015-08-04 ENCOUNTER — Inpatient Hospital Stay (HOSPITAL_COMMUNITY): Payer: Self-pay | Admitting: Physical Therapy

## 2015-08-04 ENCOUNTER — Inpatient Hospital Stay (HOSPITAL_COMMUNITY): Payer: Medicaid Other | Admitting: Occupational Therapy

## 2015-08-04 ENCOUNTER — Inpatient Hospital Stay (HOSPITAL_COMMUNITY): Payer: Medicaid Other | Admitting: Physical Therapy

## 2015-08-04 ENCOUNTER — Inpatient Hospital Stay (HOSPITAL_COMMUNITY): Payer: Medicaid Other | Admitting: Speech Pathology

## 2015-08-04 LAB — CBC WITH DIFFERENTIAL/PLATELET
Basophils Absolute: 0.1 10*3/uL (ref 0.0–0.1)
Basophils Relative: 1 %
Eosinophils Absolute: 0.3 10*3/uL (ref 0.0–0.7)
Eosinophils Relative: 4 %
HCT: 40.7 % (ref 39.0–52.0)
Hemoglobin: 13 g/dL (ref 13.0–17.0)
Lymphocytes Relative: 42 %
Lymphs Abs: 2.8 10*3/uL (ref 0.7–4.0)
MCH: 30.8 pg (ref 26.0–34.0)
MCHC: 31.9 g/dL (ref 30.0–36.0)
MCV: 96.4 fL (ref 78.0–100.0)
Monocytes Absolute: 0.6 10*3/uL (ref 0.1–1.0)
Monocytes Relative: 8 %
Neutro Abs: 3 10*3/uL (ref 1.7–7.7)
Neutrophils Relative %: 45 %
Platelets: 431 10*3/uL — ABNORMAL HIGH (ref 150–400)
RBC: 4.22 MIL/uL (ref 4.22–5.81)
RDW: 12.9 % (ref 11.5–15.5)
WBC: 6.7 10*3/uL (ref 4.0–10.5)

## 2015-08-04 MED ORDER — BACLOFEN 10 MG PO TABS
10.0000 mg | ORAL_TABLET | Freq: Three times a day (TID) | ORAL | Status: DC
  Administered 2015-08-04 – 2015-08-06 (×7): 10 mg via ORAL
  Filled 2015-08-04 (×7): qty 1

## 2015-08-04 NOTE — Progress Notes (Signed)
Speech Language Pathology Daily Session Note  Patient Details  Name: Gerald [name] MRN: [id number] Date of Birth: [birth year]  Today's Date: 08/04/2015 SLP Individual Time: [id number] SLP Individual Time Calculation (min): 45 min  Short Term Goals: Week 4: SLP Short Term Goal 1 (Week 4): Pt will communicate basic needs/wants via phrase level verbal expression with supervision.  SLP Short Term Goal 2 (Week 4): Patient will self-monitor and correct verbal errors within structured tasks with Min A verbal and visual cues.  SLP Short Term Goal 3 (Week 4): Patient will utilize call bell to request assistance in 75% of opportunities with Min A verbal and question cues.  SLP Short Term Goal 4 (Week 4): Patient will demonstrate selective attention to a funcitonal task in a mildly distracting enviornment for 30 minutes with supervision. SLP Short Term Goal 5 (Week 4): Patient will recall/carryover new information in regards to technique for transfers, ADL's etc, with Mod A question and verbal cues.   Skilled Therapeutic Interventions:  Pt was seen for skilled ST targeting communication goals and ongoing family education.  Pt's sister, Gerald [name] was present during today's therapy session.  Pt verbalized to therapist upon arrival that he had been incontinent of bowel with min question cues.  Pt cleaned up and transferred to wheelchair with assistance from nurse tech and [facility] lift.  SLP facilitated the session with a structured task targeting communication at the sentence level.  Pt required min-mod assist verbal cues to generate names of people, places, and things in 4 targeted categories;  Mod-max assist verbal cues needed to monitor and correct verbal perseverative errors to use generated words in a grammatically complete sentence.  SLP reviewed and reinforced skilled strategy instruction provided during first family education session.  All pt and pt's sister's questions were answered to their satisfaction at  this time.  Pt left upright in wheelchair with sister at bedside and call bell left within reach.  Continue per current plan of care.    Function:  Eating Eating                 Cognition Comprehension Comprehension assist level: Understands basic 75 - 89% of the time/ requires cueing 10 - 24% of the time  Expression   Expression assist level: Expresses basic 50 - 74% of the time/requires cueing 25 - 49% of the time. Needs to repeat parts of sentences.  Social Interaction Social Interaction assist level: Interacts appropriately 75 - 89% of the time - Needs redirection for appropriate language or to initiate interaction.  Problem Solving Problem solving assist level: Solves basic 50 - 74% of the time/requires cueing 25 - 49% of the time  Memory Memory assist level: Recognizes or recalls 50 - 74% of the time/requires cueing 25 - 49% of the time    Pain Pain Assessment Pain Assessment: No/denies pain  Therapy/Group: Individual Therapy  Clay Menser, [name] 08/04/2015, 3:36 PM

## 2015-08-04 NOTE — Progress Notes (Signed)
Physical Therapy Session Note  Patient Details  Name: Gerald [name] MRN: [id number] Date of Birth: 25-Oct-1960  Today's Date: 08/04/2015 PT Individual Time: 1100-1155 PT Individual Time Calculation (min): 55 min   Short Term Goals: Week 4:  PT Short Term Goal 1 (Week 4): pt will perform standing balance task consistently with mod A PT Short Term Goal 2 (Week 4): Pt will perform functional transfers consistently with min A  Skilled Therapeutic Interventions/Progress Updates:    Pt received seated in w/c, denies pain and agreeable to treatment. Sister Vita present for hands on education. Performed car transfer with sister providing modA. Pt required mod verbal cues for safe setup of w/c prior to transfer. Transfer w/c <>bed with min/modA. Sit >supine minA from sister with cues for repositioning and using LEs to bridge. Supine>sit minA from sister. Educated sister in bumping w/c up/down one small 3" threshold step which she reports is how they will enter the house. Therapist performed x1 trial to demonstrate to sister, then sister performed x2 trials up and down with min verbal cues initially for sequencing and correct lifting techniques with LEs instead of back, performed with modI on second trial. Sit <>stand at sink with clothespins placed on pants behind pt to facilitate trunk rotation, LUE reaching for functional bathing and hygiene. Transfer w/c <>BSC over toilet with sister again providing minA. Educated pt and sister in [place] weight bearing and facilitation for finger extension to decrease flexor tone. Remained seated in w/c at completion of session, quick release belt intact and all needs within reach. Communicated with CSW regarding equipment for d/c to include 18x18 w/c and RUE arm trough.   Therapy Documentation Precautions:  Precautions Precautions: Fall Precaution Comments: right hemiparesis with increased flexor tone Restrictions Weight Bearing Restrictions: No Pain: Pain  Assessment Pain Assessment: No/denies pain  See Function Navigator for Current Functional Status.   Therapy/Group: Individual Therapy  Luberta Mutter 08/04/2015, 12:01 PM

## 2015-08-04 NOTE — Progress Notes (Signed)
Occupational Therapy Session Note  Patient Details  Name: CORDIS CONDER MRN: [id number] Date of Birth: 1960-07-25  Today's Date: 08/04/2015 OT Individual Time: 1001-1101 OT Individual Time Calculation (min): 60 min    Short Term Goals: Week 4:  OT Short Term Goal 1 (Week 4): Continue working toward established [place].  Skilled Therapeutic Interventions/Progress Updates:    Completed bathing and dressing at the sink with sister present for education.  She was able to assist pt with sit to stand during LB bathing and for pulling pants up over hips.  Pt still mod assist for these tasks as well.  Mod instructional cueing needed for sister to assist and at times therapist having to also assist with initial sit to stand secondary to pt falling to the right side.  Pt still incontinent of bowel needing +2 assist to clean up this session.  Max assist with mod instructional cueing for hemi techniques for donning LB clothing as well.  Recommend continued OT practice with pt's sister on squat pivot transfers to drop arm commode as well as sit to stand and standing for toileting tasks.   Therapy Documentation Precautions:  Precautions Precautions: Fall Precaution Comments: right hemiparesis with increased flexor tone Restrictions Weight Bearing Restrictions: No  Pain: Pain Assessment Pain Assessment: No/denies pain ADL: See Function Navigator for Current Functional Status.   Therapy/Group: Individual Therapy  Euclide Granito OTR/L 08/04/2015, 12:13 PM

## 2015-08-04 NOTE — Progress Notes (Signed)
Physical Therapy Session Note  Patient Details  Name: Gerald [name] MRN: [id number] Date of Birth: 1960-12-10  Today's Date: 08/04/2015 PT Individual Time: [id number] PT Individual Time Calculation (min): 23 min   Short Term Goals: Week 4:  PT Short Term Goal 1 (Week 4): pt will perform standing balance task consistently with mod A PT Short Term Goal 2 (Week 4): Pt will perform functional transfers consistently with min A  Skilled Therapeutic Interventions/Progress Updates:    Pt received resting in w/c with no c/o pain and sister, Daneen Schick, present for practicing car transfer.  Pt propelled w/c to ortho gym with supervision for attention to R visual field.  PT demonstrated car transfer with pt to sister, pt requiring mod assist for squat/pivot into/out of car with verbal cues for hand placement and sequencing.  [name] returned demonstration of car transfer with supervision from PT; [name] demonstrated appropriate facilitation, verbal/tactile cueing, and body mechanics throughout transfer.  Pt returned to room at end of session and positioned with QRB in place.  [name] asked appropriate questions about pt's activity levels/restrictions at home and what the family should be doing for him and having him do for himself.  PT answered all questions to [name]'s satisfaction and pt left in room in w/c with [name] at end of session.    Therapy Documentation Precautions:  Precautions Precautions: Fall Precaution Comments: right hemiparesis with increased flexor tone Restrictions Weight Bearing Restrictions: No   See Function Navigator for Current Functional Status.   Therapy/Group: Individual Therapy  [name] 08/04/2015, 4:58 PM

## 2015-08-04 NOTE — Progress Notes (Signed)
Social Work Patient ID: Gerald [name], male   DOB: 04/07/1961, 55 y.o.   MRN: [id number] Met with sister who was here to learn pt's care, she reports it went well and she will be back tomorrow. Pt is staying optimistic and hopeful regarding his recovery from this stroke. Sister was asking about his follow up therapies and medical, have answered her questions and she is aware disability and medicaid is still pending.

## 2015-08-04 NOTE — Progress Notes (Signed)
Subjective/Complaints: Spasticity worse at noc, persists despite Zanaflex and Baclofen  ROS- limited by cognition and speech, but appears to deny CP, SOB, N/V/D  Objective: Vital Signs: Blood pressure 126/64, pulse 87, temperature 98.5 F (36.9 C), temperature source Oral, resp. rate 17, height 6' (1.829 m), weight 78.926 kg (174 lb), SpO2 98 %. No results found. No results found for this or any previous visit (from the past 72 hour(s)).   HEENT: Normocephalic, atraumatic. poor dentition.  Cardio: RRR and no murmur Resp: Unlabored breathing, bilaterally clear to auscultation. GI: BS positive and NT, ND Skin:  No new lesions on visible skin. Warm and dry. Neuro: Alert.  Motor: RUE: 2-/5 elbow extension, 3/5 grip , grasp reflex, apraxic RLE: 0/5 proximal distal. Tone tr to 1 RLE Expressive aphasia Dysarthric speech Musc/Skel:  No tenderness. No edema. Gen: NAD vital signs reviewed. Psych: Flat affect. normal behavior.  Assessment/Plan: 1. Functional deficits secondary to Left ACA infarct with Right HP which require 3+ hours per day of interdisciplinary therapy in a comprehensive inpatient rehab setting. Physiatrist is providing close team supervision and 24 hour management of active medical problems listed below. Physiatrist and rehab team continue to assess barriers to discharge/monitor patient progress toward functional and medical goals. FIM: Function - Bathing Position: Wheelchair/chair at sink Body parts bathed by patient: Right arm, Left arm, Chest, Abdomen, Front perineal area, Right upper leg, Left upper leg, Left lower leg Body parts bathed by helper: Buttocks, Right lower leg, Back Assist Level: Touching or steadying assistance(Pt > 75%)  Function- Upper Body Dressing/Undressing What is the patient wearing?: Pull over shirt/dress Pull over shirt/dress - Perfomed by patient: Thread/unthread left sleeve, Put head through opening, Pull shirt over trunk, Thread/unthread  right sleeve Pull over shirt/dress - Perfomed by helper: Thread/unthread right sleeve Assist Level: Supervision or verbal cues Function - Lower Body Dressing/Undressing What is the patient wearing?: Pants, Shoes, AFO, Ted Hose Position: Other (comment) (bed for pants, EOB for shoes/AFO) Underwear - Performed by patient: Thread/unthread left underwear leg Underwear - Performed by helper: Thread/unthread right underwear leg, Pull underwear up/down Pants- Performed by patient: Thread/unthread left pants leg Pants- Performed by helper: Thread/unthread right pants leg, Pull pants up/down Non-skid slipper socks- Performed by patient: Don/doff left sock Non-skid slipper socks- Performed by helper: Don/doff right sock Socks - Performed by patient: Don/doff right sock, Don/doff left sock Socks - Performed by helper: Don/doff right sock, Don/doff left sock Shoes - Performed by patient: Fasten left, Don/doff left shoe Shoes - Performed by helper: Don/doff right shoe, Fasten right AFO - Performed by helper: Don/doff right AFO TED Hose - Performed by patient: Don/doff right TED hose, Don/doff left TED hose TED Hose - Performed by helper: Don/doff right TED hose, Don/doff left TED hose Assist for footwear: Partial/moderate assist Assist for lower body dressing: Touching or steadying assistance (Pt > 75%)  Function - Toileting Toileting activity did not occur: No continent bowel/bladder event Toileting steps completed by patient: Adjust clothing prior to toileting Toileting steps completed by helper: Adjust clothing prior to toileting, Performs perineal hygiene, Adjust clothing after toileting Toileting Assistive Devices: Grab bar or rail Assist level: Two helpers  Function - Air cabin crew transfer activity did not occur: Safety/medical concerns Toilet transfer assistive device: Elevated toilet seat/BSC over toilet Assist level to toilet: Moderate assist (Pt 50 - 74%/lift or lower) Assist  level from toilet: Maximal assist (Pt 25 - 49%/lift and lower) Assist level to bedside commode (at bedside): 2 helpers Assist  level from bedside commode (at bedside): 2 helpers  Function - Chair/bed transfer Chair/bed transfer method: Squat pivot Chair/bed transfer assist level: Moderate assist (Pt 50 - 74%/lift or lower) Chair/bed transfer assistive device: Armrests, Orthosis Mechanical lift: Stedy (LUE supinated to decrease pushing) Chair/bed transfer details: Verbal cues for technique, Verbal cues for sequencing, Manual facilitation for weight shifting, Tactile cues for weight shifting, Manual facilitation for placement   Function - Locomotion: Wheelchair Will patient use wheelchair at discharge?: Yes Type: Manual Max wheelchair distance: 150 Assist Level: Supervision or verbal cues Wheel 50 feet with 2 turns activity did not occur: Safety/medical concerns (fatigue) Assist Level: Supervision or verbal cues Wheel 150 feet activity did not occur: Safety/medical concerns (fatigue) Assist Level: Supervision or verbal cues Turns around,maneuvers to table,bed, and toilet,negotiates 3% grade,maneuvers on rugs and over doorsills: No Function - Locomotion: Ambulation Ambulation activity did not occur: Safety/medical concerns (lack of sensation) Assistive device: Rail in hallway, Orthosis Max distance: 25 Assist level: 2 helpers (+2A for w/c follow) Walk 10 feet activity did not occur: Safety/medical concerns Assist level: 2 helpers (+2A for w/c follow) Walk 50 feet with 2 turns activity did not occur: Safety/medical concerns Walk 150 feet activity did not occur: Safety/medical concerns Walk 10 feet on uneven surfaces activity did not occur: Safety/medical concerns  Function - Comprehension Comprehension: Auditory Comprehension assist level: Understands basic 75 - 89% of the time/ requires cueing 10 - 24% of the time  Function - Expression Expression: Verbal Expression assist level:  Expresses basic 50 - 74% of the time/requires cueing 25 - 49% of the time. Needs to repeat parts of sentences.  Function - Social Interaction Social Interaction assist level: Interacts appropriately 75 - 89% of the time - Needs redirection for appropriate language or to initiate interaction.  Function - Problem Solving Problem solving assist level: Solves basic 50 - 74% of the time/requires cueing 25 - 49% of the time  Function - Memory Memory assist level: Recognizes or recalls 50 - 74% of the time/requires cueing 25 - 49% of the time Patient normally able to recall (first 3 days only): Current season  Medical Problem List and Plan: 1.  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  DVT Prophylaxis/Anticoagulation: Subcutaneous Lovenox, will stop upon D/C. Monitor platelet counts and any signs of bleeding, plt 436 on 2/10 3. Pain Management: Tylenol as needed. Pain controlled at present 4. Hypertension. Norvasc 5 mg daily, lisinopril 20 mg daily. Tight control. Continue current meds 5. Neuropsych: This patient is capable of making decisions on his own behalf. 6. Skin/Wound Care: Routine skin checks. OOB. Encourage appropriate nutrition 7. Fluids/Electrolytes/Nutrition: Routine I&O's   Eating 100% of his meals 8. Hyperlipidemia. Lipitor 9. Tobacco/ cocaine/alcohol abuse. Provide counseling during rehab stay- cognitive deficits will limit effectiveness of compliance 10.  Hypoalbuminemia- mild  11. Spasticity R UE and LE still increased on Zanaflex,6mg  QID  monitor BP, added resting hand splint,  -baclofen 5mg  TID, increase to 10mg - no signs of sedation 12.  Right thigh  as well as Left glut abscess, s/p I and D Hydrotherapy d/ced  no IV abx unless pt febrile or with increasing pain (WBC trending down on 2/10), will repeat CBC  Pt started on PO BactrimDS will d/c on 2/22  LOS (Days) 27 A  FACE TO FACE EVALUATION WAS PERFORMED  Forest Pruden E 08/04/2015, 7:13 AM

## 2015-08-05 ENCOUNTER — Inpatient Hospital Stay (HOSPITAL_COMMUNITY): Payer: Medicaid Other | Admitting: Speech Pathology

## 2015-08-05 ENCOUNTER — Inpatient Hospital Stay (HOSPITAL_COMMUNITY): Payer: Medicaid Other | Admitting: Occupational Therapy

## 2015-08-05 ENCOUNTER — Inpatient Hospital Stay (HOSPITAL_COMMUNITY): Payer: Medicaid Other | Admitting: Physical Therapy

## 2015-08-05 LAB — CREATININE, SERUM
Creatinine, Ser: 1.34 mg/dL — ABNORMAL HIGH (ref 0.61–1.24)
GFR calc Af Amer: >60 mL/min (ref >60)
GFR calc non Af Amer: 59 mL/min — ABNORMAL LOW (ref >60)

## 2015-08-05 MED ORDER — TIZANIDINE HCL 4 MG PO TABS
8.0000 mg | ORAL_TABLET | Freq: Three times a day (TID) | ORAL | Status: DC
  Administered 2015-08-05 – 2015-08-06 (×5): 8 mg via ORAL
  Filled 2015-08-05 (×6): qty 2

## 2015-08-05 NOTE — Discharge Summary (Signed)
Discharge summary job [id number]

## 2015-08-05 NOTE — Progress Notes (Signed)
Occupational Therapy Session Note  Patient Details  Name: Gerald [name] MRN: [id number] Date of Birth: 10-14-1960  Today's Date: 08/05/2015 OT Individual Time: [id number] and 1430-1455 OT Individual Time Calculation (min): 60 min and 25 min   Short Term Goals: Week 4:  OT Short Term Goal 1 (Week 4): Continue working toward established [name].  Skilled Therapeutic Interventions/Progress Updates:    1) Engaged in ADL retraining with focus on functional transfers, sit > stand, standing balance, and use of RUE.  Pt's sister not present for scheduled family education.  Completed bathing and dressing at sit > stand level at sink, secondary to not showering due to wound on buttocks.  Utilized wash mit to wash LUE with only setup assist to don.  Mod assist sit > stand with use of sink for steadying, therapist assisted with washing buttocks while pt maintained standing balance with min assist and UE support.  Noted pt starting to have BM, transferred to drop arm BSC over toilet with min assist and use of grab bar.  Pt continent BM with therapist completing hygiene in standing.  Returned to w/c to Rt with mod assist and tactile cues for positioning.  LB dressing completed with min cues for hemi-dressing technique and pt able to pull pants over hips in standing while therapist maintained standing balance. Educated on RUE placement to increase purposeful positioning as pt tends to grasp on to things and demonstrates difficulty releasing due to tone and motor planning (alien arm).  2) Treatment session with focus on functional transfers in preparation to d/c home.  Pt's sister not present for scheduled family education.  Engaged in transfer training with pt with shower and toilet transfers.  In ADL apt, educated and demonstrated squat pivot transfer to tub bench.  Mod assist for squat pivot w/c > tub bench with pt requiring multimodal cues for sequencing and technique.  Pt attempts to complete stand pivot transfer,  requiring redirection to squat pivot to increase safety.  Completed transfer w/c > drop arm commode over toilet with focus, again, on squat pivot transfers to increase safety.   Therapy Documentation Precautions:  Precautions Precautions: Fall Precaution Comments: right hemiparesis with increased flexor tone Restrictions Weight Bearing Restrictions: No General:   Vital Signs: Therapy Vitals BP: 135/80 mmHg Pain:  Pt with no c/o pain  See Function Navigator for Current Functional Status.   Therapy/Group: Individual Therapy  [name] 08/05/2015, 10:08 AM

## 2015-08-05 NOTE — Discharge Instructions (Signed)
Inpatient Rehab Discharge Instructions  Gerald [name] Discharge date and time: No discharge date for patient encounter.   Activities/Precautions/ Functional Status: Activity: activity as tolerated Diet: regular diet Wound Care: keep wound clean and dry Functional status:  ___ No restrictions     ___ Walk up steps independently ___ 24/7 supervision/assistance   ___ Walk up steps with assistance ___ Intermittent supervision/assistance  ___ Bathe/dress independently ___ Walk with walker     ___ Bathe/dress with assistance ___ Walk Independently    ___ Shower independently _x__ Walk with assistance    ___ Shower with assistance ___ No alcohol     ___ Return to work/school ________  Special Instructions: Twice a day wet-to-dry dressings to left gluteal abscess and right thigh wound per home health nurse   COMMUNITY REFERRALS UPON DISCHARGE:    Home Health:   PT, OT, SP, RN, [place]   Date of last service:08/06/2015  Medical Equipment/Items Ordered:WHEELCHAIR, Elizabeth    [phone] Other:PENDING MEDICAID AND DISABILITY-FAMILY TO CONTINUE TO WORK ON [place] [id number]  GENERAL COMMUNITY RESOURCES FOR PATIENT/FAMILY: Support Groups:[facility] SUPPORT GROUP SECOND Thursday OF EACH MONTH @ 3:00-4:00 PM ON THE REHAB UNIT QUESTIONS CONTACT KATIE [phone]  My questions have been answered and I understand these instructions. I will adhere to these goals and the provided educational materials after my discharge from the hospital.  Patient/Caregiver Signature _______________________________ Date __________  Clinician Signature _______________________________________ Date __________  Please bring this form and your medication list with you to all your follow-up doctor's appointments.  STROKE/TIA DISCHARGE INSTRUCTIONS SMOKING Cigarette smoking nearly  doubles your risk of having a stroke & is the single most alterable risk factor  If you smoke or have smoked in the last 12 months, you are advised to quit smoking for your health.  Most of the excess cardiovascular risk related to smoking disappears within a year of stopping.  Ask you doctor about anti-smoking medications  [place] Quit Line: 1-800-QUIT NOW  Free Smoking Cessation Classes [phone]  CHOLESTEROL Know your levels; limit fat & cholesterol in your diet  Lipid Panel     Component Value Date/Time   CHOL 172 07/03/2015 0311   TRIG 44 07/03/2015 0311   HDL 72 07/03/2015 0311   CHOLHDL 2.4 07/03/2015 0311   VLDL 9 07/03/2015 0311   LDLCALC 91 07/03/2015 0311      Many patients benefit from treatment even if their cholesterol is at goal.  Goal: Total Cholesterol (CHOL) less than 160  Goal:  Triglycerides (TRIG) less than 150  Goal:  HDL greater than 40  Goal:  LDL (LDLCALC) less than 100   BLOOD PRESSURE American Stroke Association blood pressure target is less that 120/80 mm/Hg  Your discharge blood pressure is:  BP: 130/78 mmHg  Monitor your blood pressure  Limit your salt and alcohol intake  Many individuals will require more than one medication for high blood pressure  DIABETES (A1c is a blood sugar average for last 3 months) Goal HGBA1c is under 7% (HBGA1c is blood sugar average for last 3 months)  Diabetes: No known diagnosis of diabetes    Lab Results  Component Value Date   HGBA1C 5.3 07/03/2015     Your HGBA1c can be lowered with medications, healthy diet, and exercise.  Check your blood sugar as directed by your physician  Call your physician if you experience unexplained or low blood  sugars.  PHYSICAL ACTIVITY/REHABILITATION Goal is 30 minutes at least 4 days per week  Activity: Increase activity slowly, Therapies: Physical Therapy: Home Health Return to work:   Activity decreases your risk of heart attack and stroke and makes your heart  stronger.  It helps control your weight and blood pressure; helps you relax and can improve your mood.  Participate in a regular exercise program.  Talk with your doctor about the best form of exercise for you (dancing, walking, swimming, cycling).  DIET/WEIGHT Goal is to maintain a healthy weight  Your discharge diet is: Diet Heart Room service appropriate?: Yes; Fluid consistency:: Thin  liquids Your height is:  Height: 6' (182.9 cm) Your current weight is: Weight: 78.926 kg (174 lb) Your Body Mass Index (BMI) is:  BMI (Calculated): 23.3  Following the type of diet specifically designed for you will help prevent another stroke.  Your goal weight range is:    Your goal Body Mass Index (BMI) is 19-24.  Healthy food habits can help reduce 3 risk factors for stroke:  High cholesterol, hypertension, and excess weight.  RESOURCES Stroke/Support Group:  Call [phone]   STROKE EDUCATION PROVIDED/REVIEWED AND GIVEN TO PATIENT Stroke warning signs and symptoms How to activate emergency medical system (call 911). Medications prescribed at discharge. Need for follow-up after discharge. Personal risk factors for stroke. Pneumonia vaccine given:  Flu vaccine given:  My questions have been answered, the writing is legible, and I understand these instructions.  I will adhere to these goals & educational materials that have been provided to me after my discharge from the hospital.

## 2015-08-05 NOTE — Progress Notes (Signed)
Occupational Therapy Discharge Summary  Patient Details  Name: Gerald [name] MRN: [id number] Date of Birth: [birth year]  Patient has met 11 of 11 long term goals due to improved activity tolerance, improved balance, postural control, ability to compensate for deficits, functional use of  RIGHT upper extremity and improved awareness.  Patient to discharge at overall Mod Assist level.  Patient's care partner is independent to provide the necessary physical and cognitive assistance at discharge.  Family education has been completed with pt's girlfriend and sister with each verbalizing and demonstrating understanding.  Would have liked more family education on transfers with sister, but she was not present last day of therapy despite scheduling.  Reasons goals not met: N/A  Recommendation:  Patient will benefit from ongoing skilled OT services in home health setting to continue to advance functional skills in the area of BADL and Reduce care partner burden.  Equipment: tub transfer bench and drop arm BSC  Reasons for discharge: treatment goals met and discharge from hospital  Patient/family agrees with progress made and goals achieved: Yes  OT Discharge Precautions/Restrictions  Precautions Precautions: Fall Precaution Comments: right hemiparesis with increased flexor tone Pain Pain Assessment Pain Assessment: No/denies pain ADL  See Function Navigator Vision/Perception  Vision- History Baseline Vision/History: No visual deficits Patient Visual Report: Blurring of vision Vision- Assessment Vision Assessment?: Vision impaired- to be further tested in functional context Additional Comments: difficult to assess secondary to aphasia and apraxia Perception Comments: continued slight inattention to Rt side Praxis Praxis-Other Comments: Noted pt with active movement in RUE but not consistently reproducable  Cognition Overall Cognitive Status: Impaired/Different from  baseline Arousal/Alertness: Awake/alert Orientation Level: Oriented X4 Attention: Sustained Sustained Attention: Appears intact Memory: Impaired Memory Impairment: Decreased recall of new information Awareness: Impaired Awareness Impairment: Emergent impairment Problem Solving: Impaired Problem Solving Impairment: Verbal basic;Functional basic Executive Function:  (impaired due to lower level deficits ) Safety/Judgment: Impaired Sensation Sensation Light Touch: Impaired Detail Light Touch Impaired Details: Impaired RUE;Impaired RLE Stereognosis: Impaired Detail Stereognosis Impaired Details: Impaired RUE Hot/Cold: Not tested Proprioception: Impaired Detail Proprioception Impaired Details: Absent RLE;Absent RUE Coordination Gross Motor Movements are Fluid and Coordinated: No Fine Motor Movements are Fluid and Coordinated: No Coordination and Movement Description: Pt currently Brunnstrum stage II movement in the arm and stage II-III in the hand.  Noted motor planning difficulty as pt was able to actively wash LUE during bathing tasks and demonstrates flexion in the digits and extension but not consistent. Finger Nose Finger Test: Unable to assess due to motor planning difficulties 9 Hole Peg Test: Unable to assess due to motor planning difficulties Extremity/Trunk Assessment RUE Assessment RUE Assessment: Exceptions to [facility] RUE Strength RUE Overall Strength Comments: Pt is currently Brunnstrum stage III movement in the arm and hand.  increased flexor tone noted in the digits, internal rotators, and elbow flexors.  No active movement noted to command in the arm but pt did exhbit some trace digit flexion and extension and able to wash with RUE with wash mitt during bathing task.  When attempting to remove any object from his hand his grip tightend as well.  This may be indicative of some motor planning deficits. LUE Assessment LUE Assessment: Within Functional Limits   See Function  Navigator for Current Functional Status.  [name] 08/05/2015, 3:34 PM

## 2015-08-05 NOTE — Progress Notes (Signed)
Physical Therapy Discharge Summary  Patient Details  Name: Gerald [name] MRN: [id number] Date of Birth: 07/14/1960  Today's Date: 08/05/2015 PT Individual Time: 1000-1100 PT Individual Time Calculation: 60 min    Patient has met 9 of 10 long term goals due to improved activity tolerance, improved balance, improved postural control, ability to compensate for deficits, functional use of  right upper extremity and right lower extremity, improved attention and improved awareness.  Patient to discharge at a wheelchair level [place].   Patient's care partner is independent to provide the necessary physical and cognitive assistance at discharge.  Reasons goals not met: Car transfer goal unmet due to pt impulsivity; requires modA.   Recommendation:  Patient will benefit from ongoing skilled PT services in home health setting to continue to advance safe functional mobility, address ongoing impairments in strength, coordination, balance, activity tolerance, and minimize fall risk.  Equipment: w/c  Reasons for discharge: treatment goals met  Patient/family agrees with progress made and goals achieved: Yes  PT Discharge Precautions/Restrictions Precautions Precautions: Fall Precaution Comments: right hemiparesis with increased flexor tone Restrictions Weight Bearing Restrictions: No Pain Pain Assessment Pain Assessment: No/denies pain Pain Score: 0-No pain Vision/Perception  Vision - Assessment Additional Comments: difficult to assess secondary to aphasia, apraxia Perception Comments: inattention to R side Praxis Praxis-Other Comments: Noted pt with active movement in RUE but not consistently reproducable  Cognition Overall Cognitive Status: Impaired/Different from baseline Arousal/Alertness: Awake/alert Orientation Level: Oriented X4 Attention: Sustained Sustained Attention: Appears intact Sustained Attention Impairment: Verbal basic;Functional basic Memory: Impaired Memory  Impairment: Decreased recall of new information Decreased Short Term Memory: Verbal basic Awareness: Impaired Awareness Impairment: Emergent impairment Problem Solving: Impaired Problem Solving Impairment: Verbal basic;Functional basic Executive Function:  (impaired due to lower level deficits ) Safety/Judgment: Impaired Sensation Sensation Light Touch: Impaired Detail Light Touch Impaired Details: Impaired RUE;Impaired RLE Stereognosis: Not tested Stereognosis Impaired Details: Impaired RUE Hot/Cold: Not tested Proprioception: Impaired Detail Proprioception Impaired Details: Absent RLE;Absent RUE Coordination Gross Motor Movements are Fluid and Coordinated: No Fine Motor Movements are Fluid and Coordinated: No Coordination and Movement Description: Pt currently Brunnstrum stage II movement in the arm and stage II-III in the hand.  Noted motor planning difficulty as pt was able to actively wash LUE during bathing tasks and demonstrates flexion in the digits and extension but not consistent. Finger Nose Finger Test: Unable to assess due to motor planning difficulties Heel Shin Test: impaired LLE, unable RLE due to extensor tone 9 Hole Peg Test: Unable to assess due to motor planning difficulties Motor  Motor Motor: Hemiplegia Motor - Discharge Observations: R hemiparesis UE/LE, extensor tone/synergy and loss of fractionation in LE  Mobility Bed Mobility Bed Mobility: Sit to Supine;Supine to Sit Supine to Sit: 4: Min guard Supine to Sit Details: Verbal cues for technique;Verbal cues for precautions/safety Sit to Supine: 5: Supervision Sit to Supine - Details: Verbal cues for technique;Verbal cues for precautions/safety Transfers Transfers: Yes Sit to Stand: 3: Mod assist Sit to Stand Details: Verbal cues for sequencing;Verbal cues for technique;Manual facilitation for placement;Manual facilitation for weight shifting Stand to Sit: 3: Mod assist Stand to Sit Details (indicate cue  type and reason): Manual facilitation for placement;Manual facilitation for weight shifting;Verbal cues for sequencing;Verbal cues for technique Squat Pivot Transfers: 4: Min assist Squat Pivot Transfer Details: Manual facilitation for placement;Verbal cues for technique;Manual facilitation for weight shifting;Manual facilitation for weight bearing Locomotion  Ambulation Ambulation: Yes Ambulation/Gait Assistance: 2: Max assist Ambulation Distance (Feet): 50 Feet  Assistive device:  (rail in hall) Ambulation/Gait Assistance Details: Verbal cues for precautions/safety;Verbal cues for technique;Verbal cues for gait pattern;Tactile cues for posture;Tactile cues for weight beaing;Tactile cues for placement Gait Gait: Yes Gait Pattern: Impaired Gait Pattern: Poor foot clearance - right;Decreased dorsiflexion - right;Decreased stride length;Decreased hip/knee flexion - right;Right genu recurvatum;Scissoring;Narrow base of support Gait velocity: 1.0 ft/sec Stairs / Additional Locomotion Stairs: Yes Stairs Assistance: 2: Max Industrial/product designer Assistance Details: Verbal cues for technique;Verbal cues for precautions/safety;Verbal cues for gait pattern;Manual facilitation for weight shifting Stair Management Technique: One rail Left;Step to pattern;Forwards Number of Stairs: 8 Height of Stairs: 3 Architect: Yes Wheelchair Assistance: 5: Supervision Wheelchair Assistance Details: Verbal cues for precautions/safety;Verbal cues for technique Wheelchair Propulsion: Left upper extremity;Left lower extremity Wheelchair Parts Management: Needs assistance Distance: 300  Trunk/Postural Assessment  Cervical Assessment Cervical Assessment: Within Functional Limits Thoracic Assessment Thoracic Assessment: Within Functional Limits Lumbar Assessment Lumbar Assessment: Exceptions to [facility] (posterior pelvic tilt, shortening L side) Postural Control Postural Control: Deficits on  evaluation Trunk Control: impaired midline orientation with significant lean toward weaker R side, poor righting reactions and balance strategies  Balance Balance Balance Assessed: Yes Static Sitting Balance Static Sitting - Balance Support: Feet supported;No upper extremity supported Static Sitting - Level of Assistance: 5: Stand by assistance Dynamic Sitting Balance Dynamic Sitting - Balance Support: Feet supported;During functional activity Dynamic Sitting - Level of Assistance: 5: Stand by assistance Static Standing Balance Static Standing - Balance Support: Left upper extremity supported Static Standing - Level of Assistance: 4: Min assist Dynamic Standing Balance Dynamic Standing - Balance Support: Left upper extremity supported;During functional activity Dynamic Standing - Level of Assistance: 3: Mod assist Extremity Assessment  RUE Assessment RUE Assessment: Exceptions to [facility] RUE Strength RUE Overall Strength Comments: Defer to OT exam LUE Assessment LUE Assessment: Within Functional Limits RLE Assessment RLE Assessment: Exceptions to [facility] RLE Strength RLE Overall Strength Comments: During MMT unable to elicit >0/2 movement in hip/knee flexors, ankle. However functionally able to support body weight during gait, strong extensor tone. RLE Tone RLE Tone Comments: hypertonic LLE Assessment LLE Assessment: Within Functional Limits  Skilled Therapeutic Intervention: Pt received seated in w/c, no c/o pain and agreeable to treatment. Assessed all mobility as described in detail above. No family present for additional family education today. Dynamic standing balance at parallel bars with alternating toe taps to 1" step; requires modA and LUE on bar for balance during LLE taps due to RLE strength and coordination deficits. Requires max/totalA for RLE progression up to step due to tone. Remained seated in w/c at completion of session, quick release belt intact and all needs within  reach.    See Function Navigator for Current Functional Status.  Benjiman Core Tygielski 08/05/2015, 3:55 PM

## 2015-08-05 NOTE — Progress Notes (Signed)
Speech Language Pathology Discharge Summary  Patient Details  Name: Gerald [name] MRN: [id number] Date of Birth: Aug 24, 1960  Today's Date: 08/05/2015 SLP Individual Time: 1100-1200 SLP Individual Time Calculation (min): 60 min   Skilled Therapeutic Interventions:  Pt was seen for skilled ST targeting communication goals. SLP administered the Western Aphasia Battery to measure progress from initial evaluation.    Pt scored a total of 80.3 out of 100 on the aphasia quotient which is indicative of mild aphasia.  Deficits most pronounced for spontaneous speech (14/20).  Pt required min assist verbal cues for initiation and awareness of verbal perseverative errors during a semi-complex verbal reasoning task.  Pt was returned to room and left in wheelchair with call bell within reach and quick release belt donned. Pt is on track for discharge tomorrow.     Patient has met 6 of 6 long term goals.  Patient to discharge at [facility] level.  Reasons goals not met: n/a   Clinical Impression/Discharge Summary:  Pt made excellent gains while inpatient and is discharging having met 6 out of 6 long term goals. 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  Pt compensates relatively well during informal dialogue with caregivers; however, he presents with increased difficulty during structured tasks.  Auditory comprehension remains relatively intact for 1-3 step simple directives but pt exhibits breakdown with 2+ step complex commands. Pt also presents with mild cognitive deficits characterized by decreased recall of daily events, impaired task initiation, and decreased functional problem solving. Pt will benefit from 24/7 supervision in addition to ST follow up at next level of care to address  cognitive-linguistic function.  Pt is discharging home with 24/7 supervision from his sisters and girlfriend.  Pt and family education is complete at this time.    Care Partner:  Caregiver Able to Provide Assistance: Yes  Type of Caregiver Assistance: Cognitive;Physical  Recommendation:  24 hour supervision/assistance;Home Health SLP  Rationale for SLP Follow Up: Maximize functional communication;Maximize cognitive function and independence;Reduce caregiver burden   Equipment: none recommended by SLP    Reasons for discharge: Discharged from hospital   Patient/Family Agrees with Progress Made and Goals Achieved: Yes   Function:  Eating Eating                 Cognition Comprehension Comprehension assist level: Understands basic 75 - 89% of the time/ requires cueing 10 - 24% of the time  Expression   Expression assist level: Expresses basic 75 - 89% of the time/requires cueing 10 - 24% of the time. Needs helper to occlude trach/needs to repeat words.  Social Interaction Social Interaction assist level: Interacts appropriately 75 - 89% of the time - Needs redirection for appropriate language or to initiate interaction.  Problem Solving Problem solving assist level: Solves basic 50 - 74% of the time/requires cueing 25 - 49% of the time  Memory Memory assist level: Recognizes or recalls 50 - 74% of the time/requires cueing 25 - 49% of the time   Emilio Math 08/05/2015, 12:54 PM

## 2015-08-05 NOTE — Discharge Summary (Signed)
[place], Gerald [name] NO.:  [id number]  MEDICAL RECORD NO.:  [id number]  LOCATION:                                 FACILITY:  PHYSICIAN:  Charlett Blake, M.D.DATE OF BIRTH:  1961/04/21  DATE OF ADMISSION:  07/08/2015 DATE OF DISCHARGE:                              DISCHARGE SUMMARY   DISCHARGE DIAGNOSES: 1. Right hemiparesis with hemisensory deficits and dysarthria     secondary to left ACA territory infarct. 2. Subcutaneous Lovenox for DVT prophylaxis. 3. Pain management. 4. Hypertension. 5. Left gluteal abscess and right thigh abscess status post I and D,     July 24, 2015. 6. Tobacco, cocaine, and alcohol abuse. 7. Spasticity right upper extremity and lower extremity.  HISTORY OF PRESENT ILLNESS:  This is a 55 year old right-handed male, history of hypertension, tobacco, alcohol, and cocaine abuse as well as CVA in 2014 with little residual, maintained on aspirin and questionable medical compliance.  By report, patient lives with significant other and independent prior to admission, but does not drive, presented on July 02, 2015 with right-sided weakness and speech difficulty.  Urine drug screen, positive for cocaine.  Blood pressure 200/120.  MRI of the brain showed acute nonhemorrhagic left anterior cerebral artery territory infarct affecting the medial and anterior frontal lobes.  MRA of the head with no large vessel occlusion.  Echocardiogram with ejection fraction of 123456, grade 1 diastolic dysfunction.  Carotid Dopplers with no ICA stenosis.  Neurology followup.  The patient did not receive tPA. Maintained on aspirin for CVA prophylaxis.  Tolerating a regular diet. Physical and occupational therapy ongoing.  The patient was admitted for comprehensive rehab program.  PAST MEDICAL HISTORY:  See discharge diagnoses.  SOCIAL HISTORY:  Lives with significant other.  Independent prior to admission.  Functional status upon admission to rehab  services with +2 physical assist stand pivot transfers, mod max assist sit to supine, mod max assist activities of daily living.  PHYSICAL EXAMINATION:  VITAL SIGNS:  Blood pressure 146/93, pulse 75, temperature 98, respirations 18. GENERAL:  This was an alert male, followed simple commands.  Fair to borderline insight and awareness of deficits.  Noted dysarthric speech. LUNGS:  Clear to auscultation without wheeze. CARDIAC:  Regular rate and rhythm.  No murmur. ABDOMEN:  Soft, nontender.  Good bowel sounds.  REHABILITATION HOSPITAL COURSE:  The patient was admitted to inpatient rehab services with therapies initiated on a 3-hour daily basis, consisting of physical therapy, occupational therapy, speech therapy, and rehabilitation nursing.  The following issues were addressed during the patient's rehabilitation stay.  Pertaining to Mr. Nordling's left ACA territory infarct remained stable, he would follow up with neurology Services, continue aspirin therapy.  Subcutaneous Lovenox for DVT prophylaxis.  No bleeding episodes.  Blood pressures controlled with Norvasc and lisinopril.  He would follow up with [facility].  History of tobacco cocaine alcohol abuse.  He received full counsel in regard to cessation of all of these illicit drug products. It was questionable if he would be compliant with request.  Noted right thigh as well as left gluteal abscess.  General surgery consulted on July 24, 2015, underwent irrigation and debridement, as  well as hydrotherapy.  Cultures negative.  Maintained on Bactrim for wound coverage until August 06, 2015, and stopped.  He remained afebrile.  A home health nurse had been arranged for wet-to-dry dressings.  The patient with spasticity right upper and lower extremity, maintained on Zanaflex as well as baclofen and close monitoring of any signs of sedation.  A resting hand splint was added.  The patient received weekly collaborative  interdisciplinary team conferences to discuss estimated length of stay, family teaching, any barriers to discharge.  Propelled wheelchair to the orthopedic gym with supervision for attention to the right visual field.  Demonstrated car transfers with sister, required moderate assist for squat pivot into and out of car with verbal cues. Activities of daily living, home making, family assisted patient sit to stand.  During lower body bathing and for pulling up pants over hips. Moderate assist for these tasks.  Moderate instructional cueing needed. Max assist with moderate instructional cueing for hemi techniques for donning lower body clothing as well.  Speech therapy targeted structured task and communication at sentence level.  Required minimum to moderate assist verbal cues to generate names of people, places and things in four targeted categories.  Moderate max assist verbal cues needed to monitor and correct verbal perseveration areas.  The patient maintained on a regular diet.  Full family teaching completed and discharged to home.  DISCHARGE MEDICATIONS: 1. Norvasc 10 mg p.o. daily. 2. Aspirin 325 mg p.o. daily. 3. Lipitor 20 mg p.o. daily. 4. Baclofen 10 mg p.o. t.i.d. 5. Flexeril 5 mg 3 times daily as needed. 6. Folic acid 1 mg p.o. daily. 7. Lisinopril 40 mg p.o. daily. 8. Multivitamin 1 tab daily. 9. Bactrim 1 tablet every 12 hours until August 06, 2015 then stop. 10.Zanaflex 8 mg p.o. t.i.d..  DIET:  Regular.  SPECIAL INSTRUCTIONS:  Wet-to-dry dressings twice daily to left buttocks and right thigh per Home Health Nurse.  The patient would follow up with Dr. Judeth Horn, call for appointment, [facility], appointment made with Dr. Alysia Penna, office to call for appointment, Dr. Leonie Man in 1 month, call for appointment.     Lauraine Rinne, P.A.   ______________________________ Charlett Blake, M.D.    DA/MEDQ  D:   08/05/2015  T:  08/05/2015  Job:  [id number]  cc:   Marcello Moores A. Cornett, M.D. Vaughan Sine, M.D. Gwenyth Ober, M.D.

## 2015-08-05 NOTE — Progress Notes (Signed)
Subjective/Complaints: Still bothered by spasms RUE and RLE ROS- limited by cognition and speech, but appears to deny CP, SOB, N/V/D  Objective: Vital Signs: Blood pressure 130/78, pulse 85, temperature 98.4 F (36.9 C), temperature source Oral, resp. rate 17, height 6' (1.829 m), weight 78.926 kg (174 lb), SpO2 95 %. No results found. Results for orders placed or performed during the hospital encounter of 07/08/15 (from the past 72 hour(s))  CBC with Differential/Platelet     Status: Abnormal   Collection Time: 08/04/15  7:55 AM  Result Value Ref Range   WBC 6.7 4.0 - 10.5 K/uL   RBC 4.22 4.22 - 5.81 MIL/uL   Hemoglobin 13.0 13.0 - 17.0 g/dL   HCT 40.7 39.0 - 52.0 %   MCV 96.4 78.0 - 100.0 fL   MCH 30.8 26.0 - 34.0 pg   MCHC 31.9 30.0 - 36.0 g/dL   RDW 12.9 11.5 - 15.5 %   Platelets 431 (H) 150 - 400 K/uL   Neutrophils Relative % 45 %   Neutro Abs 3.0 1.7 - 7.7 K/uL   Lymphocytes Relative 42 %   Lymphs Abs 2.8 0.7 - 4.0 K/uL   Monocytes Relative 8 %   Monocytes Absolute 0.6 0.1 - 1.0 K/uL   Eosinophils Relative 4 %   Eosinophils Absolute 0.3 0.0 - 0.7 K/uL   Basophils Relative 1 %   Basophils Absolute 0.1 0.0 - 0.1 K/uL  Creatinine, serum     Status: Abnormal   Collection Time: 08/05/15  5:59 AM  Result Value Ref Range   Creatinine, Ser 1.34 (H) 0.61 - 1.24 mg/dL   GFR calc non Af Amer 59 (L) >60 mL/min   GFR calc Af Amer >60 >60 mL/min    Comment: (NOTE) The eGFR has been calculated using the CKD EPI equation. This calculation has not been validated in all clinical situations. eGFR's persistently <60 mL/min signify possible Chronic Kidney Disease.      HEENT: Normocephalic, atraumatic. poor dentition.  Cardio: RRR and no murmur Resp: Unlabored breathing, bilaterally clear to auscultation. GI: BS positive and NT, ND Skin:  No new lesions on visible skin. Warm and dry. Neuro: Alert.  Motor: RUE: 2-/5 elbow extension, 3/5 grip , grasp reflex, apraxic RLE: 0/5  proximal distal. Tone tr to 1 RLE Expressive aphasia Dysarthric speech Musc/Skel:  No tenderness. No edema. Gen: NAD vital signs reviewed. Psych: Flat affect. normal behavior.  Assessment/Plan: 1. Functional deficits secondary to Left ACA infarct with Right HP which require 3+ hours per day of interdisciplinary therapy in a comprehensive inpatient rehab setting. Physiatrist is providing close team supervision and 24 hour management of active medical problems listed below. Physiatrist and rehab team continue to assess barriers to discharge/monitor patient progress toward functional and medical goals. FIM: Function - Bathing Position: Wheelchair/chair at sink Body parts bathed by patient: Right arm, Chest, Abdomen, Front perineal area, Right upper leg, Left upper leg, Left lower leg Body parts bathed by helper: Back, Right lower leg, Buttocks, Left arm Assist Level: Touching or steadying assistance(Pt > 75%)  Function- Upper Body Dressing/Undressing What is the patient wearing?: Pull over shirt/dress Pull over shirt/dress - Perfomed by patient: Thread/unthread left sleeve, Put head through opening, Pull shirt over trunk, Thread/unthread right sleeve Pull over shirt/dress - Perfomed by helper: Thread/unthread right sleeve Assist Level: Supervision or verbal cues Function - Lower Body Dressing/Undressing What is the patient wearing?: Pants, Shoes, AFO, Ted Hose Position: Other (comment) (bed for pants, EOB for shoes/AFO)  Underwear - Performed by patient: Thread/unthread left underwear leg Underwear - Performed by helper: Thread/unthread right underwear leg, Pull underwear up/down Pants- Performed by patient: Thread/unthread left pants leg Pants- Performed by helper: Thread/unthread right pants leg, Pull pants up/down Non-skid slipper socks- Performed by patient: Don/doff left sock Non-skid slipper socks- Performed by helper: Don/doff right sock Socks - Performed by patient: Don/doff right  sock, Don/doff left sock Socks - Performed by helper: Don/doff right sock, Don/doff left sock Shoes - Performed by patient: Fasten left, Don/doff left shoe Shoes - Performed by helper: Don/doff right shoe, Don/doff left shoe, Fasten right, Fasten left AFO - Performed by helper: Don/doff right AFO TED Hose - Performed by patient: Don/doff right TED hose, Don/doff left TED hose TED Hose - Performed by helper: Don/doff right TED hose, Don/doff left TED hose Assist for footwear: Partial/moderate assist Assist for lower body dressing: Touching or steadying assistance (Pt > 75%)  Function - Toileting Toileting activity did not occur: No continent bowel/bladder event Toileting steps completed by patient: Adjust clothing prior to toileting Toileting steps completed by helper: Adjust clothing prior to toileting, Performs perineal hygiene, Adjust clothing after toileting Toileting Assistive Devices: Grab bar or rail Assist level: Two helpers  Function - Air cabin crew transfer activity did not occur: Safety/medical concerns Toilet transfer assistive device: Elevated toilet seat/BSC over toilet Assist level to toilet: Touching or steadying assistance (Pt > 75%) Assist level from toilet: Touching or steadying assistance (Pt > 75%) Assist level to bedside commode (at bedside): 2 helpers Assist level from bedside commode (at bedside): 2 helpers  Function - Chair/bed transfer Chair/bed transfer method: Squat pivot Chair/bed transfer assist level: Moderate assist (Pt 50 - 74%/lift or lower) Chair/bed transfer assistive device: Armrests, Orthosis Mechanical lift: Stedy Chair/bed transfer details: Verbal cues for technique, Verbal cues for sequencing, Manual facilitation for weight shifting, Tactile cues for weight shifting, Manual facilitation for placement   Function - Locomotion: Wheelchair Will patient use wheelchair at discharge?: Yes Type: Manual Max wheelchair distance: 220 Assist  Level: Supervision or verbal cues Wheel 50 feet with 2 turns activity did not occur: Safety/medical concerns (fatigue) Assist Level: Supervision or verbal cues Wheel 150 feet activity did not occur: Safety/medical concerns (fatigue) Assist Level: Supervision or verbal cues Turns around,maneuvers to table,bed, and toilet,negotiates 3% grade,maneuvers on rugs and over doorsills: No Function - Locomotion: Ambulation Ambulation activity did not occur: Safety/medical concerns (lack of sensation) Assistive device: Rail in hallway, Orthosis Max distance: 25 Assist level: 2 helpers (+2A for w/c follow) Walk 10 feet activity did not occur: Safety/medical concerns Assist level: 2 helpers (+2A for w/c follow) Walk 50 feet with 2 turns activity did not occur: Safety/medical concerns Walk 150 feet activity did not occur: Safety/medical concerns Walk 10 feet on uneven surfaces activity did not occur: Safety/medical concerns  Function - Comprehension Comprehension: Auditory Comprehension assist level: Understands basic 75 - 89% of the time/ requires cueing 10 - 24% of the time  Function - Expression Expression: Verbal Expression assist level: Expresses basic 50 - 74% of the time/requires cueing 25 - 49% of the time. Needs to repeat parts of sentences.  Function - Social Interaction Social Interaction assist level: Interacts appropriately 75 - 89% of the time - Needs redirection for appropriate language or to initiate interaction.  Function - Problem Solving Problem solving assist level: Solves basic 50 - 74% of the time/requires cueing 25 - 49% of the time  Function - Memory Memory assist level: Recognizes or recalls  50 - 74% of the time/requires cueing 25 - 49% of the time Patient normally able to recall (first 3 days only): Current season  Medical Problem List and Plan: 1.  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  DVT Prophylaxis/Anticoagulation: Subcutaneous Lovenox, will stop upon D/C. Monitor platelet counts and any signs of bleeding, plt 436 on 2/10 3. Pain Management: Tylenol as needed. Pain controlled at present 4. Hypertension. Norvasc 5 mg daily, lisinopril 20 mg daily. Tight control. Continue current meds 5. Neuropsych: This patient is capable of making decisions on his own behalf. 6. Skin/Wound Care: Routine skin checks. OOB. Encourage appropriate nutrition 7. Fluids/Electrolytes/Nutrition: Routine I&O's   Eating 100% of his meals 8. Hyperlipidemia. Lipitor 9. Tobacco/ cocaine/alcohol abuse. Provide counseling during rehab stay- cognitive deficits will limit effectiveness of compliance 10.  Hypoalbuminemia- mild  11. Spasticity R UE and LE will increase Zanaflex,29m QID  monitor BP, added resting hand splint,  -baclofen 559mTID, increase to 1049mno signs of sedation 12.  Right thigh  as well as Left glut abscess, s/p I and D Hydrotherapy d/ced  no IV abx unless pt febrile or with increasing pain (WBC trending down on 2/10), Normal WBC on 2/20  Pt started on PO BactrimDS will d/c on 2/22  LOS (Days) 28 A FACE TO FACE EVALUATION WAS PERFORMED  Numa Schroeter E 08/05/2015, 7:31 AM

## 2015-08-06 ENCOUNTER — Encounter (HOSPITAL_COMMUNITY): Payer: Self-pay | Admitting: *Deleted

## 2015-08-06 LAB — COMPREHENSIVE METABOLIC PANEL
ALT: 34 U/L (ref 17–63)
AST: 25 U/L (ref 15–41)
Albumin: 3.1 g/dL — ABNORMAL LOW (ref 3.5–5.0)
Alkaline Phosphatase: 48 U/L (ref 38–126)
Anion gap: 8 (ref 5–15)
BUN: 16 mg/dL (ref 6–20)
CO2: 27 mmol/L (ref 22–32)
Calcium: 9.8 mg/dL (ref 8.9–10.3)
Chloride: 105 mmol/L (ref 101–111)
Creatinine, Ser: 1.24 mg/dL (ref 0.61–1.24)
GFR calc Af Amer: >60 mL/min (ref >60)
GFR calc non Af Amer: >60 mL/min (ref >60)
Glucose, Bld: 67 mg/dL (ref 65–99)
Potassium: 4.7 mmol/L (ref 3.5–5.1)
Sodium: 140 mmol/L (ref 135–145)
Total Bilirubin: 0.5 mg/dL (ref 0.3–1.2)
Total Protein: 6.6 g/dL (ref 6.5–8.1)

## 2015-08-06 MED ORDER — CYCLOBENZAPRINE HCL 5 MG PO TABS: 5.0000 mg | ORAL_TABLET | Freq: Three times a day (TID) | ORAL | Status: DC | PRN

## 2015-08-06 MED ORDER — LISINOPRIL 40 MG PO TABS: 40.0000 mg | ORAL_TABLET | Freq: Every day | ORAL | Status: DC

## 2015-08-06 MED ORDER — BACLOFEN 10 MG PO TABS
10.0000 mg | ORAL_TABLET | Freq: Three times a day (TID) | ORAL | Status: DC
Start: 2015-08-06 — End: 2015-08-20

## 2015-08-06 MED ORDER — ADULT MULTIVITAMIN W/MINERALS CH: 1.0000 | ORAL_TABLET | Freq: Every day | ORAL | Status: DC

## 2015-08-06 MED ORDER — AMLODIPINE BESYLATE 10 MG PO TABS: 10.0000 mg | ORAL_TABLET | Freq: Every day | ORAL | Status: DC

## 2015-08-06 MED ORDER — ATORVASTATIN CALCIUM 20 MG PO TABS: 20.0000 mg | ORAL_TABLET | Freq: Every day | ORAL | Status: DC

## 2015-08-06 MED ORDER — TIZANIDINE HCL 4 MG PO TABS
8.0000 mg | ORAL_TABLET | Freq: Three times a day (TID) | ORAL | Status: DC
Start: 2015-08-06 — End: 2015-08-20

## 2015-08-06 NOTE — Progress Notes (Signed)
Social Work  Discharge Note  The overall goal for the admission was met for:   Discharge location: [place] [facility]  Length of Stay: Yes-28 DAYS  Discharge activity level: Yes-MIN-MOD LEVEL OF ASSIST-WHEELCHAIR LEVEL  Home/community participation: Yes  Services provided included: MD, RD, PT, OT, SLP, RN, CM, TR, Pharmacy, Neuropsych and SW  Financial Services: Other: PENDING MEDICAID  Follow-up services arranged: Home Health: [facility]-PT,OT,RN,SP,SW, DME: [place] and Patient/Family has no preference for HH/DME agencies  Comments (or additional information):[name] AND [name] WERE HERE TO LEARN PT'S CARE ON MULTIPLE DAYS-BOTH FELT COMFORTABLE WITH HIS CARE NEEDS.  SSD AND MEDICAID PENDING AND FAMILY TO FOLLOW UP WITH. PT [place] UP PCP. WENT OVER ALL INFORMATION WITH SISTER HERE TO TRANSPORT HIM HOME.  Patient/Family verbalized understanding of follow-up arrangements: Yes  Individual responsible for coordination of the follow-up plan: [name]-GIRLFRIEND & [name]-SISTER  Confirmed correct DME delivered: [name] 08/06/2015    [name]

## 2015-08-06 NOTE — Progress Notes (Addendum)
Subjective/Complaints: Still bothered by spasms RUE and RLE ROS- limited by cognition and speech, but appears to deny CP, SOB, N/V/D  Objective: Vital Signs: Blood pressure 115/59, pulse 92, temperature 97.9 F (36.6 C), temperature source Oral, resp. rate 17, height 6' (1.829 m), weight 78.926 kg (174 lb), SpO2 99 %. No results found. Results for orders placed or performed during the hospital encounter of 07/08/15 (from the past 72 hour(s))  CBC with Differential/Platelet     Status: Abnormal   Collection Time: 08/04/15  7:55 AM  Result Value Ref Range   WBC 6.7 4.0 - 10.5 K/uL   RBC 4.22 4.22 - 5.81 MIL/uL   Hemoglobin 13.0 13.0 - 17.0 g/dL   HCT 40.7 39.0 - 52.0 %   MCV 96.4 78.0 - 100.0 fL   MCH 30.8 26.0 - 34.0 pg   MCHC 31.9 30.0 - 36.0 g/dL   RDW 12.9 11.5 - 15.5 %   Platelets 431 (H) 150 - 400 K/uL   Neutrophils Relative % 45 %   Neutro Abs 3.0 1.7 - 7.7 K/uL   Lymphocytes Relative 42 %   Lymphs Abs 2.8 0.7 - 4.0 K/uL   Monocytes Relative 8 %   Monocytes Absolute 0.6 0.1 - 1.0 K/uL   Eosinophils Relative 4 %   Eosinophils Absolute 0.3 0.0 - 0.7 K/uL   Basophils Relative 1 %   Basophils Absolute 0.1 0.0 - 0.1 K/uL  Creatinine, serum     Status: Abnormal   Collection Time: 08/05/15  5:59 AM  Result Value Ref Range   Creatinine, Ser 1.34 (H) 0.61 - 1.24 mg/dL   GFR calc non Af Amer 59 (L) >60 mL/min   GFR calc Af Amer >60 >60 mL/min    Comment: (NOTE) The eGFR has been calculated using the CKD EPI equation. This calculation has not been validated in all clinical situations. eGFR's persistently <60 mL/min signify possible Chronic Kidney Disease.      HEENT: Normocephalic, atraumatic. poor dentition.  Cardio: RRR and no murmur Resp: Unlabored breathing, bilaterally clear to auscultation. GI: BS positive and NT, ND Skin: 2 cm granulating area left buttock, W>D dressing, 1cm granulating area R lateral thigh, serosang drainage no odor Neuro: Alert.  Motor: RUE:  2-/5 elbow extension, 3/5 grip , grasp reflex, apraxic RLE: 0/5 proximal distal. Tone tr to 1 RLE Expressive aphasia Dysarthric speech Musc/Skel:  No tenderness. No edema. Gen: NAD vital signs reviewed. Psych: Flat affect. normal behavior.  Assessment/Plan: 1. Functional deficits secondary to Left ACA infarct with Right HP Stable for D/C today F/u PCP in 1-2 weeks F/u PM&R 3 weeks See D/C summary See D/C instructions FIM: Function - Bathing Position: Wheelchair/chair at sink Body parts bathed by patient: Right arm, Chest, Abdomen, Front perineal area, Right upper leg, Left upper leg, Left lower leg, Left arm, Right lower leg Body parts bathed by helper: Buttocks, Back Assist Level: Touching or steadying assistance(Pt > 75%)  Function- Upper Body Dressing/Undressing What is the patient wearing?: Pull over shirt/dress Pull over shirt/dress - Perfomed by patient: Thread/unthread left sleeve, Put head through opening, Pull shirt over trunk, Thread/unthread right sleeve Pull over shirt/dress - Perfomed by helper: Thread/unthread right sleeve Assist Level: Supervision or verbal cues Function - Lower Body Dressing/Undressing What is the patient wearing?: Pants, Shoes, AFO, Ted Hose Position: Wheelchair/chair at [employer] - Performed by patient: Thread/unthread left underwear leg Underwear - Performed by helper: Thread/unthread right underwear leg, Pull underwear up/down Pants- Performed by patient: Thread/unthread  right pants leg Pants- Performed by helper: Thread/unthread left pants leg, Pull pants up/down Non-skid slipper socks- Performed by patient: Don/doff left sock Non-skid slipper socks- Performed by helper: Don/doff right sock Socks - Performed by patient: Don/doff right sock, Don/doff left sock Socks - Performed by helper: Don/doff right sock, Don/doff left sock Shoes - Performed by patient: Fasten left, Don/doff left shoe Shoes - Performed by helper: Don/doff right  shoe, Fasten right AFO - Performed by helper: Don/doff right AFO TED Hose - Performed by patient: Don/doff right TED hose, Don/doff left TED hose TED Hose - Performed by helper: Don/doff right TED hose, Don/doff left TED hose Assist for footwear: Partial/moderate assist Assist for lower body dressing: Touching or steadying assistance (Pt > 75%) (Mod assist)  Function - Toileting Toileting activity did not occur: No continent bowel/bladder event Toileting steps completed by patient: Adjust clothing prior to toileting, Adjust clothing after toileting Toileting steps completed by helper: Performs perineal hygiene Toileting Assistive Devices: Grab bar or rail Assist level:  (Mod assist)  Function - Air cabin crew transfer activity did not occur: Safety/medical concerns Toilet transfer assistive device: Elevated toilet seat/BSC over toilet Assist level to toilet: Touching or steadying assistance (Pt > 75%) Assist level from toilet: Moderate assist (Pt 50 - 74%/lift or lower) Assist level to bedside commode (at bedside): 2 helpers Assist level from bedside commode (at bedside): 2 helpers  Function - Chair/bed transfer Chair/bed transfer method: Squat pivot Chair/bed transfer assist level: Touching or steadying assistance (Pt > 75%) Chair/bed transfer assistive device: Armrests, Orthosis Mechanical lift: Stedy Chair/bed transfer details: Verbal cues for technique, Verbal cues for sequencing, Manual facilitation for weight shifting, Tactile cues for weight shifting, Manual facilitation for placement   Function - Locomotion: Wheelchair Will patient use wheelchair at discharge?: Yes Type: Manual Wheelchair activity did not occur: Refused Max wheelchair distance: 300 Assist Level: Supervision or verbal cues Wheel 50 feet with 2 turns activity did not occur: Safety/medical concerns (fatigue) Assist Level: Supervision or verbal cues Wheel 150 feet activity did not occur:  Safety/medical concerns (fatigue) Assist Level: Supervision or verbal cues Turns around,maneuvers to table,bed, and toilet,negotiates 3% grade,maneuvers on rugs and over doorsills: No Function - Locomotion: Ambulation Ambulation activity did not occur: Safety/medical concerns (lack of sensation) Assistive device: Rail in hallway, Orthosis Max distance: 50 Assist level: 2 helpers (maxA,however +2 for w/c follow) Walk 10 feet activity did not occur: Safety/medical concerns Assist level: 2 helpers Walk 50 feet with 2 turns activity did not occur: Safety/medical concerns (ambulated 50' but no turns) Walk 150 feet activity did not occur: Safety/medical concerns Walk 10 feet on uneven surfaces activity did not occur: Safety/medical concerns  Function - Comprehension Comprehension: Auditory Comprehension assist level: Understands basic 75 - 89% of the time/ requires cueing 10 - 24% of the time  Function - Expression Expression: Verbal Expression assist level: Expresses basic 75 - 89% of the time/requires cueing 10 - 24% of the time. Needs helper to occlude trach/needs to repeat words.  Function - Social Interaction Social Interaction assist level: Interacts appropriately 75 - 89% of the time - Needs redirection for appropriate language or to initiate interaction.  Function - Problem Solving Problem solving assist level: Solves basic 75 - 89% of the time/requires cueing 10 - 24% of the time  Function - Memory Memory assist level: Recognizes or recalls 75 - 89% of the time/requires cueing 10 - 24% of the time Patient normally able to recall (first 3 days only): Current  season  Medical Problem List and Plan: 1.  Right hemiparesis, Right hemisensory deficits and dysarthria secondary to left ACA territory infarct-expect UE>LE recovery,        -.HHPT< OT SLP f/u 2.  DVT Prophylaxis/Anticoagulation: Subcutaneous Lovenox,  D/C. Monitor platelet counts and any signs of bleeding, plt 436 on  2/10 3. Pain Management: Tylenol as needed. Pain controlled at present 4. Hypertension. Norvasc 5 mg daily, lisinopril 20 mg daily. Tight control. Continue current meds 5. Neuropsych: This patient is capable of making decisions on his own behalf. 6. Skin/Wound Care: Routine skin checks. OOB. Encourage appropriate nutrition 7. Fluids/Electrolytes/Nutrition: Routine I&O's   Eating 100% of his meals 8. Hyperlipidemia. Lipitor 9. Tobacco/ cocaine/alcohol abuse. Provide counseling during rehab stay- cognitive deficits will limit effectiveness of compliance 10.  Hypoalbuminemia- mild  11. Spasticity R UE and LE will increase Zanaflex,58m QID  monitor BP, added resting hand splint,  -baclofen 576mTID, increase to 1069mno signs of sedation 12.  Right thigh  as well as Left glut abscess, s/p I and D Hydrotherapy d/ced  no IV abx unless pt febrile or with increasing pain (WBC trending down on 2/10), Normal WBC on 2/20  Pt started on PO BactrimDS will d/c on 2/22  LOS (Days) 29 A FACE TO FACE EVALUATION WAS PERFORMED  Davita Sublett E 08/06/2015, 8:23 AM

## 2015-08-06 NOTE — Progress Notes (Signed)
Patient and family received discharge instructions from Marlowe Shores, PA-C with verbal understanding. Patient was discharged with family, belongings, and equipment.

## 2015-08-08 ENCOUNTER — Telehealth: Payer: Self-pay | Admitting: Physical Medicine & Rehabilitation

## 2015-08-08 ENCOUNTER — Encounter: Payer: Self-pay | Admitting: Clinical

## 2015-08-08 ENCOUNTER — Ambulatory Visit: Payer: Self-pay | Attending: Physician Assistant | Admitting: Physician Assistant

## 2015-08-08 VITALS — BP 117/73 | HR 75 | Temp 97.6°F | Resp 16 | Ht 72.0 in | Wt 174.0 lb

## 2015-08-08 DIAGNOSIS — I63322 Cerebral infarction due to thrombosis of left anterior cerebral artery: Secondary | ICD-10-CM

## 2015-08-08 DIAGNOSIS — R471 Dysarthria and anarthria: Secondary | ICD-10-CM | POA: Insufficient documentation

## 2015-08-08 DIAGNOSIS — Z87891 Personal history of nicotine dependence: Secondary | ICD-10-CM | POA: Insufficient documentation

## 2015-08-08 DIAGNOSIS — F1010 Alcohol abuse, uncomplicated: Secondary | ICD-10-CM | POA: Insufficient documentation

## 2015-08-08 DIAGNOSIS — Z79899 Other long term (current) drug therapy: Secondary | ICD-10-CM | POA: Insufficient documentation

## 2015-08-08 DIAGNOSIS — Z131 Encounter for screening for diabetes mellitus: Secondary | ICD-10-CM

## 2015-08-08 DIAGNOSIS — F1410 Cocaine abuse, uncomplicated: Secondary | ICD-10-CM | POA: Insufficient documentation

## 2015-08-08 DIAGNOSIS — I69351 Hemiplegia and hemiparesis following cerebral infarction affecting right dominant side: Secondary | ICD-10-CM | POA: Insufficient documentation

## 2015-08-08 DIAGNOSIS — F17219 Nicotine dependence, cigarettes, with unspecified nicotine-induced disorders: Secondary | ICD-10-CM

## 2015-08-08 DIAGNOSIS — I10 Essential (primary) hypertension: Secondary | ICD-10-CM | POA: Insufficient documentation

## 2015-08-08 DIAGNOSIS — A4902 Methicillin resistant Staphylococcus aureus infection, unspecified site: Secondary | ICD-10-CM | POA: Insufficient documentation

## 2015-08-08 LAB — POCT GLYCOSYLATED HEMOGLOBIN (HGB A1C): Hemoglobin A1C: 5

## 2015-08-08 NOTE — Progress Notes (Addendum)
Patient ID: Gerald [name], male   DOB: 1961/06/03, 55 y.o.   MRN: [id number]   Brisyn Kazee, is a 55 y.o. male  [id number]  [id number]  DOB - 1960-10-04  No chief complaint on file.       Subjective:   Gerald [name] is a 55 y.o. male here today for a follow up visit following recent hospitalization for CVA secondary to left ACA territory infarct and Right hemiparesis with hemisensory deficits and dysarthria which have improved somewhat with Physical therapy.  He was discharged from Physical Medicine and Rehabilitation 2 days ago subsequent to his hospitalization.  He was also diagnosed with a MRSA abscess of his R thigh and gluteal area and was treated with I&D and Septra. This has resolved. Patient has No headache, No chest pain, No abdominal pain - No Nausea, No new weakness tingling or numbness, No Cough - SOB.  No problems updated.  ALLERGIES: No Known Allergies  PAST MEDICAL HISTORY: Past Medical History  Diagnosis Date  . Hypertension   . Alcohol abuse   . Tobacco use   . Cocaine abuse 2014  . Stroke [facility]) 2014    denies residual on 07/03/2015  . Stroke [facility]) 07/02/2015    "now weak on right side; speech problems" (07/03/2015)    MEDICATIONS AT HOME: Prior to Admission medications   Medication Sig Start Date End Date Taking? Authorizing Provider  amLODipine (NORVASC) 10 MG tablet Take 1 tablet (10 mg total) by mouth daily. 08/06/15   Lavon Paganini Angiulli, PA-C  atorvastatin (LIPITOR) 20 MG tablet Take 1 tablet (20 mg total) by mouth daily at 6 PM. 08/06/15   Lavon Paganini Angiulli, PA-C  baclofen (LIORESAL) 10 MG tablet Take 1 tablet (10 mg total) by mouth 3 (three) times daily. 08/06/15   Lavon Paganini Angiulli, PA-C  lisinopril (PRINIVIL,ZESTRIL) 40 MG tablet Take 1 tablet (40 mg total) by mouth daily. 08/06/15   Lavon Paganini Angiulli, PA-C  Multiple Vitamin (MULTIVITAMIN WITH MINERALS) TABS tablet Take 1 tablet by mouth daily. 08/06/15   Lavon Paganini Angiulli, PA-C  tiZANidine  (ZANAFLEX) 4 MG tablet Take 2 tablets (8 mg total) by mouth 4 (four) times daily - after meals and at bedtime. 08/06/15   Lavon Paganini Angiulli, PA-C     Objective:   There were no vitals filed for this visit.  Exam General appearance : Awake, alert, not in any distress. Speech Relatively Clear. Not toxic looking HEENT: Atraumatic and Normocephalic, pupils equally reactive to light and accomodation Neck: supple, no JVD. No cervical lymphadenopathy.  Chest:Good air entry bilaterally, no added sounds  CVS: S1 S2 regular, no murmurs.  Abdomen: Bowel sounds present, Non tender and not distended with no gaurding, rigidity or rebound. Extremities: B/L Lower Ext shows no edema, both legs are warm to touch Neurology: Awake alert, and oriented X 3.  He presents in a wheelchair with his sister for assistance.  He has decreased strength and motor ability throughout on R.  Unable to release grasp without assistance on R hand.   Skin:No Rash.  L buttocks from I & D-there is a 1.5 cm denuded area that is warm, dry, and intact without erythema or drainage or ulceration.  No sign of active infection and non-tender.   Data Review Lab Results  Component Value Date   HGBA1C 5.3 07/03/2015   HGBA1C 5.1 01/16/2013     Assessment & Plan   1. Cerebrovascular accident (CVA) due to thrombosis of left anterior cerebral artery ([place])  Continues to have significant neurological deficits and R sided paralysis/decreased mobility and requiring wheelchair and assistance for ADL.  Keep appointment with Dr. Leonie Man for 09/02/2015.  He is on flexeril and zanaflex for muscle spasticity and overcontraction. He was discharged to home to continue Aspirin 325mg  daily.  He doesn't have any new complaints.  He is not in need of refills.  His sister states that the packing fell out of the sight where his I&D was performed on his buttocks and wants [place] to check it.  He denies pain or fever.   2. Alcohol abuse Hasn't drank or used  cocaine since event.  Encouraged 12 step recovery/AA.    3. Infection of wound due to methicillin resistant Staphylococcus aureus (MRSA) I&D site healing by secondary intention.  Wound care and home health to continue.  Finished antibiotics 2 days ago.  No sign of infection.  Check area daily for signs of infection when cleaning and check oral temps bid.  Call our office if any fever.  Recheck wound in 2 weeks when here to establish care.   4. Essential hypertension Currently controlled.  No changes.   5. Cigarette nicotine dependence with nicotine-induced disorder Currently not smoking.  Encouraged him to continue with cessation  Patient have been counseled extensively about nutrition, rest, and exercise  F/up in about 2 weeks. Keep appt with Dr. Leonie Man.  The patient was given clear instructions to go to ER or return to medical center if symptoms don't improve, worsen or new problems develop. The patient verbalized understanding. The patient was told to call to get lab results if they haven't heard anything in the next week.      Freeman Caldron, PA-C [facility] [place], [place]   08/08/2015, 9:42 AM

## 2015-08-08 NOTE — Progress Notes (Signed)
Patient's here for f/up stroke. Patient states he feels good today. No concerns there.   Patient sister who's with him however is concern about wound on patient L buttock.  Patient sister states that his packing came out yesterday and she tried to reach out to [place] to come out to repack patient wound.  Patient agreed to A1c screening. Patient has had the flu shot.  Patient would like to est. care with PCP.

## 2015-08-08 NOTE — Progress Notes (Signed)
Depression screen [facility] 2/9 08/08/2015 08/08/2015  Decreased Interest 0 0  Down, Depressed, Hopeless 2 0  PHQ - 2 Score 2 0  Altered sleeping 0 -  Tired, decreased energy 1 -  Change in appetite 0 -  Feeling bad or failure about yourself  0 -  Trouble concentrating 3 -  Moving slowly or fidgety/restless 3 -  Suicidal thoughts 0 -  PHQ-9 Score 9 -    GAD 7 : Generalized Anxiety Score 08/08/2015  Nervous, Anxious, on Edge 1  Control/stop worrying 1  Worry too much - different things 1  Trouble relaxing 1  Restless 0  Easily annoyed or irritable 1  Afraid - awful might happen 1  Total GAD 7 Score 6

## 2015-08-08 NOTE — Patient Instructions (Addendum)
Keep appointment with Dr. Leonie Man on 09/02/2015.  Check temperature 1-2X daily and call our office if any fever. Follow instructions of home health and continue Physical Therapy. Aspirin 325mg  daily.   Delayed Wound Closure Sometimes, your health care provider will decide to delay closing a wound for several days. This is done when the wound is badly bruised, dirty, or when it has been several hours since the injury happened. By delaying the closure of your wound, the risk of infection is reduced. Wounds that are closed in 3-7 days after being cleaned up and dressed heal just as well as those that are closed right away. HOME CARE INSTRUCTIONS  Rest and elevate the injured area until the pain and swelling are gone.  Have your wound checked as instructed by your health care provider. SEEK MEDICAL CARE IF:  You develop unusual or increased swelling or redness around the wound.  You have increasing pain or tenderness.  There is increasing fluid (drainage) or a bad smelling drainage coming from the wound.   This information is not intended to replace advice given to you by your health care provider. Make sure you discuss any questions you have with your health care provider.   Document Released: 05/31/2005 Document Revised: 06/05/2013 Document Reviewed: 11/28/2012 Elsevier Interactive Patient Education 2016 [place] and the Family WHAT IS ADDICTION? Addiction is a complex disease of the brain. It causes an uncontrollable (compulsive) need for a substance. You can be addicted to alcohol or illegal drugs or to prescription medicines, such as painkillers. Addiction can also be a behavior, like gambling or shopping. The need for the drug or activity can become so strong that you think about it all the time. You can also become physically dependent on a substance.  Addiction can change the way your brain works. Because of these changes, getting more of whatever you are addicted to  becomes the most important thing to you and feels better than other activities or relationships. Addiction can lead to changes in health, behavior, emotions, relationships, and choices that affect you and everyone around you. WHAT ARE COMMON SIGNS OF ADDICTION? Addiction can cause behavioral and emotional signs, as well as physical signs. Behavioral signs of addiction may include:  Having an intense craving for whatever you are addicted to.  Always thinking about your addiction.  Planning your life around your addiction.  Being unable to stop using a substance or participating in a behavior.  Devoting more time to the addiction. This might mean you no longer go to school or work or spend time with people you enjoy.  Having an increasing need for money. An addiction might make you ask people for unusual loans or steal items to sell.  Having exaggerated emotional responses to difficult situations. These may include:  Feeling anxious or stressed out.  Extreme irritability.  Aggression.  Lying.  Continuing with the addiction even after it has caused a bad outcome, such as:  Poor health.  Damaged relationships.  Money loss.  Job loss.  Getting hurt or arrested.  Having trouble being realistic about the negative effects of addiction. Physical signs of addiction may include:  Bloodshot eyes.  Nosebleeds.  Poor hygiene.  Changing sleep patterns.  Shakes and tremors.  Slurred speech.  Confusion.  Unconsciousness. HOW CAN ADDICTION AFFECT FAMILY MEMBERS? Addiction affects everyone in a family. It touches all aspects of family life, including finances, communication, work, school, and free time. Children of an addict might have:   Birth  defects, if the mother was addicted during pregnancy.  Physical, emotional, and behavioral problems.  Trouble in school.  Injury from violence, abuse, or accidents.  Problems because of neglect.  A greater risk for  addiction later in life. Parents of an addict might experience:   Confusion.  Worry.  Guilt.  Fear.  Sadness.  A desire to make the addiction seem less of a problem than it is.  Financial strain.  Feeling isolated from family and friends.  Physical health changes from stress or from violent confrontations. Brothers, sisters, and extended family members of an addict might experience:  Worry.  Anger.  Fear.  Resentment.  Confusion.  A desire to hide the addiction and its impact.  Financial strain. HOW DO I KNOW IF TREATMENT FOR ADDICTION IS NEEDED? Addiction is a progressive disease. Without treatment, addiction can get worse. Living with addiction puts you at higher risk for injury, poor health, lost employment, loss of money, and even death.  You might need treatment for addiction if:  You have tried to stop or cut down, and you cannot.  Your addiction is causing physical health problems.  You find it annoying that your friends and family are concerned about your alcohol or substance use.  You feel guilty about substance abuse or a behavior.  You have lied or tried to hide your addiction.  You need a particular substance to start your day or calm down.  You are getting in trouble at school, work, home, or with the police.  You have done something illegal to support your addiction.  You are running out of money because of your addiction.  You have no time for anything other than your addiction. WHAT TYPES OF TREATMENT OPTIONS ARE AVAILABLE? Treatment for the Addict The treatment program that is right for you will depend on many factors, including the type of addiction you have. Treatment programs can be outpatient or inpatient. In an outpatient program, you live at home and go to work but also go to a clinic for treatment. With an inpatient program, you sleep and live at the program facility during treatment. After treatment, you might need a plan for  support during recovery. Other treatment options include:  Medicine.  Some addictions may be treated with prescription medicines.  You might also need medicine to treat anxiety or depression.  Counseling and behavior therapy. Therapy can help individuals and families behave and relate more effectively.  Support groups. Confidential group therapy, such as twelve-step program, can help individuals and families during treatment and recovery. Treatment for Family Members Addiction affects the entire family. Ask your health care provider or counselor to recommend resources for family members. You can call [employer] at 402 067 6080 or visit their website at http://www.nar-anon.org/find-a-group/ [place]?  Ask your health care provider for help finding addiction treatment. These discussions are confidential.  The [employer] on Alcoholism and Drug Dependence (NCADD). This group has information on treatment centers and programs for people who have an addiction and for family members.  Their telephone number is 1-800-NCA-CALL.  Their website is https://ncadd.org/affiliate-network/find-an-affiliate  The Substance Abuse and Mental Health Services Administration [facility]). This group will help you find publicly funded treatment centers, help hotlines, and counseling services near you.  Their telephone number is 1-800-662-HELP (4357).  Their website is www.findtreatment.[employer].com.cy   This information is not intended to replace advice given to you by your health care provider. Make sure you discuss any questions you have with your health  care provider.   Document Released: 02/04/2004 Document Revised: 06/21/2014 Document Reviewed: 08/20/2013 Elsevier Interactive Patient Education [employer].

## 2015-08-08 NOTE — Telephone Encounter (Addendum)
LVM on [phone] for Mr. Pacifico or caregiver to call back.  1. Are you/is patient experiencing any problems since coming home? Are there any questions regarding any aspect of care? 2. Are there any questions regarding medications administration/dosing? Are meds being taken as prescribed? Patient should review meds with caller to confirm 3. Have there been any falls? 4. Has Home Health been to the house and/or have they contacted you? If not, have you tried to contact them? Can we help you contact them? 5. Are bowels and bladder emptying properly? Are there any unexpected incontinence issues? If applicable, is patient following bowel/bladder programs? 6. Any fevers, problems with breathing, unexpected pain? 7. Are there any skin problems or new areas of breakdown? 8. Has the patient/family member arranged specialty MD follow up (ie cardiology/neurology/renal/surgical/etc)?  Can we help arrange? 9. Does the patient need any other services or support that we can help arrange? 10. Are caregivers following through as expected in assisting the patient?  Unable to reach patient. Finally today 08-15-15 we were able to get in touch with the daughter to schedule the appointment on 08-21-15

## 2015-08-20 ENCOUNTER — Ambulatory Visit: Payer: Medicaid Other | Attending: Family Medicine | Admitting: Family Medicine

## 2015-08-20 ENCOUNTER — Encounter: Payer: Self-pay | Admitting: Family Medicine

## 2015-08-20 VITALS — BP 153/104 | HR 96 | Temp 98.3°F | Resp 18 | Wt 168.0 lb

## 2015-08-20 DIAGNOSIS — I10 Essential (primary) hypertension: Secondary | ICD-10-CM

## 2015-08-20 DIAGNOSIS — Z79899 Other long term (current) drug therapy: Secondary | ICD-10-CM | POA: Diagnosis not present

## 2015-08-20 DIAGNOSIS — L0231 Cutaneous abscess of buttock: Secondary | ICD-10-CM | POA: Insufficient documentation

## 2015-08-20 DIAGNOSIS — F17210 Nicotine dependence, cigarettes, uncomplicated: Secondary | ICD-10-CM | POA: Insufficient documentation

## 2015-08-20 DIAGNOSIS — I6329 Cerebral infarction due to unspecified occlusion or stenosis of other precerebral arteries: Secondary | ICD-10-CM

## 2015-08-20 DIAGNOSIS — A4902 Methicillin resistant Staphylococcus aureus infection, unspecified site: Secondary | ICD-10-CM | POA: Diagnosis not present

## 2015-08-20 DIAGNOSIS — I69922 Dysarthria following unspecified cerebrovascular disease: Secondary | ICD-10-CM

## 2015-08-20 DIAGNOSIS — F17219 Nicotine dependence, cigarettes, with unspecified nicotine-induced disorders: Secondary | ICD-10-CM

## 2015-08-20 DIAGNOSIS — Z7982 Long term (current) use of aspirin: Secondary | ICD-10-CM | POA: Insufficient documentation

## 2015-08-20 DIAGNOSIS — I69322 Dysarthria following cerebral infarction: Secondary | ICD-10-CM

## 2015-08-20 DIAGNOSIS — Z8673 Personal history of transient ischemic attack (TIA), and cerebral infarction without residual deficits: Secondary | ICD-10-CM | POA: Insufficient documentation

## 2015-08-20 DIAGNOSIS — R471 Dysarthria and anarthria: Secondary | ICD-10-CM | POA: Insufficient documentation

## 2015-08-20 DIAGNOSIS — I69351 Hemiplegia and hemiparesis following cerebral infarction affecting right dominant side: Secondary | ICD-10-CM | POA: Diagnosis not present

## 2015-08-20 MED ORDER — ASPIRIN 325 MG PO TBEC: 325.0000 mg | DELAYED_RELEASE_TABLET | Freq: Every day | ORAL | Status: DC

## 2015-08-20 MED ORDER — BACLOFEN 10 MG PO TABS: 10.0000 mg | ORAL_TABLET | Freq: Three times a day (TID) | ORAL | Status: DC

## 2015-08-20 MED ORDER — AMLODIPINE BESYLATE 10 MG PO TABS: 10.0000 mg | ORAL_TABLET | Freq: Every day | ORAL | Status: DC

## 2015-08-20 MED ORDER — TIZANIDINE HCL 4 MG PO TABS: 4.0000 mg | ORAL_TABLET | Freq: Three times a day (TID) | ORAL | Status: DC

## 2015-08-20 MED ORDER — ATORVASTATIN CALCIUM 20 MG PO TABS: 20.0000 mg | ORAL_TABLET | Freq: Every day | ORAL | Status: DC

## 2015-08-20 MED ORDER — LISINOPRIL 40 MG PO TABS: 40.0000 mg | ORAL_TABLET | Freq: Every day | ORAL | Status: DC

## 2015-08-20 MED FILL — LISINOPRIL 40 MG TABLET: 40 | 30 days supply | Qty: 30 | Fill #0

## 2015-08-20 MED FILL — AMLODIPINE BESYLATE 10 MG T: 10 | 30 days supply | Qty: 30 | Fill #0

## 2015-08-20 MED FILL — ?ATORVASTATIN 20 MG TABLET: 20 | 30 days supply | Qty: 30 | Fill #0

## 2015-08-20 MED FILL — BACLOFEN 10 MG TABLET: 10 | 30 days supply | Qty: 90 | Fill #0

## 2015-08-20 MED FILL — ?TIZANIDINE HCL 4 MG CAPSUL: 4 | 30 days supply | Qty: 90 | Fill #0

## 2015-08-20 NOTE — Progress Notes (Signed)
Patient's here for hospital f/up for stroke and to est.care with PCP.  Patient states he feels good today, no pain. Patient BP was elevated. States he doesn't feel dizzy, no concerns there.   Patient has taken his meds this morning at 8am.  Patient requesting refills on all meds.

## 2015-08-20 NOTE — Progress Notes (Signed)
Subjective:  Patient ID: Gerald [name], male    DOB: 1960-10-05  Age: 55 y.o. MRN: [id number]  CC: Hospitalization Follow-up and Establish Care   HPI Gerald [name] presents to establish care. Medical history significant for hypertension, tobacco abuse, previous CVA, recent hospitalization for CVA due to infarct in the left ACA territory with right hemiparesis and dysarthria who (did not receive TPA due to delayed presentation; BP was 205/123 on admission) , left gluteal abscess secondary to MRSA.  He had a hospital follow-up and was seen by the PA 2 weeks ago and reports doing well and is undergoing physical therapy, speech therapy and occupational therapy as per schedule. Wound care of his left gluteal abscess is performed by [facility] will perform dressing changes and he has completed his course of antibiotics.  He had an upcoming appointment with rehabilitation medicine (Dr Letta Pate) Neurology (Dr Leonie Man). His blood pressure is elevated today and review of his previous blood pressures revealed well controlled blood pressure with 117/73 his last office visit here in the clinic. He is accompanied by his sister for today's visit is requesting refills of his medications.  Outpatient Prescriptions Prior to Visit  Medication Sig Dispense Refill  . Multiple Vitamin (MULTIVITAMIN WITH MINERALS) TABS tablet Take 1 tablet by mouth daily.    Marland Kitchen amLODipine (NORVASC) 10 MG tablet Take 1 tablet (10 mg total) by mouth daily. 30 tablet 1  . aspirin 325 MG EC tablet Take 325 mg by mouth daily.    Marland Kitchen atorvastatin (LIPITOR) 20 MG tablet Take 1 tablet (20 mg total) by mouth daily at 6 PM. 30 tablet 1  . baclofen (LIORESAL) 10 MG tablet Take 1 tablet (10 mg total) by mouth 3 (three) times daily. 90 each 1  . lisinopril (PRINIVIL,ZESTRIL) 40 MG tablet Take 1 tablet (40 mg total) by mouth daily. 30 tablet 1  . tiZANidine (ZANAFLEX) 4 MG tablet Take 2 tablets (8 mg total) by mouth 4 (four) times  daily - after meals and at bedtime. 90 tablet 0   No facility-administered medications prior to visit.    ROS Review of Systems  Constitutional: Negative for activity change and appetite change.  HENT: Negative for sinus pressure and sore throat.   Eyes: Negative for visual disturbance.  Respiratory: Negative for cough, chest tightness and shortness of breath.   Cardiovascular: Negative for chest pain and leg swelling.  Gastrointestinal: Negative for abdominal pain, diarrhea, constipation and abdominal distention.  Endocrine: Negative.   Genitourinary: Negative for dysuria.  Musculoskeletal: Negative for myalgias and joint swelling.  Skin: Negative for rash.  Allergic/Immunologic: Negative.   Neurological: Negative for weakness, light-headedness and numbness.  Psychiatric/Behavioral: Negative for suicidal ideas and dysphoric mood.    Objective:  BP 153/104 mmHg  Pulse 96  Temp(Src) 98.3 F (36.8 C) (Oral)  Resp 18  Wt 168 lb (76.204 kg)  SpO2 99%  BP/Weight 08/20/2015 08/08/2015 0000000  Systolic BP 0000000 [id number]  Diastolic BP 123456 73 59  Wt. (Lbs) 168 174 -  BMI 22.78 23.59 -      Physical Exam  Constitutional: He is oriented to person, place, and time. He appears well-developed and well-nourished.  Appears comfortable sitting in a wheelchair  Cardiovascular: Normal rate, normal heart sounds and intact distal pulses.   No murmur heard. Pulmonary/Chest: Effort normal and breath sounds normal. He has no wheezes. He has no rales. He exhibits no tenderness.  Abdominal: Soft. Bowel sounds are normal. He exhibits no distension and  no mass. There is no tenderness.  Musculoskeletal: Normal range of motion.  Neurological: He is alert and oriented to person, place, and time.  Motor strength: Left upper extremity-5/5 Left lower extremity-5/5 Right upper extremity-3/5 Right lower extremity -2/5   Skin:  Left buttock with a 1 x 1 cm ulcer with granulation tissue, minimal  bloody discharge noted.     Assessment & Plan:   1. Essential hypertension Uncontrolled Blood pressure 2 weeks ago was 117/73 and so I will make no regimen changes and reassess at his next visit Low-sodium, DASH diet - lisinopril (PRINIVIL,ZESTRIL) 40 MG tablet; Take 1 tablet (40 mg total) by mouth daily.  Dispense: 30 tablet; Refill: 3 - amLODipine (NORVASC) 10 MG tablet; Take 1 tablet (10 mg total) by mouth daily.  Dispense: 30 tablet; Refill: 3  2. Cigarette nicotine dependence with nicotine-induced disorder States he has not smoked in the last 2 weeks We'll reassess at next visit  3. 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 Scheduled to see neurology on 09/02/15 - baclofen (LIORESAL) 10 MG tablet; Take 1 tablet (10 mg total) by mouth 3 (three) times daily.  Dispense: 90 each; Refill: 3 - tiZANidine (ZANAFLEX) 4 MG tablet; Take 1 tablet (4 mg total) by mouth 3 (three) times daily.  Dispense: 90 tablet; Refill: 3 - aspirin 325 MG EC tablet; Take 1 tablet (325 mg total) by mouth daily.  Dispense: 30 tablet; Refill: 3  4. 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 Infection of wound due to methicillin resistant Staphylococcus aureus (MRSA) Wound is healing well Continue wound dressing changes as per home health schedule; dressing change performed in the clinic today.   Meds ordered this encounter  Medications  . lisinopril (PRINIVIL,ZESTRIL) 40 MG tablet    Sig: Take 1 tablet (40 mg total) by mouth daily.    Dispense:  30 tablet    Refill:  3  . amLODipine (NORVASC) 10 MG tablet    Sig: Take 1 tablet (10 mg total) by mouth daily.    Dispense:  30 tablet    Refill:  3  . baclofen (LIORESAL) 10 MG tablet    Sig: Take 1 tablet (10 mg total) by mouth 3 (three) times daily.    Dispense:  90 each    Refill:  3  .  tiZANidine (ZANAFLEX) 4 MG tablet    Sig: Take 1 tablet (4 mg total) by mouth 3 (three) times daily.    Dispense:  90 tablet    Refill:  3  . atorvastatin (LIPITOR) 20 MG tablet    Sig: Take 1 tablet (20 mg total) by mouth daily at 6 PM.    Dispense:  30 tablet    Refill:  3  . aspirin 325 MG EC tablet    Sig: Take 1 tablet (325 mg total) by mouth daily.    Dispense:  30 tablet    Refill:  3    Follow-up: Return in about 6 weeks (around 10/01/2015) for f/u on hypertension.   Arnoldo Morale MD

## 2015-08-20 NOTE — Patient Instructions (Signed)
Hypertension Hypertension, commonly called high blood pressure, is when the force of blood pumping through your arteries is too strong. Your arteries are the blood vessels that carry blood from your heart throughout your body. A blood pressure reading consists of a higher number over a lower number, such as 110/72. The higher number (systolic) is the pressure inside your arteries when your heart pumps. The lower number (diastolic) is the pressure inside your arteries when your heart relaxes. Ideally you want your blood pressure below 120/80. Hypertension forces your heart to work harder to pump blood. Your arteries may become narrow or stiff. Having untreated or uncontrolled hypertension can cause heart attack, stroke, kidney disease, and other problems. RISK FACTORS Some risk factors for high blood pressure are controllable. Others are not.  Risk factors you cannot control include:   Race. You may be at higher risk if you are African American.  Age. Risk increases with age.  Gender. Men are at higher risk than women before age 45 years. After age 65, women are at higher risk than men. Risk factors you can control include:  Not getting enough exercise or physical activity.  Being overweight.  Getting too much fat, sugar, calories, or salt in your diet.  Drinking too much alcohol. SIGNS AND SYMPTOMS Hypertension does not usually cause signs or symptoms. Extremely high blood pressure (hypertensive crisis) may cause headache, anxiety, shortness of breath, and nosebleed. DIAGNOSIS To check if you have hypertension, your health care provider will measure your blood pressure while you are seated, with your arm held at the level of your heart. It should be measured at least twice using the same arm. Certain conditions can cause a difference in blood pressure between your right and left arms. A blood pressure reading that is higher than normal on one occasion does not mean that you need treatment. If  it is not clear whether you have high blood pressure, you may be asked to return on a different day to have your blood pressure checked again. Or, you may be asked to monitor your blood pressure at home for 1 or more weeks. TREATMENT Treating high blood pressure includes making lifestyle changes and possibly taking medicine. Living a healthy lifestyle can help lower high blood pressure. You may need to change some of your habits. Lifestyle changes may include:  Following the DASH diet. This diet is high in fruits, vegetables, and whole grains. It is low in salt, red meat, and added sugars.  Keep your sodium intake below 2,300 mg per day.  Getting at least 30-45 minutes of aerobic exercise at least 4 times per week.  Losing weight if necessary.  Not smoking.  Limiting alcoholic beverages.  Learning ways to reduce stress. Your health care provider may prescribe medicine if lifestyle changes are not enough to get your blood pressure under control, and if one of the following is true:  You are 18-59 years of age and your systolic blood pressure is above 140.  You are 60 years of age or older, and your systolic blood pressure is above 150.  Your diastolic blood pressure is above 90.  You have diabetes, and your systolic blood pressure is over 140 or your diastolic blood pressure is over 90.  You have kidney disease and your blood pressure is above 140/90.  You have heart disease and your blood pressure is above 140/90. Your personal target blood pressure may vary depending on your medical conditions, your age, and other factors. HOME CARE INSTRUCTIONS    Have your blood pressure rechecked as directed by your health care provider.   Take medicines only as directed by your health care provider. Follow the directions carefully. Blood pressure medicines must be taken as prescribed. The medicine does not work as well when you skip doses. Skipping doses also puts you at risk for  problems.  Do not smoke.   Monitor your blood pressure at home as directed by your health care provider. SEEK MEDICAL CARE IF:   You think you are having a reaction to medicines taken.  You have recurrent headaches or feel dizzy.  You have swelling in your ankles.  You have trouble with your vision. SEEK IMMEDIATE MEDICAL CARE IF:  You develop a severe headache or confusion.  You have unusual weakness, numbness, or feel faint.  You have severe chest or abdominal pain.  You vomit repeatedly.  You have trouble breathing. MAKE SURE YOU:   Understand these instructions.  Will watch your condition.  Will get help right away if you are not doing well or get worse.   This information is not intended to replace advice given to you by your health care provider. Make sure you discuss any questions you have with your health care provider.   Document Released: 05/31/2005 Document Revised: 10/15/2014 Document Reviewed: 03/23/2013 Elsevier Interactive Patient Education 2016 Elsevier Inc.  

## 2015-08-29 ENCOUNTER — Encounter: Payer: Self-pay | Admitting: Physical Medicine & Rehabilitation

## 2015-08-29 ENCOUNTER — Ambulatory Visit (HOSPITAL_BASED_OUTPATIENT_CLINIC_OR_DEPARTMENT_OTHER): Payer: Self-pay | Admitting: Physical Medicine & Rehabilitation

## 2015-08-29 ENCOUNTER — Encounter: Payer: Medicaid Other | Attending: Physical Medicine & Rehabilitation

## 2015-08-29 VITALS — BP 104/67 | HR 80 | Resp 14

## 2015-08-29 DIAGNOSIS — I10 Essential (primary) hypertension: Secondary | ICD-10-CM | POA: Diagnosis not present

## 2015-08-29 DIAGNOSIS — G8110 Spastic hemiplegia affecting unspecified side: Secondary | ICD-10-CM

## 2015-08-29 DIAGNOSIS — Z8673 Personal history of transient ischemic attack (TIA), and cerebral infarction without residual deficits: Secondary | ICD-10-CM | POA: Diagnosis not present

## 2015-08-29 DIAGNOSIS — E785 Hyperlipidemia, unspecified: Secondary | ICD-10-CM | POA: Insufficient documentation

## 2015-08-29 DIAGNOSIS — F17210 Nicotine dependence, cigarettes, uncomplicated: Secondary | ICD-10-CM | POA: Insufficient documentation
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
## 2015-09-02 ENCOUNTER — Ambulatory Visit (INDEPENDENT_AMBULATORY_CARE_PROVIDER_SITE_OTHER): Payer: Medicaid Other | Admitting: Nurse Practitioner

## 2015-09-02 ENCOUNTER — Encounter: Payer: Self-pay | Admitting: Nurse Practitioner

## 2015-09-02 VITALS — BP 120/74 | HR 68 | Resp 14 | Ht 72.0 in | Wt 169.0 lb

## 2015-09-02 DIAGNOSIS — G8191 Hemiplegia, unspecified affecting right dominant side: Secondary | ICD-10-CM | POA: Diagnosis not present

## 2015-09-02 DIAGNOSIS — I69922 Dysarthria following unspecified cerebrovascular disease: Secondary | ICD-10-CM

## 2015-09-02 DIAGNOSIS — I10 Essential (primary) hypertension: Secondary | ICD-10-CM

## 2015-09-02 DIAGNOSIS — I69322 Dysarthria following cerebral infarction: Secondary | ICD-10-CM

## 2015-09-02 DIAGNOSIS — I639 Cerebral infarction, unspecified: Secondary | ICD-10-CM

## 2015-09-02 NOTE — Patient Instructions (Signed)
Management of stroke risk factors Continue aspirin for secondary stroke prevention Continue Lipitor for secondary stroke prevention, LDL 91 Systolic blood pressure less than 130 today's reading 120/74 Once therapies conclude continue home exercise program Stop smoking Stop alcohol Follow-up with me in 3 months Handicap sticker

## 2015-09-02 NOTE — Progress Notes (Signed)
I have reviewed and agreed above plan. 
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
## 2015-09-03 ENCOUNTER — Telehealth: Payer: Self-pay

## 2015-09-03 NOTE — Telephone Encounter (Signed)
Gerald [name] from [facility]- needs verbal orders for 2 additional visits next week to work on dysphagia.She can be reached at [phone]

## 2015-09-04 NOTE — Telephone Encounter (Signed)
Left message with Ebony Hail to approve verbal orders.

## 2015-09-05 ENCOUNTER — Telehealth: Payer: Self-pay

## 2015-09-05 NOTE — Telephone Encounter (Signed)
Kendra-PT from [facility]- called requesting verbal orders for 2 wk 1 to work on gait training. Verbal orders approved.

## 2015-09-09 ENCOUNTER — Telehealth: Payer: Self-pay | Admitting: *Deleted

## 2015-09-09 NOTE — Telephone Encounter (Signed)
[facility] calling to extend HHOT 1 wk 1.  Approval given.

## 2015-09-11 ENCOUNTER — Ambulatory Visit (HOSPITAL_BASED_OUTPATIENT_CLINIC_OR_DEPARTMENT_OTHER): Payer: Self-pay | Admitting: Physical Medicine & Rehabilitation

## 2015-09-11 ENCOUNTER — Encounter: Payer: Self-pay | Admitting: Physical Medicine & Rehabilitation

## 2015-09-11 VITALS — BP 155/79 | HR 72 | Resp 14

## 2015-09-11 DIAGNOSIS — G8110 Spastic hemiplegia affecting unspecified side: Secondary | ICD-10-CM | POA: Diagnosis not present

## 2015-09-11 NOTE — Progress Notes (Signed)
Botox Injection for spasticity using needle EMG guidance  Dilution: 50 Units/ml Indication: Severe spasticity which interferes with ADL,mobility and/or  hygiene and is unresponsive to medication management and other conservative care Informed consent was obtained after describing risks and benefits of the procedure with the patient. This includes bleeding, bruising, infection, excessive weakness, or medication side effects. A REMS form is on file and signed. Needle: 25g 2inch needle electrode Number of units per muscle 200 units right biceps 50 units right VMO, 50 units right rectus femoris, 50 units right vastus intermedius, 50 units right vastus lateralis  All injections were done after obtaining appropriate EMG activity and after negative drawback for blood. The patient tolerated the procedure well. Post procedure instructions were given. A followup appointment was made.

## 2015-09-11 NOTE — Patient Instructions (Signed)

## 2015-09-16 ENCOUNTER — Telehealth: Payer: Self-pay | Admitting: Family Medicine

## 2015-09-16 ENCOUNTER — Telehealth: Payer: Self-pay | Admitting: *Deleted

## 2015-09-16 NOTE — Telephone Encounter (Signed)
Per Dr. Jarold Song patient needs to come in sooner than his next scheduled appointment on the 19th.  Gerald [name] is calling patient so that he can come in on Thursday.  RN called [name], RN to update her on plan.

## 2015-09-16 NOTE — Telephone Encounter (Signed)
Gerald [name] from [facility] called for verbal orders for ot,pt, and st.  Returned call and gave verbal orders per office protocol

## 2015-09-16 NOTE — Telephone Encounter (Signed)
Beth from [facility] called to let pt. PCP know that his BP has been high 150/90. Pt. Was recently in the ED for a stroke he had. Please f/u

## 2015-09-17 ENCOUNTER — Telehealth: Payer: Self-pay | Admitting: General Practice

## 2015-09-17 NOTE — Telephone Encounter (Signed)
Patient has appointment to see MD tomorrow.  RN will route this request to her for consideration tomorrow.

## 2015-09-17 NOTE — Telephone Encounter (Signed)
Pt's nurse would like to change wound orders to Cleanse with saline apply silver calcium alginate and cover with dressing and change every other day.........Marland Kitchenplease follow up with nurse.

## 2015-09-18 ENCOUNTER — Encounter: Payer: Self-pay | Admitting: Family Medicine

## 2015-09-18 ENCOUNTER — Ambulatory Visit: Payer: Medicaid Other | Attending: Family Medicine | Admitting: Family Medicine

## 2015-09-18 VITALS — BP 148/90 | HR 94 | Temp 97.9°F | Resp 15 | Ht 72.0 in | Wt 169.0 lb

## 2015-09-18 DIAGNOSIS — R471 Dysarthria and anarthria: Secondary | ICD-10-CM | POA: Diagnosis not present

## 2015-09-18 DIAGNOSIS — Z79899 Other long term (current) drug therapy: Secondary | ICD-10-CM | POA: Insufficient documentation

## 2015-09-18 DIAGNOSIS — Z8673 Personal history of transient ischemic attack (TIA), and cerebral infarction without residual deficits: Secondary | ICD-10-CM | POA: Diagnosis not present

## 2015-09-18 DIAGNOSIS — G8110 Spastic hemiplegia affecting unspecified side: Secondary | ICD-10-CM

## 2015-09-18 DIAGNOSIS — A4902 Methicillin resistant Staphylococcus aureus infection, unspecified site: Secondary | ICD-10-CM

## 2015-09-18 DIAGNOSIS — F17210 Nicotine dependence, cigarettes, uncomplicated: Secondary | ICD-10-CM | POA: Insufficient documentation

## 2015-09-18 DIAGNOSIS — I63322 Cerebral infarction due to thrombosis of left anterior cerebral artery: Secondary | ICD-10-CM

## 2015-09-18 DIAGNOSIS — I10 Essential (primary) hypertension: Secondary | ICD-10-CM

## 2015-09-18 MED ORDER — METOPROLOL TARTRATE 25 MG PO TABS: 25.0000 mg | ORAL_TABLET | Freq: Two times a day (BID) | ORAL | Status: DC

## 2015-09-18 MED FILL — ?METOPROLOL 25 MG TABLET: 25 | 30 days supply | Qty: 60 | Fill #0

## 2015-09-18 NOTE — Patient Instructions (Signed)
Hypertension Hypertension, commonly called high blood pressure, is when the force of blood pumping through your arteries is too strong. Your arteries are the blood vessels that carry blood from your heart throughout your body. A blood pressure reading consists of a higher number over a lower number, such as 110/72. The higher number (systolic) is the pressure inside your arteries when your heart pumps. The lower number (diastolic) is the pressure inside your arteries when your heart relaxes. Ideally you want your blood pressure below 120/80. Hypertension forces your heart to work harder to pump blood. Your arteries may become narrow or stiff. Having untreated or uncontrolled hypertension can cause heart attack, stroke, kidney disease, and other problems. RISK FACTORS Some risk factors for high blood pressure are controllable. Others are not.  Risk factors you cannot control include:   Race. You may be at higher risk if you are African American.  Age. Risk increases with age.  Gender. Men are at higher risk than women before age 45 years. After age 65, women are at higher risk than men. Risk factors you can control include:  Not getting enough exercise or physical activity.  Being overweight.  Getting too much fat, sugar, calories, or salt in your diet.  Drinking too much alcohol. SIGNS AND SYMPTOMS Hypertension does not usually cause signs or symptoms. Extremely high blood pressure (hypertensive crisis) may cause headache, anxiety, shortness of breath, and nosebleed. DIAGNOSIS To check if you have hypertension, your health care provider will measure your blood pressure while you are seated, with your arm held at the level of your heart. It should be measured at least twice using the same arm. Certain conditions can cause a difference in blood pressure between your right and left arms. A blood pressure reading that is higher than normal on one occasion does not mean that you need treatment. If  it is not clear whether you have high blood pressure, you may be asked to return on a different day to have your blood pressure checked again. Or, you may be asked to monitor your blood pressure at home for 1 or more weeks. TREATMENT Treating high blood pressure includes making lifestyle changes and possibly taking medicine. Living a healthy lifestyle can help lower high blood pressure. You may need to change some of your habits. Lifestyle changes may include:  Following the DASH diet. This diet is high in fruits, vegetables, and whole grains. It is low in salt, red meat, and added sugars.  Keep your sodium intake below 2,300 mg per day.  Getting at least 30-45 minutes of aerobic exercise at least 4 times per week.  Losing weight if necessary.  Not smoking.  Limiting alcoholic beverages.  Learning ways to reduce stress. Your health care provider may prescribe medicine if lifestyle changes are not enough to get your blood pressure under control, and if one of the following is true:  You are 18-59 years of age and your systolic blood pressure is above 140.  You are 60 years of age or older, and your systolic blood pressure is above 150.  Your diastolic blood pressure is above 90.  You have diabetes, and your systolic blood pressure is over 140 or your diastolic blood pressure is over 90.  You have kidney disease and your blood pressure is above 140/90.  You have heart disease and your blood pressure is above 140/90. Your personal target blood pressure may vary depending on your medical conditions, your age, and other factors. HOME CARE INSTRUCTIONS    Have your blood pressure rechecked as directed by your health care provider.   Take medicines only as directed by your health care provider. Follow the directions carefully. Blood pressure medicines must be taken as prescribed. The medicine does not work as well when you skip doses. Skipping doses also puts you at risk for  problems.  Do not smoke.   Monitor your blood pressure at home as directed by your health care provider. SEEK MEDICAL CARE IF:   You think you are having a reaction to medicines taken.  You have recurrent headaches or feel dizzy.  You have swelling in your ankles.  You have trouble with your vision. SEEK IMMEDIATE MEDICAL CARE IF:  You develop a severe headache or confusion.  You have unusual weakness, numbness, or feel faint.  You have severe chest or abdominal pain.  You vomit repeatedly.  You have trouble breathing. MAKE SURE YOU:   Understand these instructions.  Will watch your condition.  Will get help right away if you are not doing well or get worse.   This information is not intended to replace advice given to you by your health care provider. Make sure you discuss any questions you have with your health care provider.   Document Released: 05/31/2005 Document Revised: 10/15/2014 Document Reviewed: 03/23/2013 Elsevier Interactive Patient Education 2016 Elsevier Inc.  

## 2015-09-18 NOTE — Progress Notes (Signed)
HTN follow up Has been taking medications as prescribed Does not need refills States he no longer smokes, drinks ETOH, or uses drugs

## 2015-09-19 MED FILL — AMLODIPINE BESYLATE 10 MG T: 10 | 30 days supply | Qty: 30 | Fill #1

## 2015-09-19 MED FILL — BACLOFEN 10 MG TABLET: 10 | 30 days supply | Qty: 90 | Fill #1

## 2015-09-19 MED FILL — ATORVASTATIN 20 MG TABLET: 20 | 30 days supply | Qty: 30 | Fill #1

## 2015-09-19 MED FILL — tiZANidine HCL 4 MG CAPS: 4 | 30 days supply | Qty: 90 | Fill #1

## 2015-09-19 MED FILL — LISINOPRIL 40 MG TABLET: 40 | 30 days supply | Qty: 30 | Fill #1
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
## 2015-09-19 NOTE — Telephone Encounter (Signed)
Nurse from [facility] called to clarify orders for wound care.

## 2015-09-19 NOTE — Telephone Encounter (Signed)
Okay to give verbal orders to proceed with [facility] wound care as stated below.

## 2015-09-23 ENCOUNTER — Telehealth: Payer: Self-pay | Admitting: General Practice

## 2015-09-23 NOTE — Telephone Encounter (Signed)
Vitals: BP 152/87 is reading higher today...no headache, no dizziness, patient following diet regimen

## 2015-09-23 NOTE — Telephone Encounter (Signed)
MA spoke with Nurse Beth and provided VO for wound care per Dr. Jarold Song. No further questions or concerns at this time.

## 2015-09-23 NOTE — Telephone Encounter (Signed)
Speech therapist called [place] about patient BP. Patient sister is aware of the new medication Metoprolol he was placed on from [facility] 09/18/15. Patient has a f/up visit for placement on new BP medication. Patient will then be evaluated at that [facility].

## 2015-09-25 ENCOUNTER — Telehealth: Payer: Self-pay

## 2015-09-25 NOTE — Telephone Encounter (Signed)
Rn call patients number he left. Pts sister answer the phone. PT does not have a DPR form on file for [facility]. Rn just explain to sister that her brother handicap form was ready for pick up.

## 2015-09-30 ENCOUNTER — Telehealth: Payer: Self-pay | Admitting: Family Medicine

## 2015-09-30 NOTE — Telephone Encounter (Signed)
BP 163/98, no other signs of stroke or distress, possibly due to poor diet choices.Marland KitchenMarland KitchenMarland Kitchen

## 2015-10-01 ENCOUNTER — Ambulatory Visit: Payer: Self-pay | Admitting: Family Medicine

## 2015-10-03 ENCOUNTER — Telehealth: Payer: Self-pay | Admitting: Family Medicine

## 2015-10-03 NOTE — Telephone Encounter (Signed)
This Case Manager placed return call to [place], [facility] RN with [facility]. She indicated she was requesting orders to continue seeing patient for wound care and continued blood pressure monitoring. Verbal order given. She indicated written order will be faxed to Dr. Jarold Song to sign. No additional needs/concerns identified.

## 2015-10-03 NOTE — Telephone Encounter (Signed)
Beth from [facility] called requesting Orders to continue seeing patient.

## 2015-10-03 NOTE — Telephone Encounter (Signed)
Patient has an appt with Dr. Jarold Song on Monday, 24th. Patient nurse called from [place] concerning patient BP. Patient Aunt Christean Grief told me patient was not available. She stated that it was a concern from his nurse.

## 2015-10-06 ENCOUNTER — Ambulatory Visit: Payer: Medicaid Other | Attending: Family Medicine | Admitting: Family Medicine

## 2015-10-06 ENCOUNTER — Telehealth: Payer: Self-pay

## 2015-10-06 ENCOUNTER — Encounter: Payer: Self-pay | Admitting: Family Medicine

## 2015-10-06 VITALS — BP 137/88 | HR 76 | Temp 98.1°F | Resp 16 | Ht 72.0 in | Wt 185.6 lb

## 2015-10-06 DIAGNOSIS — Z7982 Long term (current) use of aspirin: Secondary | ICD-10-CM | POA: Diagnosis not present

## 2015-10-06 DIAGNOSIS — I10 Essential (primary) hypertension: Secondary | ICD-10-CM | POA: Diagnosis not present

## 2015-10-06 DIAGNOSIS — Z720 Tobacco use: Secondary | ICD-10-CM | POA: Diagnosis not present

## 2015-10-06 NOTE — Progress Notes (Signed)
Subjective:  Patient ID: Gerald [name], male    DOB: July 02, 1960  Age: 55 y.o. MRN: [id number]  CC: Follow-up and Hypertension   HPI Gerald [name] is a 55 year old man with  Medical history significant for hypertension, tobacco abuse, previous CVA, recent hospitalization for CVA due to infarct in the left ACA territory with right hemiparesis and dysarthria who comes into the clinic for a follow-up of blood pressure after an increase in his dose of metoprolol.  I have reviewed his blood pressure log from home which reveals blood pressures in the 128/88-148/96 range and a single blood pressure of 150/80. He has been compliant with his medications and is needing a refill of his blood pressure medications. He is closely followed by rehabilitation medicine.   Past Medical History  Diagnosis Date  . Hypertension   . Alcohol abuse   . Tobacco use   . Cocaine abuse 2014  . Stroke [facility]) 2014    denies residual on 07/03/2015  . Stroke ([place]) 07/02/2015    "now weak on right side; speech problems" (07/03/2015)     Outpatient Prescriptions Prior to Visit  Medication Sig Dispense Refill  . amLODipine (NORVASC) 10 MG tablet Take 1 tablet (10 mg total) by mouth daily. 30 tablet 3  . aspirin 325 MG EC tablet Take 1 tablet (325 mg total) by mouth daily. 30 tablet 3  . atorvastatin (LIPITOR) 20 MG tablet Take 1 tablet (20 mg total) by mouth daily at 6 PM. 30 tablet 3  . baclofen (LIORESAL) 10 MG tablet Take 1 tablet (10 mg total) by mouth 3 (three) times daily. 90 each 3  . lisinopril (PRINIVIL,ZESTRIL) 40 MG tablet Take 1 tablet (40 mg total) by mouth daily. 30 tablet 3  . metoprolol tartrate (LOPRESSOR) 25 MG tablet Take 1 tablet (25 mg total) by mouth 2 (two) times daily. 60 tablet 3  . tiZANidine (ZANAFLEX) 4 MG tablet Take 1 tablet (4 mg total) by mouth 3 (three) times daily. 90 tablet 3   No facility-administered medications prior to visit.    ROS Review of Systems Constitutional:  Negative for activity change and appetite change.  HENT: Negative for sinus pressure and sore throat.   Eyes: Negative for visual disturbance.  Respiratory: Negative for cough, chest tightness and shortness of breath.   Cardiovascular: Negative for chest pain and leg swelling.  Gastrointestinal: Negative for abdominal pain, diarrhea, constipation and abdominal distention.  Endocrine: Negative.   Genitourinary: Negative for dysuria.  Musculoskeletal: Stiffness of right knee joint and right hand Skin: Negative for rash.  Allergic/Immunologic: Negative.   Neurological:Positive for right sided weakness,negative for light-headedness and numbness.  Psychiatric/Behavioral: Negative for suicidal ideas and dysphoric mood.   Objective:  BP 137/88 mmHg  Pulse 76  Temp(Src) 98.1 F (36.7 C) (Oral)  Resp 16  Ht 6' (1.829 m)  Wt 185 lb 9.6 oz (84.188 kg)  BMI 25.17 kg/m2  SpO2 97%  BP/Weight 10/06/2015 09/18/2015 [id number]  Systolic BP 0000000 [id number]  Diastolic BP 88 90 79  Wt. (Lbs) 185.6 169 -  BMI 25.17 22.92 -      Physical Exam Constitutional: He is oriented to person, place, and time. He appears well-developed and well-nourished.  Appears comfortable sitting in a wheelchair  Cardiovascular: Normal rate, normal heart sounds and intact distal pulses.   No murmur heard. Pulmonary/Chest: Effort normal and breath sounds normal. He has no wheezes. He has no rales. He exhibits no tenderness.  Abdominal: Soft.  Bowel sounds are normal. He exhibits no distension and no mass. There is no tenderness.  Musculoskeletal: Decreased range of motion on joints of the right side   Lipid Panel     Component Value Date/Time   CHOL 172 07/03/2015 0311   TRIG 44 07/03/2015 0311   HDL 72 07/03/2015 0311   CHOLHDL 2.4 07/03/2015 0311   VLDL 9 07/03/2015 0311   LDLCALC 91 07/03/2015 0311      Assessment & Plan:   1. Essential hypertension Controlled Continue amlodipine, lisinopril and  metoprolol Low-sodium, DASH diet. Advised to keep blood pressure log. We'll review at next visit.   No orders of the defined types were placed in this encounter.    Follow-up: Return in about 6 weeks (around 11/17/2015) for follow up of hypertension.   Arnoldo Morale MD

## 2015-10-06 NOTE — Telephone Encounter (Signed)
Luan Pulling with [facility]- called to get verbal orders to re-certify pt to continue speech therapy once a week. Medicaid is pending to allow him visits. Verbal orders approved.

## 2015-10-06 NOTE — Progress Notes (Signed)
Patient's here for f/up HTN.  Patient denies any pain today.  Refill of Metoprolol.

## 2015-10-06 NOTE — Patient Instructions (Signed)
Hypertension Hypertension, commonly called high blood pressure, is when the force of blood pumping through your arteries is too strong. Your arteries are the blood vessels that carry blood from your heart throughout your body. A blood pressure reading consists of a higher number over a lower number, such as 110/72. The higher number (systolic) is the pressure inside your arteries when your heart pumps. The lower number (diastolic) is the pressure inside your arteries when your heart relaxes. Ideally you want your blood pressure below 120/80. Hypertension forces your heart to work harder to pump blood. Your arteries may become narrow or stiff. Having untreated or uncontrolled hypertension can cause heart attack, stroke, kidney disease, and other problems. RISK FACTORS Some risk factors for high blood pressure are controllable. Others are not.  Risk factors you cannot control include:   Race. You may be at higher risk if you are African American.  Age. Risk increases with age.  Gender. Men are at higher risk than women before age 45 years. After age 65, women are at higher risk than men. Risk factors you can control include:  Not getting enough exercise or physical activity.  Being overweight.  Getting too much fat, sugar, calories, or salt in your diet.  Drinking too much alcohol. SIGNS AND SYMPTOMS Hypertension does not usually cause signs or symptoms. Extremely high blood pressure (hypertensive crisis) may cause headache, anxiety, shortness of breath, and nosebleed. DIAGNOSIS To check if you have hypertension, your health care provider will measure your blood pressure while you are seated, with your arm held at the level of your heart. It should be measured at least twice using the same arm. Certain conditions can cause a difference in blood pressure between your right and left arms. A blood pressure reading that is higher than normal on one occasion does not mean that you need treatment. If  it is not clear whether you have high blood pressure, you may be asked to return on a different day to have your blood pressure checked again. Or, you may be asked to monitor your blood pressure at home for 1 or more weeks. TREATMENT Treating high blood pressure includes making lifestyle changes and possibly taking medicine. Living a healthy lifestyle can help lower high blood pressure. You may need to change some of your habits. Lifestyle changes may include:  Following the DASH diet. This diet is high in fruits, vegetables, and whole grains. It is low in salt, red meat, and added sugars.  Keep your sodium intake below 2,300 mg per day.  Getting at least 30-45 minutes of aerobic exercise at least 4 times per week.  Losing weight if necessary.  Not smoking.  Limiting alcoholic beverages.  Learning ways to reduce stress. Your health care provider may prescribe medicine if lifestyle changes are not enough to get your blood pressure under control, and if one of the following is true:  You are 18-59 years of age and your systolic blood pressure is above 140.  You are 60 years of age or older, and your systolic blood pressure is above 150.  Your diastolic blood pressure is above 90.  You have diabetes, and your systolic blood pressure is over 140 or your diastolic blood pressure is over 90.  You have kidney disease and your blood pressure is above 140/90.  You have heart disease and your blood pressure is above 140/90. Your personal target blood pressure may vary depending on your medical conditions, your age, and other factors. HOME CARE INSTRUCTIONS    Have your blood pressure rechecked as directed by your health care provider.   Take medicines only as directed by your health care provider. Follow the directions carefully. Blood pressure medicines must be taken as prescribed. The medicine does not work as well when you skip doses. Skipping doses also puts you at risk for  problems.  Do not smoke.   Monitor your blood pressure at home as directed by your health care provider. SEEK MEDICAL CARE IF:   You think you are having a reaction to medicines taken.  You have recurrent headaches or feel dizzy.  You have swelling in your ankles.  You have trouble with your vision. SEEK IMMEDIATE MEDICAL CARE IF:  You develop a severe headache or confusion.  You have unusual weakness, numbness, or feel faint.  You have severe chest or abdominal pain.  You vomit repeatedly.  You have trouble breathing. MAKE SURE YOU:   Understand these instructions.  Will watch your condition.  Will get help right away if you are not doing well or get worse.   This information is not intended to replace advice given to you by your health care provider. Make sure you discuss any questions you have with your health care provider.   Document Released: 05/31/2005 Document Revised: 10/15/2014 Document Reviewed: 03/23/2013 Elsevier Interactive Patient Education 2016 Elsevier Inc.  

## 2015-10-14 ENCOUNTER — Telehealth: Payer: Self-pay | Admitting: Family Medicine

## 2015-10-14 NOTE — Telephone Encounter (Signed)
Nurse believes patient is suffering from depression and would benefit from medications...Marland KitchenMarland Kitchen Please discuss with patient possible treatment.Marland KitchenMarland KitchenMarland Kitchen

## 2015-10-14 NOTE — Telephone Encounter (Signed)
He will need to come in with his caregiver and the depression would have to be discussed at an office visit with the patient's consent prior to placing him on medications.

## 2015-10-15 ENCOUNTER — Telehealth: Payer: Self-pay | Admitting: *Deleted

## 2015-10-15 NOTE — Telephone Encounter (Signed)
Calling to request additional visits 2wk1 and 1wk1 for Gerald [name].  Approval given.

## 2015-10-22 ENCOUNTER — Telehealth: Payer: Self-pay | Admitting: *Deleted

## 2015-10-22 ENCOUNTER — Other Ambulatory Visit: Payer: Self-pay | Admitting: *Deleted

## 2015-10-22 DIAGNOSIS — G8110 Spastic hemiplegia affecting unspecified side: Secondary | ICD-10-CM

## 2015-10-22 MED FILL — AMLODIPINE BESYLATE 10 MG T: 10 | 30 days supply | Qty: 30 | Fill #2

## 2015-10-22 MED FILL — METOPROLOL TARTRATE 25 MG T: 25 | 30 days supply | Qty: 60 | Fill #1

## 2015-10-22 MED FILL — LISINOPRIL 40 MG TABLET: 40 | 30 days supply | Qty: 30 | Fill #2

## 2015-10-22 NOTE — Telephone Encounter (Signed)
May order outpatient PT, OT, speech therapy

## 2015-10-22 NOTE — Telephone Encounter (Signed)
Curt Bears from [facility] called and asked if we could expedite referrals to get patient into outpatient neurorehab for OT, PT, and ST.  Referral orders submitted to [facility]. Final approval will come from Dr. Letta Pate

## 2015-10-24 ENCOUNTER — Ambulatory Visit: Payer: Self-pay | Admitting: Physical Medicine & Rehabilitation

## 2015-10-24 ENCOUNTER — Ambulatory Visit: Payer: Self-pay

## 2015-10-31 ENCOUNTER — Encounter: Payer: Self-pay | Admitting: Physical Medicine & Rehabilitation

## 2015-10-31 ENCOUNTER — Encounter: Payer: Medicaid Other | Attending: Physical Medicine & Rehabilitation

## 2015-10-31 ENCOUNTER — Ambulatory Visit (HOSPITAL_BASED_OUTPATIENT_CLINIC_OR_DEPARTMENT_OTHER): Payer: Medicaid Other | Admitting: Physical Medicine & Rehabilitation

## 2015-10-31 VITALS — BP 107/69 | HR 71 | Resp 14

## 2015-10-31 DIAGNOSIS — I10 Essential (primary) hypertension: Secondary | ICD-10-CM | POA: Diagnosis not present

## 2015-10-31 DIAGNOSIS — G8110 Spastic hemiplegia affecting unspecified side: Secondary | ICD-10-CM | POA: Insufficient documentation

## 2015-10-31 DIAGNOSIS — E785 Hyperlipidemia, unspecified: Secondary | ICD-10-CM | POA: Insufficient documentation

## 2015-10-31 DIAGNOSIS — Z8673 Personal history of transient ischemic attack (TIA), and cerebral infarction without residual deficits: Secondary | ICD-10-CM | POA: Diagnosis not present

## 2015-10-31 DIAGNOSIS — F17210 Nicotine dependence, cigarettes, uncomplicated: Secondary | ICD-10-CM | POA: Insufficient documentation

## 2015-10-31 NOTE — Patient Instructions (Signed)
We will reduce the dose of Botox in the arm and increased the dose of Botox in the thigh
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
## 2015-11-03 ENCOUNTER — Telehealth: Payer: Self-pay | Admitting: Family Medicine

## 2015-11-03 NOTE — Telephone Encounter (Signed)
Beth from [facility] called to let pt. PCP know that pt. BP has been 140/90. Pt. Also has an open area on his left side.  Please f/u

## 2015-11-06 ENCOUNTER — Ambulatory Visit: Payer: Medicaid Other | Admitting: Occupational Therapy

## 2015-11-06 ENCOUNTER — Ambulatory Visit: Payer: Medicaid Other | Attending: Physical Medicine & Rehabilitation | Admitting: Physical Therapy

## 2015-11-06 ENCOUNTER — Ambulatory Visit: Payer: Medicaid Other | Admitting: Speech Pathology

## 2015-11-06 ENCOUNTER — Encounter: Payer: Self-pay | Admitting: Physical Therapy

## 2015-11-06 VITALS — BP 172/101 | HR 69

## 2015-11-06 DIAGNOSIS — M79601 Pain in right arm: Secondary | ICD-10-CM

## 2015-11-06 DIAGNOSIS — I69351 Hemiplegia and hemiparesis following cerebral infarction affecting right dominant side: Secondary | ICD-10-CM | POA: Diagnosis present

## 2015-11-06 DIAGNOSIS — R278 Other lack of coordination: Secondary | ICD-10-CM

## 2015-11-06 DIAGNOSIS — R2689 Other abnormalities of gait and mobility: Secondary | ICD-10-CM | POA: Insufficient documentation

## 2015-11-06 DIAGNOSIS — G8111 Spastic hemiplegia affecting right dominant side: Secondary | ICD-10-CM | POA: Insufficient documentation

## 2015-11-06 DIAGNOSIS — M6281 Muscle weakness (generalized): Secondary | ICD-10-CM | POA: Insufficient documentation

## 2015-11-06 DIAGNOSIS — R482 Apraxia: Secondary | ICD-10-CM | POA: Diagnosis present

## 2015-11-06 DIAGNOSIS — R4701 Aphasia: Secondary | ICD-10-CM | POA: Insufficient documentation

## 2015-11-06 MED FILL — ?ATORVASTATIN 20 MG TABLET: 20 | 30 days supply | Qty: 30 | Fill #2

## 2015-11-06 MED FILL — tiZANidine HCL 4 MG CAPS: 4 | 30 days supply | Qty: 90 | Fill #2
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
## 2015-11-06 NOTE — Patient Instructions (Signed)
Tips for Talking with People who have Aphasia  . Say one thing at a time . Don't  rush - slow down, be patient . Reduce background noise . Relax - be natural . Use pen and paper . Write down key words . Draw diagrams or pictures . Don't pretend you understand . Ask what helps . Recap - check you both understand   Describing words  What group does it belong to?  What do I use it for?  Where can I find it?  What does it LOOK like?  What other words go with it?  What is the 1st sound of the word?  Many Ways to Communicate  Describe it Write it Draw it Gesture it Use related words  There's an App for that: Family Feud, Heads up, Stop-fun categories, What if, Noe Gens  Provided by: Barnabas Lister [place], [phone]
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
## 2015-11-17 ENCOUNTER — Encounter: Payer: Self-pay | Admitting: Occupational Therapy

## 2015-11-17 ENCOUNTER — Ambulatory Visit: Payer: Medicaid Other | Attending: Physical Medicine & Rehabilitation | Admitting: Occupational Therapy

## 2015-11-17 DIAGNOSIS — M79601 Pain in right arm: Secondary | ICD-10-CM | POA: Insufficient documentation

## 2015-11-17 DIAGNOSIS — R2689 Other abnormalities of gait and mobility: Secondary | ICD-10-CM | POA: Insufficient documentation

## 2015-11-17 DIAGNOSIS — R4701 Aphasia: Secondary | ICD-10-CM | POA: Insufficient documentation

## 2015-11-17 DIAGNOSIS — R278 Other lack of coordination: Secondary | ICD-10-CM | POA: Insufficient documentation

## 2015-11-17 DIAGNOSIS — I69351 Hemiplegia and hemiparesis following cerebral infarction affecting right dominant side: Secondary | ICD-10-CM | POA: Diagnosis present

## 2015-11-17 DIAGNOSIS — M6281 Muscle weakness (generalized): Secondary | ICD-10-CM | POA: Diagnosis present

## 2015-11-17 DIAGNOSIS — G8111 Spastic hemiplegia affecting right dominant side: Secondary | ICD-10-CM | POA: Diagnosis present

## 2015-11-17 NOTE — Patient Instructions (Addendum)
1) Flexors Stretch, Sitting (Passive) (link hands together and place on towel on table)    Sit, forearm on table, link arms together and place on towel. Bend from waist and slide forearms forward along table. Hold 3-5 seconds.  Repeat 5-10 times per session. Do 2-3 sessions per day. Slide forearms forward, and to the sides, hold each position and repeat 5-10 times each.  Abduction (Passive)    2) With arm out to side, resting on table, lower head toward arm, keeping trunk away from table. Hold 3-5 seconds. Repeat 5-10 times. Do 2-3 sessions per day.   3) Do cane exercises lying down for shoulder flexion and ABD. Hold each exercise for 3-5 seconds and repeat 10-15 times 2-3 times/day.  4) Practice grasp and release with right hand - picking up/stacking plastic cups, spices etc as discussed in clinic today.
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
## 2015-11-18 ENCOUNTER — Ambulatory Visit: Payer: Medicaid Other | Admitting: Physical Therapy

## 2015-11-18 VITALS — BP 143/80 | HR 60

## 2015-11-18 DIAGNOSIS — I69351 Hemiplegia and hemiparesis following cerebral infarction affecting right dominant side: Secondary | ICD-10-CM

## 2015-11-18 DIAGNOSIS — R278 Other lack of coordination: Secondary | ICD-10-CM | POA: Diagnosis not present
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
## 2015-11-18 NOTE — Patient Instructions (Signed)
Dorsiflexion: Stretch - Heel Cord / Gastrocnemius    Position (A) Patient: Lie or sit with knee straight. Helper: Cup right heel. Make sure grip is firm. Motion (B) - Helper uses forearm to apply pressure to entire sole of foot, stretching foot toward shin. CAUTION: Stretch should be felt in calf. Do not allow foot to twist. Hold 30 seconds. Repeat 3 times. Repeat with other leg. Do 2 sessions per day.   Copyright  VHI. All rights reserved.    Quads / HF, Supine    Lie near edge of bed, one leg bent, foot flat on bed. Other leg hanging over edge, relaxed, thigh resting entirely on bed. Bend hanging knee backward keeping thigh in contact with bed. The more you bend the knee the stronger you will feel the stretch.  Remember it should be a gentle stretch.  Hold 30 seconds.  Repeat 3 times per session. Do 2 sessions per day.  Copyright  VHI. All rights reserved.   CAREGIVER ASSISTED: Hamstrings - Supine    Caregiver holds right leg at ankle and over knee; raises leg straight. Hold 30 seconds. Repeat 3 times per session.  Do 2 sessions a day.   Copyright  VHI. All rights reserved.     Bridging    Slowly raise buttocks from floor, keeping stomach tight.  Hold for 5 seconds.   Repeat 10 times per set. Do 1 sets per session. Do 2 sessions per day.  http://orth.exer.us/1096   Copyright  VHI. All rights reserved.   Adduction / Internal Rotation: Isometric (Supine)    Position Patient: Bend right leg, foot flat on bed. Helper: Stabilize hip. Place other hand on inside of knee. Motion - Cue patient to press knee inward toward helper's hand.  Helper blocks movement if patient able to press against hand. Hold 5 seconds. Relax. Repeat 10 times. Do 2 sessions per day. Variation: Perform with thigh leaning: inward outward  Copyright  VHI. All rights reserved.    CAREGIVER ASSISTED: Adductors    Caregiver moves right leg out to side, keeping toes pointed up. Return to  midline position.  Have patient assist as much as able.  Repeat 10 times 2 times a day.   Copyright  VHI. All rights reserved.   CAREGIVER ASSISTED: Hip / Knee Flexion    Caregiver holds right leg at knee and heel; raises knee to chest. Have patient assist as much as possible.  Repeat 10 times 2 times a day.   Copyright  VHI. All rights reserved.      Functional Quadriceps: Sit to Stand    Sit on edge of chair, feet flat on floor. Stand upright with assist of caregiver, extending knees fully. Repeat 10 times per set. Do 1 sets per session. Do 2 sessions per day.  http://orth.exer.us/734   Copyright  VHI. All rights reserved.

## 2015-11-19 ENCOUNTER — Ambulatory Visit: Payer: Medicaid Other | Admitting: Speech Pathology

## 2015-11-19 ENCOUNTER — Ambulatory Visit: Payer: Medicaid Other | Admitting: Physical Therapy

## 2015-11-19 DIAGNOSIS — R278 Other lack of coordination: Secondary | ICD-10-CM | POA: Diagnosis not present

## 2015-11-19 DIAGNOSIS — G8111 Spastic hemiplegia affecting right dominant side: Secondary | ICD-10-CM

## 2015-11-19 DIAGNOSIS — R2689 Other abnormalities of gait and mobility: Secondary | ICD-10-CM

## 2015-11-19 DIAGNOSIS — R4701 Aphasia: Secondary | ICD-10-CM

## 2015-11-19 DIAGNOSIS — I69351 Hemiplegia and hemiparesis following cerebral infarction affecting right dominant side: Secondary | ICD-10-CM
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
## 2015-11-20 MED FILL — METOPROLOL TARTRATE 25 MG T: 25 | 30 days supply | Qty: 60 | Fill #2

## 2015-11-20 MED FILL — BACLOFEN 10 MG TABLET: 10 | 30 days supply | Qty: 90 | Fill #2

## 2015-11-20 MED FILL — LISINOPRIL 40 MG TABLET: 40 | 30 days supply | Qty: 30 | Fill #3

## 2015-11-25 ENCOUNTER — Encounter: Payer: Medicaid Other | Admitting: Occupational Therapy

## 2015-11-25 ENCOUNTER — Ambulatory Visit: Payer: Medicaid Other | Admitting: Physical Therapy

## 2015-11-25 DIAGNOSIS — I69351 Hemiplegia and hemiparesis following cerebral infarction affecting right dominant side: Secondary | ICD-10-CM

## 2015-11-25 DIAGNOSIS — R278 Other lack of coordination: Secondary | ICD-10-CM | POA: Diagnosis not present

## 2015-11-25 DIAGNOSIS — R2689 Other abnormalities of gait and mobility: Secondary | ICD-10-CM
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
## 2015-11-27 ENCOUNTER — Encounter: Payer: Medicaid Other | Admitting: Occupational Therapy

## 2015-11-27 ENCOUNTER — Ambulatory Visit: Payer: Medicaid Other | Admitting: Physical Therapy

## 2015-11-27 ENCOUNTER — Ambulatory Visit: Payer: Medicaid Other | Admitting: Speech Pathology

## 2015-11-27 DIAGNOSIS — M6281 Muscle weakness (generalized): Secondary | ICD-10-CM

## 2015-11-27 DIAGNOSIS — R278 Other lack of coordination: Secondary | ICD-10-CM | POA: Diagnosis not present

## 2015-11-27 DIAGNOSIS — R4701 Aphasia: Secondary | ICD-10-CM

## 2015-11-27 DIAGNOSIS — R2689 Other abnormalities of gait and mobility: Secondary | ICD-10-CM
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
## 2015-11-28 MED FILL — AMLODIPINE BESYLATE 10 MG T: 10 | 30 days supply | Qty: 30 | Fill #3

## 2015-12-01 ENCOUNTER — Encounter: Payer: Medicaid Other | Admitting: Occupational Therapy

## 2015-12-01 ENCOUNTER — Ambulatory Visit: Payer: Medicaid Other | Admitting: Physical Therapy

## 2015-12-01 DIAGNOSIS — M6281 Muscle weakness (generalized): Secondary | ICD-10-CM

## 2015-12-01 DIAGNOSIS — R278 Other lack of coordination: Secondary | ICD-10-CM | POA: Diagnosis not present

## 2015-12-01 DIAGNOSIS — I69351 Hemiplegia and hemiparesis following cerebral infarction affecting right dominant side: Secondary | ICD-10-CM

## 2015-12-01 DIAGNOSIS — R2689 Other abnormalities of gait and mobility: Secondary | ICD-10-CM
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
## 2015-12-02 ENCOUNTER — Ambulatory Visit (INDEPENDENT_AMBULATORY_CARE_PROVIDER_SITE_OTHER): Payer: Medicaid Other | Admitting: Nurse Practitioner

## 2015-12-02 ENCOUNTER — Encounter: Payer: Self-pay | Admitting: Nurse Practitioner

## 2015-12-02 ENCOUNTER — Encounter: Payer: Self-pay | Admitting: Family Medicine

## 2015-12-02 ENCOUNTER — Ambulatory Visit: Payer: Medicaid Other | Attending: Family Medicine | Admitting: Family Medicine

## 2015-12-02 VITALS — BP 113/76 | HR 63 | Temp 98.0°F | Resp 14 | Ht 72.0 in

## 2015-12-02 VITALS — BP 136/82 | HR 76 | Ht 72.0 in

## 2015-12-02 DIAGNOSIS — I6329 Cerebral infarction due to unspecified occlusion or stenosis of other precerebral arteries: Secondary | ICD-10-CM

## 2015-12-02 DIAGNOSIS — F17219 Nicotine dependence, cigarettes, with unspecified nicotine-induced disorders: Secondary | ICD-10-CM | POA: Diagnosis not present

## 2015-12-02 DIAGNOSIS — G8191 Hemiplegia, unspecified affecting right dominant side: Secondary | ICD-10-CM

## 2015-12-02 DIAGNOSIS — R21 Rash and other nonspecific skin eruption: Secondary | ICD-10-CM

## 2015-12-02 DIAGNOSIS — I10 Essential (primary) hypertension: Secondary | ICD-10-CM

## 2015-12-02 DIAGNOSIS — F1010 Alcohol abuse, uncomplicated: Secondary | ICD-10-CM | POA: Diagnosis not present

## 2015-12-02 DIAGNOSIS — I63322 Cerebral infarction due to thrombosis of left anterior cerebral artery: Secondary | ICD-10-CM

## 2015-12-02 DIAGNOSIS — G8110 Spastic hemiplegia affecting unspecified side: Secondary | ICD-10-CM

## 2015-12-02 MED ORDER — AMLODIPINE BESYLATE 10 MG PO TABS: 10.0000 mg | ORAL_TABLET | Freq: Every day | ORAL | Status: DC

## 2015-12-02 MED ORDER — TIZANIDINE HCL 4 MG PO TABS: 4.0000 mg | ORAL_TABLET | Freq: Three times a day (TID) | ORAL | Status: DC

## 2015-12-02 MED ORDER — BACLOFEN 10 MG PO TABS: 10.0000 mg | ORAL_TABLET | Freq: Three times a day (TID) | ORAL | Status: DC

## 2015-12-02 MED ORDER — ASPIRIN 325 MG PO TBEC: 325.0000 mg | DELAYED_RELEASE_TABLET | Freq: Every day | ORAL | Status: DC

## 2015-12-02 MED ORDER — ATORVASTATIN CALCIUM 20 MG PO TABS: 20.0000 mg | ORAL_TABLET | Freq: Every day | ORAL | Status: DC

## 2015-12-02 MED ORDER — METOPROLOL TARTRATE 25 MG PO TABS: 25.0000 mg | ORAL_TABLET | Freq: Two times a day (BID) | ORAL | Status: DC
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
## 2015-12-02 NOTE — Progress Notes (Signed)
Pt here for F/U for HTN. Pt took medications today. Pt would like doctor to look at bump above right knee.

## 2015-12-02 NOTE — Patient Instructions (Signed)
Plan Management of stroke risk factors Continue aspirin for secondary stroke prevention Continue Lipitor for secondary stroke prevention, LDL to be monitored by PCP Keep Systolic blood pressure less than 130 today's reading  136/82 Continue  therapies  Stop smoking Stop alcohol  Follow-up with me in 6 months
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
## 2015-12-03 ENCOUNTER — Ambulatory Visit: Payer: Medicaid Other | Admitting: Physical Therapy

## 2015-12-08 ENCOUNTER — Ambulatory Visit: Payer: Medicaid Other | Admitting: Speech Pathology

## 2015-12-08 ENCOUNTER — Ambulatory Visit: Payer: Medicaid Other | Admitting: Physical Therapy

## 2015-12-08 ENCOUNTER — Encounter: Payer: Medicaid Other | Admitting: Occupational Therapy

## 2015-12-08 DIAGNOSIS — R4701 Aphasia: Secondary | ICD-10-CM

## 2015-12-08 DIAGNOSIS — R278 Other lack of coordination: Secondary | ICD-10-CM | POA: Diagnosis not present
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
## 2015-12-09 ENCOUNTER — Encounter: Payer: Medicaid Other | Attending: Physical Medicine & Rehabilitation

## 2015-12-09 ENCOUNTER — Encounter: Payer: Self-pay | Admitting: Physical Medicine & Rehabilitation

## 2015-12-09 ENCOUNTER — Ambulatory Visit (HOSPITAL_BASED_OUTPATIENT_CLINIC_OR_DEPARTMENT_OTHER): Payer: Medicaid Other | Admitting: Physical Medicine & Rehabilitation

## 2015-12-09 VITALS — BP 125/87 | HR 65 | Resp 14

## 2015-12-09 DIAGNOSIS — G8110 Spastic hemiplegia affecting unspecified side: Secondary | ICD-10-CM | POA: Insufficient documentation

## 2015-12-09 DIAGNOSIS — E785 Hyperlipidemia, unspecified: Secondary | ICD-10-CM | POA: Diagnosis not present

## 2015-12-09 DIAGNOSIS — Z8673 Personal history of transient ischemic attack (TIA), and cerebral infarction without residual deficits: Secondary | ICD-10-CM | POA: Diagnosis not present

## 2015-12-09 DIAGNOSIS — I10 Essential (primary) hypertension: Secondary | ICD-10-CM | POA: Diagnosis not present

## 2015-12-09 DIAGNOSIS — F17210 Nicotine dependence, cigarettes, uncomplicated: Secondary | ICD-10-CM | POA: Diagnosis not present

## 2015-12-09 NOTE — Progress Notes (Signed)
   Botox Injection for spasticity using needle EMG guidance  Dilution: 50 Units/ml Indication: Severe spasticity which interferes with ADL,mobility and/or  hygiene and is unresponsive to medication management and other conservative care Informed consent was obtained after describing risks and benefits of the procedure with the patient. This includes bleeding, bruising, infection, excessive weakness, or medication side effects. A REMS form is on file and signed. Needle: 2" 25 needle electrode Number of units per muscle Right biceps 150 units Right VMO 50 units Right vastus lateralis 50 units Right rectus femoris 50 units Right vastus intermedius 50 units Right hamstrings 50 units All injections were done after obtaining appropriate EMG activity and after negative drawback for blood. The patient tolerated the procedure well. Post procedure instructions were given. A followup appointment was made.

## 2015-12-09 NOTE — Patient Instructions (Signed)

## 2015-12-11 ENCOUNTER — Ambulatory Visit: Payer: Medicaid Other | Admitting: Physical Therapy

## 2015-12-12 ENCOUNTER — Ambulatory Visit: Payer: Medicaid Other | Admitting: Physical Medicine & Rehabilitation

## 2015-12-12 MED FILL — tiZANidine HCL 4 MG CAPS: 4 | 30 days supply | Qty: 90 | Fill #3

## 2015-12-12 MED FILL — ?ATORVASTATIN 20 MG TABLET: 20 | 30 days supply | Qty: 30 | Fill #3

## 2015-12-15 ENCOUNTER — Ambulatory Visit: Payer: Medicaid Other | Admitting: Physical Therapy

## 2015-12-15 ENCOUNTER — Ambulatory Visit: Payer: Medicaid Other | Attending: Physical Medicine & Rehabilitation | Admitting: Speech Pathology

## 2015-12-15 ENCOUNTER — Encounter: Payer: Medicaid Other | Admitting: Occupational Therapy

## 2015-12-15 DIAGNOSIS — M6281 Muscle weakness (generalized): Secondary | ICD-10-CM | POA: Diagnosis present

## 2015-12-15 DIAGNOSIS — G8111 Spastic hemiplegia affecting right dominant side: Secondary | ICD-10-CM | POA: Insufficient documentation

## 2015-12-15 DIAGNOSIS — R482 Apraxia: Secondary | ICD-10-CM | POA: Insufficient documentation

## 2015-12-15 DIAGNOSIS — R4701 Aphasia: Secondary | ICD-10-CM | POA: Diagnosis not present

## 2015-12-15 DIAGNOSIS — R2689 Other abnormalities of gait and mobility: Secondary | ICD-10-CM | POA: Insufficient documentation

## 2015-12-15 DIAGNOSIS — I69351 Hemiplegia and hemiparesis following cerebral infarction affecting right dominant side: Secondary | ICD-10-CM | POA: Diagnosis present

## 2015-12-15 DIAGNOSIS — R278 Other lack of coordination: Secondary | ICD-10-CM | POA: Diagnosis present

## 2015-12-15 DIAGNOSIS — M79601 Pain in right arm: Secondary | ICD-10-CM | POA: Diagnosis present
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
## 2015-12-18 ENCOUNTER — Ambulatory Visit: Payer: Medicaid Other | Admitting: Physical Therapy

## 2015-12-18 ENCOUNTER — Encounter: Payer: Medicaid Other | Admitting: Occupational Therapy

## 2015-12-23 ENCOUNTER — Encounter: Payer: Medicaid Other | Admitting: Occupational Therapy

## 2015-12-23 ENCOUNTER — Ambulatory Visit: Payer: Medicaid Other | Admitting: Physical Therapy

## 2015-12-25 ENCOUNTER — Encounter: Payer: Medicaid Other | Admitting: Occupational Therapy

## 2015-12-25 ENCOUNTER — Ambulatory Visit: Payer: Medicaid Other | Admitting: Physical Therapy

## 2015-12-29 ENCOUNTER — Other Ambulatory Visit: Payer: Self-pay | Admitting: Family Medicine

## 2015-12-29 ENCOUNTER — Ambulatory Visit: Payer: Medicaid Other | Admitting: Occupational Therapy

## 2015-12-29 ENCOUNTER — Encounter: Payer: Self-pay | Admitting: Occupational Therapy

## 2015-12-29 ENCOUNTER — Ambulatory Visit: Payer: Medicaid Other | Admitting: Physical Therapy

## 2015-12-29 DIAGNOSIS — I69351 Hemiplegia and hemiparesis following cerebral infarction affecting right dominant side: Secondary | ICD-10-CM

## 2015-12-29 DIAGNOSIS — G8111 Spastic hemiplegia affecting right dominant side: Secondary | ICD-10-CM

## 2015-12-29 DIAGNOSIS — M79601 Pain in right arm: Secondary | ICD-10-CM

## 2015-12-29 DIAGNOSIS — R4701 Aphasia: Secondary | ICD-10-CM | POA: Diagnosis not present

## 2015-12-29 DIAGNOSIS — R2689 Other abnormalities of gait and mobility: Secondary | ICD-10-CM

## 2015-12-29 MED FILL — AMLODIPINE BESYLATE 10 MG T: 10 | 30 days supply | Qty: 30 | Fill #0

## 2015-12-29 MED FILL — METOPROLOL TARTRATE 25 MG T: 25 | 30 days supply | Qty: 60 | Fill #3

## 2015-12-29 MED FILL — BACLOFEN 10 MG TABLET: 10 | 30 days supply | Qty: 90 | Fill #3
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
## 2015-12-30 MED FILL — LISINOPRIL 40 MG TABLET: 40 | 30 days supply | Qty: 30 | Fill #0
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
## 2016-01-02 MED FILL — ATORVASTATIN 20 MG TABLET: 20 | 30 days supply | Qty: 30 | Fill #0

## 2016-01-05 ENCOUNTER — Encounter: Payer: Self-pay | Admitting: Occupational Therapy

## 2016-01-05 ENCOUNTER — Ambulatory Visit: Payer: Medicaid Other | Admitting: Physical Therapy

## 2016-01-05 ENCOUNTER — Ambulatory Visit: Payer: Medicaid Other | Admitting: Occupational Therapy

## 2016-01-05 DIAGNOSIS — R4701 Aphasia: Secondary | ICD-10-CM | POA: Diagnosis not present

## 2016-01-05 DIAGNOSIS — R278 Other lack of coordination: Secondary | ICD-10-CM

## 2016-01-05 DIAGNOSIS — R2689 Other abnormalities of gait and mobility: Secondary | ICD-10-CM

## 2016-01-05 DIAGNOSIS — M79601 Pain in right arm: Secondary | ICD-10-CM

## 2016-01-05 DIAGNOSIS — R482 Apraxia: Secondary | ICD-10-CM

## 2016-01-05 DIAGNOSIS — G8111 Spastic hemiplegia affecting right dominant side: Secondary | ICD-10-CM
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
## 2016-01-06 ENCOUNTER — Encounter: Payer: Self-pay | Admitting: Physical Therapy
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
## 2016-01-12 ENCOUNTER — Ambulatory Visit: Payer: Medicaid Other | Admitting: Physical Therapy

## 2016-01-12 ENCOUNTER — Encounter: Payer: Self-pay | Admitting: Occupational Therapy

## 2016-01-12 ENCOUNTER — Ambulatory Visit: Payer: Medicaid Other | Admitting: Occupational Therapy

## 2016-01-12 DIAGNOSIS — G8111 Spastic hemiplegia affecting right dominant side: Secondary | ICD-10-CM

## 2016-01-12 DIAGNOSIS — R4701 Aphasia: Secondary | ICD-10-CM | POA: Diagnosis not present

## 2016-01-12 DIAGNOSIS — R2689 Other abnormalities of gait and mobility: Secondary | ICD-10-CM

## 2016-01-12 DIAGNOSIS — R482 Apraxia: Secondary | ICD-10-CM

## 2016-01-12 DIAGNOSIS — M79601 Pain in right arm: Secondary | ICD-10-CM

## 2016-01-12 DIAGNOSIS — M6281 Muscle weakness (generalized): Secondary | ICD-10-CM

## 2016-01-12 DIAGNOSIS — R278 Other lack of coordination: Secondary | ICD-10-CM
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
## 2016-01-12 NOTE — Patient Instructions (Signed)
Instructed in transfers, sit to stand and stand to sit as well standing balance and strategies to allow pt to be more independent in these activities as well as clothing mgmt with dressing and toileting

## 2016-01-13 ENCOUNTER — Encounter: Payer: Self-pay | Admitting: Physical Therapy
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
## 2016-01-15 ENCOUNTER — Encounter: Payer: Medicaid Other | Admitting: Occupational Therapy

## 2016-01-16 ENCOUNTER — Other Ambulatory Visit: Payer: Self-pay | Admitting: Family Medicine

## 2016-01-16 DIAGNOSIS — I6329 Cerebral infarction due to unspecified occlusion or stenosis of other precerebral arteries: Secondary | ICD-10-CM

## 2016-01-20 ENCOUNTER — Telehealth: Payer: Self-pay | Admitting: *Deleted

## 2016-01-20 ENCOUNTER — Encounter: Payer: Self-pay | Admitting: Occupational Therapy

## 2016-01-20 ENCOUNTER — Ambulatory Visit: Payer: Medicaid Other | Admitting: Physical Therapy

## 2016-01-20 ENCOUNTER — Ambulatory Visit: Payer: Medicaid Other | Attending: Physical Medicine & Rehabilitation | Admitting: Occupational Therapy

## 2016-01-20 DIAGNOSIS — G8111 Spastic hemiplegia affecting right dominant side: Secondary | ICD-10-CM | POA: Diagnosis not present

## 2016-01-20 DIAGNOSIS — R278 Other lack of coordination: Secondary | ICD-10-CM

## 2016-01-20 DIAGNOSIS — R2689 Other abnormalities of gait and mobility: Secondary | ICD-10-CM

## 2016-01-20 DIAGNOSIS — M79601 Pain in right arm: Secondary | ICD-10-CM | POA: Insufficient documentation

## 2016-01-20 DIAGNOSIS — R482 Apraxia: Secondary | ICD-10-CM | POA: Diagnosis present

## 2016-01-20 DIAGNOSIS — R269 Unspecified abnormalities of gait and mobility: Secondary | ICD-10-CM

## 2016-01-20 DIAGNOSIS — M6281 Muscle weakness (generalized): Secondary | ICD-10-CM

## 2016-01-20 DIAGNOSIS — G8110 Spastic hemiplegia affecting unspecified side: Secondary | ICD-10-CM
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
## 2016-01-20 NOTE — Telephone Encounter (Signed)
-----   Message from Charlett Blake, MD sent at 01/19/2016  5:16 PM EDT ----- Regarding: FW: request order for RW and shoe lift Please order hemiwalker and shoe lift ----- Message ----- From: Jen[place] Corner Dilday, PT Sent: 01/14/2016  11:08 AM To: Charlett Blake, MD Subject: request order for RW and shoe lift             Dr. Letta Pate, Mr. Wunschel is unable to use his hemiwalker safely for assist. with amb. - he is able to use a rolling walker and needs a 1/2" shoe lift on his LLE to assist with RLE swing through - would you please send an order through [employer] for RW with 5" wheels and for a shoe lift (1/2") for L shoe   I feel if we can obtain these items for him, he will be able to amb. with assistance in his home.  Thank you, Guido Sander, PT

## 2016-01-21 ENCOUNTER — Other Ambulatory Visit: Payer: Self-pay | Admitting: Family Medicine

## 2016-01-21 DIAGNOSIS — I6329 Cerebral infarction due to unspecified occlusion or stenosis of other precerebral arteries: Secondary | ICD-10-CM

## 2016-01-26 ENCOUNTER — Encounter: Payer: Self-pay | Admitting: Occupational Therapy

## 2016-01-26 ENCOUNTER — Ambulatory Visit: Payer: Medicaid Other | Admitting: Occupational Therapy

## 2016-01-26 ENCOUNTER — Ambulatory Visit: Payer: Medicaid Other | Admitting: Physical Therapy

## 2016-01-26 DIAGNOSIS — G8111 Spastic hemiplegia affecting right dominant side: Secondary | ICD-10-CM | POA: Diagnosis not present

## 2016-01-26 DIAGNOSIS — R482 Apraxia: Secondary | ICD-10-CM

## 2016-01-26 DIAGNOSIS — R278 Other lack of coordination: Secondary | ICD-10-CM

## 2016-01-26 DIAGNOSIS — M79601 Pain in right arm: Secondary | ICD-10-CM

## 2016-01-26 DIAGNOSIS — R2689 Other abnormalities of gait and mobility: Secondary | ICD-10-CM

## 2016-01-26 NOTE — Patient Instructions (Addendum)
Use of a grab bar in his bedroom to help with toileting, and pulling his pants up and down:  Place grab bar on the wall so his chair can face the wall and put his commode next to the chair. He can use the bar to help him stand and transfer on to and off of the commode. Once he is on the commode use grab bar to stand, and his right hand stays on the commode for balance. Cue him to use his left hand to pull pants up and down.  For bed mobility: 1. Lean on right elbow. 2. You need to help him keep his right knee and hip bent. 3. Lay down with knees and hips bent and roll onto back. 4. Place blanket roll under knees to sleep 5. In the morning bend knees and hips as far as you can and then remove the blanket roll 6. Bend knees and hips even more. 7. KEEPING HIS RIGHT LEG BENT, roll on to his side. 8. KEEPING HIS RIGHT LEG BENT, push up into sitting. Make sure he doesn't lean back when pushing into sitting.
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
## 2016-01-28 ENCOUNTER — Other Ambulatory Visit: Payer: Self-pay | Admitting: Family Medicine

## 2016-01-28 DIAGNOSIS — I6329 Cerebral infarction due to unspecified occlusion or stenosis of other precerebral arteries: Secondary | ICD-10-CM

## 2016-01-29 ENCOUNTER — Encounter: Payer: Self-pay | Admitting: Physical Medicine & Rehabilitation

## 2016-01-29 ENCOUNTER — Encounter: Payer: Medicaid Other | Attending: Physical Medicine & Rehabilitation

## 2016-01-29 ENCOUNTER — Ambulatory Visit (HOSPITAL_BASED_OUTPATIENT_CLINIC_OR_DEPARTMENT_OTHER): Payer: Medicaid Other | Admitting: Physical Medicine & Rehabilitation

## 2016-01-29 ENCOUNTER — Other Ambulatory Visit: Payer: Self-pay | Admitting: Family Medicine

## 2016-01-29 VITALS — BP 156/93 | HR 79 | Temp 98.0°F

## 2016-01-29 DIAGNOSIS — Z8673 Personal history of transient ischemic attack (TIA), and cerebral infarction without residual deficits: Secondary | ICD-10-CM | POA: Insufficient documentation

## 2016-01-29 DIAGNOSIS — E785 Hyperlipidemia, unspecified: Secondary | ICD-10-CM | POA: Diagnosis not present

## 2016-01-29 DIAGNOSIS — F17210 Nicotine dependence, cigarettes, uncomplicated: Secondary | ICD-10-CM | POA: Insufficient documentation

## 2016-01-29 DIAGNOSIS — I6329 Cerebral infarction due to unspecified occlusion or stenosis of other precerebral arteries: Secondary | ICD-10-CM

## 2016-01-29 DIAGNOSIS — I10 Essential (primary) hypertension: Secondary | ICD-10-CM | POA: Insufficient documentation

## 2016-01-29 DIAGNOSIS — G8110 Spastic hemiplegia affecting unspecified side: Secondary | ICD-10-CM | POA: Insufficient documentation

## 2016-01-29 MED FILL — METOPROLOL TARTRATE 25 MG T: 25 | 30 days supply | Qty: 60 | Fill #0

## 2016-01-29 MED FILL — BACLOFEN 10 MG TABLET: 10 | 30 days supply | Qty: 90 | Fill #0

## 2016-01-29 MED FILL — AMLODIPINE BESYLATE 10 MG T: 10 | 30 days supply | Qty: 30 | Fill #1

## 2016-01-29 MED FILL — ATORVASTATIN 20 MG TABLET: 20 | 30 days supply | Qty: 30 | Fill #1

## 2016-01-29 MED FILL — LISINOPRIL 40 MG TABLET: 40 | 30 days supply | Qty: 30 | Fill #1
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
## 2016-01-29 NOTE — Patient Instructions (Signed)
Repeat botox in 6 weeks  Continue exercise program and walking at home  Agree with water aerobics

## 2016-02-03 ENCOUNTER — Encounter: Payer: Self-pay | Admitting: Occupational Therapy

## 2016-02-03 ENCOUNTER — Ambulatory Visit: Payer: Medicaid Other | Admitting: Physical Therapy

## 2016-02-03 ENCOUNTER — Ambulatory Visit: Payer: Medicaid Other | Admitting: Occupational Therapy

## 2016-02-03 DIAGNOSIS — M79601 Pain in right arm: Secondary | ICD-10-CM

## 2016-02-03 DIAGNOSIS — R278 Other lack of coordination: Secondary | ICD-10-CM

## 2016-02-03 DIAGNOSIS — G8111 Spastic hemiplegia affecting right dominant side: Secondary | ICD-10-CM

## 2016-02-03 DIAGNOSIS — R482 Apraxia: Secondary | ICD-10-CM

## 2016-02-03 DIAGNOSIS — R2689 Other abnormalities of gait and mobility: Secondary | ICD-10-CM
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
## 2016-02-05 ENCOUNTER — Ambulatory Visit: Payer: Medicaid Other | Admitting: Physical Therapy

## 2016-02-05 DIAGNOSIS — G8111 Spastic hemiplegia affecting right dominant side: Secondary | ICD-10-CM | POA: Diagnosis not present

## 2016-02-05 DIAGNOSIS — R2689 Other abnormalities of gait and mobility: Secondary | ICD-10-CM
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
## 2016-02-09 ENCOUNTER — Ambulatory Visit: Payer: Medicaid Other | Admitting: Occupational Therapy

## 2016-02-09 ENCOUNTER — Encounter: Payer: Self-pay | Admitting: Occupational Therapy

## 2016-02-09 DIAGNOSIS — G8111 Spastic hemiplegia affecting right dominant side: Secondary | ICD-10-CM | POA: Diagnosis not present

## 2016-02-09 DIAGNOSIS — R278 Other lack of coordination: Secondary | ICD-10-CM

## 2016-02-09 DIAGNOSIS — R482 Apraxia: Secondary | ICD-10-CM

## 2016-02-09 DIAGNOSIS — M79601 Pain in right arm: Secondary | ICD-10-CM
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
## 2016-02-11 ENCOUNTER — Telehealth: Payer: Self-pay | Admitting: Physical Medicine & Rehabilitation

## 2016-02-11 NOTE — Telephone Encounter (Signed)
Jolana (patient's sister) said that the therapist had suggested that the patient get something for anxiety.  She would like a call back at [phone].

## 2016-02-12 NOTE — Telephone Encounter (Signed)
Please advise? Or would you like his PCP to take care of this?

## 2016-02-12 NOTE — Telephone Encounter (Signed)
Advised PCP to arrange this

## 2016-02-23 MED FILL — LISINOPRIL 40 MG TABLET: 40 | 30 days supply | Qty: 30 | Fill #2

## 2016-02-23 MED FILL — AMLODIPINE BESYLATE 10 MG T: 10 | 30 days supply | Qty: 30 | Fill #2

## 2016-02-23 MED FILL — METOPROLOL TARTRATE 25 MG T: 25 | 30 days supply | Qty: 60 | Fill #1

## 2016-02-24 MED FILL — ATORVASTATIN 20 MG TABLET: 20 | 30 days supply | Qty: 30 | Fill #2 | Status: TO

## 2016-03-02 MED FILL — BACLOFEN 10 MG TABLET: 10 | 30 days supply | Qty: 90 | Fill #1 | Status: TO

## 2016-03-15 ENCOUNTER — Encounter: Payer: Self-pay | Admitting: Physical Medicine & Rehabilitation

## 2016-03-15 ENCOUNTER — Ambulatory Visit (HOSPITAL_BASED_OUTPATIENT_CLINIC_OR_DEPARTMENT_OTHER): Payer: Medicaid Other | Admitting: Physical Medicine & Rehabilitation

## 2016-03-15 ENCOUNTER — Encounter: Payer: Medicaid Other | Attending: Physical Medicine & Rehabilitation

## 2016-03-15 DIAGNOSIS — I10 Essential (primary) hypertension: Secondary | ICD-10-CM | POA: Diagnosis not present

## 2016-03-15 DIAGNOSIS — I6329 Cerebral infarction due to unspecified occlusion or stenosis of other precerebral arteries: Secondary | ICD-10-CM

## 2016-03-15 DIAGNOSIS — Z8673 Personal history of transient ischemic attack (TIA), and cerebral infarction without residual deficits: Secondary | ICD-10-CM | POA: Insufficient documentation

## 2016-03-15 DIAGNOSIS — G8110 Spastic hemiplegia affecting unspecified side: Secondary | ICD-10-CM | POA: Insufficient documentation

## 2016-03-15 DIAGNOSIS — E785 Hyperlipidemia, unspecified: Secondary | ICD-10-CM | POA: Insufficient documentation

## 2016-03-15 DIAGNOSIS — F17210 Nicotine dependence, cigarettes, uncomplicated: Secondary | ICD-10-CM | POA: Diagnosis not present

## 2016-03-15 MED ORDER — SERTRALINE HCL 25 MG PO TABS: 25.0000 mg | ORAL_TABLET | Freq: Every day | ORAL | Status: DC

## 2016-03-15 MED ORDER — TIZANIDINE HCL 4 MG PO CAPS: 4.0000 mg | ORAL_CAPSULE | Freq: Three times a day (TID) | ORAL | 3 refills | Status: DC

## 2016-03-15 NOTE — Patient Instructions (Signed)

## 2016-03-15 NOTE — Progress Notes (Signed)
   Botox Injection for spasticity using needle EMG guidance  Dilution: 50 Units/ml Indication: Severe spasticity which interferes with ADL,mobility and/or  hygiene and is unresponsive to medication management and other conservative care Informed consent was obtained after describing risks and benefits of the procedure with the patient. This includes bleeding, bruising, infection, excessive weakness, or medication side effects. A REMS form is on file and signed. Needle: 2" 25 needle electrode Number of units per muscle Right biceps 150 units Right VMO 50 units Right vastus lateralis 50 units Right rectus femoris 50 units Right vastus intermedius 50 units Right hamstrings 50 units All injections were done after obtaining appropriate EMG activity and after negative drawback for blood. The patient tolerated the procedure well. Post procedure instructions were given. A followup appointment was made.    Patient's sister indicates a lot of anxiety during transfers. We discussed recommendation against benzodiazepines. Would be willing to start a low dose of sertraline. Follow-up in 6 weeks

## 2016-03-23 MED FILL — METOPROLOL TARTRATE 25 MG T: 25 | 30 days supply | Qty: 60 | Fill #2 | Status: TO

## 2016-03-29 MED FILL — AMLODIPINE BESYLATE 10 MG T: 10 | 30 days supply | Qty: 30 | Fill #3

## 2016-03-29 MED FILL — LISINOPRIL 40 MG TABLET: 40 | 30 days supply | Qty: 30 | Fill #3

## 2016-04-20 ENCOUNTER — Encounter: Payer: Self-pay | Admitting: Family Medicine

## 2016-04-20 ENCOUNTER — Ambulatory Visit: Payer: Medicaid Other | Attending: Family Medicine | Admitting: Family Medicine

## 2016-04-20 VITALS — BP 143/92 | HR 75 | Temp 98.1°F | Ht 72.0 in | Wt 232.8 lb

## 2016-04-20 DIAGNOSIS — I6329 Cerebral infarction due to unspecified occlusion or stenosis of other precerebral arteries: Secondary | ICD-10-CM | POA: Diagnosis not present

## 2016-04-20 DIAGNOSIS — I10 Essential (primary) hypertension: Secondary | ICD-10-CM | POA: Insufficient documentation

## 2016-04-20 DIAGNOSIS — Z8673 Personal history of transient ischemic attack (TIA), and cerebral infarction without residual deficits: Secondary | ICD-10-CM | POA: Insufficient documentation

## 2016-04-20 DIAGNOSIS — F411 Generalized anxiety disorder: Secondary | ICD-10-CM | POA: Insufficient documentation

## 2016-04-20 DIAGNOSIS — L03115 Cellulitis of right lower limb: Secondary | ICD-10-CM | POA: Insufficient documentation

## 2016-04-20 DIAGNOSIS — Z79899 Other long term (current) drug therapy: Secondary | ICD-10-CM | POA: Diagnosis not present

## 2016-04-20 DIAGNOSIS — Z7982 Long term (current) use of aspirin: Secondary | ICD-10-CM | POA: Diagnosis not present

## 2016-04-20 LAB — COMPLETE METABOLIC PANEL WITH GFR
ALT: 27 U/L (ref 9–46)
AST: 21 U/L (ref 10–35)
Albumin: 4 g/dL (ref 3.6–5.1)
Alkaline Phosphatase: 71 U/L (ref 40–115)
BUN: 7 mg/dL (ref 7–25)
CO2: 28 mmol/L (ref 20–31)
Calcium: 9.7 mg/dL (ref 8.6–10.3)
Chloride: 103 mmol/L (ref 98–110)
Creat: 0.94 mg/dL (ref 0.70–1.33)
GFR, Est African American: >89 mL/min (ref >=60)
GFR, Est Non African American: >89 mL/min (ref >=60)
Glucose, Bld: 97 mg/dL (ref 65–99)
Potassium: 4 mmol/L (ref 3.5–5.3)
Sodium: 139 mmol/L (ref 135–146)
Total Bilirubin: 0.9 mg/dL (ref 0.2–1.2)
Total Protein: 7.3 g/dL (ref 6.1–8.1)

## 2016-04-20 MED ORDER — ATORVASTATIN CALCIUM 20 MG PO TABS: 20.0000 mg | ORAL_TABLET | Freq: Every day | ORAL | 3 refills | Status: DC

## 2016-04-20 MED ORDER — METOPROLOL TARTRATE 25 MG PO TABS: 25.0000 mg | ORAL_TABLET | Freq: Two times a day (BID) | ORAL | 3 refills | Status: DC

## 2016-04-20 MED ORDER — LISINOPRIL 40 MG PO TABS: 40.0000 mg | ORAL_TABLET | Freq: Every day | ORAL | 3 refills | Status: DC

## 2016-04-20 MED ORDER — CEPHALEXIN 500 MG PO CAPS: 500.0000 mg | ORAL_CAPSULE | Freq: Two times a day (BID) | ORAL | 0 refills | Status: DC

## 2016-04-20 MED ORDER — SERTRALINE HCL 50 MG PO TABS: 50.0000 mg | ORAL_TABLET | Freq: Every day | ORAL | 3 refills | Status: DC

## 2016-04-20 MED ORDER — ASPIRIN 325 MG PO TBEC: 325.0000 mg | DELAYED_RELEASE_TABLET | Freq: Every day | ORAL | 3 refills | Status: DC

## 2016-04-20 MED ORDER — BACLOFEN 10 MG PO TABS: 10.0000 mg | ORAL_TABLET | Freq: Three times a day (TID) | ORAL | 3 refills | Status: DC

## 2016-04-20 MED ORDER — AMLODIPINE BESYLATE 10 MG PO TABS: 10.0000 mg | ORAL_TABLET | Freq: Every day | ORAL | 3 refills | Status: DC

## 2016-04-20 NOTE — Progress Notes (Signed)
Needs zoloft and cant fill the zanaflex on medicaid

## 2016-04-20 NOTE — Patient Instructions (Signed)
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
## 2016-04-22 ENCOUNTER — Telehealth: Payer: Self-pay

## 2016-04-22 NOTE — Telephone Encounter (Signed)
Writer called patient to discuss his lab results. Patient's sister answered the phone , patient was not available.  Patient's sister was going to let him know that everything came back normal.

## 2016-04-22 NOTE — Telephone Encounter (Signed)
-----   Message from Arnoldo Morale, MD sent at 04/21/2016  2:00 PM EST ----- Please inform the patient that labs are normal. Thank you.

## 2016-04-26 ENCOUNTER — Encounter: Payer: Medicaid Other | Attending: Physical Medicine & Rehabilitation

## 2016-04-26 ENCOUNTER — Ambulatory Visit (HOSPITAL_BASED_OUTPATIENT_CLINIC_OR_DEPARTMENT_OTHER): Payer: Medicaid Other | Admitting: Physical Medicine & Rehabilitation

## 2016-04-26 ENCOUNTER — Encounter: Payer: Self-pay | Admitting: Physical Medicine & Rehabilitation

## 2016-04-26 VITALS — BP 121/84 | HR 73 | Resp 14

## 2016-04-26 DIAGNOSIS — G8110 Spastic hemiplegia affecting unspecified side: Secondary | ICD-10-CM | POA: Insufficient documentation

## 2016-04-26 DIAGNOSIS — F17210 Nicotine dependence, cigarettes, uncomplicated: Secondary | ICD-10-CM | POA: Insufficient documentation

## 2016-04-26 DIAGNOSIS — E785 Hyperlipidemia, unspecified: Secondary | ICD-10-CM | POA: Insufficient documentation

## 2016-04-26 DIAGNOSIS — I10 Essential (primary) hypertension: Secondary | ICD-10-CM | POA: Diagnosis not present

## 2016-04-26 DIAGNOSIS — Z8673 Personal history of transient ischemic attack (TIA), and cerebral infarction without residual deficits: Secondary | ICD-10-CM | POA: Diagnosis not present

## 2016-04-26 NOTE — Patient Instructions (Signed)
The infection looks like it has improved. However, the swelling is persistent. Would recommend elevating the leg whenever the patient is sitting as well as Ace wrapping from the foot up to the calf level below the knee  The Botox has been helpful. Would recommend reinjection in 6-8 weeks
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
## 2016-05-04 ENCOUNTER — Ambulatory Visit: Payer: Medicaid Other | Admitting: Family Medicine

## 2016-05-19 ENCOUNTER — Other Ambulatory Visit: Payer: Self-pay | Admitting: *Deleted

## 2016-05-19 DIAGNOSIS — I6329 Cerebral infarction due to unspecified occlusion or stenosis of other precerebral arteries: Secondary | ICD-10-CM

## 2016-05-22 ENCOUNTER — Emergency Department (HOSPITAL_COMMUNITY): Payer: Medicaid Other

## 2016-05-22 ENCOUNTER — Emergency Department (HOSPITAL_COMMUNITY)
Admission: EM | Admit: 2016-05-22 | Discharge: 2016-05-22 | Disposition: A | Discharge status: Home / Self Care | Payer: Medicaid Other | Attending: Emergency Medicine | Admitting: Emergency Medicine

## 2016-05-22 ENCOUNTER — Encounter (HOSPITAL_COMMUNITY): Payer: Self-pay | Admitting: *Deleted

## 2016-05-22 DIAGNOSIS — I10 Essential (primary) hypertension: Secondary | ICD-10-CM | POA: Diagnosis not present

## 2016-05-22 DIAGNOSIS — F17210 Nicotine dependence, cigarettes, uncomplicated: Secondary | ICD-10-CM | POA: Diagnosis not present

## 2016-05-22 DIAGNOSIS — Z8673 Personal history of transient ischemic attack (TIA), and cerebral infarction without residual deficits: Secondary | ICD-10-CM | POA: Diagnosis not present

## 2016-05-22 DIAGNOSIS — R569 Unspecified convulsions: Secondary | ICD-10-CM | POA: Insufficient documentation

## 2016-05-22 DIAGNOSIS — Z7982 Long term (current) use of aspirin: Secondary | ICD-10-CM | POA: Insufficient documentation

## 2016-05-22 DIAGNOSIS — N390 Urinary tract infection, site not specified: Secondary | ICD-10-CM | POA: Diagnosis not present

## 2016-05-22 DIAGNOSIS — R4182 Altered mental status, unspecified: Secondary | ICD-10-CM | POA: Diagnosis present

## 2016-05-22 DIAGNOSIS — G40909 Epilepsy, unspecified, not intractable, without status epilepticus: Secondary | ICD-10-CM

## 2016-05-22 DIAGNOSIS — Z79899 Other long term (current) drug therapy: Secondary | ICD-10-CM | POA: Insufficient documentation

## 2016-05-22 LAB — URINALYSIS, ROUTINE W REFLEX MICROSCOPIC
Bilirubin Urine: NEGATIVE
Glucose, UA: NEGATIVE mg/dL
Ketones, ur: NEGATIVE mg/dL
Nitrite: NEGATIVE
Protein, ur: NEGATIVE mg/dL
Specific Gravity, Urine: 1.01 (ref 1.005–1.030)
pH: 7 (ref 5.0–8.0)

## 2016-05-22 LAB — DIFFERENTIAL
Basophils Absolute: 0 10*3/uL (ref 0.0–0.1)
Basophils Relative: 0 %
Eosinophils Absolute: 0.1 10*3/uL (ref 0.0–0.7)
Eosinophils Relative: 1 %
Lymphocytes Relative: 9 %
Lymphs Abs: 1.6 10*3/uL (ref 0.7–4.0)
Monocytes Absolute: 1.4 10*3/uL — ABNORMAL HIGH (ref 0.1–1.0)
Monocytes Relative: 8 %
Neutro Abs: 14.5 10*3/uL — ABNORMAL HIGH (ref 1.7–7.7)
Neutrophils Relative %: 82 %

## 2016-05-22 LAB — RAPID URINE DRUG SCREEN, HOSP PERFORMED
Amphetamines: NOT DETECTED
Barbiturates: NOT DETECTED
Benzodiazepines: NOT DETECTED
Cocaine: NOT DETECTED
Opiates: NOT DETECTED
Tetrahydrocannabinol: NOT DETECTED

## 2016-05-22 LAB — URINALYSIS, MICROSCOPIC (REFLEX)

## 2016-05-22 LAB — I-STAT CHEM 8, ED
BUN: 7 mg/dL (ref 6–20)
Calcium, Ion: 1.11 mmol/L — ABNORMAL LOW (ref 1.15–1.40)
Chloride: 102 mmol/L (ref 101–111)
Creatinine, Ser: 0.9 mg/dL (ref 0.61–1.24)
Glucose, Bld: 118 mg/dL — ABNORMAL HIGH (ref 65–99)
HCT: 45 % (ref 39.0–52.0)
Hemoglobin: 15.3 g/dL (ref 13.0–17.0)
Potassium: 4.1 mmol/L (ref 3.5–5.1)
Sodium: 137 mmol/L (ref 135–145)
TCO2: 22 mmol/L (ref 0–100)

## 2016-05-22 LAB — COMPREHENSIVE METABOLIC PANEL
ALT: 25 U/L (ref 17–63)
AST: 26 U/L (ref 15–41)
Albumin: 3.5 g/dL (ref 3.5–5.0)
Alkaline Phosphatase: 72 U/L (ref 38–126)
Anion gap: 10 (ref 5–15)
BUN: 7 mg/dL (ref 6–20)
CO2: 21 mmol/L — ABNORMAL LOW (ref 22–32)
Calcium: 9.2 mg/dL (ref 8.9–10.3)
Chloride: 103 mmol/L (ref 101–111)
Creatinine, Ser: 0.95 mg/dL (ref 0.61–1.24)
GFR calc Af Amer: >60 mL/min (ref >60)
GFR calc non Af Amer: >60 mL/min (ref >60)
Glucose, Bld: 115 mg/dL — ABNORMAL HIGH (ref 65–99)
Potassium: 4.1 mmol/L (ref 3.5–5.1)
Sodium: 134 mmol/L — ABNORMAL LOW (ref 135–145)
Total Bilirubin: 1.3 mg/dL — ABNORMAL HIGH (ref 0.3–1.2)
Total Protein: 7 g/dL (ref 6.5–8.1)

## 2016-05-22 LAB — CBC
HCT: 42.7 % (ref 39.0–52.0)
Hemoglobin: 14.1 g/dL (ref 13.0–17.0)
MCH: 30.7 pg (ref 26.0–34.0)
MCHC: 33 g/dL (ref 30.0–36.0)
MCV: 92.8 fL (ref 78.0–100.0)
Platelets: 246 10*3/uL (ref 150–400)
RBC: 4.6 MIL/uL (ref 4.22–5.81)
RDW: 13.6 % (ref 11.5–15.5)
WBC: 17.7 10*3/uL — ABNORMAL HIGH (ref 4.0–10.5)

## 2016-05-22 LAB — PROTIME-INR
INR: 1.2
Prothrombin Time: 15.2 seconds (ref 11.4–15.2)

## 2016-05-22 LAB — I-STAT TROPONIN, ED: Troponin i, poc: 0 ng/mL (ref 0.00–0.08)

## 2016-05-22 LAB — ETHANOL: Alcohol, Ethyl (B): <5 mg/dL (ref <5)

## 2016-05-22 LAB — APTT: aPTT: 31 seconds (ref 24–36)

## 2016-05-22 MED ORDER — FOSFOMYCIN TROMETHAMINE 3 G PO PACK: 3.0000 g | PACK | ORAL | 0 refills | Status: AC

## 2016-05-22 MED ORDER — LEVETIRACETAM 500 MG PO TABS: 500.0000 mg | ORAL_TABLET | Freq: Two times a day (BID) | ORAL | Status: DC

## 2016-05-22 MED ORDER — LEVETIRACETAM 500 MG PO TABS: 500.0000 mg | ORAL_TABLET | Freq: Two times a day (BID) | ORAL | 2 refills | Status: DC

## 2016-05-22 MED ORDER — SODIUM CHLORIDE 0.9 % IV SOLN
1000.0000 mg | INTRAVENOUS | Status: AC
  Administered 2016-05-22: 1000 mg via INTRAVENOUS
  Filled 2016-05-22: qty 10

## 2016-05-22 MED ORDER — FOSFOMYCIN TROMETHAMINE 3 G PO PACK
3.0000 g | PACK | Freq: Once | ORAL | Status: AC
  Administered 2016-05-22: 3 g via ORAL
  Filled 2016-05-22: qty 3
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
## 2016-05-22 NOTE — ED Notes (Signed)
Pt verbalized understanding of d/c instructions and has no further questions. Pt stable and NAD.  

## 2016-05-22 NOTE — ED Notes (Signed)
Pt incontinent of urine during MRI.  Advised pt of continued need for urine sample.  Pt unable to provide at this time.
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
## 2016-05-22 NOTE — ED Triage Notes (Signed)
To ED via [facility] from home for eval of not acting himself. Pt with hx of right side stroke in Feb of this year with noted residual. Pt alert and oriented to person, per family at bedside this is normal. Pt denies pain. States he feels 'so-so'.

## 2016-05-22 NOTE — ED Notes (Signed)
MD in room with pt now.

## 2016-05-24 LAB — URINE CULTURE: Culture: >=100000 — AB

## 2016-05-25 ENCOUNTER — Telehealth (HOSPITAL_BASED_OUTPATIENT_CLINIC_OR_DEPARTMENT_OTHER): Payer: Self-pay | Admitting: Emergency Medicine

## 2016-05-25 NOTE — Telephone Encounter (Signed)
Post ED Visit - Positive Culture Follow-up  Culture report reviewed by antimicrobial stewardship pharmacist:  []  Elenor Quinones, Pharm.D. []  Heide Guile, Pharm.D., BCPS []  Parks Neptune, Pharm.D. [x]  Alycia Rossetti, Pharm.D., BCPS []  [place], [place].D., BCPS, AAHIVP []  Legrand Como, Pharm.D., BCPS, AAHIVP []  Milus Glazier, Pharm.D. []  Stephens November, [place].D.  Positive urine culture Treated with fosfomycin, organism sensitive to the same and no further patient follow-up is required at this time.  Hazle Nordmann 05/25/2016, 12:42 PM

## 2016-05-31 ENCOUNTER — Encounter: Payer: Self-pay | Admitting: Nurse Practitioner

## 2016-05-31 ENCOUNTER — Ambulatory Visit (INDEPENDENT_AMBULATORY_CARE_PROVIDER_SITE_OTHER): Payer: Medicaid Other | Admitting: Nurse Practitioner

## 2016-05-31 VITALS — BP 144/86 | HR 75 | Ht 72.0 in | Wt 211.4 lb

## 2016-05-31 DIAGNOSIS — R569 Unspecified convulsions: Secondary | ICD-10-CM

## 2016-05-31 DIAGNOSIS — F17219 Nicotine dependence, cigarettes, with unspecified nicotine-induced disorders: Secondary | ICD-10-CM | POA: Diagnosis not present

## 2016-05-31 DIAGNOSIS — G8191 Hemiplegia, unspecified affecting right dominant side: Secondary | ICD-10-CM | POA: Diagnosis not present

## 2016-05-31 DIAGNOSIS — I10 Essential (primary) hypertension: Secondary | ICD-10-CM | POA: Diagnosis not present

## 2016-05-31 DIAGNOSIS — F1010 Alcohol abuse, uncomplicated: Secondary | ICD-10-CM

## 2016-05-31 DIAGNOSIS — I69322 Dysarthria following cerebral infarction: Secondary | ICD-10-CM | POA: Diagnosis not present

## 2016-05-31 DIAGNOSIS — I63322 Cerebral infarction due to thrombosis of left anterior cerebral artery: Secondary | ICD-10-CM | POA: Diagnosis not present

## 2016-05-31 MED ORDER — LEVETIRACETAM 500 MG PO TABS: 500.0000 mg | ORAL_TABLET | Freq: Two times a day (BID) | ORAL | 6 refills | Status: DC

## 2016-05-31 NOTE — Progress Notes (Signed)
Fax confirmation received levetiracetam [phone].

## 2016-05-31 NOTE — Patient Instructions (Addendum)
Continue aspirin for secondary stroke prevention Continue Lipitor for secondary stroke prevention, LDL to be monitored by PCP Keep Systolic blood pressure less than 130 today's reading  136/82 Stop smoking Stop alcohol  Continue Keppra 500 twice daily for seizure disorder Follow-up with me in 6 months
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
## 2016-06-02 ENCOUNTER — Other Ambulatory Visit: Payer: Self-pay | Admitting: *Deleted

## 2016-06-02 DIAGNOSIS — I6329 Cerebral infarction due to unspecified occlusion or stenosis of other precerebral arteries: Secondary | ICD-10-CM

## 2016-06-02 MED ORDER — ATORVASTATIN CALCIUM 20 MG PO TABS: 20.0000 mg | ORAL_TABLET | Freq: Every day | ORAL | 3 refills | Status: DC

## 2016-06-16 NOTE — Progress Notes (Signed)
I reviewed note and agree with plan.   Lareen Mullings R. Ivalee Strauser, MD  Certified in Neurology, Neurophysiology and Neuroimaging  [facility] [address], [address] [place], [place] [address] [phone]   

## 2016-06-22 ENCOUNTER — Ambulatory Visit: Payer: Medicaid Other | Admitting: Physical Medicine & Rehabilitation

## 2016-07-16 ENCOUNTER — Encounter: Payer: BLUE CROSS/BLUE SHIELD | Attending: Physical Medicine & Rehabilitation

## 2016-07-16 ENCOUNTER — Encounter: Payer: Self-pay | Admitting: Physical Medicine & Rehabilitation

## 2016-07-16 ENCOUNTER — Ambulatory Visit (HOSPITAL_BASED_OUTPATIENT_CLINIC_OR_DEPARTMENT_OTHER): Payer: BLUE CROSS/BLUE SHIELD | Admitting: Physical Medicine & Rehabilitation

## 2016-07-16 VITALS — BP 162/106 | HR 61 | Resp 14

## 2016-07-16 DIAGNOSIS — G8110 Spastic hemiplegia affecting unspecified side: Secondary | ICD-10-CM | POA: Diagnosis present

## 2016-07-16 DIAGNOSIS — I10 Essential (primary) hypertension: Secondary | ICD-10-CM | POA: Insufficient documentation

## 2016-07-16 DIAGNOSIS — F17210 Nicotine dependence, cigarettes, uncomplicated: Secondary | ICD-10-CM | POA: Diagnosis not present

## 2016-07-16 DIAGNOSIS — E785 Hyperlipidemia, unspecified: Secondary | ICD-10-CM | POA: Diagnosis not present

## 2016-07-16 DIAGNOSIS — Z8673 Personal history of transient ischemic attack (TIA), and cerebral infarction without residual deficits: Secondary | ICD-10-CM | POA: Diagnosis not present

## 2016-07-16 NOTE — Progress Notes (Signed)
Botox Injection for spasticity using needle EMG guidance  Dilution: 50 Units/ml Indication: Severe spasticity which interferes with ADL,mobility and/or  hygiene and is unresponsive to medication management and other conservative care Informed consent was obtained after describing risks and benefits of the procedure with the patient. This includes bleeding, bruising, infection, excessive weakness, or medication side effects. A REMS form is on file and signed. Needle: 36mm 25 g needle electrode Number of units per muscle  Biceps75 Brachialis 75 VMO 50 Vast Int 50 Vast Lat 50 Rect Fem 50  Hamstrings50 All injections were done after obtaining appropriate EMG activity and after negative drawback for blood. The patient tolerated the procedure well. Post procedure instructions were given. A followup appointment was made.

## 2016-07-16 NOTE — Patient Instructions (Signed)

## 2016-08-31 ENCOUNTER — Ambulatory Visit: Payer: BLUE CROSS/BLUE SHIELD | Admitting: Physical Medicine & Rehabilitation

## 2016-09-01 ENCOUNTER — Other Ambulatory Visit: Payer: Self-pay | Admitting: Pharmacist

## 2016-09-01 DIAGNOSIS — I10 Essential (primary) hypertension: Secondary | ICD-10-CM

## 2016-09-01 MED ORDER — SERTRALINE HCL 50 MG PO TABS: 50.0000 mg | ORAL_TABLET | Freq: Every day | ORAL | 0 refills | Status: DC

## 2016-09-01 MED ORDER — METOPROLOL TARTRATE 25 MG PO TABS: 25.0000 mg | ORAL_TABLET | Freq: Two times a day (BID) | ORAL | 0 refills | Status: DC

## 2016-09-06 ENCOUNTER — Other Ambulatory Visit: Payer: Self-pay | Admitting: Family Medicine

## 2016-09-06 DIAGNOSIS — I6329 Cerebral infarction due to unspecified occlusion or stenosis of other precerebral arteries: Secondary | ICD-10-CM

## 2016-09-14 ENCOUNTER — Ambulatory Visit (HOSPITAL_BASED_OUTPATIENT_CLINIC_OR_DEPARTMENT_OTHER): Payer: BLUE CROSS/BLUE SHIELD | Admitting: Physical Medicine & Rehabilitation

## 2016-09-14 ENCOUNTER — Encounter: Payer: BLUE CROSS/BLUE SHIELD | Attending: Physical Medicine & Rehabilitation

## 2016-09-14 ENCOUNTER — Encounter: Payer: Self-pay | Admitting: Physical Medicine & Rehabilitation

## 2016-09-14 VITALS — BP 130/88 | HR 61

## 2016-09-14 DIAGNOSIS — Z8673 Personal history of transient ischemic attack (TIA), and cerebral infarction without residual deficits: Secondary | ICD-10-CM | POA: Diagnosis not present

## 2016-09-14 DIAGNOSIS — F17210 Nicotine dependence, cigarettes, uncomplicated: Secondary | ICD-10-CM | POA: Insufficient documentation

## 2016-09-14 DIAGNOSIS — G8110 Spastic hemiplegia affecting unspecified side: Secondary | ICD-10-CM | POA: Diagnosis not present

## 2016-09-14 DIAGNOSIS — I10 Essential (primary) hypertension: Secondary | ICD-10-CM | POA: Diagnosis not present

## 2016-09-14 DIAGNOSIS — E785 Hyperlipidemia, unspecified: Secondary | ICD-10-CM | POA: Insufficient documentation
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
## 2016-10-19 ENCOUNTER — Ambulatory Visit: Payer: BLUE CROSS/BLUE SHIELD | Admitting: Physical Medicine & Rehabilitation

## 2016-10-25 ENCOUNTER — Encounter: Payer: BLUE CROSS/BLUE SHIELD | Attending: Physical Medicine & Rehabilitation

## 2016-10-25 ENCOUNTER — Ambulatory Visit: Payer: BLUE CROSS/BLUE SHIELD | Admitting: Physical Medicine & Rehabilitation

## 2016-10-25 DIAGNOSIS — F17210 Nicotine dependence, cigarettes, uncomplicated: Secondary | ICD-10-CM | POA: Insufficient documentation

## 2016-10-25 DIAGNOSIS — I10 Essential (primary) hypertension: Secondary | ICD-10-CM | POA: Insufficient documentation

## 2016-10-25 DIAGNOSIS — G8110 Spastic hemiplegia affecting unspecified side: Secondary | ICD-10-CM | POA: Insufficient documentation

## 2016-10-25 DIAGNOSIS — E785 Hyperlipidemia, unspecified: Secondary | ICD-10-CM | POA: Insufficient documentation

## 2016-10-25 DIAGNOSIS — Z8673 Personal history of transient ischemic attack (TIA), and cerebral infarction without residual deficits: Secondary | ICD-10-CM | POA: Insufficient documentation

## 2016-11-16 ENCOUNTER — Telehealth: Payer: Self-pay | Admitting: Nurse Practitioner

## 2016-11-16 NOTE — Telephone Encounter (Signed)
Spoke to sister.  Pt has had 2 sz in may 2018, 2 wks apart.  (one nocturnal lasting 2 min - witnessed) and the second by sister after taking his pills inn the morning , lasted about 2 min).  Moved up appt to 11-18-16 at 1045.  She verbalized understanding.  He has been taking his medication keppra 500mg  po bid.

## 2016-11-16 NOTE — Telephone Encounter (Signed)
Patients sister Kathryne Gin (listed on DPR) called office in reference to patient having 2 seizures in May and having current headaches going on and off.  Patients sister didn't know if he needed to come in to be seen sooner or if she needs to reach out to his PCP.  Please call

## 2016-11-18 ENCOUNTER — Ambulatory Visit (INDEPENDENT_AMBULATORY_CARE_PROVIDER_SITE_OTHER): Payer: BLUE CROSS/BLUE SHIELD | Admitting: Nurse Practitioner

## 2016-11-18 ENCOUNTER — Encounter: Payer: Self-pay | Admitting: Nurse Practitioner

## 2016-11-18 VITALS — BP 133/89 | HR 65 | Ht 72.0 in

## 2016-11-18 DIAGNOSIS — F1010 Alcohol abuse, uncomplicated: Secondary | ICD-10-CM

## 2016-11-18 DIAGNOSIS — I10 Essential (primary) hypertension: Secondary | ICD-10-CM | POA: Diagnosis not present

## 2016-11-18 DIAGNOSIS — G8110 Spastic hemiplegia affecting unspecified side: Secondary | ICD-10-CM

## 2016-11-18 DIAGNOSIS — F17219 Nicotine dependence, cigarettes, with unspecified nicotine-induced disorders: Secondary | ICD-10-CM

## 2016-11-18 DIAGNOSIS — I63322 Cerebral infarction due to thrombosis of left anterior cerebral artery: Secondary | ICD-10-CM | POA: Diagnosis not present

## 2016-11-18 MED ORDER — LEVETIRACETAM 500 MG PO TABS
ORAL_TABLET | ORAL | 6 refills | Status: DC
Start: 2016-11-18 — End: 2017-06-09
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
## 2016-11-18 NOTE — Patient Instructions (Signed)
Management of stroke risk factors Continue aspirin for secondary stroke prevention Continue Lipitor for secondary stroke prevention, LDL to be monitored by PCP Keep Systolic blood pressure less than 130 today's reading  133/89 continue Norvasc and lisinopril and Lopressor  Stop smoking Stop alcohol  Increase  Keppra 500 am and 1000mg  at night for seizure disorder will refill Order PT and OT Follow-up with me in 6 months

## 2016-11-18 NOTE — Progress Notes (Signed)
Keppra rx printed, signed and faxed to [place].

## 2016-11-18 NOTE — Progress Notes (Signed)
I agree with the above plan 

## 2016-11-22 ENCOUNTER — Ambulatory Visit: Payer: BLUE CROSS/BLUE SHIELD | Attending: Nurse Practitioner | Admitting: Occupational Therapy

## 2016-11-22 DIAGNOSIS — R41844 Frontal lobe and executive function deficit: Secondary | ICD-10-CM | POA: Insufficient documentation

## 2016-11-22 DIAGNOSIS — R2689 Other abnormalities of gait and mobility: Secondary | ICD-10-CM | POA: Diagnosis present

## 2016-11-22 DIAGNOSIS — M6281 Muscle weakness (generalized): Secondary | ICD-10-CM | POA: Insufficient documentation

## 2016-11-22 DIAGNOSIS — I69351 Hemiplegia and hemiparesis following cerebral infarction affecting right dominant side: Secondary | ICD-10-CM | POA: Insufficient documentation

## 2016-11-22 DIAGNOSIS — I69318 Other symptoms and signs involving cognitive functions following cerebral infarction: Secondary | ICD-10-CM | POA: Insufficient documentation

## 2016-11-22 DIAGNOSIS — R2681 Unsteadiness on feet: Secondary | ICD-10-CM | POA: Insufficient documentation

## 2016-11-22 DIAGNOSIS — R278 Other lack of coordination: Secondary | ICD-10-CM | POA: Insufficient documentation

## 2016-11-22 DIAGNOSIS — R482 Apraxia: Secondary | ICD-10-CM | POA: Diagnosis present

## 2016-11-22 DIAGNOSIS — R29818 Other symptoms and signs involving the nervous system: Secondary | ICD-10-CM | POA: Insufficient documentation
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
## 2016-11-29 ENCOUNTER — Ambulatory Visit: Payer: Medicaid Other | Admitting: Nurse Practitioner

## 2016-11-29 ENCOUNTER — Ambulatory Visit: Payer: BLUE CROSS/BLUE SHIELD | Admitting: Physical Therapy

## 2016-11-29 DIAGNOSIS — R2689 Other abnormalities of gait and mobility: Secondary | ICD-10-CM

## 2016-11-29 DIAGNOSIS — R29818 Other symptoms and signs involving the nervous system: Secondary | ICD-10-CM

## 2016-11-29 DIAGNOSIS — R2681 Unsteadiness on feet: Secondary | ICD-10-CM

## 2016-11-29 DIAGNOSIS — M6281 Muscle weakness (generalized): Secondary | ICD-10-CM

## 2016-11-29 DIAGNOSIS — I69351 Hemiplegia and hemiparesis following cerebral infarction affecting right dominant side: Secondary | ICD-10-CM | POA: Diagnosis not present
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
## 2016-12-02 ENCOUNTER — Encounter: Payer: Self-pay | Admitting: Physical Therapy

## 2016-12-02 ENCOUNTER — Ambulatory Visit: Payer: BLUE CROSS/BLUE SHIELD | Admitting: Physical Therapy

## 2016-12-02 DIAGNOSIS — R2681 Unsteadiness on feet: Secondary | ICD-10-CM

## 2016-12-02 DIAGNOSIS — I69351 Hemiplegia and hemiparesis following cerebral infarction affecting right dominant side: Secondary | ICD-10-CM | POA: Diagnosis not present

## 2016-12-02 DIAGNOSIS — M6281 Muscle weakness (generalized): Secondary | ICD-10-CM

## 2016-12-02 DIAGNOSIS — R2689 Other abnormalities of gait and mobility: Secondary | ICD-10-CM

## 2016-12-02 NOTE — Patient Instructions (Signed)
1. Work on lying on your left side to sleep.   Sleeping on Side  Place pillow between knees. Bend right leg at hip and knee. Put pillow under Rt arm for support/comfort. Try for at least 30 minutes.   2. HIP: Hamstrings - Long Sitting    Place one leg on surface (foot stool) with knee straight. Lean forward keeping back straight. Hold __30_ seconds. __3_ reps per set,    Copyright  VHI. All rights reserved.
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
## 2016-12-06 ENCOUNTER — Encounter: Payer: Self-pay | Admitting: Occupational Therapy

## 2016-12-06 ENCOUNTER — Ambulatory Visit: Payer: BLUE CROSS/BLUE SHIELD | Admitting: Physical Therapy

## 2016-12-06 ENCOUNTER — Ambulatory Visit: Payer: BLUE CROSS/BLUE SHIELD | Admitting: Occupational Therapy

## 2016-12-06 DIAGNOSIS — I69351 Hemiplegia and hemiparesis following cerebral infarction affecting right dominant side: Secondary | ICD-10-CM | POA: Diagnosis not present

## 2016-12-06 DIAGNOSIS — R29818 Other symptoms and signs involving the nervous system: Secondary | ICD-10-CM

## 2016-12-06 DIAGNOSIS — R482 Apraxia: Secondary | ICD-10-CM

## 2016-12-06 DIAGNOSIS — R41844 Frontal lobe and executive function deficit: Secondary | ICD-10-CM

## 2016-12-06 DIAGNOSIS — R2681 Unsteadiness on feet: Secondary | ICD-10-CM

## 2016-12-06 DIAGNOSIS — M6281 Muscle weakness (generalized): Secondary | ICD-10-CM

## 2016-12-06 DIAGNOSIS — R2689 Other abnormalities of gait and mobility: Secondary | ICD-10-CM
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
## 2016-12-06 NOTE — Patient Instructions (Addendum)
Lumbar Rotation: Caudal - Bilateral (Supine)    Feet and knees together, arms relaxed, rotate knees left, until stretch is felt. Hold __15-30_ seconds. Relax.   Then rotate knees to right, until you feel gentle stretch.  Hold 15-30 seconds. Repeat _3___ times per set. (You will need sister's help for positioning for this exercise.  You can start this exercise by gently rocking side to side for relaxation, then go into the longer hold for a stretch)  http://orth.exer.us/1020   Copyright  VHI. All rights reserved.  Knee to Chest    Lying on your back, bend right knee to chest __3_ times. Hold 15-30 seconds.  You may need sister's help to start to bring leg up towards your body.  You may need sister's help to slowly lower the leg back down into position. Do _1-2__ times per day.  Copyright  VHI. All rights reserved.
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
## 2016-12-09 ENCOUNTER — Ambulatory Visit: Payer: BLUE CROSS/BLUE SHIELD | Admitting: Occupational Therapy

## 2016-12-09 ENCOUNTER — Ambulatory Visit: Payer: BLUE CROSS/BLUE SHIELD | Admitting: Physical Therapy

## 2016-12-09 DIAGNOSIS — I69351 Hemiplegia and hemiparesis following cerebral infarction affecting right dominant side: Secondary | ICD-10-CM | POA: Diagnosis not present

## 2016-12-09 DIAGNOSIS — R29818 Other symptoms and signs involving the nervous system: Secondary | ICD-10-CM

## 2016-12-09 DIAGNOSIS — R278 Other lack of coordination: Secondary | ICD-10-CM

## 2016-12-09 DIAGNOSIS — R2681 Unsteadiness on feet: Secondary | ICD-10-CM

## 2016-12-09 DIAGNOSIS — M6281 Muscle weakness (generalized): Secondary | ICD-10-CM

## 2016-12-09 DIAGNOSIS — R2689 Other abnormalities of gait and mobility: Secondary | ICD-10-CM
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
## 2016-12-09 NOTE — Patient Instructions (Addendum)
  Pick up plastic (empty) cups, bottles from table bring toward your mouth (like drinking) and then release on table (think about putting hand in your lap instead of releasing).  Perform at least 2-3x a day for 5-45min.
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
## 2016-12-13 ENCOUNTER — Encounter: Payer: Self-pay | Admitting: Occupational Therapy

## 2016-12-13 ENCOUNTER — Ambulatory Visit: Payer: BLUE CROSS/BLUE SHIELD | Admitting: Physical Therapy

## 2016-12-13 ENCOUNTER — Ambulatory Visit: Payer: BLUE CROSS/BLUE SHIELD | Attending: Nurse Practitioner | Admitting: Occupational Therapy

## 2016-12-13 DIAGNOSIS — R2689 Other abnormalities of gait and mobility: Secondary | ICD-10-CM | POA: Diagnosis present

## 2016-12-13 DIAGNOSIS — R41844 Frontal lobe and executive function deficit: Secondary | ICD-10-CM

## 2016-12-13 DIAGNOSIS — R29818 Other symptoms and signs involving the nervous system: Secondary | ICD-10-CM | POA: Diagnosis present

## 2016-12-13 DIAGNOSIS — M6281 Muscle weakness (generalized): Secondary | ICD-10-CM | POA: Diagnosis present

## 2016-12-13 DIAGNOSIS — I69351 Hemiplegia and hemiparesis following cerebral infarction affecting right dominant side: Secondary | ICD-10-CM | POA: Diagnosis present

## 2016-12-13 DIAGNOSIS — R482 Apraxia: Secondary | ICD-10-CM | POA: Diagnosis present

## 2016-12-13 DIAGNOSIS — I69318 Other symptoms and signs involving cognitive functions following cerebral infarction: Secondary | ICD-10-CM

## 2016-12-13 DIAGNOSIS — R278 Other lack of coordination: Secondary | ICD-10-CM

## 2016-12-13 DIAGNOSIS — R2681 Unsteadiness on feet: Secondary | ICD-10-CM | POA: Diagnosis present
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
## 2016-12-13 NOTE — Patient Instructions (Signed)
   You need to make sure you are managing your wheelchair with transfers:   -Lock and unlock your own brakes  -Remove and put back the foot rest into place  -Lift your right leg and place it on and off the foot rest    With transfers, you need to make sure your wheelchair is situated so that you are always leading the transfer to your strong (left side)
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
## 2016-12-13 NOTE — Patient Instructions (Signed)
Written instructions for cueing and routine for toilet transfers

## 2016-12-16 ENCOUNTER — Encounter: Payer: Self-pay | Admitting: Occupational Therapy

## 2016-12-16 ENCOUNTER — Ambulatory Visit: Payer: BLUE CROSS/BLUE SHIELD | Admitting: Occupational Therapy

## 2016-12-16 DIAGNOSIS — R2689 Other abnormalities of gait and mobility: Secondary | ICD-10-CM

## 2016-12-16 DIAGNOSIS — R482 Apraxia: Secondary | ICD-10-CM

## 2016-12-16 DIAGNOSIS — I69318 Other symptoms and signs involving cognitive functions following cerebral infarction: Secondary | ICD-10-CM

## 2016-12-16 DIAGNOSIS — R41844 Frontal lobe and executive function deficit: Secondary | ICD-10-CM

## 2016-12-16 DIAGNOSIS — M6281 Muscle weakness (generalized): Secondary | ICD-10-CM

## 2016-12-16 DIAGNOSIS — R2681 Unsteadiness on feet: Secondary | ICD-10-CM

## 2016-12-16 DIAGNOSIS — I69351 Hemiplegia and hemiparesis following cerebral infarction affecting right dominant side: Secondary | ICD-10-CM

## 2016-12-16 DIAGNOSIS — R278 Other lack of coordination: Secondary | ICD-10-CM

## 2016-12-16 DIAGNOSIS — R29818 Other symptoms and signs involving the nervous system: Secondary | ICD-10-CM
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
## 2016-12-17 ENCOUNTER — Ambulatory Visit: Payer: BLUE CROSS/BLUE SHIELD | Admitting: Physical Therapy

## 2016-12-17 DIAGNOSIS — R2681 Unsteadiness on feet: Secondary | ICD-10-CM

## 2016-12-17 DIAGNOSIS — M6281 Muscle weakness (generalized): Secondary | ICD-10-CM | POA: Diagnosis not present
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
## 2016-12-17 NOTE — Patient Instructions (Signed)
Sliding board (ideally with no hand-hold slot), 30 inches long  For transfers wheelchair to bed:  -[place] TO PUSH AWAY THE RIGHT FOOT REST ONCE RIGHT FOOT IS ON THE FLOOR -SCOOT UP TOWARDS THE EDGE OF THE WHEELCHAIR (TRY TO ROCK AND SCOOT SIDE TO SIDE TO PREVENT CUSHION FROM SLIDING) -PLACE RIGHT HAND ON RIGHT THIGH, AND PUSH UP WITH LEFT HAND FROM WHEELCHAIR -STAND UP AND PIVOT CAREFULLY TO THE BED (COULD USE THE HANDRAIL WITH THE LEFT HAND) -SLOWLY SIT DOWN ON THE BED  For transfers bed to wheelchair (using sliding board):  -REMOVE THE LEFT ARMREST BY PULLING IT BACK AND OUT OF THE WAY (MAKE SURE CUSHION IN SQUARELY IN THE CHAIR) -LEAN OVER TO THE LEFT AND PLACE SLIDING BOARD SECURELY UNDER RIGHT BUTTOCKS (THE SLIDING BOARD SHOULD BE ANGLED TOWARD THE RIGHT ARMREST OF THE WHEELCHAIR) -SIT BACK UP TALL -SLOWLY BEGIN SCOOTING TOWARD THE WHEELCHAIR -STOP SCOOTING BEFORE YOU SLIDE AWAY FROM THE BED TO REACH WITH RIGHT HAND FOR THE ARMREST OF THE WHEELCHAIR -SLOWLY, SLOWLY, SLOWLY SCOOT THE REST OF THE WAY DOWN THE BOARD INTO THE WHEELCHAIR -REMOVE THE BOARD -PLACE THE LEFT ARMREST BACK INTO PLACE

## 2016-12-20 ENCOUNTER — Ambulatory Visit: Payer: BLUE CROSS/BLUE SHIELD | Admitting: Physical Therapy

## 2016-12-20 ENCOUNTER — Ambulatory Visit: Payer: BLUE CROSS/BLUE SHIELD | Admitting: Occupational Therapy

## 2016-12-20 DIAGNOSIS — R482 Apraxia: Secondary | ICD-10-CM

## 2016-12-20 DIAGNOSIS — R278 Other lack of coordination: Secondary | ICD-10-CM

## 2016-12-20 DIAGNOSIS — I69318 Other symptoms and signs involving cognitive functions following cerebral infarction: Secondary | ICD-10-CM

## 2016-12-20 DIAGNOSIS — M6281 Muscle weakness (generalized): Secondary | ICD-10-CM | POA: Diagnosis not present

## 2016-12-20 DIAGNOSIS — R41844 Frontal lobe and executive function deficit: Secondary | ICD-10-CM

## 2016-12-20 DIAGNOSIS — R2689 Other abnormalities of gait and mobility: Secondary | ICD-10-CM

## 2016-12-20 DIAGNOSIS — I69351 Hemiplegia and hemiparesis following cerebral infarction affecting right dominant side: Secondary | ICD-10-CM

## 2016-12-20 DIAGNOSIS — R29818 Other symptoms and signs involving the nervous system: Secondary | ICD-10-CM

## 2016-12-20 DIAGNOSIS — R2681 Unsteadiness on feet: Secondary | ICD-10-CM
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
## 2016-12-20 NOTE — Patient Instructions (Addendum)
  For transfers wheelchair to bed:  -[place] RIGHT FOOT REST ONCE RIGHT FOOT IS ON THE FLOOR -[place] (TRY TO ROCK AND SCOOT SIDE TO SIDE TO PREVENT CUSHION FROM SLIDING) -PLACE RIGHT HAND ON RIGHT THIGH, AND PUSH UP WITH LEFT HAND FROM WHEELCHAIR (make sure your feet are shoulder-width apart) -STAND UP AND PIVOT CAREFULLY TO THE BED (COULD USE THE HANDRAIL WITH THE LEFT HAND) -SLOWLY SIT DOWN ON THE BED  For transfers bed to wheelchair (using sliding board):  -REMOVE THE LEFT ARMREST BY PULLING IT BACK AND OUT OF THE WAY (MAKE SURE CUSHION IN SQUARELY IN THE CHAIR) -LEAN OVER TO THE LEFT AND PLACE SLIDING BOARD SECURELY UNDER RIGHT BUTTOCKS (THE SLIDING BOARD SHOULD BE ANGLED TOWARD THE RIGHT ARMREST OF THE WHEELCHAIR) -SIT BACK UP TALL -SLOWLY BEGIN SCOOTING TOWARD THE WHEELCHAIR -STOP SCOOTING BEFORE YOU SLIDE AWAY FROM THE BED TO REACH WITH RIGHT HAND FOR THE ARMREST OF THE WHEELCHAIR (Make sure feet are shoulder width apart) -SLOWLY, SLOWLY, SLOWLY SCOOT THE REST OF THE WAY DOWN THE BOARD INTO THE WHEELCHAIR -REMOVE THE BOARD -PLACE THE LEFT ARMREST BACK INTO PLACE    Sliding board (ideally with no hand-hold slot), 30 inches long  Medical supply stores: Designer, television/film set on [employer] [phone]  [place] on [facility] or in [place] [phone] for both locations  [facility] on [place] [phone]
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
## 2016-12-23 ENCOUNTER — Ambulatory Visit: Payer: BLUE CROSS/BLUE SHIELD | Admitting: Physical Therapy

## 2016-12-23 ENCOUNTER — Ambulatory Visit: Payer: BLUE CROSS/BLUE SHIELD | Admitting: Occupational Therapy

## 2016-12-27 ENCOUNTER — Ambulatory Visit: Payer: BLUE CROSS/BLUE SHIELD | Admitting: Physical Therapy

## 2016-12-27 ENCOUNTER — Ambulatory Visit: Payer: BLUE CROSS/BLUE SHIELD | Admitting: Occupational Therapy

## 2016-12-27 ENCOUNTER — Other Ambulatory Visit: Payer: Self-pay | Admitting: Family Medicine

## 2016-12-27 ENCOUNTER — Encounter: Payer: BLUE CROSS/BLUE SHIELD | Admitting: Occupational Therapy

## 2016-12-27 DIAGNOSIS — R278 Other lack of coordination: Secondary | ICD-10-CM

## 2016-12-27 DIAGNOSIS — R482 Apraxia: Secondary | ICD-10-CM

## 2016-12-27 DIAGNOSIS — R2689 Other abnormalities of gait and mobility: Secondary | ICD-10-CM

## 2016-12-27 DIAGNOSIS — I69318 Other symptoms and signs involving cognitive functions following cerebral infarction: Secondary | ICD-10-CM

## 2016-12-27 DIAGNOSIS — I6329 Cerebral infarction due to unspecified occlusion or stenosis of other precerebral arteries: Secondary | ICD-10-CM

## 2016-12-27 DIAGNOSIS — R29818 Other symptoms and signs involving the nervous system: Secondary | ICD-10-CM

## 2016-12-27 DIAGNOSIS — R2681 Unsteadiness on feet: Secondary | ICD-10-CM

## 2016-12-27 DIAGNOSIS — I69351 Hemiplegia and hemiparesis following cerebral infarction affecting right dominant side: Secondary | ICD-10-CM

## 2016-12-27 DIAGNOSIS — R41844 Frontal lobe and executive function deficit: Secondary | ICD-10-CM

## 2016-12-27 DIAGNOSIS — M6281 Muscle weakness (generalized): Secondary | ICD-10-CM | POA: Diagnosis not present
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
## 2016-12-30 ENCOUNTER — Encounter: Payer: BLUE CROSS/BLUE SHIELD | Admitting: Occupational Therapy

## 2016-12-30 ENCOUNTER — Ambulatory Visit: Payer: BLUE CROSS/BLUE SHIELD | Admitting: Physical Therapy

## 2017-01-03 ENCOUNTER — Encounter: Payer: BLUE CROSS/BLUE SHIELD | Admitting: Occupational Therapy

## 2017-01-03 ENCOUNTER — Ambulatory Visit: Payer: BLUE CROSS/BLUE SHIELD | Admitting: Physical Therapy

## 2017-01-06 ENCOUNTER — Encounter: Payer: BLUE CROSS/BLUE SHIELD | Admitting: Occupational Therapy

## 2017-01-06 ENCOUNTER — Ambulatory Visit: Payer: BLUE CROSS/BLUE SHIELD | Admitting: Physical Therapy

## 2017-01-10 ENCOUNTER — Ambulatory Visit: Payer: BLUE CROSS/BLUE SHIELD | Admitting: Physical Therapy

## 2017-01-10 ENCOUNTER — Encounter: Payer: BLUE CROSS/BLUE SHIELD | Admitting: Occupational Therapy

## 2017-01-12 ENCOUNTER — Encounter: Payer: BLUE CROSS/BLUE SHIELD | Admitting: Occupational Therapy

## 2017-01-12 ENCOUNTER — Ambulatory Visit: Payer: BLUE CROSS/BLUE SHIELD | Admitting: Physical Therapy

## 2017-01-17 ENCOUNTER — Encounter: Payer: BLUE CROSS/BLUE SHIELD | Admitting: Occupational Therapy

## 2017-01-17 ENCOUNTER — Ambulatory Visit: Payer: BLUE CROSS/BLUE SHIELD | Admitting: Physical Therapy

## 2017-01-20 ENCOUNTER — Ambulatory Visit: Payer: BLUE CROSS/BLUE SHIELD | Admitting: Physical Therapy

## 2017-01-28 ENCOUNTER — Other Ambulatory Visit: Payer: Self-pay | Admitting: Family Medicine

## 2017-01-28 DIAGNOSIS — I10 Essential (primary) hypertension: Secondary | ICD-10-CM

## 2017-02-28 ENCOUNTER — Other Ambulatory Visit: Payer: Self-pay | Admitting: Family Medicine

## 2017-02-28 DIAGNOSIS — I10 Essential (primary) hypertension: Secondary | ICD-10-CM

## 2017-03-01 ENCOUNTER — Other Ambulatory Visit: Payer: Self-pay | Admitting: Family Medicine

## 2017-03-01 DIAGNOSIS — I10 Essential (primary) hypertension: Secondary | ICD-10-CM

## 2017-03-14 ENCOUNTER — Other Ambulatory Visit: Payer: Self-pay | Admitting: Nurse Practitioner

## 2017-04-18 ENCOUNTER — Ambulatory Visit: Payer: BLUE CROSS/BLUE SHIELD | Attending: Nurse Practitioner | Admitting: Physical Therapy

## 2017-04-18 ENCOUNTER — Encounter: Payer: Self-pay | Admitting: Physical Therapy

## 2017-04-18 DIAGNOSIS — R29818 Other symptoms and signs involving the nervous system: Secondary | ICD-10-CM | POA: Insufficient documentation

## 2017-04-18 DIAGNOSIS — R2681 Unsteadiness on feet: Secondary | ICD-10-CM | POA: Diagnosis present

## 2017-04-18 DIAGNOSIS — R2689 Other abnormalities of gait and mobility: Secondary | ICD-10-CM | POA: Diagnosis present

## 2017-04-18 DIAGNOSIS — I69351 Hemiplegia and hemiparesis following cerebral infarction affecting right dominant side: Secondary | ICD-10-CM | POA: Insufficient documentation

## 2017-04-18 DIAGNOSIS — M6281 Muscle weakness (generalized): Secondary | ICD-10-CM | POA: Diagnosis present

## 2017-04-19 ENCOUNTER — Encounter: Payer: Self-pay | Admitting: Physical Therapy
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
## 2017-04-21 ENCOUNTER — Ambulatory Visit: Payer: BLUE CROSS/BLUE SHIELD | Admitting: Occupational Therapy

## 2017-04-21 ENCOUNTER — Ambulatory Visit: Payer: BLUE CROSS/BLUE SHIELD | Admitting: Physical Therapy

## 2017-04-25 ENCOUNTER — Encounter: Payer: Self-pay | Admitting: Physical Therapy

## 2017-04-25 ENCOUNTER — Ambulatory Visit: Payer: BLUE CROSS/BLUE SHIELD | Admitting: Physical Therapy

## 2017-04-25 DIAGNOSIS — M6281 Muscle weakness (generalized): Secondary | ICD-10-CM

## 2017-04-25 DIAGNOSIS — I69351 Hemiplegia and hemiparesis following cerebral infarction affecting right dominant side: Secondary | ICD-10-CM | POA: Diagnosis not present

## 2017-04-25 DIAGNOSIS — R2689 Other abnormalities of gait and mobility: Secondary | ICD-10-CM
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
## 2017-04-28 ENCOUNTER — Ambulatory Visit: Payer: BLUE CROSS/BLUE SHIELD | Admitting: Physical Therapy

## 2017-04-28 ENCOUNTER — Ambulatory Visit: Payer: BLUE CROSS/BLUE SHIELD | Admitting: Occupational Therapy

## 2017-05-02 ENCOUNTER — Ambulatory Visit: Payer: BLUE CROSS/BLUE SHIELD | Admitting: Physical Therapy

## 2017-05-02 DIAGNOSIS — R2689 Other abnormalities of gait and mobility: Secondary | ICD-10-CM

## 2017-05-02 DIAGNOSIS — I69351 Hemiplegia and hemiparesis following cerebral infarction affecting right dominant side: Secondary | ICD-10-CM

## 2017-05-02 DIAGNOSIS — R29818 Other symptoms and signs involving the nervous system: Secondary | ICD-10-CM
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
## 2017-05-03 ENCOUNTER — Ambulatory Visit: Payer: BLUE CROSS/BLUE SHIELD | Admitting: Occupational Therapy

## 2017-05-03 ENCOUNTER — Ambulatory Visit: Payer: BLUE CROSS/BLUE SHIELD | Admitting: Physical Therapy

## 2017-05-09 ENCOUNTER — Ambulatory Visit: Payer: BLUE CROSS/BLUE SHIELD | Admitting: Physical Therapy

## 2017-05-12 ENCOUNTER — Ambulatory Visit: Payer: BLUE CROSS/BLUE SHIELD | Admitting: Physical Therapy

## 2017-05-12 ENCOUNTER — Ambulatory Visit: Payer: BLUE CROSS/BLUE SHIELD | Admitting: Occupational Therapy

## 2017-05-12 DIAGNOSIS — I69351 Hemiplegia and hemiparesis following cerebral infarction affecting right dominant side: Secondary | ICD-10-CM | POA: Diagnosis not present

## 2017-05-12 DIAGNOSIS — R2689 Other abnormalities of gait and mobility: Secondary | ICD-10-CM

## 2017-05-12 DIAGNOSIS — R29818 Other symptoms and signs involving the nervous system: Secondary | ICD-10-CM
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
## 2017-05-13 ENCOUNTER — Encounter: Payer: Self-pay | Admitting: Physical Therapy
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
## 2017-05-16 ENCOUNTER — Ambulatory Visit: Payer: BLUE CROSS/BLUE SHIELD | Admitting: Physical Therapy

## 2017-05-23 ENCOUNTER — Encounter: Payer: BLUE CROSS/BLUE SHIELD | Admitting: Occupational Therapy

## 2017-05-23 ENCOUNTER — Ambulatory Visit: Payer: BLUE CROSS/BLUE SHIELD | Admitting: Nurse Practitioner

## 2017-05-23 ENCOUNTER — Ambulatory Visit: Payer: BLUE CROSS/BLUE SHIELD | Admitting: Physical Therapy

## 2017-05-30 ENCOUNTER — Ambulatory Visit: Payer: BLUE CROSS/BLUE SHIELD | Attending: Nurse Practitioner | Admitting: Physical Therapy

## 2017-05-30 ENCOUNTER — Encounter: Payer: BLUE CROSS/BLUE SHIELD | Admitting: Occupational Therapy

## 2017-05-30 DIAGNOSIS — R29818 Other symptoms and signs involving the nervous system: Secondary | ICD-10-CM | POA: Insufficient documentation

## 2017-05-30 DIAGNOSIS — I69351 Hemiplegia and hemiparesis following cerebral infarction affecting right dominant side: Secondary | ICD-10-CM | POA: Insufficient documentation

## 2017-05-30 DIAGNOSIS — R2689 Other abnormalities of gait and mobility: Secondary | ICD-10-CM | POA: Diagnosis not present

## 2017-05-31 ENCOUNTER — Encounter: Payer: Self-pay | Admitting: Physical Therapy
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
## 2017-05-31 NOTE — Patient Instructions (Signed)
Butterfly, Supine    Lie on back, feet together. Lower knees toward floor. Hold _3__ seconds. Repeat _10__ times per session. Do _1__ sessions per day.  Copyright  VHI. All rights reserved.  HIP: Flexors - Supine    Lie on edge of surface. Place leg off the surface, allow knee to bend. Bring other knee toward chest. Hold _30__ seconds. __2_ reps per set, _1__ sets per day, _5__ days per week Rest lowered foot on stool.  (RIGHT LEG OFF SIDE OF BED FOR STRETCHING _ HOLD 30-45 secs)   Can lift Right leg on/off side of bed for strengthening - 10 reps  Bridging    Slowly raise buttocks from floor, keeping stomach tight. Repeat _10___ times per set. Do _1__ sets per session. Do 1-2____ sessions per day.  http://orth.exer.us/1096   Copyright  VHI. All rights reserved.    Copyright  VHI. All rights reserved.  CAREGIVER ASSISTED: Hip / Knee Flexion    Caregiver holds leg at knee and heel; raises knee to chest. Hold _5__ seconds. _10__ reps per set, _1__ sets per day, _5__ days per week  GIVE RESISTANCE with the PULL UP and PUSH OUT   Copyright  VHI. All rights reserved.  Functional Quadriceps: Sit to Stand    Sit on edge of chair, feet flat on floor. Stand upright, extending knees fully. Repeat _8-10___ times per set. Do _1___ sets per session. Do __1__ sessions per day.  http://orth.exer.us/734   Copyright  VHI. All rights reserved.

## 2017-06-06 ENCOUNTER — Encounter: Payer: BLUE CROSS/BLUE SHIELD | Admitting: Occupational Therapy
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
## 2017-06-09 ENCOUNTER — Encounter: Payer: Self-pay | Admitting: Nurse Practitioner

## 2017-06-09 ENCOUNTER — Ambulatory Visit: Payer: BLUE CROSS/BLUE SHIELD | Admitting: Nurse Practitioner

## 2017-06-09 VITALS — BP 116/80 | HR 65 | Ht 72.0 in | Wt 222.5 lb

## 2017-06-09 DIAGNOSIS — R569 Unspecified convulsions: Secondary | ICD-10-CM

## 2017-06-09 DIAGNOSIS — Z8673 Personal history of transient ischemic attack (TIA), and cerebral infarction without residual deficits: Secondary | ICD-10-CM | POA: Diagnosis not present

## 2017-06-09 DIAGNOSIS — F17219 Nicotine dependence, cigarettes, with unspecified nicotine-induced disorders: Secondary | ICD-10-CM

## 2017-06-09 DIAGNOSIS — G8191 Hemiplegia, unspecified affecting right dominant side: Secondary | ICD-10-CM

## 2017-06-09 DIAGNOSIS — F1010 Alcohol abuse, uncomplicated: Secondary | ICD-10-CM | POA: Diagnosis not present

## 2017-06-09 DIAGNOSIS — I69322 Dysarthria following cerebral infarction: Secondary | ICD-10-CM

## 2017-06-09 DIAGNOSIS — I10 Essential (primary) hypertension: Secondary | ICD-10-CM

## 2017-06-09 MED ORDER — LEVETIRACETAM 500 MG PO TABS: ORAL_TABLET | ORAL | 6 refills | Status: DC

## 2017-06-09 NOTE — Patient Instructions (Signed)
Management of stroke risk factors Continue aspirin for secondary stroke prevention Continue Lipitor for secondary stroke prevention, LDL to be monitored by PCP Keep Systolic blood pressure less than 130 today's reading  116/80 continue Norvasc and lisinopril and Lopressor  Increase  Keppra  To tabs twice daily (1000mg ) for seizure disorder will refill Continue  PT and OT and HEP in between visits Follow-up with me in 6 months

## 2017-06-09 NOTE — Progress Notes (Signed)
Personally  participated in, made any corrections needed, and agree with history, physical, neuro exam,assessment and plan as stated above.    Maudy Yonan, MD [facility] 

## 2017-06-10 NOTE — Progress Notes (Signed)
Fax confirmation received for [employer], [phone].

## 2017-06-20 ENCOUNTER — Ambulatory Visit: Payer: BLUE CROSS/BLUE SHIELD | Attending: Nurse Practitioner | Admitting: Physical Therapy

## 2017-06-20 ENCOUNTER — Encounter: Payer: Self-pay | Admitting: Physical Therapy

## 2017-06-20 DIAGNOSIS — R2689 Other abnormalities of gait and mobility: Secondary | ICD-10-CM | POA: Diagnosis not present

## 2017-06-20 DIAGNOSIS — M6281 Muscle weakness (generalized): Secondary | ICD-10-CM | POA: Diagnosis present

## 2017-06-20 DIAGNOSIS — R29818 Other symptoms and signs involving the nervous system: Secondary | ICD-10-CM | POA: Insufficient documentation

## 2017-06-20 DIAGNOSIS — I69351 Hemiplegia and hemiparesis following cerebral infarction affecting right dominant side: Secondary | ICD-10-CM | POA: Insufficient documentation

## 2017-06-20 DIAGNOSIS — R2681 Unsteadiness on feet: Secondary | ICD-10-CM | POA: Insufficient documentation
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
## 2017-06-23 ENCOUNTER — Ambulatory Visit: Payer: BLUE CROSS/BLUE SHIELD | Admitting: Physical Therapy

## 2017-06-23 DIAGNOSIS — R29818 Other symptoms and signs involving the nervous system: Secondary | ICD-10-CM

## 2017-06-23 DIAGNOSIS — I69351 Hemiplegia and hemiparesis following cerebral infarction affecting right dominant side: Secondary | ICD-10-CM

## 2017-06-23 DIAGNOSIS — R2689 Other abnormalities of gait and mobility: Secondary | ICD-10-CM

## 2017-06-24 ENCOUNTER — Encounter: Payer: Self-pay | Admitting: Physical Therapy
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
## 2017-06-30 ENCOUNTER — Ambulatory Visit: Payer: BLUE CROSS/BLUE SHIELD | Admitting: Physical Therapy

## 2017-06-30 DIAGNOSIS — R2689 Other abnormalities of gait and mobility: Secondary | ICD-10-CM | POA: Diagnosis not present

## 2017-06-30 DIAGNOSIS — R29818 Other symptoms and signs involving the nervous system: Secondary | ICD-10-CM

## 2017-06-30 NOTE — Patient Instructions (Signed)
Hip Abduction / Adduction: with Knee Flexion (Supine)    With right knee bent, gently lower knee to side and return. Repeat __10__ times per set. Do _1__ sets per session. Do __1__ sessions per day.  http://orth.exer.us/682   Copyright  VHI. All rights reserved.  nee to Chest: Advanced    Lie with one leg straight, one bent. Bring bent knee up. Be sure pelvis does not tilt or rotate. Lift knee _10__ times. Restabilize pelvis. Repeat with other leg. Do 1___ sets, 1___ times per day.  ALSO DO KNEE TO CHEST - PUSH/PULL  (YOU REALLY HAVE TO PUSH) - 10-15 times  http://ss.exer.us/10   Copyright  VHI. All rights reserved.  Bridging    Slowly raise buttocks from floor, keeping stomach tight. Repeat __10__ times per set. Do __1__ sets per session. Do _1___ sessions per day.  http://orth.exer.us/1096   Copyright  VHI. All rights reserved.

## 2017-07-01 ENCOUNTER — Encounter: Payer: Self-pay | Admitting: Physical Therapy
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
## 2017-07-05 ENCOUNTER — Ambulatory Visit: Payer: BLUE CROSS/BLUE SHIELD | Admitting: Physical Therapy

## 2017-07-05 DIAGNOSIS — R2689 Other abnormalities of gait and mobility: Secondary | ICD-10-CM | POA: Diagnosis not present

## 2017-07-05 DIAGNOSIS — R29818 Other symptoms and signs involving the nervous system: Secondary | ICD-10-CM

## 2017-07-06 ENCOUNTER — Encounter: Payer: Self-pay | Admitting: Physical Therapy
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
## 2017-07-07 ENCOUNTER — Encounter: Payer: Self-pay | Admitting: Physical Therapy

## 2017-07-07 ENCOUNTER — Ambulatory Visit: Payer: BLUE CROSS/BLUE SHIELD | Admitting: Physical Therapy

## 2017-07-07 DIAGNOSIS — R2689 Other abnormalities of gait and mobility: Secondary | ICD-10-CM

## 2017-07-07 DIAGNOSIS — R29818 Other symptoms and signs involving the nervous system: Secondary | ICD-10-CM

## 2017-07-07 DIAGNOSIS — M6281 Muscle weakness (generalized): Secondary | ICD-10-CM
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
## 2017-07-12 ENCOUNTER — Ambulatory Visit: Payer: BLUE CROSS/BLUE SHIELD | Admitting: Physical Therapy

## 2017-07-14 ENCOUNTER — Encounter: Payer: Self-pay | Admitting: Physical Therapy

## 2017-07-14 ENCOUNTER — Ambulatory Visit: Payer: BLUE CROSS/BLUE SHIELD | Admitting: Physical Therapy

## 2017-07-14 DIAGNOSIS — R2689 Other abnormalities of gait and mobility: Secondary | ICD-10-CM | POA: Diagnosis not present

## 2017-07-14 DIAGNOSIS — R29818 Other symptoms and signs involving the nervous system: Secondary | ICD-10-CM

## 2017-07-14 DIAGNOSIS — M6281 Muscle weakness (generalized): Secondary | ICD-10-CM
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
## 2017-07-18 ENCOUNTER — Encounter: Payer: Self-pay | Admitting: Physical Therapy

## 2017-07-18 ENCOUNTER — Ambulatory Visit: Payer: BLUE CROSS/BLUE SHIELD | Attending: Nurse Practitioner | Admitting: Physical Therapy

## 2017-07-18 DIAGNOSIS — R29818 Other symptoms and signs involving the nervous system: Secondary | ICD-10-CM | POA: Diagnosis present

## 2017-07-18 DIAGNOSIS — I69351 Hemiplegia and hemiparesis following cerebral infarction affecting right dominant side: Secondary | ICD-10-CM | POA: Insufficient documentation

## 2017-07-18 DIAGNOSIS — R2689 Other abnormalities of gait and mobility: Secondary | ICD-10-CM | POA: Diagnosis not present

## 2017-07-18 DIAGNOSIS — M6281 Muscle weakness (generalized): Secondary | ICD-10-CM
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
## 2017-07-21 ENCOUNTER — Ambulatory Visit: Payer: BLUE CROSS/BLUE SHIELD | Admitting: Physical Therapy

## 2017-07-25 ENCOUNTER — Ambulatory Visit: Payer: BLUE CROSS/BLUE SHIELD | Admitting: Physical Therapy

## 2017-07-25 ENCOUNTER — Encounter: Payer: Self-pay | Admitting: Physical Therapy

## 2017-07-25 DIAGNOSIS — R2689 Other abnormalities of gait and mobility: Secondary | ICD-10-CM

## 2017-07-25 DIAGNOSIS — M6281 Muscle weakness (generalized): Secondary | ICD-10-CM

## 2017-07-25 DIAGNOSIS — R29818 Other symptoms and signs involving the nervous system: Secondary | ICD-10-CM
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
## 2017-07-28 ENCOUNTER — Ambulatory Visit: Payer: BLUE CROSS/BLUE SHIELD | Admitting: Physical Therapy

## 2017-07-28 DIAGNOSIS — R2689 Other abnormalities of gait and mobility: Secondary | ICD-10-CM

## 2017-07-28 DIAGNOSIS — M6281 Muscle weakness (generalized): Secondary | ICD-10-CM

## 2017-07-29 ENCOUNTER — Encounter: Payer: Self-pay | Admitting: Physical Therapy
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
## 2017-08-04 ENCOUNTER — Ambulatory Visit: Payer: BLUE CROSS/BLUE SHIELD | Admitting: Physical Therapy

## 2017-08-04 ENCOUNTER — Encounter: Payer: Self-pay | Admitting: Physical Therapy

## 2017-08-04 DIAGNOSIS — I69351 Hemiplegia and hemiparesis following cerebral infarction affecting right dominant side: Secondary | ICD-10-CM

## 2017-08-04 DIAGNOSIS — R2689 Other abnormalities of gait and mobility: Secondary | ICD-10-CM

## 2017-08-04 DIAGNOSIS — M6281 Muscle weakness (generalized): Secondary | ICD-10-CM

## 2017-08-04 NOTE — Therapy (Signed)
[facility] [address] [place], [name], [address] Phone: [phone]   Fax:  [phone]  Physical Therapy Treatment  Patient Details  Name: Gerald [name] MRN: [id number] Date of Birth: 01-Oct-1960 Referring Provider: Cecille Rubin, NP   Encounter Date: 08/04/2017  PT End of Session - 08/04/17 1652    Visit Number  10    Number of Visits  15    Date for PT Re-Evaluation  08/22/17    Authorization Type  BCBS 30 visit OT/PT combined visit limit    Authorization - Visit Number  10    Authorization - Number of Visits  30    PT Start Time  0803    PT Stop Time  5366    PT Time Calculation (min)  55 min    Equipment Utilized During Treatment  Gait belt       Past Medical History:  Diagnosis Date  . Alcohol abuse   . Cocaine abuse ([place]) 2014  . Hypertension   . Seizures ([place])   . Stroke [facility]) 2014   denies residual on 07/03/2015  . Stroke ([place]) 07/02/2015   "now weak on right side; speech problems" (07/03/2015)  . Tobacco use     Past Surgical History:  Procedure Laterality Date  . SKIN GRAFT Bilateral 1980s   "got burned by some hot water"    There were no vitals filed for this visit.  Subjective Assessment - 08/04/17 1609    Subjective  Pt states he has been standing some at home at counter but not a lot - has not tried taking any steps    Patient is accompained by:  Family member    Pertinent History  ETOH, cocaine abuse, HTN, seizures, CVA    Patient Stated Goals  Pt's goal for therapy is to walk and move arm and leg better.    Currently in Pain?  No/denies                      [facility] Adult PT Treatment/Exercise - 08/04/17 0001      Bed Mobility   Bed Mobility  Sit to Supine    Sit to Supine  3: Mod assist      Transfers   Transfers  Sit to Stand;Stand to Sit    Sit to Stand  4: Min assist;3: Mod assist    Sit to Stand Details  Verbal cues for sequencing;Verbal cues for  technique    Stand to Sit  4: Min assist      Ambulation/Gait   Ambulation/Gait  Yes    Ambulation/Gait Assistance  3: Mod assist    Ambulation/Gait Assistance Details  blue shoe cover on Rt shoe with 1/2" shoe lift on Lt shoe for incr. clearance RLE in swing through of gait    Ambulation Distance (Feet)  10 Feet 15', 17'    Assistive device  Rolling walker    Gait Pattern  Step-to pattern;Decreased stance time - right;Decreased hip/knee flexion - right;Decreased dorsiflexion - right;Decreased weight shift to right;Lateral trunk lean to left;Poor foot clearance - right;Right genu recurvatum Pt tries to take second step on LLE prior to advancing RLE    Ambulation Surface  Level;Indoor    Gait Comments  legs tremor with fatigue - pt needs cues to stand upright and to extend hips      Lumbar Exercises: Stretches   Single Knee to Chest Stretch  Right;2 reps;20 seconds    Lower  Trunk Rotation  1 rep;10 seconds      Lumbar Exercises: Supine   Clam  15 reps    Bent Knee Raise  15 reps    Bridge  10 reps    Other Supine Lumbar Exercises  Rt 1/2 bridge 15 reps     Other Supine Lumbar Exercises  Rt hip extension control off side of mat - 15 reps with min assist             PT Education - 08/04/17 1644    Education provided  Yes    Education Details  educated pt's sister, Gerald [name], on technique for standing at sink; added stepping laterally with Lt foot for incr. RLE weight bearing    Person(s) Educated  Patient;Caregiver(s)    Methods  Explanation;Demonstration    Comprehension  Verbalized understanding;Returned demonstration          PT Long Term Goals - 06/20/17 2019      PT LONG TERM GOAL #1   Title  Pt will perform updated HEP with family supervision for improved strength, balance and gait.      Time  4    Period  Weeks    Status  New    Target Date  07/21/17      PT LONG TERM GOAL #2   Title  Pt will perform sit<>stand transfers with supervision, for improved sit<>stand  transfers from wheelchair.    Baseline  Min-Mod assistance with cues for hand placement for sit<>stand transfers from wheelchair    Time  4    Period  Weeks    Status  New    Target Date  07/21/17      PT LONG TERM GOAL #3   Title  Pt will be able to stand at sink/counter for at least 5 minutes with UE support and supervision, for improved standing and ADL participation.    Time  4    Period  Weeks    Status  New    Target Date  07/21/17      PT LONG TERM GOAL #4   Title  Pt will ambulate at least 25 ft using RW, with minimal assistance, for improved household gait.    Time  4    Period  Weeks    Status  New            Plan - 08/04/17 1654    Clinical Impression Statement  Pt improved with gait after weight bearing in standing at sink and after 1st rep of gait training; shoe lift on LLE was added after 1st rep completed to increase ease with RLE swing through and this did help to make it easier to advance RLE in swing phase     Rehab Potential  Fair    Clinical Impairments Affecting Rehab Potential  >1 year since CVA; pt not currently performing previous PT HEP at home    PT Frequency  2x / week    PT Duration  4 weeks    PT Treatment/Interventions  ADLs/Self Care Home Management;Gait training;DME Instruction;Functional mobility training;Therapeutic activities;Therapeutic exercise;Balance training;Neuromuscular re-education;Orthotic Fit/Training;Patient/family education    PT Next Visit Plan  Check LTG's - renew; cont RLE strengthening and gait training - try shoe lift on LLE to assist with Rt swing thru; check HEP - see it pt attempted stepping with LLE at sink at home    PT Home Exercise Plan  see pt instructions - gave pics on 06-30-17; reissued HEP to pt's sister Gerald [name]  on 08-04-17    Consulted and Agree with Plan of Care  Patient;Family member/caregiver    Family Member Consulted  sister - Gerald [name]       Patient will benefit from skilled therapeutic intervention in order to improve  the following deficits and impairments:  Abnormal gait, Decreased mobility, Decreased balance, Decreased knowledge of use of DME, Decreased endurance, Decreased range of motion, Decreased safety awareness, Decreased strength, Difficulty walking, Postural dysfunction, Impaired tone  Visit Diagnosis: Other abnormalities of gait and mobility  Muscle weakness (generalized)  Hemiplegia and hemiparesis following cerebral infarction affecting right dominant side [facility])     Problem List Patient Active Problem List   Diagnosis Date Noted  . History of stroke 06/09/2017  . Seizure ([place]) 05/22/2016  . Anxiety state 04/20/2016  . Spastic hemiplegia affecting dominant side ([place]) 09/11/2015  . Infection of wound due to methicillin resistant Staphylococcus aureus (MRSA)   . 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 Right hemiparesis ([place]) 07/08/2015  . Acute left ACA ischemic stroke ([place]) 07/08/2015  . Cerebrovascular accident (CVA) ([place])   . Stroke ([place]) 07/02/2015  . Acute ischemic stroke ([place]) 07/02/2015  . CVA (cerebral infarction) 01/15/2013  . Essential hypertension 01/15/2013  . Nicotine dependence 01/15/2013  . Alcohol abuse 01/15/2013    Alda Lea, PT 08/04/2017, 5:01 PM  [facility] [address] [place], [name], [address] Phone: [phone]   Fax:  [phone]  Name: Gerald [name] MRN: [id number] Date of Birth: Jun 29, 1960

## 2017-08-08 ENCOUNTER — Encounter: Payer: Self-pay | Admitting: Physical Medicine & Rehabilitation

## 2017-08-08 ENCOUNTER — Encounter: Payer: BLUE CROSS/BLUE SHIELD | Attending: Physical Medicine & Rehabilitation

## 2017-08-08 ENCOUNTER — Ambulatory Visit: Payer: BLUE CROSS/BLUE SHIELD | Admitting: Physical Therapy

## 2017-08-08 ENCOUNTER — Ambulatory Visit: Payer: BLUE CROSS/BLUE SHIELD | Admitting: Physical Medicine & Rehabilitation

## 2017-08-08 VITALS — BP 136/87 | HR 52

## 2017-08-08 DIAGNOSIS — I69351 Hemiplegia and hemiparesis following cerebral infarction affecting right dominant side: Secondary | ICD-10-CM | POA: Insufficient documentation

## 2017-08-08 DIAGNOSIS — G8111 Spastic hemiplegia affecting right dominant side: Secondary | ICD-10-CM | POA: Diagnosis not present

## 2017-08-08 DIAGNOSIS — F17210 Nicotine dependence, cigarettes, uncomplicated: Secondary | ICD-10-CM | POA: Insufficient documentation

## 2017-08-08 DIAGNOSIS — G8110 Spastic hemiplegia affecting unspecified side: Secondary | ICD-10-CM | POA: Diagnosis not present

## 2017-08-08 DIAGNOSIS — I6329 Cerebral infarction due to unspecified occlusion or stenosis of other precerebral arteries: Secondary | ICD-10-CM

## 2017-08-08 NOTE — Progress Notes (Signed)
Subjective:    Patient ID: Gerald [name], male    DOB: 04-22-1961, 57 y.o.   MRN: [id number]  HPI  57 year old male with history of recurrent CVA onset January 2017 in the left ACA distribution.  He has had problems with right upper limb and right lower limb spasticity.  He has undergone botulinum toxin injections to the quadricep as well as the hamstring muscles groups.  He is also had elbow flexor muscle groups injected. His last injection was in April 2018. Told by PT to have re eval due to increased tone on Right side   Botox injection performed on 07/16/2016 Biceps75 Brachialis 75 VMO 50 Vast Int 50 Vast Lat 50 Rect Fem 50  Hamstrings50 Pain Inventory Average Pain 7 Pain Right Now 0 My pain is aching  In the last 24 hours, has pain interfered with the following? General activity 7 Relation with others 7 Enjoyment of life 10 What TIME of day is your pain at its worst? all Sleep (in general) Good  Pain is worse with: walking, inactivity and standing Pain improves with: . Relief from Meds: 0  Mobility do you drive?  no  Function I need assistance with the following:  toileting and meal prep  Neuro/Psych spasms  Prior Studies Any changes since last visit?  no  Physicians involved in your care Any changes since last visit?  no   Family History  Problem Relation Age of Onset  . Hypertension Mother   . Hypertension Father   . Stroke Paternal Aunt    Social History   Socioeconomic History  . Marital status: Significant Other    Spouse name: Not on file  . Number of children: Not on file  . Years of education: Not on file  . Highest education level: Not on file  Social Needs  . Financial resource strain: Not on file  . Food insecurity - worry: Not on file  . Food insecurity - inability: Not on file  . Transportation needs - medical: Not on file  . Transportation needs - non-medical: Not on file  Occupational History  . Not on file  Tobacco Use    . Smoking status: Current Some Day Smoker    Packs/day: 0.25    Years: 34.00    Pack years: 8.50    Types: Cigarettes    Last attempt to quit: 07/01/2015    Years since quitting: 2.1  . Smokeless tobacco: Never Used  . Tobacco comment: smokes 1-2 cigarette daily/fim  Substance and Sexual Activity  . Alcohol use: Yes    Alcohol/week: 0.6 - 1.2 oz    Types: 1 - 2 Cans of beer per week    Comment: everyday  . Drug use: No    Comment: 07/03/2015 "hasn't had any for some time"  . Sexual activity: Yes  Other Topics Concern  . Not on file  Social History Narrative  . Not on file   Past Surgical History:  Procedure Laterality Date  . SKIN GRAFT Bilateral 1980s   "got burned by some hot water"   Past Medical History:  Diagnosis Date  . Alcohol abuse   . Cocaine abuse ([place]) 2014  . Hypertension   . Seizures ([place])   . Stroke [facility]) 2014   denies residual on 07/03/2015  . Stroke ([place]) 07/02/2015   "now weak on right side; speech problems" (07/03/2015)  . Tobacco use    There were no vitals taken for this visit.  Opioid Risk Score:  Fall Risk Score:  `1  Depression screen PHQ 2/9  Depression screen [facility] 2/9 04/20/2016 01/29/2016 12/02/2015 09/18/2015 08/29/2015 08/20/2015 08/08/2015  Decreased Interest 2 0 2 0 2 0 0  Down, Depressed, Hopeless 2 0 0 0 0 0 2  PHQ - 2 Score 4 0 2 0 2 0 2  Altered sleeping 0 - 0 0 0 - 0  Tired, decreased energy 2 - 0 0 0 - 1  Change in appetite 0 - 0 0 0 - 0  Feeling bad or failure about yourself  0 - 0 0 0 - 0  Trouble concentrating 0 - 0 0 0 - 3  Moving slowly or fidgety/restless 0 - 0 0 3 - 3  Suicidal thoughts 0 - 0 0 0 - 0  PHQ-9 Score 6 - 2 0 5 - 9     Review of Systems  Constitutional: Negative.   HENT: Negative.   Eyes: Negative.   Respiratory: Positive for shortness of breath.   Cardiovascular: Negative.   Gastrointestinal: Negative.   Endocrine: Negative.   Genitourinary: Negative.   Musculoskeletal: Positive for gait problem.   Skin: Negative.   Allergic/Immunologic: Negative.   Hematological: Negative.   Psychiatric/Behavioral: Negative.   All other systems reviewed and are negative.      Objective:   Physical Exam  Constitutional: He is oriented to person, place, and time. He appears well-developed and well-nourished.  HENT:  Head: Normocephalic and atraumatic.  Eyes: Conjunctivae and EOM are normal. Pupils are equal, round, and reactive to light.  Neurological: He is alert and oriented to person, place, and time.  Skin: Skin is warm and dry.  Nursing note and vitals reviewed.   Motor strength is 3- in the right deltoid bicep tricep finger flexors and extensor with poor motor control Right lower extremity to minus in the hip flexor 3- hip knee extensor synergy trace ankle Tone MAS 3 at the right elbow flexors MAS 1 at the finger and wrist flexors MAS 2 in the quadriceps, MAS 3 in the hamstrings      Assessment & Plan:  1.  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  Patient is good candidate to resume botulinum toxin injections.  Tone elevated mainly in Hamstring will increase dose to 150 U 25 U to each quad 75 U to biceps and 75 to Brachialis

## 2017-08-08 NOTE — Patient Instructions (Signed)
Don't use salt shaker for the next month  If swelling not improved ask primary MD about stopping amlodipine.

## 2017-08-11 ENCOUNTER — Encounter: Payer: Self-pay | Admitting: Physical Therapy

## 2017-08-11 ENCOUNTER — Ambulatory Visit: Payer: BLUE CROSS/BLUE SHIELD | Admitting: Physical Therapy

## 2017-08-11 DIAGNOSIS — R2689 Other abnormalities of gait and mobility: Secondary | ICD-10-CM

## 2017-08-11 DIAGNOSIS — I69351 Hemiplegia and hemiparesis following cerebral infarction affecting right dominant side: Secondary | ICD-10-CM

## 2017-08-11 NOTE — Therapy (Signed)
[facility] [address] [place], [name], [address] Phone: [phone]   Fax:  [phone]  Physical Therapy Treatment  Patient Details  Name: Gerald [name] MRN: [id number] Date of Birth: 01-Apr-1961 Referring Provider: Cecille Rubin, NP   Encounter Date: 08/11/2017  PT End of Session - 08/11/17 1218    Visit Number  -- No charge for today's session due to pt arriving late and signfiicantly increased edema in RLE    Authorization Type  BCBS 30 visit OT/PT combined visit limit    Authorization - Visit Number  10    Authorization - Number of Visits  30    PT Start Time  [phone] pt arrived 5" late    PT Stop Time  0827    PT Time Calculation (min)  21 min       Past Medical History:  Diagnosis Date  . Alcohol abuse   . Cocaine abuse ([place]) 2014  . Hypertension   . Seizures ([place])   . Stroke [facility]) 2014   denies residual on 07/03/2015  . Stroke ([place]) 07/02/2015   "now weak on right side; speech problems" (07/03/2015)  . Tobacco use     Past Surgical History:  Procedure Laterality Date  . SKIN GRAFT Bilateral 1980s   "got burned by some hot water"    There were no vitals filed for this visit.  Subjective Assessment - 08/11/17 1208    Subjective  Pt reports Rt leg is very swollen today; sister accompanying pt to PT - says she had hard time getting shoe on Rt foot due to edema; sister reports they are running late due to difficulty getting pt in her Explorer - states she is going to go home and switch out vehicles due to the difficulty she had getting pt in the SUV Sister states pt is going to have Botox if a few weeks and then again in about 3 months    Patient is accompained by:  Family member    Pertinent History  ETOH, cocaine abuse, HTN, seizures, CVA    Patient Stated Goals  Pt's goal for therapy is to walk and move arm and leg better.    Currently in Pain?  No/denies        No Charge as pt arrives late and  has significantly increased edema in RLE;  Sister reports she had very difficult time transferring pt into her [employer] and states she is going back home to trade out vehicles; after discussion with pt and sister Regarding plan from Dr. Letta Pate to do Botox in approx, 3 weeks and then again in 3 months it was requested to cancel today's session and maximize visits  I offered to assist with transferring pt into current vehicle [employer]) so that she would not have to go back home to trade vehicles  Pt and sister agree to D/C PT at this time to maximize visits for remaining calendar year - most optimal to have PT visits after pt has received Botox injections                            PT Long Term Goals - 08/11/17 1235      PT LONG TERM GOAL #1   Title  Pt will perform updated HEP with family supervision for improved strength, balance and gait.      Baseline  performing HEP with family supervision - 08-11-17  Status  Achieved      PT LONG TERM GOAL #2   Title  Pt will perform sit<>stand transfers with supervision, for improved sit<>stand transfers from wheelchair.    Baseline  Min-Mod assistance with cues for hand placement for sit<>stand transfers from wheelchair -08-11-17    Status  Not Met      PT LONG TERM GOAL #3   Title  Pt will be able to stand at sink/counter for at least 5 minutes with UE support and supervision, for improved standing and ADL participation.    Baseline  pt standing approx. 1-2 minutes with min assist - 08-11-17    Status  Not Met      PT LONG TERM GOAL #4   Title  Pt will ambulate at least 25 ft using RW, with minimal assistance, for improved household gait.    Baseline  unable to advance RLE on 08-11-17    Status  Not Met      PT LONG TERM GOAL #5   Title  Pt will improve FOTO status score by at least 10%, to demonstrate overall improved functional mobility.    Status  Deferred            Plan - 08/11/17 1224     Clinical Impression Statement  No charge for today's session due to pt arriving late and signficant edema in RLE and also due to sister's report of planned Botox injection in 3 weeks for RLE tightness;  wish to maximize visits after Botox injections received    PT Treatment/Interventions  ADLs/Self Care Home Management;Gait training;DME Instruction;Functional mobility training;Therapeutic activities;Therapeutic exercise;Balance training;Neuromuscular re-education;Orthotic Fit/Training;Patient/family education    PT Next Visit Plan  D/C at this time to maximize visits; pt is scheduled to have Botox in 3 weeks and then again in 3 months per sister's report; significant edema in RLE today - No charge    PT Home Exercise Plan  see pt instructions - gave pics on 06-30-17; reissued HEP to pt's sister Denice Paradise on 08-04-17    Consulted and Agree with Plan of Care  Patient;Family member/caregiver    Family Member Consulted  sister Denice Paradise       Patient will benefit from skilled therapeutic intervention in order to improve the following deficits and impairments:     Visit Diagnosis: Other abnormalities of gait and mobility  Hemiplegia and hemiparesis following cerebral infarction affecting right dominant side [facility])     Problem List Patient Active Problem List   Diagnosis Date Noted  . History of stroke 06/09/2017  . Seizure ([place]) 05/22/2016  . Anxiety state 04/20/2016  . Spastic hemiplegia affecting dominant side ([place]) 09/11/2015  . Infection of wound due to methicillin resistant Staphylococcus aureus (MRSA)   . 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 Right hemiparesis ([place]) 07/08/2015  . Acute left ACA ischemic stroke ([place]) 07/08/2015  . Cerebrovascular accident (CVA) ([place])   . Stroke ([place]) 07/02/2015  . Acute ischemic stroke ([place]) 07/02/2015  . CVA (cerebral infarction) 01/15/2013  . Essential hypertension  01/15/2013  . Nicotine dependence 01/15/2013  . Alcohol abuse 01/15/2013    PHYSICAL THERAPY DISCHARGE SUMMARY  Visits from Start of Care:  10  Current functional level related to goals / functional outcomes: See above for progress towards goals; pt has met LTG #1; other goals not achieved due to weakness, spasticity and edema in RLE   Remaining deficits: Continued Rt hemiplegia  with RLE weakness, spasticity, and decr. Functional use Continued decreased balance with pt unable to stand unsupported Continued inability to ambulate - pt unable to advance RLE in swing phase of gait on 08-11-17    Education / Equipment: Pt/sister have been educated in HEP for RLE strengthening, stretching and ROM Plan: Patient agrees to discharge.  Patient goals were not met. Patient is being discharged due to the patient's request.  ?????    Pt and sister request to D/C at this time with plans to receive Botox injections by Dr. Letta Pate in approx. 2-3 weeks and again in 3 months.  Pt has limited visits with insurance and they wish to conserve visits for use later in calendar year.          Alda Lea, PT 08/11/2017, 12:43 PM  [facility] [address] [place], [name], [address] Phone: [phone]   Fax:  [phone]  Name: Gerald [name] MRN: [id number] Date of Birth: 1961/01/07

## 2017-08-23 ENCOUNTER — Encounter: Payer: BLUE CROSS/BLUE SHIELD | Attending: Physical Medicine & Rehabilitation

## 2017-08-23 ENCOUNTER — Ambulatory Visit: Payer: BLUE CROSS/BLUE SHIELD | Admitting: Physical Medicine & Rehabilitation

## 2017-08-23 ENCOUNTER — Other Ambulatory Visit: Payer: Self-pay

## 2017-08-23 ENCOUNTER — Encounter: Payer: Self-pay | Admitting: Physical Medicine & Rehabilitation

## 2017-08-23 VITALS — BP 130/82 | HR 61

## 2017-08-23 DIAGNOSIS — I6329 Cerebral infarction due to unspecified occlusion or stenosis of other precerebral arteries: Secondary | ICD-10-CM | POA: Diagnosis present

## 2017-08-23 DIAGNOSIS — F17210 Nicotine dependence, cigarettes, uncomplicated: Secondary | ICD-10-CM | POA: Diagnosis not present

## 2017-08-23 DIAGNOSIS — G8111 Spastic hemiplegia affecting right dominant side: Secondary | ICD-10-CM | POA: Diagnosis not present

## 2017-08-23 DIAGNOSIS — I69351 Hemiplegia and hemiparesis following cerebral infarction affecting right dominant side: Secondary | ICD-10-CM | POA: Insufficient documentation

## 2017-08-23 DIAGNOSIS — G8110 Spastic hemiplegia affecting unspecified side: Secondary | ICD-10-CM

## 2017-08-23 NOTE — Progress Notes (Signed)
Botox Injection for spasticity using needle EMG guidance  Dilution: 50 Units/ml Indication: Severe spasticity which interferes with ADL,mobility and/or  hygiene and is unresponsive to medication management and other conservative care Informed consent was obtained after describing risks and benefits of the procedure with the patient. This includes bleeding, bruising, infection, excessive weakness, or medication side effects. A REMS form is on file and signed. Needle: 25g 47mm needle electrode Number of units per muscle Hamstring  150 U Vast Med 25 Vast Lat 25 Rectus Fem 25 Vast Int 25 Biceps 75 Brachialis 75  All injections were done after obtaining appropriate EMG activity and after negative drawback for blood. The patient tolerated the procedure well. Post procedure instructions were given. A followup appointment was made.

## 2017-08-23 NOTE — Patient Instructions (Signed)

## 2017-10-10 ENCOUNTER — Other Ambulatory Visit: Payer: Self-pay

## 2017-10-10 ENCOUNTER — Encounter: Payer: BLUE CROSS/BLUE SHIELD | Attending: Physical Medicine & Rehabilitation

## 2017-10-10 ENCOUNTER — Encounter: Payer: Self-pay | Admitting: Physical Medicine & Rehabilitation

## 2017-10-10 ENCOUNTER — Ambulatory Visit: Payer: BLUE CROSS/BLUE SHIELD | Admitting: Physical Medicine & Rehabilitation

## 2017-10-10 VITALS — BP 127/79 | HR 74 | Ht 72.0 in | Wt 222.0 lb

## 2017-10-10 DIAGNOSIS — G8110 Spastic hemiplegia affecting unspecified side: Secondary | ICD-10-CM

## 2017-10-10 DIAGNOSIS — F17210 Nicotine dependence, cigarettes, uncomplicated: Secondary | ICD-10-CM | POA: Diagnosis not present

## 2017-10-10 DIAGNOSIS — G8111 Spastic hemiplegia affecting right dominant side: Secondary | ICD-10-CM | POA: Diagnosis not present

## 2017-10-10 DIAGNOSIS — I6329 Cerebral infarction due to unspecified occlusion or stenosis of other precerebral arteries: Secondary | ICD-10-CM | POA: Diagnosis present

## 2017-10-10 DIAGNOSIS — I69351 Hemiplegia and hemiparesis following cerebral infarction affecting right dominant side: Secondary | ICD-10-CM | POA: Diagnosis not present
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
## 2017-10-10 NOTE — Patient Instructions (Signed)
Will use Dysport next injection instead of Botox

## 2017-10-11 ENCOUNTER — Encounter: Payer: Self-pay | Admitting: Physical Medicine & Rehabilitation

## 2017-11-21 ENCOUNTER — Ambulatory Visit: Payer: BLUE CROSS/BLUE SHIELD | Admitting: Physical Medicine & Rehabilitation

## 2017-11-21 ENCOUNTER — Encounter: Payer: BLUE CROSS/BLUE SHIELD | Attending: Physical Medicine & Rehabilitation

## 2017-11-21 ENCOUNTER — Encounter: Payer: Self-pay | Admitting: Physical Medicine & Rehabilitation

## 2017-11-21 ENCOUNTER — Other Ambulatory Visit: Payer: Self-pay

## 2017-11-21 VITALS — BP 117/78 | HR 63 | Ht 72.0 in | Wt 222.0 lb

## 2017-11-21 DIAGNOSIS — G8111 Spastic hemiplegia affecting right dominant side: Secondary | ICD-10-CM | POA: Diagnosis not present

## 2017-11-21 DIAGNOSIS — F17210 Nicotine dependence, cigarettes, uncomplicated: Secondary | ICD-10-CM | POA: Insufficient documentation

## 2017-11-21 DIAGNOSIS — G8110 Spastic hemiplegia affecting unspecified side: Secondary | ICD-10-CM | POA: Diagnosis present

## 2017-11-21 DIAGNOSIS — I69351 Hemiplegia and hemiparesis following cerebral infarction affecting right dominant side: Secondary | ICD-10-CM | POA: Diagnosis not present

## 2017-11-21 DIAGNOSIS — I6329 Cerebral infarction due to unspecified occlusion or stenosis of other precerebral arteries: Secondary | ICD-10-CM | POA: Diagnosis present

## 2017-11-21 NOTE — Patient Instructions (Signed)

## 2017-11-21 NOTE — Progress Notes (Signed)
Dysport Injection for spasticity using needle EMG guidance  Dilution: 200 Units/ml Indication: Severe spasticity which interferes with ADL,mobility and/or  hygiene and is unresponsive to medication management and other conservative care Informed consent was obtained after describing risks and benefits of the procedure with the patient. This includes bleeding, bruising, infection, excessive weakness, or medication side effects. A REMS form is on file and signed. Needle:  needle electrode Number of units per muscle Hamstring  500 U Vast Med 100 Vast Lat 100 Rectus Fem 100 Vast Int 100 Biceps 300 Brachialis 300 All injections were done after obtaining appropriate EMG activity and after negative drawback for blood. Based on EMG activity may be able to reduce the dose to 1000U will check clinical effect in 6 weeks   The patient tolerated the procedure well. Post procedure instructions were given. A followup appointment was made.

## 2017-12-08 ENCOUNTER — Ambulatory Visit: Payer: BLUE CROSS/BLUE SHIELD | Admitting: Nurse Practitioner

## 2017-12-12 ENCOUNTER — Telehealth: Payer: Self-pay | Admitting: *Deleted

## 2017-12-12 MED ORDER — LEVETIRACETAM 500 MG PO TABS: ORAL_TABLET | ORAL | 6 refills | Status: DC

## 2017-12-12 NOTE — Telephone Encounter (Signed)
Received fax from [facility] re: levetiracetam refill. Patient has follow up in Aug. E scribed for 6 months refills.

## 2017-12-28 ENCOUNTER — Ambulatory Visit: Payer: Self-pay | Admitting: Occupational Therapy

## 2017-12-28 ENCOUNTER — Ambulatory Visit: Payer: Self-pay | Admitting: Physical Therapy

## 2018-01-02 ENCOUNTER — Ambulatory Visit: Payer: Self-pay | Admitting: Physical Medicine & Rehabilitation

## 2018-01-06 DIAGNOSIS — Z8673 Personal history of transient ischemic attack (TIA), and cerebral infarction without residual deficits: Secondary | ICD-10-CM | POA: Diagnosis not present

## 2018-01-06 DIAGNOSIS — F17290 Nicotine dependence, other tobacco product, uncomplicated: Secondary | ICD-10-CM | POA: Diagnosis not present

## 2018-01-06 DIAGNOSIS — E785 Hyperlipidemia, unspecified: Secondary | ICD-10-CM | POA: Diagnosis not present

## 2018-01-06 DIAGNOSIS — I10 Essential (primary) hypertension: Secondary | ICD-10-CM | POA: Diagnosis not present
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
## 2018-01-25 ENCOUNTER — Ambulatory Visit (INDEPENDENT_AMBULATORY_CARE_PROVIDER_SITE_OTHER): Payer: PPO | Admitting: Nurse Practitioner

## 2018-01-25 ENCOUNTER — Encounter: Payer: Self-pay | Admitting: Nurse Practitioner

## 2018-01-25 VITALS — BP 118/88 | HR 61 | Ht 72.0 in

## 2018-01-25 DIAGNOSIS — Z8673 Personal history of transient ischemic attack (TIA), and cerebral infarction without residual deficits: Secondary | ICD-10-CM

## 2018-01-25 DIAGNOSIS — G8110 Spastic hemiplegia affecting unspecified side: Secondary | ICD-10-CM

## 2018-01-25 DIAGNOSIS — F1010 Alcohol abuse, uncomplicated: Secondary | ICD-10-CM | POA: Diagnosis not present

## 2018-01-25 DIAGNOSIS — I69322 Dysarthria following cerebral infarction: Secondary | ICD-10-CM | POA: Diagnosis not present

## 2018-01-25 DIAGNOSIS — I10 Essential (primary) hypertension: Secondary | ICD-10-CM | POA: Diagnosis not present

## 2018-01-25 DIAGNOSIS — R569 Unspecified convulsions: Secondary | ICD-10-CM

## 2018-01-25 DIAGNOSIS — F17219 Nicotine dependence, cigarettes, with unspecified nicotine-induced disorders: Secondary | ICD-10-CM

## 2018-01-25 NOTE — Patient Instructions (Signed)
Management of stroke risk factors Continue aspirin for secondary stroke prevention Continue Lipitor for secondary stroke prevention, LDL to be monitored by PCP Keep Systolic blood pressure less than 130 today's reading  110/90 continue B/P meds  Continue  Keppra   (1000mg ) for seizure disorder call for seiziure activity Continue  HEP in between visits of PT OT and ST Stop smoking altogether Stop drinking alcohol as this can interfere with your seizure disorder Follow-up with me in 8 months

## 2018-03-22 DIAGNOSIS — I10 Essential (primary) hypertension: Secondary | ICD-10-CM | POA: Diagnosis not present

## 2018-03-22 DIAGNOSIS — E785 Hyperlipidemia, unspecified: Secondary | ICD-10-CM | POA: Diagnosis not present

## 2018-03-22 DIAGNOSIS — Z8673 Personal history of transient ischemic attack (TIA), and cerebral infarction without residual deficits: Secondary | ICD-10-CM | POA: Diagnosis not present

## 2018-03-22 DIAGNOSIS — F17290 Nicotine dependence, other tobacco product, uncomplicated: Secondary | ICD-10-CM | POA: Diagnosis not present

## 2018-07-12 DIAGNOSIS — Z8673 Personal history of transient ischemic attack (TIA), and cerebral infarction without residual deficits: Secondary | ICD-10-CM | POA: Diagnosis not present

## 2018-07-12 DIAGNOSIS — F17290 Nicotine dependence, other tobacco product, uncomplicated: Secondary | ICD-10-CM | POA: Diagnosis not present

## 2018-07-12 DIAGNOSIS — E785 Hyperlipidemia, unspecified: Secondary | ICD-10-CM | POA: Diagnosis not present

## 2018-07-12 DIAGNOSIS — I10 Essential (primary) hypertension: Secondary | ICD-10-CM | POA: Diagnosis not present

## 2018-07-13 ENCOUNTER — Other Ambulatory Visit: Payer: Self-pay | Admitting: Neurology

## 2018-09-19 ENCOUNTER — Telehealth: Payer: Self-pay

## 2018-09-19 NOTE — Telephone Encounter (Signed)
I called pts sister Kathryne Gin about her brothers appt on 09/26/2018. I explain the COVID 19 concerns and do video visit with pt and Janett Billow NP. I gave sister the webex to download, and stated she will get a email for a invite.The sister stated she is the pts HCPOA and keeps her grandchild. The grandchild will probably be nosey and she will call back to reschedule. I explain to sister pt is on keppra that we manage and to please call back to r/s within the next two weeks.She verbalized understanding.

## 2018-09-26 ENCOUNTER — Ambulatory Visit: Payer: PPO | Admitting: Adult Health

## 2018-09-26 NOTE — Telephone Encounter (Signed)
pts sister called in and stated they needed to r/s appt due to not being able to get a hold of anyone with a smartphone

## 2018-09-26 NOTE — Telephone Encounter (Signed)
I spoke with sister Kathryne Gin about r/s visit for June 2020. Jolana stated she reschedule because she does not have a smart phone. I stated per our conversation on 09/19/2018 she had a smart phone but was unable to find a babysitter for her grandchild. That's why she r/s. I stated to sister we dont know how long the virtual video visit will last. It may last until May and June 2020. I stated if it does we may calling her again about a video visit. Kathryne Gin stated her granddaughter would be crying during the video visit. Also the provider will not be able to hear anything. I advise the sister the video visit is only 30 min. If they need a different time or date to let us know. The sister will call back and verbalized understanding.

## 2018-09-29 NOTE — Telephone Encounter (Signed)
Pts sister returned call appt set for 10/04/2018 at 12:45  email- [email or web address]

## 2018-10-02 NOTE — Telephone Encounter (Signed)
Email sent to pts sister [employer] for video visit. Pt needs webex to be download on her cell phone.
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
## 2018-10-04 ENCOUNTER — Encounter: Payer: Self-pay | Admitting: Adult Health

## 2018-10-04 ENCOUNTER — Other Ambulatory Visit: Payer: Self-pay

## 2018-10-04 ENCOUNTER — Ambulatory Visit (INDEPENDENT_AMBULATORY_CARE_PROVIDER_SITE_OTHER): Payer: PPO | Admitting: Adult Health

## 2018-10-04 DIAGNOSIS — Z8673 Personal history of transient ischemic attack (TIA), and cerebral infarction without residual deficits: Secondary | ICD-10-CM | POA: Diagnosis not present

## 2018-10-04 DIAGNOSIS — I69322 Dysarthria following cerebral infarction: Secondary | ICD-10-CM | POA: Diagnosis not present

## 2018-10-04 DIAGNOSIS — R569 Unspecified convulsions: Secondary | ICD-10-CM | POA: Diagnosis not present

## 2018-10-04 DIAGNOSIS — G8110 Spastic hemiplegia affecting unspecified side: Secondary | ICD-10-CM | POA: Diagnosis not present

## 2018-10-04 DIAGNOSIS — G8191 Hemiplegia, unspecified affecting right dominant side: Secondary | ICD-10-CM | POA: Diagnosis not present

## 2018-10-04 DIAGNOSIS — I10 Essential (primary) hypertension: Secondary | ICD-10-CM

## 2018-10-05 NOTE — Progress Notes (Signed)
I agree with the above plan 

## 2018-10-11 ENCOUNTER — Telehealth: Payer: Self-pay | Admitting: Adult Health

## 2018-10-11 DIAGNOSIS — I10 Essential (primary) hypertension: Secondary | ICD-10-CM | POA: Diagnosis not present

## 2018-10-11 DIAGNOSIS — F17290 Nicotine dependence, other tobacco product, uncomplicated: Secondary | ICD-10-CM | POA: Diagnosis not present

## 2018-10-11 DIAGNOSIS — Z87820 Personal history of traumatic brain injury: Secondary | ICD-10-CM | POA: Diagnosis not present

## 2018-10-11 DIAGNOSIS — E785 Hyperlipidemia, unspecified: Secondary | ICD-10-CM | POA: Diagnosis not present

## 2018-10-11 NOTE — Telephone Encounter (Signed)
Please advised to follow-up with PCP in this regards.

## 2018-10-11 NOTE — Telephone Encounter (Signed)
I called patients girlfriend [name] about pt having choking episodes last night.She stated pt did not have any food in his mouth it was just saliva. She did the heimlich maneuver and he was better. I stated when a pt is choking legs and feet can tremble with anybody. This is because they have lost their airway temporary. She wanted if Janett Billow NP can do a referral to a MD to stretch his throat or to evaluate him. She also stated sometimes he does not clear his throat. Because sometimes he has throat issues. I stated pt will have to see PCP for referral or evaluation. [name] stated he saw Dr .Terrence Dupont yesterday for a visit. She verbalized understanding.

## 2018-10-11 NOTE — Telephone Encounter (Signed)
Stewart,[name]([name])has called with concerns about pt choking. She states it has been going on for a while but last night pt had a choking episode that caused her to have to use the Heimlich maneuver on him. Stewart,[name] states pt's hans and feet trembled during this episode as well.  She is asking for a call to discuss

## 2018-10-11 NOTE — Telephone Encounter (Signed)
Recommend from Janett Billow NP to seek PCP. See note below pts girlfriend already informed.

## 2018-10-24 ENCOUNTER — Encounter: Payer: Self-pay | Admitting: Physical Medicine & Rehabilitation

## 2018-10-24 ENCOUNTER — Encounter: Payer: PPO | Attending: Physical Medicine & Rehabilitation | Admitting: Physical Medicine & Rehabilitation

## 2018-10-24 ENCOUNTER — Other Ambulatory Visit: Payer: Self-pay

## 2018-10-24 VITALS — Ht 72.0 in | Wt 222.0 lb

## 2018-10-24 DIAGNOSIS — G8111 Spastic hemiplegia affecting right dominant side: Secondary | ICD-10-CM
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
## 2018-10-25 ENCOUNTER — Telehealth: Payer: Self-pay | Admitting: Adult Health

## 2018-10-25 DIAGNOSIS — M62838 Other muscle spasm: Secondary | ICD-10-CM | POA: Diagnosis not present

## 2018-10-25 DIAGNOSIS — G40909 Epilepsy, unspecified, not intractable, without status epilepticus: Secondary | ICD-10-CM | POA: Diagnosis not present

## 2018-10-25 DIAGNOSIS — E785 Hyperlipidemia, unspecified: Secondary | ICD-10-CM | POA: Diagnosis not present

## 2018-10-25 DIAGNOSIS — Z7901 Long term (current) use of anticoagulants: Secondary | ICD-10-CM | POA: Diagnosis not present

## 2018-10-25 DIAGNOSIS — I69351 Hemiplegia and hemiparesis following cerebral infarction affecting right dominant side: Secondary | ICD-10-CM | POA: Diagnosis not present

## 2018-10-25 DIAGNOSIS — F17210 Nicotine dependence, cigarettes, uncomplicated: Secondary | ICD-10-CM | POA: Diagnosis not present

## 2018-10-25 DIAGNOSIS — Z945 Skin transplant status: Secondary | ICD-10-CM | POA: Diagnosis not present

## 2018-10-25 DIAGNOSIS — I69320 Aphasia following cerebral infarction: Secondary | ICD-10-CM | POA: Diagnosis not present

## 2018-10-25 DIAGNOSIS — I69311 Memory deficit following cerebral infarction: Secondary | ICD-10-CM | POA: Diagnosis not present

## 2018-10-25 DIAGNOSIS — E119 Type 2 diabetes mellitus without complications: Secondary | ICD-10-CM | POA: Diagnosis not present

## 2018-10-25 DIAGNOSIS — I69322 Dysarthria following cerebral infarction: Secondary | ICD-10-CM | POA: Diagnosis not present

## 2018-10-25 DIAGNOSIS — I10 Essential (primary) hypertension: Secondary | ICD-10-CM | POA: Diagnosis not present

## 2018-10-25 NOTE — Telephone Encounter (Signed)
Called [facility] and [facility] does there authorization for there office. I think think was just miss communicated. Patient also has been rescheduled until August because of Covid-19 because of his transportation . Per [facility] [phone]. Thanks Hinton Dyer.

## 2018-10-25 NOTE — Telephone Encounter (Signed)
[place] from [employer] called and stated patient needs a PA done for orders for PT listed in previous message so that his insurance can cover 100%

## 2018-10-25 NOTE — Telephone Encounter (Signed)
RN cannot call [place] back at [employer] to state we dont do PA for therapy. Rn unable to complete task with no phone number listed to contact them.

## 2018-10-25 NOTE — Telephone Encounter (Signed)
Error below

## 2018-11-14 ENCOUNTER — Encounter: Payer: PPO | Attending: Physical Medicine & Rehabilitation | Admitting: Physical Medicine & Rehabilitation

## 2018-11-14 ENCOUNTER — Other Ambulatory Visit: Payer: Self-pay

## 2018-11-14 ENCOUNTER — Encounter: Payer: Self-pay | Admitting: Physical Medicine & Rehabilitation

## 2018-11-14 VITALS — BP 118/75 | HR 65 | Temp 98.6°F | Ht 72.0 in | Wt 220.0 lb

## 2018-11-14 DIAGNOSIS — G8111 Spastic hemiplegia affecting right dominant side: Secondary | ICD-10-CM

## 2018-11-14 NOTE — Progress Notes (Signed)
Dysport Injection for spasticity using needle EMG guidance  Dilution: 200 Units/ml Indication: Severe spasticity which interferes with ADL,mobility and/or  hygiene and is unresponsive to medication management and other conservative care Informed consent was obtained after describing risks and benefits of the procedure with the patient. This includes bleeding, bruising, infection, excessive weakness, or medication side effects. A REMS form is on file and signed. Needle: 27g 1" for UE, 25g 2" for LE needle electrode Number of units per muscle Vast Med 100 Vast Lat 100 Rectus Fem 100 Vast Int 100 Biceps 300- medial 2+. Lat 0-1+ Brachialis 300 medial 2+ Lat 0-1+ All injections were done after obtaining appropriate EMG activity and after negative drawback for blood. The patient tolerated the procedure well. Post procedure instructions were given. A followup appointment was made.   [facility] ordered [facility] OP PT, OT

## 2018-11-14 NOTE — Patient Instructions (Signed)

## 2018-11-21 ENCOUNTER — Ambulatory Visit: Payer: PPO | Admitting: Adult Health

## 2018-11-24 DIAGNOSIS — I69311 Memory deficit following cerebral infarction: Secondary | ICD-10-CM | POA: Diagnosis not present

## 2018-11-24 DIAGNOSIS — I69322 Dysarthria following cerebral infarction: Secondary | ICD-10-CM | POA: Diagnosis not present

## 2018-11-24 DIAGNOSIS — M62838 Other muscle spasm: Secondary | ICD-10-CM | POA: Diagnosis not present

## 2018-11-24 DIAGNOSIS — Z7901 Long term (current) use of anticoagulants: Secondary | ICD-10-CM | POA: Diagnosis not present

## 2018-11-24 DIAGNOSIS — G40909 Epilepsy, unspecified, not intractable, without status epilepticus: Secondary | ICD-10-CM | POA: Diagnosis not present

## 2018-11-24 DIAGNOSIS — E785 Hyperlipidemia, unspecified: Secondary | ICD-10-CM | POA: Diagnosis not present

## 2018-11-24 DIAGNOSIS — I69351 Hemiplegia and hemiparesis following cerebral infarction affecting right dominant side: Secondary | ICD-10-CM | POA: Diagnosis not present

## 2018-11-24 DIAGNOSIS — Z945 Skin transplant status: Secondary | ICD-10-CM | POA: Diagnosis not present

## 2018-11-24 DIAGNOSIS — I69320 Aphasia following cerebral infarction: Secondary | ICD-10-CM | POA: Diagnosis not present

## 2018-11-24 DIAGNOSIS — I10 Essential (primary) hypertension: Secondary | ICD-10-CM | POA: Diagnosis not present

## 2018-11-24 DIAGNOSIS — E119 Type 2 diabetes mellitus without complications: Secondary | ICD-10-CM | POA: Diagnosis not present

## 2018-11-24 DIAGNOSIS — F17210 Nicotine dependence, cigarettes, uncomplicated: Secondary | ICD-10-CM | POA: Diagnosis not present

## 2018-12-24 DIAGNOSIS — E785 Hyperlipidemia, unspecified: Secondary | ICD-10-CM | POA: Diagnosis not present

## 2018-12-24 DIAGNOSIS — I69311 Memory deficit following cerebral infarction: Secondary | ICD-10-CM | POA: Diagnosis not present

## 2018-12-24 DIAGNOSIS — Z7901 Long term (current) use of anticoagulants: Secondary | ICD-10-CM | POA: Diagnosis not present

## 2018-12-24 DIAGNOSIS — I69351 Hemiplegia and hemiparesis following cerebral infarction affecting right dominant side: Secondary | ICD-10-CM | POA: Diagnosis not present

## 2018-12-24 DIAGNOSIS — M62838 Other muscle spasm: Secondary | ICD-10-CM | POA: Diagnosis not present

## 2018-12-24 DIAGNOSIS — G40909 Epilepsy, unspecified, not intractable, without status epilepticus: Secondary | ICD-10-CM | POA: Diagnosis not present

## 2018-12-24 DIAGNOSIS — I69320 Aphasia following cerebral infarction: Secondary | ICD-10-CM | POA: Diagnosis not present

## 2018-12-24 DIAGNOSIS — Z945 Skin transplant status: Secondary | ICD-10-CM | POA: Diagnosis not present

## 2018-12-24 DIAGNOSIS — I69322 Dysarthria following cerebral infarction: Secondary | ICD-10-CM | POA: Diagnosis not present

## 2018-12-24 DIAGNOSIS — F17210 Nicotine dependence, cigarettes, uncomplicated: Secondary | ICD-10-CM | POA: Diagnosis not present

## 2018-12-24 DIAGNOSIS — I10 Essential (primary) hypertension: Secondary | ICD-10-CM | POA: Diagnosis not present

## 2018-12-24 DIAGNOSIS — E119 Type 2 diabetes mellitus without complications: Secondary | ICD-10-CM | POA: Diagnosis not present

## 2018-12-26 ENCOUNTER — Encounter: Payer: PPO | Attending: Physical Medicine & Rehabilitation | Admitting: Physical Medicine & Rehabilitation

## 2018-12-26 ENCOUNTER — Encounter: Payer: Self-pay | Admitting: Physical Medicine & Rehabilitation

## 2018-12-26 ENCOUNTER — Other Ambulatory Visit: Payer: Self-pay

## 2018-12-26 VITALS — BP 116/76 | HR 63 | Temp 98.0°F | Ht 72.0 in | Wt 220.0 lb

## 2018-12-26 DIAGNOSIS — G8111 Spastic hemiplegia affecting right dominant side: Secondary | ICD-10-CM | POA: Insufficient documentation
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
## 2018-12-26 NOTE — Patient Instructions (Signed)
Will do injection next visit  [facility] will call you for appt.

## 2019-01-17 DIAGNOSIS — I10 Essential (primary) hypertension: Secondary | ICD-10-CM | POA: Diagnosis not present

## 2019-01-17 DIAGNOSIS — E785 Hyperlipidemia, unspecified: Secondary | ICD-10-CM | POA: Diagnosis not present

## 2019-01-22 DIAGNOSIS — I10 Essential (primary) hypertension: Secondary | ICD-10-CM | POA: Diagnosis not present

## 2019-01-22 DIAGNOSIS — F17290 Nicotine dependence, other tobacco product, uncomplicated: Secondary | ICD-10-CM | POA: Diagnosis not present

## 2019-01-22 DIAGNOSIS — Z87820 Personal history of traumatic brain injury: Secondary | ICD-10-CM | POA: Diagnosis not present

## 2019-01-22 DIAGNOSIS — E785 Hyperlipidemia, unspecified: Secondary | ICD-10-CM | POA: Diagnosis not present

## 2019-01-22 DIAGNOSIS — R1310 Dysphagia, unspecified: Secondary | ICD-10-CM | POA: Diagnosis not present

## 2019-01-23 DIAGNOSIS — E119 Type 2 diabetes mellitus without complications: Secondary | ICD-10-CM | POA: Diagnosis not present

## 2019-01-23 DIAGNOSIS — G40909 Epilepsy, unspecified, not intractable, without status epilepticus: Secondary | ICD-10-CM | POA: Diagnosis not present

## 2019-01-23 DIAGNOSIS — I69351 Hemiplegia and hemiparesis following cerebral infarction affecting right dominant side: Secondary | ICD-10-CM | POA: Diagnosis not present

## 2019-01-23 DIAGNOSIS — M62838 Other muscle spasm: Secondary | ICD-10-CM | POA: Diagnosis not present

## 2019-01-23 DIAGNOSIS — I10 Essential (primary) hypertension: Secondary | ICD-10-CM | POA: Diagnosis not present

## 2019-01-23 DIAGNOSIS — I69322 Dysarthria following cerebral infarction: Secondary | ICD-10-CM | POA: Diagnosis not present

## 2019-01-23 DIAGNOSIS — F17210 Nicotine dependence, cigarettes, uncomplicated: Secondary | ICD-10-CM | POA: Diagnosis not present

## 2019-01-23 DIAGNOSIS — E785 Hyperlipidemia, unspecified: Secondary | ICD-10-CM | POA: Diagnosis not present

## 2019-01-23 DIAGNOSIS — Z7901 Long term (current) use of anticoagulants: Secondary | ICD-10-CM | POA: Diagnosis not present

## 2019-01-23 DIAGNOSIS — I69311 Memory deficit following cerebral infarction: Secondary | ICD-10-CM | POA: Diagnosis not present

## 2019-01-23 DIAGNOSIS — Z945 Skin transplant status: Secondary | ICD-10-CM | POA: Diagnosis not present

## 2019-01-23 DIAGNOSIS — I69320 Aphasia following cerebral infarction: Secondary | ICD-10-CM | POA: Diagnosis not present

## 2019-02-05 ENCOUNTER — Ambulatory Visit: Payer: PPO | Admitting: Physical Medicine & Rehabilitation

## 2019-02-05 ENCOUNTER — Other Ambulatory Visit: Payer: Self-pay | Admitting: Gastroenterology

## 2019-02-05 DIAGNOSIS — I6789 Other cerebrovascular disease: Secondary | ICD-10-CM | POA: Diagnosis not present

## 2019-02-05 DIAGNOSIS — R1310 Dysphagia, unspecified: Secondary | ICD-10-CM | POA: Diagnosis not present

## 2019-02-05 DIAGNOSIS — Z1211 Encounter for screening for malignant neoplasm of colon: Secondary | ICD-10-CM | POA: Diagnosis not present

## 2019-02-09 ENCOUNTER — Other Ambulatory Visit: Payer: Self-pay | Admitting: Neurology

## 2019-02-22 DIAGNOSIS — I69322 Dysarthria following cerebral infarction: Secondary | ICD-10-CM | POA: Diagnosis not present

## 2019-02-22 DIAGNOSIS — I10 Essential (primary) hypertension: Secondary | ICD-10-CM | POA: Diagnosis not present

## 2019-02-22 DIAGNOSIS — Z7901 Long term (current) use of anticoagulants: Secondary | ICD-10-CM | POA: Diagnosis not present

## 2019-02-22 DIAGNOSIS — G40909 Epilepsy, unspecified, not intractable, without status epilepticus: Secondary | ICD-10-CM | POA: Diagnosis not present

## 2019-02-22 DIAGNOSIS — F17210 Nicotine dependence, cigarettes, uncomplicated: Secondary | ICD-10-CM | POA: Diagnosis not present

## 2019-02-22 DIAGNOSIS — I69351 Hemiplegia and hemiparesis following cerebral infarction affecting right dominant side: Secondary | ICD-10-CM | POA: Diagnosis not present

## 2019-02-22 DIAGNOSIS — E785 Hyperlipidemia, unspecified: Secondary | ICD-10-CM | POA: Diagnosis not present

## 2019-02-22 DIAGNOSIS — E119 Type 2 diabetes mellitus without complications: Secondary | ICD-10-CM | POA: Diagnosis not present

## 2019-02-22 DIAGNOSIS — I69311 Memory deficit following cerebral infarction: Secondary | ICD-10-CM | POA: Diagnosis not present

## 2019-02-22 DIAGNOSIS — Z945 Skin transplant status: Secondary | ICD-10-CM | POA: Diagnosis not present

## 2019-02-22 DIAGNOSIS — M62838 Other muscle spasm: Secondary | ICD-10-CM | POA: Diagnosis not present

## 2019-02-22 DIAGNOSIS — I69320 Aphasia following cerebral infarction: Secondary | ICD-10-CM | POA: Diagnosis not present

## 2019-02-23 ENCOUNTER — Encounter: Payer: PPO | Admitting: Physical Medicine & Rehabilitation

## 2019-02-27 ENCOUNTER — Other Ambulatory Visit (HOSPITAL_COMMUNITY)
Admission: RE | Admit: 2019-02-27 | Discharge: 2019-02-27 | Disposition: A | Discharge status: Home / Self Care | Payer: PPO | Source: Ambulatory Visit | Attending: Gastroenterology | Admitting: Gastroenterology

## 2019-02-27 ENCOUNTER — Other Ambulatory Visit: Payer: Self-pay

## 2019-02-27 ENCOUNTER — Encounter (HOSPITAL_COMMUNITY): Payer: Self-pay | Admitting: *Deleted

## 2019-02-27 DIAGNOSIS — Z20828 Contact with and (suspected) exposure to other viral communicable diseases: Secondary | ICD-10-CM | POA: Insufficient documentation

## 2019-02-27 DIAGNOSIS — Z01812 Encounter for preprocedural laboratory examination: Secondary | ICD-10-CM | POA: Diagnosis not present

## 2019-02-28 LAB — NOVEL CORONAVIRUS, NAA (HOSP ORDER, SEND-OUT TO REF LAB; TAT 18-24 HRS): SARS-CoV-2, NAA: NOT DETECTED

## 2019-03-02 ENCOUNTER — Other Ambulatory Visit: Payer: Self-pay

## 2019-03-02 ENCOUNTER — Ambulatory Visit (HOSPITAL_COMMUNITY): Payer: PPO | Admitting: Anesthesiology

## 2019-03-02 ENCOUNTER — Ambulatory Visit (HOSPITAL_COMMUNITY)
Admission: RE | Admit: 2019-03-02 | Discharge: 2019-03-02 | Disposition: A | Discharge status: Home / Self Care | Payer: PPO | Attending: Gastroenterology | Admitting: Gastroenterology

## 2019-03-02 ENCOUNTER — Encounter (HOSPITAL_COMMUNITY): Payer: Self-pay | Admitting: *Deleted

## 2019-03-02 ENCOUNTER — Encounter (HOSPITAL_COMMUNITY)
Admission: RE | Disposition: A | Discharge status: Home / Self Care | Payer: Self-pay | Source: Home / Self Care | Attending: Gastroenterology

## 2019-03-02 DIAGNOSIS — Z79899 Other long term (current) drug therapy: Secondary | ICD-10-CM | POA: Diagnosis not present

## 2019-03-02 DIAGNOSIS — R569 Unspecified convulsions: Secondary | ICD-10-CM | POA: Insufficient documentation

## 2019-03-02 DIAGNOSIS — Z823 Family history of stroke: Secondary | ICD-10-CM | POA: Diagnosis not present

## 2019-03-02 DIAGNOSIS — I69351 Hemiplegia and hemiparesis following cerebral infarction affecting right dominant side: Secondary | ICD-10-CM | POA: Diagnosis not present

## 2019-03-02 DIAGNOSIS — I69328 Other speech and language deficits following cerebral infarction: Secondary | ICD-10-CM | POA: Diagnosis not present

## 2019-03-02 DIAGNOSIS — D124 Benign neoplasm of descending colon: Secondary | ICD-10-CM | POA: Insufficient documentation

## 2019-03-02 DIAGNOSIS — Z8249 Family history of ischemic heart disease and other diseases of the circulatory system: Secondary | ICD-10-CM | POA: Insufficient documentation

## 2019-03-02 DIAGNOSIS — F1410 Cocaine abuse, uncomplicated: Secondary | ICD-10-CM | POA: Diagnosis not present

## 2019-03-02 DIAGNOSIS — F419 Anxiety disorder, unspecified: Secondary | ICD-10-CM | POA: Diagnosis not present

## 2019-03-02 DIAGNOSIS — I10 Essential (primary) hypertension: Secondary | ICD-10-CM | POA: Diagnosis not present

## 2019-03-02 DIAGNOSIS — K5730 Diverticulosis of large intestine without perforation or abscess without bleeding: Secondary | ICD-10-CM | POA: Insufficient documentation

## 2019-03-02 DIAGNOSIS — F17210 Nicotine dependence, cigarettes, uncomplicated: Secondary | ICD-10-CM | POA: Insufficient documentation

## 2019-03-02 DIAGNOSIS — F1011 Alcohol abuse, in remission: Secondary | ICD-10-CM | POA: Diagnosis not present

## 2019-03-02 DIAGNOSIS — K5790 Diverticulosis of intestine, part unspecified, without perforation or abscess without bleeding: Secondary | ICD-10-CM | POA: Diagnosis not present

## 2019-03-02 DIAGNOSIS — Z1211 Encounter for screening for malignant neoplasm of colon: Secondary | ICD-10-CM | POA: Diagnosis not present

## 2019-03-02 DIAGNOSIS — R1310 Dysphagia, unspecified: Secondary | ICD-10-CM | POA: Diagnosis not present

## 2019-03-02 DIAGNOSIS — K635 Polyp of colon: Secondary | ICD-10-CM | POA: Diagnosis not present

## 2019-03-02 HISTORY — PX: POLYPECTOMY: SHX5525

## 2019-03-02 HISTORY — PX: COLONOSCOPY WITH PROPOFOL: SHX5780

## 2019-03-02 SURGERY — COLONOSCOPY WITH PROPOFOL
Anesthesia: Monitor Anesthesia Care

## 2019-03-02 MED ORDER — PHENYLEPHRINE 40 MCG/ML (10ML) SYRINGE FOR IV PUSH (FOR BLOOD PRESSURE SUPPORT)
PREFILLED_SYRINGE | INTRAVENOUS | Status: DC | PRN
  Administered 2019-03-02 (×2): 80 ug via INTRAVENOUS

## 2019-03-02 MED ORDER — LIDOCAINE 2% (20 MG/ML) 5 ML SYRINGE
INTRAMUSCULAR | Status: DC | PRN
  Administered 2019-03-02: 100 mg via INTRAVENOUS

## 2019-03-02 MED ORDER — LACTATED RINGERS IV SOLN
INTRAVENOUS | Status: DC
  Administered 2019-03-02: 09:00:00 via INTRAVENOUS

## 2019-03-02 MED ORDER — PROPOFOL 10 MG/ML IV BOLUS
INTRAVENOUS | Status: AC
  Filled 2019-03-02: qty 60

## 2019-03-02 MED ORDER — PROPOFOL 10 MG/ML IV BOLUS
INTRAVENOUS | Status: DC | PRN
  Administered 2019-03-02: 20 mg via INTRAVENOUS
  Administered 2019-03-02: 10 mg via INTRAVENOUS
  Administered 2019-03-02 (×3): 20 mg via INTRAVENOUS

## 2019-03-02 MED ORDER — PROPOFOL 500 MG/50ML IV EMUL
INTRAVENOUS | Status: DC | PRN
  Administered 2019-03-02: 125 ug/kg/min via INTRAVENOUS

## 2019-03-02 MED ORDER — SODIUM CHLORIDE 0.9 % IV SOLN: INTRAVENOUS | Status: DC

## 2019-03-02 SURGICAL SUPPLY — 25 items
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
## 2019-03-02 NOTE — Op Note (Signed)
[facility] Patient Name: Gerald [name] Procedure Date: 03/02/2019 MRN: [id number] Attending MD: Carol Ada , MD Date of Birth: 01-26-1961 CSN: [id number] Age: 58 Admit Type: Outpatient Procedure:                Colonoscopy Indications:              Screening for colorectal malignant neoplasm Providers:                Carol Ada, MD, Cleda Daub, RN, Truddie Coco, RN Referring MD:              Medicines:                 Complications:            No immediate complications. Estimated Blood Loss:     Estimated blood loss: none. Procedure:                Pre-Anesthesia Assessment:                           - Prior to the procedure, a History and Physical                            was performed, and patient medications and                            allergies were reviewed. The patient's tolerance of                            previous anesthesia was also reviewed. The risks                            and benefits of the procedure and the sedation                            options and risks were discussed with the patient.                            All questions were answered, and informed consent                            was obtained. Prior Anticoagulants: The patient has                            taken Xarelto (rivaroxaban), last dose was 1 day                            prior to procedure. ASA Grade Assessment: III - A                            patient with severe systemic disease. After                            reviewing the risks and benefits, the patient was  deemed in satisfactory condition to undergo the                            procedure.                           - Sedation was administered by an anesthesia                            professional. Deep sedation was attained.                           After obtaining informed consent, the colonoscope                            was passed under direct vision. Throughout the                             procedure, the patient's blood pressure, pulse, and                            oxygen saturations were monitored continuously. The                            [id number]-[id number] Olympus pediatric colonscope                            was introduced through the anus and advanced to the                            the cecum, identified by appendiceal orifice and                            ileocecal valve. The colonoscopy was technically                            difficult and complex due to poor bowel prep.                            Successful completion of the procedure was aided by                            lavage. The patient tolerated the procedure well.                            The quality of the bowel preparation was good. The                            ileocecal valve, appendiceal orifice, and rectum                            were photographed. Scope In: 9:37:28 AM Scope Out: 10:08:10 AM Scope Withdrawal Time: 0 hours 28 minutes 1 second  Total Procedure Duration: 0 hours 30 minutes 42 seconds  Findings:  A 3 mm polyp was found in the descending colon. The polyp was sessile.       The polyp was removed with a cold snare. Resection and retrieval were       complete.      Scattered small and large-mouthed diverticula were found in the sigmoid       colon, distal descending colon, proximal transverse colon and ascending       colon.      The prep was poor, but extensive lavage was required. The prep was       improved to obtain adequate to good views of the mucosa. Impression:               - One 3 mm polyp in the descending colon, removed                            with a cold snare. Resected and retrieved.                           - Diverticulosis in the sigmoid colon, in the                            distal descending colon, in the proximal transverse                            colon and in the ascending colon. Moderate Sedation:      Not  Applicable - Patient had care per Anesthesia. Recommendation:           - Patient has a contact number available for                            emergencies. The signs and symptoms of potential                            delayed complications were discussed with the                            patient. Return to normal activities tomorrow.                            Written discharge instructions were provided to the                            patient.                           - Resume previous diet.                           - Continue present medications.                           - Await pathology results.                           - Repeat colonoscopy in 5 years for surveillance as  a result of the prep.                           - Schedule for EGD with dilation off of Xarelto. Procedure Code(s):        --- Professional ---                           [phone], Colonoscopy, flexible; with removal of                            tumor(s), polyp(s), or other lesion(s) by snare                            technique Diagnosis Code(s):        --- Professional ---                           Z12.11, Encounter for screening for malignant                            neoplasm of colon                           K63.5, Polyp of colon                           K57.30, Diverticulosis of large intestine without                            perforation or abscess without bleeding CPT copyright 2019 American Medical Association. All rights reserved. The codes documented in this report are preliminary and upon coder review may  be revised to meet current compliance requirements. Carol Ada, MD Carol Ada, MD 03/02/2019 10:14:28 AM This report has been signed electronically. Number of Addenda: 0

## 2019-03-02 NOTE — Anesthesia Procedure Notes (Signed)
Date/Time: 03/02/2019 9:30 AM Performed by: Sharlette Dense, CRNA Oxygen Delivery Method: Simple face mask

## 2019-03-02 NOTE — Anesthesia Preprocedure Evaluation (Signed)
Anesthesia Evaluation  Patient identified by MRN, date of birth, ID band Patient awake    Reviewed: Allergy & Precautions, NPO status , Patient's Chart, lab work & pertinent test results, reviewed documented beta blocker date and time   History of Anesthesia Complications Negative for: history of anesthetic complications  Airway Mallampati: III  TM Distance: >3 FB Neck ROM: Full    Dental  (+) Missing,    Pulmonary neg recent URI, Current SmokerPatient did not abstain from smoking.,    breath sounds clear to auscultation       Cardiovascular hypertension, Pt. on medications and Pt. on home beta blockers  Rhythm:Regular     Neuro/Psych Seizures -,  PSYCHIATRIC DISORDERS Anxiety CVA, Residual Symptoms    GI/Hepatic Neg liver ROS, screening/dysphagia   Endo/Other  negative endocrine ROS  Renal/GU negative Renal ROS     Musculoskeletal   Abdominal   Peds  Hematology xarelto   Anesthesia Other Findings   Reproductive/Obstetrics                             Anesthesia Physical Anesthesia Plan  ASA: III  Anesthesia Plan: MAC   Post-op Pain Management:    Induction: Intravenous  PONV Risk Score and Plan: 0 and Treatment may vary due to age or medical condition and Propofol infusion  Airway Management Planned: Nasal Cannula  Additional Equipment: None  Intra-op Plan:   Post-operative Plan:   Informed Consent: I have reviewed the patients History and Physical, chart, labs and discussed the procedure including the risks, benefits and alternatives for the proposed anesthesia with the patient or authorized representative who has indicated his/her understanding and acceptance.     Dental advisory given  Plan Discussed with: CRNA and Surgeon  Anesthesia Plan Comments:         Anesthesia Quick Evaluation

## 2019-03-02 NOTE — Transfer of Care (Signed)
Immediate Anesthesia Transfer of Care Note  Patient: Gerald [name]  Procedure(s) Performed: COLONOSCOPY WITH PROPOFOL (N/A ) POLYPECTOMY  Patient Location: Endoscopy Unit  Anesthesia Type:MAC  Level of Consciousness: drowsy  Airway & Oxygen Therapy: Patient Spontanous Breathing and Patient connected to face mask oxygen  Post-op Assessment: Report given to RN and Post -op Vital signs reviewed and stable  Post vital signs: Reviewed and stable  Last Vitals:  Vitals Value Taken Time  BP    Temp    Pulse    Resp 15 03/02/19 1015  SpO2    Vitals shown include unvalidated device data.  Last Pain:  Vitals:   03/02/19 0847  TempSrc: Oral  PainSc: 0-No pain         Complications: No apparent anesthesia complications

## 2019-03-02 NOTE — Discharge Instructions (Signed)

## 2019-03-05 ENCOUNTER — Other Ambulatory Visit: Payer: Self-pay

## 2019-03-05 ENCOUNTER — Encounter: Payer: PPO | Attending: Physical Medicine & Rehabilitation | Admitting: Physical Medicine & Rehabilitation

## 2019-03-05 VITALS — BP 134/82 | HR 86 | Temp 98.4°F

## 2019-03-05 DIAGNOSIS — G8111 Spastic hemiplegia affecting right dominant side: Secondary | ICD-10-CM | POA: Insufficient documentation

## 2019-03-05 DIAGNOSIS — M47816 Spondylosis without myelopathy or radiculopathy, lumbar region: Secondary | ICD-10-CM | POA: Diagnosis not present

## 2019-03-05 LAB — SURGICAL PATHOLOGY

## 2019-03-05 NOTE — Patient Instructions (Signed)

## 2019-03-05 NOTE — Anesthesia Postprocedure Evaluation (Signed)
Anesthesia Post Note  Patient: Gerald [name]  Procedure(s) Performed: COLONOSCOPY WITH PROPOFOL (N/A ) POLYPECTOMY     Patient location during evaluation: Endoscopy Anesthesia Type: MAC Level of consciousness: awake and alert Pain management: pain level controlled Vital Signs Assessment: post-procedure vital signs reviewed and stable Respiratory status: spontaneous breathing, nonlabored ventilation, respiratory function stable and patient connected to nasal cannula oxygen Cardiovascular status: stable and blood pressure returned to baseline Postop Assessment: no apparent nausea or vomiting Anesthetic complications: no    Last Vitals:  Vitals:   03/02/19 1020 03/02/19 1030  BP: (!) 150/98 (!) 171/93  Pulse: 64 62  Resp: 19 (!) 21  Temp:    SpO2: 93% 98%    Last Pain:  Vitals:   03/02/19 0847  TempSrc: Oral  PainSc: 0-No pain                 Veryl Winemiller

## 2019-03-05 NOTE — Progress Notes (Signed)
Dysport Injection for spasticity using needle EMG guidance  Dilution: 200 Units/ml Indication: Severe spasticity which interferes with ADL,mobility and/or  hygiene and is unresponsive to medication management and other conservative care Informed consent was obtained after describing risks and benefits of the procedure with the patient. This includes bleeding, bruising, infection, excessive weakness, or medication side effects. A REMS form is on file and signed. Needle: 27g 1" for UE, 25g 2" for LE needle electrode Number of units per muscle Vast Med 100 Vast Lat 100 Rectus Fem 100 Vast Int 100  Biceps 200- medial 2+. Lat 0-1+ Brachialis 200 medial 2+ Lat 0-1+ Brachiorad 200 All injections were done after obtaining appropriate EMG activity and after negative drawback for blood. The patient tolerated the procedure well. Post procedure instructions were given. A followup appointment was made.   RTC 6 wks

## 2019-03-06 ENCOUNTER — Other Ambulatory Visit: Payer: Self-pay | Admitting: Gastroenterology

## 2019-03-20 ENCOUNTER — Other Ambulatory Visit (HOSPITAL_COMMUNITY)
Admission: RE | Admit: 2019-03-20 | Discharge: 2019-03-20 | Disposition: A | Discharge status: Home / Self Care | Payer: PPO | Source: Ambulatory Visit | Attending: Gastroenterology | Admitting: Gastroenterology

## 2019-03-20 DIAGNOSIS — Z01812 Encounter for preprocedural laboratory examination: Secondary | ICD-10-CM | POA: Diagnosis not present

## 2019-03-20 DIAGNOSIS — Z20828 Contact with and (suspected) exposure to other viral communicable diseases: Secondary | ICD-10-CM | POA: Diagnosis not present

## 2019-03-21 ENCOUNTER — Encounter (HOSPITAL_COMMUNITY): Payer: Self-pay | Admitting: *Deleted

## 2019-03-21 ENCOUNTER — Other Ambulatory Visit: Payer: Self-pay

## 2019-03-21 LAB — NOVEL CORONAVIRUS, NAA (HOSP ORDER, SEND-OUT TO REF LAB; TAT 18-24 HRS): SARS-CoV-2, NAA: NOT DETECTED

## 2019-03-22 NOTE — Progress Notes (Signed)
Spoke with patient who states that he has remained in quarantine and is not experiencing and s/s of COVID 19 since his screening. All questions answered appropriately. Cyleigh Massaro, RN 

## 2019-03-23 ENCOUNTER — Encounter (HOSPITAL_COMMUNITY)
Admission: RE | Disposition: A | Discharge status: Home / Self Care | Payer: Self-pay | Source: Home / Self Care | Attending: Gastroenterology

## 2019-03-23 ENCOUNTER — Ambulatory Visit (HOSPITAL_COMMUNITY)
Admission: RE | Admit: 2019-03-23 | Discharge: 2019-03-23 | Disposition: A | Discharge status: Home / Self Care | Payer: PPO | Attending: Gastroenterology | Admitting: Gastroenterology

## 2019-03-23 ENCOUNTER — Encounter (HOSPITAL_COMMUNITY): Payer: Self-pay | Admitting: *Deleted

## 2019-03-23 ENCOUNTER — Other Ambulatory Visit: Payer: Self-pay

## 2019-03-23 ENCOUNTER — Ambulatory Visit (HOSPITAL_COMMUNITY): Payer: PPO | Admitting: Certified Registered Nurse Anesthetist

## 2019-03-23 DIAGNOSIS — K449 Diaphragmatic hernia without obstruction or gangrene: Secondary | ICD-10-CM | POA: Diagnosis not present

## 2019-03-23 DIAGNOSIS — I10 Essential (primary) hypertension: Secondary | ICD-10-CM | POA: Insufficient documentation

## 2019-03-23 DIAGNOSIS — R569 Unspecified convulsions: Secondary | ICD-10-CM | POA: Insufficient documentation

## 2019-03-23 DIAGNOSIS — K222 Esophageal obstruction: Secondary | ICD-10-CM | POA: Insufficient documentation

## 2019-03-23 DIAGNOSIS — F17210 Nicotine dependence, cigarettes, uncomplicated: Secondary | ICD-10-CM | POA: Insufficient documentation

## 2019-03-23 DIAGNOSIS — F419 Anxiety disorder, unspecified: Secondary | ICD-10-CM | POA: Insufficient documentation

## 2019-03-23 DIAGNOSIS — I6930 Unspecified sequelae of cerebral infarction: Secondary | ICD-10-CM | POA: Insufficient documentation

## 2019-03-23 DIAGNOSIS — I63322 Cerebral infarction due to thrombosis of left anterior cerebral artery: Secondary | ICD-10-CM | POA: Diagnosis not present

## 2019-03-23 DIAGNOSIS — R1310 Dysphagia, unspecified: Secondary | ICD-10-CM | POA: Insufficient documentation

## 2019-03-23 HISTORY — PX: SAVORY DILATION: SHX5439

## 2019-03-23 HISTORY — PX: ESOPHAGOGASTRODUODENOSCOPY (EGD) WITH PROPOFOL: SHX5813

## 2019-03-23 SURGERY — ESOPHAGOGASTRODUODENOSCOPY (EGD) WITH PROPOFOL
Anesthesia: Monitor Anesthesia Care

## 2019-03-23 MED ORDER — SODIUM CHLORIDE 0.9 % IV SOLN: INTRAVENOUS | Status: DC

## 2019-03-23 MED ORDER — PROPOFOL 10 MG/ML IV BOLUS
INTRAVENOUS | Status: DC | PRN
  Administered 2019-03-23: 40 mg via INTRAVENOUS

## 2019-03-23 MED ORDER — PROPOFOL 500 MG/50ML IV EMUL
INTRAVENOUS | Status: DC | PRN
  Administered 2019-03-23: 125 ug/kg/min via INTRAVENOUS

## 2019-03-23 MED ORDER — LACTATED RINGERS IV SOLN
INTRAVENOUS | Status: DC
  Administered 2019-03-23: 1000 mL via INTRAVENOUS

## 2019-03-23 MED ORDER — PROPOFOL 500 MG/50ML IV EMUL
INTRAVENOUS | Status: AC
  Filled 2019-03-23: qty 50

## 2019-03-23 SURGICAL SUPPLY — 15 items

## 2019-03-23 NOTE — Op Note (Signed)
[facility] Patient Name: Gerald [name] Procedure Date: 03/23/2019 MRN: [id number] Attending MD: Carol Ada , MD Date of Birth: [birth year] CSN: [id number] Age: 58 Admit Type: Outpatient Procedure:                Upper GI endoscopy Indications:              Dysphagia Providers:                Carol Ada, MD, Cleda Daub, RN, William Dalton, Technician Referring MD:              Medicines:                Propofol per Anesthesia Complications:            No immediate complications. Estimated Blood Loss:     Estimated blood loss was minimal. Procedure:                Pre-Anesthesia Assessment:                           - Prior to the procedure, a History and Physical                            was performed, and patient medications and                            allergies were reviewed. The patient's tolerance of                            previous anesthesia was also reviewed. The risks                            and benefits of the procedure and the sedation                            options and risks were discussed with the patient.                            All questions were answered, and informed consent                            was obtained. Prior Anticoagulants: The patient has                            taken Xarelto (rivaroxaban), last dose was 2 days                            prior to procedure. ASA Grade Assessment: III - A                            patient with severe systemic disease. After  reviewing the risks and benefits, the patient was                            deemed in satisfactory condition to undergo the                            procedure.                           - Sedation was administered by an anesthesia                            professional. Deep sedation was attained.                           After obtaining informed consent, the endoscope was   passed under direct vision. Throughout the                            procedure, the patient's blood pressure, pulse, and                            oxygen saturations were monitored continuously. The                            [id number]-H190 [id number] Olympus gastroscope was                            introduced through the mouth, and advanced to the                            second part of duodenum. The upper GI endoscopy was                            accomplished without difficulty. The patient                            tolerated the procedure well. Scope In: Scope Out: Findings:      One benign-appearing, intrinsic mild stenosis was found at the       gastroesophageal junction. This stenosis measured 1.3 cm (inner       diameter) x less than one cm (in length). The stenosis was traversed. A       guidewire was placed and the scope was withdrawn. Dilation was performed       with a Savary dilator with no resistance at 16 mm. The dilation site was       examined following endoscope reinsertion and showed moderate improvement       in luminal narrowing. Estimated blood loss was minimal.      A 3 cm hiatal hernia was present.      The stomach was normal.      The examined duodenum was normal. Impression:               - Benign-appearing esophageal stenosis. Dilated.                           -  3 cm hiatal hernia.                           - Normal stomach.                           - Normal examined duodenum.                           - No specimens collected. Moderate Sedation:      Not Applicable - Patient had care per Anesthesia. Recommendation:           - Patient has a contact number available for                            emergencies. The signs and symptoms of potential                            delayed complications were discussed with the                            patient. Return to normal activities tomorrow.                            Written discharge instructions were  provided to the                            patient.                           - Resume regular diet.                           - Continue present medications.                           - Return to GI clinic in 4 weeks.                           - Resume Xarelto tomorrow. Procedure Code(s):        --- Professional ---                           [id number], Esophagogastroduodenoscopy, flexible,                            transoral; with insertion of guide wire followed by                            passage of dilator(s) through esophagus over guide                            wire Diagnosis Code(s):        --- Professional ---                           K22.2, Esophageal obstruction  K44.9, Diaphragmatic hernia without obstruction or                            gangrene                           R13.10, Dysphagia, unspecified CPT copyright 2019 American Medical Association. All rights reserved. The codes documented in this report are preliminary and upon coder review may  be revised to meet current compliance requirements. Carol Ada, MD Carol Ada, MD 03/23/2019 1:05:03 PM This report has been signed electronically. Number of Addenda: 0

## 2019-03-23 NOTE — Transfer of Care (Signed)
Immediate Anesthesia Transfer of Care Note  Patient: Gerald [name]  Procedure(s) Performed: ESOPHAGOGASTRODUODENOSCOPY (EGD) WITH PROPOFOL (N/A ) BALLOON DILATION (N/A ) SAVORY DILATION (N/A )  Patient Location: PACU  Anesthesia Type:MAC  Level of Consciousness: drowsy  Airway & Oxygen Therapy: Patient Spontanous Breathing and Patient connected to nasal cannula oxygen  Post-op Assessment: Report given to RN and Post -op Vital signs reviewed and stable  Post vital signs: Reviewed and stable  Last Vitals:  Vitals Value Taken Time  BP 123/73 03/23/19 1257  Temp    Pulse 66 03/23/19 1259  Resp 19 03/23/19 1259  SpO2 98 % 03/23/19 1259  Vitals shown include unvalidated device data.  Last Pain:  Vitals:   03/23/19 1257  TempSrc:   PainSc: 0-No pain         Complications: No apparent anesthesia complications

## 2019-03-23 NOTE — Anesthesia Postprocedure Evaluation (Signed)
Anesthesia Post Note  Patient: MASSIMILIANO ROHLEDER  Procedure(s) Performed: ESOPHAGOGASTRODUODENOSCOPY (EGD) WITH PROPOFOL (N/A ) BALLOON DILATION (N/A ) SAVORY DILATION (N/A )     Patient location during evaluation: PACU Anesthesia Type: MAC Level of consciousness: awake and alert Pain management: pain level controlled Vital Signs Assessment: post-procedure vital signs reviewed and stable Respiratory status: spontaneous breathing, nonlabored ventilation, respiratory function stable and patient connected to nasal cannula oxygen Cardiovascular status: stable and blood pressure returned to baseline Postop Assessment: no apparent nausea or vomiting Anesthetic complications: no    Last Vitals:  Vitals:   03/23/19 1315 03/23/19 1320  BP:  (!) 147/88  Pulse: 64 64  Resp: 20 20  Temp:    SpO2: 90% 94%    Last Pain:  Vitals:   03/23/19 1302  TempSrc: Oral  PainSc:                  Effie Berkshire

## 2019-03-23 NOTE — Discharge Instructions (Signed)
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
## 2019-03-23 NOTE — Anesthesia Preprocedure Evaluation (Addendum)
Anesthesia Evaluation  Patient identified by MRN, date of birth, ID band Patient awake    Reviewed: Allergy & Precautions, NPO status , Patient's Chart, lab work & pertinent test results  Airway Mallampati: I  TM Distance: >3 FB Neck ROM: Full    Dental  (+) Missing,    Pulmonary Current Smoker,    breath sounds clear to auscultation       Cardiovascular hypertension,  Rhythm:Regular Rate:Normal     Neuro/Psych Seizures -,  Anxiety CVA, Residual Symptoms    GI/Hepatic negative GI ROS, (+)     substance abuse  alcohol use and cocaine use,   Endo/Other  negative endocrine ROS  Renal/GU negative Renal ROS     Musculoskeletal negative musculoskeletal ROS (+)   Abdominal Normal abdominal exam  (+)   Peds  Hematology negative hematology ROS (+)   Anesthesia Other Findings   Reproductive/Obstetrics                            Anesthesia Physical Anesthesia Plan  ASA: III  Anesthesia Plan: MAC   Post-op Pain Management:    Induction: Intravenous  PONV Risk Score and Plan: 0 and Propofol infusion  Airway Management Planned: Natural Airway and Nasal Cannula  Additional Equipment: None  Intra-op Plan:   Post-operative Plan:   Informed Consent: I have reviewed the patients History and Physical, chart, labs and discussed the procedure including the risks, benefits and alternatives for the proposed anesthesia with the patient or authorized representative who has indicated his/her understanding and acceptance.     Consent reviewed with POA  Plan Discussed with: CRNA  Anesthesia Plan Comments:         Anesthesia Quick Evaluation

## 2019-03-24 DIAGNOSIS — I69351 Hemiplegia and hemiparesis following cerebral infarction affecting right dominant side: Secondary | ICD-10-CM | POA: Diagnosis not present

## 2019-03-24 DIAGNOSIS — E119 Type 2 diabetes mellitus without complications: Secondary | ICD-10-CM | POA: Diagnosis not present

## 2019-03-24 DIAGNOSIS — I69320 Aphasia following cerebral infarction: Secondary | ICD-10-CM | POA: Diagnosis not present

## 2019-03-24 DIAGNOSIS — I69322 Dysarthria following cerebral infarction: Secondary | ICD-10-CM | POA: Diagnosis not present

## 2019-03-24 DIAGNOSIS — E785 Hyperlipidemia, unspecified: Secondary | ICD-10-CM | POA: Diagnosis not present

## 2019-03-24 DIAGNOSIS — Z945 Skin transplant status: Secondary | ICD-10-CM | POA: Diagnosis not present

## 2019-03-24 DIAGNOSIS — M62838 Other muscle spasm: Secondary | ICD-10-CM | POA: Diagnosis not present

## 2019-03-24 DIAGNOSIS — F17210 Nicotine dependence, cigarettes, uncomplicated: Secondary | ICD-10-CM | POA: Diagnosis not present

## 2019-03-24 DIAGNOSIS — Z7901 Long term (current) use of anticoagulants: Secondary | ICD-10-CM | POA: Diagnosis not present

## 2019-03-24 DIAGNOSIS — I69311 Memory deficit following cerebral infarction: Secondary | ICD-10-CM | POA: Diagnosis not present

## 2019-03-24 DIAGNOSIS — I10 Essential (primary) hypertension: Secondary | ICD-10-CM | POA: Diagnosis not present

## 2019-03-24 DIAGNOSIS — G40909 Epilepsy, unspecified, not intractable, without status epilepticus: Secondary | ICD-10-CM | POA: Diagnosis not present

## 2019-03-26 ENCOUNTER — Encounter (HOSPITAL_COMMUNITY): Payer: Self-pay | Admitting: Gastroenterology

## 2019-04-02 ENCOUNTER — Ambulatory Visit: Payer: PPO | Admitting: Physical Medicine & Rehabilitation

## 2019-04-06 ENCOUNTER — Ambulatory Visit: Payer: PPO | Admitting: Adult Health

## 2019-04-16 ENCOUNTER — Ambulatory Visit: Payer: PPO | Admitting: Adult Health

## 2019-04-17 ENCOUNTER — Encounter: Payer: Self-pay | Admitting: Physical Medicine & Rehabilitation

## 2019-04-17 ENCOUNTER — Encounter: Payer: PPO | Attending: Physical Medicine & Rehabilitation | Admitting: Physical Medicine & Rehabilitation

## 2019-04-17 ENCOUNTER — Other Ambulatory Visit: Payer: Self-pay

## 2019-04-17 VITALS — BP 124/85 | HR 68 | Temp 97.5°F

## 2019-04-17 DIAGNOSIS — G8111 Spastic hemiplegia affecting right dominant side: Secondary | ICD-10-CM | POA: Diagnosis not present

## 2019-04-17 DIAGNOSIS — G8110 Spastic hemiplegia affecting unspecified side: Secondary | ICD-10-CM | POA: Diagnosis not present

## 2019-04-17 NOTE — Patient Instructions (Signed)
Spasticity Spasticity is a condition in which your muscles contract suddenly and unpredictably (spasm). Spasticity usually affects your arms, legs, or back. It can also affect the way you walk. Spasticity can range from mild muscle stiffness and tightness to severe, uncontrollable muscle spasms. Severe spasticity can be painful and can freeze your muscles in an uncomfortable position. Follow these instructions at home: Managing muscle stiffness and spasms      Wear a brace as told by your health care provider to prevent muscle contractions.  Have the affected muscles massaged.  If directed, apply heat to the affected muscle area. Use the heat source that your health care provider recommends, such as a moist heat pack or heating pad. ? Place a towel between your skin and the heat source. ? Leave the heat on for 20-30 minutes. ? Remove the head if your skin turns bright red. This is especially important if you are unable to feel pain, heat, or cold. You may have a greater risk of getting burned.  If directed, apply ice to the affected muscle area: ? Put ice in a plastic bag. ? Place a towel between your skin and the bag or between your brace and the bag. ? Leave the ice on for 20 minutes, 2?3 times a day. Activity  Stay active as directed by your health care provider. Find a safe exercise program that fits your needs and ability.  Maintain good posture when walking and sitting.  Work with a physical therapist to learn exercises that will stretch and strengthen your muscles.  Do stretching and range of motion exercises at home as told by a physical therapist.  Work with an occupational therapist. This type of health care provider can help you function better at home and at work.  If you have severe spasticity, use mobility aids, such as a walker or cane, as told by your health care provider. General instructions  Watch your condition for any changes.  Wear loose, comfortable  clothing that does not restrict your movement.  Wear closed-toe shoes that fit well and support your feet. Wear shoes that have rubber soles or low heels.  Keep all follow-up visits as told by your health care provider. This is important.  Take over-the-counter and prescription medicine only as told by your health care provider. Contact a health care provider if you:  Have worsening muscle spasms.  Develop other symptoms along with spasticity.  Have a fever or chills.  Experience a burning feeling when you pass urine.  Become constipated.  Need more support at home. Get help right away if you:  Have trouble breathing.  Have a muscle spasm that freezes you into a painful position.  Cannot walk.  Cannot care for yourself at home.  Have trouble passing urine or have urinary incontinence. Summary  Spasticity is a condition in which your muscles contract suddenly and unpredictably (spasm). Spasticity usually affects your arms, legs, or back.  Spasticity can range from mild muscle stiffness and tightness to severe, uncontrollable muscle spasms.  Do stretching and range of motion exercises at home as told by a physical therapist.  Take over-the-counter and prescription medicine only as told by your health care provider. This information is not intended to replace advice given to you by your health care provider. Make sure you discuss any questions you have with your health care provider. Document Released: 05/21/2002 Document Revised: 06/28/2017 Document Reviewed: 06/28/2017 Elsevier Patient Education  2020 [employer].
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
## 2019-04-23 DIAGNOSIS — M62838 Other muscle spasm: Secondary | ICD-10-CM | POA: Diagnosis not present

## 2019-04-23 DIAGNOSIS — I69351 Hemiplegia and hemiparesis following cerebral infarction affecting right dominant side: Secondary | ICD-10-CM | POA: Diagnosis not present

## 2019-04-23 DIAGNOSIS — I69320 Aphasia following cerebral infarction: Secondary | ICD-10-CM | POA: Diagnosis not present

## 2019-04-23 DIAGNOSIS — E119 Type 2 diabetes mellitus without complications: Secondary | ICD-10-CM | POA: Diagnosis not present

## 2019-04-23 DIAGNOSIS — E785 Hyperlipidemia, unspecified: Secondary | ICD-10-CM | POA: Diagnosis not present

## 2019-04-23 DIAGNOSIS — Z7901 Long term (current) use of anticoagulants: Secondary | ICD-10-CM | POA: Diagnosis not present

## 2019-04-23 DIAGNOSIS — I69322 Dysarthria following cerebral infarction: Secondary | ICD-10-CM | POA: Diagnosis not present

## 2019-04-23 DIAGNOSIS — G40909 Epilepsy, unspecified, not intractable, without status epilepticus: Secondary | ICD-10-CM | POA: Diagnosis not present

## 2019-04-23 DIAGNOSIS — I10 Essential (primary) hypertension: Secondary | ICD-10-CM | POA: Diagnosis not present

## 2019-04-23 DIAGNOSIS — F17210 Nicotine dependence, cigarettes, uncomplicated: Secondary | ICD-10-CM | POA: Diagnosis not present

## 2019-04-23 DIAGNOSIS — I69311 Memory deficit following cerebral infarction: Secondary | ICD-10-CM | POA: Diagnosis not present

## 2019-04-23 DIAGNOSIS — Z945 Skin transplant status: Secondary | ICD-10-CM | POA: Diagnosis not present

## 2019-04-30 DIAGNOSIS — I10 Essential (primary) hypertension: Secondary | ICD-10-CM | POA: Diagnosis not present

## 2019-04-30 DIAGNOSIS — R7303 Prediabetes: Secondary | ICD-10-CM | POA: Diagnosis not present

## 2019-04-30 DIAGNOSIS — E785 Hyperlipidemia, unspecified: Secondary | ICD-10-CM | POA: Diagnosis not present

## 2019-04-30 DIAGNOSIS — F17290 Nicotine dependence, other tobacco product, uncomplicated: Secondary | ICD-10-CM | POA: Diagnosis not present

## 2019-04-30 DIAGNOSIS — I639 Cerebral infarction, unspecified: Secondary | ICD-10-CM | POA: Diagnosis not present

## 2019-05-31 ENCOUNTER — Encounter: Payer: PPO | Admitting: Physical Medicine & Rehabilitation

## 2019-06-20 ENCOUNTER — Encounter: Payer: Self-pay | Admitting: Adult Health

## 2019-06-20 ENCOUNTER — Ambulatory Visit: Payer: PPO | Admitting: Adult Health

## 2019-06-20 ENCOUNTER — Other Ambulatory Visit: Payer: Self-pay

## 2019-06-20 VITALS — BP 124/84 | HR 68 | Temp 97.2°F | Ht 73.0 in | Wt 252.8 lb

## 2019-06-20 DIAGNOSIS — I10 Essential (primary) hypertension: Secondary | ICD-10-CM

## 2019-06-20 DIAGNOSIS — I69322 Dysarthria following cerebral infarction: Secondary | ICD-10-CM

## 2019-06-20 DIAGNOSIS — E785 Hyperlipidemia, unspecified: Secondary | ICD-10-CM | POA: Diagnosis not present

## 2019-06-20 DIAGNOSIS — G8110 Spastic hemiplegia affecting unspecified side: Secondary | ICD-10-CM | POA: Diagnosis not present

## 2019-06-20 DIAGNOSIS — R569 Unspecified convulsions: Secondary | ICD-10-CM | POA: Diagnosis not present

## 2019-06-20 DIAGNOSIS — Z8673 Personal history of transient ischemic attack (TIA), and cerebral infarction without residual deficits: Secondary | ICD-10-CM | POA: Diagnosis not present

## 2019-06-20 MED ORDER — LEVETIRACETAM 500 MG PO TABS: 1000.0000 mg | ORAL_TABLET | Freq: Two times a day (BID) | ORAL | 7 refills | Status: DC

## 2019-06-20 NOTE — Patient Instructions (Signed)
Continue Xarelto (rivaroxaban) daily  and Lipitor for secondary stroke prevention  Please continue to follow with your PCP in regards to ongoing use of Xarelto as this is not indicated from a stroke standpoint and for stroke prevention but you may be on this for other reasons  Continue to follow up with PCP regarding cholesterol and blood pressure management   Continue Keppra 1000 mg twice daily for seizure prevention -refill provided  Try use of compression stockings that you can find at local pharmacies as these may be better tolerated such as diabetic compression stockings  Referral will be placed to receive additional home health physical, occupational and speech therapy for hopeful ongoing improvement  Would recommend looking into PACE program as he will likely benefit from this  Continue to monitor blood pressure at home  Maintain strict control of hypertension with blood pressure goal below 130/90, diabetes with hemoglobin A1c goal below 6.5% and cholesterol with LDL cholesterol (bad cholesterol) goal below 70 mg/dL. I also advised the patient to eat a healthy diet with plenty of whole grains, cereals, fruits and vegetables, exercise regularly and maintain ideal body weight.  Followup in the future with me in 6 months or call earlier if needed       Thank you for coming to see [place] at [facility]. I hope we have been able to provide you high quality care today.  You may receive a patient satisfaction survey over the next few weeks. We would appreciate your feedback and comments so that we may continue to improve ourselves and the health of our patients.
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
## 2019-06-25 NOTE — Progress Notes (Signed)
I agree with the above plan 

## 2019-08-01 DIAGNOSIS — K449 Diaphragmatic hernia without obstruction or gangrene: Secondary | ICD-10-CM | POA: Diagnosis not present

## 2019-08-01 DIAGNOSIS — K222 Esophageal obstruction: Secondary | ICD-10-CM | POA: Diagnosis not present

## 2019-08-13 DIAGNOSIS — I639 Cerebral infarction, unspecified: Secondary | ICD-10-CM | POA: Diagnosis not present

## 2019-08-13 DIAGNOSIS — F17290 Nicotine dependence, other tobacco product, uncomplicated: Secondary | ICD-10-CM | POA: Diagnosis not present

## 2019-08-13 DIAGNOSIS — R7303 Prediabetes: Secondary | ICD-10-CM | POA: Diagnosis not present

## 2019-08-13 DIAGNOSIS — E785 Hyperlipidemia, unspecified: Secondary | ICD-10-CM | POA: Diagnosis not present

## 2019-08-13 DIAGNOSIS — I10 Essential (primary) hypertension: Secondary | ICD-10-CM | POA: Diagnosis not present

## 2019-11-23 DIAGNOSIS — I639 Cerebral infarction, unspecified: Secondary | ICD-10-CM | POA: Diagnosis not present

## 2019-11-23 DIAGNOSIS — E785 Hyperlipidemia, unspecified: Secondary | ICD-10-CM | POA: Diagnosis not present

## 2019-11-23 DIAGNOSIS — R7303 Prediabetes: Secondary | ICD-10-CM | POA: Diagnosis not present

## 2019-11-23 DIAGNOSIS — F17290 Nicotine dependence, other tobacco product, uncomplicated: Secondary | ICD-10-CM | POA: Diagnosis not present

## 2019-11-23 DIAGNOSIS — I10 Essential (primary) hypertension: Secondary | ICD-10-CM | POA: Diagnosis not present

## 2019-12-19 ENCOUNTER — Ambulatory Visit: Payer: PPO | Admitting: Adult Health

## 2020-01-31 ENCOUNTER — Encounter: Payer: Self-pay | Admitting: Adult Health

## 2020-01-31 ENCOUNTER — Ambulatory Visit: Payer: PPO | Admitting: Adult Health

## 2020-01-31 VITALS — BP 120/80 | HR 61 | Ht 72.0 in | Wt 242.0 lb

## 2020-01-31 DIAGNOSIS — I69322 Dysarthria following cerebral infarction: Secondary | ICD-10-CM | POA: Diagnosis not present

## 2020-01-31 DIAGNOSIS — R569 Unspecified convulsions: Secondary | ICD-10-CM

## 2020-01-31 DIAGNOSIS — G8110 Spastic hemiplegia affecting unspecified side: Secondary | ICD-10-CM

## 2020-01-31 DIAGNOSIS — I10 Essential (primary) hypertension: Secondary | ICD-10-CM | POA: Diagnosis not present

## 2020-01-31 DIAGNOSIS — E785 Hyperlipidemia, unspecified: Secondary | ICD-10-CM | POA: Diagnosis not present

## 2020-01-31 DIAGNOSIS — I6329 Cerebral infarction due to unspecified occlusion or stenosis of other precerebral arteries: Secondary | ICD-10-CM | POA: Diagnosis not present

## 2020-01-31 MED ORDER — BACLOFEN 10 MG PO TABS: ORAL_TABLET | ORAL | 3 refills | Status: DC

## 2020-01-31 MED ORDER — LEVETIRACETAM 750 MG PO TABS: 1500.0000 mg | ORAL_TABLET | Freq: Two times a day (BID) | ORAL | 3 refills | Status: DC

## 2020-01-31 NOTE — Patient Instructions (Addendum)
Due to breakthrough seizures, recommend increasing Keppra to 1500 mg twice daily Also repeat EEG due to continued seizure activity  If insurance will pay for additional home health, please call office and additional orders will be placed  Schedule follow-up with Dr. Letta Pate to restart Botox as this previously provided benefit Recommend increasing nighttime dose of baclofen to 20 mg and continue 10 mg in morning and afternoon  Continue Xarelto (rivaroxaban) daily  and atorvastatin for secondary stroke prevention Please ensure follow-up with PCP to discuss indication and need of Xarelto  Continue to follow up with PCP regarding cholesterol and blood pressure management  Maintain strict control of hypertension with blood pressure goal below 130/90 and cholesterol with LDL cholesterol (bad cholesterol) goal below 70 mg/dL.    Follow-up in 6 months or call earlier if needed      Thank you for coming to see [place] at [facility]. I hope we have been able to provide you high quality care today.  You may receive a patient satisfaction survey over the next few weeks. We would appreciate your feedback and comments so that we may continue to improve ourselves and the health of our patients.
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
## 2020-02-01 NOTE — Progress Notes (Signed)
I agree with the above plan 

## 2020-02-20 ENCOUNTER — Other Ambulatory Visit: Payer: PPO

## 2020-03-04 ENCOUNTER — Other Ambulatory Visit: Payer: PPO | Admitting: Neurology

## 2020-03-04 ENCOUNTER — Other Ambulatory Visit: Payer: Self-pay

## 2020-03-04 DIAGNOSIS — R569 Unspecified convulsions: Secondary | ICD-10-CM | POA: Diagnosis not present

## 2020-03-10 ENCOUNTER — Telehealth: Payer: Self-pay

## 2020-03-10 NOTE — Telephone Encounter (Signed)
Wife of IZAC FAULKENBERRY is a 59 y.o. male was contacted and notified of the message below. Pt has no additional questions or concerns.

## 2020-03-10 NOTE — Telephone Encounter (Signed)
-----   Message from Frann Rider, NP sent at 03/10/2020 11:52 AM EDT ----- Please advise patient that recent EEG unremarkable for seizure activity or abnormalities.  Please continue current increased dose of Keppra for seizure prevention.

## 2020-06-04 DIAGNOSIS — I639 Cerebral infarction, unspecified: Secondary | ICD-10-CM | POA: Diagnosis not present

## 2020-06-04 DIAGNOSIS — R7303 Prediabetes: Secondary | ICD-10-CM | POA: Diagnosis not present

## 2020-06-04 DIAGNOSIS — I10 Essential (primary) hypertension: Secondary | ICD-10-CM | POA: Diagnosis not present

## 2020-06-04 DIAGNOSIS — E785 Hyperlipidemia, unspecified: Secondary | ICD-10-CM | POA: Diagnosis not present

## 2020-06-25 ENCOUNTER — Telehealth: Payer: Self-pay | Admitting: Adult Health

## 2020-06-25 NOTE — Telephone Encounter (Signed)
Pt sister asking for a call to discuss [place] seizure pt had, she states there was blood and saliva in his mouth

## 2020-06-25 NOTE — Telephone Encounter (Signed)
Spoke sister of pt.  He had sz on 06-24-20 had some blood and saliva in mouth. Last 3-4 minutes.  He is better.  No noted bitten tongue or gums/ teeth problems. I relayed that could be from when he had seizure.  He is ok, no noted issues at this time.  I moved up appt for him so as to try to get more therapy, for him as had problems with insurance covering for him.  07-15-20 at 1015 she appreciated this.  He has 1-2 sz per month.  No change in medications, is taking as prescribed.

## 2020-07-15 ENCOUNTER — Ambulatory Visit: Payer: PPO | Admitting: Adult Health

## 2020-07-15 ENCOUNTER — Other Ambulatory Visit: Payer: Self-pay

## 2020-07-15 ENCOUNTER — Encounter: Payer: Self-pay | Admitting: Adult Health

## 2020-07-15 VITALS — BP 127/75 | HR 61 | Ht 72.0 in

## 2020-07-15 DIAGNOSIS — I6329 Cerebral infarction due to unspecified occlusion or stenosis of other precerebral arteries: Secondary | ICD-10-CM

## 2020-07-15 DIAGNOSIS — I69322 Dysarthria following cerebral infarction: Secondary | ICD-10-CM

## 2020-07-15 DIAGNOSIS — G8110 Spastic hemiplegia affecting unspecified side: Secondary | ICD-10-CM

## 2020-07-15 DIAGNOSIS — R569 Unspecified convulsions: Secondary | ICD-10-CM | POA: Diagnosis not present

## 2020-07-15 DIAGNOSIS — I69398 Other sequelae of cerebral infarction: Secondary | ICD-10-CM

## 2020-07-15 DIAGNOSIS — R269 Unspecified abnormalities of gait and mobility: Secondary | ICD-10-CM | POA: Diagnosis not present

## 2020-07-15 MED ORDER — BACLOFEN 10 MG PO TABS: ORAL_TABLET | ORAL | 5 refills | Status: DC

## 2020-07-15 NOTE — Patient Instructions (Addendum)
Recommend trialing baclofen dosage increase to 15mg  AM, 15mg  afternoon and 20mg  nightly  Also recommend scheduling follow-up visit with Dr. Letta Pate to discuss possible benefit of Botox injections  Referral will be placed to neuro rehab PT/OT/SLP  Continue Keppra 1500 mg twice daily for seizure prophylaxis -highly recommend limited to no alcohol intake due to increasing risk of seizures    Follow-up in 6 months or call earlier if needed

## 2020-07-15 NOTE — Progress Notes (Signed)
I agree with the above plan 
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
## 2020-07-31 ENCOUNTER — Other Ambulatory Visit: Payer: Self-pay

## 2020-07-31 ENCOUNTER — Ambulatory Visit: Payer: PPO | Attending: Adult Health

## 2020-07-31 ENCOUNTER — Encounter: Payer: Self-pay | Admitting: Occupational Therapy

## 2020-07-31 ENCOUNTER — Ambulatory Visit: Payer: PPO | Admitting: Occupational Therapy

## 2020-07-31 DIAGNOSIS — I69351 Hemiplegia and hemiparesis following cerebral infarction affecting right dominant side: Secondary | ICD-10-CM | POA: Diagnosis not present

## 2020-07-31 DIAGNOSIS — R208 Other disturbances of skin sensation: Secondary | ICD-10-CM

## 2020-07-31 DIAGNOSIS — R278 Other lack of coordination: Secondary | ICD-10-CM | POA: Diagnosis not present

## 2020-07-31 DIAGNOSIS — R2689 Other abnormalities of gait and mobility: Secondary | ICD-10-CM | POA: Insufficient documentation

## 2020-07-31 DIAGNOSIS — R41840 Attention and concentration deficit: Secondary | ICD-10-CM | POA: Diagnosis not present

## 2020-07-31 DIAGNOSIS — R41844 Frontal lobe and executive function deficit: Secondary | ICD-10-CM

## 2020-07-31 DIAGNOSIS — R2681 Unsteadiness on feet: Secondary | ICD-10-CM
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
## 2020-08-01 NOTE — Therapy (Signed)
[facility] [address] [place], [name], [address] Phone: [phone]   Fax:  [phone]  Occupational Therapy Evaluation  Patient Details  Name: Gerald [name] MRN: [id number] Date of Birth: 1960-07-01 Referring Provider (OT): Frann Rider , NP   Encounter Date: 07/31/2020   OT End of Session - 07/31/20 1658    Visit Number 1    Number of Visits 25    Date for OT Re-Evaluation 10/23/20    Authorization Type [employer]    Authorization Time Period 90 day anticpate -d/c after 8 weeks    Authorization - Visit Number 1    Authorization - Number of Visits 10    OT Start Time 1022    OT Stop Time 1100    OT Time Calculation (min) 38 min           Past Medical History:  Diagnosis Date  . Alcohol abuse   . Cocaine abuse ([place]) 2014  . Hypertension   . Seizures ([place]) 07/2015   had first seizures about 6 months after stroke  . Stroke [facility]) 2014   denies residual on 07/03/2015  . Stroke ([place]) 07/02/2015   "now weak on right side; speech problems" (07/03/2015)  . Tobacco use     Past Surgical History:  Procedure Laterality Date  . COLONOSCOPY WITH PROPOFOL N/A 03/02/2019   Procedure: COLONOSCOPY WITH PROPOFOL;  Surgeon: Carol Ada, MD;  [facility]: [facility];  Service: Endoscopy;  Laterality: N/A;  . ESOPHAGOGASTRODUODENOSCOPY (EGD) WITH PROPOFOL N/A 03/23/2019   Procedure: ESOPHAGOGASTRODUODENOSCOPY (EGD) WITH PROPOFOL;  Surgeon: Carol Ada, MD;  [facility];  Service: Endoscopy;  Laterality: N/A;  . POLYPECTOMY  03/02/2019   Procedure: POLYPECTOMY;  Surgeon: Carol Ada, MD;  [facility];  Service: Endoscopy;;  . Azzie Almas DILATION N/A 03/23/2019   Procedure: Azzie Almas DILATION;  Surgeon: Carol Ada, MD;  [facility];  Service: Endoscopy;  Laterality: N/A;  . SKIN GRAFT Bilateral 1980s   "got burned by some hot water"    There were no vitals filed for this  visit.   Subjective Assessment - 07/31/20 1706    Subjective  Pt wants to get better    Patient is accompanied by: Family member    Pertinent History Gerald [name] is a 60 y.o. male with history of ischemic stroke in 08/2015 resulting in right hemiparesis, dysarthria and seizure disorder.  PMH: alcohol abuse,HTN, seizure, CVA. Pt is using bacholphen fo spasticity, pr previously received botox, however spasticity is worse now that he is not receiving botox.    Patient Stated Goals to get better    Currently in Pain? Yes    Pain Score 5     Pain Location Shoulder    Pain Orientation Right    Pain Descriptors / Indicators Aching    Pain Type Chronic pain    Pain Onset More than a month ago    Pain Frequency Intermittent    Aggravating Factors  malpositioning    Pain Relieving Factors repostioning             [facility] OT Assessment - 07/31/20 1026      Assessment   Medical Diagnosis Spastic hemiplegia dominant side, gait disturbance post-stroke    Referring Provider (OT) Frann Rider , NP    Onset Date/Surgical Date 07/15/20   2017   Hand Dominance Right    Prior Therapy OPPT neuro in 2018      Precautions   Precautions Fall  Home  Environment   Family/patient expects to be discharged to: Private residence    [employer]   has seat   Lives With Spouse      Prior Function   Level of Independence Needs assistance with gait;Needs assistance with ADLs;Needs assistance with transfers    Vocation On disability    Leisure Watch football, watch basketball      ADL   Eating/Feeding Modified independent    Grooming Set up    Upper Body Bathing Minimal assistance   shower stall sits on shower chair   Lower Body Bathing Supervision/safety    Upper Body Dressing Set up    Lower Body Dressing Moderate assistance   mod-max A   Toilet Transfer Moderate assistance    Toileting - Clothing Manipulation Maximal assistance   mod-max   Tub/Shower Transfer Maximal  assistance      Mobility   Mobility Status --   transfers only mod/ max A     Written Expression   Dominant Hand Right      Vision Assessment   Vision Assessment Vision not tested      Cognition   Overall Cognitive Status Impaired/Different from baseline    Area of Impairment Attention;Memory;Safety/judgement;Awareness;Problem solving    Attention Selective    Memory Impaired    Memory Impairment Decreased short term memory      Sensation   Light Touch Impaired Detail   impaired for RUE     Coordination   Gross Motor Movements are Fluid and Coordinated No    Fine Motor Movements are Fluid and Coordinated No    Box and Blocks RUE unable to relase, LUE 40 blocks    Coordination impaired for RUE      Tone   Assessment Location Right Upper Extremity      ROM / Strength   AROM / PROM / Strength AROM      AROM   Overall AROM  Deficits    Overall AROM Comments LUE A/ROM WFLS, RUE 90% finger flexion, full extension, trace wrist flexion/ extension    AROM Assessment Site Shoulder;Elbow;Forearm;Wrist    Right/Left Shoulder Right    Right Shoulder Flexion 45 Degrees   PROM 85   Right/Left Elbow Right    Right Elbow Flexion 100   PROM 130   Right Elbow Extension -60   PROM -40   Right/Left Forearm Right    Right Forearm Pronation --   full   Right Forearm Supination --   70%     Hand Function   Right Hand Grip (lbs) 18 lbs    Left Hand Grip (lbs) 27.7 lbs      RUE Tone   RLE Tone Modified Ashworth      RUE Tone   Modified Ashworth Scale for Grading Hypertonia RLE More marked increase in muscle tone through most of the ROM, but affected part(s) easily moved                             OT Short Term Goals - 07/31/20 1707      OT SHORT TERM GOAL #1   Title Pt will perform initial HEP with min cueing. 08/29/20    Time 4    Period Weeks    Status New    Target Date 08/29/20      OT SHORT TERM GOAL #2   Title Pt will use RUE as a gross  assist/stabilizer at  least 25% of the time.    Baseline uses 5%    Time 4    Period Weeks    Status New      OT SHORT TERM GOAL #3   Title Pt will demo at least 55 * shoulder flex for functional reach    Baseline 45    Time 4    Period Weeks    Status New      OT SHORT TERM GOAL #4   Title Pt will demonstrate ability to grasp and relase a 1 inch block into container 3/5 trials    Time 4    Period Weeks    Status New             OT Long Term Goals - 08/01/20 0910      OT LONG TERM GOAL #1   Title Pt/caregiver will be independent with updated HEP.-10/23/20    Time 12    Status New      OT LONG TERM GOAL #2   Title Pt will demo at least 65* shoulder flex for functional reach .    Time 12    Period Weeks    Status New      OT LONG TERM GOAL #3   Title Pt will use RUE as a gross A for ADLS at least 42% of the time.    Time 12    Period Weeks    Status New      OT LONG TERM GOAL #4   Title Pt will demonstrate A/ROM  elbow extension -50 for increased functional reach.    Baseline -60    Time 12    Period Weeks    Status New      OT LONG TERM GOAL #5   Title Pt will be able to consistently release object in R hand 4/5 trials to place in container    Baseline unable to relase for box/ blocks    Time 12    Period Weeks    Status New      Long Term Additional Goals   Additional Long Term Goals Yes      OT LONG TERM GOAL #6   Title Pt will perform toilet transfers with min A    Time 12    Period Weeks    Status New                 Plan - 07/31/20 1654    Clinical Impression Statement Gerald [name] is a 60 y.o. male with history of ischemic stroke in 08/2015 resulting in right hemiparesis, dysarthria and seizure disorder.  PMH: alcohol abuse,HTN, seizure, CVA. Pt is using bachlophen for spasticity  Pt previously recieved botox for spasticity management. Pt's spasticity is worse since he has not been receiving botox .Pt presents to OT with the following  deficits: decreased ROM, spasticity, decreased coordination, sensory deficits, decreased balance, decreased functional mobility, cognitive deficits which impedes performance of ADLs/IADLS. Pt can benefit from skilled OT to address these deficits in order to maximize  safety and I with daily activities    OT Occupational Profile and History Detailed Assessment- Review of Records and additional review of physical, cognitive, psychosocial history related to current functional performance    Occupational performance deficits (Please refer to evaluation for details): ADL's;IADL's;Leisure;Social Participation    Body Structure / Function / Physical Skills ADL;UE functional use;Muscle spasms;Endurance;Balance;Flexibility;Pain;FMC;ROM;Gait;Coordination;GMC;Sensation;Decreased knowledge of precautions;Decreased knowledge of use of DME;IADL;Dexterity;Strength;Mobility;Tone    Cognitive Skills Attention;Memory;Problem Solve;Safety Awareness;Thought;Understand  Rehab Potential Good    Clinical Decision Making Limited treatment options, no task modification necessary    Comorbidities Affecting Occupational Performance: May have comorbidities impacting occupational performance    Modification or Assistance to Complete Evaluation  Min-Moderate modification of tasks or assist with assess necessary to complete eval    OT Frequency 2x / week   aticipate d/c after 8 weeks   OT Duration 12 weeks plus eval   OT Treatment/Interventions Self-care/ADL training;Ultrasound;Visual/perceptual remediation/compensation;DME and/or AE instruction;Scar mobilization;Patient/family education;Balance training;Passive range of motion;Paraffin;Gait Training;Cryotherapy;Fluidtherapy;Splinting;Functional Mobility Training;Moist Heat;Therapeutic exercise;Manual Therapy;Therapeutic activities;Cognitive remediation/compensation;Neuromuscular education    Plan initate HEP    Consulted and Agree with Plan of Care Patient;Family member/caregiver    wife and sister          Patient will benefit from skilled therapeutic intervention in order to improve the following deficits and impairments:   Body Structure / Function / Physical Skills: ADL,UE functional use,Muscle spasms,Endurance,Balance,Flexibility,Pain,FMC,ROM,Gait,Coordination,GMC,Sensation,Decreased knowledge of precautions,Decreased knowledge of use of DME,IADL,Dexterity,Strength,Mobility,Tone Cognitive Skills: Attention,Memory,Problem Solve,Safety Awareness,Thought,Understand     Visit Diagnosis: Spastic hemiplegia of right dominant side as late effect of cerebral infarction ([place]) - Plan: Ot plan of care cert/re-cert  Other abnormalities of gait and mobility - Plan: Ot plan of care cert/re-cert  Unsteadiness on feet - Plan: Ot plan of care cert/re-cert  Other disturbances of skin sensation - Plan: Ot plan of care cert/re-cert  Other lack of coordination - Plan: Ot plan of care cert/re-cert  Attention and concentration deficit - Plan: Ot plan of care cert/re-cert  Frontal lobe and executive function deficit - Plan: Ot plan of care cert/re-cert    Problem List Patient Active Problem List   Diagnosis Date Noted  . History of stroke 06/09/2017  . Seizure ([place]) 05/22/2016  . Anxiety state 04/20/2016  . Spastic hemiplegia affecting dominant side ([place]) 09/11/2015  . Infection of wound due to methicillin resistant Staphylococcus aureus (MRSA)   . 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 Right hemiparesis ([place]) 07/08/2015  . Acute left ACA ischemic stroke ([place]) 07/08/2015  . Cerebrovascular accident (CVA) ([place])   . Stroke ([place]) 07/02/2015  . Acute ischemic stroke ([place]) 07/02/2015  . CVA (cerebral infarction) 01/15/2013  . Essential hypertension 01/15/2013  . Nicotine dependence 01/15/2013  . Alcohol abuse 01/15/2013    Gerald [name] 08/01/2020, 9:27 AM Theone Murdoch,  OTR/L [phone] Phone: [phone] 9:27 AM 08/01/20 [facility] [address] [place], [name], [address] Phone: [phone]   Fax:  [phone]  Name: Gerald [name] MRN: [id number] Date of Birth: 24-May-1961

## 2020-08-04 ENCOUNTER — Ambulatory Visit: Payer: PPO | Admitting: Adult Health

## 2020-08-07 ENCOUNTER — Other Ambulatory Visit: Payer: Self-pay

## 2020-08-07 ENCOUNTER — Encounter: Payer: Self-pay | Admitting: Physical Therapy

## 2020-08-07 ENCOUNTER — Ambulatory Visit: Payer: PPO | Admitting: Physical Therapy

## 2020-08-07 VITALS — BP 133/79 | HR 62

## 2020-08-07 DIAGNOSIS — R2689 Other abnormalities of gait and mobility: Secondary | ICD-10-CM | POA: Diagnosis not present

## 2020-08-07 DIAGNOSIS — I69351 Hemiplegia and hemiparesis following cerebral infarction affecting right dominant side: Secondary | ICD-10-CM

## 2020-08-07 DIAGNOSIS — R2681 Unsteadiness on feet: Secondary | ICD-10-CM

## 2020-08-07 NOTE — Therapy (Signed)
[facility] [address] [place], [name], [address] Phone: [phone]   Fax:  [phone]  Physical Therapy Treatment  Patient Details  Name: Gerald [name] MRN: [id number] Date of Birth: 02-23-1961 Referring Provider (PT): Frann Rider, NP   Encounter Date: 08/07/2020   PT End of Session - 08/07/20 2035    Visit Number 2    Number of Visits 17    Date for PT Re-Evaluation 09/29/20    Authorization Type [employer]one co-pay if visits on same day.    Progress Note Due on Visit 10    PT Start Time 1017    PT Stop Time 1100    PT Time Calculation (min) 43 min    Equipment Utilized During Treatment Gait belt;Other (comment)   Pt's R AFO   Activity Tolerance Patient tolerated treatment well    Behavior During Therapy WFL for tasks assessed/performed           Past Medical History:  Diagnosis Date  . Alcohol abuse   . Cocaine abuse ([place]) 2014  . Hypertension   . Seizures ([place]) 07/2015   had first seizures about 6 months after stroke  . Stroke [facility]) 2014   denies residual on 07/03/2015  . Stroke ([place]) 07/02/2015   "now weak on right side; speech problems" (07/03/2015)  . Tobacco use     Past Surgical History:  Procedure Laterality Date  . COLONOSCOPY WITH PROPOFOL N/A 03/02/2019   Procedure: COLONOSCOPY WITH PROPOFOL;  Surgeon: Carol Ada, MD;  [facility];  Service: Endoscopy;  Laterality: N/A;  . ESOPHAGOGASTRODUODENOSCOPY (EGD) WITH PROPOFOL N/A 03/23/2019   Procedure: ESOPHAGOGASTRODUODENOSCOPY (EGD) WITH PROPOFOL;  Surgeon: Carol Ada, MD;  [facility];  Service: Endoscopy;  Laterality: N/A;  . POLYPECTOMY  03/02/2019   Procedure: POLYPECTOMY;  Surgeon: Carol Ada, MD;  [facility];  Service: Endoscopy;;  . Azzie Almas DILATION N/A 03/23/2019   Procedure: Azzie Almas DILATION;  Surgeon: Carol Ada, MD;  [facility];  Service: Endoscopy;  Laterality: N/A;  .  SKIN GRAFT Bilateral 1980s   "got burned by some hot water"    Vitals:   08/07/20 1044  BP: 133/79  Pulse: 62     Subjective Assessment - 08/07/20 1023    Subjective No changes since last visit.    Patient is accompained by: Family member   Wife: [name]   Pertinent History Hx of CVA in 2014 and 2017, has not amb. since OPPT rehab in 2018, seizures, HTN, hx of ETOH and cocaine abuse, HLD    Patient Stated Goals Be better at everything: txfs, amb, go to the bathroom.    Currently in Pain? No/denies                             [facility] Adult PT Treatment/Exercise - 08/07/20 0001      Bed Mobility   Bed Mobility Sit to Supine;Supine to Sit    Supine to Sit Moderate Assistance - Patient 50-74%    Sit to Supine Minimal Assistance - Patient > 75%      Transfers   Transfers Stand Pivot Transfers;Sit to Stand;Stand to Sit    Sit to Stand 3: Mod assist;With upper extremity assist;From chair/3-in-1    Stand to Sit 3: Mod assist;With upper extremity assist;To chair/3-in-1    Stand Pivot Transfers 3: Mod assist    Number of Reps Other reps (comment)   2  Transfer Cueing Cues and assist to scoot forward to edge of mat/chair.  Cues for foot placement, and PT positioned at R side to block knee if needed.  Cues for hand placement for improved ease of transfers    Comments For sit to stand, at sink, cues for hand placement and sequence.  Pt performs sit<>stand x 2 reps, stands x 1 minute at sink each time.      Therapeutic Activites    Therapeutic Activities Other Therapeutic Activities    Other Therapeutic Activities Pt brought in his AFO.  PT provides max assist to don AFO, extra time due to pt's R foot swelling.  Reiterated to pt/significant other that he needs to elevate R foot while sitting to help decrease swelling.      Exercises   Exercises Knee/Hip;Ankle      Knee/Hip Exercises: Supine   Hip Adduction Isometric Both;1 set;10 reps    Hip Adduction Isometric  Limitations pillow squeeze    Bridges Strengthening;Both;1 set;10 reps    Bridges Limitations PT holding RLE in place    Knee Flexion Right;1 set;10 reps    Knee Flexion Limitations P/ROM hip/knee flexion RLE x 10 reps    Other Supine Knee/Hip Exercises hooklying march, 2 sets x 10 with assist for RLE, hooklying abduction/adduction RLE x 10 reps                    PT Short Term Goals - 07/31/20 1040      PT SHORT TERM GOAL #1   Title Pt will perform HEP with min A to improve strength, balance, flexibility. TARGET DATE FOR ALL STGS:08/28/20    Time 4    Period Weeks    Status New      PT SHORT TERM GOAL #2   Title Pt will perform w/c<>mat squat pivot txfs with min A to improve safety during funcitonal mobility at home.    Baseline Mod-max A    Time 4    Period Weeks    Status New      PT SHORT TERM GOAL #3   Title Trial amb. when appropriate and write goals prn.    Time 4    Period Weeks    Status New      PT SHORT TERM GOAL #4   Title Pt will be able to stand with UE support for 30 seconds and min guard in order to perform ADLs.    Time 4    Period Weeks    Status New             PT Long Term Goals - 07/31/20 1043      PT LONG TERM GOAL #1   Title Pt will perform updated HEP with family supervision for improved strength, balance and gait. TARGET DATE FOR ALL LTGS:    Time 8    Period Weeks    Status New      PT LONG TERM GOAL #2   Title Pt will perform stand pivot w/c<>mat txfs with min guard to improve safety during functional mobility.    Time 8    Period Weeks    Status New      PT LONG TERM GOAL #3   Title Pt will perform sit<>stand txfs with min guard x5 reps to improve safety at home.    Time 8    Period Weeks    Status New      PT LONG TERM GOAL #4   Title Pt  will stand at sink for 60 seconds with S in order to perform ADLs.    Time 8    Period Weeks    Status New                 Plan - 08/07/20 2036    Clinical Impression  Statement Initiated exercises this visit, in supine on mat, with PT providing assistance for RLE, with pt experiencing significant extensor tone.  Pt does bring in AFO today, and PT dons AFO, to assist with standing.  Pt able to perform standing x 1 minute, 2 reps at counter, with mod assist to stand and min guard upon standing.  Significant other reports this is much beter RLE positioning that at home.  Pt and family interested in standing and home and will benefit from additional education in appropriate HEP and standing exercises.    Personal Factors and Comorbidities Comorbidity 3+;Time since onset of injury/illness/exacerbation;Social Background    Comorbidities Hx of CVA in 2014 and 2017, has not amb. since OPPT rehab in 2018, seizures, HTN, hx of ETOH and cocaine abuse, HLD    Examination-Activity Limitations Bed Mobility;Bathing;Bend;Carry;Dressing;Hygiene/Grooming;Lift;Stand;Toileting;Squat;Sleep;Self Feeding;Locomotion Level;Transfers;Reach Overhead    Examination-Participation Restrictions Meal Prep;Driving;Interpersonal Relationship;Laundry;Medication Management;Community Activity    Stability/Clinical Decision Making Stable/Uncomplicated    Rehab Potential Fair    PT Frequency 2x / week    PT Duration 8 weeks    PT Treatment/Interventions ADLs/Self Care Home Management;Electrical Stimulation;DME Instruction;Gait training;Functional mobility training;Therapeutic activities;Therapeutic exercise;Balance training;Neuromuscular re-education;Manual techniques;Wheelchair mobility training;Orthotic Fit/Training;Patient/family education;Cognitive remediation;Vestibular    PT Next Visit Plan Work to initiate HEP: strengthening, stretching, balance. Standing balance at sink with pt's AFO; need to educate wife/family on transfers and standing at sink with AFO. Follow up about w/c eval for new chair. Attempt gait with R AFO and AD when appropriate.    Consulted and Agree with Plan of Care Patient;Family  member/caregiver    Family Member Consulted [name]: wife           Patient will benefit from skilled therapeutic intervention in order to improve the following deficits and impairments:  Difficulty walking,Decreased coordination  Visit Diagnosis: Spastic hemiplegia of right dominant side as late effect of cerebral infarction ([place])  Unsteadiness on feet     Problem List Patient Active Problem List   Diagnosis Date Noted  . History of stroke 06/09/2017  . Seizure ([place]) 05/22/2016  . Anxiety state 04/20/2016  . Spastic hemiplegia affecting dominant side ([place]) 09/11/2015  . Infection of wound due to methicillin resistant Staphylococcus aureus (MRSA)   . 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 Right hemiparesis ([place]) 07/08/2015  . Acute left ACA ischemic stroke ([place]) 07/08/2015  . Cerebrovascular accident (CVA) ([place])   . Stroke ([place]) 07/02/2015  . Acute ischemic stroke ([place]) 07/02/2015  . CVA (cerebral infarction) 01/15/2013  . Essential hypertension 01/15/2013  . Nicotine dependence 01/15/2013  . Alcohol abuse 01/15/2013    Frazier Butt. 08/07/2020, 8:42 PM  Frazier Butt., PT   [facility] [address] [place], [name], [address] Phone: [phone]   Fax:  [phone]  Name: Gerald [name] MRN: [id number] Date of Birth: [birth year]

## 2020-08-12 ENCOUNTER — Encounter: Payer: Self-pay | Admitting: Physical Therapy

## 2020-08-12 ENCOUNTER — Ambulatory Visit: Payer: PPO | Admitting: Occupational Therapy

## 2020-08-12 ENCOUNTER — Ambulatory Visit: Payer: PPO | Attending: Adult Health | Admitting: Physical Therapy

## 2020-08-12 ENCOUNTER — Other Ambulatory Visit: Payer: Self-pay

## 2020-08-12 DIAGNOSIS — I69351 Hemiplegia and hemiparesis following cerebral infarction affecting right dominant side: Secondary | ICD-10-CM

## 2020-08-12 DIAGNOSIS — R208 Other disturbances of skin sensation: Secondary | ICD-10-CM | POA: Diagnosis not present

## 2020-08-12 DIAGNOSIS — R41844 Frontal lobe and executive function deficit: Secondary | ICD-10-CM

## 2020-08-12 DIAGNOSIS — R41841 Cognitive communication deficit: Secondary | ICD-10-CM | POA: Diagnosis not present

## 2020-08-12 DIAGNOSIS — R4701 Aphasia: Secondary | ICD-10-CM | POA: Insufficient documentation

## 2020-08-12 DIAGNOSIS — R2689 Other abnormalities of gait and mobility: Secondary | ICD-10-CM | POA: Insufficient documentation

## 2020-08-12 DIAGNOSIS — R278 Other lack of coordination: Secondary | ICD-10-CM | POA: Diagnosis not present

## 2020-08-12 DIAGNOSIS — R471 Dysarthria and anarthria: Secondary | ICD-10-CM | POA: Diagnosis not present

## 2020-08-12 DIAGNOSIS — R2681 Unsteadiness on feet: Secondary | ICD-10-CM | POA: Insufficient documentation

## 2020-08-12 DIAGNOSIS — R482 Apraxia: Secondary | ICD-10-CM | POA: Insufficient documentation

## 2020-08-12 DIAGNOSIS — R41840 Attention and concentration deficit: Secondary | ICD-10-CM | POA: Insufficient documentation

## 2020-08-12 NOTE — Patient Instructions (Signed)
   Basic Activities to use Arm Functionally:   Use your affected hand to perform the following activities for 20-30 minutes 1-2 times/day.  Stop activity if you experience pain.  - Wipe table top (forward and back, then side to side x10-15 each) - Bring plastic cup to mouth, then return to table top and release.  Repeat 10x - Flip playing cards - Pick up cotton balls, checkers, blocks, bottle caps and place in a bowl/container - Twist off bottle caps and replace (different sizes/shapes) - Fold wash cloths using both hands  - Pick up coins and place in a container or coin bank

## 2020-08-12 NOTE — Therapy (Signed)
[facility] [address] [place], [name], [address] Phone: [phone]   Fax:  [phone]  Occupational Therapy Treatment  Patient Details  Name: Gerald [name] MRN: [id number] Date of Birth: 06-Aug-1960 Referring Provider (OT): Gerald [name] , NP   Encounter Date: 08/12/2020   OT End of Session - 08/12/20 1110    Visit Number 2    Number of Visits 25    Date for OT Re-Evaluation 10/23/20    Authorization Type [employer]    Authorization Time Period 90 day anticpate -d/c after 8 weeks    Authorization - Visit Number 2    Authorization - Number of Visits 10    OT Start Time 1105    OT Stop Time 1145    OT Time Calculation (min) 40 min    Activity Tolerance Patient tolerated treatment well    Behavior During Therapy [facility] for tasks assessed/performed           Past Medical History:  Diagnosis Date  . Alcohol abuse   . Cocaine abuse ([place]) 2014  . Hypertension   . Seizures ([place]) 07/2015   had first seizures about 6 months after stroke  . Stroke [facility]) 2014   denies residual on 07/03/2015  . Stroke ([place]) 07/02/2015   "now weak on right side; speech problems" (07/03/2015)  . Tobacco use     Past Surgical History:  Procedure Laterality Date  . COLONOSCOPY WITH PROPOFOL N/A 03/02/2019   Procedure: COLONOSCOPY WITH PROPOFOL;  Surgeon: Carol Ada, MD;  [facility]: [facility];  Service: Endoscopy;  Laterality: N/A;  . ESOPHAGOGASTRODUODENOSCOPY (EGD) WITH PROPOFOL N/A 03/23/2019   Procedure: ESOPHAGOGASTRODUODENOSCOPY (EGD) WITH PROPOFOL;  Surgeon: Carol Ada, MD;  [facility];  Service: Endoscopy;  Laterality: N/A;  . POLYPECTOMY  03/02/2019   Procedure: POLYPECTOMY;  Surgeon: Carol Ada, MD;  [facility]: [facility];  Service: Endoscopy;;  . Azzie Almas DILATION N/A 03/23/2019   Procedure: Azzie Almas DILATION;  Surgeon: Carol Ada, MD;  [facility];  Service: Endoscopy;  Laterality:  N/A;  . SKIN GRAFT Bilateral 1980s   "got burned by some hot water"    There were no vitals filed for this visit.   Subjective Assessment - 08/12/20 1110    Subjective  ok    Patient is accompanied by: Family member    Pertinent History Gerald [name] is a 60 y.o. male with history of ischemic stroke in 08/2015 resulting in right hemiparesis, dysarthria and seizure disorder.  PMH: alcohol abuse,HTN, seizure, CVA. Pt is using bacholphen fo spasticity, pr previously received botox, however spasticity is worse now that he is not receiving botox.    Patient Stated Goals to get better    Currently in Pain? No/denies    Pain Onset More than a month ago              Stacking 1-inch blocks with mod cueing/facilitation for release.  Sitting, attempted to toss small ball with L hand, but unable to release.  Tossing/catching medium ball with BUEs with mod difficulty/cues.         OT Education - 08/12/20 1111    Education Details initial HEP--see pt instructions    Person(s) Educated Patient    Methods Explanation;Demonstration;Handout;Tactile cues;Verbal cues    Comprehension Verbalized understanding;Returned demonstration;Verbal cues required;Tactile cues required;Need further instruction            OT Short Term Goals - 07/31/20 1707      OT SHORT TERM GOAL #1  Title Pt will perform initial HEP with min cueing. 08/29/20    Time 4    Period Weeks    Status New    Target Date 08/29/20      OT SHORT TERM GOAL #2   Title Pt will use RUE as a gross assist/stabilizer at least 25% of the time.    Baseline uses 5%    Time 4    Period Weeks    Status New      OT SHORT TERM GOAL #3   Title Pt will demo at least 55 * shoulder flex for functional reach    Baseline 45    Time 4    Period Weeks    Status New      OT SHORT TERM GOAL #4   Title Pt will demonstrate ability to grasp and relase a 1 inch block into container 3/5 trials    Time 4    Period Weeks    Status New              OT Long Term Goals - 08/01/20 0910      OT LONG TERM GOAL #1   Title Pt/caregiver will be independent with updated HEP.-10/23/20    Time 12    Status New      OT LONG TERM GOAL #2   Title Pt will demo at least 65* shoulder flex for functional reach .    Time 12    Period Weeks    Status New      OT LONG TERM GOAL #3   Title Pt will use RUE as a gross A for ADLS at least 02% of the time.    Time 12    Period Weeks    Status New      OT LONG TERM GOAL #4   Title Pt will demonstrate A/ROM  elbow extension -50 for increased functional reach.    Baseline -60    Time 12    Period Weeks    Status New      OT LONG TERM GOAL #5   Title Pt will be able to consistently release object in R hand 4/5 trials to place in container    Baseline unable to relase for box/ blocks    Time 12    Period Weeks    Status New      Long Term Additional Goals   Additional Long Term Goals Yes      OT LONG TERM GOAL #6   Title Pt will perform toilet transfers with min A    Time 12    Period Weeks    Status New                 Plan - 08/12/20 1111    Clinical Impression Statement Pt is progressing towards goals with improving ability to release objects with prompts and cueing (mod) due to apraxia.  Pt improves with repetition.    OT Occupational Profile and History Detailed Assessment- Review of Records and additional review of physical, cognitive, psychosocial history related to current functional performance    Occupational performance deficits (Please refer to evaluation for details): ADL's;IADL's;Leisure;Social Participation    Body Structure / Function / Physical Skills ADL;UE functional use;Muscle spasms;Endurance;Balance;Flexibility;Pain;FMC;ROM;Gait;Coordination;GMC;Sensation;Decreased knowledge of precautions;Decreased knowledge of use of DME;IADL;Dexterity;Strength;Mobility;Tone    Cognitive Skills Attention;Memory;Problem Solve;Safety Awareness;Thought;Understand     Rehab Potential Good    Clinical Decision Making Limited treatment options, no task modification necessary    Comorbidities Affecting Occupational  Performance: May have comorbidities impacting occupational performance    Modification or Assistance to Complete Evaluation  Min-Moderate modification of tasks or assist with assess necessary to complete eval    OT Frequency 2x / week   aticipate d/c after 8 weeks   OT Duration 12 weeks    OT Treatment/Interventions Self-care/ADL training;Ultrasound;Visual/perceptual remediation/compensation;DME and/or AE instruction;Scar mobilization;Patient/family education;Balance training;Passive range of motion;Paraffin;Gait Training;Cryotherapy;Fluidtherapy;Splinting;Functional Mobility Training;Moist Heat;Therapeutic exercise;Manual Therapy;Therapeutic activities;Cognitive remediation/compensation;Neuromuscular education    Plan review HEP, functional use RUE    Consulted and Agree with Plan of Care Patient;Family member/caregiver   wife and sister          Patient will benefit from skilled therapeutic intervention in order to improve the following deficits and impairments:   Body Structure / Function / Physical Skills: ADL,UE functional use,Muscle spasms,Endurance,Balance,Flexibility,Pain,FMC,ROM,Gait,Coordination,GMC,Sensation,Decreased knowledge of precautions,Decreased knowledge of use of DME,IADL,Dexterity,Strength,Mobility,Tone Cognitive Skills: Attention,Memory,Problem Solve,Safety Awareness,Thought,Understand     Visit Diagnosis: Spastic hemiplegia of right dominant side as late effect of cerebral infarction (HCC)  Unsteadiness on feet  Other disturbances of skin sensation  Other lack of coordination  Attention and concentration deficit  Frontal lobe and executive function deficit    Problem List Patient Active Problem List   Diagnosis Date Noted  . History of stroke 06/09/2017  . Seizure ([place]) 05/22/2016  . Anxiety state 04/20/2016   . Spastic hemiplegia affecting dominant side ([place]) 09/11/2015  . Infection of wound due to methicillin resistant Staphylococcus aureus (MRSA)   . 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 Right hemiparesis ([place]) 07/08/2015  . Acute left ACA ischemic stroke ([place]) 07/08/2015  . Cerebrovascular accident (CVA) ([place])   . Stroke ([place]) 07/02/2015  . Acute ischemic stroke ([place]) 07/02/2015  . CVA (cerebral infarction) 01/15/2013  . Essential hypertension 01/15/2013  . Nicotine dependence 01/15/2013  . Alcohol abuse 01/15/2013    [facility] 08/12/2020, 12:10 PM  [facility] [address] [place], [name], [address] Phone: [phone]   Fax:  [phone]  Name: Gerald [name] MRN: [id number] Date of Birth: [birth year]   Vianne Bulls, OTR/L [facility] [address] [place], [place]  [address] [phone] phone [phone] 08/12/20 12:10 PM

## 2020-08-12 NOTE — Therapy (Signed)
[facility] [address] [place], [name], [address] Phone: [phone]   Fax:  [phone]  Physical Therapy Treatment  Patient Details  Name: Gerald [name] MRN: [id number] Date of Birth: [birth year] Referring Provider (PT): Frann Rider, NP   Encounter Date: 08/12/2020   PT End of Session - 08/12/20 1149    Visit Number 3    Number of Visits 17    Date for PT Re-Evaluation 09/29/20    Authorization Type [employer]one co-pay if visits on same day.    Progress Note Due on Visit 10    PT Start Time 1147    PT Stop Time 1230    PT Time Calculation (min) 43 min    Equipment Utilized During Treatment Gait belt;Other (comment)   Pt's R AFO   Activity Tolerance Patient tolerated treatment well    Behavior During Therapy WFL for tasks assessed/performed           Past Medical History:  Diagnosis Date  . Alcohol abuse   . Cocaine abuse ([place]) 2014  . Hypertension   . Seizures ([place]) 07/2015   had first seizures about 6 months after stroke  . Stroke [facility]) 2014   denies residual on 07/03/2015  . Stroke ([place]) 07/02/2015   "now weak on right side; speech problems" (07/03/2015)  . Tobacco use     Past Surgical History:  Procedure Laterality Date  . COLONOSCOPY WITH PROPOFOL N/A 03/02/2019   Procedure: COLONOSCOPY WITH PROPOFOL;  Surgeon: Carol Ada, MD;  [facility];  Service: Endoscopy;  Laterality: N/A;  . ESOPHAGOGASTRODUODENOSCOPY (EGD) WITH PROPOFOL N/A 03/23/2019   Procedure: ESOPHAGOGASTRODUODENOSCOPY (EGD) WITH PROPOFOL;  Surgeon: Carol Ada, MD;  [facility];  Service: Endoscopy;  Laterality: N/A;  . POLYPECTOMY  03/02/2019   Procedure: POLYPECTOMY;  Surgeon: Carol Ada, MD;  [facility];  Service: Endoscopy;;  . Azzie Almas DILATION N/A 03/23/2019   Procedure: Azzie Almas DILATION;  Surgeon: Carol Ada, MD;  [facility];  Service: Endoscopy;  Laterality: N/A;  .  SKIN GRAFT Bilateral 1980s   "got burned by some hot water"    There were no vitals filed for this visit.   Subjective Assessment - 08/12/20 1148    Subjective No new complaitns. Did not do the ex's as they could not remember them.    Patient is accompained by: Family member   [name]   Pertinent History Hx of CVA in 2014 and 2017, has not amb. since OPPT rehab in 2018, seizures, HTN, hx of ETOH and cocaine abuse, HLD    Patient Stated Goals Be better at everything: txfs, amb, go to the bathroom.    Currently in Pain? No/denies    Pain Score 0-No pain                 [facility] Adult PT Treatment/Exercise - 08/12/20 1150      Transfers   Transfers Stand Pivot Transfers;Sit to Stand;Stand to Sit    Sit to Stand 3: Mod assist;With upper extremity assist;From chair/3-in-1    Sit to Stand Details Verbal cues for sequencing;Verbal cues for technique;Verbal cues for safe use of DME/AE;Manual facilitation for weight shifting;Manual facilitation for placement    Sit to Stand Details (indicate cue type and reason) cues on technique/sequencing. facilitation needed for anterior weight shifting, bil foot placement with standing.    Stand to Sit 3: Mod assist;With upper extremity assist;To chair/3-in-1    Stand to Sit Details (indicate cue type  and reason) Verbal cues for sequencing;Verbal cues for technique;Verbal cues for safe use of DME/AE;Manual facilitation for weight shifting;Manual facilitation for placement    Stand to Sit Details cues to reach back for controlled descent, for increased hip/knee flexion with sitting down and assist needed for controlled decent.    Stand Pivot Transfers 3: Mod assist    Stand Pivot Transfer Details (indicate cue type and reason) wheelchair<>mat table no AD with right AFO donned. Less assistance needed with improved technique with transfer toward right from mat to wheelchair.    Comments sit<>stand x2 at sink from W/C with mod assist to stand, min guard to  min assist for standing with UE support. worked on posture, lateral weight shifitng. cues for right knee extension in stance. pt able to stand for 1-1.5 minutes for each stand.      Self-Care   Self-Care Other Self-Care Comments    Other Self-Care Comments  total assist to don pt's brace. Pt/spouse report he does not wear it much at home. Discussed he need ot have the brace on to allow himself to get used to it.      Exercises   Exercises Other Exercises    Other Exercises  in supine/hooklying on mat table: reviewed and modified ex's issued at last session for HEP. Issued written instructions today after cues/assist needed in session for correct technique. Spouse shown how to assist with ex's at home. Refer to [facility] program for full details.            Issued to HEP this session: Access Code: [email or web address] URL: [email or web address] Date: 08/12/2020 Prepared by: Willow Ora  Exercises Supine Hip Adduction Isometric with Diona Foley - 1 x daily - 5 x weekly - 1 sets - 10 reps - 5 hold Supine Bridge - 1 x daily - 5 x weekly - 1 sets - 10 reps Supine Heel Slide - 1 x daily - 5 x weekly - 1 sets - 10 reps Bent Knee Fallouts - 1 x daily - 5 x weekly - 1 sets - 10 reps       PT Education - 08/12/20 2327    Education Details transfer training, importance of wearing brace, initial HEP    Person(s) Educated Patient;Spouse    Methods Explanation;Demonstration;Verbal cues;Tactile cues;Handout    Comprehension Verbalized understanding;Returned demonstration;Verbal cues required;Tactile cues required;Need further instruction            PT Short Term Goals - 07/31/20 1040      PT SHORT TERM GOAL #1   Title Pt will perform HEP with min A to improve strength, balance, flexibility. TARGET DATE FOR ALL STGS:08/28/20    Time 4    Period Weeks    Status New      PT SHORT TERM GOAL #2   Title Pt will perform w/c<>mat squat pivot txfs with min A to improve safety during funcitonal  mobility at home.    Baseline Mod-max A    Time 4    Period Weeks    Status New      PT SHORT TERM GOAL #3   Title Trial amb. when appropriate and write goals prn.    Time 4    Period Weeks    Status New      PT SHORT TERM GOAL #4   Title Pt will be able to stand with UE support for 30 seconds and min guard in order to perform ADLs.    Time 4    Period Weeks  Status New             PT Long Term Goals - 07/31/20 1043      PT LONG TERM GOAL #1   Title Pt will perform updated HEP with family supervision for improved strength, balance and gait. TARGET DATE FOR ALL LTGS:    Time 8    Period Weeks    Status New      PT LONG TERM GOAL #2   Title Pt will perform stand pivot w/c<>mat txfs with min guard to improve safety during functional mobility.    Time 8    Period Weeks    Status New      PT LONG TERM GOAL #3   Title Pt will perform sit<>stand txfs with min guard x5 reps to improve safety at home.    Time 8    Period Weeks    Status New      PT LONG TERM GOAL #4   Title Pt will stand at sink for 60 seconds with S in order to perform ADLs.    Time 8    Period Weeks    Status New                 Plan - 08/12/20 1150    Clinical Impression Statement Today's skilled session continued to address transfers, strengthening and standing tolerance at sink with cues and min/mod assist needed. Pt needed total assist to don right AFO. Pt and spouse shown to unlace shoes to make it easier to don AFO. Pt continues to need cues for right LE placement and weight shifting with transfers/standing. Also issued formal written HEP this session as pt and spouse report not being able to recall ex's issued at last session. The pt is progressing toward goals and should benefit from continued PT to progress toward unmet goals.    Personal Factors and Comorbidities Comorbidity 3+;Time since onset of injury/illness/exacerbation;Social Background    Comorbidities Hx of CVA in 2014 and  2017, has not amb. since OPPT rehab in 2018, seizures, HTN, hx of ETOH and cocaine abuse, HLD    Examination-Activity Limitations Bed Mobility;Bathing;Bend;Carry;Dressing;Hygiene/Grooming;Lift;Stand;Toileting;Squat;Sleep;Self Feeding;Locomotion Level;Transfers;Reach Overhead    Examination-Participation Restrictions Meal Prep;Driving;Interpersonal Relationship;Laundry;Medication Management;Community Activity    Stability/Clinical Decision Making Stable/Uncomplicated    Rehab Potential Fair    PT Frequency 2x / week    PT Duration 8 weeks    PT Treatment/Interventions ADLs/Self Care Home Management;Electrical Stimulation;DME Instruction;Gait training;Functional mobility training;Therapeutic activities;Therapeutic exercise;Balance training;Neuromuscular re-education;Manual techniques;Wheelchair mobility training;Orthotic Fit/Training;Patient/family education;Cognitive remediation;Vestibular    PT Next Visit Plan Standing balance at sink with pt's AFO; need to educate wife/family on transfers and standing at sink with AFO. Follow up about w/c eval for new chair. Attempt gait with R AFO and AD when appropriate.    PT Home Exercise Plan Access Code: [email or web address]    Consulted and Agree with Plan of Care Patient;Family member/caregiver    Family Member Consulted Gerald [name]: wife           Patient will benefit from skilled therapeutic intervention in order to improve the following deficits and impairments:  Difficulty walking,Decreased coordination  Visit Diagnosis: Spastic hemiplegia of right dominant side as late effect of cerebral infarction ([place])  Unsteadiness on feet  Other abnormalities of gait and mobility  Other disturbances of skin sensation     Problem List Patient Active Problem List   Diagnosis Date Noted  . History of stroke 06/09/2017  . Seizure ([place]) 05/22/2016  . Anxiety state 04/20/2016  .  Spastic hemiplegia affecting dominant side ([place]) 09/11/2015  . Infection of wound due to  methicillin resistant Staphylococcus aureus (MRSA)   . 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 Right hemiparesis ([place]) 07/08/2015  . Acute left ACA ischemic stroke ([place]) 07/08/2015  . Cerebrovascular accident (CVA) ([place])   . Stroke ([place]) 07/02/2015  . Acute ischemic stroke ([place]) 07/02/2015  . CVA (cerebral infarction) 01/15/2013  . Essential hypertension 01/15/2013  . Nicotine dependence 01/15/2013  . Alcohol abuse 01/15/2013    Willow Ora, PTA, [facility] [address], [place], [place] [address] [phone] 08/12/20, 11:38 PM    Name: Gerald [name] MRN: [id number] Date of Birth: 1960-09-28

## 2020-08-12 NOTE — Patient Instructions (Signed)
Access Code: [email or web address] URL: [email or web address] Date: 08/12/2020 Prepared by: Willow Ora  Exercises Supine Hip Adduction Isometric with Diona Foley - 1 x daily - 5 x weekly - 1 sets - 10 reps - 5 hold Supine Bridge - 1 x daily - 5 x weekly - 1 sets - 10 reps Supine Heel Slide - 1 x daily - 5 x weekly - 1 sets - 10 reps Bent Knee Fallouts - 1 x daily - 5 x weekly - 1 sets - 10 reps

## 2020-08-13 ENCOUNTER — Telehealth: Payer: Self-pay | Admitting: Physical Therapy

## 2020-08-13 DIAGNOSIS — Z993 Dependence on wheelchair: Secondary | ICD-10-CM

## 2020-08-13 DIAGNOSIS — I69351 Hemiplegia and hemiparesis following cerebral infarction affecting right dominant side: Secondary | ICD-10-CM

## 2020-08-13 NOTE — Telephone Encounter (Signed)
Gerald [name] is currently working with PT at [facility], due to history of spastic hemiparesis from CVA.  At the PT evaluation, it was noted that his current manual wheelchair (w/c) is in disrepair, and his wife reports the w/c is greater than 58 years old.  Could you please write an order for a manual w/c assessment?   We will work with a vendor to assess pt's wheelchair needs and follow up with additional recommendations as necessary.  Thank you.  Mady Haagensen, PT 08/13/20 6:11 PM Phone: [phone] Fax: [phone]

## 2020-08-14 ENCOUNTER — Encounter: Payer: Self-pay | Admitting: Occupational Therapy

## 2020-08-14 ENCOUNTER — Ambulatory Visit: Payer: PPO | Admitting: Occupational Therapy

## 2020-08-14 ENCOUNTER — Other Ambulatory Visit: Payer: Self-pay

## 2020-08-14 ENCOUNTER — Ambulatory Visit: Payer: PPO

## 2020-08-14 DIAGNOSIS — R41844 Frontal lobe and executive function deficit: Secondary | ICD-10-CM

## 2020-08-14 DIAGNOSIS — I69351 Hemiplegia and hemiparesis following cerebral infarction affecting right dominant side: Secondary | ICD-10-CM

## 2020-08-14 DIAGNOSIS — R2681 Unsteadiness on feet: Secondary | ICD-10-CM

## 2020-08-14 DIAGNOSIS — R278 Other lack of coordination: Secondary | ICD-10-CM

## 2020-08-14 DIAGNOSIS — R208 Other disturbances of skin sensation: Secondary | ICD-10-CM

## 2020-08-14 DIAGNOSIS — R41840 Attention and concentration deficit: Secondary | ICD-10-CM

## 2020-08-14 NOTE — Therapy (Signed)
[facility] [address] [place], [name], [address] Phone: [phone]   Fax:  [phone]  Physical Therapy Treatment  Patient Details  Name: Gerald [name] MRN: [id number] Date of Birth: [birth year] Referring Provider (PT): Frann Rider, NP   Encounter Date: 08/14/2020   PT End of Session - 08/14/20 2011    Visit Number 4    Number of Visits 17    Date for PT Re-Evaluation 09/29/20    Authorization Type [employer] co-pay if visits on same day.    Progress Note Due on Visit 10    PT Start Time 1410    PT Stop Time 1445    PT Time Calculation (min) 35 min    Equipment Utilized During Treatment Gait belt;Other (comment)    Activity Tolerance Patient tolerated treatment well;Patient limited by fatigue    Behavior During Therapy [facility] for tasks assessed/performed           Past Medical History:  Diagnosis Date  . Alcohol abuse   . Cocaine abuse ([place]) 2014  . Hypertension   . Seizures ([place]) 07/2015   had first seizures about 6 months after stroke  . Stroke [facility]) 2014   denies residual on 07/03/2015  . Stroke ([place]) 07/02/2015   "now weak on right side; speech problems" (07/03/2015)  . Tobacco use     Past Surgical History:  Procedure Laterality Date  . COLONOSCOPY WITH PROPOFOL N/A 03/02/2019   Procedure: COLONOSCOPY WITH PROPOFOL;  Surgeon: Carol Ada, MD;  Location: [facility];  Service: Endoscopy;  Laterality: N/A;  . ESOPHAGOGASTRODUODENOSCOPY (EGD) WITH PROPOFOL N/A 03/23/2019   Procedure: ESOPHAGOGASTRODUODENOSCOPY (EGD) WITH PROPOFOL;  Surgeon: Carol Ada, MD;  [facility];  Service: Endoscopy;  Laterality: N/A;  . POLYPECTOMY  03/02/2019   Procedure: POLYPECTOMY;  Surgeon: Carol Ada, MD;  Location: [facility];  Service: Endoscopy;;  . Azzie Almas DILATION N/A 03/23/2019   Procedure: Azzie Almas DILATION;  Surgeon: Carol Ada, MD;  [facility];  Service: Endoscopy;   Laterality: N/A;  . SKIN GRAFT Bilateral 1980s   "got burned by some hot water"    There were no vitals filed for this visit.   Subjective Assessment - 08/14/20 2003    Subjective Have bee more compliant with HEP (did not review due to time constraints)    Patient is accompained by: Family member   [name]   Pertinent History Hx of CVA in 2014 and 2017, has not amb. since OPPT rehab in 2018, seizures, HTN, hx of ETOH and cocaine abuse, HLD    Limitations Walking;Standing    How long can you sit comfortably? unlimited    How long can you stand comfortably? 1 min    How long can you walk comfortably? n/a    Patient Stated Goals Be better at everything: txfs, amb, go to the bathroom.    Currently in Pain? No/denies    Pain Onset More than a month ago                             [facility] Adult PT Treatment/Exercise - 08/14/20 0001      Transfers   Transfers Stand Pivot Transfers;Squat Pivot Transfers    Sit to Stand 4: Min assist;3: Mod assist    Sit to Stand Details (indicate cue type and reason) verbal and tactile cues needed for weight shifting, body mechanics and form    Stand to Sit 3:  Mod assist    Stand to Sit Details (indicate cue type and reason) Tactile cues for sequencing;Tactile cues for weight shifting;Tactile cues for placement;Verbal cues for sequencing;Verbal cues for technique;Verbal cues for precautions/safety    Stand to Sit Details does not control decent into chair or rock forward when attempting to stand or transfer    Stand Pivot Transfers 3: Mod assist    Stand Pivot Transfer Details (indicate cue type and reason) WC<>mat toward strong side    Comments STS at sink x3 standing supported for 90/90/60s, cued to correct posture as well as weight shift               Balance Exercises - 08/14/20 0001      Balance Exercises: Seated   Dynamic Sitting Eyes opened;Upper extremity support - 1;Lower extremity support - 2;Anterior/posterior weight  shift;Lateral weight shift;Reaching outside base of support;Limitations    Dynamic Sitting Limitations weight shifting fwd onto rolling table then bwd using physioball as brace, advanced to lateral WS with respective arm on physioball to provide support, 10x ea. direction  with tactile cues from PT to maintain form    Other Seated Exercises Dynamic seated balnce tasks of placing unweighted ball from hip to hip 10x per extremity in alternating manner, then reaching to various positions to place and retrive ball, alternating UEs for 2 min duration             PT Education - 08/14/20 2010    Education Details Wear R AFO to build tolerance    Person(s) Educated Patient;Spouse    Methods Explanation;Demonstration    Comprehension Verbalized understanding;Verbal cues required;Need further instruction            PT Short Term Goals - 07/31/20 1040      PT SHORT TERM GOAL #1   Title Pt will perform HEP with min A to improve strength, balance, flexibility. TARGET DATE FOR ALL STGS:08/28/20    Time 4    Period Weeks    Status New      PT SHORT TERM GOAL #2   Title Pt will perform w/c<>mat squat pivot txfs with min A to improve safety during funcitonal mobility at home.    Baseline Mod-max A    Time 4    Period Weeks    Status New      PT SHORT TERM GOAL #3   Title Trial amb. when appropriate and write goals prn.    Time 4    Period Weeks    Status New      PT SHORT TERM GOAL #4   Title Pt will be able to stand with UE support for 30 seconds and min guard in order to perform ADLs.    Time 4    Period Weeks    Status New             PT Long Term Goals - 07/31/20 1043      PT LONG TERM GOAL #1   Title Pt will perform updated HEP with family supervision for improved strength, balance and gait. TARGET DATE FOR ALL LTGS:    Time 8    Period Weeks    Status New      PT LONG TERM GOAL #2   Title Pt will perform stand pivot w/c<>mat txfs with min guard to improve safety during  functional mobility.    Time 8    Period Weeks    Status New      PT LONG  TERM GOAL #3   Title Pt will perform sit<>stand txfs with min guard x5 reps to improve safety at home.    Time 8    Period Weeks    Status New      PT LONG TERM GOAL #4   Title Pt will stand at sink for 60 seconds with S in order to perform ADLs.    Time 8    Period Weeks    Status New                 Plan - 08/14/20 2013    Clinical Impression Statement todays skilled session focused on transfer training, seated balance activities and building standing tolerances, he was able to stand for greater periods of time as well as participtae in seated balance challenges through reaching activities, some initial apprehension noted initially but declined as session wore on, he was able to stand an additional time and stand pivot transfer with slightly less assist.  Discussed WC vendor and patient and spouse had no paticular preference    Personal Factors and Comorbidities Comorbidity 3+;Time since onset of injury/illness/exacerbation;Social Background    Comorbidities Hx of CVA in 2014 and 2017, has not amb. since OPPT rehab in 2018, seizures, HTN, hx of ETOH and cocaine abuse, HLD    Examination-Activity Limitations Bed Mobility;Bathing;Bend;Carry;Dressing;Hygiene/Grooming;Lift;Stand;Toileting;Squat;Sleep;Self Feeding;Locomotion Level;Transfers;Reach Overhead    Examination-Participation Restrictions Meal Prep;Driving;Interpersonal Relationship;Laundry;Medication Management;Community Activity    Stability/Clinical Decision Making Stable/Uncomplicated    Rehab Potential Fair    PT Frequency 2x / week    PT Duration 8 weeks    PT Treatment/Interventions ADLs/Self Care Home Management;Electrical Stimulation;DME Instruction;Gait training;Functional mobility training;Therapeutic activities;Therapeutic exercise;Balance training;Neuromuscular re-education;Manual techniques;Wheelchair mobility training;Orthotic  Fit/Training;Patient/family education;Cognitive remediation;Vestibular    PT Next Visit Plan Continue to build on standing tolerance, transfer skills and seated balance improvements geared towards reducing fall risk, promoting functional independence and reducing CG burden, AFO wear time and assisting CG in donning/doffing    PT Home Exercise Plan Access Code: [email or web address]    Consulted and Agree with Plan of Care Patient;Family member/caregiver    Family Member Consulted Gerald [name]: wife           Patient will benefit from skilled therapeutic intervention in order to improve the following deficits and impairments:  Difficulty walking,Decreased coordination  Visit Diagnosis: Unsteadiness on feet  Spastic hemiplegia of right dominant side as late effect of cerebral infarction [facility])     Problem List Patient Active Problem List   Diagnosis Date Noted  . History of stroke 06/09/2017  . Seizure ([place]) 05/22/2016  . Anxiety state 04/20/2016  . Spastic hemiplegia affecting dominant side ([place]) 09/11/2015  . Infection of wound due to methicillin resistant Staphylococcus aureus (MRSA)   . 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 Right hemiparesis ([place]) 07/08/2015  . Acute left ACA ischemic stroke ([place]) 07/08/2015  . Cerebrovascular accident (CVA) ([place])   . Stroke ([place]) 07/02/2015  . Acute ischemic stroke ([place]) 07/02/2015  . CVA (cerebral infarction) 01/15/2013  . Essential hypertension 01/15/2013  . Nicotine dependence 01/15/2013  . Alcohol abuse 01/15/2013    Lanice Shirts 08/14/2020, 8:54 PM  [facility] [address] [place], [name], [address] Phone: [phone]   Fax:  [phone]  Name: Gerald [name] MRN: [id number] Date of Birth: 05/04/1961

## 2020-08-14 NOTE — Therapy (Signed)
[facility] [address] [place], [name], [address] Phone: [phone]   Fax:  [phone]  Occupational Therapy Treatment  Patient Details  Name: Gerald [name] MRN: [id number] Date of Birth: Apr 29, 1961 Referring Provider (OT): Frann Rider , NP   Encounter Date: 08/14/2020   OT End of Session - 08/14/20 1452    Visit Number 3    Number of Visits 25    Date for OT Re-Evaluation 10/23/20    Authorization Type [employer]    Authorization Time Period 90 day anticpate -d/c after 8 weeks    Authorization - Visit Number 3    Authorization - Number of Visits 10    OT Start Time 1450    OT Stop Time 1530    OT Time Calculation (min) 40 min    Activity Tolerance Patient tolerated treatment well    Behavior During Therapy [facility] for tasks assessed/performed           Past Medical History:  Diagnosis Date  . Alcohol abuse   . Cocaine abuse ([place]) 2014  . Hypertension   . Seizures ([place]) 07/2015   had first seizures about 6 months after stroke  . Stroke [facility]) 2014   denies residual on 07/03/2015  . Stroke ([place]) 07/02/2015   "now weak on right side; speech problems" (07/03/2015)  . Tobacco use     Past Surgical History:  Procedure Laterality Date  . COLONOSCOPY WITH PROPOFOL N/A 03/02/2019   Procedure: COLONOSCOPY WITH PROPOFOL;  Surgeon: Carol Ada, MD;  [facility];  Service: Endoscopy;  Laterality: N/A;  . ESOPHAGOGASTRODUODENOSCOPY (EGD) WITH PROPOFOL N/A 03/23/2019   Procedure: ESOPHAGOGASTRODUODENOSCOPY (EGD) WITH PROPOFOL;  Surgeon: Carol Ada, MD;  [facility];  Service: Endoscopy;  Laterality: N/A;  . POLYPECTOMY  03/02/2019   Procedure: POLYPECTOMY;  Surgeon: Carol Ada, MD;  [facility];  Service: Endoscopy;;  . Azzie Almas DILATION N/A 03/23/2019   Procedure: Azzie Almas DILATION;  Surgeon: Carol Ada, MD;  [facility];  Service: Endoscopy;  Laterality:  N/A;  . SKIN GRAFT Bilateral 1980s   "got burned by some hot water"    There were no vitals filed for this visit.   Subjective Assessment - 08/14/20 1451    Subjective  no denies    Pertinent History Gerald [name] is a 60 y.o. male with history of ischemic stroke in 08/2015 resulting in right hemiparesis, dysarthria and seizure disorder.  PMH: alcohol abuse,HTN, seizure, CVA. Pt is using bacholphen fo spasticity, pr previously received botox, however spasticity is worse now that he is not receiving botox.    Patient Stated Goals to get better    Currently in Pain? No/denies                 Treatment:Reveiwed functional activities HEP, min-mod v.c and tactile cues, Pt demonstrates improving functional use of RUE, however continued difficulty with release of items. Placing and removing wooden dowel pegs from pegboard to increased coordination and functional reach, min -mod v.c, min difficulty, increased time required               OT Education - 08/14/20 1514    Education Details initial HEP review min-mod v.c    Person(s) Educated Patient;Spouse    Methods Explanation;Demonstration;Tactile cues;Verbal cues    Comprehension Verbalized understanding;Returned demonstration;Verbal cues required;Tactile cues required            OT Short Term Goals - 07/31/20 1707      OT  SHORT TERM GOAL #1   Title Pt will perform initial HEP with min cueing. 08/29/20    Time 4    Period Weeks    Status New    Target Date 08/29/20      OT SHORT TERM GOAL #2   Title Pt will use RUE as a gross assist/stabilizer at least 25% of the time.    Baseline uses 5%    Time 4    Period Weeks    Status New      OT SHORT TERM GOAL #3   Title Pt will demo at least 55 * shoulder flex for functional reach    Baseline 45    Time 4    Period Weeks    Status New      OT SHORT TERM GOAL #4   Title Pt will demonstrate ability to grasp and relase a 1 inch block into container 3/5 trials     Time 4    Period Weeks    Status New             OT Long Term Goals - 08/01/20 0910      OT LONG TERM GOAL #1   Title Pt/caregiver will be independent with updated HEP.-10/23/20    Time 12    Status New      OT LONG TERM GOAL #2   Title Pt will demo at least 65* shoulder flex for functional reach .    Time 12    Period Weeks    Status New      OT LONG TERM GOAL #3   Title Pt will use RUE as a gross A for ADLS at least 64% of the time.    Time 12    Period Weeks    Status New      OT LONG TERM GOAL #4   Title Pt will demonstrate A/ROM  elbow extension -50 for increased functional reach.    Baseline -60    Time 12    Period Weeks    Status New      OT LONG TERM GOAL #5   Title Pt will be able to consistently release object in R hand 4/5 trials to place in container    Baseline unable to relase for box/ blocks    Time 12    Period Weeks    Status New      Long Term Additional Goals   Additional Long Term Goals Yes      OT LONG TERM GOAL #6   Title Pt will perform toilet transfers with min A    Time 12    Period Weeks    Status New                 Plan - 08/14/20 1510    Clinical Impression Statement Pt is progressing towards goals with improving ability to release objects with prompts and cueing  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    OT Occupational Profile and History Detailed Assessment- Review of Records and additional review of physical, cognitive, psychosocial history related to current functional performance    Occupational performance deficits (Please refer to evaluation for details): ADL's;IADL's;Leisure;Social Participation    Body Structure / Function / Physical Skills ADL;UE functional use;Muscle spasms;Endurance;Balance;Flexibility;Pain;FMC;ROM;Gait;Coordination;GMC;Sensation;Decreased knowledge of precautions;Decreased knowledge of use of DME;IADL;Dexterity;Strength;Mobility;Tone    Cognitive Skills Attention;Memory;Problem Solve;Safety  Awareness;Thought;Understand    Rehab Potential Good    Clinical Decision Making Limited treatment options, no task modification necessary    Comorbidities  Affecting Occupational Performance: May have comorbidities impacting occupational performance    Modification or Assistance to Complete Evaluation  Min-Moderate modification of tasks or assist with assess necessary to complete eval    OT Frequency 2x / week   aticipate d/c after 8 weeks   OT Duration 12 weeks    OT Treatment/Interventions Self-care/ADL training;Ultrasound;Visual/perceptual remediation/compensation;DME and/or AE instruction;Scar mobilization;Patient/family education;Balance training;Passive range of motion;Paraffin;Gait Training;Cryotherapy;Fluidtherapy;Splinting;Functional Mobility Training;Moist Heat;Therapeutic exercise;Manual Therapy;Therapeutic activities;Cognitive remediation/compensation;Neuromuscular education    Plan functional use of RUE    Consulted and Agree with Plan of Care Patient;Family member/caregiver   wife and sister          Patient will benefit from skilled therapeutic intervention in order to improve the following deficits and impairments:   Body Structure / Function / Physical Skills: ADL,UE functional use,Muscle spasms,Endurance,Balance,Flexibility,Pain,FMC,ROM,Gait,Coordination,GMC,Sensation,Decreased knowledge of precautions,Decreased knowledge of use of DME,IADL,Dexterity,Strength,Mobility,Tone Cognitive Skills: Attention,Memory,Problem Solve,Safety Awareness,Thought,Understand     Visit Diagnosis: Spastic hemiplegia of right dominant side as late effect of cerebral infarction ([place])  Other disturbances of skin sensation  Other lack of coordination  Attention and concentration deficit  Frontal lobe and executive function deficit    Problem List Patient Active Problem List   Diagnosis Date Noted  . History of stroke 06/09/2017  . Seizure ([place]) 05/22/2016  . Anxiety state 04/20/2016   . Spastic hemiplegia affecting dominant side ([place]) 09/11/2015  . Infection of wound due to methicillin resistant Staphylococcus aureus (MRSA)   . 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 Right hemiparesis ([place]) 07/08/2015  . Acute left ACA ischemic stroke ([place]) 07/08/2015  . Cerebrovascular accident (CVA) ([place])   . Stroke ([place]) 07/02/2015  . Acute ischemic stroke ([place]) 07/02/2015  . CVA (cerebral infarction) 01/15/2013  . Essential hypertension 01/15/2013  . Nicotine dependence 01/15/2013  . Alcohol abuse 01/15/2013    Gerald [name] 08/14/2020, 3:24 PM  [facility] [address] [place], [name], [address] Phone: [phone]   Fax:  [phone]  Name: Gerald [name] MRN: [id number] Date of Birth: [birth year]

## 2020-08-14 NOTE — Patient Instructions (Signed)
Wear R AFO to build tolerance

## 2020-08-19 ENCOUNTER — Encounter: Payer: Self-pay | Admitting: Physical Therapy

## 2020-08-19 ENCOUNTER — Other Ambulatory Visit: Payer: Self-pay

## 2020-08-19 ENCOUNTER — Ambulatory Visit: Payer: PPO | Admitting: Physical Therapy

## 2020-08-19 ENCOUNTER — Ambulatory Visit: Payer: PPO | Admitting: Occupational Therapy

## 2020-08-19 DIAGNOSIS — R41844 Frontal lobe and executive function deficit: Secondary | ICD-10-CM

## 2020-08-19 DIAGNOSIS — R2689 Other abnormalities of gait and mobility: Secondary | ICD-10-CM

## 2020-08-19 DIAGNOSIS — R2681 Unsteadiness on feet: Secondary | ICD-10-CM

## 2020-08-19 DIAGNOSIS — R41840 Attention and concentration deficit: Secondary | ICD-10-CM

## 2020-08-19 DIAGNOSIS — I69351 Hemiplegia and hemiparesis following cerebral infarction affecting right dominant side: Secondary | ICD-10-CM | POA: Diagnosis not present

## 2020-08-19 DIAGNOSIS — R208 Other disturbances of skin sensation: Secondary | ICD-10-CM

## 2020-08-19 DIAGNOSIS — R278 Other lack of coordination: Secondary | ICD-10-CM
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
## 2020-08-22 ENCOUNTER — Ambulatory Visit: Payer: PPO | Admitting: Physical Therapy

## 2020-08-22 ENCOUNTER — Other Ambulatory Visit: Payer: Self-pay

## 2020-08-22 ENCOUNTER — Encounter: Payer: Self-pay | Admitting: Physical Therapy

## 2020-08-22 ENCOUNTER — Ambulatory Visit: Payer: PPO

## 2020-08-22 DIAGNOSIS — R2689 Other abnormalities of gait and mobility: Secondary | ICD-10-CM

## 2020-08-22 DIAGNOSIS — R471 Dysarthria and anarthria: Secondary | ICD-10-CM

## 2020-08-22 DIAGNOSIS — R208 Other disturbances of skin sensation: Secondary | ICD-10-CM

## 2020-08-22 DIAGNOSIS — R4701 Aphasia: Secondary | ICD-10-CM

## 2020-08-22 DIAGNOSIS — I69351 Hemiplegia and hemiparesis following cerebral infarction affecting right dominant side: Secondary | ICD-10-CM | POA: Diagnosis not present

## 2020-08-22 DIAGNOSIS — R2681 Unsteadiness on feet: Secondary | ICD-10-CM

## 2020-08-22 DIAGNOSIS — R278 Other lack of coordination: Secondary | ICD-10-CM

## 2020-08-22 DIAGNOSIS — R41841 Cognitive communication deficit: Secondary | ICD-10-CM

## 2020-08-22 NOTE — Patient Instructions (Signed)
   SLOW LOUD OVER-ENNUNCIATE PAUSE  PA TA KA  PATA TAKA KAPA PATAKA  BUTTERCUP  CATERPILLAR  BASEBALLL PLAYER  [place]  [place]  SLOW AND BIG - EXAGGERATE YOUR MOUTH, MAKE EACH CONSONANT        Tip of Tongue  1. Say the sound  "ta ta ta," "la la la,"          and "Engineering geologist."  Repeat __2__ times.               "tee tee tee" "lee lee lee"   "dee dee dee"        "too too too" "[place]"  "do do do"  2. Say "time," "took," "take," "town," and "Tom."  Repeat __2__ times.  3. Say "long," "look," "low," "lay" and "lie."  Repeat _2__ times.  4. Say "name," "neck," "not," "new," and "no."  Repeat __2_ times.  5. Say "down," "dip," "dot," "Don," and "do."  Repeat __2__ times   Back of Tongue  1. Say the sounds "ka ka ka" and "go go go."   Repeat ___2_ times.       "cow cow cow" "guy guy guy"       "koo koo koo" "goo goo goo"  2. Say "kick," "cake," "Anda [employer]raft," "key," "keep," "kite," and "Ken."  Repeat __2__ times.  3. Say "gate," "gag," "got," "get," "gone," and "go."  Repeat __2__ times.   Purpose: To improve lip and tongue strength in order to help people understand you. These exercises can be done while looking in a mirror.  Do them twice a day.  Lips  1. Press hard and briefly hold all the beginning sounds in these words: Repeat each set 2 times     "ma ma ma"    "boo boo boo"  "pa pa pa."          "[place]" "bye bye bye"  "pie pie pie"          "me me me"  "bee bee bee"  "pea pea pea"   2. Pucker your lips tightly and say "OOOO," then smile wide and say "EEEE."  Repeat _2_ times.  3. Practice whistling for __30__ seconds.  4. "Blow out" candles.  Repeat _10___ times.  5. Blow kisses - make them LOUD!  Repeat __10__ times.
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
## 2020-08-26 ENCOUNTER — Other Ambulatory Visit: Payer: Self-pay

## 2020-08-26 ENCOUNTER — Ambulatory Visit: Payer: PPO

## 2020-08-26 ENCOUNTER — Ambulatory Visit: Payer: PPO | Admitting: Physical Therapy

## 2020-08-26 ENCOUNTER — Ambulatory Visit: Payer: PPO | Admitting: Occupational Therapy

## 2020-08-26 DIAGNOSIS — I69351 Hemiplegia and hemiparesis following cerebral infarction affecting right dominant side: Secondary | ICD-10-CM

## 2020-08-26 DIAGNOSIS — R471 Dysarthria and anarthria: Secondary | ICD-10-CM

## 2020-08-26 DIAGNOSIS — R2681 Unsteadiness on feet: Secondary | ICD-10-CM

## 2020-08-26 DIAGNOSIS — R2689 Other abnormalities of gait and mobility: Secondary | ICD-10-CM

## 2020-08-26 DIAGNOSIS — R41844 Frontal lobe and executive function deficit: Secondary | ICD-10-CM

## 2020-08-26 DIAGNOSIS — R4701 Aphasia: Secondary | ICD-10-CM

## 2020-08-26 DIAGNOSIS — R278 Other lack of coordination: Secondary | ICD-10-CM

## 2020-08-26 DIAGNOSIS — R208 Other disturbances of skin sensation: Secondary | ICD-10-CM

## 2020-08-26 DIAGNOSIS — R41840 Attention and concentration deficit: Secondary | ICD-10-CM

## 2020-08-26 NOTE — Patient Instructions (Signed)
  Practice these sentences:  1) How you doing? 2) How is the family doing? 3) I want a drink. 4) Come here.   5) What are we eating? 6) I am hungry.  7) Get me something to eat.  8) 9) 10)
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
## 2020-08-26 NOTE — Therapy (Signed)
[facility] [address] [place], [name], [address] Phone: [phone]   Fax:  [phone]  Physical Therapy Treatment  Patient Details  Name: Gerald [name] MRN: [id number] Date of Birth: 04/12/1961 Referring Provider (PT): Frann Rider, NP   Encounter Date: 08/26/2020   PT End of Session - 08/26/20 1238    Visit Number 7    Number of Visits 17    Date for PT Re-Evaluation 09/29/20    Authorization Type [employer] co-pay if visits on same day.    Progress Note Due on Visit 10    PT Start Time 1234    PT Stop Time 1315    PT Time Calculation (min) 41 min    Equipment Utilized During Treatment Gait belt;Other (comment)   Pt's R AFO; wife has helped him don it prior to session   Activity Tolerance Patient tolerated treatment well    Behavior During Therapy [facility] for tasks assessed/performed           Past Medical History:  Diagnosis Date  . Alcohol abuse   . Cocaine abuse ([place]) 2014  . Hypertension   . Seizures ([place]) 07/2015   had first seizures about 6 months after stroke  . Stroke [facility]) 2014   denies residual on 07/03/2015  . Stroke ([place]) 07/02/2015   "now weak on right side; speech problems" (07/03/2015)  . Tobacco use     Past Surgical History:  Procedure Laterality Date  . COLONOSCOPY WITH PROPOFOL N/A 03/02/2019   Procedure: COLONOSCOPY WITH PROPOFOL;  Surgeon: Carol Ada, MD;  [facility];  Service: Endoscopy;  Laterality: N/A;  . ESOPHAGOGASTRODUODENOSCOPY (EGD) WITH PROPOFOL N/A 03/23/2019   Procedure: ESOPHAGOGASTRODUODENOSCOPY (EGD) WITH PROPOFOL;  Surgeon: Carol Ada, MD;  [facility];  Service: Endoscopy;  Laterality: N/A;  . POLYPECTOMY  03/02/2019   Procedure: POLYPECTOMY;  Surgeon: Carol Ada, MD;  [facility];  Service: Endoscopy;;  . Azzie Almas DILATION N/A 03/23/2019   Procedure: Azzie Almas DILATION;  Surgeon: Carol Ada, MD;  [facility];   Service: Endoscopy;  Laterality: N/A;  . SKIN GRAFT Bilateral 1980s   "got burned by some hot water"    There were no vitals filed for this visit.   Subjective Assessment - 08/26/20 1238    Subjective Haven't tried standing at home.  Wife has a question about helping to get patient off floor; she reports the other day, his lift chair stood him up too high and he slid down to the floor.  She had neighbor come help her get him up.    Patient is accompained by: Family member   wife [name]   Pertinent History Hx of CVA in 2014 and 2017, has not amb. since OPPT rehab in 2018, seizures, HTN, hx of ETOH and cocaine abuse, HLD    Limitations Walking;Standing    How long can you sit comfortably? unlimited    How long can you stand comfortably? 1 min    How long can you walk comfortably? n/a    Patient Stated Goals Be better at everything: txfs, amb, go to the bathroom.    Currently in Pain? No/denies    Pain Onset More than a month ago                             [facility] Adult PT Treatment/Exercise - 08/26/20 0001      Transfers   Transfers Squat Pivot Transfers;Sit  to Stand;Stand to Sit;Floor to Transfer    Sit to Stand 3: Mod assist;4: Min assist;4: Min guard   Mod assist from w/c to stand for clothing management.  Min assist/min guard assist for sit>stand in STEDY lift   Sit to Stand Details Verbal cues for sequencing;Verbal cues for technique;Verbal cues for safe use of DME/AE;Manual facilitation for weight shifting;Manual facilitation for placement    Sit to Stand Details (indicate cue type and reason) Using STEDY, pt performs sit<>stand with hands on bars to assist to pull up to stand.  Pt performs sit<>stand 5 reps from upright sit position in STEDY, 2 sets.  Tactile cues for increased forward lean and weightshift through RLE    Stand to Sit 4: Min assist;3: Mod assist;With upper extremity assist;To chair/3-in-1;To elevated surface   to Stedy elevated seat   Stand to Sit  Details (indicate cue type and reason) Tactile cues for sequencing;Tactile cues for weight shifting;Tactile cues for placement;Verbal cues for sequencing;Verbal cues for technique;Verbal cues for precautions/safety    Squat Pivot Transfers 3: Mod assist;With upper extremity assistance    Squat Pivot Transfer Details (indicate cue type and reason) w/c<>mat with cues for scooting, hand placement and foot placement.  With preparation and cues, pt performs slow and controlled, 4 reps in session    Floor to Transfer Other (comment)    Floor to Transfer Details (indicate cue type and reason) Wife wants to know best way to get patient up from the floor, as pt slid down to floor over the weekend and needed neighbor's assist to lift from floor.  Initially performed w/c>mat, to tall kneel position with +2 max assist.  Assisted tall kneel to side sitting.  Attempted side sit>tall kneeling x 2 reps, max assist, pt unable to maintain long enough come up with assist from this position.  Then assisted pt back to side sit>long sit position.  With +3 total assist, went from long sit to up to stand, then to sit in w/c behind him.  PT educated wife that at this time, she will need total assist for transfers from the floor; educated that she can contact the fire department/EMS to assist pt to get up from floor if needed.          From elevated sitting>stand in STEDY, pt stands, 3 reps, 45 sec-1 minute with min guard assist.          PT Short Term Goals - 07/31/20 1040      PT SHORT TERM GOAL #1   Title Pt will perform HEP with min A to improve strength, balance, flexibility. TARGET DATE FOR ALL STGS:08/28/20    Time 4    Period Weeks    Status New      PT SHORT TERM GOAL #2   Title Pt will perform w/c<>mat squat pivot txfs with min A to improve safety during funcitonal mobility at home.    Baseline Mod-max A    Time 4    Period Weeks    Status New      PT SHORT TERM GOAL #3   Title Trial amb. when  appropriate and write goals prn.    Time 4    Period Weeks    Status New      PT SHORT TERM GOAL #4   Title Pt will be able to stand with UE support for 30 seconds and min guard in order to perform ADLs.    Time 4    Period Weeks  Status New             PT Long Term Goals - 07/31/20 1043      PT LONG TERM GOAL #1   Title Pt will perform updated HEP with family supervision for improved strength, balance and gait. TARGET DATE FOR ALL LTGS:    Time 8    Period Weeks    Status New      PT LONG TERM GOAL #2   Title Pt will perform stand pivot w/c<>mat txfs with min guard to improve safety during functional mobility.    Time 8    Period Weeks    Status New      PT LONG TERM GOAL #3   Title Pt will perform sit<>stand txfs with min guard x5 reps to improve safety at home.    Time 8    Period Weeks    Status New      PT LONG TERM GOAL #4   Title Pt will stand at sink for 60 seconds with S in order to perform ADLs.    Time 8    Period Weeks    Status New                 Plan - 08/26/20 1542    Clinical Impression Statement Most of today's session focused on transfers:  squat pivot, sit<>stand (using STEDY assist), and floor>stand transfer.  Floor to stand transfer is based on wife's request from an incident this weekend at home.  Pt requires +3 max/total assist to position himself up to stand, so PT educated pt/wife that they will likely need additional assistance to get up from floor in the immediate future.    Personal Factors and Comorbidities Comorbidity 3+;Time since onset of injury/illness/exacerbation;Social Background    Comorbidities Hx of CVA in 2014 and 2017, has not amb. since OPPT rehab in 2018, seizures, HTN, hx of ETOH and cocaine abuse, HLD    Examination-Activity Limitations Bed Mobility;Bathing;Bend;Carry;Dressing;Hygiene/Grooming;Lift;Stand;Toileting;Squat;Sleep;Self Feeding;Locomotion Level;Transfers;Reach Overhead    Examination-Participation  Restrictions Meal Prep;Driving;Interpersonal Relationship;Laundry;Medication Management;Community Activity    Stability/Clinical Decision Making Stable/Uncomplicated    Rehab Potential Fair    PT Frequency 2x / week    PT Duration 8 weeks    PT Treatment/Interventions ADLs/Self Care Home Management;Electrical Stimulation;DME Instruction;Gait training;Functional mobility training;Therapeutic activities;Therapeutic exercise;Balance training;Neuromuscular re-education;Manual techniques;Wheelchair mobility training;Orthotic Fit/Training;Patient/family education;Cognitive remediation;Vestibular    PT Next Visit Plan STGs due this week.  Letta Moynahan) discussed power w/c as an option but wasn't able to trial due to not being able to move joystick over (screw was stripped); continue to work on standing-pt liked STEDY assist for standing activities today;  WB through RLE, timing and safety with sit > stand and transfers.    PT Home Exercise Plan Access Code: [email or web address]    Consulted and Agree with Plan of Care Patient;Family member/caregiver    Family Member Consulted [name]: wife           Patient will benefit from skilled therapeutic intervention in order to improve the following deficits and impairments:  Difficulty walking,Decreased coordination  Visit Diagnosis: Unsteadiness on feet  Spastic hemiplegia of right dominant side as late effect of cerebral infarction [facility])     Problem List Patient Active Problem List   Diagnosis Date Noted  . History of stroke 06/09/2017  . Seizure ([place]) 05/22/2016  . Anxiety state 04/20/2016  . Spastic hemiplegia affecting dominant side ([place]) 09/11/2015  . Infection of wound due to methicillin resistant Staphylococcus aureus (MRSA)   .  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 Right hemiparesis ([place]) 07/08/2015  . Acute left ACA ischemic stroke ([place]) 07/08/2015  .  Cerebrovascular accident (CVA) ([place])   . Stroke ([place]) 07/02/2015  . Acute ischemic stroke ([place]) 07/02/2015  . CVA (cerebral infarction) 01/15/2013  . Essential hypertension 01/15/2013  . Nicotine dependence 01/15/2013  . Alcohol abuse 01/15/2013    Vicki Pasqual W. 08/26/2020, 3:46 PM Frazier Butt., PT  [facility] [address] [place], [name], [address] Phone: [phone]   Fax:  [phone]  Name: BERTIE SIMIEN MRN: [id number] Date of Birth: 06/08/1961

## 2020-08-28 ENCOUNTER — Ambulatory Visit: Payer: PPO | Admitting: Occupational Therapy

## 2020-08-28 ENCOUNTER — Other Ambulatory Visit: Payer: Self-pay

## 2020-08-28 ENCOUNTER — Ambulatory Visit: Payer: PPO

## 2020-08-28 ENCOUNTER — Encounter: Payer: Self-pay | Admitting: Physical Therapy

## 2020-08-28 ENCOUNTER — Ambulatory Visit: Payer: PPO | Admitting: Physical Therapy

## 2020-08-28 DIAGNOSIS — R4701 Aphasia: Secondary | ICD-10-CM

## 2020-08-28 DIAGNOSIS — R41844 Frontal lobe and executive function deficit: Secondary | ICD-10-CM

## 2020-08-28 DIAGNOSIS — I69351 Hemiplegia and hemiparesis following cerebral infarction affecting right dominant side: Secondary | ICD-10-CM

## 2020-08-28 DIAGNOSIS — R2681 Unsteadiness on feet: Secondary | ICD-10-CM

## 2020-08-28 DIAGNOSIS — R208 Other disturbances of skin sensation: Secondary | ICD-10-CM

## 2020-08-28 DIAGNOSIS — R471 Dysarthria and anarthria: Secondary | ICD-10-CM

## 2020-08-28 DIAGNOSIS — R278 Other lack of coordination: Secondary | ICD-10-CM

## 2020-08-28 DIAGNOSIS — R41840 Attention and concentration deficit: Secondary | ICD-10-CM

## 2020-08-28 NOTE — Patient Instructions (Addendum)
Access Code: [email or web address] URL: [email or web address] Date: 08/28/2020 Prepared by: Mady Haagensen  Exercises Supine Hip Adduction Isometric with Diona Foley - 1 x daily - 5 x weekly - 1 sets - 10 reps - 5 hold Supine Bridge - 1 x daily - 5 x weekly - 1 sets - 10 reps Supine Heel Slide - 1 x daily - 5 x weekly - 1 sets - 10 reps Bent Knee Fallouts - 1 x daily - 5 x weekly - 1 sets - 10 reps  Added 08/28/2020: Seated Hamstring stretch - 1 x daily - 7 x weekly - 1 sets - 3 reps - 30 sec hold Seated Ankle Dorsiflexion Stretch - 1 x daily - 7 x weekly - 1 sets - 5 reps - 10-15 sec hold

## 2020-08-28 NOTE — Therapy (Signed)
[facility] [address] [place], [name], [address] Phone: [phone]   Fax:  [phone]  Occupational Therapy Treatment  Patient Details  Name: Gerald [name] MRN: [id number] Date of Birth: May 23, 1961 Referring Provider (OT): Frann Rider , NP   Encounter Date: 08/28/2020   OT End of Session - 08/28/20 1052    Visit Number 6    Number of Visits 25    Date for OT Re-Evaluation 10/23/20    Authorization Type [employer]    Authorization Time Period 90 day anticpate -d/c after 8 weeks    Authorization - Visit Number 6    Authorization - Number of Visits 10    OT Start Time 1021    OT Stop Time 1100    OT Time Calculation (min) 39 min    Activity Tolerance Patient tolerated treatment well    Behavior During Therapy [facility] for tasks assessed/performed           Past Medical History:  Diagnosis Date  . Alcohol abuse   . Cocaine abuse ([place]) 2014  . Hypertension   . Seizures ([place]) 07/2015   had first seizures about 6 months after stroke  . Stroke [facility]) 2014   denies residual on 07/03/2015  . Stroke ([place]) 07/02/2015   "now weak on right side; speech problems" (07/03/2015)  . Tobacco use     Past Surgical History:  Procedure Laterality Date  . COLONOSCOPY WITH PROPOFOL N/A 03/02/2019   Procedure: COLONOSCOPY WITH PROPOFOL;  Surgeon: Carol Ada, MD;  [facility];  Service: Endoscopy;  Laterality: N/A;  . ESOPHAGOGASTRODUODENOSCOPY (EGD) WITH PROPOFOL N/A 03/23/2019   Procedure: ESOPHAGOGASTRODUODENOSCOPY (EGD) WITH PROPOFOL;  Surgeon: Carol Ada, MD;  [facility];  Service: Endoscopy;  Laterality: N/A;  . POLYPECTOMY  03/02/2019   Procedure: POLYPECTOMY;  Surgeon: Carol Ada, MD;  [facility];  Service: Endoscopy;;  . Azzie Almas DILATION N/A 03/23/2019   Procedure: Azzie Almas DILATION;  Surgeon: Carol Ada, MD;  [facility];  Service: Endoscopy;  Laterality:  N/A;  . SKIN GRAFT Bilateral 1980s   "got burned by some hot water"    There were no vitals filed for this visit.   Subjective Assessment - 08/28/20 1047    Subjective  reports leg pain    Pertinent History Gerald [name] is a 60 y.o. male with history of ischemic stroke in 08/2015 resulting in right hemiparesis, dysarthria and seizure disorder.  PMH: alcohol abuse,HTN, seizure, CVA. Pt is using bacholphen fo spasticity, pr previously received botox, however spasticity is worse now that he is not receiving botox.    Patient Stated Goals to get better    Currently in Pain? No/denies    Pain Onset More than a month ago               Treatment: Pt able to lock brake on R side of w/c with RUE with min difficulty and unlock with min difficulty (cueing to initiate).  Low range shoulder flexion with tableslides then cane along legs to reach towards floor, chest press at navel height, min v.c facilitation.  Flipping cards with min-mod difficulty releasing cards with incr time and min v.c./prompts and occasional min facilitation.  Arm bike x6 min level 1 for reciprocal movement, min cueing initially only.  Picking up coins and placing in coin bank x41 with min cueing/difficulty releasing.  Fastening buttons with bilateral UE's mod difficulty/ v.c  OT Short Term Goals - 07/31/20 1707      OT SHORT TERM GOAL #1   Title Pt will perform initial HEP with min cueing. 08/29/20    Time 4    Period Weeks    Status New    Target Date 08/29/20      OT SHORT TERM GOAL #2   Title Pt will use RUE as a gross assist/stabilizer at least 25% of the time.    Baseline uses 5%    Time 4    Period Weeks    Status New      OT SHORT TERM GOAL #3   Title Pt will demo at least 55 * shoulder flex for functional reach    Baseline 45    Time 4    Period Weeks    Status New      OT SHORT TERM GOAL #4   Title Pt will demonstrate ability to grasp and relase a 1 inch  block into container 3/5 trials    Time 4    Period Weeks    Status New             OT Long Term Goals - 08/01/20 0910      OT LONG TERM GOAL #1   Title Pt/caregiver will be independent with updated HEP.-10/23/20    Time 12    Status New      OT LONG TERM GOAL #2   Title Pt will demo at least 65* shoulder flex for functional reach .    Time 12    Period Weeks    Status New      OT LONG TERM GOAL #3   Title Pt will use RUE as a gross A for ADLS at least 57% of the time.    Time 12    Period Weeks    Status New      OT LONG TERM GOAL #4   Title Pt will demonstrate A/ROM  elbow extension -50 for increased functional reach.    Baseline -60    Time 12    Period Weeks    Status New      OT LONG TERM GOAL #5   Title Pt will be able to consistently release object in R hand 4/5 trials to place in container    Baseline unable to relase for box/ blocks    Time 12    Period Weeks    Status New      Long Term Additional Goals   Additional Long Term Goals Yes      OT LONG TERM GOAL #6   Title Pt will perform toilet transfers with min A    Time 12    Period Weeks    Status New                  Patient will benefit from skilled therapeutic intervention in order to improve the following deficits and impairments:           Visit Diagnosis: Spastic hemiplegia of right dominant side as late effect of cerebral infarction ([facility])  Other disturbances of skin sensation  Other lack of coordination  Attention and concentration deficit  Frontal lobe and executive function deficit    Problem List Patient Active Problem List   Diagnosis Date Noted  . History of stroke 06/09/2017  . Seizure ([place]) 05/22/2016  . Anxiety state 04/20/2016  . Spastic hemiplegia affecting dominant side ([place]) 09/11/2015  . Infection of wound due to  methicillin resistant Staphylococcus aureus (MRSA)   . 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 Thrombotic stroke involving  left anterior cerebral artery ([place]) 07/08/2015  . Right hemiparesis ([place]) 07/08/2015  . Acute left ACA ischemic stroke ([place]) 07/08/2015  . Cerebrovascular accident (CVA) ([place])   . Stroke ([place]) 07/02/2015  . Acute ischemic stroke ([place]) 07/02/2015  . CVA (cerebral infarction) 01/15/2013  . Essential hypertension 01/15/2013  . Nicotine dependence 01/15/2013  . Alcohol abuse 01/15/2013    Gerald [name] 08/28/2020, 10:54 AM  [facility] [address] [place], [name], [address] Phone: [phone]   Fax:  [phone]  Name: Gerald [name] MRN: [id number] Date of Birth: 04-08-1961

## 2020-08-28 NOTE — Patient Instructions (Addendum)
Purpose: To improve lip and tongue strength in order to help people understand you. These exercises can be done while looking in a mirror.  Do them twice a day.  Lips  1. Press hard and briefly hold all the beginning sounds in these words: Repeat each set 2 times     "ma ma ma"    "boo boo boo"  "pa pa pa."          "moo moo moo" "bye bye bye"  "pie pie pie"          "me me me"  "bee bee bee"  "pea pea pea"   2. Pucker your lips tightly and say "OOOO," then smile wide and say "EEEE."  Repeat _2_ times.  3. Practice whistling for __30__ seconds.  4. "Blow out" candles.  Repeat _10___ times.  5. Blow kisses - make them LOUD!  Repeat __10__ times.   Tip of Tongue  1. Say the sound  "ta ta ta," "la la la,"          and "da da da."  Repeat __2__ times.               "tee tee tee" "lee lee lee"   "dee dee dee"        "too too too" "loo loo loo"  "do do do"  2. Say "time," "took," "take," "town," and "Tom."  Repeat __2__ times.  3. Say "long," "look," "low," "lay" and "lie."  Repeat _2__ times.  4. Say "name," "neck," "not," "new," and "no."  Repeat __2_ times.  5. Say "down," "dip," "dot," "Don," and "do."  Repeat __2__ times   Back of Tongue  1. Say the sounds "ka ka ka" and "go go go."   Repeat ___2_ times.       "cow cow cow" "guy guy guy"       "koo koo koo" "goo goo goo"  2. Say "kick," "cake," "Kate," "key," "keep," "kite," and "Ken."  Repeat __2__ times.  3. Say "gate," "gag," "got," "get," "gone," and "go."  Repeat __2__ times.  

## 2020-08-28 NOTE — Therapy (Signed)
[facility] [address] [place], [name], [address] Phone: [phone]   Fax:  [phone]  Speech Language Pathology Treatment  Patient Details  Name: Gerald [name] MRN: [id number] Date of Birth: Aug 31, 1960 Referring Provider (SLP): Frann Rider NP   Encounter Date: 08/28/2020   End of Session - 08/28/20 1331    Visit Number 3    Number of Visits 17    Date for SLP Re-Evaluation 11/20/20    SLP Start Time 1601    SLP Stop Time  0932    SLP Time Calculation (min) 49 min    Activity Tolerance Patient tolerated treatment well           Past Medical History:  Diagnosis Date  . Alcohol abuse   . Cocaine abuse ([place]) 2014  . Hypertension   . Seizures ([place]) 07/2015   had first seizures about 6 months after stroke  . Stroke [facility]) 2014   denies residual on 07/03/2015  . Stroke ([place]) 07/02/2015   "now weak on right side; speech problems" (07/03/2015)  . Tobacco use     Past Surgical History:  Procedure Laterality Date  . COLONOSCOPY WITH PROPOFOL N/A 03/02/2019   Procedure: COLONOSCOPY WITH PROPOFOL;  Surgeon: Carol Ada, MD;  Location: [facility];  Service: Endoscopy;  Laterality: N/A;  . ESOPHAGOGASTRODUODENOSCOPY (EGD) WITH PROPOFOL N/A 03/23/2019   Procedure: ESOPHAGOGASTRODUODENOSCOPY (EGD) WITH PROPOFOL;  Surgeon: Carol Ada, MD;  [facility];  Service: Endoscopy;  Laterality: N/A;  . POLYPECTOMY  03/02/2019   Procedure: POLYPECTOMY;  Surgeon: Carol Ada, MD;  [facility]: [facility];  Service: Endoscopy;;  . Azzie Almas DILATION N/A 03/23/2019   Procedure: Azzie Almas DILATION;  Surgeon: Carol Ada, MD;  [facility];  Service: Endoscopy;  Laterality: N/A;  . SKIN GRAFT Bilateral 1980s   "got burned by some hot water"    There were no vitals filed for this visit.   Subjective Assessment - 08/28/20 1148    Subjective "fine" re: [place]    Patient is accompained by: Family member     Currently in Pain? No/denies                 ADULT SLP TREATMENT - 08/28/20 1148      General Information   Behavior/Cognition Alert;Cooperative;Pleasant mood      Treatment Provided   Treatment provided Cognitive-Linquistic      Cognitive-Linquistic Treatment   Treatment focused on Aphasia;Dysarthria;Patient/family/caregiver education    Skilled Treatment Pt reported he completed HEP as instructed. SLP reviewed targeted sounds/words for articulation precision, with mod A to demonstrate correct lingual/labial placement, over-ennunicate, and slow rate. SLP targeted sentence completion, which pt able to name with 100% accuracy and speech error x1. SLP targeted pt use of description strategy, which pt able to utilize with 60% accuracy. Accuracy improved with cued slow rate and semantic cues to provide specific relevant information. Pt only said "I don't know" x1 this session, which is significant improvement compared to last session of 11.      Assessment / Recommendations / Plan   Plan Continue with current plan of care      Progression Toward Goals   Progression toward goals Progressing toward goals            SLP Education - 08/28/20 1330    Education Details slow rate, articulation precision, functional phases, description strategy    Person(s) Educated Spouse;Patient    Methods Explanation;Demonstration;Handout    Comprehension Verbalized understanding;Returned demonstration;Need further instruction  SLP Short Term Goals - 08/28/20 1335      SLP SHORT TERM GOAL #1   Title Pt will complete HEP with min A over 3 sessions    Time 4    Period Weeks    Status On-going      SLP SHORT TERM GOAL #2   Title Pt will verbally generate 10 items in personally relevant categories with min A over 3 sessions    Time 4    Period Weeks    Status On-going      SLP SHORT TERM GOAL #3   Title Pt will produce sentence responses with compensations for 80% intelligibilty  with occassional min A over 3 sessions    Time 4    Period Weeks    Status On-going      SLP SHORT TERM GOAL #4   Title Pt will use word finding compensations in simple 5 minute conversation with min A over 3 sessions    Time 4    Period Weeks    Status On-going      SLP SHORT TERM GOAL #5   Title Pt will use mulitmodal communication (gesture, draw, write 1st letter etc) to augment verbal expression with min A over 3 sessions    Time 4    Period Weeks    Status On-going      SLP SHORT TERM GOAL #6   Title Pt's caregiver will complete qualitative communication rating scale in first 2 sessions    Baseline CES: 16 & Comm SF: 13    Status Achieved            SLP Long Term Goals - 08/26/20 1156      SLP LONG TERM GOAL #1   Title Pt will be 90% intelligible in 10 simple conversation with min A over 2 sessions    Time 8    Period Weeks    Status On-going      SLP LONG TERM GOAL #2   Title Pt will appropriately compensate for word finding versus interjecting "I don't know" for 8/10 opportunities across 3 sessions    Time 8    Period Weeks    Status On-going      SLP LONG TERM GOAL #3   Title Pt will use mulitmodal communication (gesture, draw, write 1st letter etc) to augment verbal expression to meet needs at home with rare min A from family.    Time 8    Period Weeks    Status On-going      SLP LONG TERM GOAL #4   Title Caregiver will report improvements in communication effectiveness via QOL scale with > 3 point increase    Time 8    Period Weeks    Status On-going            Plan - 08/28/20 1331    Clinical Impression Statement [name] presents with moderate aphasia and dysarthria. [name] frequently stated "I don't know" in response to conversation in previous ST sessions, in which SLP targeted awareness and kept tally of occurances with only 1 episode noted this session. SLP educated pt on slow rate and over-ennunication to improve speech clarity, in which pt able to  demo with min to mod A at word and phrase level. Pt able to utilize description strategy with min to mod A for semantic cues to specifiy and cued slow rate to reduce artic errors. Given presenation of mild to moderate aphasia, dysarthria, and possible cognitive changes, pt would benefit  from skilled ST intervention to increase effectiveness of communication, reduce caregiver burden, and improve QOL.    Speech Therapy Frequency 2x / week    Duration 8 weeks   or 17 total visits   Treatment/Interventions Compensatory strategies;Functional tasks;Patient/family education;Cueing hierarchy;Cognitive reorganization;Multimodal communcation approach;Other (comment);Compensatory techniques;Internal/external aids;SLP instruction and feedback;Language facilitation    Potential to Achieve Goals Fair    Potential Considerations Previous level of function;Severity of impairments    SLP Home Exercise Plan provided    Consulted and Agree with Plan of Care Patient;Family member/caregiver    Family Member [employer]           Patient will benefit from skilled therapeutic intervention in order to improve the following deficits and impairments:   Aphasia  Dysarthria and anarthria    Problem List Patient Active Problem List   Diagnosis Date Noted  . History of stroke 06/09/2017  . Seizure ([place]) 05/22/2016  . Anxiety state 04/20/2016  . Spastic hemiplegia affecting dominant side ([place]) 09/11/2015  . Infection of wound due to methicillin resistant Staphylococcus aureus (MRSA)   . 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 Right hemiparesis ([place]) 07/08/2015  . Acute left ACA ischemic stroke ([place]) 07/08/2015  . Cerebrovascular accident (CVA) ([place])   . Stroke ([place]) 07/02/2015  . Acute ischemic stroke ([place]) 07/02/2015  . CVA (cerebral infarction) 01/15/2013  . Essential hypertension 01/15/2013  . Nicotine  dependence 01/15/2013  . Alcohol abuse 01/15/2013    Alinda Deem, MA CCC-SLP 08/28/2020, 1:37 PM  [facility] [address] [place], [name], [address] Phone: [phone]   Fax:  [phone]   Name: ABDINASIR SPADAFORE MRN: [id number] Date of Birth: July 02, 1960

## 2020-08-28 NOTE — Therapy (Signed)
[name] [address] [place], [name], [address] Phone: [phone]   Fax:  [phone]  Physical Therapy Treatment  Patient Details  Name: Gerald [name] MRN: [id number] Date of Birth: May 08, 1961 Referring Provider (PT): Frann Rider, NP   Encounter Date: 08/28/2020   PT End of Session - 08/28/20 1108    Visit Number 8    Number of Visits 17    Date for PT Re-Evaluation 09/29/20    Authorization Type [employer] co-pay if visits on same day.    Progress Note Due on Visit 10    PT Start Time 1106    PT Stop Time 1145    PT Time Calculation (min) 39 min    Equipment Utilized During Treatment Gait belt;Other (comment)   Pt's R AFO; wife has helped him don it prior to session   Activity Tolerance Patient tolerated treatment well    Behavior During Therapy [facility] for tasks assessed/performed           Past Medical History:  Diagnosis Date  . Alcohol abuse   . Cocaine abuse ([place]) 2014  . Hypertension   . Seizures ([place]) 07/2015   had first seizures about 6 months after stroke  . Stroke [facility]) 2014   denies residual on 07/03/2015  . Stroke ([place]) 07/02/2015   "now weak on right side; speech problems" (07/03/2015)  . Tobacco use     Past Surgical History:  Procedure Laterality Date  . COLONOSCOPY WITH PROPOFOL N/A 03/02/2019   Procedure: COLONOSCOPY WITH PROPOFOL;  Surgeon: Carol Ada, MD;  [facility];  Service: Endoscopy;  Laterality: N/A;  . ESOPHAGOGASTRODUODENOSCOPY (EGD) WITH PROPOFOL N/A 03/23/2019   Procedure: ESOPHAGOGASTRODUODENOSCOPY (EGD) WITH PROPOFOL;  Surgeon: Carol Ada, MD;  [facility];  Service: Endoscopy;  Laterality: N/A;  . POLYPECTOMY  03/02/2019   Procedure: POLYPECTOMY;  Surgeon: Carol Ada, MD;  [facility];  Service: Endoscopy;;  . Azzie Almas DILATION N/A 03/23/2019   Procedure: Azzie Almas DILATION;  Surgeon: Carol Ada, MD;  [facility];   Service: Endoscopy;  Laterality: N/A;  . SKIN GRAFT Bilateral 1980s   "got burned by some hot water"    There were no vitals filed for this visit.   Subjective Assessment - 08/28/20 1107    Subjective No changes, no more episodes of being on the floor at home.    Patient is accompained by: Family member   [name]   Pertinent History Hx of CVA in 2014 and 2017, has not amb. since OPPT rehab in 2018, seizures, HTN, hx of ETOH and cocaine abuse, HLD    Limitations Walking;Standing    How long can you sit comfortably? unlimited    How long can you stand comfortably? 1 min    How long can you walk comfortably? n/a    Patient Stated Goals Be better at everything: txfs, amb, go to the bathroom.    Currently in Pain? No/denies    Pain Onset More than a month ago                             [facility] Adult PT Treatment/Exercise - 08/28/20 0001      Transfers   Transfers Squat Pivot Transfers;Sit to Stand;Stand to Sit    Sit to Stand 4: Min assist;With upper extremity assist;From chair/3-in-1   Performed x 6 reps from w/c, from mat, to stand with PT assist; min assist x  4, mod assist x 2 reps.  Cues provided for technique   Sit to Stand Details Verbal cues for sequencing;Verbal cues for technique;Verbal cues for safe use of DME/AE;Manual facilitation for weight shifting;Manual facilitation for placement    Sit to Stand Details (indicate cue type and reason) Cues for forward lean>upright stand, use of visual target and cues for squeeze through buttocks to stand taller.    Stand to Sit 4: Min assist;3: Mod assist;With upper extremity assist;To chair/3-in-1;To bed    Stand to Sit Details (indicate cue type and reason) Tactile cues for sequencing;Tactile cues for weight shifting;Tactile cues for placement;Verbal cues for sequencing;Verbal cues for technique;Verbal cues for precautions/safety    Stand to Sit Details Cues to slow control into sitting.  Pt able to do this with  consistent cues.    Squat Pivot Transfers 3: Mod assist;With upper extremity assistance;4: Min Financial risk analyst Details (indicate cue type and reason) w/c<>mat, at least 3 reps each direction, then with wife demo how she does at home.  (Wife stays on pt's R side due to her RUE limitations)  Cues for forward lean, hand placement, and to try to lead towards stronger side with transfers.    Comments standing with therapist assist at edge of mat, x 2 reps 30 sec      Exercises   Exercises Other Exercises    Other Exercises  Seated heel slides, knee flexion/extension x 8 reps with assistance, with increased hold time at end range and assist to push heel down for gastroc stretch.  Seated lateral trunk motions with PT assist to increase weigthshift to R side.      Knee/Hip Exercises: Stretches   Active Hamstring Stretch Right;3 reps;30 seconds    Active Hamstring Stretch Limitations foot propped on elevated stool (has ottoman to use at home)                  PT Education - 08/28/20 1450    Education Details Addition to HEP, progress towards goals; continued education to pt/wife on optimal transfer technique (considering wife's LUE limitations)    Person(s) Educated Patient;Spouse    Methods Explanation;Demonstration;Verbal cues;Handout    Comprehension Verbalized understanding;Returned demonstration;Verbal cues required;Tactile cues required;Need further instruction            PT Short Term Goals - 08/28/20 1452      PT SHORT TERM GOAL #1   Title Pt will perform HEP with min A to improve strength, balance, flexibility. TARGET DATE FOR ALL STGS:08/28/20    Baseline inconsistently performing HEP, per report 08/28/2020    Time 4    Period Weeks    Status Not Met      PT SHORT TERM GOAL #2   Title Pt will perform w/c<>mat squat pivot txfs with min A to improve safety during funcitonal mobility at home.    Baseline Mod-max A; at least mod assist; at times min assist with  consistent cues and repetition    Time 4    Period Weeks    Status Not Met      PT SHORT TERM GOAL #3   Title Trial amb. when appropriate and write goals prn.    Baseline Pt with decreased activation of RLE musculature; have not tried ambulation at this time    Time 4    Period Weeks    Status Deferred      PT SHORT TERM GOAL #4   Title Pt will be able to  stand with UE support for 30 seconds and min guard in order to perform ADLs.    Baseline 30 sec - up to 2 minutes at counter    Time 4    Period Weeks    Status Achieved             PT Long Term Goals - 07/31/20 1043      PT LONG TERM GOAL #1   Title Pt will perform updated HEP with family supervision for improved strength, balance and gait. TARGET DATE FOR ALL LTGS:    Time 8    Period Weeks    Status New      PT LONG TERM GOAL #2   Title Pt will perform stand pivot w/c<>mat txfs with min guard to improve safety during functional mobility.    Time 8    Period Weeks    Status New      PT LONG TERM GOAL #3   Title Pt will perform sit<>stand txfs with min guard x5 reps to improve safety at home.    Time 8    Period Weeks    Status New      PT LONG TERM GOAL #4   Title Pt will stand at sink for 60 seconds with S in order to perform ADLs.    Time 8    Period Weeks    Status New                 Plan - 08/28/20 1454    Clinical Impression Statement Assessed pt's STGs this visit, with pt meeting 1 of 4 STGs.  STG 1 not met, as HEP is inconsistently performed, per wife report.  STG 2 not met; STG 3 deferred (ambulation not appropriate at this time, due to significant RLE weakness, abnormal tone); STG 4 met for standing tolerance.  Pt improves with repetition in session with extensive, consistent cues to slow pace and follow technique through transfer sequence.  However, without consistent cueing, transfers can be impulsive, requireing additional assistance for therapist/caregiver.  Pt has been able to increase  standing tolerance at counter to up to 2 minutes.  Today in therapy session, pt progressed to standing with therapist assist only x 30 sec,2 reps.  He is motivated to continue to work towards therapy goals and would bneeift from continued skilled PT to further work on improved transfers, RLE functional strength and overall standing ability to decrease caregiver burden.    Personal Factors and Comorbidities Comorbidity 3+;Time since onset of injury/illness/exacerbation;Social Background    Comorbidities Hx of CVA in 2014 and 2017, has not amb. since OPPT rehab in 2018, seizures, HTN, hx of ETOH and cocaine abuse, HLD    Examination-Activity Limitations Bed Mobility;Bathing;Bend;Carry;Dressing;Hygiene/Grooming;Lift;Stand;Toileting;Squat;Sleep;Self Feeding;Locomotion Level;Transfers;Reach Overhead    Examination-Participation Restrictions Meal Prep;Driving;Interpersonal Relationship;Laundry;Medication Management;Community Activity    Stability/Clinical Decision Making Stable/Uncomplicated    Rehab Potential Fair    PT Frequency 2x / week    PT Duration 8 weeks    PT Treatment/Interventions ADLs/Self Care Home Management;Electrical Stimulation;DME Instruction;Gait training;Functional mobility training;Therapeutic activities;Therapeutic exercise;Balance training;Neuromuscular re-education;Manual techniques;Wheelchair mobility training;Orthotic Fit/Training;Patient/family education;Cognitive remediation;Vestibular    PT Next Visit Plan Continue to work on transfers, sit<>stand in [place] or in parallel bars.  Letta Moynahan) discussed power w/c as an option but wasn't able to trial due to not being able to move joystick over (screw was stripped); WB through RLE, timing and safety with sit > stand and transfers.  Review additions to HEP    PT Home  Exercise Plan Access Code: [email or web address]    Recommended Other Services w/c assessment with [employer] 09/16/20 visit    Consulted and Agree with Plan of Care Patient;Family  member/caregiver    Family Member Consulted Gerald [name]: wife           Patient will benefit from skilled therapeutic intervention in order to improve the following deficits and impairments:  Difficulty walking,Decreased coordination  Visit Diagnosis: Unsteadiness on feet  Spastic hemiplegia of right dominant side as late effect of cerebral infarction [facility])     Problem List Patient Active Problem List   Diagnosis Date Noted  . History of stroke 06/09/2017  . Seizure ([place]) 05/22/2016  . Anxiety state 04/20/2016  . Spastic hemiplegia affecting dominant side ([place]) 09/11/2015  . Infection of wound due to methicillin resistant Staphylococcus aureus (MRSA)   . 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 Right hemiparesis ([place]) 07/08/2015  . Acute left ACA ischemic stroke ([place]) 07/08/2015  . Cerebrovascular accident (CVA) ([place])   . Stroke ([place]) 07/02/2015  . Acute ischemic stroke ([place]) 07/02/2015  . CVA (cerebral infarction) 01/15/2013  . Essential hypertension 01/15/2013  . Nicotine dependence 01/15/2013  . Alcohol abuse 01/15/2013    Juleen Sorrels W. 08/28/2020, 2:59 PM  Frazier Butt., PT   [facility] [address] [place], [name], [address] Phone: [phone]   Fax:  [phone]  Name: JERIME ARIF MRN: [id number] Date of Birth: May 09, 1961

## 2020-09-02 ENCOUNTER — Ambulatory Visit: Payer: PPO

## 2020-09-02 ENCOUNTER — Encounter: Payer: Self-pay | Admitting: Occupational Therapy

## 2020-09-02 ENCOUNTER — Ambulatory Visit: Payer: PPO | Admitting: Occupational Therapy

## 2020-09-02 ENCOUNTER — Other Ambulatory Visit: Payer: Self-pay

## 2020-09-02 ENCOUNTER — Ambulatory Visit: Payer: PPO | Admitting: Physical Therapy

## 2020-09-02 ENCOUNTER — Encounter: Payer: Self-pay | Admitting: Physical Therapy

## 2020-09-02 DIAGNOSIS — R41840 Attention and concentration deficit: Secondary | ICD-10-CM

## 2020-09-02 DIAGNOSIS — I69351 Hemiplegia and hemiparesis following cerebral infarction affecting right dominant side: Secondary | ICD-10-CM

## 2020-09-02 DIAGNOSIS — R41844 Frontal lobe and executive function deficit: Secondary | ICD-10-CM

## 2020-09-02 DIAGNOSIS — R278 Other lack of coordination: Secondary | ICD-10-CM

## 2020-09-02 DIAGNOSIS — R471 Dysarthria and anarthria: Secondary | ICD-10-CM

## 2020-09-02 DIAGNOSIS — R4701 Aphasia: Secondary | ICD-10-CM

## 2020-09-02 DIAGNOSIS — R2689 Other abnormalities of gait and mobility: Secondary | ICD-10-CM

## 2020-09-02 DIAGNOSIS — R208 Other disturbances of skin sensation: Secondary | ICD-10-CM
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
## 2020-09-02 NOTE — Therapy (Signed)
[facility] [address] [place], [name], [address] Phone: [phone]   Fax:  [phone]  Physical Therapy Treatment  Patient Details  Name: Gerald [name] MRN: [id number] Date of Birth: Dec 26, 1960 Referring Provider (PT): Gerald Rider, NP   Encounter Date: 09/02/2020   PT End of Session - 09/02/20 1232    Visit Number 9    Number of Visits 17    Date for PT Re-Evaluation 09/29/20    Authorization Type [employer] co-pay if visits on same day.    Progress Note Due on Visit 10    PT Start Time 1148    PT Stop Time 1228    PT Time Calculation (min) 40 min    Equipment Utilized During Treatment Gait belt;Other (comment)   Pt's R AFO; wife has helped him don it prior to session   Activity Tolerance Patient tolerated treatment well    Behavior During Therapy [facility] for tasks assessed/performed           Past Medical History:  Diagnosis Date  . Alcohol abuse   . Cocaine abuse ([place]) 2014  . Hypertension   . Seizures ([place]) 07/2015   had first seizures about 6 months after stroke  . Stroke [facility]) 2014   denies residual on 07/03/2015  . Stroke ([place]) 07/02/2015   "now weak on right side; speech problems" (07/03/2015)  . Tobacco use     Past Surgical History:  Procedure Laterality Date  . COLONOSCOPY WITH PROPOFOL N/A 03/02/2019   Procedure: COLONOSCOPY WITH PROPOFOL;  Surgeon: Carol Ada, MD;  Location: [facility];  Service: Endoscopy;  Laterality: N/A;  . ESOPHAGOGASTRODUODENOSCOPY (EGD) WITH PROPOFOL N/A 03/23/2019   Procedure: ESOPHAGOGASTRODUODENOSCOPY (EGD) WITH PROPOFOL;  Surgeon: Carol Ada, MD;  [facility];  Service: Endoscopy;  Laterality: N/A;  . POLYPECTOMY  03/02/2019   Procedure: POLYPECTOMY;  Surgeon: Carol Ada, MD;  [facility];  Service: Endoscopy;;  . Azzie Almas DILATION N/A 03/23/2019   Procedure: Azzie Almas DILATION;  Surgeon: Carol Ada, MD;  [facility];   Service: Endoscopy;  Laterality: N/A;  . SKIN GRAFT Bilateral 1980s   "got burned by some hot water"    There were no vitals filed for this visit.   Subjective Assessment - 09/02/20 1150    Subjective Reports transfers have been going well at home.    Patient is accompained by: Family member   [name]   Pertinent History Hx of CVA in 2014 and 2017, has not amb. since OPPT rehab in 2018, seizures, HTN, hx of ETOH and cocaine abuse, HLD    Limitations Walking;Standing    How long can you sit comfortably? unlimited    How long can you stand comfortably? 1 min    How long can you walk comfortably? n/a    Patient Stated Goals Be better at everything: txfs, amb, go to the bathroom.    Currently in Pain? No/denies    Pain Onset More than a month ago                             [facility] Adult PT Treatment/Exercise - 09/02/20 1215      Transfers   Transfers Squat Pivot Transfers;Sit to Stand;Stand to Sit    Sit to Stand With upper extremity assist;From chair/3-in-1;3: Mod assist;4: Min assist    Sit to Stand Details Verbal cues for sequencing;Verbal cues for technique;Verbal cues for safe use of DME/AE;Manual  facilitation for weight shifting;Manual facilitation for placement    Sit to Stand Details (indicate cue type and reason) Using STEDY, pt performs sit<>stand with hands on bars to assist to pull up to stand. needs assist for proper positioning prior to transfer (such as scooting forwards). Pt performs sit<>stand  x 5 reps from upright sit position in [place], 2 sets.  Tactile cues for increased forward lean and weightshift through RLE while performing    Stand to Sit 4: Min assist;3: Mod assist;With upper extremity assist;To chair/3-in-1;To bed   to [facility] elevated seat   Stand to Sit Details (indicate cue type and reason) Tactile cues for sequencing;Tactile cues for weight shifting;Tactile cues for placement;Verbal cues for sequencing;Verbal cues for technique;Verbal cues  for precautions/safety    Stand to Sit Details cues for controlled descent    Squat Pivot Transfers 3: Mod assist;With upper extremity assistance    Squat Pivot Transfer Details (indicate cue type and reason) w/c <> mat table, cues for forwrad lean and head hips/relationship, proper UE placement, PT tech also helping to steady w/c during transfer    Comments in [place]: working on standing tolerance, performing partial sit <> stand and standing for 45 seconds at a time x3 reps, use of mirror for visual cue for weight shifting towards R, cues for B knee extension and tall postue      Exercises   Exercises Other Exercises    Other Exercises  Seated heel slides, knee flexion/extension x 10 reps with assistance and use of pillow case to decr friction, with increased hold time at end range and assist to push heel down for gastroc stretch. seated hip ADD with pillow squeeze x10 reps, therapist holding pillow to prevent it from dropping      Knee/Hip Exercises: Stretches   Active Hamstring Stretch Right;30 seconds;4 reps    Active Hamstring Stretch Limitations reviewed from HEP (pt has not performed at home) originally performed with foot propped on stool to mimic ottomon, pt reporting stretch felt uncomfortable, instead performed on 6" step with pt reporting stretch felt better, discussed potentially using a step stool for home                    PT Short Term Goals - 08/28/20 1452      PT SHORT TERM GOAL #1   Title Pt will perform HEP with min A to improve strength, balance, flexibility. TARGET DATE FOR ALL STGS:08/28/20    Baseline inconsistently performing HEP, per report 08/28/2020    Time 4    Period Weeks    Status Not Met      PT SHORT TERM GOAL #2   Title Pt will perform w/c<>mat squat pivot txfs with min A to improve safety during funcitonal mobility at home.    Baseline Mod-max A; at least mod assist; at times min assist with consistent cues and repetition    Time 4    Period  Weeks    Status Not Met      PT SHORT TERM GOAL #3   Title Trial amb. when appropriate and write goals prn.    Baseline Pt with decreased activation of RLE musculature; have not tried ambulation at this time    Time 4    Period Weeks    Status Deferred      PT SHORT TERM GOAL #4   Title Pt will be able to stand with UE support for 30 seconds and min guard in order to perform ADLs.  Baseline 30 sec - up to 2 minutes at counter    Time 4    Period Weeks    Status Achieved             PT Long Term Goals - 07/31/20 1043      PT LONG TERM GOAL #1   Title Pt will perform updated HEP with family supervision for improved strength, balance and gait. TARGET DATE FOR ALL LTGS:    Time 8    Period Weeks    Status New      PT LONG TERM GOAL #2   Title Pt will perform stand pivot w/c<>mat txfs with min guard to improve safety during functional mobility.    Time 8    Period Weeks    Status New      PT LONG TERM GOAL #3   Title Pt will perform sit<>stand txfs with min guard x5 reps to improve safety at home.    Time 8    Period Weeks    Status New      PT LONG TERM GOAL #4   Title Pt will stand at sink for 60 seconds with S in order to perform ADLs.    Time 8    Period Weeks    Status New                 Plan - 09/02/20 1425    Clinical Impression Statement Focus of today's skilled session focused on reviewing previous additions to HEP for RLE stretching, standing tolerance in [place], and functional strengthening with sit <> stands in [place]. Utilized Geologist, engineering as Warden/ranger in standing in [place] for cues to weight shift more towards RLE for incr weight bearing. Pt only able to stand in  today for 45 second bouts at a time before fatigue. Will continue to progress towards LTGs.    Personal Factors and Comorbidities Comorbidity 3+;Time since onset of injury/illness/exacerbation;Social Background    Comorbidities Hx of CVA in 2014 and 2017, has not amb. since OPPT  rehab in 2018, seizures, HTN, hx of ETOH and cocaine abuse, HLD    Examination-Activity Limitations Bed Mobility;Bathing;Bend;Carry;Dressing;Hygiene/Grooming;Lift;Stand;Toileting;Squat;Sleep;Self Feeding;Locomotion Level;Transfers;Reach Overhead    Examination-Participation Restrictions Meal Prep;Driving;Interpersonal Relationship;Laundry;Medication Management;Community Activity    Stability/Clinical Decision Making Stable/Uncomplicated    Rehab Potential Fair    PT Frequency 2x / week    PT Duration 8 weeks    PT Treatment/Interventions ADLs/Self Care Home Management;Electrical Stimulation;DME Instruction;Gait training;Functional mobility training;Therapeutic activities;Therapeutic exercise;Balance training;Neuromuscular re-education;Manual techniques;Wheelchair mobility training;Orthotic Fit/Training;Patient/family education;Cognitive remediation;Vestibular    PT Next Visit Plan will need progress report. Continue to work on transfers, sit<>stand in [place] or in parallel bars.  Gerald [name]) discussed power w/c as an option but wasn't able to trial due to not being able to move joystick over (screw was stripped); WB through RLE, timing and safety with sit > stand and transfers.    PT Home Exercise Plan Access Code: [email or web address]    Consulted and Agree with Plan of Care Patient;Family member/caregiver    Family Member Consulted Gerald [name]: wife           Patient will benefit from skilled therapeutic intervention in order to improve the following deficits and impairments:  Difficulty walking,Decreased coordination  Visit Diagnosis: Spastic hemiplegia of right dominant side as late effect of cerebral infarction [facility])  Other lack of coordination  Other abnormalities of gait and mobility     Problem List Patient Active Problem List   Diagnosis Date Noted  . History  of stroke 06/09/2017  . Seizure ([place]) 05/22/2016  . Anxiety state 04/20/2016  . Spastic hemiplegia affecting dominant side ([place])  09/11/2015  . Infection of wound due to methicillin resistant Staphylococcus aureus (MRSA)   . 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 Right hemiparesis ([place]) 07/08/2015  . Acute left ACA ischemic stroke ([place]) 07/08/2015  . Cerebrovascular accident (CVA) ([place])   . Stroke ([place]) 07/02/2015  . Acute ischemic stroke ([place]) 07/02/2015  . CVA (cerebral infarction) 01/15/2013  . Essential hypertension 01/15/2013  . Nicotine dependence 01/15/2013  . Alcohol abuse 01/15/2013    Arliss Journey, PT, DPT  09/02/2020, 2:28 PM  [facility] [address] [place], [name], [address] Phone: [phone]   Fax:  [phone]  Name: Gerald [name] MRN: [id number] Date of Birth: 1960/09/04

## 2020-09-04 ENCOUNTER — Other Ambulatory Visit: Payer: Self-pay

## 2020-09-04 ENCOUNTER — Ambulatory Visit: Payer: PPO | Admitting: Occupational Therapy

## 2020-09-04 ENCOUNTER — Ambulatory Visit: Payer: PPO | Admitting: Physical Therapy

## 2020-09-04 ENCOUNTER — Ambulatory Visit: Payer: PPO

## 2020-09-04 DIAGNOSIS — R2681 Unsteadiness on feet: Secondary | ICD-10-CM

## 2020-09-04 DIAGNOSIS — R278 Other lack of coordination: Secondary | ICD-10-CM

## 2020-09-04 DIAGNOSIS — R41844 Frontal lobe and executive function deficit: Secondary | ICD-10-CM

## 2020-09-04 DIAGNOSIS — R41840 Attention and concentration deficit: Secondary | ICD-10-CM

## 2020-09-04 DIAGNOSIS — R41841 Cognitive communication deficit: Secondary | ICD-10-CM

## 2020-09-04 DIAGNOSIS — I69351 Hemiplegia and hemiparesis following cerebral infarction affecting right dominant side: Secondary | ICD-10-CM | POA: Diagnosis not present

## 2020-09-04 DIAGNOSIS — R2689 Other abnormalities of gait and mobility: Secondary | ICD-10-CM

## 2020-09-04 DIAGNOSIS — R4701 Aphasia: Secondary | ICD-10-CM

## 2020-09-04 DIAGNOSIS — R208 Other disturbances of skin sensation: Secondary | ICD-10-CM

## 2020-09-04 DIAGNOSIS — R471 Dysarthria and anarthria: Secondary | ICD-10-CM

## 2020-09-04 NOTE — Therapy (Signed)
[facility] [address] [place], [name], [address] Phone: [phone]   Fax:  [phone]  Occupational Therapy Treatment  Patient Details  Name: Gerald [name] MRN: [id number] Date of Birth: [birth year] Referring Provider (OT): Frann Rider , NP   Encounter Date: 09/04/2020   OT End of Session - 09/04/20 1155    Visit Number 8    Number of Visits 25    Date for OT Re-Evaluation 10/23/20    Authorization Type [employer]    Authorization Time Period 90 day anticpate -d/c after 8 weeks    Authorization - Visit Number 8    Authorization - Number of Visits 10    OT Start Time 1103    OT Stop Time 1145    OT Time Calculation (min) 42 min           Past Medical History:  Diagnosis Date  . Alcohol abuse   . Cocaine abuse ([place]) 2014  . Hypertension   . Seizures ([place]) 07/2015   had first seizures about 6 months after stroke  . Stroke [facility]) 2014   denies residual on 07/03/2015  . Stroke ([place]) 07/02/2015   "now weak on right side; speech problems" (07/03/2015)  . Tobacco use     Past Surgical History:  Procedure Laterality Date  . COLONOSCOPY WITH PROPOFOL N/A 03/02/2019   Procedure: COLONOSCOPY WITH PROPOFOL;  Surgeon: Carol Ada, MD;  [facility];  Service: Endoscopy;  Laterality: N/A;  . ESOPHAGOGASTRODUODENOSCOPY (EGD) WITH PROPOFOL N/A 03/23/2019   Procedure: ESOPHAGOGASTRODUODENOSCOPY (EGD) WITH PROPOFOL;  Surgeon: Carol Ada, MD;  [facility];  Service: Endoscopy;  Laterality: N/A;  . POLYPECTOMY  03/02/2019   Procedure: POLYPECTOMY;  Surgeon: Carol Ada, MD;  [facility];  Service: Endoscopy;;  . Azzie Almas DILATION N/A 03/23/2019   Procedure: Azzie Almas DILATION;  Surgeon: Carol Ada, MD;  [facility];  Service: Endoscopy;  Laterality: N/A;  . SKIN GRAFT Bilateral 1980s   "got burned by some hot water"    There were no vitals filed for this  visit.   Subjective Assessment - 09/04/20 1257    Subjective  ok    Pertinent History Gerald [name] is a 60 y.o. male with history of ischemic stroke in 08/2015 resulting in right hemiparesis, dysarthria and seizure disorder.  PMH: alcohol abuse,HTN, seizure, CVA. Pt is using bacholphen fo spasticity, pr previously received botox, however spasticity is worse now that he is not receiving botox.    Patient Stated Goals to get better    Currently in Pain? No/denies                Treatment: Tableslides with min-mod facilitation v.c for low range shoulder flexion. Scooping beans holding spoon with RUE, min v.c Placing small pegs in pegboard with RUE for increased fine motor coordination, min-mod difficulty manipulating, min -mod v.c due to apraxia, increased time required. Picking up coins with RUE to place in container improved release. Discussed activities pt can perform at home with RUE with pt/wife.                OT Education - 09/04/20 1255    Education Details discussed functional activities that pt can perform with his R hand    Person(s) Educated Patient;Spouse    Methods Explanation;Demonstration;Tactile cues;Verbal cues    Comprehension Verbalized understanding;Returned demonstration;Verbal cues required;Tactile cues required            OT Short Term Goals - 09/04/20 1227  OT SHORT TERM GOAL #1   Title Pt will perform initial HEP with min cueing. 08/29/20    Time 4    Period Weeks    Status Achieved    Target Date 08/29/20      OT SHORT TERM GOAL #2   Title Pt will use RUE as a gross assist/stabilizer at least 25% of the time.    Baseline uses 5%    Time 4    Period Weeks    Status On-going   10% or less     OT SHORT TERM GOAL #3   Title Pt will demo at least 55 * shoulder flex for functional reach    Baseline 45    Time 4    Period Weeks    Status On-going      OT SHORT TERM GOAL #4   Title Pt will demonstrate ability to grasp and  relase a 1 inch block into container 3/5 trials    Time 4    Period Weeks    Status On-going             OT Long Term Goals - 08/01/20 0910      OT LONG TERM GOAL #1   Title Pt/caregiver will be independent with updated HEP.-10/23/20    Time 12    Status New      OT LONG TERM GOAL #2   Title Pt will demo at least 65* shoulder flex for functional reach .    Time 12    Period Weeks    Status New      OT LONG TERM GOAL #3   Title Pt will use RUE as a gross A for ADLS at least 09% of the time.    Time 12    Period Weeks    Status New      OT LONG TERM GOAL #4   Title Pt will demonstrate A/ROM  elbow extension -50 for increased functional reach.    Baseline -60    Time 12    Period Weeks    Status New      OT LONG TERM GOAL #5   Title Pt will be able to consistently release object in R hand 4/5 trials to place in container    Baseline unable to relase for box/ blocks    Time 12    Period Weeks    Status New      Long Term Additional Goals   Additional Long Term Goals Yes      OT LONG TERM GOAL #6   Title Pt will perform toilet transfers with min A    Time 12    Period Weeks    Status New                 Plan - 09/04/20 1255    Clinical Impression Statement Pt is progressing towards goals with improving RUE functional use. Pt will need assistance to carryover functional use at home, therapist discussed with pt/ wife.    OT Occupational Profile and History Detailed Assessment- Review of Records and additional review of physical, cognitive, psychosocial history related to current functional performance    Occupational performance deficits (Please refer to evaluation for details): ADL's;IADL's;Leisure;Social Participation    Body Structure / Function / Physical Skills ADL;UE functional use;Muscle spasms;Endurance;Balance;Flexibility;Pain;FMC;ROM;Gait;Coordination;GMC;Sensation;Decreased knowledge of precautions;Decreased knowledge of use of  DME;IADL;Dexterity;Strength;Mobility;Tone    Cognitive Skills Attention;Memory;Problem Solve;Safety Awareness;Thought;Understand    Rehab Potential Good    OT Frequency 2x /  week    OT Duration 12 weeks    OT Treatment/Interventions Self-care/ADL training;Ultrasound;Visual/perceptual remediation/compensation;DME and/or AE instruction;[place]ar mobilization;Patient/family education;Balance training;Passive range of motion;Paraffin;Gait Training;Cryotherapy;Fluidtherapy;Splinting;Functional Mobility Training;Moist Heat;Therapeutic exercise;Manual Therapy;Therapeutic activities;Cognitive remediation/compensation;Neuromuscular education    Plan Finish Checking  STGs; continue functional use of RUE, low range shoulder flexion/ functional reach    Consulted and Agree with Plan of Care Patient;Family member/caregiver    Family Member Consulted Wife [name]           Patient will benefit from skilled therapeutic intervention in order to improve the following deficits and impairments:   Body Structure / Function / Physical Skills: ADL,UE functional use,Muscle spasms,Endurance,Balance,Flexibility,Pain,FMC,ROM,Gait,Coordination,GMC,Sensation,Decreased knowledge of precautions,Decreased knowledge of use of DME,IADL,Dexterity,Strength,Mobility,Tone Cognitive Skills: Attention,Memory,Problem Solve,Safety Awareness,Thought,Understand     Visit Diagnosis: Spastic hemiplegia of right dominant side as late effect of cerebral infarction [facility])  Other lack of coordination  Attention and concentration deficit  Frontal lobe and executive function deficit  Other disturbances of skin sensation  Other abnormalities of gait and mobility    Problem List Patient Active Problem List   Diagnosis Date Noted  . History of stroke 06/09/2017  . Seizure ([place]) 05/22/2016  . Anxiety state 04/20/2016  . Spastic hemiplegia affecting dominant side ([place]) 09/11/2015  . Infection of wound due to methicillin resistant  Staphylococcus aureus (MRSA)   . 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 Right hemiparesis ([place]) 07/08/2015  . Acute left ACA ischemic stroke ([place]) 07/08/2015  . Cerebrovascular accident (CVA) ([place])   . Stroke ([place]) 07/02/2015  . Acute ischemic stroke ([place]) 07/02/2015  . CVA (cerebral infarction) 01/15/2013  . Essential hypertension 01/15/2013  . Nicotine dependence 01/15/2013  . Alcohol abuse 01/15/2013    Gerald [name] 09/04/2020, 1:03 PM  [facility] [address] [place], [name], [address] Phone: [phone]   Fax:  [phone]  Name: Gerald [name] MRN: [id number] Date of Birth: 1961/02/16

## 2020-09-04 NOTE — Patient Instructions (Signed)
   Call [facility] 9-1-1 [phone] and let them know you have aphasia and may not be able to communicate easily in an emergency.  Practice your exercises two times a day

## 2020-09-04 NOTE — Patient Instructions (Signed)
Things Slade can use his right hand to do:  Drink with a plastic cup  Scoop beans or grits  Pick up a sandwich or piece of pizza, you can use both hands if you need to  [facility] your chest and left arm   Pull down your shirt

## 2020-09-04 NOTE — Therapy (Signed)
[facility] [address] [place], [name], [address] Phone: [phone]   Fax:  [phone]  Speech Language Pathology Treatment  Patient Details  Name: Gerald [name] MRN: [id number] Date of Birth: Jul 03, 1960 Referring Provider (SLP): Frann Rider NP   Encounter Date: 09/04/2020   End of Session - 09/04/20 1256    Visit Number 5    Number of Visits 17    Date for SLP Re-Evaluation 11/20/20    SLP Start Time 9924    SLP Stop Time  1357    SLP Time Calculation (min) 42 min    Activity Tolerance Patient tolerated treatment well           Past Medical History:  Diagnosis Date  . Alcohol abuse   . Cocaine abuse ([place]) 2014  . Hypertension   . Seizures ([place]) 07/2015   had first seizures about 6 months after stroke  . Stroke [facility]) 2014   denies residual on 07/03/2015  . Stroke ([place]) 07/02/2015   "now weak on right side; speech problems" (07/03/2015)  . Tobacco use     Past Surgical History:  Procedure Laterality Date  . COLONOSCOPY WITH PROPOFOL N/A 03/02/2019   Procedure: COLONOSCOPY WITH PROPOFOL;  Surgeon: Carol Ada, MD;  Location: [facility];  Service: Endoscopy;  Laterality: N/A;  . ESOPHAGOGASTRODUODENOSCOPY (EGD) WITH PROPOFOL N/A 03/23/2019   Procedure: ESOPHAGOGASTRODUODENOSCOPY (EGD) WITH PROPOFOL;  Surgeon: Carol Ada, MD;  [facility];  Service: Endoscopy;  Laterality: N/A;  . POLYPECTOMY  03/02/2019   Procedure: POLYPECTOMY;  Surgeon: Carol Ada, MD;  [facility];  Service: Endoscopy;;  . Azzie Almas DILATION N/A 03/23/2019   Procedure: Azzie Almas DILATION;  Surgeon: Carol Ada, MD;  [facility];  Service: Endoscopy;  Laterality: N/A;  . SKIN GRAFT Bilateral 1980s   "got burned by some hot water"    There were no vitals filed for this visit.   Subjective Assessment - 09/04/20 1316    Subjective "I have been busy"    Patient is accompained by: Family member     Currently in Pain? No/denies                 ADULT SLP TREATMENT - 09/04/20 1315      General Information   Behavior/Cognition Alert;Cooperative;Pleasant mood      Treatment Provided   Treatment provided Cognitive-Linquistic      Cognitive-Linquistic Treatment   Treatment focused on Aphasia;Dysarthria;Patient/family/caregiver education    Skilled Treatment Inconsistent carryover of HEP reported; however, pt's [name] reports improved understanding and stated "I didn't have to translate" re: phone call. SLP targeted generative naming in personally relevant categories, in which pt able to ID 10 items in 3 categories with min A. SLP prompted to generate sentences with targeted words, with min A required to increase MLU and decrease semantic errors (i.e., "hamburger sandwiches"). Of note, pt stated "I don't know" when word finding occured x6 this session. SLP recommended them to contact [facility] in case of emergency given occasional decreased intelligiblity and word finding during phone conversations.      Assessment / Recommendations / Plan   Plan Continue with current plan of care      Progression Toward Goals   Progression toward goals Progressing toward goals            SLP Education - 09/04/20 1358    Education Details compensations, speaking in full sentences, Agency number    [employer]) Educated Patient;Spouse  Methods Explanation;Demonstration;Handout    Comprehension Verbalized understanding;Returned demonstration;Need further instruction            SLP Short Term Goals - 09/04/20 1254      SLP SHORT TERM GOAL #1   Title Pt will complete HEP with min A over 3 sessions    Baseline 09-02-20    Time 3    Period Weeks   or 9 total visits for all STGs   Status On-going      SLP SHORT TERM GOAL #2   Title Pt will verbally generate 10 items in personally relevant categories with min A over 3 sessions    Baseline 09-04-20    Time 3    Period  Weeks    Status On-going      SLP SHORT TERM GOAL #3   Title Pt will produce sentence responses with compensations for 80% intelligibilty with occassional min A over 3 sessions    Baseline 09-02-20, 09-04-20    Time 3    Period Weeks    Status On-going      SLP SHORT TERM GOAL #4   Title Pt will use word finding compensations in simple 5 minute conversation with min A over 3 sessions    Time 3    Period Weeks    Status On-going      SLP SHORT TERM GOAL #5   Title Pt will use mulitmodal communication (gesture, draw, write 1st letter etc) to augment verbal expression with min A over 3 sessions    Time 3    Period Weeks    Status On-going      SLP SHORT TERM GOAL #6   Title Pt's caregiver will complete qualitative communication rating scale in first 2 sessions    Baseline CES: 16 & Comm SF: 13    Status Achieved            SLP Long Term Goals - 09/04/20 1255      SLP LONG TERM GOAL #1   Title Pt will be 90% intelligible in 10 simple conversation with min A over 2 sessions    Time 7    Period Weeks   or 17 total visits for all LTGs   Status On-going      SLP LONG TERM GOAL #2   Title Pt will appropriately compensate for word finding versus interjecting "I don't know" for 8/10 opportunities across 3 sessions    Time 7    Period Weeks    Status On-going      SLP LONG TERM GOAL #3   Title Pt will use mulitmodal communication (gesture, draw, write 1st letter etc) to augment verbal expression to meet needs at home with rare min A from family.    Time 7    Period Weeks    Status On-going      SLP LONG TERM GOAL #4   Title Caregiver will report improvements in communication effectiveness via QOL scale with > 3 point increase    Time 7    Period Weeks    Status On-going            Plan - 09/04/20 1422    Clinical Impression Statement Gerald [name] presents with moderate aphasia and dysarthria. Pt exhibits improved word finding and increased MLU since initiation of ST services.  Pt only stated "I don't know" when word finding occured x6 this session. Pt able to name and generate functional sentences with min A for semantic errors and to expand MLU.  Pt's wife reports improved understanding and less frequent need to "translate" for patient. Given presenation of mild to moderate aphasia, dysarthria, and possible cognitive changes, pt would benefit from skilled ST intervention to increase effectiveness of communication, reduce caregiver burden, and improve QOL.    Speech Therapy Frequency 2x / week    Duration 8 weeks   or 17 total visits   Treatment/Interventions Compensatory strategies;Functional tasks;Patient/family education;Cueing hierarchy;Cognitive reorganization;Multimodal communcation approach;Other (comment);Compensatory techniques;Internal/external aids;SLP instruction and feedback;Language facilitation    Potential to Achieve Goals Good    Potential Considerations Previous level of function;Severity of impairments    SLP Home Exercise Plan provided    Consulted and Agree with Plan of Care Patient;Family member/caregiver           Patient will benefit from skilled therapeutic intervention in order to improve the following deficits and impairments:   Aphasia  Dysarthria and anarthria  Cognitive communication deficit    Problem List Patient Active Problem List   Diagnosis Date Noted  . History of stroke 06/09/2017  . Seizure ([place]) 05/22/2016  . Anxiety state 04/20/2016  . Spastic hemiplegia affecting dominant side ([place]) 09/11/2015  . Infection of wound due to methicillin resistant Staphylococcus aureus (MRSA)   . 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 Right hemiparesis ([place]) 07/08/2015  . Acute left ACA ischemic stroke ([place]) 07/08/2015  . Cerebrovascular accident (CVA) ([place])   . Stroke ([place]) 07/02/2015  . Acute ischemic stroke ([place]) 07/02/2015  . CVA  (cerebral infarction) 01/15/2013  . Essential hypertension 01/15/2013  . Nicotine dependence 01/15/2013  . Alcohol abuse 01/15/2013    Alinda Deem, MA CCC-SLP 09/04/2020, 2:27 PM  [facility] [address] [place], [name], [address] Phone: [phone]   Fax:  [phone]   Name: Gerald [name] MRN: [id number] Date of Birth: 1961-04-10

## 2020-09-04 NOTE — Therapy (Signed)
[facility] [address] [place], [name], [address] Phone: [phone]   Fax:  [phone]  Physical Therapy Treatment  Patient Details  Name: Gerald [name] MRN: [id number] Date of Birth: [birth year] Referring Provider (PT): Frann Rider, NP   Encounter Date: 09/04/2020   PT End of Session - 09/04/20 1243    Visit Number 10    Number of Visits 17    Date for PT Re-Evaluation 09/29/20    Authorization Type [employer] co-pay if visits on same day.    Progress Note Due on Visit 10    PT Start Time 1234    PT Stop Time 1315    PT Time Calculation (min) 41 min    Equipment Utilized During Treatment Gait belt;Other (comment)   Pt's R AFO; wife has helped him don it prior to session   Activity Tolerance Patient tolerated treatment well    Behavior During Therapy [facility] for tasks assessed/performed           Past Medical History:  Diagnosis Date  . Alcohol abuse   . Cocaine abuse ([place]) 2014  . Hypertension   . Seizures ([place]) 07/2015   had first seizures about 6 months after stroke  . Stroke [facility]) 2014   denies residual on 07/03/2015  . Stroke ([place]) 07/02/2015   "now weak on right side; speech problems" (07/03/2015)  . Tobacco use     Past Surgical History:  Procedure Laterality Date  . COLONOSCOPY WITH PROPOFOL N/A 03/02/2019   Procedure: COLONOSCOPY WITH PROPOFOL;  Surgeon: Carol Ada, MD;  [facility];  Service: Endoscopy;  Laterality: N/A;  . ESOPHAGOGASTRODUODENOSCOPY (EGD) WITH PROPOFOL N/A 03/23/2019   Procedure: ESOPHAGOGASTRODUODENOSCOPY (EGD) WITH PROPOFOL;  Surgeon: Carol Ada, MD;  [facility];  Service: Endoscopy;  Laterality: N/A;  . POLYPECTOMY  03/02/2019   Procedure: POLYPECTOMY;  Surgeon: Carol Ada, MD;  [facility];  Service: Endoscopy;;  . Azzie Almas DILATION N/A 03/23/2019   Procedure: Azzie Almas DILATION;  Surgeon: Carol Ada, MD;  [facility];  Service: Endoscopy;  Laterality: N/A;  . SKIN GRAFT Bilateral 1980s   "got burned by some hot water"    There were no vitals filed for this visit.   Subjective Assessment - 09/04/20 1334    Subjective No changes.  Transfers going so-so at home.    Patient is accompained by: Family member   wife [name]   Pertinent History Hx of CVA in 2014 and 2017, has not amb. since OPPT rehab in 2018, seizures, HTN, hx of ETOH and cocaine abuse, HLD    Limitations Walking;Standing    How long can you sit comfortably? unlimited    How long can you stand comfortably? 1 min    How long can you walk comfortably? n/a    Patient Stated Goals Be better at everything: txfs, amb, go to the bathroom.    Currently in Pain? No/denies    Pain Onset More than a month ago                             [facility] Adult PT Treatment/Exercise - 09/04/20 0001      Transfers   Transfers Squat Pivot Transfers;Sit to Stand;Stand to Sit    Sit to Stand With upper extremity assist;From chair/3-in-1;3: Mod assist;4: Min assist   Mod assist to initiate transfer, then min assist to complete (w/c>sink); in [place], Min assist/min guard sit>stand  Sit to Stand Details Verbal cues for sequencing;Verbal cues for technique;Verbal cues for safe use of DME/AE;Manual facilitation for weight shifting;Manual facilitation for placement    Sit to Stand Details (indicate cue type and reason) Using STEDY, hands on bars, pull up to stand, with cues for increased forward lean, x 5 reps, min assist/min guard. Then performed x 2 reps from w/c to stand at sink, mod assist to initiate; min assist to complete.    Stand to Sit 4: Min assist;3: Mod assist;With upper extremity assist;To chair/3-in-1;To bed   Mod assist to mat/to w/c from standing; min assist to [employer] seat   Stand to Sit Details (indicate cue type and reason) Tactile cues for sequencing;Tactile cues for weight shifting;Tactile cues for placement;Verbal cues for  sequencing;Verbal cues for technique;Verbal cues for precautions/safety    Stand to Sit Details Cues to slow, control descent into sitting    Squat Pivot Transfers 4: Min assist;With upper extremity assistance    Squat Pivot Transfer Details (indicate cue type and reason) w/c<>mat table, cues for initial scooting and foot placement.    Comments in [place]: working on standing tolerance, performing partial sit <> stand and standing for 45-60  seconds at a time x 5 reps, assist to cue for weight shifting towards R, cues for B knee extension and tall postue.  1 bout of standing in [place] with head nods x 5, head turns x 5 reps.      Exercises   Exercises Knee/Hip;Other Exercises    Other Exercises  Verbally reviewed seated hamstring stretch and importance of consistent stretching to help lessen clonus/tremors with standing      Knee/Hip Exercises: Aerobic   Stepper Sitting in w/c, level 1, 4 extremities x 1:30, then lower extremities only x 3:30, occasional assist to keep neutral position on RLE, pt able to keep foot flat on footplate throughout.                  PT Education - 09/04/20 1340    Education Details Attempted trial of sit<>stand to see if pt ready to do at home; educated wife and pt to wait on sit>stand at sink, as pt's sit<>stand is still too unpredictable and PT concerned about pt/wife safety.    Person(s) Educated Patient;Spouse    Methods Explanation    Comprehension Verbalized understanding            PT Short Term Goals - 08/28/20 1452      PT SHORT TERM GOAL #1   Title Pt will perform HEP with min A to improve strength, balance, flexibility. TARGET DATE FOR ALL STGS:08/28/20    Baseline inconsistently performing HEP, per report 08/28/2020    Time 4    Period Weeks    Status Not Met      PT SHORT TERM GOAL #2   Title Pt will perform w/c<>mat squat pivot txfs with min A to improve safety during funcitonal mobility at home.    Baseline Mod-max A; at least mod  assist; at times min assist with consistent cues and repetition    Time 4    Period Weeks    Status Not Met      PT SHORT TERM GOAL #3   Title Trial amb. when appropriate and write goals prn.    Baseline Pt with decreased activation of RLE musculature; have not tried ambulation at this time    Time 4    Period Weeks    Status Deferred  PT SHORT TERM GOAL #4   Title Pt will be able to stand with UE support for 30 seconds and min guard in order to perform ADLs.    Baseline 30 sec - up to 2 minutes at counter    Time 4    Period Weeks    Status Achieved             PT Long Term Goals - 07/31/20 1043      PT LONG TERM GOAL #1   Title Pt will perform updated HEP with family supervision for improved strength, balance and gait. TARGET DATE FOR ALL LTGS:    Time 8    Period Weeks    Status New      PT LONG TERM GOAL #2   Title Pt will perform stand pivot w/c<>mat txfs with min guard to improve safety during functional mobility.    Time 8    Period Weeks    Status New      PT LONG TERM GOAL #3   Title Pt will perform sit<>stand txfs with min guard x5 reps to improve safety at home.    Time 8    Period Weeks    Status New      PT LONG TERM GOAL #4   Title Pt will stand at sink for 60 seconds with S in order to perform ADLs.    Time 8    Period Weeks    Status New                 Plan - 09/04/20 1342    Clinical Impression Statement 10th Visit Progress Report, covering dates from 07/31/20 to 09/04/20.  Pt and wife report some improvement in transfers at home, but wife still concerned about pt's balance with transfers if he has an episode of tremors ([place]) RLE.  Pt requires min assist today for squat pivot transfer with consistent cueing from therapist; mod>min assist for sit<>stand from w/c to stand at sink.  With use of STEDY frame in PT sessions, pt able to stand with min assist from mat to supported standing, then min guard from supported stnading to upright  stnading.  Pt albe to stand 45-60 sec in [place] frame.  Able to utilized seated stepper machine x 5 minutes, for lower extremity flexibility and stnreghtening.  Pt has met 1 of 4 STGs.  Limitations from transfers are coming from pt's manual w/c in some disrepair (to have w/c assessment on 09/16/2020 in therapy session).  He responds well to repetition and consistent cueing in therapy session; however, unsure that pt is performing HEP consistently.  He will continue to benefit from skilled PT to further address strength, flexibility, improved trasnfers for improved independence and decreased caregiver burden.    Personal Factors and Comorbidities Comorbidity 3+;Time since onset of injury/illness/exacerbation;Social Background    Comorbidities Hx of CVA in 2014 and 2017, has not amb. since OPPT rehab in 2018, seizures, HTN, hx of ETOH and cocaine abuse, HLD    Examination-Activity Limitations Bed Mobility;Bathing;Bend;Carry;Dressing;Hygiene/Grooming;Lift;Stand;Toileting;Squat;Sleep;Self Feeding;Locomotion Level;Transfers;Reach Overhead    Examination-Participation Restrictions Meal Prep;Driving;Interpersonal Relationship;Laundry;Medication Management;Community Activity    Stability/Clinical Decision Making Stable/Uncomplicated    Rehab Potential Fair    PT Frequency 2x / week    PT Duration 8 weeks    PT Treatment/Interventions ADLs/Self Care Home Management;Electrical Stimulation;DME Instruction;Gait training;Functional mobility training;Therapeutic activities;Therapeutic exercise;Balance training;Neuromuscular re-education;Manual techniques;Wheelchair mobility training;Orthotic Fit/Training;Patient/family education;Cognitive remediation;Vestibular    PT Next Visit Plan Continue to work on transfers, sit<>stand in  STEDY or in parallel bars.  Keep trying sit<>stand at sink and educate wife when appropriate to begin to do at home. Letta Moynahan) discussed power w/c as an option but wasn't able to trial due to not being  able to move joystick over (screw was stripped); WB through RLE, timing and safety with sit > stand and transfers.    PT Home Exercise Plan Access Code: [email or web address]    Consulted and Agree with Plan of Care Patient;Family member/caregiver    Family Member Consulted [name]: wife           Patient will benefit from skilled therapeutic intervention in order to improve the following deficits and impairments:  Difficulty walking,Decreased coordination  Visit Diagnosis: Unsteadiness on feet  Spastic hemiplegia of right dominant side as late effect of cerebral infarction [facility])     Problem List Patient Active Problem List   Diagnosis Date Noted  . History of stroke 06/09/2017  . Seizure ([place]) 05/22/2016  . Anxiety state 04/20/2016  . Spastic hemiplegia affecting dominant side ([place]) 09/11/2015  . Infection of wound due to methicillin resistant Staphylococcus aureus (MRSA)   . 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 Right hemiparesis ([place]) 07/08/2015  . Acute left ACA ischemic stroke ([place]) 07/08/2015  . Cerebrovascular accident (CVA) ([place])   . Stroke ([place]) 07/02/2015  . Acute ischemic stroke ([place]) 07/02/2015  . CVA (cerebral infarction) 01/15/2013  . Essential hypertension 01/15/2013  . Nicotine dependence 01/15/2013  . Alcohol abuse 01/15/2013    Frazier Butt. 09/04/2020, 1:49 PM Frazier Butt., PT  [name] [address] [place], [name], [address] Phone: [phone]   Fax:  [phone]  Name: Gerald [name] MRN: [id number] Date of Birth: [birth year]

## 2020-09-05 DIAGNOSIS — R7303 Prediabetes: Secondary | ICD-10-CM | POA: Diagnosis not present

## 2020-09-05 DIAGNOSIS — I10 Essential (primary) hypertension: Secondary | ICD-10-CM | POA: Diagnosis not present

## 2020-09-05 DIAGNOSIS — E785 Hyperlipidemia, unspecified: Secondary | ICD-10-CM | POA: Diagnosis not present

## 2020-09-08 DIAGNOSIS — I10 Essential (primary) hypertension: Secondary | ICD-10-CM | POA: Diagnosis not present

## 2020-09-08 DIAGNOSIS — I639 Cerebral infarction, unspecified: Secondary | ICD-10-CM | POA: Diagnosis not present

## 2020-09-08 DIAGNOSIS — R7303 Prediabetes: Secondary | ICD-10-CM | POA: Diagnosis not present

## 2020-09-08 DIAGNOSIS — E785 Hyperlipidemia, unspecified: Secondary | ICD-10-CM | POA: Diagnosis not present

## 2020-09-09 ENCOUNTER — Other Ambulatory Visit: Payer: Self-pay

## 2020-09-09 ENCOUNTER — Ambulatory Visit: Payer: PPO | Admitting: Occupational Therapy

## 2020-09-09 ENCOUNTER — Ambulatory Visit: Payer: PPO | Admitting: Physical Therapy

## 2020-09-09 ENCOUNTER — Ambulatory Visit: Payer: PPO

## 2020-09-09 ENCOUNTER — Encounter: Payer: Self-pay | Admitting: Physical Therapy

## 2020-09-09 DIAGNOSIS — R278 Other lack of coordination: Secondary | ICD-10-CM

## 2020-09-09 DIAGNOSIS — I69351 Hemiplegia and hemiparesis following cerebral infarction affecting right dominant side: Secondary | ICD-10-CM

## 2020-09-09 DIAGNOSIS — R41841 Cognitive communication deficit: Secondary | ICD-10-CM

## 2020-09-09 DIAGNOSIS — R2689 Other abnormalities of gait and mobility: Secondary | ICD-10-CM

## 2020-09-09 DIAGNOSIS — R208 Other disturbances of skin sensation: Secondary | ICD-10-CM

## 2020-09-09 DIAGNOSIS — R2681 Unsteadiness on feet: Secondary | ICD-10-CM

## 2020-09-09 DIAGNOSIS — R41840 Attention and concentration deficit: Secondary | ICD-10-CM

## 2020-09-09 DIAGNOSIS — R4701 Aphasia: Secondary | ICD-10-CM

## 2020-09-09 DIAGNOSIS — R41844 Frontal lobe and executive function deficit: Secondary | ICD-10-CM

## 2020-09-09 DIAGNOSIS — R471 Dysarthria and anarthria: Secondary | ICD-10-CM
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
## 2020-09-11 ENCOUNTER — Ambulatory Visit: Payer: PPO | Admitting: Physical Therapy

## 2020-09-11 ENCOUNTER — Ambulatory Visit: Payer: PPO

## 2020-09-16 ENCOUNTER — Other Ambulatory Visit: Payer: Self-pay

## 2020-09-16 ENCOUNTER — Ambulatory Visit: Payer: PPO

## 2020-09-16 ENCOUNTER — Ambulatory Visit: Payer: PPO | Admitting: Occupational Therapy

## 2020-09-16 ENCOUNTER — Encounter: Payer: Self-pay | Admitting: Physical Therapy

## 2020-09-16 ENCOUNTER — Encounter: Payer: Self-pay | Admitting: Occupational Therapy

## 2020-09-16 ENCOUNTER — Ambulatory Visit: Payer: PPO | Attending: Adult Health | Admitting: Physical Therapy

## 2020-09-16 DIAGNOSIS — R2689 Other abnormalities of gait and mobility: Secondary | ICD-10-CM | POA: Insufficient documentation

## 2020-09-16 DIAGNOSIS — R4701 Aphasia: Secondary | ICD-10-CM | POA: Diagnosis not present

## 2020-09-16 DIAGNOSIS — R278 Other lack of coordination: Secondary | ICD-10-CM | POA: Insufficient documentation

## 2020-09-16 DIAGNOSIS — R482 Apraxia: Secondary | ICD-10-CM | POA: Diagnosis not present

## 2020-09-16 DIAGNOSIS — I69351 Hemiplegia and hemiparesis following cerebral infarction affecting right dominant side: Secondary | ICD-10-CM | POA: Insufficient documentation

## 2020-09-16 DIAGNOSIS — M6281 Muscle weakness (generalized): Secondary | ICD-10-CM | POA: Insufficient documentation

## 2020-09-16 DIAGNOSIS — R41844 Frontal lobe and executive function deficit: Secondary | ICD-10-CM

## 2020-09-16 DIAGNOSIS — R471 Dysarthria and anarthria: Secondary | ICD-10-CM | POA: Insufficient documentation

## 2020-09-16 DIAGNOSIS — I69322 Dysarthria following cerebral infarction: Secondary | ICD-10-CM | POA: Insufficient documentation

## 2020-09-16 DIAGNOSIS — R208 Other disturbances of skin sensation: Secondary | ICD-10-CM | POA: Diagnosis not present

## 2020-09-16 DIAGNOSIS — R41841 Cognitive communication deficit: Secondary | ICD-10-CM | POA: Diagnosis not present

## 2020-09-16 DIAGNOSIS — R41840 Attention and concentration deficit: Secondary | ICD-10-CM | POA: Insufficient documentation

## 2020-09-16 DIAGNOSIS — R2681 Unsteadiness on feet: Secondary | ICD-10-CM | POA: Diagnosis not present

## 2020-09-16 NOTE — Patient Instructions (Signed)
Practice saying these sentences:  Gerald need _________.      Get me _________.      Gerald want __________.     Gerald am ready ________.      Do you have a________?      Where is the _______?    What is the ________?    Who has the ________?
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
## 2020-09-16 NOTE — Therapy (Signed)
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 dressing, grooming, moving from room to room)                                                             [  x] Yes '[]'  No Will mobility device sufficiently improve ability to participate and/or be aided in participation of MRADL's?         '[x]'  Yes '[]'  No Can limitation be compensated for with use of a cane or walker?                                                                                '[]'  Yes '[x]'  No Does patient  or caregiver demonstrate ability/potential ability & willingness to safely use the mobility device?   '[x]'  Yes '[]'  No Does patient's home environment support use of recommended mobility device?                                                    '[x]'  Yes '[]'  No Does patient have sufficient upper extremity function necessary to functionally propel a manual wheelchair?    '[x]'  Yes '[]'  No Does patient have sufficient strength and trunk stability to safely operate a POV (scooter)?                                  '[]'  Yes '[x]'  No Does patient need additional features/benefits provided by a power wheelchair for MRADL's in the home?       '[]'  Yes '[x]'  No Does the patient demonstrate the ability to safely use a power wheelchair?                                                              '[x]'  Yes '[]'  No  Therapist Name Printed: ????? Date: ?????  Therapist's Signature:   Date:   Supplier's Name Printed: ????? Date: ?????  Supplier's Signature:   Date:  Patient/Caregiver Signature:   Date:     This is to certify that I have read this evaluation and do agree with the content within:      Physician's Name Printed: ?????  Physician's Signature:  Date:     This is to certify that I, the above signed therapist have the following affiliations: '[]'  This DME provider '[]'  Manufacturer of recommended equipment '[]'  Patient's long term care facility '[x]'  None of the above                           PT Short Term Goals - 08/28/20 1452      PT SHORT TERM GOAL #1   Title Pt will perform HEP with min A to improve strength, balance, flexibility. TARGET DATE FOR ALL STGS:08/28/20    Baseline inconsistently performing HEP, per report 08/28/2020    Time 4    Period Weeks    Status Not Met      PT SHORT TERM GOAL #2   Title Pt will perform w/c<>mat squat  pivot txfs with min A to improve safety during funcitonal mobility at home.    Baseline Mod-max A; at least mod assist; at times min assist with  consistent cues and repetition    Time 4    Period Weeks    Status Not Met      PT SHORT TERM GOAL #3   Title Trial amb. when appropriate and write goals prn.    Baseline Pt with decreased activation of RLE musculature; have not tried ambulation at this time    Time 4    Period Weeks    Status Deferred      PT SHORT TERM GOAL #4   Title Pt will be able to stand with UE support for 30 seconds and min guard in order to perform ADLs.    Baseline 30 sec - up to 2 minutes at counter    Time 4    Period Weeks    Status Achieved             PT Long Term Goals - 09/16/20 1554      PT LONG TERM GOAL #1   Title Pt will perform updated HEP with family supervision for improved strength, balance and gait. TARGET DATE FOR ALL LTGS: 09/26/2020    Time 8    Period Weeks    Status New      PT LONG TERM GOAL #2   Title Pt will perform stand pivot w/c<>mat txfs with min guard to improve safety during functional mobility.    Time 8    Period Weeks    Status New      PT LONG TERM GOAL #3   Title Pt will perform sit<>stand txfs with min guard x5 reps to improve safety at home.    Time 8    Period Weeks    Status New      PT LONG TERM GOAL #4   Title Pt will stand at sink for 60 seconds with S in order to perform ADLs.    Time 8    Period Weeks    Status New                 Plan - 09/16/20 1551    Clinical Impression Statement Wheelchair assessment completed today with vendor present.  Brandon with [employer], present with pt, wife, and PT.  Pt is agreeable to recommendation of more lightweight wheelchair, with ability to continue to steer w/c with LUE and LLE.  For additional details, see full note.    Personal Factors and Comorbidities Comorbidity 3+;Time since onset of injury/illness/exacerbation;Social Background    Comorbidities Hx of CVA in 2014 and 2017, has not amb. since OPPT rehab in 2018, seizures, HTN, hx of ETOH and cocaine abuse, HLD    Examination-Activity  Limitations Bed Mobility;Bathing;Bend;Carry;Dressing;Hygiene/Grooming;Lift;Stand;Toileting;Squat;Sleep;Self Feeding;Locomotion Level;Transfers;Reach Overhead    Examination-Participation Restrictions Meal Prep;Driving;Interpersonal Relationship;Laundry;Medication Management;Community Activity    Stability/Clinical Decision Making Stable/Uncomplicated    Rehab Potential Fair    PT Frequency 2x / week    PT Duration 8 weeks    PT Treatment/Interventions ADLs/Self Care Home Management;Electrical Stimulation;DME Instruction;Gait training;Functional mobility training;Therapeutic activities;Therapeutic exercise;Balance training;Neuromuscular re-education;Manual techniques;Wheelchair mobility training;Orthotic Fit/Training;Patient/family education;Cognitive remediation;Vestibular    PT Next Visit Plan Continue to work on transfers, sit<>stand in [place] or in parallel bars.  Keep trying sit<>stand at sink and educate wife when appropriate to begin to do at home.  WB through RLE, timing and safety with sit > stand and transfers.    PT  Home Exercise Plan Access Code: [email or web address]    Consulted and Agree with Plan of Care Patient;Family member/caregiver    Family Member Consulted Gerald [name]: wife           Patient will benefit from skilled therapeutic intervention in order to improve the following deficits and impairments:  Difficulty walking,Decreased coordination  Visit Diagnosis: Spastic hemiplegia of right dominant side as late effect of cerebral infarction ([place])  Unsteadiness on feet     Problem List Patient Active Problem List   Diagnosis Date Noted  . History of stroke 06/09/2017  . Seizure ([place]) 05/22/2016  . Anxiety state 04/20/2016  . Spastic hemiplegia affecting dominant side ([place]) 09/11/2015  . Infection of wound due to methicillin resistant Staphylococcus aureus (MRSA)   . 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 Right hemiparesis ([place]) 07/08/2015  . Acute left ACA ischemic stroke ([place]) 07/08/2015  . Cerebrovascular accident (CVA) ([place])   . Stroke ([place]) 07/02/2015  . Acute ischemic stroke ([place]) 07/02/2015  . CVA (cerebral infarction) 01/15/2013  . Essential hypertension 01/15/2013  . Nicotine dependence 01/15/2013  . Alcohol abuse 01/15/2013    Ceili Boshers W. 09/16/2020, 3:56 PM  Frazier Butt., PT   [facility] [address] [place], [name], [address] Phone: [phone]   Fax:  [phone]  Name: Gerald [name] MRN: [id number] Date of Birth: Feb 11, 1961

## 2020-09-18 ENCOUNTER — Other Ambulatory Visit: Payer: Self-pay

## 2020-09-18 ENCOUNTER — Ambulatory Visit: Payer: PPO

## 2020-09-18 ENCOUNTER — Ambulatory Visit: Payer: PPO | Admitting: Occupational Therapy

## 2020-09-18 DIAGNOSIS — R471 Dysarthria and anarthria: Secondary | ICD-10-CM

## 2020-09-18 DIAGNOSIS — I69351 Hemiplegia and hemiparesis following cerebral infarction affecting right dominant side: Secondary | ICD-10-CM | POA: Diagnosis not present

## 2020-09-18 DIAGNOSIS — R208 Other disturbances of skin sensation: Secondary | ICD-10-CM

## 2020-09-18 DIAGNOSIS — R4701 Aphasia: Secondary | ICD-10-CM

## 2020-09-18 DIAGNOSIS — R41841 Cognitive communication deficit: Secondary | ICD-10-CM

## 2020-09-18 DIAGNOSIS — M6281 Muscle weakness (generalized): Secondary | ICD-10-CM

## 2020-09-18 DIAGNOSIS — R41844 Frontal lobe and executive function deficit: Secondary | ICD-10-CM

## 2020-09-18 DIAGNOSIS — R41840 Attention and concentration deficit: Secondary | ICD-10-CM

## 2020-09-18 DIAGNOSIS — R2689 Other abnormalities of gait and mobility: Secondary | ICD-10-CM

## 2020-09-18 DIAGNOSIS — R278 Other lack of coordination: Secondary | ICD-10-CM

## 2020-09-18 NOTE — Patient Instructions (Signed)
  Write down your favorite restaurants and what you get there:
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
## 2020-09-18 NOTE — Therapy (Signed)
[facility] [address] [place], [name], [address] Phone: [phone]   Fax:  [phone]  Speech Language Pathology Treatment  Patient Details  Name: Gerald [name] MRN: [id number] Date of Birth: August 11, 1960 Referring Provider (SLP): Frann Rider NP   Encounter Date: 09/18/2020   End of Session - 09/18/20 1057    Visit Number 8    Number of Visits 17    Date for SLP Re-Evaluation 11/20/20    SLP Start Time 6384    SLP Stop Time  1230    SLP Time Calculation (min) 45 min    Activity Tolerance Patient tolerated treatment well           Past Medical History:  Diagnosis Date  . Alcohol abuse   . Cocaine abuse ([place]) 2014  . Hypertension   . Seizures ([place]) 07/2015   had first seizures about 6 months after stroke  . Stroke [facility]) 2014   denies residual on 07/03/2015  . Stroke ([place]) 07/02/2015   "now weak on right side; speech problems" (07/03/2015)  . Tobacco use     Past Surgical History:  Procedure Laterality Date  . COLONOSCOPY WITH PROPOFOL N/A 03/02/2019   Procedure: COLONOSCOPY WITH PROPOFOL;  Surgeon: Carol Ada, MD;  [facility];  Service: Endoscopy;  Laterality: N/A;  . ESOPHAGOGASTRODUODENOSCOPY (EGD) WITH PROPOFOL N/A 03/23/2019   Procedure: ESOPHAGOGASTRODUODENOSCOPY (EGD) WITH PROPOFOL;  Surgeon: Carol Ada, MD;  [facility];  Service: Endoscopy;  Laterality: N/A;  . POLYPECTOMY  03/02/2019   Procedure: POLYPECTOMY;  Surgeon: Carol Ada, MD;  [facility];  Service: Endoscopy;;  . Azzie Almas DILATION N/A 03/23/2019   Procedure: Azzie Almas DILATION;  Surgeon: Carol Ada, MD;  [facility];  Service: Endoscopy;  Laterality: N/A;  . SKIN GRAFT Bilateral 1980s   "got burned by some hot water"    There were no vitals filed for this visit.   Subjective Assessment - 09/18/20 1146    Subjective "fine"    Patient is accompained by: Family member   Gerald [name]    Currently in Pain? No/denies                 ADULT SLP TREATMENT - 09/18/20 1147      General Information   Behavior/Cognition Alert;Cooperative;Pleasant mood      Treatment Provided   Treatment provided Cognitive-Linquistic      Cognitive-Linquistic Treatment   Treatment focused on Aphasia;Dysarthria;Patient/family/caregiver education    Skilled Treatment Pt reported he was "tired" after PT/OT sessions. SLP engaged patient in functional naming tasks related to personally relevant categories. Pt able to name 10+ items in 3 categories with 70% accuacy which improved to 97% accuracy with min to mod A for semantic cues, first letters, or phonemic cues. SLP targeted simple close ended questions with pt able to answer with good accuracy. Pt had difficulty with simple open-ended questions this session. SLP recommends that pt practice naming at home, speaking in full sentences, and answering with specificity.      Assessment / Recommendations / Plan   Plan Continue with current plan of care      Progression Toward Goals   Progression toward goals Progressing toward goals            SLP Education - 09/18/20 1344    Education Details HEP, naming tasks, specificity to improve listener comprehension    Person(s) Educated Patient;Spouse    Methods Explanation;Demonstration;Handout    Comprehension Verbalized understanding;Returned demonstration;Need further instruction  SLP Short Term Goals - 09/18/20 1056      SLP SHORT TERM GOAL #1   Title Pt will complete HEP with min A over 3 sessions    Baseline 09-02-20, 09-09-20    Time 1    Period Weeks   or 9 total visits for all STGs   Status On-going      SLP SHORT TERM GOAL #2   Title Pt will verbally generate 10 items in personally relevant categories with min A over 3 sessions    Baseline 09-04-20, 09-16-20, 09-18-20    Time --    Period --    Status Achieved      SLP SHORT TERM GOAL #3   Title Pt will produce sentence  responses with compensations for 80% intelligibilty with occassional min A over 3 sessions    Baseline 09-02-20, 09-04-20, 09-18-20    Time --    Period --    Status Achieved      SLP SHORT TERM GOAL #4   Title Pt will use word finding compensations in simple 5 minute conversation with min A over 3 sessions    Time 1    Period Weeks    Status On-going      SLP SHORT TERM GOAL #5   Title Pt will use mulitmodal communication (gesture, draw, write 1st letter etc) to augment verbal expression with min A over 3 sessions    Baseline 09-18-20    Time 1    Period Weeks    Status On-going      SLP SHORT TERM GOAL #6   Title Pt's caregiver will complete qualitative communication rating scale in first 2 sessions    Baseline CES: 16 & Comm SF: 13    Status Achieved            SLP Long Term Goals - 09/18/20 1056      SLP LONG TERM GOAL #1   Title Pt will be 90% intelligible in 10 simple conversation with min A over 2 sessions    Time 5    Period Weeks   or 17 total visits for all LTGs   Status On-going      SLP LONG TERM GOAL #2   Title Pt will appropriately compensate for word finding versus interjecting "I don't know" for 8/10 opportunities across 3 sessions    Time 5    Period Weeks    Status On-going      SLP LONG TERM GOAL #3   Title Pt will use mulitmodal communication (gesture, draw, write 1st letter etc) to augment verbal expression to meet needs at home with rare min A from family.    Time 5    Period Weeks    Status On-going      SLP LONG TERM GOAL #4   Title Caregiver will report improvements in communication effectiveness via QOL scale with > 3 point increase    Time 5    Period Weeks    Status On-going            Plan - 09/18/20 1345    Clinical Impression Statement Gerald [name] presents with improvements in moderate expressive aphasia and dysarthria. Pt exhibits improved word finding and increased MLU since initiation of ST services. Pt only stated "I don't know" when  word finding occurred x4 this session. Pt able to generate words to describe current environment/home environment with min to mod A for semantic and phonemic cues. Pt able to answer simple closed ended questions  with improved accuracy versus open-ended questions. See "skilled treatment" for additional details of today's session. Given presenation of mild to moderate aphasia, dysarthria, and possible cognitive changes, pt would benefit from skilled ST intervention to increase effectiveness of communication, reduce caregiver burden, and improve QOL.    Speech Therapy Frequency 2x / week    Duration 8 weeks   or 17 total visits   Treatment/Interventions Compensatory strategies;Functional tasks;Patient/family education;Cueing hierarchy;Cognitive reorganization;Multimodal communcation approach;Other (comment);Compensatory techniques;Internal/external aids;SLP instruction and feedback;Language facilitation    Potential to Achieve Goals Good    Potential Considerations Previous level of function;Severity of impairments    SLP Home Exercise Plan provided    Consulted and Agree with Plan of Care Patient;Family member/caregiver    Family Member Gerald [name]           Patient will benefit from skilled therapeutic intervention in order to improve the following deficits and impairments:   Aphasia  Dysarthria and anarthria  Cognitive communication deficit    Problem List Patient Active Problem List   Diagnosis Date Noted  . History of stroke 06/09/2017  . Seizure ([place]) 05/22/2016  . Anxiety state 04/20/2016  . Spastic hemiplegia affecting dominant side ([place]) 09/11/2015  . Infection of wound due to methicillin resistant Staphylococcus aureus (MRSA)   . 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 Right hemiparesis ([place]) 07/08/2015  . Acute left ACA ischemic stroke ([place]) 07/08/2015  . Cerebrovascular  accident (CVA) ([place])   . Stroke ([place]) 07/02/2015  . Acute ischemic stroke ([place]) 07/02/2015  . CVA (cerebral infarction) 01/15/2013  . Essential hypertension 01/15/2013  . Nicotine dependence 01/15/2013  . Alcohol abuse 01/15/2013    Gerald Deem, MA CCC-SLP 09/18/2020, 1:48 PM  [facility] [address] [place], [name], [address] Phone: [phone]   Fax:  [phone]   Name: Gerald [name] MRN: [id number] Date of Birth: May 03, 1961

## 2020-09-23 ENCOUNTER — Ambulatory Visit: Payer: PPO

## 2020-09-23 ENCOUNTER — Ambulatory Visit: Payer: PPO | Admitting: Occupational Therapy

## 2020-09-23 ENCOUNTER — Other Ambulatory Visit: Payer: Self-pay

## 2020-09-23 DIAGNOSIS — M6281 Muscle weakness (generalized): Secondary | ICD-10-CM

## 2020-09-23 DIAGNOSIS — R4701 Aphasia: Secondary | ICD-10-CM

## 2020-09-23 DIAGNOSIS — I69351 Hemiplegia and hemiparesis following cerebral infarction affecting right dominant side: Secondary | ICD-10-CM

## 2020-09-23 DIAGNOSIS — R208 Other disturbances of skin sensation: Secondary | ICD-10-CM

## 2020-09-23 DIAGNOSIS — R41844 Frontal lobe and executive function deficit: Secondary | ICD-10-CM

## 2020-09-23 DIAGNOSIS — R2689 Other abnormalities of gait and mobility: Secondary | ICD-10-CM

## 2020-09-23 DIAGNOSIS — R2681 Unsteadiness on feet: Secondary | ICD-10-CM

## 2020-09-23 DIAGNOSIS — R471 Dysarthria and anarthria: Secondary | ICD-10-CM

## 2020-09-23 DIAGNOSIS — R41840 Attention and concentration deficit: Secondary | ICD-10-CM

## 2020-09-23 DIAGNOSIS — R278 Other lack of coordination: Secondary | ICD-10-CM

## 2020-09-23 NOTE — Therapy (Signed)
[facility] [address] [place], [name], [address] Phone: [phone]   Fax:  [phone]  Occupational Therapy Treatment  Patient Details  Name: Gerald [name] MRN: [id number] Date of Birth: [birth year] Referring Provider (OT): Frann Rider , NP   Encounter Date: 09/23/2020   OT End of Session - 09/23/20 1133    Visit Number 12    Number of Visits 25    Authorization Type [employer]    Authorization Time Period 90 day anticpate -d/c after 8 weeks    Authorization - Visit Number 11    Authorization - Number of Visits 20    OT Start Time 1104    OT Stop Time 1144    OT Time Calculation (min) 40 min           Past Medical History:  Diagnosis Date  . Alcohol abuse   . Cocaine abuse ([place]) 2014  . Hypertension   . Seizures ([place]) 07/2015   had first seizures about 6 months after stroke  . Stroke [facility]) 2014   denies residual on 07/03/2015  . Stroke ([place]) 07/02/2015   "now weak on right side; speech problems" (07/03/2015)  . Tobacco use     Past Surgical History:  Procedure Laterality Date  . COLONOSCOPY WITH PROPOFOL N/A 03/02/2019   Procedure: COLONOSCOPY WITH PROPOFOL;  Surgeon: Carol Ada, MD;  [facility];  Service: Endoscopy;  Laterality: N/A;  . ESOPHAGOGASTRODUODENOSCOPY (EGD) WITH PROPOFOL N/A 03/23/2019   Procedure: ESOPHAGOGASTRODUODENOSCOPY (EGD) WITH PROPOFOL;  Surgeon: Carol Ada, MD;  [facility];  Service: Endoscopy;  Laterality: N/A;  . POLYPECTOMY  03/02/2019   Procedure: POLYPECTOMY;  Surgeon: Carol Ada, MD;  [facility];  Service: Endoscopy;;  . Azzie Almas DILATION N/A 03/23/2019   Procedure: Azzie Almas DILATION;  Surgeon: Carol Ada, MD;  [facility];  Service: Endoscopy;  Laterality: N/A;  . SKIN GRAFT Bilateral 1980s   "got burned by some hot water"    There were no vitals filed for this visit.   Subjective Assessment - 09/23/20  1207    Subjective  Pt denies pain    Pertinent History Gerald [name] is a 60 y.o. male with history of ischemic stroke in 08/2015 resulting in right hemiparesis, dysarthria and seizure disorder.  PMH: alcohol abuse,HTN, seizure, CVA. Pt is using bacholphen fo spasticity, pr previously received botox, however spasticity is worse now that he is not receiving botox.    Patient Stated Goals to get better    Currently in Pain? No/denies                  Treatment: Tableslides for shoulder flexion and abduction, min vc. And facilitation for positioning Simulated eating scooping beans into container, min-mod v.c to get started then pt performed with supervision Functional reaching to place graded clothespins on rack with RUE min difficulty and v.c for grasp/ release Placing pennies into bank with RUE, with good release today. Mid level reach for grasp / release of cones, min v.c and facilitation for initiation.                OT Short Term Goals - 09/16/20 1419      OT SHORT TERM GOAL #1   Title Pt will perform initial HEP with min cueing. 08/29/20    Time 4    Period Weeks    Status Achieved    Target Date 08/29/20      OT SHORT TERM GOAL #2  Title Pt will use RUE as a gross assist/stabilizer at least 25% of the time.    Baseline uses 5%    Time 4    Period Weeks    Status Achieved   10% or less  09/16/20:  25%     OT SHORT TERM GOAL #3   Title Pt will demo at least 55 * shoulder flex for functional reach    Baseline 45    Time 4    Period Weeks    Status Achieved   80  with mod compensation     OT SHORT TERM GOAL #4   Title Pt will demonstrate ability to grasp and relase a 1 inch block into container 3/5 trials    Time 4    Period Weeks    Status Achieved   6/6 today     OT SHORT TERM GOAL #5   Title --             OT Long Term Goals - 09/23/20 1114      OT LONG TERM GOAL #1   Title Pt/caregiver will be independent with updated HEP.-10/23/20     Status On-going      OT LONG TERM GOAL #2   Title Pt will demo at least 65* shoulder flex for functional reach .    Status Achieved      OT LONG TERM GOAL #3   Title Pt will use RUE as a gross A for ADLS at least 09% of the time.    Status On-going      OT LONG TERM GOAL #4   Title Pt will demonstrate A/ROM  elbow extension -50 for increased functional reach.    Status On-going      OT LONG TERM GOAL #5   Title Pt will be able to consistently release object in R hand 4/5 trials to place in container    Status On-going      OT LONG TERM GOAL #6   Title Pt will perform toilet transfers with min A    Status On-going   mod A, pt's wife returned demonstration                Plan - 09/23/20 1208    Clinical Impression Statement Pt iscontinues to progress towards goals. He reports using his right hand more at home.    OT Occupational Profile and History Detailed Assessment- Review of Records and additional review of physical, cognitive, psychosocial history related to current functional performance    Occupational performance deficits (Please refer to evaluation for details): ADL's;IADL's;Leisure;Social Participation    Body Structure / Function / Physical Skills ADL;UE functional use;Muscle spasms;Endurance;Balance;Flexibility;Pain;FMC;ROM;Gait;Coordination;GMC;Sensation;Decreased knowledge of precautions;Decreased knowledge of use of DME;IADL;Dexterity;Strength;Mobility;Tone    Cognitive Skills Attention;Memory;Problem Solve;Safety Awareness;Thought;Understand    Rehab Potential Good    OT Frequency 2x / week    OT Duration 12 weeks    OT Treatment/Interventions Self-care/ADL training;Ultrasound;Visual/perceptual remediation/compensation;DME and/or AE instruction;Scar mobilization;Patient/family education;Balance training;Passive range of motion;Paraffin;Gait Training;Cryotherapy;Fluidtherapy;Splinting;Functional Mobility Training;Moist Heat;Therapeutic exercise;Manual  Therapy;Therapeutic activities;Cognitive remediation/compensation;Neuromuscular education    Plan continue functional use of RUE, low-mid range shoulder flexion/ functional reach, toilet transfers    Consulted and Agree with Plan of Care Patient;Family member/caregiver    Family Member Consulted [name]           Patient will benefit from skilled therapeutic intervention in order to improve the following deficits and impairments:   Body Structure / Function / Physical Skills: ADL,UE functional use,Muscle spasms,Endurance,Balance,Flexibility,Pain,FMC,ROM,Gait,Coordination,GMC,Sensation,Decreased knowledge of  precautions,Decreased knowledge of use of DME,IADL,Dexterity,Strength,Mobility,Tone Cognitive Skills: Attention,Memory,Problem Solve,Safety Awareness,Thought,Understand     Visit Diagnosis: Muscle weakness (generalized)  Other lack of coordination  Spastic hemiplegia of right dominant side as late effect of cerebral infarction (HCC)  Other disturbances of skin sensation  Frontal lobe and executive function deficit  Attention and concentration deficit    Problem List Patient Active Problem List   Diagnosis Date Noted  . History of stroke 06/09/2017  . Seizure ([place]) 05/22/2016  . Anxiety state 04/20/2016  . Spastic hemiplegia affecting dominant side ([place]) 09/11/2015  . Infection of wound due to methicillin resistant Staphylococcus aureus (MRSA)   . 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 Right hemiparesis ([place]) 07/08/2015  . Acute left ACA ischemic stroke ([place]) 07/08/2015  . Cerebrovascular accident (CVA) ([place])   . Stroke ([place]) 07/02/2015  . Acute ischemic stroke ([place]) 07/02/2015  . CVA (cerebral infarction) 01/15/2013  . Essential hypertension 01/15/2013  . Nicotine dependence 01/15/2013  . Alcohol abuse 01/15/2013    Gerald [name] 09/23/2020, 12:09 PM  [facility] [address] [place], [name], [address] Phone: [phone]   Fax:  [phone]  Name: Gerald [name] MRN: [id number] Date of Birth: 1961-05-13

## 2020-09-23 NOTE — Therapy (Signed)
[facility] [address] [place], [name], [address] Phone: [phone]   Fax:  [phone]  Physical Therapy Treatment  Patient Details  Name: Gerald [name] MRN: [id number] Date of Birth: 05-Jul-1960 Referring Provider (PT): Frann Rider, NP   Encounter Date: 09/23/2020   PT End of Session - 09/23/20 1029    Visit Number 14    Number of Visits 17    Date for PT Re-Evaluation 09/29/20    Authorization Type [employer]-one co-pay if visits on same day.    PT Start Time 1015    PT Stop Time 1100    PT Time Calculation (min) 45 min    Equipment Utilized During Treatment Gait belt;Other (comment)   Pt's R AFO; wife has helped him don it prior to session   Activity Tolerance Patient tolerated treatment well    Behavior During Therapy [facility] for tasks assessed/performed           Past Medical History:  Diagnosis Date  . Alcohol abuse   . Cocaine abuse ([place]) 2014  . Hypertension   . Seizures ([place]) 07/2015   had first seizures about 6 months after stroke  . Stroke [facility]) 2014   denies residual on 07/03/2015  . Stroke ([place]) 07/02/2015   "now weak on right side; speech problems" (07/03/2015)  . Tobacco use     Past Surgical History:  Procedure Laterality Date  . COLONOSCOPY WITH PROPOFOL N/A 03/02/2019   Procedure: COLONOSCOPY WITH PROPOFOL;  Surgeon: Carol Ada, MD;  [facility];  Service: Endoscopy;  Laterality: N/A;  . ESOPHAGOGASTRODUODENOSCOPY (EGD) WITH PROPOFOL N/A 03/23/2019   Procedure: ESOPHAGOGASTRODUODENOSCOPY (EGD) WITH PROPOFOL;  Surgeon: Carol Ada, MD;  [facility];  Service: Endoscopy;  Laterality: N/A;  . POLYPECTOMY  03/02/2019   Procedure: POLYPECTOMY;  Surgeon: Carol Ada, MD;  [facility];  Service: Endoscopy;;  . Azzie Almas DILATION N/A 03/23/2019   Procedure: Azzie Almas DILATION;  Surgeon: Carol Ada, MD;  [facility];  Service: Endoscopy;   Laterality: N/A;  . SKIN GRAFT Bilateral 1980s   "got burned by some hot water"    There were no vitals filed for this visit.     Sit to stand in parallel bars with bil HHA and min A from PT (mod A): 8 times during session Standing lateral weight shifts: bil HHA: 20x Standing marching: 10x, pt unable to lift eighter legs but able to shift weight with bouts of R knee buckling which needed blocking in front.  Walking in parallel bars: 1-2 HHA (pt performs better with just L UE support on hand rail), boot sliders on: 3 x 5 feet  Wheelchair push and pull in sitting: hamstring pulls: bil, min A, pt uses L UE to move leg forward to prepare for pull, cues for leaning fwd to improve WB through R LE. Quad pushes unilateral with L LE on leg support: max A required Sanding marching: mod A to shift weight to R and block the r Knee. bil HHA on parallel bars: 10x                         PT Education - 09/23/20 1124    Education Details Patient and wife educated to practice sit to stand at sink from wheelchair 10x everyday. They should also practice wheelchair push and pulls using his legs at home to improve muscle activation in R LE    Person(s) Educated Patient;Spouse  Methods Explanation    Comprehension Verbalized understanding            PT Short Term Goals - 08/28/20 1452      PT SHORT TERM GOAL #1   Title Pt will perform HEP with min A to improve strength, balance, flexibility. TARGET DATE FOR ALL STGS:08/28/20    Baseline inconsistently performing HEP, per report 08/28/2020    Time 4    Period Weeks    Status Not Met      PT SHORT TERM GOAL #2   Title Pt will perform w/c<>mat squat pivot txfs with min A to improve safety during funcitonal mobility at home.    Baseline Mod-max A; at least mod assist; at times min assist with consistent cues and repetition    Time 4    Period Weeks    Status Not Met      PT SHORT TERM GOAL #3   Title Trial amb. when  appropriate and write goals prn.    Baseline Pt with decreased activation of RLE musculature; have not tried ambulation at this time    Time 4    Period Weeks    Status Deferred      PT SHORT TERM GOAL #4   Title Pt will be able to stand with UE support for 30 seconds and min guard in order to perform ADLs.    Baseline 30 sec - up to 2 minutes at counter    Time 4    Period Weeks    Status Achieved             PT Long Term Goals - 09/16/20 1554      PT LONG TERM GOAL #1   Title Pt will perform updated HEP with family supervision for improved strength, balance and gait. TARGET DATE FOR ALL LTGS: 09/26/2020    Time 8    Period Weeks    Status New      PT LONG TERM GOAL #2   Title Pt will perform stand pivot w/c<>mat txfs with min guard to improve safety during functional mobility.    Time 8    Period Weeks    Status New      PT LONG TERM GOAL #3   Title Pt will perform sit<>stand txfs with min guard x5 reps to improve safety at home.    Time 8    Period Weeks    Status New      PT LONG TERM GOAL #4   Title Pt will stand at sink for 60 seconds with S in order to perform ADLs.    Time 8    Period Weeks    Status New                 Plan - 09/23/20 1123    Clinical Impression Statement Pt tolerated session well. Pt was able to walk about 5 feetx 3 with max A (parallel bars plus PTassisting 100% with R LE adavancement). pt able to activate hamstrings better compared to quads with wheelchair push and pull.    Personal Factors and Comorbidities Comorbidity 3+;Time since onset of injury/illness/exacerbation;Social Background    Comorbidities Hx of CVA in 2014 and 2017, has not amb. since OPPT rehab in 2018, seizures, HTN, hx of ETOH and cocaine abuse, HLD    Examination-Activity Limitations Bed Mobility;Bathing;Bend;Carry;Dressing;Hygiene/Grooming;Lift;Stand;Toileting;Squat;Sleep;Self Feeding;Locomotion Level;Transfers;Reach Overhead    Examination-Participation  Restrictions Meal Prep;Driving;Interpersonal Relationship;Laundry;Medication Management;Community Activity    Stability/Clinical Decision Making Stable/Uncomplicated    Rehab  Potential Fair    PT Frequency 2x / week    PT Duration 8 weeks    PT Treatment/Interventions ADLs/Self Care Home Management;Electrical Stimulation;DME Instruction;Gait training;Functional mobility training;Therapeutic activities;Therapeutic exercise;Balance training;Neuromuscular re-education;Manual techniques;Wheelchair mobility training;Orthotic Fit/Training;Patient/family education;Cognitive remediation;Vestibular    PT Next Visit Plan LTGs due next week. Continue to work on transfers, sit<>stand in [place] or in parallel bars.  Keep trying sit<>stand at sink and educate wife when appropriate to begin to do at home.  WB through RLE, timing and safety with sit > stand and transfers.    PT Home Exercise Plan Access Code: [email or web address]    Consulted and Agree with Plan of Care Patient;Family member/caregiver    Family Member Consulted [name]: wife           Patient will benefit from skilled therapeutic intervention in order to improve the following deficits and impairments:  Difficulty walking,Decreased coordination  Visit Diagnosis: Other abnormalities of gait and mobility  Muscle weakness (generalized)  Unsteadiness on feet     Problem List Patient Active Problem List   Diagnosis Date Noted  . History of stroke 06/09/2017  . Seizure ([place]) 05/22/2016  . Anxiety state 04/20/2016  . Spastic hemiplegia affecting dominant side ([place]) 09/11/2015  . Infection of wound due to methicillin resistant Staphylococcus aureus (MRSA)   . 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 Right hemiparesis ([place]) 07/08/2015  . Acute left ACA ischemic stroke ([place]) 07/08/2015  . Cerebrovascular accident (CVA) ([place])   . Stroke ([place]) 07/02/2015  .  Acute ischemic stroke ([place]) 07/02/2015  . CVA (cerebral infarction) 01/15/2013  . Essential hypertension 01/15/2013  . Nicotine dependence 01/15/2013  . Alcohol abuse 01/15/2013    Kerrie Pleasure 09/23/2020, 11:25 AM  [facility] [address] [place], [name], [address] Phone: [phone]   Fax:  [phone]  Name: Gerald [name] MRN: [id number] Date of Birth: 1960/12/02

## 2020-09-23 NOTE — Patient Instructions (Signed)
  Practice these sentences (SLOW and clear):  [employer]rendol[employer] Pat[employer]: I want a Whopper with large french fry and large Coke.   [employer]: I want a Big Mac with french fry and grape soda. I want large, please.   [employer]: I want two piece chicken dinner, with corn on the cob, okra, and a large grape soda.
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
## 2020-09-25 ENCOUNTER — Ambulatory Visit: Payer: PPO

## 2020-09-25 ENCOUNTER — Other Ambulatory Visit: Payer: Self-pay

## 2020-09-25 ENCOUNTER — Ambulatory Visit: Payer: PPO | Admitting: Occupational Therapy

## 2020-09-25 ENCOUNTER — Ambulatory Visit: Payer: PPO | Admitting: Physical Therapy

## 2020-09-25 ENCOUNTER — Encounter: Payer: Self-pay | Admitting: Occupational Therapy

## 2020-09-25 DIAGNOSIS — M6281 Muscle weakness (generalized): Secondary | ICD-10-CM

## 2020-09-25 DIAGNOSIS — I69351 Hemiplegia and hemiparesis following cerebral infarction affecting right dominant side: Secondary | ICD-10-CM | POA: Diagnosis not present

## 2020-09-25 DIAGNOSIS — R208 Other disturbances of skin sensation: Secondary | ICD-10-CM

## 2020-09-25 DIAGNOSIS — R41840 Attention and concentration deficit: Secondary | ICD-10-CM

## 2020-09-25 DIAGNOSIS — R482 Apraxia: Secondary | ICD-10-CM

## 2020-09-25 DIAGNOSIS — R278 Other lack of coordination: Secondary | ICD-10-CM

## 2020-09-25 DIAGNOSIS — R471 Dysarthria and anarthria: Secondary | ICD-10-CM

## 2020-09-25 DIAGNOSIS — R4701 Aphasia: Secondary | ICD-10-CM

## 2020-09-25 DIAGNOSIS — R41844 Frontal lobe and executive function deficit: Secondary | ICD-10-CM
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
## 2020-09-30 ENCOUNTER — Other Ambulatory Visit: Payer: Self-pay

## 2020-09-30 ENCOUNTER — Ambulatory Visit: Payer: PPO

## 2020-09-30 DIAGNOSIS — R41841 Cognitive communication deficit: Secondary | ICD-10-CM

## 2020-09-30 DIAGNOSIS — I69351 Hemiplegia and hemiparesis following cerebral infarction affecting right dominant side: Secondary | ICD-10-CM | POA: Diagnosis not present

## 2020-09-30 DIAGNOSIS — R471 Dysarthria and anarthria: Secondary | ICD-10-CM

## 2020-09-30 DIAGNOSIS — R4701 Aphasia: Secondary | ICD-10-CM
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
## 2020-09-30 NOTE — Patient Instructions (Signed)
  Things to say on telephone:  When are you coming back to visit?  I really liked the fruit and candy you brought me the other weekend.    Months of the Year:  January  February  March  April  May  June  July  August  September  October  November  December

## 2020-10-02 ENCOUNTER — Ambulatory Visit: Payer: PPO | Admitting: Occupational Therapy

## 2020-10-02 ENCOUNTER — Ambulatory Visit: Payer: PPO

## 2020-10-02 ENCOUNTER — Ambulatory Visit: Payer: PPO | Admitting: Physical Therapy

## 2020-10-02 ENCOUNTER — Other Ambulatory Visit: Payer: Self-pay

## 2020-10-02 ENCOUNTER — Encounter: Payer: Self-pay | Admitting: Physical Therapy

## 2020-10-02 DIAGNOSIS — I69322 Dysarthria following cerebral infarction: Secondary | ICD-10-CM

## 2020-10-02 DIAGNOSIS — R2681 Unsteadiness on feet: Secondary | ICD-10-CM

## 2020-10-02 DIAGNOSIS — I69351 Hemiplegia and hemiparesis following cerebral infarction affecting right dominant side: Secondary | ICD-10-CM | POA: Diagnosis not present

## 2020-10-02 DIAGNOSIS — M6281 Muscle weakness (generalized): Secondary | ICD-10-CM

## 2020-10-02 DIAGNOSIS — R2689 Other abnormalities of gait and mobility: Secondary | ICD-10-CM

## 2020-10-02 NOTE — Patient Instructions (Signed)
  While sitting in your wheelchair, have both feet in front of you on the floor.  -Dig your heels in to pull the wheelchair forward, then  -Push through your feet to push the wheelchair back  -Repeat this 10 times, 3 sets.  -You can do this several times each day.

## 2020-10-02 NOTE — Therapy (Signed)
[facility] [address] [place], [name], [address] Phone: [phone]   Fax:  [phone]  Physical Therapy Treatment/Recert Visit  Patient Details  Name: Gerald [name] MRN: [id number] Date of Birth: 1960/12/27 Referring Provider (PT): Frann Rider, NP   Encounter Date: 10/02/2020   PT End of Session - 10/02/20 1146    Visit Number 15    Number of Visits 23    Date for PT Re-Evaluation 27/78/24   90 day cert for 5 wk POC; pt will be likely placed on hold after wk of 4/26 and then return when he received his new manual w/c   Authorization Type [employer] co-pay if visits on same day.    Progress Note Due on Visit --    PT Start Time 1018    PT Stop Time 1058    PT Time Calculation (min) 40 min    Equipment Utilized During Treatment Gait belt;Other (comment)   Pt's R AFO; wife has helped him don it prior to session   Activity Tolerance Patient tolerated treatment well    Behavior During Therapy [facility] for tasks assessed/performed           Past Medical History:  Diagnosis Date  . Alcohol abuse   . Cocaine abuse ([place]) 2014  . Hypertension   . Seizures ([place]) 07/2015   had first seizures about 6 months after stroke  . Stroke [facility]) 2014   denies residual on 07/03/2015  . Stroke ([place]) 07/02/2015   "now weak on right side; speech problems" (07/03/2015)  . Tobacco use     Past Surgical History:  Procedure Laterality Date  . COLONOSCOPY WITH PROPOFOL N/A 03/02/2019   Procedure: COLONOSCOPY WITH PROPOFOL;  Surgeon: Carol Ada, MD;  [facility]: [facility];  Service: Endoscopy;  Laterality: N/A;  . ESOPHAGOGASTRODUODENOSCOPY (EGD) WITH PROPOFOL N/A 03/23/2019   Procedure: ESOPHAGOGASTRODUODENOSCOPY (EGD) WITH PROPOFOL;  Surgeon: Carol Ada, MD;  [facility];  Service: Endoscopy;  Laterality: N/A;  . POLYPECTOMY  03/02/2019   Procedure: POLYPECTOMY;  Surgeon: Carol Ada, MD;  [facility]OS[facility];  Service: Endoscopy;;  . Azzie Almas DILATION N/A 03/23/2019   Procedure: Azzie Almas DILATION;  Surgeon: Carol Ada, MD;  [facility];  Service: Endoscopy;  Laterality: N/A;  . SKIN GRAFT Bilateral 1980s   "got burned by some hot water"    There were no vitals filed for this visit.   Subjective Assessment - 10/02/20 1023    Subjective No falls, have been working on standing and using leg in w/c at home.  Wife wants to make sure we can do some therapy once his new w/c comes in.    Patient is accompained by: Family member   [name]   Pertinent History Hx of CVA in 2014 and 2017, has not amb. since OPPT rehab in 2018, seizures, HTN, hx of ETOH and cocaine abuse, HLD    Limitations Walking;Standing    How long can you sit comfortably? unlimited    How long can you stand comfortably? 1 min    How long can you walk comfortably? n/a    Patient Stated Goals Be better at everything: txfs, amb, go to the bathroom.    Currently in Pain? No/denies    Pain Onset More than a month ago                             [facility] Adult PT Treatment/Exercise -  10/02/20 0001      Transfers   Transfers Sit to Stand;Stand to Sit    Sit to Stand 4: Min assist    Sit to Stand Details Verbal cues for technique    Sit to Stand Details (indicate cue type and reason) in parallel bars, BUEs pulling up from bars; cues for forward lean and upright stand.    Stand to Sit 4: Min assist    Stand to Sit Details (indicate cue type and reason) Verbal cues for technique    Stand to Sit Details VCs to control descent into sitting; BUEs holding parallel bars      Therapeutic Activites    Therapeutic Activities Other Therapeutic Activities    Other Therapeutic Activities 60 ft of "kick and dig" with BLEs, assist given to advance RLE, to engage R quads/hamstrings.  Cues to slow pace of how fast LLE kicks/digs to help.  Seated forward/back push/pull in w/c, activating hamstrings, with BLEs flat  on floor in front of w/c, 2 sets x 10 reps.      Neuro Re-ed    Neuro Re-ed Details  Standing in parallel bars: 1st trial 1 min with visual, verbal and tactile cues to try to shift a little more to right. Then 2nd trial, cues for upright posture, increased weightshift to RLE, then LLE hip/knee flexion, 10 reps, 2 sets.  Cues to increase L step height and slow pace of lifting.  PT provides tactile cues and blocking behind R knee.  Then a third bout of standing-cues for upright posture, increased weightshift to RLE, with LLE forward/back step, 2 sets x 5 reps.  PT provides assist to block R knee.  Almost buckling noted at end of standing bouts.  Wearing bootie covering R shoe, PT assists with weightshifting to L and manually progresses RLE, then weightshift to R side, pt able to take step-to pattern with gait.  x 5 steps in parallel bars.                  PT Education - 10/02/20 1144    Education Details Discussed progress towards goals and POC.  Discussed recert completion today to capture next week's appts and then place pt on hold until his new w/c comes in for additional education/practice in transfers with his new w/c.  Update to HEP    Person(s) Educated Patient;Spouse    Methods Explanation;Handout;Demonstration    Comprehension Verbalized understanding;Returned demonstration            PT Short Term Goals - 08/28/20 1452      PT SHORT TERM GOAL #1   Title Pt will perform HEP with min A to improve strength, balance, flexibility. TARGET DATE FOR ALL STGS:08/28/20    Baseline inconsistently performing HEP, per report 08/28/2020    Time 4    Period Weeks    Status Not Met      PT SHORT TERM GOAL #2   Title Pt will perform w/c<>mat squat pivot txfs with min A to improve safety during funcitonal mobility at home.    Baseline Mod-max A; at least mod assist; at times min assist with consistent cues and repetition    Time 4    Period Weeks    Status Not Met      PT SHORT TERM GOAL  #3   Title Trial amb. when appropriate and write goals prn.    Baseline Pt with decreased activation of RLE musculature; have not tried ambulation at this time  Time 4    Period Weeks    Status Deferred      PT SHORT TERM GOAL #4   Title Pt will be able to stand with UE support for 30 seconds and min guard in order to perform ADLs.    Baseline 30 sec - up to 2 minutes at counter    Time 4    Period Weeks    Status Achieved             PT Long Term Goals - 10/02/20 1148      PT LONG TERM GOAL #1   Title Pt will perform updated HEP with family supervision for improved strength, balance and gait. TARGET DATE FOR ALL LTGS: 09/26/2020    Baseline additions provided 10/02/20 and pt return demo understanding    Time 8    Period Weeks    Status Achieved      PT LONG TERM GOAL #2   Title Pt will perform stand pivot w/c<>mat txfs with min guard to improve safety during functional mobility.    Baseline min/mod assistance    Time 8    Period Weeks    Status Not Met      PT LONG TERM GOAL #3   Title Pt will perform sit<>stand txfs with min guard x5 reps to improve safety at home.    Baseline min assist, 5 reps    Time 8    Period Weeks    Status Partially Met      PT LONG TERM GOAL #4   Title Pt will stand at sink for 60 seconds with S in order to perform ADLs.    Baseline 1-2 minutes at sink/parallel bars; initially and with fatigue needs more than supervision    Time 8    Period Weeks    Status Partially Met            New LTGs:  PT Long Term Goals - 10/02/20 1208      PT LONG TERM GOAL #1   Title Pt will perform updated/final HEP with family supervision for improved strength, balance and gait. TARGET DATE FOR ALL LTGS: 11/07/2020 (may be extended, as pt will be on hold until new w/c arrives)    Baseline additions provided 10/02/20 and pt return demo understanding    Time 5    Period Weeks    Status Revised      PT LONG TERM GOAL #2   Title Pt will perform stand  pivot w/c<>mat txfs with min guard to improve safety during functional mobility.    Baseline min/mod assistance from current w/c    Time 5    Period Weeks    Status On-going      PT LONG TERM GOAL #3   Title Pt will perform sit<>stand txfs with min assist/stand at sink for 1 minute, 3 reps, at least 3x/wk at home with wife's assistance.    Baseline min assist, 5 reps    Time 5    Period Weeks    Status Revised      PT LONG TERM GOAL #4   Title Pt/wife will verbalize understanding of safety with transfer technique using new manual w/c, for improved transfers in the home.    Baseline current w/c is in disrepair and limits pt's improved independence with transfers.    Time 5    Period Weeks    Status New  Plan - 10/02/20 1154    Clinical Impression Statement Assessed LTGs for recert completed today.  Pt has met LTG 1 for HEP; LTG 2 not met, as he needs min/mod assist for stand pivot transfers.  LTG 3 and 4 partially met, as he needs min assist for transfers and at times more than supervision with standing.  In last several sessions, pt has been able to progress to standing and pre-gait activities in parallel bars.  He has difficulty shifting weight and maintaining weight through RLE long enough to advance LLE beyond R for step through pattern.  PT continues to assist RLE fully for advancement.  Discussed with pt and wife that pt is making slow, steady progress, limited somewhat by several missed appts (PT out unexpectedly and appts had to be cancelled) and pt continue to have decreased muscle activation/contraction RLE.  Will recert to follow patient for additional visits to address updates to HEP and standing program and then place pt on hold/pt return to therapy once his new w/c arrives for additional transfer instruction in new, safer, more appropriate w/c.    Personal Factors and Comorbidities Comorbidity 3+;Time since onset of injury/illness/exacerbation;Social  Background    Comorbidities Hx of CVA in 2014 and 2017, has not amb. since OPPT rehab in 2018, seizures, HTN, hx of ETOH and cocaine abuse, HLD    Examination-Activity Limitations Bed Mobility;Bathing;Bend;Carry;Dressing;Hygiene/Grooming;Lift;Stand;Toileting;Squat;Sleep;Self Feeding;Locomotion Level;Transfers;Reach Overhead    Examination-Participation Restrictions Meal Prep;Driving;Interpersonal Relationship;Laundry;Medication Management;Community Activity    Stability/Clinical Decision Making Stable/Uncomplicated    Rehab Potential Fair    PT Frequency 2x / week    PT Duration Other (comment)   5 weeks, including week of 4/48/18 recert visit   PT Treatment/Interventions ADLs/Self Care Home Management;Electrical Stimulation;DME Instruction;Gait training;Functional mobility training;Therapeutic activities;Therapeutic exercise;Balance training;Neuromuscular re-education;Manual techniques;Wheelchair mobility training;Orthotic Fit/Training;Patient/family education;Cognitive remediation;Vestibular    PT Next Visit Plan LTGs due next week. Continue to work on transfers, sit<>stand in [place] or in parallel bars.  Keep trying sit<>stand at sink and educate wife when appropriate to begin to do at home.  WB through RLE, timing and safety with sit > stand and transfers.  Will place pt on hold after wk of 10/07/20 and return when he gets new w/c.    PT Home Exercise Plan Access Code: [email or web address]    Consulted and Agree with Plan of Care Patient;Family member/caregiver    Family Member Consulted Gerald [name]: wife           Patient will benefit from skilled therapeutic intervention in order to improve the following deficits and impairments:  Difficulty walking,Decreased coordination  Visit Diagnosis: Unsteadiness on feet  Muscle weakness (generalized)  Other abnormalities of gait and mobility     Problem List Patient Active Problem List   Diagnosis Date Noted  . History of stroke 06/09/2017  . Seizure  ([place]) 05/22/2016  . Anxiety state 04/20/2016  . Spastic hemiplegia affecting dominant side ([place]) 09/11/2015  . Infection of wound due to methicillin resistant Staphylococcus aureus (MRSA)   . 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 Right hemiparesis ([place]) 07/08/2015  . Acute left ACA ischemic stroke ([place]) 07/08/2015  . Cerebrovascular accident (CVA) ([place])   . Stroke ([place]) 07/02/2015  . Acute ischemic stroke ([place]) 07/02/2015  . CVA (cerebral infarction) 01/15/2013  . Essential hypertension 01/15/2013  . Nicotine dependence 01/15/2013  . Alcohol abuse 01/15/2013    Gerald Mccord W. 10/02/2020, 12:05 PM  [facility] But[facility]., PT  [facility] [address] [place], [name], [address] Phone: [phone]   Fax:  [phone]  Name: Gerald [name] MRN: [id number] Date of Birth: 1960/06/19

## 2020-10-03 NOTE — Therapy (Signed)
[facility] [address] [place], [name], [address] Phone: [phone]   Fax:  [phone]  Patient Details  Name: Gerald [name] MRN: [id number] Date of Birth: [birth year] Referring Provider:  Frann Rider NP  Encounter Date: 10/02/2020  Speech Arrival/Cancellation Note:  Pt arrived to OP clinic; however, pt needed to leave prior to ST session. Continue per POC next scheduled session.   Alinda Deem 10/03/2020, 11:47 AM  [facility] [address] [place], [name], [address] Phone: [phone]   Fax:  [phone]

## 2020-10-07 ENCOUNTER — Encounter: Payer: Self-pay | Admitting: Occupational Therapy

## 2020-10-07 ENCOUNTER — Ambulatory Visit: Payer: PPO | Admitting: Occupational Therapy

## 2020-10-07 ENCOUNTER — Ambulatory Visit: Payer: PPO | Admitting: Physical Therapy

## 2020-10-07 ENCOUNTER — Other Ambulatory Visit: Payer: Self-pay

## 2020-10-07 ENCOUNTER — Ambulatory Visit: Payer: PPO

## 2020-10-07 ENCOUNTER — Encounter: Payer: Self-pay | Admitting: Physical Therapy

## 2020-10-07 DIAGNOSIS — R41844 Frontal lobe and executive function deficit: Secondary | ICD-10-CM

## 2020-10-07 DIAGNOSIS — M6281 Muscle weakness (generalized): Secondary | ICD-10-CM

## 2020-10-07 DIAGNOSIS — R2681 Unsteadiness on feet: Secondary | ICD-10-CM

## 2020-10-07 DIAGNOSIS — R208 Other disturbances of skin sensation: Secondary | ICD-10-CM

## 2020-10-07 DIAGNOSIS — R471 Dysarthria and anarthria: Secondary | ICD-10-CM

## 2020-10-07 DIAGNOSIS — R278 Other lack of coordination: Secondary | ICD-10-CM

## 2020-10-07 DIAGNOSIS — R4701 Aphasia: Secondary | ICD-10-CM

## 2020-10-07 DIAGNOSIS — I69351 Hemiplegia and hemiparesis following cerebral infarction affecting right dominant side: Secondary | ICD-10-CM

## 2020-10-07 DIAGNOSIS — R482 Apraxia: Secondary | ICD-10-CM

## 2020-10-07 NOTE — Patient Instructions (Signed)
  My address is [address], [place], [place] Varney Baas number is [phone]  Months of the Year:  January  February  March  April May  June  July August September  October November December

## 2020-10-07 NOTE — Therapy (Signed)
[facility] [address] [place], [name], [address] Phone: [phone]   Fax:  [phone]  Occupational Therapy Treatment  Patient Details  Name: Gerald [name] MRN: [id number] Date of Birth: 1960-12-22 Referring Provider (OT): Frann Rider , NP   Encounter Date: 10/07/2020   OT End of Session - 10/07/20 1105    Visit Number 14    Number of Visits 25    Date for OT Re-Evaluation 10/23/20    Authorization Type [employer]    Authorization Time Period 90 day anticpate -d/c after 8 weeks    Authorization - Visit Number 14    Authorization - Number of Visits 20    Progress Note Due on Visit 20    OT Start Time 1103    OT Stop Time 1145    OT Time Calculation (min) 42 min    Activity Tolerance Patient tolerated treatment well    Behavior During Therapy [facility] for tasks assessed/performed           Past Medical History:  Diagnosis Date  . Alcohol abuse   . Cocaine abuse ([place]) 2014  . Hypertension   . Seizures ([place]) 07/2015   had first seizures about 6 months after stroke  . Stroke [facility]) 2014   denies residual on 07/03/2015  . Stroke ([place]) 07/02/2015   "now weak on right side; speech problems" (07/03/2015)  . Tobacco use     Past Surgical History:  Procedure Laterality Date  . COLONOSCOPY WITH PROPOFOL N/A 03/02/2019   Procedure: COLONOSCOPY WITH PROPOFOL;  Surgeon: Carol Ada, MD;  [facility];  Service: Endoscopy;  Laterality: N/A;  . ESOPHAGOGASTRODUODENOSCOPY (EGD) WITH PROPOFOL N/A 03/23/2019   Procedure: ESOPHAGOGASTRODUODENOSCOPY (EGD) WITH PROPOFOL;  Surgeon: Carol Ada, MD;  [facility];  Service: Endoscopy;  Laterality: N/A;  . POLYPECTOMY  03/02/2019   Procedure: POLYPECTOMY;  Surgeon: Carol Ada, MD;  [facility];  Service: Endoscopy;;  . Azzie Almas DILATION N/A 03/23/2019   Procedure: Azzie Almas DILATION;  Surgeon: Carol Ada, MD;  [facility];   Service: Endoscopy;  Laterality: N/A;  . SKIN GRAFT Bilateral 1980s   "got burned by some hot water"    There were no vitals filed for this visit.   Subjective Assessment - 10/07/20 1107    Subjective  pt reports that he has tried to eat with RUE some.  Wife reports that pt has been out of Baclofen since last week and may be why he's tighter today.    Pertinent History Gerald [name] is a 60 y.o. male with history of ischemic stroke in 08/2015 resulting in right hemiparesis, dysarthria and seizure disorder.  PMH: alcohol abuse,HTN, seizure, CVA. Pt is using bacholphen fo spasticity, pr previously received botox, however spasticity is worse now that he is not receiving botox.    Patient Stated Goals to get better    Currently in Pain? No/denies            Closed-chain shoulder flex with pool noodle to target with min visual and verbal cues.  Checked shoulder flex and elbow ext ROM--goals met.  Flipping cards with mod difficulty and cueing (verbal and tactile for release).  Picking up blocks and placing in container with min difficulty initially with release, improved with repetition and min verbal And tactile cues.  Arm bike x5 min level 1 for reciprocal movement without rest, min cueing for incr elbow ext.  Mid-range functional reaching to pick up checkers and place in connect  4 slots with min cueing (stop-restart) when he demo difficulty releasing.    Mid range functional reach forward/laterally to grasp/release cones with min verbal and tactile cues.  Opening/closing bottles and jar with min difficulty.  Picking up small pegs and placing in pill box sorting by color with min difficulty, min v.c. for min difficulty with release         OT Short Term Goals - 09/16/20 1419      OT SHORT TERM GOAL #1   Title Pt will perform initial HEP with min cueing. 08/29/20    Time 4    Period Weeks    Status Achieved    Target Date 08/29/20      OT SHORT TERM GOAL #2   Title Pt will  use RUE as a gross assist/stabilizer at least 25% of the time.    Baseline uses 5%    Time 4    Period Weeks    Status Achieved   10% or less  09/16/20:  25%     OT SHORT TERM GOAL #3   Title Pt will demo at least 55 * shoulder flex for functional reach    Baseline 45    Time 4    Period Weeks    Status Achieved   80  with mod compensation     OT SHORT TERM GOAL #4   Title Pt will demonstrate ability to grasp and relase a 1 inch block into container 3/5 trials    Time 4    Period Weeks    Status Achieved   6/6 today     OT SHORT TERM GOAL #5   Title --             OT Long Term Goals - 10/07/20 1109      OT LONG TERM GOAL #1   Title Pt/caregiver will be independent with updated HEP.-10/23/20    Status On-going      OT LONG TERM GOAL #2   Title Pt will demo at least 65* shoulder flex for functional reach .    Status Achieved   10/07/20:  65-70* with elbow extension     OT LONG TERM GOAL #3   Title Pt will use RUE as a gross A for ADLS at least 63% of the time.    Status On-going      OT LONG TERM GOAL #4   Title Pt will demonstrate A/ROM  elbow extension -50 for increased functional reach.    Status Achieved   10/07/20:  -40*     OT LONG TERM GOAL #5   Title Pt will be able to consistently release object in R hand 4/5 trials to place in container    Status On-going      OT LONG TERM GOAL #6   Title Pt will perform toilet transfers with min A    Status On-going   mod A, pt's wife returned demonstration                Plan - 10/07/20 1114    Clinical Impression Statement Pt demo improved RUE ROM and is progressing towards goals, but incr spasticity today (has been out of meds) with incr difficulty with release of objects.    OT Occupational Profile and History Detailed Assessment- Review of Records and additional review of physical, cognitive, psychosocial history related to current functional performance    Occupational performance deficits (Please refer to  evaluation for details): ADL's;IADL's;Leisure;Social Participation    Body  Structure / Function / Physical Skills ADL;UE functional use;Muscle spasms;Endurance;Balance;Flexibility;Pain;FMC;ROM;Gait;Coordination;GMC;Sensation;Decreased knowledge of precautions;Decreased knowledge of use of DME;IADL;Dexterity;Strength;Mobility;Tone    Cognitive Skills Attention;Memory;Problem Solve;Safety Awareness;Thought;Understand    Rehab Potential Good    OT Frequency 2x / week    OT Duration 12 weeks    OT Treatment/Interventions Self-care/ADL training;Ultrasound;Visual/perceptual remediation/compensation;DME and/or AE instruction;Scar mobilization;Patient/family education;Balance training;Passive range of motion;Paraffin;Gait Training;Cryotherapy;Fluidtherapy;Splinting;Functional Mobility Training;Moist Heat;Therapeutic exercise;Manual Therapy;Therapeutic activities;Cognitive remediation/compensation;Neuromuscular education    Plan continue to work towards toilet transfers, functional use of RUE, work towards long term goals    Consulted and Agree with Plan of Care Patient;Family member/caregiver    Family Member Consulted Wife [name]           Patient will benefit from skilled therapeutic intervention in order to improve the following deficits and impairments:   Body Structure / Function / Physical Skills: ADL,UE functional use,Muscle spasms,Endurance,Balance,Flexibility,Pain,FMC,ROM,Gait,Coordination,GMC,Sensation,Decreased knowledge of precautions,Decreased knowledge of use of DME,IADL,Dexterity,Strength,Mobility,Tone Cognitive Skills: Attention,Memory,Problem Solve,Safety Awareness,Thought,Understand     Visit Diagnosis: Other lack of coordination  Spastic hemiplegia of right dominant side as late effect of cerebral infarction (HCC)  Other disturbances of skin sensation  Frontal lobe and executive function deficit  Unsteadiness on feet    Problem List Patient Active Problem List    Diagnosis Date Noted  . History of stroke 06/09/2017  . Seizure ([place]) 05/22/2016  . Anxiety state 04/20/2016  . Spastic hemiplegia affecting dominant side ([place]) 09/11/2015  . Infection of wound due to methicillin resistant Staphylococcus aureus (MRSA)   . 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 Right hemiparesis ([place]) 07/08/2015  . Acute left ACA ischemic stroke ([place]) 07/08/2015  . Cerebrovascular accident (CVA) ([employer])   . Stroke ([place]) 07/02/2015  . Acute ischemic stroke ([place]) 07/02/2015  . CVA (cerebral infarction) 01/15/2013  . Essential hypertension 01/15/2013  . Nicotine dependence 01/15/2013  . Alcohol abuse 01/15/2013    [facility] 10/07/2020, 12:11 PM  [facility] [address] [place], [name], [address] Phone: [phone]   Fax:  [phone]  Name: NICKY KRAS MRN: [id number] Date of Birth: 17-Feb-1961   Vianne Bulls, OTR/L [facility] [address] [place], [place]  [address] [phone] phone [phone] 10/07/20 12:11 PM

## 2020-10-07 NOTE — Therapy (Signed)
[facility] [address] [place], [name], [address] Phone: [phone]   Fax:  [phone]  Physical Therapy Treatment  Patient Details  Name: Gerald [name] MRN: [id number] Date of Birth: [birth year] Referring Provider (PT): Frann Rider, NP   Encounter Date: 10/07/2020   PT End of Session - 10/07/20 1021    Visit Number 16    Number of Visits 23    Date for PT Re-Evaluation 38/25/05   90 day cert for 5 wk POC; pt will be likely placed on hold after wk of 4/26 and then return when he received his new manual w/c   Authorization Type [employer] co-pay if visits on same day.    PT Start Time 1019    PT Stop Time 1059    PT Time Calculation (min) 40 min    Equipment Utilized During Treatment Gait belt;Other (comment)   Pt's R AFO; wife has helped him don it prior to session   Activity Tolerance Patient tolerated treatment well    Behavior During Therapy [facility] for tasks assessed/performed           Past Medical History:  Diagnosis Date  . Alcohol abuse   . Cocaine abuse ([place]) 2014  . Hypertension   . Seizures ([place]) 07/2015   had first seizures about 6 months after stroke  . Stroke [facility]) 2014   denies residual on 07/03/2015  . Stroke ([place]) 07/02/2015   "now weak on right side; speech problems" (07/03/2015)  . Tobacco use     Past Surgical History:  Procedure Laterality Date  . COLONOSCOPY WITH PROPOFOL N/A 03/02/2019   Procedure: COLONOSCOPY WITH PROPOFOL;  Surgeon: Carol Ada, MD;  Location: [facility];  Service: Endoscopy;  Laterality: N/A;  . ESOPHAGOGASTRODUODENOSCOPY (EGD) WITH PROPOFOL N/A 03/23/2019   Procedure: ESOPHAGOGASTRODUODENOSCOPY (EGD) WITH PROPOFOL;  Surgeon: Carol Ada, MD;  Location: [facility];  Service: Endoscopy;  Laterality: N/A;  . POLYPECTOMY  03/02/2019   Procedure: POLYPECTOMY;  Surgeon: Carol Ada, MD;  [facility]: [facility];  Service: Endoscopy;;  . Azzie Almas DILATION  N/A 03/23/2019   Procedure: Azzie Almas DILATION;  Surgeon: Carol Ada, MD;  [facility];  Service: Endoscopy;  Laterality: N/A;  . SKIN GRAFT Bilateral 1980s   "got burned by some hot water"    There were no vitals filed for this visit.   Subjective Assessment - 10/07/20 1020    Subjective Been standing at the sink about twice a day with wife's assist for a few minutes.    Patient is accompained by: --    Pertinent History Hx of CVA in 2014 and 2017, has not amb. since OPPT rehab in 2018, seizures, HTN, hx of ETOH and cocaine abuse, HLD    Limitations Walking;Standing    How long can you sit comfortably? unlimited    How long can you stand comfortably? 1 min    How long can you walk comfortably? n/a    Patient Stated Goals Be better at everything: txfs, amb, go to the bathroom.    Currently in Pain? No/denies    Pain Onset More than a month ago                             [facility] Adult PT Treatment/Exercise - 10/07/20 0001      Transfers   Transfers Sit to Stand;Stand to Sit    Sit to Stand 4: Min assist    Sit  to Stand Details Verbal cues for technique    Sit to Stand Details (indicate cue type and reason) At sink, pt pulls up to stand at sink, then in parallel bars, BUEs pulling up from bars.  Cues for forward lean and upright stand, to tuck in buttocks upon standing.    Stand to Sit 4: Min assist;Uncontrolled descent;To chair/3-in-1    Stand to Sit Details (indicate cue type and reason) Verbal cues for technique    Stand to Sit Details VCs and assist to control descent all the way into sitting      Therapeutic Activites    Therapeutic Activities Other Therapeutic Activities    Other Therapeutic Activities 60 ft of "kick and dig" with BLEs, assist given to advance RLE, to engage R quads/hamstrings.  Cues to slow pace of how fast LLE kicks/digs to help.  Seated forward/back push/pull in w/c, activating hamstrings, with BLEs flat on floor in front of w/c, 2  sets x 10 reps.  Review of addition to HEP, and pt return demo understanding, but reports he hasn't done them.      Neuro Re-ed    Neuro Re-ed Details  Stand at sink x 3 reps, 2 minutes x 2, then 1 minute last bout, with BUE support, supervision, with cues for glut set, increased weightshift to RLE.  Standing in parallel bars: 3 reps of standing.  1st and 2nd trials, cues for upright posture, increased weightshift to RLE, then LLE hip/knee flexion, 5 reps, 3 sets.  3rd trial:  Attempted to kick forward x 3 reps with LLE, with difficulty.  PT provides tactile cues and blocking behind R knee.  With increased weightbearing through RLE, pt experiences multiple bouts of Clonus.  Cues for deep breathing, for relaxed position of RLE and lateral weightshifting to try to lessen Clonus, which it does in therapy session working on this.      Exercises   Exercises Other Exercises    Other Exercises  Review of seated hamstring stretch and seated gastroc stretch as part of HEP, with pt needing assist for positioning.           Performed with therapist assist for positioning:  Seated Hamstring stretch - 1 x daily - 7 x weekly - 1 sets - 3 reps - 30 sec hold Seated Ankle Dorsiflexion Stretch - 1 x daily - 7 x weekly - 1 sets - 5 reps - 10-15 sec hold              PT Long Term Goals - 10/02/20 1208      PT LONG TERM GOAL #1   Title Pt will perform updated/final HEP with family supervision for improved strength, balance and gait. TARGET DATE FOR ALL LTGS: 11/07/2020 (may be extended, as pt will be on hold until new w/c arrives)    Baseline additions provided 10/02/20 and pt return demo understanding    Time 5    Period Weeks    Status Revised      PT LONG TERM GOAL #2   Title Pt will perform stand pivot w/c<>mat txfs with min guard to improve safety during functional mobility.    Baseline min/mod assistance from current w/c    Time 5    Period Weeks    Status On-going      PT LONG TERM GOAL  #3   Title Pt will perform sit<>stand txfs with min assist/stand at sink for 1 minute, 3 reps, at least 3x/wk at home with wife's  assistance.    Baseline min assist, 5 reps    Time 5    Period Weeks    Status Revised      PT LONG TERM GOAL #4   Title Pt/wife will verbalize understanding of safety with transfer technique using new manual w/c, for improved transfers in the home.    Baseline current w/c is in disrepair and limits pt's improved independence with transfers.    Time 5    Period Weeks    Status New                 Plan - 10/07/20 1240    Clinical Impression Statement Focus of today's skilled session, multiple bouts of sit<>stand transfers and standing trials at sink and in parallel bars.  Pt continue to demo difficulty with maintaining weightbearing through RLE for unweighting LLE.  He experiences Clonus several times and requests to sit, but with cues for relaxed breathing, upright posture and weightshifting/rocking, he is able to lessen the Clonus.  Plan for next visit to be the last visit until he returns when he has received his w/c.    Personal Factors and Comorbidities Comorbidity 3+;Time since onset of injury/illness/exacerbation;Social Background    Comorbidities Hx of CVA in 2014 and 2017, has not amb. since OPPT rehab in 2018, seizures, HTN, hx of ETOH and cocaine abuse, HLD    Examination-Activity Limitations Bed Mobility;Bathing;Bend;Carry;Dressing;Hygiene/Grooming;Lift;Stand;Toileting;Squat;Sleep;Self Feeding;Locomotion Level;Transfers;Reach Overhead    Examination-Participation Restrictions Meal Prep;Driving;Interpersonal Relationship;Laundry;Medication Management;Community Activity    Stability/Clinical Decision Making Stable/Uncomplicated    Rehab Potential Fair    PT Frequency 2x / week    PT Duration Other (comment)   5 weeks, including week of 7/62/83 recert visit   PT Treatment/Interventions ADLs/Self Care Home Management;Electrical Stimulation;DME  Instruction;Gait training;Functional mobility training;Therapeutic activities;Therapeutic exercise;Balance training;Neuromuscular re-education;Manual techniques;Wheelchair mobility training;Orthotic Fit/Training;Patient/family education;Cognitive remediation;Vestibular    PT Next Visit Plan Review exercises that are most appropriate for pt to continue at home; education with pt/wife for transfers next visit.  Will place pt on hold after wk of 10/07/20 and return when he gets new w/c.    PT Home Exercise Plan Access Code: [email or web address]    Consulted and Agree with Plan of Care Patient           Patient will benefit from skilled therapeutic intervention in order to improve the following deficits and impairments:  Difficulty walking,Decreased coordination  Visit Diagnosis: Muscle weakness (generalized)  Unsteadiness on feet     Problem List Patient Active Problem List   Diagnosis Date Noted  . History of stroke 06/09/2017  . Seizure ([place]) 05/22/2016  . Anxiety state 04/20/2016  . Spastic hemiplegia affecting dominant side ([place]) 09/11/2015  . Infection of wound due to methicillin resistant Staphylococcus aureus (MRSA)   . 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 Right hemiparesis ([place]) 07/08/2015  . Acute left ACA ischemic stroke ([place]) 07/08/2015  . Cerebrovascular accident (CVA) ([place])   . Stroke ([place]) 07/02/2015  . Acute ischemic stroke ([place]) 07/02/2015  . CVA (cerebral infarction) 01/15/2013  . Essential hypertension 01/15/2013  . Nicotine dependence 01/15/2013  . Alcohol abuse 01/15/2013    Frazier Butt. 10/07/2020, 12:45 PM  Frazier Butt., PT   [facility] [address] [place], [name], [address] Phone: [phone]   Fax:  [phone]  Name: Gerald [name] MRN: [id number] Date of Birth: 1960-10-29

## 2020-10-07 NOTE — Therapy (Signed)
[facility] [address] [place], [name], [address] Phone: [phone]   Fax:  [phone]  Speech Language Pathology Treatment  Patient Details  Name: Gerald [name] MRN: [id number] Date of Birth: [birth year] Referring Provider (SLP): Frann Rider NP   Encounter Date: 10/07/2020   End of Session - 10/07/20 1149    Visit Number 12    Number of Visits 17    Date for SLP Re-Evaluation 11/20/20    SLP Start Time 1150    SLP Stop Time  1230    SLP Time Calculation (min) 40 min    Activity Tolerance Patient tolerated treatment well           Past Medical History:  Diagnosis Date  . Alcohol abuse   . Cocaine abuse ([place]) 2014  . Hypertension   . Seizures ([place]) 07/2015   had first seizures about 6 months after stroke  . Stroke [facility]) 2014   denies residual on 07/03/2015  . Stroke ([place]) 07/02/2015   "now weak on right side; speech problems" (07/03/2015)  . Tobacco use     Past Surgical History:  Procedure Laterality Date  . COLONOSCOPY WITH PROPOFOL N/A 03/02/2019   Procedure: COLONOSCOPY WITH PROPOFOL;  Surgeon: Carol Ada, MD;  [facility];  Service: Endoscopy;  Laterality: N/A;  . ESOPHAGOGASTRODUODENOSCOPY (EGD) WITH PROPOFOL N/A 03/23/2019   Procedure: ESOPHAGOGASTRODUODENOSCOPY (EGD) WITH PROPOFOL;  Surgeon: Carol Ada, MD;  [facility];  Service: Endoscopy;  Laterality: N/A;  . POLYPECTOMY  03/02/2019   Procedure: POLYPECTOMY;  Surgeon: Carol Ada, MD;  [facility];  Service: Endoscopy;;  . Azzie Almas DILATION N/A 03/23/2019   Procedure: Azzie Almas DILATION;  Surgeon: Carol Ada, MD;  [facility];  Service: Endoscopy;  Laterality: N/A;  . SKIN GRAFT Bilateral 1980s   "got burned by some hot water"    There were no vitals filed for this visit.   Subjective Assessment - 10/07/20 1151    Subjective "they were fine"    Patient is accompained by: Family member    Gerald [name], wife   Currently in Pain? No/denies                 ADULT SLP TREATMENT - 10/07/20 1150      General Information   Behavior/Cognition Alert;Cooperative;Pleasant mood      Treatment Provided   Treatment provided Cognitive-Linquistic      Cognitive-Linquistic Treatment   Treatment focused on Aphasia;Dysarthria;Patient/family/caregiver education    Skilled Treatment Pt reported some frustration re: reading sentences as HEP. SLP provided occasional min A to slow rate while reading for improved intelligibility. In conversation re: favorite sport, pt presented with improved naming and increased MLU; however, usual prompting required to expand upon overall message. SLP targeted use of dialogue boxes with generation of specific message given picture stimuli, in which pt able to generate appropriate responses with min A for syntax and occasional mod A to create more specific response. Pt continues to struggle with some automatic speech tasks and verbalizing personal information due to anomia and perseverations, which improved with rote sequencing and errorless learning techniques      Assessment / Recommendations / [place] with current plan of care      Progression Toward Goals   Progression toward goals Progressing toward goals            SLP Education - 10/07/20 1437    Education Details functional practice, rote speech    Person(s)  Educated Patient;Spouse    Methods Explanation;Demonstration;Handout;Verbal cues    Comprehension Verbalized understanding;Returned demonstration;Need further instruction;Verbal cues required            SLP Short Term Goals - 09/23/20 1151      SLP SHORT TERM GOAL #1   Title Pt will complete HEP with min A over 3 sessions    Baseline 09-02-20, 09-09-20, 09-23-20    Period --   or 9 total visits for all STGs   Status Achieved      SLP SHORT TERM GOAL #2   Title Pt will verbally generate 10 items in personally relevant  categories with min A over 3 sessions    Baseline 09-04-20, 09-16-20, 09-18-20    Status Achieved      SLP SHORT TERM GOAL #3   Title Pt will produce sentence responses with compensations for 80% intelligibilty with occassional min A over 3 sessions    Baseline 09-02-20, 09-04-20, 09-18-20    Status Achieved      SLP SHORT TERM GOAL #4   Title Pt will use word finding compensations in simple 5 minute conversation with min A over 3 sessions    Baseline 09-23-20    Time --    Period --    Status Partially Met      SLP SHORT TERM GOAL #5   Title Pt will use mulitmodal communication (gesture, draw, write 1st letter etc) to augment verbal expression with min A over 3 sessions    Baseline 09-18-20, 09-23-20    Time --    Period --    Status Partially Met      SLP SHORT TERM GOAL #6   Title Pt's caregiver will complete qualitative communication rating scale in first 2 sessions    Baseline CES: 16 & Comm SF: 13    Status Achieved            SLP Long Term Goals - 10/07/20 1149      SLP LONG TERM GOAL #1   Title Pt will be 90% intelligible in 10 simple conversation with min A over 2 sessions    Time 2    Period Weeks   or 17 total visits for all LTGs   Status On-going      SLP LONG TERM GOAL #2   Title Pt will appropriately compensate for word finding versus interjecting "I don't know" for 8/10 opportunities across 3 sessions    Baseline 09-25-20    Time 2    Period Weeks    Status On-going      SLP LONG TERM GOAL #3   Title Pt will use mulitmodal communication (gesture, draw, write 1st letter etc) to augment verbal expression to meet needs at home with rare min A from family.    Baseline 09-30-20, 10-07-20    Time 2    Period Weeks    Status On-going      SLP LONG TERM GOAL #4   Title Caregiver will report improvements in communication effectiveness via QOL scale with > 3 point increase    Time 2    Period Weeks    Status On-going            Plan - 10/07/20 1556    Clinical  Impression Statement Gerald [name] presents with improvements in moderate expressive aphasia and dysarthria. SLP targeted spontaneous speech given picture stimuli of dialogue bubbles, with min to mod A required for syntax and to specifiy information. SLP provided intermittent cues to utilize dysarthria  compensations for reading and conversation. Pt stated "I don't know" when word finding occurred x5 this session, with overall improving awareness exhibited. Some perseverations and phonemic paraphasias noted during automatic speech tasks, requiring mod A fading to min A with use of errorless learning techniques and visual aids. See "skilled treatment" for additional details of today's session. Given presentation of mild to moderate aphasia, dysarthria, and possible cognitive changes, pt would benefit from skilled ST intervention to increase effectiveness of communication, reduce caregiver burden, and improve QOL.    Speech Therapy Frequency 2x / week    Duration 8 weeks   or 17 total visits   Treatment/Interventions Compensatory strategies;Functional tasks;Patient/family education;Cueing hierarchy;Cognitive reorganization;Multimodal communcation approach;Other (comment);Compensatory techniques;Internal/external aids;SLP instruction and feedback;Language facilitation    Potential to Achieve Goals Good    Potential Considerations Previous level of function;Severity of impairments    SLP Home Exercise Plan provided    Consulted and Agree with Plan of Care Patient;Family member/caregiver    Family Member Consulted wife, Gerald [name]           Patient will benefit from skilled therapeutic intervention in order to improve the following deficits and impairments:   Aphasia  Verbal apraxia  Dysarthria and anarthria    Problem List Patient Active Problem List   Diagnosis Date Noted  . History of stroke 06/09/2017  . Seizure ([place]) 05/22/2016  . Anxiety state 04/20/2016  . Spastic hemiplegia affecting dominant side  ([place]) 09/11/2015  . Infection of wound due to methicillin resistant Staphylococcus aureus (MRSA)   . 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 Right hemiparesis ([place]) 07/08/2015  . Acute left ACA ischemic stroke ([place]) 07/08/2015  . Cerebrovascular accident (CVA) ([place])   . Stroke ([place]) 07/02/2015  . Acute ischemic stroke ([place]) 07/02/2015  . CVA (cerebral infarction) 01/15/2013  . Essential hypertension 01/15/2013  . Nicotine dependence 01/15/2013  . Alcohol abuse 01/15/2013    Alinda Deem, MA CCC-SLP 10/07/2020, 3:59 PM  [facility] [address] [place], [name], [address] Phone: [phone]   Fax:  [phone]   Name: Gerald [name] MRN: [id number] Date of Birth: [birth year]

## 2020-10-09 ENCOUNTER — Encounter: Payer: Self-pay | Admitting: Physical Therapy

## 2020-10-09 ENCOUNTER — Ambulatory Visit: Payer: PPO | Admitting: Occupational Therapy

## 2020-10-09 ENCOUNTER — Other Ambulatory Visit: Payer: Self-pay

## 2020-10-09 ENCOUNTER — Encounter: Payer: Self-pay | Admitting: Occupational Therapy

## 2020-10-09 ENCOUNTER — Ambulatory Visit: Payer: PPO

## 2020-10-09 ENCOUNTER — Ambulatory Visit: Payer: PPO | Admitting: Physical Therapy

## 2020-10-09 DIAGNOSIS — R208 Other disturbances of skin sensation: Secondary | ICD-10-CM

## 2020-10-09 DIAGNOSIS — I69351 Hemiplegia and hemiparesis following cerebral infarction affecting right dominant side: Secondary | ICD-10-CM | POA: Diagnosis not present

## 2020-10-09 DIAGNOSIS — R482 Apraxia: Secondary | ICD-10-CM

## 2020-10-09 DIAGNOSIS — R41844 Frontal lobe and executive function deficit: Secondary | ICD-10-CM

## 2020-10-09 DIAGNOSIS — R4701 Aphasia: Secondary | ICD-10-CM

## 2020-10-09 DIAGNOSIS — M6281 Muscle weakness (generalized): Secondary | ICD-10-CM

## 2020-10-09 DIAGNOSIS — R278 Other lack of coordination: Secondary | ICD-10-CM

## 2020-10-09 DIAGNOSIS — R471 Dysarthria and anarthria: Secondary | ICD-10-CM

## 2020-10-09 DIAGNOSIS — R2681 Unsteadiness on feet: Secondary | ICD-10-CM
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
## 2020-10-10 NOTE — Therapy (Signed)
[facility] [address] [place], [name], [address] Phone: [phone]   Fax:  [phone]  Physical Therapy Treatment  Patient Details  Name: Gerald [name] MRN: [id number] Date of Birth: 03-09-1961 Referring Provider (PT): Frann Rider, NP   Encounter Date: 10/09/2020   PT End of Session - 10/09/20 1312    Visit Number 17    Number of Visits 23    Date for PT Re-Evaluation 74/25/95   90 day cert for 5 wk POC; pt will be likely placed on hold after wk of 4/26 and then return when he received his new manual w/c   Authorization Type [employer] co-pay if visits on same day.    PT Start Time 1233    PT Stop Time 1311    PT Time Calculation (min) 38 min    Equipment Utilized During Treatment Gait belt;Other (comment)   Pt's R AFO; wife has helped him don it prior to session   Activity Tolerance Patient tolerated treatment well    Behavior During Therapy [facility] for tasks assessed/performed           Past Medical History:  Diagnosis Date  . Alcohol abuse   . Cocaine abuse ([place]) 2014  . Hypertension   . Seizures ([place]) 07/2015   had first seizures about 6 months after stroke  . Stroke [facility]) 2014   denies residual on 07/03/2015  . Stroke ([place]) 07/02/2015   "now weak on right side; speech problems" (07/03/2015)  . Tobacco use     Past Surgical History:  Procedure Laterality Date  . COLONOSCOPY WITH PROPOFOL N/A 03/02/2019   Procedure: COLONOSCOPY WITH PROPOFOL;  Surgeon: Carol Ada, MD;  Location: [facility];  Service: Endoscopy;  Laterality: N/A;  . ESOPHAGOGASTRODUODENOSCOPY (EGD) WITH PROPOFOL N/A 03/23/2019   Procedure: ESOPHAGOGASTRODUODENOSCOPY (EGD) WITH PROPOFOL;  Surgeon: Carol Ada, MD;  Location: [facility];  Service: Endoscopy;  Laterality: N/A;  . POLYPECTOMY  03/02/2019   Procedure: POLYPECTOMY;  Surgeon: Carol Ada, MD;  Location: [facility];  Service: Endoscopy;;  . Azzie Almas DILATION  N/A 03/23/2019   Procedure: Azzie Almas DILATION;  Surgeon: Carol Ada, MD;  [facility]: [facility];  Service: Endoscopy;  Laterality: N/A;  . SKIN GRAFT Bilateral 1980s   "got burned by some hot water"    There were no vitals filed for this visit.   Subjective Assessment - 10/09/20 1234    Subjective Nothing new.  No pain.  Wife reports not standing much at the sink because the wheelchair isn't too sturdy.    Pertinent History Hx of CVA in 2014 and 2017, has not amb. since OPPT rehab in 2018, seizures, HTN, hx of ETOH and cocaine abuse, HLD    Limitations Walking;Standing    How long can you sit comfortably? unlimited    How long can you stand comfortably? 1 min    How long can you walk comfortably? n/a    Patient Stated Goals Be better at everything: txfs, amb, go to the bathroom.    Currently in Pain? No/denies    Pain Onset More than a month ago                             [facility] Adult PT Treatment/Exercise - 10/09/20 1235      Transfers   Transfers Stand Pivot Transfers    Stand Pivot Transfers 4: Min assist    Stand Pivot Transfer Details (indicate cue type  and reason) W/C <>Mat, with pt verbalizing to PT his transfer sequence prior to initiating.  Cues for pt/wife to make sure to verbalize to each other transfer sequence for optimal safety.      Therapeutic Activites    Other Therapeutic Activities 10 ft x 4 of "kick and dig" with BLEs, assist given to advance RLE, to engage R quads/hamstrings.  Cues to slow pace of how fast LLE kicks/digs to help.  Reviewed Seated forward/back push/pull in w/c, activating hamstrings, with BLEs flat on floor in front of w/c, 2 sets x 10 reps.  Discussed importance of doing these exercises daily at home.             Access Code: [email or web address] URL: [email or web address] Date: 10/09/2020 Prepared by: Mady Haagensen  Exercises-reviewed full HEP; assistance given at R hip for control/positioning  . Supine Hip  Adduction Isometric with Ball - 1 x daily - 5 x weekly - 1 sets - 10 reps - 5 hold . Supine Bridge - 1 x daily - 5 x weekly - 1 sets - 10 reps . Supine Heel Slide - 1 x daily - 5 x weekly - 1 sets - 10 reps  -assistance given by therapist; pt with increased extensor tone at end range; educated pt/wife to work knee flexion in smaller ranges . Bent Knee Fallouts - 1 x daily - 5 x weekly - 1 sets - 10 reps . Seated Hamstring stretch - 1 x daily - 7 x weekly - 1 sets - 3 reps - 30 sec hold  -assist given for positioning . Seated Ankle Dorsiflexion Stretch - 1 x daily - 7 x weekly - 1 sets - 5 reps - 10-15 sec hold  -educated how he could perform this with rocking w/c forward and hold at end range, performed x 3 this way.        PT Education - 10/10/20 0943    Education Details Full review of HEP and encouraged pt/wife to make sure he is consistently performing HEP    Person(s) Educated Patient;Spouse    Methods Explanation;Demonstration    Comprehension Verbalized understanding;Returned demonstration;Verbal cues required            PT Short Term Goals - 10/02/20 1207      PT SHORT TERM GOAL #1   Title =LTGs      PT SHORT TERM GOAL #2   Title --    Baseline --    Time --    Period --    Status --      PT SHORT TERM GOAL #3   Title --    Baseline --    Time --    Period --    Status --      PT SHORT TERM GOAL #4   Title --    Baseline --    Time --    Period --    Status --             PT Long Term Goals - 10/02/20 1208      PT LONG TERM GOAL #1   Title Pt will perform updated/final HEP with family supervision for improved strength, balance and gait. TARGET DATE FOR ALL LTGS: 11/07/2020 (may be extended, as pt will be on hold until new w/c arrives)    Baseline additions provided 10/02/20 and pt return demo understanding    Time 5    Period Weeks    Status Revised      PT LONG  TERM GOAL #2   Title Pt will perform stand pivot w/c<>mat txfs with min guard to  improve safety during functional mobility.    Baseline min/mod assistance from current w/c    Time 5    Period Weeks    Status On-going      PT LONG TERM GOAL #3   Title Pt will perform sit<>stand txfs with min assist/stand at sink for 1 minute, 3 reps, at least 3x/wk at home with wife's assistance.    Baseline min assist, 5 reps    Time 5    Period Weeks    Status Revised      PT LONG TERM GOAL #4   Title Pt/wife will verbalize understanding of safety with transfer technique using new manual w/c, for improved transfers in the home.    Baseline current w/c is in disrepair and limits pt's improved independence with transfers.    Time 5    Period Weeks    Status New                 Plan - 10/10/20 0945    Clinical Impression Statement Full review of HEP including seated and supine exercises.  Pt needs assistance with some exercises in supine and wife verbalizes/demo understanding of assistance needed.  Pt not consistently standing at sink at home, per wife report, due to unsteadiness of his current w/c.  Pt awaiting new manual w/c and plans to return to therapy upon recieving to follow up with additional instructions and exercises for continued home program an optimal functional mobility.    Personal Factors and Comorbidities Comorbidity 3+;Time since onset of injury/illness/exacerbation;Social Background    Comorbidities Hx of CVA in 2014 and 2017, has not amb. since OPPT rehab in 2018, seizures, HTN, hx of ETOH and cocaine abuse, HLD    Examination-Activity Limitations Bed Mobility;Bathing;Bend;Carry;Dressing;Hygiene/Grooming;Lift;Stand;Toileting;Squat;Sleep;Self Feeding;Locomotion Level;Transfers;Reach Overhead    Examination-Participation Restrictions Meal Prep;Driving;Interpersonal Relationship;Laundry;Medication Management;Community Activity    Stability/Clinical Decision Making Stable/Uncomplicated    Rehab Potential Fair    PT Frequency 2x / week    PT Duration Other  (comment)   5 weeks, including week of 7/82/95 recert visit   PT Treatment/Interventions ADLs/Self Care Home Management;Electrical Stimulation;DME Instruction;Gait training;Functional mobility training;Therapeutic activities;Therapeutic exercise;Balance training;Neuromuscular re-education;Manual techniques;Wheelchair mobility training;Orthotic Fit/Training;Patient/family education;Cognitive remediation;Vestibular    PT Next Visit Plan Will place pt on hold after wk of 10/07/20 and return when he gets new w/c.    PT Home Exercise Plan Access Code: [email or web address]    Consulted and Agree with Plan of Care Patient           Patient will benefit from skilled therapeutic intervention in order to improve the following deficits and impairments:  Difficulty walking,Decreased coordination  Visit Diagnosis: Muscle weakness (generalized)  Unsteadiness on feet     Problem List Patient Active Problem List   Diagnosis Date Noted  . History of stroke 06/09/2017  . Seizure ([place]) 05/22/2016  . Anxiety state 04/20/2016  . Spastic hemiplegia affecting dominant side ([place]) 09/11/2015  . Infection of wound due to methicillin resistant Staphylococcus aureus (MRSA)   . 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 Right hemiparesis ([place]) 07/08/2015  . Acute left ACA ischemic stroke ([place]) 07/08/2015  . Cerebrovascular accident (CVA) ([place])   . Stroke ([place]) 07/02/2015  . Acute ischemic stroke ([place]) 07/02/2015  . CVA (cerebral infarction) 01/15/2013  . Essential hypertension 01/15/2013  . Nicotine dependence 01/15/2013  .  Alcohol abuse 01/15/2013    Frazier Butt. 10/10/2020, 9:48 AM  Frazier Butt., PT   [facility] [address] [place], [name], [address] Phone: [phone]   Fax:  [phone]  Name: Gerald [name] MRN: [id number] Date of Birth:  31-Dec-1960

## 2020-10-14 ENCOUNTER — Other Ambulatory Visit: Payer: Self-pay

## 2020-10-14 ENCOUNTER — Ambulatory Visit: Payer: PPO | Attending: Adult Health

## 2020-10-14 ENCOUNTER — Encounter: Payer: Self-pay | Admitting: Occupational Therapy

## 2020-10-14 ENCOUNTER — Ambulatory Visit: Payer: PPO | Admitting: Occupational Therapy

## 2020-10-14 DIAGNOSIS — I69351 Hemiplegia and hemiparesis following cerebral infarction affecting right dominant side: Secondary | ICD-10-CM | POA: Diagnosis not present

## 2020-10-14 DIAGNOSIS — R482 Apraxia: Secondary | ICD-10-CM | POA: Diagnosis not present

## 2020-10-14 DIAGNOSIS — R4701 Aphasia: Secondary | ICD-10-CM | POA: Diagnosis not present

## 2020-10-14 DIAGNOSIS — R278 Other lack of coordination: Secondary | ICD-10-CM | POA: Diagnosis not present

## 2020-10-14 DIAGNOSIS — R41844 Frontal lobe and executive function deficit: Secondary | ICD-10-CM | POA: Diagnosis not present

## 2020-10-14 DIAGNOSIS — R208 Other disturbances of skin sensation: Secondary | ICD-10-CM | POA: Insufficient documentation

## 2020-10-14 DIAGNOSIS — R471 Dysarthria and anarthria: Secondary | ICD-10-CM | POA: Insufficient documentation

## 2020-10-14 NOTE — Therapy (Signed)
[facility] [address] [place], [name], [address] Phone: [phone]   Fax:  [phone]  Speech Language Pathology Treatment  Patient Details  Name: Gerald [name] MRN: [id number] Date of Birth: [birth year] Referring Provider (SLP): Frann Rider NP   Encounter Date: 10/14/2020   End of Session - 10/14/20 1237    Visit Number 14    Number of Visits 17    Date for SLP Re-Evaluation 11/20/20    SLP Start Time 9450    SLP Stop Time  1100    SLP Time Calculation (min) 42 min    Activity Tolerance Patient tolerated treatment well           Past Medical History:  Diagnosis Date  . Alcohol abuse   . Cocaine abuse ([place]) 2014  . Hypertension   . Seizures ([place]) 07/2015   had first seizures about 6 months after stroke  . Stroke [facility]) 2014   denies residual on 07/03/2015  . Stroke ([place]) 07/02/2015   "now weak on right side; speech problems" (07/03/2015)  . Tobacco use     Past Surgical History:  Procedure Laterality Date  . COLONOSCOPY WITH PROPOFOL N/A 03/02/2019   Procedure: COLONOSCOPY WITH PROPOFOL;  Surgeon: Carol Ada, MD;  [facility];  Service: Endoscopy;  Laterality: N/A;  . ESOPHAGOGASTRODUODENOSCOPY (EGD) WITH PROPOFOL N/A 03/23/2019   Procedure: ESOPHAGOGASTRODUODENOSCOPY (EGD) WITH PROPOFOL;  Surgeon: Carol Ada, MD;  [facility];  Service: Endoscopy;  Laterality: N/A;  . POLYPECTOMY  03/02/2019   Procedure: POLYPECTOMY;  Surgeon: Carol Ada, MD;  [facility];  Service: Endoscopy;;  . Azzie Almas DILATION N/A 03/23/2019   Procedure: Azzie Almas DILATION;  Surgeon: Carol Ada, MD;  [facility];  Service: Endoscopy;  Laterality: N/A;  . SKIN GRAFT Bilateral 1980s   "got burned by some hot water"    There were no vitals filed for this visit.   Subjective Assessment - 10/14/20 1017    Subjective "Yah I'm gonna keep on practicing that Glendell Docker."    Currently in  Pain? No/denies                 ADULT SLP TREATMENT - 10/14/20 1056      General Information   Behavior/Cognition Alert;Cooperative;Pleasant mood      Treatment Provided   Treatment provided Cognitive-Linquistic      Cognitive-Linquistic Treatment   Treatment focused on Dysarthria;Aphasia    Skilled Treatment SLP worked with pt on high interest pt topics today (football) with expanding MLU taking pt's response and writing it down and having pt expand the utterance and then produce the longer utterance. Pt req'd usual cues for how to expand the utterance. SLP targeted pt independently producing his address - min A necessary initially but by session end (after 6 repetitions thoughtout session) pt was independnent. SLP also had pt work on his wirfe's phone number which pt req'd written cues for success, and min cues occasionally to use his finger to slow down and incr accuracy.      Assessment / Recommendations / Plan   Plan Continue with current plan of care      Progression Toward Goals   Progression toward goals Progressing toward goals            SLP Education - 10/14/20 1236    Education Details slow down, use finger for improved accuracy    Person(s) Educated Patient    Methods Explanation;Demonstration;Verbal cues    Comprehension Verbalized understanding;Returned  demonstration;Verbal cues required;Need further instruction            SLP Short Term Goals - 09/23/20 1151      SLP SHORT TERM GOAL #1   Title Pt will complete HEP with min A over 3 sessions    Baseline 09-02-20, 09-09-20, 09-23-20    Period --   or 9 total visits for all STGs   Status Achieved      SLP SHORT TERM GOAL #2   Title Pt will verbally generate 10 items in personally relevant categories with min A over 3 sessions    Baseline 09-04-20, 09-16-20, 09-18-20    Status Achieved      SLP SHORT TERM GOAL #3   Title Pt will produce sentence responses with compensations for 80% intelligibilty with  occassional min A over 3 sessions    Baseline 09-02-20, 09-04-20, 09-18-20    Status Achieved      SLP SHORT TERM GOAL #4   Title Pt will use word finding compensations in simple 5 minute conversation with min A over 3 sessions    Baseline 09-23-20    Time --    Period --    Status Partially Met      SLP SHORT TERM GOAL #5   Title Pt will use mulitmodal communication (gesture, draw, write 1st letter etc) to augment verbal expression with min A over 3 sessions    Baseline 09-18-20, 09-23-20    Time --    Period --    Status Partially Met      SLP SHORT TERM GOAL #6   Title Pt's caregiver will complete qualitative communication rating scale in first 2 sessions    Baseline CES: 16 & Comm SF: 13    Status Achieved            SLP Long Term Goals - 10/14/20 1239      SLP LONG TERM GOAL #1   Title Pt will be 90% intelligible in 10 simple conversation with min A over 2 sessions    Time 1    Period Weeks   or 17 total visits for all LTGs   Status On-going      SLP LONG TERM GOAL #2   Title Pt will appropriately compensate for word finding versus interjecting "I don't know" for 8/10 opportunities across 3 sessions    Baseline 09-25-20, 10-14-20    Time 1    Period Weeks    Status On-going      SLP LONG TERM GOAL #3   Title Pt will use mulitmodal communication (gesture, draw, write 1st letter etc) to augment verbal expression to meet needs at home with rare min A from family.    Baseline 09-30-20, 10-07-20    Status Achieved      SLP LONG TERM GOAL #4   Title Caregiver will report improvements in communication effectiveness via QOL scale with > 3 point increase    Time 1    Period Weeks    Status On-going            Plan - 10/14/20 1237    Clinical Impression Statement [name] presents with improvements in moderate expressive aphasia and dysarthria. SLP targeted connected speech given a high interest topic, as well as demographic information today. "I don't know" was not heard today's  session but pt perseverated with "OK, Glendell Docker." See "skilled treatment" for additional details of today's session. Given presentation of mild to moderate aphasia, dysarthria, and possible cognitive changes, pt would  benefit from skilled ST intervention to increase effectiveness of communication, reduce caregiver burden, and improve QOL.    Speech Therapy Frequency 2x / week    Duration 8 weeks   or 17 total visits   Treatment/Interventions Compensatory strategies;Functional tasks;Patient/family education;Cueing hierarchy;Cognitive reorganization;Multimodal communcation approach;Other (comment);Compensatory techniques;Internal/external aids;SLP instruction and feedback;Language facilitation    Potential to Achieve Goals Good    Potential Considerations Previous level of function;Severity of impairments    SLP Home Exercise Plan provided    Consulted and Agree with Plan of Care Patient;Family member/caregiver    Family Member Consulted wife, [name]           Patient will benefit from skilled therapeutic intervention in order to improve the following deficits and impairments:   Aphasia  Dysarthria and anarthria    Problem List Patient Active Problem List   Diagnosis Date Noted  . History of stroke 06/09/2017  . Seizure ([place]) 05/22/2016  . Anxiety state 04/20/2016  . Spastic hemiplegia affecting dominant side ([place]) 09/11/2015  . Infection of wound due to methicillin resistant Staphylococcus aureus (MRSA)   . 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 Right hemiparesis ([place]) 07/08/2015  . Acute left ACA ischemic stroke ([place]) 07/08/2015  . Cerebrovascular accident (CVA) ([place])   . Stroke ([place]) 07/02/2015  . Acute ischemic stroke ([place]) 07/02/2015  . CVA (cerebral infarction) 01/15/2013  . Essential hypertension 01/15/2013  . Nicotine dependence 01/15/2013  . Alcohol abuse 01/15/2013    [facility]  ,[place], [place]  10/14/2020, 12:40 PM  [facility] [address] [place], [name], [address] Phone: [phone]   Fax:  [phone]   Name: Gerald [name] MRN: [id number] Date of Birth: 12-30-1960

## 2020-10-14 NOTE — Patient Instructions (Signed)
  Basic Activities to use Arm Functionally:   Use your affected hand to perform the following activities for 20-30 minutes 1-2 times/day.  Stop activity if you experience pain.  - Wipe table top (forward and back, then side to side x10-15 each) - Bring plastic cup to mouth, then return to table top and release.  Repeat 10x - Flip playing cards - Pick up cotton balls, checkers, blocks, bottle caps and place in a bowl/container - Twist off bottle caps and replace (different sizes/shapes) - Fold wash cloths using both hands  - Pick up coins and place in a container or coin bank  - Sweep with both hands while in wheelchair - Drink with a plastic cup - Scoop beans or grits - Pick up a sandwich or piece of pizza, you can use both hands if you need to - Wash your chest and left arm  - Pull down your shirt   - Hold the phone in your left hand - Trace shapes/letters or color with a big marker   - Lock/unlock wheelchair brake  - Put on/take off hat with right hand - squeezing sponge, wringing out cloth - place clothespins on edge of box
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
## 2020-10-16 ENCOUNTER — Ambulatory Visit: Payer: PPO

## 2020-10-16 ENCOUNTER — Other Ambulatory Visit: Payer: Self-pay

## 2020-10-16 DIAGNOSIS — R4701 Aphasia: Secondary | ICD-10-CM

## 2020-10-16 DIAGNOSIS — R471 Dysarthria and anarthria: Secondary | ICD-10-CM
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
## 2020-10-20 ENCOUNTER — Other Ambulatory Visit: Payer: Self-pay

## 2020-10-20 ENCOUNTER — Encounter: Payer: Self-pay | Admitting: Occupational Therapy

## 2020-10-20 ENCOUNTER — Ambulatory Visit: Payer: PPO | Admitting: Occupational Therapy

## 2020-10-20 DIAGNOSIS — R41844 Frontal lobe and executive function deficit: Secondary | ICD-10-CM

## 2020-10-20 DIAGNOSIS — R208 Other disturbances of skin sensation: Secondary | ICD-10-CM

## 2020-10-20 DIAGNOSIS — I69351 Hemiplegia and hemiparesis following cerebral infarction affecting right dominant side: Secondary | ICD-10-CM

## 2020-10-20 DIAGNOSIS — R4701 Aphasia: Secondary | ICD-10-CM | POA: Diagnosis not present

## 2020-10-20 DIAGNOSIS — R278 Other lack of coordination: Secondary | ICD-10-CM
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
## 2020-10-20 NOTE — Patient Instructions (Signed)
    Place basket/box in front of you.  Pick up washcloth/tennis balls, etc. With right hand.  Lean forward and drop in basket.

## 2020-10-21 ENCOUNTER — Ambulatory Visit: Payer: PPO

## 2020-10-21 DIAGNOSIS — R4701 Aphasia: Secondary | ICD-10-CM

## 2020-10-21 DIAGNOSIS — R482 Apraxia: Secondary | ICD-10-CM

## 2020-10-21 DIAGNOSIS — R471 Dysarthria and anarthria: Secondary | ICD-10-CM
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
## 2020-10-23 ENCOUNTER — Ambulatory Visit: Payer: PPO

## 2020-10-23 ENCOUNTER — Other Ambulatory Visit: Payer: Self-pay

## 2020-10-23 DIAGNOSIS — R4701 Aphasia: Secondary | ICD-10-CM

## 2020-10-23 DIAGNOSIS — R471 Dysarthria and anarthria: Secondary | ICD-10-CM

## 2020-10-23 DIAGNOSIS — R482 Apraxia: Secondary | ICD-10-CM
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
## 2020-10-23 NOTE — Patient Instructions (Addendum)
  [name], you did such a great job in speech therapy! It has been so nice getting to know you and [name] and seeing how far you have come.  Remember:  -Talk slowly and clearly -Practice speaking in full sentences -Be specific when speaking so people know exactly what you want -Be mindful of how often you say "I don't know"  Wendelyn Breslow

## 2020-10-30 ENCOUNTER — Other Ambulatory Visit: Payer: Self-pay | Admitting: Adult Health

## 2020-10-30 DIAGNOSIS — R569 Unspecified convulsions: Secondary | ICD-10-CM

## 2020-12-11 DIAGNOSIS — I69351 Hemiplegia and hemiparesis following cerebral infarction affecting right dominant side: Secondary | ICD-10-CM | POA: Diagnosis not present

## 2020-12-19 DIAGNOSIS — R7303 Prediabetes: Secondary | ICD-10-CM | POA: Diagnosis not present

## 2020-12-19 DIAGNOSIS — I639 Cerebral infarction, unspecified: Secondary | ICD-10-CM | POA: Diagnosis not present

## 2020-12-19 DIAGNOSIS — I10 Essential (primary) hypertension: Secondary | ICD-10-CM | POA: Diagnosis not present

## 2020-12-19 DIAGNOSIS — E785 Hyperlipidemia, unspecified: Secondary | ICD-10-CM | POA: Diagnosis not present

## 2021-01-05 ENCOUNTER — Telehealth: Payer: Self-pay | Admitting: Adult Health

## 2021-01-05 DIAGNOSIS — I6329 Cerebral infarction due to unspecified occlusion or stenosis of other precerebral arteries: Secondary | ICD-10-CM

## 2021-01-05 MED ORDER — BACLOFEN 10 MG PO TABS: ORAL_TABLET | ORAL | 5 refills | Status: DC

## 2021-01-05 NOTE — Telephone Encounter (Signed)
Pt's wife [name] on [name] called stating that pt has been having seizures lately that are different than the ones he normally has and she would like to be advised by the RN. Also she states that she has been having trouble getting a refill on his baclofen (LIORESAL) 10 MG tablet and he is due for a refill now and is wanting to ask the RN when she calls her back if she is needing to do something special for her not to continue to have issues getting the medication filled.

## 2021-01-05 NOTE — Telephone Encounter (Signed)
Baclofen ordered adjusted so he receives 150 tablets (5 tabs per day) for 30 day supply.  At prior visit, reported seizure like activity more consistent with spasticity - please verify if reported seizures are similar to what was reported at prior visit or if there are different symptoms. Thank you.

## 2021-01-05 NOTE — Telephone Encounter (Signed)
LVM asking her to return my call

## 2021-01-06 NOTE — Telephone Encounter (Signed)
Eula Fried, on DPR who stated she thinks he's having mini seizures, but not as bad as usual. His arm tightens and jumps like a muscle spasm, his hand is tight. After a minute it goes limp then reoccurs in 30 minutes. She stated it began last Fri and has happened daily since. I advised her of new Baclofen Rx and dose sent yesterday, advised she pick it up and start today, let us know how he is doing in a few days. She asked if I thought he's having seizures; I advised what she has described sounds more like spasms, spasticity. She agreed, stated she didn't think he was having seizures. She stated she will pick up Rx today, verbalized understanding, appreciation.

## 2021-01-12 ENCOUNTER — Encounter: Payer: Self-pay | Admitting: Adult Health

## 2021-01-12 ENCOUNTER — Ambulatory Visit: Payer: PPO | Admitting: Adult Health

## 2021-01-12 VITALS — BP 125/82 | HR 61 | Ht 72.0 in

## 2021-01-12 DIAGNOSIS — G8110 Spastic hemiplegia affecting unspecified side: Secondary | ICD-10-CM | POA: Diagnosis not present

## 2021-01-12 DIAGNOSIS — R569 Unspecified convulsions: Secondary | ICD-10-CM

## 2021-01-12 DIAGNOSIS — I6329 Cerebral infarction due to unspecified occlusion or stenosis of other precerebral arteries: Secondary | ICD-10-CM | POA: Diagnosis not present

## 2021-01-12 MED ORDER — BACLOFEN 20 MG PO TABS: 20.0000 mg | ORAL_TABLET | Freq: Three times a day (TID) | ORAL | 6 refills | Status: DC

## 2021-01-12 MED ORDER — LEVETIRACETAM 750 MG PO TABS: 1500.0000 mg | ORAL_TABLET | Freq: Two times a day (BID) | ORAL | 3 refills | Status: DC
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
## 2021-01-12 NOTE — Patient Instructions (Signed)
Increase baclofen to '20mg'$  three times daily  You will be called to Dr. Letta Pate office for continued spasticity and shoulder concerns  Continue on keppra '1500mg'$  twice daily for seizure prevention  Continue Xarelto (rivaroxaban) daily  and atorvastatin  for secondary stroke prevention  Continue to follow up with PCP regarding cholesterol and blood pressure management  Maintain strict control of hypertension with blood pressure goal below 130/90 and cholesterol with LDL cholesterol (bad cholesterol) goal below 70 mg/dL.       Followup in the future with me in 6 months or call earlier if needed       Thank you for coming to see [place] at [facility]. I hope we have been able to provide you high quality care today.  You may receive a patient satisfaction survey over the next few weeks. We would appreciate your feedback and comments so that we may continue to improve ourselves and the health of our patients.    Spasticity Spasticity is a condition in which your muscles contract suddenly and unpredictably (spasm). Spasticity usually affects your arms, legs, or back. It can also affect theway you walk. Spasticity can range from mild muscle stiffness and tightness to severe, uncontrollable muscle spasms. Severe spasticity can be painful and can freezeyour muscles in an uncomfortable position. Follow these instructions at home: Managing muscle stiffness and spasms     Wear a brace as told by your health care provider to prevent muscle contractions. Have the affected muscles massaged. If directed, apply heat to the affected muscle area. Use the heat source that your health care provider recommends, such as a moist heat pack or heating pad. Place a towel between your skin and the heat source. Leave the heat on for 20-30 minutes. Remove the head if your skin turns bright red. This is especially important if you are unable to feel pain, heat, or cold. You may have a  greater risk of getting burned. If directed, apply ice to the affected muscle area: Put ice in a plastic bag. Place a towel between your skin and the bag or between your brace and the bag. Leave the ice on for 20 minutes, 2?3 times a day. Activity Stay active as directed by your health care provider. Find a safe exercise program that fits your needs and ability. Maintain good posture when walking and sitting. Work with a physical therapist to learn exercises that will stretch and strengthen your muscles. Do stretching and range of motion exercises at home as told by a physical therapist. Work with an occupational therapist. This type of health care provider can help you function better at home and at work. If you have severe spasticity, use mobility aids, such as a walker or cane, as told by your health care provider. General instructions Watch your condition for any changes. Wear loose, comfortable clothing that does not restrict your movement. Wear closed-toe shoes that fit well and support your feet. Wear shoes that have rubber soles or low heels. Keep all follow-up visits as told by your health care provider. This is important. Take over-the-counter and prescription medicine only as told by your health care provider. Contact a health care provider if you: Have worsening muscle spasms. Develop other symptoms along with spasticity. Have a fever or chills. Experience a burning feeling when you pass urine. Become constipated. Need more support at home. Get help right away if you: Have trouble breathing. Have a muscle spasm that freezes you into a painful position. Cannot walk.  Cannot care for yourself at home. Have trouble passing urine or have urinary incontinence. Summary Spasticity is a condition in which your muscles contract suddenly and unpredictably (spasm). Spasticity usually affects your arms, legs, or back. Spasticity can range from mild muscle stiffness and tightness to  severe, uncontrollable muscle spasms. Do stretching and range of motion exercises at home as told by a physical therapist. Take over-the-counter and prescription medicine only as told by your health care provider. This information is not intended to replace advice given to you by your health care provider. Make sure you discuss any questions you have with your healthcare provider. Document Revised: 06/28/2017 Document Reviewed: 06/28/2017 Elsevier Patient Education  2022 [employer].

## 2021-01-15 NOTE — Progress Notes (Signed)
I agree with the above plan 

## 2021-03-17 ENCOUNTER — Encounter: Payer: Self-pay | Admitting: Physical Medicine & Rehabilitation

## 2021-03-17 ENCOUNTER — Encounter: Payer: PPO | Attending: Physical Medicine & Rehabilitation | Admitting: Physical Medicine & Rehabilitation

## 2021-03-17 ENCOUNTER — Other Ambulatory Visit: Payer: Self-pay

## 2021-03-17 VITALS — BP 103/69 | HR 62 | Temp 98.5°F

## 2021-03-17 DIAGNOSIS — G8110 Spastic hemiplegia affecting unspecified side: Secondary | ICD-10-CM | POA: Diagnosis not present
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
## 2021-03-24 DIAGNOSIS — I639 Cerebral infarction, unspecified: Secondary | ICD-10-CM | POA: Diagnosis not present

## 2021-03-24 DIAGNOSIS — F17290 Nicotine dependence, other tobacco product, uncomplicated: Secondary | ICD-10-CM | POA: Diagnosis not present

## 2021-03-24 DIAGNOSIS — E785 Hyperlipidemia, unspecified: Secondary | ICD-10-CM | POA: Diagnosis not present

## 2021-03-24 DIAGNOSIS — I10 Essential (primary) hypertension: Secondary | ICD-10-CM | POA: Diagnosis not present

## 2021-04-17 ENCOUNTER — Encounter: Payer: PPO | Attending: Physical Medicine & Rehabilitation | Admitting: Physical Medicine & Rehabilitation

## 2021-04-17 ENCOUNTER — Other Ambulatory Visit: Payer: Self-pay

## 2021-04-17 ENCOUNTER — Encounter: Payer: Self-pay | Admitting: Physical Medicine & Rehabilitation

## 2021-04-17 VITALS — BP 124/77 | HR 63

## 2021-04-17 DIAGNOSIS — G8110 Spastic hemiplegia affecting unspecified side: Secondary | ICD-10-CM | POA: Diagnosis not present

## 2021-04-17 NOTE — Progress Notes (Signed)
Dysport Injection for spasticity using needle EMG guidance  Dilution: 200 Units/ml Indication: Severe spasticity which interferes with ADL,mobility and/or  hygiene and is unresponsive to medication management and other conservative care Informed consent was obtained after describing risks and benefits of the procedure with the patient. This includes bleeding, bruising, infection, excessive weakness, or medication side effects. A REMS form is on file and signed. Needle:  needle electrode Number of units per muscle Plan for next Dysport injection  Vast Med 100 0-1+ Vast Lat 100 1-2+ Rectus Fem 100 0-1+ Vast Int 100 0-1+   Biceps 200- medial 2-3+ Brachialis 200 medial 2-3+ Brachiorad 200 2-3+ All injections were done after obtaining appropriate EMG activity and after negative drawback for blood. The patient tolerated the procedure well. Post procedure instructions were given. A followup appointment was made.

## 2021-04-17 NOTE — Patient Instructions (Signed)

## 2021-06-18 ENCOUNTER — Encounter: Payer: PPO | Attending: Physical Medicine & Rehabilitation | Admitting: Physical Medicine & Rehabilitation

## 2021-06-18 ENCOUNTER — Ambulatory Visit: Payer: PPO | Admitting: Physical Medicine & Rehabilitation

## 2021-06-18 DIAGNOSIS — G8110 Spastic hemiplegia affecting unspecified side: Secondary | ICD-10-CM | POA: Insufficient documentation

## 2021-06-24 DIAGNOSIS — E669 Obesity, unspecified: Secondary | ICD-10-CM | POA: Diagnosis not present

## 2021-06-24 DIAGNOSIS — F3342 Major depressive disorder, recurrent, in full remission: Secondary | ICD-10-CM | POA: Diagnosis not present

## 2021-06-24 DIAGNOSIS — I69351 Hemiplegia and hemiparesis following cerebral infarction affecting right dominant side: Secondary | ICD-10-CM | POA: Diagnosis not present

## 2021-06-24 DIAGNOSIS — I4891 Unspecified atrial fibrillation: Secondary | ICD-10-CM | POA: Diagnosis not present

## 2021-06-24 DIAGNOSIS — G40909 Epilepsy, unspecified, not intractable, without status epilepticus: Secondary | ICD-10-CM | POA: Diagnosis not present

## 2021-06-24 DIAGNOSIS — F419 Anxiety disorder, unspecified: Secondary | ICD-10-CM | POA: Diagnosis not present

## 2021-06-24 DIAGNOSIS — I10 Essential (primary) hypertension: Secondary | ICD-10-CM | POA: Diagnosis not present

## 2021-06-24 DIAGNOSIS — G8929 Other chronic pain: Secondary | ICD-10-CM | POA: Diagnosis not present

## 2021-06-24 DIAGNOSIS — H9193 Unspecified hearing loss, bilateral: Secondary | ICD-10-CM | POA: Diagnosis not present

## 2021-06-24 DIAGNOSIS — D6869 Other thrombophilia: Secondary | ICD-10-CM | POA: Diagnosis not present

## 2021-06-24 DIAGNOSIS — E785 Hyperlipidemia, unspecified: Secondary | ICD-10-CM | POA: Diagnosis not present

## 2021-06-24 DIAGNOSIS — F17210 Nicotine dependence, cigarettes, uncomplicated: Secondary | ICD-10-CM | POA: Diagnosis not present

## 2021-07-13 DIAGNOSIS — I639 Cerebral infarction, unspecified: Secondary | ICD-10-CM | POA: Diagnosis not present

## 2021-07-13 DIAGNOSIS — E785 Hyperlipidemia, unspecified: Secondary | ICD-10-CM | POA: Diagnosis not present

## 2021-07-13 DIAGNOSIS — I10 Essential (primary) hypertension: Secondary | ICD-10-CM | POA: Diagnosis not present

## 2021-07-13 DIAGNOSIS — R7303 Prediabetes: Secondary | ICD-10-CM | POA: Diagnosis not present

## 2021-07-13 DIAGNOSIS — F17290 Nicotine dependence, other tobacco product, uncomplicated: Secondary | ICD-10-CM | POA: Diagnosis not present

## 2021-07-20 ENCOUNTER — Ambulatory Visit: Payer: PPO | Admitting: Adult Health

## 2021-07-27 ENCOUNTER — Emergency Department (HOSPITAL_COMMUNITY): Payer: PPO

## 2021-07-27 ENCOUNTER — Inpatient Hospital Stay (HOSPITAL_COMMUNITY)
Admission: EM | Admit: 2021-07-27 | Discharge: 2021-07-30 | DRG: 101 | Disposition: A | Discharge status: Home Health | Payer: PPO | Attending: Internal Medicine | Admitting: Internal Medicine

## 2021-07-27 ENCOUNTER — Encounter (HOSPITAL_COMMUNITY): Payer: Self-pay

## 2021-07-27 ENCOUNTER — Other Ambulatory Visit: Payer: Self-pay

## 2021-07-27 DIAGNOSIS — F17210 Nicotine dependence, cigarettes, uncomplicated: Secondary | ICD-10-CM | POA: Diagnosis present

## 2021-07-27 DIAGNOSIS — G40101 Localization-related (focal) (partial) symptomatic epilepsy and epileptic syndromes with simple partial seizures, not intractable, with status epilepticus: Secondary | ICD-10-CM | POA: Diagnosis present

## 2021-07-27 DIAGNOSIS — I10 Essential (primary) hypertension: Secondary | ICD-10-CM | POA: Diagnosis present

## 2021-07-27 DIAGNOSIS — Z8673 Personal history of transient ischemic attack (TIA), and cerebral infarction without residual deficits: Secondary | ICD-10-CM | POA: Diagnosis not present

## 2021-07-27 DIAGNOSIS — Z7901 Long term (current) use of anticoagulants: Secondary | ICD-10-CM

## 2021-07-27 DIAGNOSIS — I69351 Hemiplegia and hemiparesis following cerebral infarction affecting right dominant side: Secondary | ICD-10-CM

## 2021-07-27 DIAGNOSIS — Z20822 Contact with and (suspected) exposure to covid-19: Secondary | ICD-10-CM | POA: Diagnosis present

## 2021-07-27 DIAGNOSIS — Z79899 Other long term (current) drug therapy: Secondary | ICD-10-CM | POA: Diagnosis not present

## 2021-07-27 DIAGNOSIS — G40901 Epilepsy, unspecified, not intractable, with status epilepticus: Secondary | ICD-10-CM | POA: Insufficient documentation

## 2021-07-27 DIAGNOSIS — M7989 Other specified soft tissue disorders: Secondary | ICD-10-CM | POA: Diagnosis not present

## 2021-07-27 DIAGNOSIS — R569 Unspecified convulsions: Secondary | ICD-10-CM | POA: Diagnosis not present

## 2021-07-27 DIAGNOSIS — Z23 Encounter for immunization: Secondary | ICD-10-CM | POA: Diagnosis present

## 2021-07-27 DIAGNOSIS — R0689 Other abnormalities of breathing: Secondary | ICD-10-CM | POA: Diagnosis not present

## 2021-07-27 DIAGNOSIS — R531 Weakness: Secondary | ICD-10-CM | POA: Diagnosis not present

## 2021-07-27 DIAGNOSIS — Z8249 Family history of ischemic heart disease and other diseases of the circulatory system: Secondary | ICD-10-CM | POA: Diagnosis not present

## 2021-07-27 DIAGNOSIS — R000 Tachycardia, unspecified: Secondary | ICD-10-CM | POA: Diagnosis not present

## 2021-07-27 DIAGNOSIS — F1910 Other psychoactive substance abuse, uncomplicated: Secondary | ICD-10-CM | POA: Diagnosis present

## 2021-07-27 DIAGNOSIS — I69320 Aphasia following cerebral infarction: Secondary | ICD-10-CM

## 2021-07-27 DIAGNOSIS — R4182 Altered mental status, unspecified: Secondary | ICD-10-CM | POA: Diagnosis present

## 2021-07-27 DIAGNOSIS — I6381 Other cerebral infarction due to occlusion or stenosis of small artery: Secondary | ICD-10-CM | POA: Diagnosis not present

## 2021-07-27 DIAGNOSIS — Z823 Family history of stroke: Secondary | ICD-10-CM

## 2021-07-27 DIAGNOSIS — R404 Transient alteration of awareness: Secondary | ICD-10-CM | POA: Diagnosis not present

## 2021-07-27 HISTORY — DX: Epilepsy, unspecified, not intractable, with status epilepticus: G40.901

## 2021-07-27 LAB — CBC WITH DIFFERENTIAL/PLATELET
Abs Immature Granulocytes: 0.02 10*3/uL (ref 0.00–0.07)
Basophils Absolute: 0.1 10*3/uL (ref 0.0–0.1)
Basophils Relative: 1 %
Eosinophils Absolute: 0.2 10*3/uL (ref 0.0–0.5)
Eosinophils Relative: 2 %
HCT: 45.6 % (ref 39.0–52.0)
Hemoglobin: 14.6 g/dL (ref 13.0–17.0)
Immature Granulocytes: 0 %
Lymphocytes Relative: 36 %
Lymphs Abs: 3.3 10*3/uL (ref 0.7–4.0)
MCH: 31.4 pg (ref 26.0–34.0)
MCHC: 32 g/dL (ref 30.0–36.0)
MCV: 98.1 fL (ref 80.0–100.0)
Monocytes Absolute: 0.7 10*3/uL (ref 0.1–1.0)
Monocytes Relative: 8 %
Neutro Abs: 4.9 10*3/uL (ref 1.7–7.7)
Neutrophils Relative %: 53 %
Platelets: 268 10*3/uL (ref 150–400)
RBC: 4.65 MIL/uL (ref 4.22–5.81)
RDW: 12.7 % (ref 11.5–15.5)
WBC: 9.1 10*3/uL (ref 4.0–10.5)
nRBC: 0 % (ref 0.0–0.2)

## 2021-07-27 LAB — URINALYSIS, ROUTINE W REFLEX MICROSCOPIC
Bilirubin Urine: NEGATIVE
Glucose, UA: NEGATIVE mg/dL
Hgb urine dipstick: NEGATIVE
Ketones, ur: 5 mg/dL — AB
Leukocytes,Ua: NEGATIVE
Nitrite: NEGATIVE
Protein, ur: NEGATIVE mg/dL
Specific Gravity, Urine: 1.016 (ref 1.005–1.030)
pH: 5 (ref 5.0–8.0)

## 2021-07-27 LAB — COMPREHENSIVE METABOLIC PANEL
ALT: 16 U/L (ref 0–44)
AST: 20 U/L (ref 15–41)
Albumin: 3.6 g/dL (ref 3.5–5.0)
Alkaline Phosphatase: 54 U/L (ref 38–126)
Anion gap: 10 (ref 5–15)
BUN: 10 mg/dL (ref 6–20)
CO2: 24 mmol/L (ref 22–32)
Calcium: 9.1 mg/dL (ref 8.9–10.3)
Chloride: 101 mmol/L (ref 98–111)
Creatinine, Ser: 1.11 mg/dL (ref 0.61–1.24)
GFR, Estimated: >60 mL/min (ref >60)
Glucose, Bld: 101 mg/dL — ABNORMAL HIGH (ref 70–99)
Potassium: 4 mmol/L (ref 3.5–5.1)
Sodium: 135 mmol/L (ref 135–145)
Total Bilirubin: 0.7 mg/dL (ref 0.3–1.2)
Total Protein: 7.4 g/dL (ref 6.5–8.1)

## 2021-07-27 LAB — MAGNESIUM
Magnesium: 1.5 mg/dL — ABNORMAL LOW (ref 1.7–2.4)
Magnesium: 2.4 mg/dL (ref 1.7–2.4)

## 2021-07-27 LAB — RESP PANEL BY RT-PCR (FLU A&B, COVID) ARPGX2
Influenza A by PCR: NEGATIVE
Influenza B by PCR: NEGATIVE
SARS Coronavirus 2 by RT PCR: NEGATIVE

## 2021-07-27 LAB — LACTIC ACID, PLASMA
Lactic Acid, Venous: 0.9 mmol/L (ref 0.5–1.9)
Lactic Acid, Venous: 1.8 mmol/L (ref 0.5–1.9)

## 2021-07-27 LAB — ETHANOL: Alcohol, Ethyl (B): <10 mg/dL (ref <10)

## 2021-07-27 MED ORDER — LISINOPRIL 20 MG PO TABS
40.0000 mg | ORAL_TABLET | Freq: Every day | ORAL | Status: DC
  Administered 2021-07-29 – 2021-07-30 (×2): 40 mg via ORAL
  Filled 2021-07-27 (×2): qty 2

## 2021-07-27 MED ORDER — MAGNESIUM OXIDE -MG SUPPLEMENT 400 (240 MG) MG PO TABS: 400.0000 mg | ORAL_TABLET | Freq: Every day | ORAL | Status: DC

## 2021-07-27 MED ORDER — RIVAROXABAN 20 MG PO TABS
20.0000 mg | ORAL_TABLET | Freq: Every day | ORAL | Status: DC
  Administered 2021-07-29: 20 mg via ORAL
  Filled 2021-07-27: qty 1

## 2021-07-27 MED ORDER — LEVETIRACETAM IN NACL 500 MG/100ML IV SOLN
500.0000 mg | Freq: Once | INTRAVENOUS | Status: AC
  Administered 2021-07-27: 500 mg via INTRAVENOUS
  Filled 2021-07-27: qty 100

## 2021-07-27 MED ORDER — ALBUTEROL SULFATE (2.5 MG/3ML) 0.083% IN NEBU: 2.5000 mg | INHALATION_SOLUTION | Freq: Four times a day (QID) | RESPIRATORY_TRACT | Status: DC | PRN

## 2021-07-27 MED ORDER — SODIUM CHLORIDE 0.9 % IV SOLN: INTRAVENOUS | Status: DC

## 2021-07-27 MED ORDER — LORAZEPAM 2 MG/ML IJ SOLN: 1.0000 mg | Freq: Once | INTRAMUSCULAR | Status: DC

## 2021-07-27 MED ORDER — SODIUM CHLORIDE 0.9 % IV SOLN
100.0000 mg | Freq: Two times a day (BID) | INTRAVENOUS | Status: DC
  Administered 2021-07-27: 100 mg via INTRAVENOUS
  Filled 2021-07-27 (×3): qty 10

## 2021-07-27 MED ORDER — MAGNESIUM SULFATE 2 GM/50ML IV SOLN
2.0000 g | Freq: Once | INTRAVENOUS | Status: AC
  Administered 2021-07-27: 2 g via INTRAVENOUS
  Filled 2021-07-27: qty 50

## 2021-07-27 MED ORDER — SODIUM CHLORIDE 0.9% FLUSH
3.0000 mL | Freq: Two times a day (BID) | INTRAVENOUS | Status: DC
  Administered 2021-07-27 – 2021-07-30 (×6): 3 mL via INTRAVENOUS

## 2021-07-27 MED ORDER — VALPROATE SODIUM 100 MG/ML IV SOLN
3000.0000 mg | Freq: Once | INTRAVENOUS | Status: AC
  Administered 2021-07-27: 3000 mg via INTRAVENOUS
  Filled 2021-07-27: qty 30

## 2021-07-27 MED ORDER — LEVETIRACETAM IN NACL 1000 MG/100ML IV SOLN
1000.0000 mg | Freq: Once | INTRAVENOUS | Status: AC
  Administered 2021-07-27: 1000 mg via INTRAVENOUS
  Filled 2021-07-27: qty 100

## 2021-07-27 MED ORDER — ACETAMINOPHEN 650 MG RE SUPP: 650.0000 mg | Freq: Four times a day (QID) | RECTAL | Status: DC | PRN

## 2021-07-27 MED ORDER — LEVETIRACETAM IN NACL 1500 MG/100ML IV SOLN
1500.0000 mg | Freq: Two times a day (BID) | INTRAVENOUS | Status: AC
  Administered 2021-07-27 – 2021-07-30 (×5): 1500 mg via INTRAVENOUS
  Filled 2021-07-27 (×8): qty 100

## 2021-07-27 MED ORDER — LORAZEPAM 2 MG/ML IJ SOLN
2.0000 mg | INTRAMUSCULAR | Status: DC | PRN
  Administered 2021-07-27 – 2021-07-28 (×3): 2 mg via INTRAVENOUS
  Filled 2021-07-27 (×3): qty 1

## 2021-07-27 MED ORDER — ATORVASTATIN CALCIUM 10 MG PO TABS
20.0000 mg | ORAL_TABLET | Freq: Every day | ORAL | Status: DC
Start: 2021-07-27 — End: 2021-07-30
  Administered 2021-07-29 – 2021-07-30 (×2): 20 mg via ORAL
  Filled 2021-07-27 (×2): qty 2

## 2021-07-27 MED ORDER — VALPROATE SODIUM 100 MG/ML IV SOLN
500.0000 mg | Freq: Two times a day (BID) | INTRAVENOUS | Status: DC
  Administered 2021-07-27 – 2021-07-28 (×3): 500 mg via INTRAVENOUS
  Filled 2021-07-27 (×4): qty 5

## 2021-07-27 MED ORDER — LORAZEPAM 2 MG/ML IJ SOLN
2.0000 mg | Freq: Once | INTRAMUSCULAR | Status: AC
  Administered 2021-07-27: 2 mg via INTRAVENOUS
  Filled 2021-07-27: qty 1

## 2021-07-27 MED ORDER — AMLODIPINE BESYLATE 5 MG PO TABS
5.0000 mg | ORAL_TABLET | Freq: Every day | ORAL | Status: DC
  Administered 2021-07-29 – 2021-07-30 (×2): 5 mg via ORAL
  Filled 2021-07-27 (×2): qty 1

## 2021-07-27 MED ORDER — ACETAMINOPHEN 325 MG PO TABS: 650.0000 mg | ORAL_TABLET | Freq: Four times a day (QID) | ORAL | Status: DC | PRN

## 2021-07-27 MED ORDER — METOPROLOL TARTRATE 25 MG PO TABS: 25.0000 mg | ORAL_TABLET | Freq: Two times a day (BID) | ORAL | Status: DC

## 2021-07-27 NOTE — ED Notes (Signed)
Neurology provider at bedside.

## 2021-07-27 NOTE — ED Provider Notes (Signed)
[facility] EMERGENCY DEPARTMENT Provider Note   CSN: [id number] Arrival date & time: 07/27/21  0325     History  Chief Complaint  Patient presents with   Seizures    Gerald [name] is a 61 y.o. male.  The history is provided by the EMS personnel and the spouse.  Seizures He has history of hypertension, stroke, alcohol abuse, seizure disorder and comes in following a seizure at home.  Apparently, he does have seizures somewhat regularly but tonight seizure was worse than normal.  Seizures tend to start with twitching on the right side of the face, but he does have generalized shaking.  He claims compliance with levetiracetam 1500 mg twice a day.  He does admit to drinking 4 beers a day.  EMS reported predominantly right-sided shaking and gave a total of 7.5 mg midazolam.  There is no incontinence and no bit lip or tongue.   Home Medications Prior to Admission medications   Medication Sig Start Date End Date Taking? Authorizing Provider  acetaminophen (TYLENOL) 500 MG tablet Take 500 mg by mouth every 6 (six) hours as needed (pain).    [provider]  amLODipine (NORVASC) 5 MG tablet Take 5 mg by mouth daily. 09/06/16   [provider]  atorvastatin (LIPITOR) 20 MG tablet Take 1 tablet (20 mg total) by mouth daily at 6 PM. Patient taking differently: Take 20 mg by mouth daily. 09/06/16   Charlott Rakes, MD  baclofen (LIORESAL) 10 MG tablet 1 tablet THREE TIMES A DAY (route: oral) 08/24/18   [provider]  baclofen (LIORESAL) 20 MG tablet Take 1 tablet (20 mg total) by mouth 3 (three) times daily. 15mg  AM (1.5 tab), 15mg  (1.5 tab) afternoon and 20mg  (2 tabs) bedtime 01/12/21   Frann Rider, NP  levETIRAcetam (KEPPRA) 750 MG tablet  04/29/21   [provider]  lisinopril (PRINIVIL,ZESTRIL) 40 MG tablet Take 1 tablet (40 mg total) by mouth daily. 04/20/16   Charlott Rakes, MD  metoprolol tartrate (LOPRESSOR) 25 MG tablet Take 1  tablet (25 mg total) by mouth 2 (two) times daily. 01/28/17   Charlott Rakes, MD  rivaroxaban (XARELTO) 20 MG TABS tablet 1 tablet ONCE DAILY (route: oral) 09/13/18   [provider]  sertraline (ZOLOFT) 50 MG tablet Take 1 tablet (50 mg total) by mouth daily. 01/28/17   Charlott Rakes, MD  spironolactone (ALDACTONE) 50 MG tablet Take 50 mg by mouth daily. 09/06/16   [provider]      Allergies    Patient has no known allergies.    Review of Systems   Review of Systems  Neurological:  Positive for seizures.  All other systems reviewed and are negative.  Physical Exam Updated Vital Signs BP (!) 145/94    Pulse 95    Temp 99 F (37.2 C) (Oral)    Resp (!) 21    SpO2 93%  Physical Exam Vitals and nursing note reviewed.  61 year old male, resting comfortably and in no acute distress. Vital signs are significant for elevated blood pressure. Oxygen saturation is 93%, which is normal. Head is normocephalic and atraumatic. PERRLA, EOMI. Oropharynx is clear. Neck is nontender and supple without adenopathy or JVD. Back is nontender and there is no CVA tenderness. Lungs are clear without rales, wheezes, or rhonchi. Chest is nontender. Heart has regular rate and rhythm without murmur. Abdomen is soft, flat, nontender without masses or hepatosplenomegaly and peristalsis is normoactive. Extremities have 1-2+ edema, full  range of motion is present. Skin is warm and dry without rash. Neurologic: Awake and able to answer questions but speech is slow with marked dysarthria.  Cranial nerves are otherwise grossly intact.  He does have a rather dense right hemiparesis.  Clonus is present with about 2 beats bilaterally.  Intermittent facial twitching is noted which is more prominent on the right.  ED Results / Procedures / Treatments   Labs (all labs ordered are listed, but only abnormal results are displayed) Labs Reviewed  COMPREHENSIVE METABOLIC PANEL - Abnormal; Notable for the  following components:      Result Value   Glucose, Bld 101 (*)    All other components within normal limits  MAGNESIUM - Abnormal; Notable for the following components:   Magnesium 1.5 (*)    All other components within normal limits  CBC WITH DIFFERENTIAL/PLATELET  ETHANOL    EKG EKG Interpretation  Date/Time:  Monday July 27 2021 03:35:45 EST Ventricular Rate:  101 PR Interval:  161 QRS Duration: 78 QT Interval:  331 QTC Calculation: 429 R Axis:   36 Text Interpretation: Sinus tachycardia Multiple ventricular premature complexes Posterior infarct, old Nonspecific T abnormalities, lateral leads When compared with ECG of 05/22/2016, Premature ventricular complexes are now present Confirmed by Delora Fuel ([id number]) on 07/27/2021 3:44:11 AM   Procedures Procedures  Cardiac monitor, per my interpretation, shows normal sinus rhythm.  Medications Ordered in ED Medications  levETIRAcetam (KEPPRA) IVPB 1000 mg/100 mL premix (1,000 mg Intravenous New Bag/Given 07/27/21 0404)    ED Course/ Medical Decision Making/ A&P                           Medical Decision Making Amount and/or Complexity of Data Reviewed Labs: ordered.  Risk Prescription drug management.   Breakthrough partial seizure which has persisted despite midazolam.  Old records were reviewed, and his last ED visit for seizure was in 2017.  We will check screening labs and give additional intravenous levetiracetam.  Following levetiracetam, seizure activity has ceased.  Wife states that his mental status is back at his baseline.  Labs are pending.  Labs show mild hypomagnesemia.  He is given intravenous magnesium and will be discharged with instructions to follow-up with his neurologist to discuss whether he needs to have his anticonvulsant regimen changed.        Final Clinical Impression(s) / ED Diagnoses Final diagnoses:  Seizure ([place])  Hypomagnesemia    Rx / DC Orders ED Discharge Orders     None          Delora Fuel, MD 26/94/85 [phone]

## 2021-07-27 NOTE — ED Notes (Signed)
Pt w/ R arm twitching and able to speak to this RN. Notified MD

## 2021-07-27 NOTE — ED Notes (Signed)
Assumed pt care at this time

## 2021-07-27 NOTE — ED Notes (Signed)
Pt requesting for O2 to be taken off - O2 taken off at this time and will monitor his O2 on room air

## 2021-07-27 NOTE — ED Notes (Signed)
Smith MD at bedside ?

## 2021-07-27 NOTE — ED Triage Notes (Signed)
Pt BIB EMS from home. Pt has hx of focal seizures, on Keppra. Wife reports over the last day pts focal seizures have been happening more often - for the last 4 hours pt focal seizures have been constant.When EMS arrived pt was Aox4 and GCS 15  EMS gave 5 IM Versed - did not stop seizure activity - EMS gave an additional 2.5 IV  Pt has hx of stroke with right sided deficits  VS with EMS  208/126 HR 90s RR 20s - 98% on NRB - end tidal 38 Cbg 104

## 2021-07-27 NOTE — Progress Notes (Signed)
Started cEEG study.  Tested patient event button.  Educated family RE patient event button.

## 2021-07-27 NOTE — Significant Event (Signed)
LT EEG still with seizure activity and added Vimpat 100mg  IV BID to start asap. Neuro will follow.

## 2021-07-27 NOTE — Progress Notes (Signed)
EEG complete - results pending 

## 2021-07-27 NOTE — Assessment & Plan Note (Addendum)
Replaced. Repeat in am.

## 2021-07-27 NOTE — ED Notes (Signed)
Dr. Glick at bedside.  

## 2021-07-27 NOTE — ED Notes (Signed)
Called main pharmacy for vimpat to be sent STAT

## 2021-07-27 NOTE — Progress Notes (Signed)
Pt with another seizure. LTM read from Dr. Doree Albee shows subtle subclinical seizure left central about every hr for past several hrs. Loaded with vimpat 100mg   then 100mg  BID (orders placed). May give ativan 2mg  prn for seizures q 4hrs.

## 2021-07-27 NOTE — Assessment & Plan Note (Addendum)
Patient with a prior history of strokes with residual deficits including right spastic hemiparesis, aphasia, and dysarthria. -Continue Xarelto and atorvastatin once able to take by mouth.
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
## 2021-07-27 NOTE — ED Notes (Signed)
Attempted to call report

## 2021-07-27 NOTE — ED Provider Notes (Signed)
The patient was initially seen by Dr. Roxanne Mins.  Please see his note.  Plan was to reassess following his magnesium infusion.  I was called to the bedside as the wife was concerned the patient is having some recurrent seizure-like activity.  On exam the patient does have some constant tremors of his right upper extremity.  However he started having more rhythmic shaking of his right upper extremity.  He has not lost consciousness and is alert and awake.  Wife has stated that ever since he started having these episodes he has had decreased strength and weakness involving his right upper extremity.  Patient does have history of prior stroke causing right-sided weakness but it is usually better than it has been.  Exam patient does not appear to be having recurrent seizures.  We will give a dose of Ativan IV.  I will consult the neuro hospitalist service.  May  need EEG and admission  Discussed with Dr Reeves Forth.  Will start with stat EEG.   Determine further treatment pending that result.  EEG did show status epilepticus.  Admission recommended.  I will consult with medical service.   Dorie Rank, MD 07/27/21 1343

## 2021-07-27 NOTE — Assessment & Plan Note (Addendum)
Patient with prior history of focal seizures following prior stroke.  Medication regimen includes Keppra 750 mg twice daily.  EEG monitoring noted frequent electrographic seizures arising from left hemisphere.  Patient had initally been loaded with Keppra and Depakote was added.  Due to persistent seizure activity neurology has been notified and on Vimpat. -Admit to progressive bed -Seizure precautions -Continuous pulse oximetry monitoring nasal cannula oxygen to maintain O2 saturation greater than 92% -N.p.o. and orders placed for NGT to give meds -Long-term EEG monitoring -Ativan IV as needed for seizure activity -IV Keppra, Depacon, and Vimpat due to persistent seizure activity per neurology -Normal saline IV fluids at 75 mL/h -Appreciate neurology consultative services we will follow for any further recommendations - pt remains somnolent. SLP eval recommended NPO , has mod aspiration risk.

## 2021-07-27 NOTE — ED Notes (Signed)
Pt's wife called out saying pt had a few second episode of seizure-like activity (shaking of his shoulder). Upon assessment, pt in NAD and no seizure-like activity noted. Notified Roxanne Mins MD.

## 2021-07-27 NOTE — ED Notes (Signed)
Notified Smith MD about not giving pt PO meds d/t not safe placing NG tube at this time d/t seizures.

## 2021-07-27 NOTE — Assessment & Plan Note (Addendum)
Sub optimal BP parameters.  Amlodipine, lisinopril and metoprolol are on hold as pt is NPO.  Continue with prn hydralazine and metoprolol.

## 2021-07-27 NOTE — Progress Notes (Signed)
LTM maint complete - no skin breakdown Maintenance  F7 A2  Patient transported from er to 3w all leads on, machine connect , study in progress.  [facility] monitored, Event button test confirmed by [facility].

## 2021-07-27 NOTE — ED Notes (Signed)
Pt wife at bedside.

## 2021-07-27 NOTE — ED Notes (Signed)
Pt w/ back to back seizure-like activity. 2mg  ativan given and notified Tamala Julian MD. Per Tamala Julian MD, he is going to touch base w/ neurology. Will continue to monitor pt.

## 2021-07-27 NOTE — Consult Note (Addendum)
Neurology Consult H&P  Gerald [name] MR# [id number] 07/27/2021   CC: Provoked seizure- 61 year old male with hx of seizures presented to ED with concern for seizure like activity unlike his normal seizures that were predominantly right-sided shaking witnessed by wife. Mag level 1.5 in ED.   History is obtained from: Patient, wife and chart.  HPI: Gerald [name] is a 61 y.o. male PMHx of prior strokes and seizures started 6 months after first stroke, HTN, ETOH, cocaine and tobacco dependence. Today was brought to [facility] ED via EMS after a seizure like episode atypical to his previous seizures. He reports typical seizures start with twitching on the right side of his face and spreads to generalized shaking. Today his seizure was predominately total right sided shaking. He was given 7.5 mg of midazolam by EMS during witnessed episode in ambulance. No evidence of incontinence or tongue biting. His magnesium was 1.5 and likely cause of provoked seizure. ED ordered Mag 2gm IV x 1 and I added a second dose and will recheck Mag level at 11am. He was given 1000mg  Keppra IV in ED and his home dose is 1500mg . Will add 500mg  to total am dose.   EEG showed status epilepticus and patient given Valproate acid 3000mg  IV OTO. Will add Valproate acid 500mg  IV BID scheduled. Magnesium level ordered for 11am and still in process.   ROS: Unable to assess due to sedation, Ativan 2mg  at 0830am and again at 1115am for status epilepticus  Past Medical History:  Diagnosis Date   Alcohol abuse    Cocaine abuse ([place]) 2014   Hypertension    Seizures ([place]) 07/2015   had first seizures about 6 months after stroke   Stroke [facility]) 2014   denies residual on 07/03/2015   Stroke ([place]) 07/02/2015   "now weak on right side; speech problems" (07/03/2015)   Tobacco use      Family History  Problem Relation Age of Onset   Hypertension Mother    Hypertension Father    Stroke Paternal Aunt     Social History:  reports that  he has been smoking cigarettes. He has a 8.50 pack-year smoking history. He has never used smokeless tobacco. He reports current alcohol use of about 1.0 - 2.0 standard drink per week. He reports that he does not use drugs.   Prior to Admission medications   Medication Sig Start Date End Date Taking? Authorizing Provider  acetaminophen (TYLENOL) 500 MG tablet Take 500 mg by mouth every 6 (six) hours as needed (pain).   Yes [provider]  amLODipine (NORVASC) 5 MG tablet Take 5 mg by mouth daily. 09/06/16  Yes [provider]  atorvastatin (LIPITOR) 20 MG tablet Take 1 tablet (20 mg total) by mouth daily at 6 PM. Patient taking differently: Take 20 mg by mouth daily. 09/06/16  Yes Charlott Rakes, MD  baclofen (LIORESAL) 20 MG tablet Take 1 tablet (20 mg total) by mouth 3 (three) times daily. 15mg  AM (1.5 tab), 15mg  (1.5 tab) afternoon and 20mg  (2 tabs) bedtime 01/12/21  Yes McCue, Janett Billow, NP  levETIRAcetam (KEPPRA) 750 MG tablet Take 750 mg by mouth 2 (two) times daily. 04/29/21  Yes [provider]  lisinopril (PRINIVIL,ZESTRIL) 40 MG tablet Take 1 tablet (40 mg total) by mouth daily. 04/20/16  Yes Charlott Rakes, MD  metoprolol tartrate (LOPRESSOR) 25 MG tablet Take 1 tablet (25 mg total) by mouth 2 (two) times daily. 01/28/17  Yes Charlott Rakes, MD  rivaroxaban [name])  20 MG TABS tablet Take 20 mg by mouth daily with supper. 09/13/18  Yes [provider]  sertraline (ZOLOFT) 50 MG tablet Take 1 tablet (50 mg total) by mouth daily. 01/28/17  Yes Charlott Rakes, MD  spironolactone (ALDACTONE) 50 MG tablet Take 50 mg by mouth daily. 09/06/16  Yes [provider]  baclofen (LIORESAL) 10 MG tablet 1 tablet THREE TIMES A DAY (route: oral) Patient not taking: Reported on 07/27/2021 08/24/18   [provider]    Exam: Current vital signs: BP (!) 153/107    Pulse 86    Temp 98.6 F (37 C) (Oral)    Resp 18    Ht 6' (1.829 m)    Wt 109.8 kg    SpO2  92%    BMI 32.83 kg/m   Physical Exam  Constitutional: Appears well-developed and well-nourished.  Eyes: No scleral injection HENT: No OP obstruction. Head: Normocephalic.  Cardiovascular: Normal rate and regular rhythm.  Respiratory: Effort normal, symmetric excursions bilaterally, no audible wheezing. GI: Soft.  No distension. There is no tenderness.  Skin: WDI  Neuro: Mental Status: Patient is sedated and unable to cooperate with exam.Patient is unable to give a clear and coherent history.Wife at bedside gave HPI Speech unable to assess No signs of aphasia or neglect. Pupils are equal, round, and reactive to light. EOMI without ptosis or diploplia.  Facial movement is symmetric.   Tone is normal. Bulk is normal. Non focal and moving all ext.  Sensation unable to assess Toes are downgoing bilaterally. Gait - Deferred  I have reviewed labs in epic and the pertinent results are: Magnesium 1.5 and replaced with Mag 2gm x 2 Ethanol - normal  I have reviewed the images obtained: NCT head showed no acute intracranial abnormalities.Progressive encephalomalacia within the left frontal lobe compatible with remote infarct in the left ACA territory and Chronic small vessel ischemic disease.  EEG completed and patient is in status epilepticus with Frequent brief electrographic seizures arising from the left hemisphere. Ativan 2mg  given.   Assessment: Gerald [name] is a 61 y.o. male PMHx prior strokes and seizures started 6 months after first stroke, HTN, ETOH, cocaine and tobacco dependence. Witnessed seizure activity while compliant on Keppra 1500mg  PO BID.  Wife says patient has not slept during night due to seizures and muscle jerks and Mag level 1.5 in ED.   Stat EEG Abnormalities: 1) Frequent brief electrographic seizures arising from the left hemisphere  Impression:  Provoked seizures by sleep deprivation and/or Mag level 1.5 in patient with seizure disorder.  Plan: -  Seizure precautions - Recommend labs: Repeat Mag s/p second Mag IV run, at 11am, UA, Lactic acid, Ionized calcium, serum drug screen - Mag Ox 400mg  PO daily on discharge - 500mg  Keppra IV stat to equal home am dose and continue  Keppra 1500mg  BID - Valproate acid 3000mg  IV OTO - Valproate acid 500mg  IV BID - Ativan 2mg  PRN Q4hr for seizures - Overnight LT EEG - Follow up outpatient with his Neurologist on discharge - Neurology will follow   Electronically signed by:  Parke Poisson, Neuro Nurse Practitioner I can be reached on Epic secure chat 07/27/2021, 8:57 AM ATTENDING ATTESTATION:  Pt with history of seizures and breakthrough seizure.  Stat EEG in the ED showed status.  This was converted to LTM.  He was loaded with Depakote and Keppra.  His seizure may have been exacerbated by low magnesium as well as sleep deprivation.  Apparently according to his  wife he has not slept well at night.  He is otherwise compliant with his medication.  He was given Versed 7.5 mg by EMS.  He is loaded with Keppra and valproic acid.  We will follow levels and LTM tomorrow.  On exam he is sedated but nonfocal.  This patient is critically ill and at significant risk of neurological worsening, death and care requires constant monitoring of vital signs, hemodynamics,respiratory and cardiac monitoring, neurological assessment, discussion with family, other specialists and medical decision making of high complexity. I spent 50 minutes of neurocritical care time  in the care of  this patient.   Allyah Heather,MD   If 7pm- 7am, please page neurology on call as listed in [place].

## 2021-07-27 NOTE — Procedures (Signed)
History: 61 yo M being evaluated for   Sedation: None  Technique: This EEG was acquired with electrodes placed according to the International 10-20 electrode system (including Fp1, Fp2, F3, F4, C3, C4, P3, P4, O1, O2, T3, T4, T5, T6, A1, A2, Fz, Cz, Pz). The following electrodes were missing or displaced: none.   Background: There is a PDR of 11 Hz that is seen at times as well as low voltage irregular generalized delta range activity. There are 10 electrographic seizures over the course of the 42 minute recording lasting from 18 seconds to 1 minute. 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   Photic stimulation: Physiologic driving is not performed  EEG Abnormalities: 1) Frequent brief electrographic seizures arising from the left hemisphere  Clinical Interpretation: This EEG recorded very frequent electrographic seizures arising from the left hemisphere. This was communicated to the clinical team at the time of interpretation. Medications were ordered and LTM EEG was started.   Roland Rack, MD [facility] [phone]  If 7pm- 7am, please page neurology on call as listed in [place].

## 2021-07-28 DIAGNOSIS — I10 Essential (primary) hypertension: Secondary | ICD-10-CM | POA: Diagnosis present

## 2021-07-28 DIAGNOSIS — R569 Unspecified convulsions: Secondary | ICD-10-CM

## 2021-07-28 DIAGNOSIS — G40101 Localization-related (focal) (partial) symptomatic epilepsy and epileptic syndromes with simple partial seizures, not intractable, with status epilepticus: Secondary | ICD-10-CM | POA: Diagnosis present

## 2021-07-28 DIAGNOSIS — Z23 Encounter for immunization: Secondary | ICD-10-CM | POA: Diagnosis present

## 2021-07-28 DIAGNOSIS — Z8249 Family history of ischemic heart disease and other diseases of the circulatory system: Secondary | ICD-10-CM | POA: Diagnosis not present

## 2021-07-28 DIAGNOSIS — Z79899 Other long term (current) drug therapy: Secondary | ICD-10-CM | POA: Diagnosis not present

## 2021-07-28 DIAGNOSIS — Z20822 Contact with and (suspected) exposure to covid-19: Secondary | ICD-10-CM | POA: Diagnosis present

## 2021-07-28 DIAGNOSIS — F17210 Nicotine dependence, cigarettes, uncomplicated: Secondary | ICD-10-CM | POA: Diagnosis present

## 2021-07-28 DIAGNOSIS — G40901 Epilepsy, unspecified, not intractable, with status epilepticus: Secondary | ICD-10-CM | POA: Diagnosis not present

## 2021-07-28 DIAGNOSIS — Z823 Family history of stroke: Secondary | ICD-10-CM | POA: Diagnosis not present

## 2021-07-28 DIAGNOSIS — I69351 Hemiplegia and hemiparesis following cerebral infarction affecting right dominant side: Secondary | ICD-10-CM | POA: Diagnosis not present

## 2021-07-28 DIAGNOSIS — I69320 Aphasia following cerebral infarction: Secondary | ICD-10-CM | POA: Diagnosis not present

## 2021-07-28 DIAGNOSIS — Z7901 Long term (current) use of anticoagulants: Secondary | ICD-10-CM | POA: Diagnosis not present

## 2021-07-28 DIAGNOSIS — M7989 Other specified soft tissue disorders: Secondary | ICD-10-CM | POA: Diagnosis not present

## 2021-07-28 DIAGNOSIS — R4182 Altered mental status, unspecified: Secondary | ICD-10-CM | POA: Diagnosis present

## 2021-07-28 LAB — CBC
HCT: 43.5 % (ref 39.0–52.0)
Hemoglobin: 14.2 g/dL (ref 13.0–17.0)
MCH: 31.4 pg (ref 26.0–34.0)
MCHC: 32.6 g/dL (ref 30.0–36.0)
MCV: 96.2 fL (ref 80.0–100.0)
Platelets: 226 10*3/uL (ref 150–400)
RBC: 4.52 MIL/uL (ref 4.22–5.81)
RDW: 12.8 % (ref 11.5–15.5)
WBC: 9.2 10*3/uL (ref 4.0–10.5)
nRBC: 0 % (ref 0.0–0.2)

## 2021-07-28 LAB — LIPID PANEL
Cholesterol: 91 mg/dL (ref 0–200)
HDL: 34 mg/dL — ABNORMAL LOW (ref >40)
LDL Cholesterol: 47 mg/dL (ref 0–99)
Total CHOL/HDL Ratio: 2.7 RATIO
Triglycerides: 50 mg/dL (ref <150)
VLDL: 10 mg/dL (ref 0–40)

## 2021-07-28 LAB — HIV ANTIBODY (ROUTINE TESTING W REFLEX): HIV Screen 4th Generation wRfx: NONREACTIVE

## 2021-07-28 LAB — MAGNESIUM: Magnesium: 1.9 mg/dL (ref 1.7–2.4)

## 2021-07-28 LAB — BASIC METABOLIC PANEL
Anion gap: 10 (ref 5–15)
BUN: 6 mg/dL (ref 6–20)
CO2: 25 mmol/L (ref 22–32)
Calcium: 8.8 mg/dL — ABNORMAL LOW (ref 8.9–10.3)
Chloride: 101 mmol/L (ref 98–111)
Creatinine, Ser: 0.82 mg/dL (ref 0.61–1.24)
GFR, Estimated: >60 mL/min (ref >60)
Glucose, Bld: 95 mg/dL (ref 70–99)
Potassium: 3.9 mmol/L (ref 3.5–5.1)
Sodium: 136 mmol/L (ref 135–145)

## 2021-07-28 LAB — HEMOGLOBIN A1C
Hgb A1c MFr Bld: 5.1 % (ref 4.8–5.6)
Mean Plasma Glucose: 99.67 mg/dL

## 2021-07-28 LAB — VALPROIC ACID LEVEL: Valproic Acid Lvl: 69 ug/mL (ref 50.0–100.0)

## 2021-07-28 MED ORDER — MAGNESIUM SULFATE 4 GM/100ML IV SOLN
4.0000 g | Freq: Once | INTRAVENOUS | Status: AC
Start: 2021-07-28 — End: 2021-07-29
  Administered 2021-07-28: 4 g via INTRAVENOUS
  Filled 2021-07-28: qty 100

## 2021-07-28 MED ORDER — METOPROLOL TARTRATE 5 MG/5ML IV SOLN
2.5000 mg | Freq: Four times a day (QID) | INTRAVENOUS | Status: DC
  Administered 2021-07-28 – 2021-07-29 (×5): 2.5 mg via INTRAVENOUS
  Filled 2021-07-28 (×5): qty 5

## 2021-07-28 MED ORDER — SODIUM CHLORIDE 0.9 % IV SOLN
150.0000 mg | Freq: Two times a day (BID) | INTRAVENOUS | Status: DC
  Filled 2021-07-28 (×2): qty 15

## 2021-07-28 MED ORDER — SODIUM CHLORIDE 0.9 % IV SOLN
150.0000 mg | Freq: Two times a day (BID) | INTRAVENOUS | Status: AC
  Administered 2021-07-28 – 2021-07-29 (×4): 150 mg via INTRAVENOUS
  Filled 2021-07-28 (×5): qty 15

## 2021-07-28 MED ORDER — VALPROATE SODIUM 100 MG/ML IV SOLN
500.0000 mg | Freq: Three times a day (TID) | INTRAVENOUS | Status: DC
  Administered 2021-07-29 (×3): 500 mg via INTRAVENOUS
  Filled 2021-07-28 (×5): qty 5

## 2021-07-28 MED ORDER — HYDRALAZINE HCL 20 MG/ML IJ SOLN
10.0000 mg | Freq: Four times a day (QID) | INTRAMUSCULAR | Status: DC | PRN
  Administered 2021-07-29: 10 mg via INTRAVENOUS
  Filled 2021-07-28: qty 1

## 2021-07-28 NOTE — Progress Notes (Signed)
LTM maintenance performed. No skin breakdown noted. Test button was tested.

## 2021-07-28 NOTE — Progress Notes (Addendum)
Neurology Progress Note DELONTAE LAMM MR# [id number] 07/28/2021   S: Feels better and less seizures overnight. He says his right arm is weak after each seizure and he cannot move it even though it twitches with each seizure. We discussed post ictal phase of seizures and he verbalized understanding.   Spoke with significant other [name] via phone and gave her an update. She is worried about him not eating and I have let her know we recommended an NGT yesterday. She says he has had swallowing issues in the past and even has had his esophagus stretched. Speech at bedside and he failed swallow eval.I discussed our prior recommendation for NGT that was unable to be placed in ED with bedside RN as well and will reorder for [facility] to re-attempt to place. Marland Kitchen He also needs his stroke prevention meds and will need and NGT to receive them at this time.    O: Current vital signs: BP (!) 175/95 (BP Location: Right Arm)    Pulse 78    Temp 98.5 F (36.9 C) (Oral)    Resp 18    Ht 6' (1.829 m)    Wt 109.8 kg    SpO2 100%    BMI 32.83 kg/m  Vital signs in last 24 hours: Temp:  [97.6 F (36.4 C)-98.5 F (36.9 C)] 98.5 F (36.9 C) (02/14 0800) Pulse Rate:  [77-98] 78 (02/14 0800) Resp:  [14-27] 18 (02/14 0800) BP: (111-183)/(64-109) 175/95 (02/14 0800) SpO2:  [88 %-100 %] 100 % (02/14 0800) Weight:  [109.8 kg] 109.8 kg (02/13 0835) GENERAL: Awake, and alert . Opens eyes to voice HEENT: Normocephalic and atraumatic, moist mm, no LN++, no thyromegaly LUNGS: symmetric excursions bilaterally with no audible wheezes. CV: RR, equal pulses bilaterally. ABDOMEN: Soft, nontender, nondistended with normoactive BS Ext: warm, well perfused, intact peripheral pulses   NEURO:  Mental Status: AA&O except did not know month/season and reminded of the date. Language: speech is fluent.  Intact naming, repetition, and comprehension. PERR. EOMI, visual fields full, no facial asymmetry, facial sensation intact, hearing  intact. No evidence of tongue atrophy or fibrillations, tongue/uvula/soft palate midline elevates symmetrically  Motor: 4/5 strength in all extremities except 0/5 in right arm postictally Tone: Tone and bulk is normal Sensation: Intact to light touch bilaterally Coordination: no ataxia observed Gait - Deferred  I have reviewed labs in epic and the pertinent results are: Magnesium 1.9 and replaced with Mag 4gm x 1 Calcium 8.8 and slightly low, will watch UA- no concern for UTI, Lactic acid- 0.9  Ionized calcium, serum drug screen- pending    Component Value Date/Time   WBC 9.2 07/28/2021 0502   RBC 4.52 07/28/2021 0502   HGB 14.2 07/28/2021 0502   HCT 43.5 07/28/2021 0502   PLT 226 07/28/2021 0502   MCV 96.2 07/28/2021 0502   MCH 31.4 07/28/2021 0502   MCHC 32.6 07/28/2021 0502   RDW 12.8 07/28/2021 0502   LYMPHSABS 3.3 07/27/2021 0330   MONOABS 0.7 07/27/2021 0330   EOSABS 0.2 07/27/2021 0330   BASOSABS 0.1 07/27/2021 0330       Component Value Date/Time   NA 136 07/28/2021 0502   K 3.9 07/28/2021 0502   CL 101 07/28/2021 0502   CO2 25 07/28/2021 0502   GLUCOSE 95 07/28/2021 0502   BUN 6 07/28/2021 0502   CREATININE 0.82 07/28/2021 0502   CREATININE 0.94 04/20/2016 1022   CALCIUM 8.8 (L) 07/28/2021 0502   PROT 7.4 07/27/2021 0330  ALBUMIN 3.6 07/27/2021 0330   AST 20 07/27/2021 0330   ALT 16 07/27/2021 0330   ALKPHOS 54 07/27/2021 0330   BILITOT 0.7 07/27/2021 0330   GFRNONAA >60 07/28/2021 0502   GFRNONAA >89 04/20/2016 1022   GFRAA >60 05/22/2016 0937   GFRAA >89 04/20/2016 1022    Medications: Scheduled Meds:  amLODipine  5 mg Oral Daily   atorvastatin  20 mg Oral Daily   lisinopril  40 mg Oral Daily   magnesium oxide  400 mg Oral Daily   metoprolol tartrate  25 mg Oral BID   rivaroxaban  20 mg Oral Q supper   sodium chloride flush  3 mL Intravenous Q12H   Continuous Infusions:  sodium chloride 75 mL/hr at 07/27/21 1512   lacosamide (VIMPAT) IV      levETIRAcetam 1,500 mg (07/27/21 2348)   magnesium sulfate bolus IVPB     valproate sodium 500 mg (07/28/21 0037)   PRN Meds:.acetaminophen **OR** acetaminophen, albuterol, LORazepam  Imaging I have reviewed images in epic and the results pertinent to this consultation are: [facility] showed no acute intracranial abnormalities.Progressive encephalomalacia within the left frontal lobe compatible with remote infarct in the left ACA territory and Chronic small vessel ischemic disease. 2.   Long term EEG showed recurrent brief focal motor seizures arising from the left central region. Seizures appeared improved overnight.     Assessment: ENGLISH CRAIGHEAD is a 61 y.o. male PMHx prior strokes and seizures started 6 months after first stroke, HTN, ETOH, cocaine and tobacco dependence. Witnessed seizure activity while compliant on Keppra 1500mg  PO BID.  Wife says patient has not slept during night due to seizures and muscle jerks and Mag level 1.5 in ED.   Impression:  Provoked seizures by sleep deprivation and/or Mag level 1.5 in patient with seizure disorder.  LTM 2/13-14: Impression: This was an abnormal continuous video EEG due to recurrent brief focal motor seizures arising from the left central region. Seizures appeared improved overnight.   Plan: - Recommended NGT for nutrition and stroke prevention meds yesterday with ED MD and ED staff unable to place. Will need to be re-attempted by [facility].  - Continue Seizure precautions - Recommend labs: Mag daily and replace mag to optimize level at 2.0 - Mag Ox 400mg  PO daily continue at discharge - Continue  Keppra 1500mg  BID - Increase Valproate acid 500mg  IV TID (check levels) - Increased Vimpat to 150mg  IV BID  - Ativan 2mg  PRN Q4hr for seizures - Continue LT EEG - Neurology will follow    Electronically signed by:  Parke Poisson, Neurology NP Can be reached on Epic Secure Messenger 07/28/2021, 8:35 AM   ATTENDING  ATTESTATION:  Ongoing focal seizures despite 3 AEDs. Vimpat increased today. Will increase VPA to 500mg  TID. Cont' LTM  Dr. Reeves Forth evaluated pt independently, reviewed imaging, chart, labs. Discussed and formulated plan with the APP. Please see APP note above for details.   Total 36 minutes spent on counseling patient and coordinating care, writing notes and reviewing chart.  Jehieli Brassell,MD  If 7pm- 7am, please page neurology on call as listed in [place].

## 2021-07-28 NOTE — Procedures (Signed)
EEG Procedure CPT/Type of Study: 05697; 24hr EEG with video Referring Provider: Tamala Julian Primary Neurological Diagnosis: focal motor status  History: This is a 61 yr old patient, undergoing an EEG to evaluate for focal motor seizures. Clinical State: obtunded  Technical Description:  The EEG was performed using standard setting per the guidelines of American Clinical Neurophysiology Society (ACNS).  A minimum of 21 electrodes were placed on scalp according to the International 10-20 or/and 10-10 Systems. Supplemental electrodes were placed as needed. Single EKG electrode was also used to detect cardiac arrhythmia. Patient's behavior was continuously recorded on video simultaneously with EEG. A minimum of 16 channels were used for data display. Each epoch of study was reviewed manually daily and as needed using standard referential and bipolar montages. Computerized quantitative EEG analysis (such as compressed spectral array analysis, trending, automated spike & seizure detection) were used as indicated.   Day 1: from 1143 07/27/21 to 0730 07/28/21  EEG Description: Overall Amplitude:Normal Predominant Frequency: The background activity showed theta, with about 4-5 Hz, that was frequent. Superimposed Frequencies: sparse beta activity bilaterally The background was symmetric  Background Abnormalities: Generalized slowing Rhythmic or periodic pattern: No Epileptiform activity: Yes; left central spikes Electrographic seizures: Yes; recurrent brief (30sec) left central onset subtle clinical seizures with onset of 15hz  fast activity with spread through the left hemisphere. Some head jerking and right hand clonic jerking was observed. These occurred every hour or so but appeared improved after midnight.  Events: no   Breach rhythm: no  Reactivity: Present  Stimulation procedures:  Hyperventilation: not done Photic stimulation: not done  Sleep Background: Stage II  EKG:no significant  arrhythmia  Impression: This was an abnormal continuous video EEG due to recurrent brief focal motor seizures arising from the left central region. Seizures appeared improved overnight.

## 2021-07-28 NOTE — Progress Notes (Signed)
LTM maint complete - irritation under Fp1 lead adjusted Serviced O2 F7 A1 [facility] monitored, Event button test confirmed by [facility].

## 2021-07-28 NOTE — Progress Notes (Signed)
Progress Note   Patient: Gerald [name] EHU:[id number] DOB: 09/15/1960 DOA: 07/27/2021     0 DOS: the patient was seen and examined on 07/28/2021   Brief hospital course: Gerald [name] is a 61 y.o. male with medical history significant of CVA, focal seizures, and polysubstance abuse who presented with complaints of seizures.  Assessment and Plan: * Status epilepticus [facility]) Patient with prior history of focal seizures following prior stroke.  Medication regimen includes Keppra 750 mg twice daily.  EEG monitoring noted frequent electrographic seizures arising from left hemisphere.  Patient had initally been loaded with Keppra and Depakote was added.  Due to persistent seizure activity neurology has been notified and on Vimpat. -Admit to progressive bed -Seizure precautions -Continuous pulse oximetry monitoring nasal cannula oxygen to maintain O2 saturation greater than 92% -N.p.o. and orders placed for NGT to give meds -Long-term EEG monitoring -Ativan IV as needed for seizure activity -IV Keppra, Depacon, and Vimpat due to persistent seizure activity per neurology -Normal saline IV fluids at 75 mL/h -Appreciate neurology consultative services we will follow for any further recommendations - pt remains somnolent. SLP eval recommended NPO , has mod aspiration risk.    Hypomagnesemia- (present on admission) Replaced. Repeat in am.   History of stroke Patient with a prior history of strokes with residual deficits including right spastic hemiparesis, aphasia, and dysarthria. -Continue Xarelto and atorvastatin once able to take by mouth.   Essential hypertension- (present on admission) Sub optimal BP parameters.  Amlodipine, lisinopril and metoprolol are on hold as pt is NPO.  Continue with prn hydralazine and metoprolol.        Subjective: somnolent.  Physical Exam: Vitals:   07/27/21 2354 07/28/21 0357 07/28/21 0800 07/28/21 1128  BP: (!) 160/101 (!) 155/85 (!) 175/95 (!)  166/88  Pulse: 98 78 78 74  Resp: 19 16 18 18   Temp: 98.1 F (36.7 C) 97.6 F (36.4 C) 98.5 F (36.9 C) 98.6 F (37 C)  TempSrc: Axillary Oral Oral   SpO2: 99% 99% 100% 99%  Weight:      Height:       General exam:somnolent.  Respiratory system: Clear to auscultation. Respiratory effort normal. Cardiovascular system: S1 & S2 heard, RRR. No JVD,  No pedal edema. Gastrointestinal system: Abdomen is nondistended, soft and nontender. Normal bowel sounds heard. Central nervous system: somnolent. Able to move all extremities.  Extremities: no pedal edema.  Skin: No rashes, . Psychiatry:  Mood & affect appropriate.    Data Reviewed: Results for orders placed or performed during the hospital encounter of 07/27/21 (from the past 24 hour(s))  Lactic acid, plasma     Status: None   Collection Time: 07/27/21  4:00 PM  Result Value Ref Range   Lactic Acid, Venous 1.8 0.5 - 1.9 mmol/L  Resp Panel by RT-PCR (Flu A&B, Covid) Nasopharyngeal Swab     Status: None   Collection Time: 07/27/21  5:58 PM   Specimen: Nasopharyngeal Swab; Nasopharyngeal(NP) swabs in vial transport medium  Result Value Ref Range   SARS Coronavirus 2 by RT PCR NEGATIVE NEGATIVE   Influenza A by PCR NEGATIVE NEGATIVE   Influenza B by PCR NEGATIVE NEGATIVE  HIV Antibody (routine testing w rflx)     Status: None   Collection Time: 07/28/21  5:02 AM  Result Value Ref Range   HIV Screen 4th Generation wRfx Non Reactive Non Reactive  CBC     Status: None   Collection Time: 07/28/21  5:02 AM  Result Value Ref Range   WBC 9.2 4.0 - 10.5 K/uL   RBC 4.52 4.22 - 5.81 MIL/uL   Hemoglobin 14.2 13.0 - 17.0 g/dL   HCT 43.5 39.0 - 52.0 %   MCV 96.2 80.0 - 100.0 fL   MCH 31.4 26.0 - 34.0 pg   MCHC 32.6 30.0 - 36.0 g/dL   RDW 12.8 11.5 - 15.5 %   Platelets 226 150 - 400 K/uL   nRBC 0.0 0.0 - 0.2 %  Basic metabolic panel     Status: Abnormal   Collection Time: 07/28/21  5:02 AM  Result Value Ref Range   Sodium 136 135 -  145 mmol/L   Potassium 3.9 3.5 - 5.1 mmol/L   Chloride 101 98 - 111 mmol/L   CO2 25 22 - 32 mmol/L   Glucose, Bld 95 70 - 99 mg/dL   BUN 6 6 - 20 mg/dL   Creatinine, Ser 0.82 0.61 - 1.24 mg/dL   Calcium 8.8 (L) 8.9 - 10.3 mg/dL   GFR, Estimated >60 >60 mL/min   Anion gap 10 5 - 15  Magnesium     Status: None   Collection Time: 07/28/21  5:02 AM  Result Value Ref Range   Magnesium 1.9 1.7 - 2.4 mg/dL  Lipid panel     Status: Abnormal   Collection Time: 07/28/21  5:02 AM  Result Value Ref Range   Cholesterol 91 0 - 200 mg/dL   Triglycerides 50 <150 mg/dL   HDL 34 (L) >40 mg/dL   Total CHOL/HDL Ratio 2.7 RATIO   VLDL 10 0 - 40 mg/dL   LDL Cholesterol 47 0 - 99 mg/dL  Hemoglobin A1c     Status: None   Collection Time: 07/28/21  5:02 AM  Result Value Ref Range   Hgb A1c MFr Bld 5.1 4.8 - 5.6 %   Mean Plasma Glucose 99.67 mg/dL  Valproic acid level     Status: None   Collection Time: 07/28/21  1:03 PM  Result Value Ref Range   Valproic Acid Lvl 69 50.0 - 100.0 ug/mL     Family Communication: none at bedside.   Disposition: Status is: Inpatient Remains inpatient appropriate because: recurrent seizures          Planned Discharge Destination: Skilled nursing facility     Time spent: 42  minutes  Author: Hosie Poisson, MD 07/28/2021 2:42 PM  For on call review [email or web address].

## 2021-07-28 NOTE — Plan of Care (Signed)
Pt is now alert oriented x 2. Pt has had multiple short lasting seizure, head shaking and right arm twitching, oral care provided. Seizure precautions in place.   Problem: Education: Goal: Knowledge of General Education information will improve Description: Including pain rating scale, medication(s)/side effects and non-pharmacologic comfort measures Outcome: Progressing   Problem: Health Behavior/Discharge Planning: Goal: Ability to manage health-related needs will improve Outcome: Progressing   Problem: Clinical Measurements: Goal: Ability to maintain clinical measurements within normal limits will improve Outcome: Progressing Goal: Will remain free from infection Outcome: Progressing Goal: Diagnostic test results will improve Outcome: Progressing Goal: Respiratory complications will improve Outcome: Progressing Goal: Cardiovascular complication will be avoided Outcome: Progressing   Problem: Activity: Goal: Risk for activity intolerance will decrease Outcome: Progressing   Problem: Nutrition: Goal: Adequate nutrition will be maintained Outcome: Progressing   Problem: Coping: Goal: Level of anxiety will decrease Outcome: Progressing   Problem: Elimination: Goal: Will not experience complications related to bowel motility Outcome: Progressing Goal: Will not experience complications related to urinary retention Outcome: Progressing   Problem: Elimination: Goal: Will not experience complications related to urinary retention Outcome: Progressing   Problem: Pain Managment: Goal: General experience of comfort will improve Outcome: Progressing   Problem: Safety: Goal: Ability to remain free from injury will improve Outcome: Progressing   Problem: Skin Integrity: Goal: Risk for impaired skin integrity will decrease Outcome: Progressing

## 2021-07-28 NOTE — Plan of Care (Signed)
Pt is alert oriented x 2. Pt has had multiple seizures, pt given prn ativan. Pt receiving IV seizure medications. Pt has male purewick in place, oral care provided. Pts Wife Audie Pinto called to ask admin questions. Pts wife states he is wheel chair bound and the right arm weakness is new with these seizures.   Problem: Education: Goal: Knowledge of General Education information will improve Description: Including pain rating scale, medication(s)/side effects and non-pharmacologic comfort measures 07/28/2021 0839 by Allayne Stack, RN Outcome: Progressing 07/28/2021 0134 by Allayne Stack, RN Outcome: Progressing   Problem: Health Behavior/Discharge Planning: Goal: Ability to manage health-related needs will improve 07/28/2021 0839 by Allayne Stack, RN Outcome: Progressing 07/28/2021 0134 by Allayne Stack, RN Outcome: Progressing   Problem: Clinical Measurements: Goal: Ability to maintain clinical measurements within normal limits will improve 07/28/2021 0839 by Allayne Stack, RN Outcome: Progressing 07/28/2021 0134 by Allayne Stack, RN Outcome: Progressing Goal: Will remain free from infection 07/28/2021 0839 by Allayne Stack, RN Outcome: Progressing 07/28/2021 0134 by Allayne Stack, RN Outcome: Progressing Goal: Diagnostic test results will improve 07/28/2021 0839 by Allayne Stack, RN Outcome: Progressing 07/28/2021 0134 by Allayne Stack, RN Outcome: Progressing Goal: Respiratory complications will improve 07/28/2021 0839 by Allayne Stack, RN Outcome: Progressing 07/28/2021 0134 by Allayne Stack, RN Outcome: Progressing Goal: Cardiovascular complication will be avoided 07/28/2021 0839 by Allayne Stack, RN Outcome: Progressing 07/28/2021 0134 by Allayne Stack, RN Outcome: Progressing   Problem: Activity: Goal: Risk for activity intolerance will decrease 07/28/2021 0839 by Allayne Stack, RN Outcome:  Progressing 07/28/2021 0134 by Allayne Stack, RN Outcome: Progressing   Problem: Nutrition: Goal: Adequate nutrition will be maintained 07/28/2021 0839 by Allayne Stack, RN Outcome: Progressing 07/28/2021 0134 by Allayne Stack, RN Outcome: Progressing   Problem: Coping: Goal: Level of anxiety will decrease 07/28/2021 0839 by Allayne Stack, RN Outcome: Progressing 07/28/2021 0134 by Allayne Stack, RN Outcome: Progressing   Problem: Elimination: Goal: Will not experience complications related to bowel motility 07/28/2021 0839 by Allayne Stack, RN Outcome: Progressing 07/28/2021 0134 by Allayne Stack, RN Outcome: Progressing Goal: Will not experience complications related to urinary retention 07/28/2021 0839 by Allayne Stack, RN Outcome: Progressing 07/28/2021 0134 by Allayne Stack, RN Outcome: Progressing   Problem: Pain Managment: Goal: General experience of comfort will improve 07/28/2021 0839 by Allayne Stack, RN Outcome: Progressing 07/28/2021 0134 by Allayne Stack, RN Outcome: Progressing   Problem: Safety: Goal: Ability to remain free from injury will improve 07/28/2021 0839 by Allayne Stack, RN Outcome: Progressing 07/28/2021 0134 by Allayne Stack, RN Outcome: Progressing   Problem: Skin Integrity: Goal: Risk for impaired skin integrity will decrease 07/28/2021 0839 by Allayne Stack, RN Outcome: Progressing 07/28/2021 0134 by Allayne Stack, RN Outcome: Progressing
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
## 2021-07-28 NOTE — Progress Notes (Signed)
°  Transition of Care [facility]) Screening Note   Patient Details  Name: Gerald [name] Date of Birth: Apr 19, 1961   Transition of Care [facility]) CM/SW Contact:    Pollie Friar, RN Phone Number: 07/28/2021, 4:26 PM    Transition of Care Department [facility]) has reviewed patient and no TOC needs have been identified at this time. We will continue to monitor patient advancement through interdisciplinary progression rounds. If new patient transition needs arise, please place a TOC consult.

## 2021-07-29 LAB — CBC WITH DIFFERENTIAL/PLATELET
Abs Immature Granulocytes: 0.04 10*3/uL (ref 0.00–0.07)
Basophils Absolute: 0 10*3/uL (ref 0.0–0.1)
Basophils Relative: 1 %
Eosinophils Absolute: 0.1 10*3/uL (ref 0.0–0.5)
Eosinophils Relative: 1 %
HCT: 41.1 % (ref 39.0–52.0)
Hemoglobin: 13.7 g/dL (ref 13.0–17.0)
Immature Granulocytes: 1 %
Lymphocytes Relative: 22 %
Lymphs Abs: 1.9 10*3/uL (ref 0.7–4.0)
MCH: 31.6 pg (ref 26.0–34.0)
MCHC: 33.3 g/dL (ref 30.0–36.0)
MCV: 94.7 fL (ref 80.0–100.0)
Monocytes Absolute: 0.7 10*3/uL (ref 0.1–1.0)
Monocytes Relative: 8 %
Neutro Abs: 6 10*3/uL (ref 1.7–7.7)
Neutrophils Relative %: 67 %
Platelets: 262 10*3/uL (ref 150–400)
RBC: 4.34 MIL/uL (ref 4.22–5.81)
RDW: 12.3 % (ref 11.5–15.5)
WBC: 8.8 10*3/uL (ref 4.0–10.5)
nRBC: 0 % (ref 0.0–0.2)

## 2021-07-29 LAB — MAGNESIUM: Magnesium: 2.2 mg/dL (ref 1.7–2.4)

## 2021-07-29 LAB — BASIC METABOLIC PANEL
Anion gap: 7 (ref 5–15)
BUN: 5 mg/dL — ABNORMAL LOW (ref 6–20)
CO2: 26 mmol/L (ref 22–32)
Calcium: 8.5 mg/dL — ABNORMAL LOW (ref 8.9–10.3)
Chloride: 101 mmol/L (ref 98–111)
Creatinine, Ser: 0.86 mg/dL (ref 0.61–1.24)
GFR, Estimated: >60 mL/min (ref >60)
Glucose, Bld: 92 mg/dL (ref 70–99)
Potassium: 5.3 mmol/L — ABNORMAL HIGH (ref 3.5–5.1)
Sodium: 134 mmol/L — ABNORMAL LOW (ref 135–145)

## 2021-07-29 LAB — MRSA NEXT GEN BY PCR, NASAL: MRSA by PCR Next Gen: NOT DETECTED

## 2021-07-29 LAB — CALCIUM, IONIZED: Calcium, Ionized, Serum: 5 mg/dL (ref 4.5–5.6)

## 2021-07-29 MED ORDER — LACOSAMIDE 50 MG PO TABS
150.0000 mg | ORAL_TABLET | Freq: Two times a day (BID) | ORAL | Status: DC
  Administered 2021-07-30: 150 mg via ORAL
  Filled 2021-07-29: qty 3

## 2021-07-29 MED ORDER — DIVALPROEX SODIUM ER 500 MG PO TB24
1500.0000 mg | ORAL_TABLET | Freq: Every day | ORAL | Status: DC
  Administered 2021-07-29: 1500 mg via ORAL
  Filled 2021-07-29: qty 3

## 2021-07-29 MED ORDER — METOPROLOL TARTRATE 25 MG PO TABS
25.0000 mg | ORAL_TABLET | Freq: Two times a day (BID) | ORAL | Status: DC
  Administered 2021-07-29 – 2021-07-30 (×2): 25 mg via ORAL
  Filled 2021-07-29 (×2): qty 1

## 2021-07-29 MED ORDER — LEVETIRACETAM 750 MG PO TABS
1500.0000 mg | ORAL_TABLET | Freq: Two times a day (BID) | ORAL | Status: DC
  Administered 2021-07-30: 1500 mg via ORAL
  Filled 2021-07-29: qty 2

## 2021-07-29 MED ORDER — COVID-19 MRNA VAC-TRIS(PFIZER) 30 MCG/0.3ML IM SUSP
0.3000 mL | Freq: Once | INTRAMUSCULAR | Status: AC
  Administered 2021-07-30: 0.3 mL via INTRAMUSCULAR
  Filled 2021-07-29: qty 0.3

## 2021-07-29 MED ORDER — ENSURE ENLIVE PO LIQD
237.0000 mL | Freq: Two times a day (BID) | ORAL | Status: DC
  Administered 2021-07-29 – 2021-07-30 (×2): 237 mL via ORAL

## 2021-07-29 MED ORDER — ADULT MULTIVITAMIN W/MINERALS CH
1.0000 | ORAL_TABLET | Freq: Every day | ORAL | Status: DC
  Administered 2021-07-29 – 2021-07-30 (×2): 1 via ORAL
  Filled 2021-07-29 (×2): qty 1

## 2021-07-29 MED ORDER — INFLUENZA VAC A&B SA ADJ QUAD 0.5 ML IM PRSY
0.5000 mL | PREFILLED_SYRINGE | INTRAMUSCULAR | Status: AC | PRN
  Administered 2021-07-30: 0.5 mL via INTRAMUSCULAR
  Filled 2021-07-29: qty 0.5

## 2021-07-29 NOTE — Progress Notes (Signed)
Speech Language Pathology Treatment: Dysphagia  Patient Details Name: Gerald [name] MRN: [id number] DOB: 1961-04-26 Today's Date: 07/29/2021 Time: 3295-1884 SLP Time Calculation (min) (ACUTE ONLY): 20 min  Assessment / Plan / Recommendation Clinical Impression  Pt was seen for dysphagia treatment. He was adequately alert and cooperative with his wife present. Pt kept his eyes closed for the majority of the majority of the session, but he actively participated and intermittently self-fed. Pt tolerated purees, regular texture solids, and thin liquids via cup without overt s/sx of aspiration. Pt was also able to tolerate meds (given by Merry Proud, RN) whole with thin liquids via cup without overt s/sx of aspiration. Coughing was noted once with thin liquids via straw. Mastication was prolonged, but oral clearance was adequate. A dysphagia 3 diet with thin liquids will be initiated at this time with observance of swallowing precautions. SLP will continue to follow pt.     HPI HPI: Gerald [name] is a 61 y.o. male PMHx prior strokes and seizures started 6 months after first stroke, HTN, ETOH, cocaine and tobacco dependence. Witnessed seizure activity while compliant on Keppra 1500mg  PO BID.  Wife says patient has not slept during night due to seizures and muscle jerks      SLP Plan  Continue with current plan of care      Recommendations for follow up therapy are one component of a multi-disciplinary discharge planning process, led by the attending physician.  Recommendations may be updated based on patient status, additional functional criteria and insurance authorization.    Recommendations  Diet recommendations: Dysphagia 3 (mechanical soft);Thin liquid Liquids provided via: Cup;No straw Medication Administration: Whole meds with liquid Supervision: Staff to assist with self feeding Compensations: Slow rate;Small sips/bites Postural Changes and/or Swallow Maneuvers: Seated upright 90 degrees                 Oral Care Recommendations: Oral care BID Follow Up Recommendations: No SLP follow up SLP Visit Diagnosis: Dysphagia, oropharyngeal phase (R13.12) Plan: Continue with current plan of care         Franchelle Foskett I. Hardin Negus, [place], [place] Office number [phone] Pager [place]  07/29/2021, 9:30 AM

## 2021-07-29 NOTE — Progress Notes (Signed)
Neurology Progress Note BOND GRIESHOP MR# [id number] 07/29/2021   S: no more seizures after the one yesterday. Off LTM. His sister is in the room.  He is almost at baseline except for some mental slowing.   O: Current vital signs: BP (!) 141/94 (BP Location: Right Arm)    Pulse 83    Temp 98 F (36.7 C)    Resp 18    Ht 6' (1.829 m)    Wt 109.8 kg    SpO2 96%    BMI 32.83 kg/m  Vital signs in last 24 hours: Temp:  [97.9 F (36.6 C)-98.5 F (36.9 C)] 98 F (36.7 C) (02/15 1559) Pulse Rate:  [76-95] 83 (02/15 1559) Resp:  [16-20] 18 (02/15 1559) BP: (141-160)/(90-114) 141/94 (02/15 1559) SpO2:  [95 %-100 %] 96 % (02/15 1559) GENERAL: Awake, and alert .  Sitting up in chair.  HEENT: Normocephalic and atraumatic, moist mm, no LN++, no thyromegaly LUNGS: symmetric excursions bilaterally with no audible wheezes. CV: RR, equal pulses bilaterally. ABDOMEN: Soft, nontender, nondistended with normoactive BS Ext: warm, well perfused, intact peripheral pulses   NEURO:  Mental Status: Orientated to person, place , year. Knew his sister by name. Slow to answer questions and his some baseline dysarthria. PERR. EOMI, visual fields full, no facial asymmetry, facial sensation intact, hearing intact. No evidence of tongue atrophy or fibrillations, tongue/uvula/soft palate midline elevates symmetrically  Motor: 4/5 strength in all extremities except 0/5 in right arm postictally Tone: Tone and bulk is normal Sensation: Intact to light touch bilaterally Coordination: no ataxia observed Gait - Deferred  I have reviewed labs in epic and the pertinent results are: Magnesium 1.9 and replaced with Mag 4gm x 1 Calcium 8.8 and slightly low, will watch UA- no concern for UTI, Lactic acid- 0.9  Ionized calcium, serum drug screen- pending    Component Value Date/Time   WBC 8.8 07/29/2021 0346   RBC 4.34 07/29/2021 0346   HGB 13.7 07/29/2021 0346   HCT 41.1 07/29/2021 0346   PLT 262 07/29/2021  0346   MCV 94.7 07/29/2021 0346   MCH 31.6 07/29/2021 0346   MCHC 33.3 07/29/2021 0346   RDW 12.3 07/29/2021 0346   LYMPHSABS 1.9 07/29/2021 0346   MONOABS 0.7 07/29/2021 0346   EOSABS 0.1 07/29/2021 0346   BASOSABS 0.0 07/29/2021 0346       Component Value Date/Time   NA 134 (L) 07/29/2021 0346   K 5.3 (H) 07/29/2021 0346   CL 101 07/29/2021 0346   CO2 26 07/29/2021 0346   GLUCOSE 92 07/29/2021 0346   BUN 5 (L) 07/29/2021 0346   CREATININE 0.86 07/29/2021 0346   CREATININE 0.94 04/20/2016 1022   CALCIUM 8.5 (L) 07/29/2021 0346   PROT 7.4 07/27/2021 0330   ALBUMIN 3.6 07/27/2021 0330   AST 20 07/27/2021 0330   ALT 16 07/27/2021 0330   ALKPHOS 54 07/27/2021 0330   BILITOT 0.7 07/27/2021 0330   GFRNONAA >60 07/29/2021 0346   GFRNONAA >89 04/20/2016 1022   GFRAA >60 05/22/2016 0937   GFRAA >89 04/20/2016 1022    Medications: Scheduled Meds:  amLODipine  5 mg Oral Daily   atorvastatin  20 mg Oral Daily   [START ON 07/30/2021] COVID-19 mRNA Vac-TriS ([employer])  0.3 mL Intramuscular Once   feeding supplement  237 mL Oral BID BM   lisinopril  40 mg Oral Daily   metoprolol tartrate  2.5 mg Intravenous Q6H   multivitamin with minerals  1 tablet Oral Daily  rivaroxaban  20 mg Oral Q supper   sodium chloride flush  3 mL Intravenous Q12H   Continuous Infusions:  sodium chloride 75 mL/hr at 07/28/21 2324   lacosamide (VIMPAT) IV 150 mg (07/29/21 1033)   levETIRAcetam 1,500 mg (07/29/21 0914)   valproate sodium 500 mg (07/29/21 1431)   PRN Meds:.acetaminophen **OR** acetaminophen, albuterol, hydrALAZINE, influenza vaccine adjuvanted, LORazepam  Imaging I have reviewed images in epic and the results pertinent to this consultation are: [facility] showed no acute intracranial abnormalities.Progressive encephalomalacia within the left frontal lobe compatible with remote infarct in the left ACA territory and Chronic small vessel ischemic disease. 2.   Long term EEG showed recurrent brief  focal motor seizures arising from the left central region. Seizures appeared improved overnight.     Assessment: ZYMIERE TROSTLE is a 61 y.o. male PMHx prior strokes and seizures started 6 months after first stroke, HTN, ETOH, cocaine and tobacco dependence. Witnessed seizure activity while compliant on Keppra 1500mg  PO BID.  Wife says patient has not slept during night due to seizures and muscle jerks and Mag level 1.5 in ED.   Impression:  Provoked seizures by sleep deprivation and/or Mag level 1.5 in patient with seizure disorder.  LTM 2/13-14: Impression: This was an abnormal continuous video EEG due to recurrent brief focal motor seizures arising from the left central region. Seizures appeared improved overnight.   LTM: 2/14-2/15: mpression: This was an abnormal continuous video EEG due to resolved brief focal motor seizures arising from the left central region. No seizures were seen after 1100 on 07/28/21.  Plan: Switch meds to po. - Continue  Keppra 1500mg  BID -con't Valproate acid 500mg  po TID (can transition to Depakote ER 1,500 mg qhs) Con't  Vimpat to 150mg  po BID  - Ativan 2mg  PRN Q4hr for seizures LTM stopped.  Neurology will sign off. He can f/u with his neurologist outpt for further mgt of AEDs.  Discussed plan with him and his sister in detail.   Total of 35 mins spent reviewing chart, discussion with patient and family on prognosis, Dx and plan. Discussed case with patient's nurse. Reviewed Imaging personally.   If 7pm- 7am, please page neurology on call as listed in [place].

## 2021-07-29 NOTE — Evaluation (Signed)
Occupational Therapy Evaluation Patient Details Name: Gerald [name] MRN: [id number] DOB: Sep 11, 1960 Today's Date: 07/29/2021   History of Present Illness Pt. is a 61 y.o. male presenting to [facility] on 2/13 having frequent seizure leading to R UE and face twitching. He has continued to have seizure in the hospital and complains of R UE weakness afterwards. PMH significant of CVA, focal seizures, and polysubstance abuse who presented with complaints of seizures   Clinical Impression   Pt admitted for concerns listed above. PTA pt reported that he was independent with all transfers and ADL's, however when speaking with his sister, she reports that pt needs max assist for all ADL's and +2 assist for transfers from the bed to the [facility]. At this time, pt presents near that baseline, with max A +1-2 assist needed for all ADL's and max A +2 for bed mobility and pivot transfer to recliner from EOB. Pt is densely plegic on R side, unsure of accuracy when pt reports that he has sensation there, due to difficulty with recall, following commands, sequencing, and awareness. PT's family reporting that they want him to return home once medically stable with max HH services. OT will follow acutely.       Recommendations for follow up therapy are one component of a multi-disciplinary discharge planning process, led by the attending physician.  Recommendations may be updated based on patient status, additional functional criteria and insurance authorization.   Follow Up Recommendations  Home health OT    Assistance Recommended at Discharge Frequent or constant Supervision/Assistance  Patient can return home with the following Two people to help with walking and/or transfers;Two people to help with bathing/dressing/bathroom;Assistance with cooking/housework;Assistance with feeding;Direct supervision/assist for medications management;Direct supervision/assist for financial management;Help with stairs or ramp for  entrance;Assist for transportation    Functional Status Assessment  Patient has had a recent decline in their functional status and demonstrates the ability to make significant improvements in function in a reasonable and predictable amount of time.  Equipment Recommendations  Hospital bed;Other (comment) Optician, dispensing)    Recommendations for Other Services       Precautions / Restrictions Precautions Precautions: Fall Restrictions Weight Bearing Restrictions: No      Mobility Bed Mobility Overal bed mobility: Needs Assistance Bed Mobility: Rolling, Sidelying to Sit Rolling: Max assist Sidelying to sit: Max assist, HOB elevated       General bed mobility comments: Would benefit from +2, ptunable to assist much with coming to sit up due to dense plegia in RUE    Transfers Overall transfer level: Needs assistance Equipment used: 2 person hand held assist Transfers: Bed to chair/wheelchair/BSC     Squat pivot transfers: Max assist, +2 physical assistance, +2 safety/equipment       General transfer comment: Pt providing some assist with standing in LLE, however unable to move either leg with pivot to chair resulting in close to total A +2 for pivot transfer to chair.      Balance Overall balance assessment: Needs assistance Sitting-balance support: Single extremity supported Sitting balance-Leahy Scale: Fair Sitting balance - Comments: Pt able to right himself mostly, with posterior leans noted throughout session Postural control: Posterior lean Standing balance support: Bilateral upper extremity supported Standing balance-Leahy Scale: Zero Standing balance comment: Pt requiring +2 assist to stand, unable to maintain upright posture without support                           ADL  either performed or assessed with clinical judgement   ADL Overall ADL's : Needs assistance/impaired                                       General ADL Comments:  Pt continues to require max A +1-2 for all ADL's due to weakness, difficulty sequencing, and balance deficits.     Vision Baseline Vision/History: 1 Wears glasses Ability to See in Adequate Light: 1 Impaired Patient Visual Report: No change from baseline Vision Assessment?: No apparent visual deficits Additional Comments: Pt has visual deficits from prior strokes, no change per pt at this time,     Perception     Praxis      Pertinent Vitals/Pain Pain Assessment Pain Assessment: No/denies pain     Hand Dominance Right   Extremity/Trunk Assessment Upper Extremity Assessment Upper Extremity Assessment: Defer to OT evaluation RUE Deficits / Details: Plegic from prior stroke, unsure of sensation as pt reports "yes" to all questions asked, no withdraw to pinch. RUE: Shoulder pain with ROM RUE Sensation: decreased light touch RUE Coordination: decreased fine motor;decreased gross motor   Lower Extremity Assessment Lower Extremity Assessment: RLE deficits/detail RLE Deficits / Details: Extensor tone, clonus, unable to assess strength RLE Sensation: decreased light touch;decreased proprioception RLE Coordination: decreased fine motor;decreased gross motor   Cervical / Trunk Assessment Cervical / Trunk Assessment: Kyphotic   Communication Communication Communication: Expressive difficulties   Cognition Arousal/Alertness: Awake/alert Behavior During Therapy: Flat affect Overall Cognitive Status: History of cognitive impairments - at baseline                                 General Comments: Pt's sister reports that he has a history of memory deficits, decreased awareness, inattention, and difficulty following multistep commands.     General Comments  VSS on RA    Exercises     Shoulder Instructions      Home Living Family/patient expects to be discharged to:: Private residence Living Arrangements: Spouse/significant other Available Help at Discharge:  Family;Available 24 hours/day Type of Home: Apartment Home Access: Stairs to enter Entrance Stairs-Number of Steps: 1 Entrance Stairs-Rails: None Home Layout: One level     Bathroom Shower/Tub: Teacher, early years/pre: Handicapped height     Home Equipment: Conservation officer, nature (2 wheels);BSC/3in1;Shower seat;Wheelchair - manual          Prior Functioning/Environment Prior Level of Function : Needs assist             Mobility Comments: Needs +2 assist to get from bed to [facility] ADLs Comments: Pt reports independence, Sister reports that pt requiring max assist on all ADL's        OT Problem List: Decreased strength;Decreased range of motion;Decreased activity tolerance;Impaired balance (sitting and/or standing);Impaired vision/perception;Decreased coordination;Decreased cognition;Decreased safety awareness;Decreased knowledge of use of DME or AE;Impaired sensation;Impaired UE functional use      OT Treatment/Interventions: Self-care/ADL training;Therapeutic exercise;Energy conservation;DME and/or AE instruction;Therapeutic activities;Patient/family education;Balance training;Cognitive remediation/compensation    OT Goals(Current goals can be found in the care plan section) Acute Rehab OT Goals Patient Stated Goal: To go home OT Goal Formulation: With patient Time For Goal Achievement: 08/12/21 Potential to Achieve Goals: Fair  OT Frequency: Min 2X/week    Co-evaluation              AM-PAC OT "  6 Clicks" Daily Activity     Outcome Measure Help from another person eating meals?: A Lot Help from another person taking care of personal grooming?: A Lot Help from another person toileting, which includes using toliet, bedpan, or urinal?: A Lot Help from another person bathing (including washing, rinsing, drying)?: A Lot Help from another person to put on and taking off regular upper body clothing?: A Lot Help from another person to put on and taking off regular lower  body clothing?: A Lot 6 Click Score: 12   End of Session Equipment Utilized During Treatment: Gait belt Nurse Communication: Mobility status  Activity Tolerance: Patient tolerated treatment well Patient left: in chair;with call bell/phone within reach;with chair alarm set;with family/visitor present  OT Visit Diagnosis: Unsteadiness on feet (R26.81);Other abnormalities of gait and mobility (R26.89);Muscle weakness (generalized) (M62.81);Other symptoms and signs involving cognitive function;Hemiplegia and hemiparesis Hemiplegia - Right/Left: Right Hemiplegia - dominant/non-dominant: Dominant Hemiplegia - caused by: Cerebral infarction                Time: 1354-1425 OT Time Calculation (min): 31 min Charges:  OT General Charges $OT Visit: 1 Visit OT Evaluation $OT Eval Moderate Complexity: 1 Mod OT Treatments $Therapeutic Activity: 8-22 mins  Akisha Sturgill H., OTR/L Acute Rehabilitation  Akua Blethen Elane Marra Fraga 07/29/2021, 4:13 PM

## 2021-07-29 NOTE — Plan of Care (Signed)
Pt is alert oriented x 3. Pts wife is present at the bedside. Pt pulled 2 Ivs out. Mittens placed. IV consult placed. Pt has received VI anti seizure medications. Pt has ben turned and repositioned. Pt is able to reposition self in bed. Male pure wick replaced. Oral care completed. Seizure precautions in place.   Problem: Education: Goal: Knowledge of General Education information will improve Description: Including pain rating scale, medication(s)/side effects and non-pharmacologic comfort measures Outcome: Progressing   Problem: Health Behavior/Discharge Planning: Goal: Ability to manage health-related needs will improve Outcome: Progressing   Problem: Clinical Measurements: Goal: Ability to maintain clinical measurements within normal limits will improve Outcome: Progressing Goal: Will remain free from infection Outcome: Progressing Goal: Diagnostic test results will improve Outcome: Progressing Goal: Respiratory complications will improve Outcome: Progressing Goal: Cardiovascular complication will be avoided Outcome: Progressing   Problem: Activity: Goal: Risk for activity intolerance will decrease Outcome: Progressing   Problem: Nutrition: Goal: Adequate nutrition will be maintained Outcome: Progressing   Problem: Coping: Goal: Level of anxiety will decrease Outcome: Progressing   Problem: Elimination: Goal: Will not experience complications related to bowel motility Outcome: Progressing Goal: Will not experience complications related to urinary retention Outcome: Progressing   Problem: Pain Managment: Goal: General experience of comfort will improve Outcome: Progressing   Problem: Safety: Goal: Ability to remain free from injury will improve Outcome: Progressing   Problem: Skin Integrity: Goal: Risk for impaired skin integrity will decrease Outcome: Progressing

## 2021-07-29 NOTE — Progress Notes (Signed)
Initial Nutrition Assessment  DOCUMENTATION CODES:   Obesity unspecified  INTERVENTION:  - Encourage PO intake - Ensure Enlive po BID, each supplement provides 350 kcal and 20 grams of protein. - MVI with minerals daily  NUTRITION DIAGNOSIS:   Increased nutrient needs related to acute illness as evidenced by estimated needs.  GOAL:   Patient will meet greater than or equal to 90% of their needs  MONITOR:   PO intake, Supplement acceptance, Diet advancement, Labs, Weight trends  REASON FOR ASSESSMENT:   Consult Assessment of nutrition requirement/status, Enteral/tube feeding initiation and management  ASSESSMENT:   Pt admitted with status epilepticus. PMH includes CVA, focal seizures and polysubstance abuse.  RD consulted for initiation of tube feeding, however pt awake in bed. SLP following and placed pt on dysphagia 3 diet. Pt awake with eyes closed during most of visit. He reports feeling ready to eat and a good appetite PTA. He was eating 3 meals per day consisting of eggs, sausage, fried chicken, steak, and various other vegetables and food items. His beverages include mostly water, sweet tea, Natural Light beer during sporting events, and sodas. RD went over menu options and assisted pt with ordering lunch- meatloaf, mashed potatoes, broccoli, strawberry ice cream and sweet tea. Will order Ensure to optimize nutritional intake.  Pt reports a usual weight of ~260 lbs, although his wife states he weighs less than that. He has not weighed himself recently but endorses that his clothes fit more loosely. Limited documentation of recent weight history to confirm weight changes.  Medications reviewed  Labs: sodium 134, potassium 5.3 (H), BUN 5  NUTRITION - FOCUSED PHYSICAL EXAM:  Flowsheet Row Most Recent Value  Orbital Region No depletion  Upper Arm Region No depletion  Thoracic and Lumbar Region No depletion  Buccal Region No depletion  Temple Region No depletion   Clavicle Bone Region No depletion  Clavicle and Acromion Bone Region No depletion  Scapular Bone Region No depletion  Dorsal Hand No depletion  Patellar Region No depletion  Anterior Thigh Region No depletion  Posterior Calf Region No depletion  Edema (RD Assessment) None  Hair Reviewed  Eyes Reviewed  Mouth Reviewed  Skin Reviewed  Nails Reviewed       Diet Order:   Diet Order             DIET DYS 3 Room service appropriate? Yes with Assist; Fluid consistency: Thin  Diet effective now                   EDUCATION NEEDS:   Education needs have been addressed  Skin:  Skin Assessment: Reviewed RN Assessment  Last BM:  PTA  Height:   Ht Readings from Last 1 Encounters:  07/27/21 6' (1.829 m)    Weight:   Wt Readings from Last 1 Encounters:  07/27/21 109.8 kg    Ideal Body Weight:  80.9 kg  BMI:  Body mass index is 32.83 kg/m.  Estimated Nutritional Needs:   Kcal:  2200-2400  Protein:  110-125g  Fluid:  >/=2.2L  Clayborne Dana, RDN, LDN Clinical Nutrition

## 2021-07-29 NOTE — Progress Notes (Signed)
Discontinued cEEG study.  No skin breakdown observed.

## 2021-07-29 NOTE — Progress Notes (Signed)
PT Cancellation Note  Patient Details Name: Gerald [name] MRN: [id number] DOB: 12/09/1960   Cancelled Treatment:    Reason Eval/Treat Not Completed: Medical issues which prohibited therapy- orders to remove EEG today. Will check back this afternoon to wait for improved mobility clearance.   Thermon Leyland, SPT Acute Rehab Services    Thermon Leyland 07/29/2021, 9:50 AM

## 2021-07-29 NOTE — Procedures (Signed)
EEG Procedure CPT/Type of Study: 02111; 24hr EEG with video Referring Provider: Tamala Julian Primary Neurological Diagnosis: focal motor status  History: This is a 61 yr old patient, undergoing an EEG to evaluate for focal motor seizures. Clinical State: obtunded  Technical Description:  The EEG was performed using standard setting per the guidelines of American Clinical Neurophysiology Society (ACNS).  A minimum of 21 electrodes were placed on scalp according to the International 10-20 or/and 10-10 Systems. Supplemental electrodes were placed as needed. Single EKG electrode was also used to detect cardiac arrhythmia. Patient's behavior was continuously recorded on video simultaneously with EEG. A minimum of 16 channels were used for data display. Each epoch of study was reviewed manually daily and as needed using standard referential and bipolar montages. Computerized quantitative EEG analysis (such as compressed spectral array analysis, trending, automated spike & seizure detection) were used as indicated.   Day 2: from 0730 07/28/21 to 0730 07/29/21  EEG Description: Overall Amplitude:Normal Predominant Frequency: The background activity showed theta, with about 4-5 Hz, that was frequent. Superimposed Frequencies: sparse beta activity bilaterally The background was symmetric  Background Abnormalities: Generalized slowing Rhythmic or periodic pattern: No Epileptiform activity: Yes; left central spikes, resolved Electrographic seizures: Yes; focal subtle clinical seizures with left central onset as the previous day's record but resolved after 1100 on 07/28/21   Events: no   Breach rhythm: no  Reactivity: Present  Stimulation procedures:  Hyperventilation: not done Photic stimulation: not done  Sleep Background: Stage II  EKG:no significant arrhythmia  Impression: This was an abnormal continuous video EEG due to resolved brief focal motor seizures arising from the left central region.  No seizures were seen after 1100 on 07/28/21.

## 2021-07-29 NOTE — Progress Notes (Signed)
Gerald [name]  VQX:[id number] DOB: 1960-10-12 DOA: 07/27/2021 PCP: Charolette Forward, MD    Brief Narrative:  [phone] with a history of CVA, polysubstance abuse, and focal seizures who presented to the ED with seizure activity.  Consultants:  Neurology  Code Status: FULL CODE  Antimicrobials:  none  DVT prophylaxis: Xarelto   Interim Hx: Alert and conversant.  Pleasant.  Voracious appetite.  No new complaints.  Denies shortness of breath or chest pain.  Assessment & Plan:  Status epilepticus EEG confirmed frequent seizure activity arising from left hemisphere -care being directed by neurology -no more seizures noted since yesterday per neurology evaluation  Hypomagnesemia Corrected with supplementation  History of CVA Chronic deficits include right spastic hemiparesis, aphasia, and dysarthria -chronically on Xarelto  HTN Monitor blood pressure without change in therapy today  Family Communication: No family present at time of exam Disposition: From home -possible discharge home 2/16 if remains clinically stable overnight  Objective: Blood pressure (!) 158/97, pulse 76, temperature 98.5 F (36.9 C), temperature source Oral, resp. rate 18, height 6' (1.829 m), weight 109.8 kg, SpO2 95 %.  Intake/Output Summary (Last 24 hours) at 07/29/2021 1056 Last data filed at 07/29/2021 0100 Gross per 24 hour  Intake 227.56 ml  Output 800 ml  Net -572.44 ml   Filed Weights   07/27/21 0835  Weight: 109.8 kg    Examination: General: No acute respiratory distress Lungs: Clear to auscultation bilaterally without wheezes or crackles Cardiovascular: Regular rate and rhythm without murmur gallop or rub normal S1 and S2 Abdomen: Nontender, nondistended, soft, bowel sounds positive, no rebound, no ascites, no appreciable mass Extremities: No significant cyanosis, clubbing, or edema bilateral lower extremities  CBC: Recent Labs  Lab 07/27/21 0330 07/28/21 0502 07/29/21 0346  WBC  9.1 9.2 8.8  NEUTROABS 4.9  --  6.0  HGB 14.6 14.2 13.7  HCT 45.6 43.5 41.1  MCV 98.1 96.2 94.7  PLT 268 226 917   Basic Metabolic Panel: Recent Labs  Lab 07/27/21 0330 07/27/21 1058 07/28/21 0502 07/29/21 0346  NA 135  --  136 134*  K 4.0  --  3.9 5.3*  CL 101  --  101 101  CO2 24  --  25 26  GLUCOSE 101*  --  95 92  BUN 10  --  6 5*  CREATININE 1.11  --  0.82 0.86  CALCIUM 9.1  --  8.8* 8.5*  MG 1.5* 2.4 1.9 2.2   GFR: Estimated Creatinine Clearance: 119.8 mL/min (by C-G formula based on SCr of 0.86 mg/dL).  Liver Function Tests: Recent Labs  Lab 07/27/21 0330  AST 20  ALT 16  ALKPHOS 54  BILITOT 0.7  PROT 7.4  ALBUMIN 3.6   HbA1C: Hemoglobin A1C  Date/Time Value Ref Range Status  08/08/2015 10:14 AM 5 3  Final   Hgb A1c MFr Bld  Date/Time Value Ref Range Status  07/28/2021 05:02 AM 5.1 4.8 - 5.6 % Final    Comment:    (NOTE) Pre diabetes:          5.7%-6.4%  Diabetes:              >6.4%  Glycemic control for   <7.0% adults with diabetes   07/03/2015 03:12 AM 5.3 4.8 - 5.6 % Final    Comment:    (NOTE)         Pre-diabetes: 5.7 - 6.4         Diabetes: >6.4  Glycemic control for adults with diabetes: <7.0      Scheduled Meds:  amLODipine  5 mg Oral Daily   atorvastatin  20 mg Oral Daily   [START ON 07/30/2021] COVID-19 mRNA Vac-TriS ([employer])  0.3 mL Intramuscular Once   lisinopril  40 mg Oral Daily   metoprolol tartrate  2.5 mg Intravenous Q6H   rivaroxaban  20 mg Oral Q supper   sodium chloride flush  3 mL Intravenous Q12H   Continuous Infusions:  sodium chloride 75 mL/hr at 07/28/21 2324   lacosamide (VIMPAT) IV 150 mg (07/29/21 1033)   levETIRAcetam 1,500 mg (07/29/21 0914)   valproate sodium 500 mg (07/29/21 0511)     LOS: 1 day   Cherene Altes, MD [facility] Office  [phone] Pager - Text Page per Shea Evans  If 7PM-7AM, please contact night-coverage per Amion 07/29/2021, 10:56 AM

## 2021-07-29 NOTE — Evaluation (Signed)
Physical Therapy Evaluation Patient Details Name: Gerald [name] MRN: [id number] DOB: 13-Oct-1960 Today's Date: 07/29/2021  History of Present Illness  Pt. is a 61 y.o. male presenting to [facility] on 2/13 having frequent seizure leading to R UE and face twitching. He has continued to have seizure in the hospital and complains of R UE weakness afterwards. PMH significant of CVA, focal seizures, and polysubstance abuse  Clinical Impression  Pt presents with worsened R UE/LE function, sensation, and cognitive processing via multiple seizures. These impairments are limiting his ability to safely transfer, perform all adls/iadls, and assist with daily functional tasks. Pt to benefit from acute PT to address deficits. Pt was able to stand Mod A x2 and perform a stand pivot transfer Max A x2 for sequencing and LE management. Pt. Responded well but reports feeling fatigued after the session. Pt appears to be more SNF appropriate but per OT discussion with family they would like him to return home with home health follow up. PT to progress mobility as tolerated, and will continue to follow acutely.         Recommendations for follow up therapy are one component of a multi-disciplinary discharge planning process, led by the attending physician.  Recommendations may be updated based on patient status, additional functional criteria and insurance authorization.  Follow Up Recommendations Home health PT    Assistance Recommended at Discharge Frequent or constant Supervision/Assistance  Patient can return home with the following  Assistance with feeding;Assist for transportation;Direct supervision/assist for financial management;Assistance with cooking/housework;Two people to help with walking and/or transfers;A lot of help with bathing/dressing/bathroom    Equipment Recommendations    Recommendations for Other Services       Functional Status Assessment Patient has had a recent decline in their functional  status and demonstrates the ability to make significant improvements in function in a reasonable and predictable amount of time.     Precautions / Restrictions Precautions Precautions: Fall Restrictions Weight Bearing Restrictions: No      Mobility  Bed Mobility Overal bed mobility: Needs Assistance Bed Mobility: Sit to Supine, Sit to Sidelying       Sit to supine: Max assist, +2 for physical assistance Sit to sidelying: Max assist, +2 for physical assistance General bed mobility comments: Pt in chair upon PT arrival, requires cues for sequencing, management of trunk, B LEs, and R UE    Transfers Overall transfer level: Needs assistance Equipment used: 2 person hand held assist Transfers: Bed to chair/wheelchair/BSC, Sit to/from Stand Sit to Stand: Mod assist, +2 physical assistance Stand pivot transfers: Max assist, +2 physical assistance         General transfer comment: Mod A fro STS, max cues, ability to perform standing balance, unable to distinguish R<>L side of body, unable to perform much pregait. Max A for faciliatation of LE.    Ambulation/Gait                  Stairs            Wheelchair Mobility    Modified Rankin (Stroke Patients Only)       Balance Overall balance assessment: Needs assistance Sitting-balance support: Feet supported, Single extremity supported Sitting balance-Leahy Scale: Poor Sitting balance - Comments: Pt. able to reach minimally out of BOS and return but demostrates significant R sided lean Postural control: Right lateral lean Standing balance support: Bilateral upper extremity supported, Reliant on assistive device for balance Standing balance-Leahy Scale: Zero Standing balance comment: Pt. mod A  for stand, able to self steady if R LE is supported and he has hand held assist with L UE                             Pertinent Vitals/Pain Pain Assessment Pain Assessment: No/denies pain    Home Living  Family/patient expects to be discharged to:: Private residence Living Arrangements: Spouse/significant other Available Help at Discharge: Family;Available 24 hours/day Type of Home: Apartment Home Access: Stairs to enter Entrance Stairs-Rails: None Entrance Stairs-Number of Steps: 1   Home Layout: One level Home Equipment: Conservation officer, nature (2 wheels);BSC/3in1;Shower seat;Wheelchair - manual      Prior Function Prior Level of Function : Needs assist             Mobility Comments: Needs +2 assist to get from bed to [facility] ADLs Comments: Pt reports independence, Sister reports that pt requiring max assist on all ADL's     Hand Dominance   Dominant Hand: Right    Extremity/Trunk Assessment   Upper Extremity Assessment Upper Extremity Assessment: Defer to OT evaluation RUE Deficits / Details: Plegic from prior stroke, unsure of sensation as pt reports "yes" to all questions asked, no withdraw to pinch. RUE: Shoulder pain with ROM RUE Sensation: decreased light touch RUE Coordination: decreased fine motor;decreased gross motor    Lower Extremity Assessment Lower Extremity Assessment: RLE deficits/detail RLE Deficits / Details: unable to formally assess strength due to extensor tone, clonus RLE Sensation: decreased light touch;decreased proprioception RLE Coordination: decreased fine motor;decreased gross motor    Cervical / Trunk Assessment Cervical / Trunk Assessment: Kyphotic  Communication   Communication: Expressive difficulties  Cognition Arousal/Alertness: Awake/alert Behavior During Therapy: WFL for tasks assessed/performed Overall Cognitive Status: History of cognitive impairments - at baseline                                 General Comments: Per nurse communication with sister this is similar to his basline        General Comments General comments (skin integrity, edema, etc.): VSS on RA    Exercises Other Exercises Other Exercises: Seated  trunk curl upsx3, pt. able to pull trunk to sitting upright from leaning back in chair using L UE   Assessment/Plan    PT Assessment Patient needs continued PT services  PT Problem List Decreased strength;Decreased mobility;Decreased safety awareness;Impaired tone;Decreased coordination;Decreased range of motion;Decreased knowledge of precautions;Decreased activity tolerance;Decreased cognition;Decreased balance;Impaired sensation       PT Treatment Interventions DME instruction;Therapeutic exercise;Balance training;Gait training;Neuromuscular re-education;Functional mobility training;Cognitive remediation;Therapeutic activities;Patient/family education    PT Goals (Current goals can be found in the Care Plan section)  Acute Rehab PT Goals Patient Stated Goal: To return home PT Goal Formulation: With patient Time For Goal Achievement: 08/12/21 Potential to Achieve Goals: Fair    Frequency Min 3X/week     Co-evaluation               AM-PAC PT "6 Clicks" Mobility  Outcome Measure Help needed turning from your back to your side while in a flat bed without using bedrails?: A Lot Help needed moving from lying on your back to sitting on the side of a flat bed without using bedrails?: A Lot Help needed moving to and from a bed to a chair (including a wheelchair)?: Total Help needed standing up from a chair using your arms (e.g., wheelchair  or bedside chair)?: Total Help needed to walk in hospital room?: Total Help needed climbing 3-5 steps with a railing? : Total 6 Click Score: 8    End of Session   Activity Tolerance: Patient tolerated treatment well Patient left: in bed;with call bell/phone within reach;with bed alarm set Nurse Communication: Mobility status;Other (comment) (discharge reccomendations) PT Visit Diagnosis: Muscle weakness (generalized) (M62.81);Difficulty in walking, not elsewhere classified (R26.2);Hemiplegia and hemiparesis Hemiplegia - Right/Left:  Right Hemiplegia - dominant/non-dominant: Dominant Hemiplegia - caused by: Unspecified    Time: 6295-2841 PT Time Calculation (min) (ACUTE ONLY): 25 min   Charges:   PT Evaluation $PT Eval Low Complexity: 1 Low PT Treatments $Therapeutic Activity: 8-22 mins        Thermon Leyland, SPT Acute Rehab Services   Thermon Leyland 07/29/2021, 4:27 PM

## 2021-07-30 ENCOUNTER — Inpatient Hospital Stay (HOSPITAL_COMMUNITY): Payer: PPO

## 2021-07-30 DIAGNOSIS — M7989 Other specified soft tissue disorders: Secondary | ICD-10-CM

## 2021-07-30 LAB — DRUG PROFILE, UR, 9 DRUGS (LABCORP)
Amphetamines, Urine: NEGATIVE ng/mL
Barbiturate, Ur: NEGATIVE ng/mL
Benzodiazepine Quant, Ur: NEGATIVE ng/mL
Cannabinoid Quant, Ur: NEGATIVE ng/mL
Cocaine (Metab.): NEGATIVE ng/mL
Methadone Screen, Urine: NEGATIVE ng/mL
Opiate Quant, Ur: NEGATIVE ng/mL
Phencyclidine, Ur: NEGATIVE ng/mL
Propoxyphene, Urine: NEGATIVE ng/mL

## 2021-07-30 LAB — BASIC METABOLIC PANEL
Anion gap: 11 (ref 5–15)
BUN: <5 mg/dL — ABNORMAL LOW (ref 6–20)
CO2: 22 mmol/L (ref 22–32)
Calcium: 9.1 mg/dL (ref 8.9–10.3)
Chloride: 106 mmol/L (ref 98–111)
Creatinine, Ser: 0.78 mg/dL (ref 0.61–1.24)
GFR, Estimated: >60 mL/min (ref >60)
Glucose, Bld: 91 mg/dL (ref 70–99)
Potassium: 3.7 mmol/L (ref 3.5–5.1)
Sodium: 139 mmol/L (ref 135–145)

## 2021-07-30 LAB — MAGNESIUM: Magnesium: 1.8 mg/dL (ref 1.7–2.4)

## 2021-07-30 MED ORDER — DIVALPROEX SODIUM ER 500 MG PO TB24: 1500.0000 mg | ORAL_TABLET | Freq: Every day | ORAL | 2 refills | Status: DC

## 2021-07-30 MED ORDER — LACOSAMIDE 150 MG PO TABS: 150.0000 mg | ORAL_TABLET | Freq: Two times a day (BID) | ORAL | 2 refills | Status: DC

## 2021-07-30 MED ORDER — LEVETIRACETAM 750 MG PO TABS: 1500.0000 mg | ORAL_TABLET | Freq: Two times a day (BID) | ORAL | 2 refills | Status: DC

## 2021-07-30 NOTE — Progress Notes (Signed)
Length of Need 6 Months  The above medical condition requires: Patient requires the ability to reposition frequently  Head must be elevated greater than: 30 degrees  Bed type Semi-electric  Hoyer Lift Yes  Support Surface: Gel Overlay   

## 2021-07-30 NOTE — Plan of Care (Signed)
Pt has done well today. Right arm is swollen. Doppler done on RA.   Problem: Education: Goal: Knowledge of General Education information will improve Description: Including pain rating scale, medication(s)/side effects and non-pharmacologic comfort measures Outcome: Completed/Met   Problem: Health Behavior/Discharge Planning: Goal: Ability to manage health-related needs will improve Outcome: Completed/Met   Problem: Clinical Measurements: Goal: Ability to maintain clinical measurements within normal limits will improve Outcome: Completed/Met Goal: Will remain free from infection Outcome: Completed/Met Goal: Diagnostic test results will improve Outcome: Completed/Met Goal: Respiratory complications will improve Outcome: Completed/Met Goal: Cardiovascular complication will be avoided Outcome: Completed/Met   Problem: Activity: Goal: Risk for activity intolerance will decrease Outcome: Completed/Met   Problem: Nutrition: Goal: Adequate nutrition will be maintained Outcome: Completed/Met   Problem: Coping: Goal: Level of anxiety will decrease Outcome: Completed/Met   Problem: Elimination: Goal: Will not experience complications related to bowel motility Outcome: Completed/Met Goal: Will not experience complications related to urinary retention Outcome: Completed/Met   Problem: Pain Managment: Goal: General experience of comfort will improve Outcome: Completed/Met   Problem: Safety: Goal: Ability to remain free from injury will improve Outcome: Completed/Met   Problem: Skin Integrity: Goal: Risk for impaired skin integrity will decrease Outcome: Completed/Met

## 2021-07-30 NOTE — Progress Notes (Signed)
Pt has been discharged to home. Packet and education given to pt and wife. No questions or concerns. Med script given and where to pick up the other 2 medications. Pt safely put into car.

## 2021-07-30 NOTE — Progress Notes (Signed)
Upper extremity venous has been completed.   Preliminary results in CV Proc.   Gerald [name] Gerald [name] 07/30/2021 11:28 AM

## 2021-07-30 NOTE — TOC Transition Note (Signed)
Transition of Care [facility]) - CM/SW Discharge Note   Patient Details  Name: Gerald [name] MRN: [id number] Date of Birth: 10-29-1960  Transition of Care [facility] CM/SW Contact:  Pollie Friar, RN Phone Number: 07/30/2021, 1:27 PM   Clinical Narrative:    Pt has shower seat, wheelchair, walker, 3 in 1 at home. Wife is responsible for his home medications. She or one of his sisters provide needed transportation. Patient is discharging home with home health services through [place]. Information on the AVS.  Wife has decided she would like a hospital bed and hoyer lift for home. CM has ordered the DME through [employer] and it will be delivered to the home. The spouse states she doesn't need the bed at the home for discharge and a tomorrow delivery will be good.  Wife providing transportation home.    Final next level of care: Home w Home Health Services Barriers to Discharge: No Barriers Identified   Patient Goals and CMS Choice   CMS Medicare.gov Compare Post Acute Care list provided to:: Patient Represenative (must comment) Choice offered to / list presented to : Spouse  Discharge Placement                       Discharge Plan and Services                DME Arranged: Hospital bed (with hoyer lift) DME Agency: [employer] Date DME Agency Contacted: 07/30/21   Representative spoke with at DME Agency: Freda Munro HH Arranged: PT, OT [facility] Agency: [place] Date [place]: 07/30/21   Representative spoke with at [place]: Olivia ([place]) Interventions     Readmission Risk Interventions No flowsheet data found.

## 2021-07-30 NOTE — Discharge Summary (Signed)
DISCHARGE SUMMARY  RICHMOND COLDREN  MR#: [id number]  DOB:02/14/1961  Date of Admission: 07/27/2021 Date of Discharge: 07/30/2021  Attending Physician:Chrishon Martino Hennie Duos, MD  Patient's SLH:[id number], Prudencio Burly, MD  Consults: Neurology  Disposition: D/C home   Follow-up Appts:  Follow-up Information     Charolette Forward, MD Follow up in 1 week(s).   Specialty: Cardiology Contact information: [place] [address] [name] [address] [phone]         Garvin Fila, MD. Schedule an appointment as soon as possible for a visit in 3 week(s).   Specialties: Neurology, Radiology Contact information: [address] [place] [address] [phone]                 Tests Needing Follow-up: -follow up magnesium level - determine if supplementation w/ daily Mg is indicated  -f/u BP control - aldactone stopped during this admit    Discharge Diagnoses: Status epilepticus Hypomagnesemia History of CVA HTN  Initial presentation: 61yo with a history of CVA, polysubstance abuse, and focal seizures who presented to the ED with seizure activity.  Hospital Course:  Status epilepticus EEG confirmed frequent seizure activity arising from left hemisphere - felt to have been provoked by hypomagnesemia and sleep deprivation - pt denied noncompliance with home medication regimen - care directed by neurology - continuous monitoring confirmed resolution of seizures - pt improved rapidly and was clinically back to his baseline at time of d/c - cont adjusted med doses as detailed below - further titration of AEDs to be considered by his Neurologist in outpt f/u    Hypomagnesemia Corrected with supplementation and stable at time of d/c    History of CVA Chronic deficits include right spastic hemiparesis, aphasia, and dysarthria - chronically on Xarelto  Swelling of R arm Pt noted significant swelling of R arm on day of d/c - pt had clear blood draws in R  antecubital fossa - venous duplex obtained and ruled out DVT - pt instructed to elevate arm and edema should resolve over next 2-3 days - warned to monitor for erythema or calor and seek medical assistance if this occurs    HTN Stopped diuretic w/ hx of intermittent poor oral intake - BP reasonably well controlled at time of d/c  Allergies as of 07/30/2021   No Known Allergies      Medication List     STOP taking these medications    baclofen 20 MG tablet Commonly known as: LIORESAL   sertraline 50 MG tablet Commonly known as: ZOLOFT   spironolactone 50 MG tablet Commonly known as: ALDACTONE       TAKE these medications    acetaminophen 500 MG tablet Commonly known as: TYLENOL Take 500 mg by mouth every 6 (six) hours as needed (pain).   amLODipine 5 MG tablet Commonly known as: NORVASC Take 5 mg by mouth daily.   atorvastatin 20 MG tablet Commonly known as: LIPITOR Take 1 tablet (20 mg total) by mouth daily at 6 PM. What changed: when to take this   divalproex 500 MG 24 hr tablet Commonly known as: DEPAKOTE ER Take 3 tablets (1,500 mg total) by mouth at bedtime.   Lacosamide 150 MG Tabs Take 1 tablet (150 mg total) by mouth 2 (two) times daily.   levETIRAcetam 750 MG tablet Commonly known as: KEPPRA Take 2 tablets (1,500 mg total) by mouth 2 (two) times daily. What changed: how much to take   lisinopril 40 MG tablet Commonly known  as: ZESTRIL Take 1 tablet (40 mg total) by mouth daily.   metoprolol tartrate 25 MG tablet Commonly known as: LOPRESSOR Take 1 tablet (25 mg total) by mouth 2 (two) times daily.   rivaroxaban 20 MG Tabs tablet Commonly known as: XARELTO Take 20 mg by mouth daily with supper.        Day of Discharge BP (!) 145/84 (BP Location: Right Arm)    Pulse 86    Temp 98 F (36.7 C)    Resp 18    Ht 6' (1.829 m)    Wt 109.8 kg    SpO2 99%    BMI 32.83 kg/m   Physical Exam: General: No acute respiratory distress Lungs: Clear to  auscultation bilaterally without wheezes or crackles Cardiovascular: Regular rate and rhythm without murmur gallop or rub normal S1 and S2 Abdomen: Nontender, nondistended, soft, bowel sounds positive, no rebound, no ascites, no appreciable mass Extremities: No significant cyanosis, clubbing, or edema bilateral lower extremities - benign 1+ edema of RUE w/ 2+ radial pulse and no erythema or calor   Basic Metabolic Panel: Recent Labs  Lab 07/27/21 0330 07/27/21 1058 07/28/21 0502 07/29/21 0346 07/30/21 0253  NA 135  --  136 134* 139  K 4.0  --  3.9 5.3* 3.7  CL 101  --  101 101 106  CO2 24  --  25 26 22   GLUCOSE 101*  --  95 92 91  BUN 10  --  6 5* <5*  CREATININE 1.11  --  0.82 0.86 0.78  CALCIUM 9.1  --  8.8* 8.5* 9.1  MG 1.5* 2.4 1.9 2.2 1.8    Liver Function Tests: Recent Labs  Lab 07/27/21 0330  AST 20  ALT 16  ALKPHOS 54  BILITOT 0.7  PROT 7.4  ALBUMIN 3.6   CBC: Recent Labs  Lab 07/27/21 0330 07/28/21 0502 07/29/21 0346  WBC 9.1 9.2 8.8  NEUTROABS 4.9  --  6.0  HGB 14.6 14.2 13.7  HCT 45.6 43.5 41.1  MCV 98.1 96.2 94.7  PLT 268 226 262    Time spent in discharge (includes decision making & examination of pt): 35 minutes  07/30/2021, 12:28 PM   Cherene Altes, MD [facility] Office  [phone]

## 2021-07-30 NOTE — Plan of Care (Signed)
Pt is alert oriented x 2. Pt denies pain. No distress noted. Pt restless, pulling at IV, pulling at external catheter. Pt trying to get out of bed. Pt readjusted. Pt irritable at times.   Problem: Education: Goal: Knowledge of General Education information will improve Description: Including pain rating scale, medication(s)/side effects and non-pharmacologic comfort measures Outcome: Progressing   Problem: Health Behavior/Discharge Planning: Goal: Ability to manage health-related needs will improve Outcome: Progressing   Problem: Clinical Measurements: Goal: Ability to maintain clinical measurements within normal limits will improve Outcome: Progressing Goal: Will remain free from infection Outcome: Progressing Goal: Diagnostic test results will improve Outcome: Progressing Goal: Respiratory complications will improve Outcome: Progressing Goal: Cardiovascular complication will be avoided Outcome: Progressing   Problem: Activity: Goal: Risk for activity intolerance will decrease Outcome: Progressing   Problem: Nutrition: Goal: Adequate nutrition will be maintained Outcome: Progressing   Problem: Coping: Goal: Level of anxiety will decrease Outcome: Progressing   Problem: Elimination: Goal: Will not experience complications related to bowel motility Outcome: Progressing Goal: Will not experience complications related to urinary retention Outcome: Progressing   Problem: Pain Managment: Goal: General experience of comfort will improve Outcome: Progressing   Problem: Safety: Goal: Ability to remain free from injury will improve Outcome: Progressing   Problem: Skin Integrity: Goal: Risk for impaired skin integrity will decrease Outcome: Progressing

## 2021-08-20 DIAGNOSIS — R569 Unspecified convulsions: Secondary | ICD-10-CM | POA: Diagnosis not present

## 2021-08-20 DIAGNOSIS — G40901 Epilepsy, unspecified, not intractable, with status epilepticus: Secondary | ICD-10-CM | POA: Diagnosis not present

## 2021-09-20 DIAGNOSIS — R569 Unspecified convulsions: Secondary | ICD-10-CM | POA: Diagnosis not present

## 2021-09-20 DIAGNOSIS — G40901 Epilepsy, unspecified, not intractable, with status epilepticus: Secondary | ICD-10-CM | POA: Diagnosis not present

## 2021-09-22 ENCOUNTER — Telehealth: Payer: Self-pay | Admitting: Adult Health

## 2021-09-22 ENCOUNTER — Encounter: Payer: Self-pay | Admitting: Adult Health

## 2021-09-22 ENCOUNTER — Ambulatory Visit: Payer: PPO | Admitting: Adult Health

## 2021-09-22 VITALS — BP 152/82 | HR 62 | Ht 72.0 in

## 2021-09-22 DIAGNOSIS — R569 Unspecified convulsions: Secondary | ICD-10-CM

## 2021-09-22 DIAGNOSIS — I6329 Cerebral infarction due to unspecified occlusion or stenosis of other precerebral arteries: Secondary | ICD-10-CM | POA: Diagnosis not present

## 2021-09-22 DIAGNOSIS — Z5181 Encounter for therapeutic drug level monitoring: Secondary | ICD-10-CM

## 2021-09-22 DIAGNOSIS — G8111 Spastic hemiplegia affecting right dominant side: Secondary | ICD-10-CM

## 2021-09-22 MED ORDER — LACOSAMIDE 150 MG PO TABS: 150.0000 mg | ORAL_TABLET | Freq: Two times a day (BID) | ORAL | 0 refills | Status: DC

## 2021-09-22 MED ORDER — DIVALPROEX SODIUM ER 500 MG PO TB24: 1500.0000 mg | ORAL_TABLET | Freq: Every day | ORAL | 3 refills | Status: DC

## 2021-09-22 MED ORDER — LEVETIRACETAM 750 MG PO TABS: 1500.0000 mg | ORAL_TABLET | Freq: Two times a day (BID) | ORAL | 3 refills | Status: DC

## 2021-09-22 NOTE — Progress Notes (Signed)
? ?[facility] ? ?PATIENT: Gerald [name] ?DOB: December 31, 1960 ? ?Reason for visit: Stroke and seizure follow-up ? ? ?Chief Complaint  ?Patient presents with  ? Follow-up  ?  Rm 2 with spouse Gerald [name] ?Pt is well, had a sz in Feb, none since. Spouse also states he is loosing some mobility.   ? ? ? ? ?HISTORY OF PRESENT ILLNESS: ? ?Update 09/22/2021 JM: Patient returns for stroke and seizure follow-up after prior visit 8 months ago accompanied by his wife.   ? ?Unfortunately, recent hospitalization in 07/2021 for seizure activity and status epilepticus despite Keppra compliance, felt to be provoked in setting of sleep deprivation and hypomagnesemia but did require adding Vimpat and Depakote due to recurrent seizures and EEG showing very frequent electrographic seizures arising from left hemisphere. LTM d/c'd 2/15 as no additional seizures since 2/14. CTH negative for acute findings.  Hypomagnesemia resolved after supplementation.  Baclofen and sertraline discontinued.  Clinically returned back to baseline at time of discharge.  He was discharged on Keppra 1500 mg twice daily (home dose), Depakote 1500 mg daily and lacosamide 150 mg BID.  Discharged home on 2/16. ? ?Since discharge, he has not had any additional seizure activity.  He has been compliant on Keppra, Depakote and lacosamide at above dosages, denies side effects. ? ?Does report some increased right-sided spasticity since his seizure.  He is not routinely doing any exercises at home.  He remains nonambulatory.  He was seen by Dr. Letta Pate and received Botox injection on 11/4, and he "no showed" f/u visit on 1/5, no additional visits scheduled.  Compliant on Xarelto and atorvastatin, denies side effects.  Blood pressure today 125/82. Has since had f/u with PCP since discharge with plans on follow-up visit next month.  No further concerns at this time. ? ? ? ? ? ? ?History provided for reference purposes only ?Update 01/12/2021 JM: Mr. Gerald [name]  returns for 6 month stroke follow up accompanied by his wife. Residual rights spastic hemiparesis and aphasia.  Baclofen dosage increased at prior visit for spasticity with some improvement but no significant benefit.  Discussed contacting PMR at prior visit to restart Botox but they have not yet called.  He has since completed therapies which he reports was beneficial.  He remains nonambulatory but is able to stand/pivot for transfers.  Remains on Keppra 1500 mg twice daily without any seizure activity.  Reports compliance on Xarelto and atorvastatin without associated side effects.  Blood pressure today 125/82.  No further concerns at this time. ? ?Update 07/15/2020 JM: Gerald [name] returns for 52-monthstroke and seizure follow-up accompanied by his wife.  Wife is concerned regarding daily occurrence of right upper and lower extremity spasticity typically occurring while laying flat or when attempting to stand.  She was initially concerned regarding possible seizures but after further discussion, these type of spasms are different from when he was having seizures as he does not have any confusion or altered awareness.  She does not believe he has had any seizures "in a while" but unable to state exactly when his last seizure event occurred. At prior visit, he was accompanied by his sister who reported seizure occurrence at least once monthly therefore increase Keppra dosage to 1500 mg twice daily which she has continued on tolerating without side effects.  He does admit to EtOH use approximately 3-4 beers per day but denies substance abuse.  Stable from stroke standpoint with residual right spastic hemiparesis and dysarthria.  they are interested in  additional therapy at this time.  He has remained on baclofen 03/24/19 for spasticity tolerating well with increased dosage at prior visit due to concerns of continued spasticity without great benefit.  Remains nonambulatory but is able to stand/pivot to transfer from bed  to wheelchair.  No further concerns at this time. ? ?Update 01/31/2020 JM: Gerald [name] returns for stroke follow-up accompanied by his sister.  He is usually accompanied by his wife but unfortunately, per sister, she is currently hospitalized with recent stroke. ?Stable since prior visit with residual right spastic hemiparesis and dysarthria ?Sedentary lifestyle with limited activity or exercise.  He is primarily nonambulatory and transfers via wheelchair ?Sisters questioning home health therapy ?Previously receiving Botox injections by Dr. Letta Pate but unfortunately missed prior visit due to transportation issues.  Continues on baclofen 10 mg 3 times daily tolerating well.  Does report worsening spasticity upon awakening in the morning. ?Denies new stroke/TIA symptoms ?Remains on Xarelto and atorvastatin for secondary stroke prevention without side effects ?Blood pressure today 120/80 ?HTN and HLD monitored by PCP ?Remains on Keppra 1000 mg twice daily for seizure prophylaxis.  Sister does report seizure recurrence at least 1 time monthly consisting of extremity tremors and drooling and eventually will fall asleep.  He apparently is responsive throughout episode.  She is unable to state length of seizures as these are typically witnessed by wife.  Denies missing Keppra dosage.  Unable to state if seizures are provoked. ?No further concerns at this time ? ?Update 06/20/2019 JM: Gerald [name] is a 61 year old male who is being seen today for stroke follow-up accompanied by his wife.  Residual stroke deficits of right hemiparesis and dysarthria have been stable without worsening.  He remains nonambulatory and uses wheelchair for transfers.  Per wife, he was starting to take short steps with home health therapy that recently was completed.  He continues to receive Botox injections by Dr. Letta Pate with ongoing benefit.  He has since completed home health therapies due to insurance reasons and he and his wife are requesting  additional therapy as she felt this was benefiting him.  She plans on returning to work soon working 8-hour shifts Monday through Friday.  He was improving as far as being more independent and being able to do more ADLs independently and safely.  She is also questioning possible participation in PACE program and requesting additional information if able.  He has not had any seizure activity and remained stable on Keppra 1000 mg twice daily tolerating well without side effects.  No seizure activity since 05/2017.  He continues on Xarelto without bleeding or bruising.  Again discussed indication for Xarelto use and per wife, initiated by PCP for stroke prevention.  Denies history of atrial fibrillation, DVT or hypercoagulability.  Continues on atorvastatin without myalgias.  Blood pressure today satisfactory at 124/84.  Denies new or worsening stroke/TIA symptoms. ? ?Virtual visit 10/04/2018: Dow Blahnik is a 61 y.o. male who has been followed in this office with history of ischemic stroke in 08/2015 resulting in right hemiparesis, dysarthria and seizure disorder.  He was initially scheduled today for an in office visit but due to COVID-19 safety precautions, visit transitioned to telemedicine via WebEx with patient's consent. ?He has been stable from a stroke standpoint with residual deficits of spastic right hemiparesis, dysarthria/aphasia and memory impairment.  Since prior visit, he has completed therapies but is interested in participating in additional therapies at this time.  Denies improvement of residual deficits compared to prior visit.  He is currently nonambulatory and uses a wheelchair for transportation.  His significant other helps with transfers and ADLs.  He continues on baclofen 10 mg 3 times daily for spasticity with occasional muscle spasms which lasts only a few seconds.  He was previously being seen by Dr. Letta Pate for Botox injections but has not received recently due to transportation  difficulties.  He does endorse improvement with use of Botox injections.  Per sister and significant other, approximately 2 months ago aspirin discontinued and initiated Xarelto by his PCP but they were unable

## 2021-09-22 NOTE — Telephone Encounter (Signed)
Sent to Candy with [facility] to see if she would be able to take the patient.  ?

## 2021-09-22 NOTE — Patient Instructions (Addendum)
Continue current medications for seizure prevention including Keppra, Vimpat and Depakote at current dosages ? ?We will check lab work today to ensure satisfactory levels ? ?Repeat EEG to ensure no additional seizures are occurring  ? ?Reschedule follow-up with Dr. Letta Pate as prior visit was missed ? ?Continue Xarelto and atorvastatin for secondary stroke prevention measures ? ?Ensure continue close follow-up with PCP for aggressive stroke risk factor management ? ? ? ? ?Follow up in 6 months or call earlier if needed ? ? ? ? ?

## 2021-09-23 ENCOUNTER — Other Ambulatory Visit (INDEPENDENT_AMBULATORY_CARE_PROVIDER_SITE_OTHER): Payer: Self-pay

## 2021-09-23 DIAGNOSIS — Z0289 Encounter for other administrative examinations: Secondary | ICD-10-CM

## 2021-09-23 NOTE — Telephone Encounter (Signed)
Gerald [name] messaged me back and informed me they are able to take the patient.  ?

## 2021-09-29 DIAGNOSIS — I69322 Dysarthria following cerebral infarction: Secondary | ICD-10-CM | POA: Diagnosis not present

## 2021-09-29 DIAGNOSIS — R569 Unspecified convulsions: Secondary | ICD-10-CM | POA: Diagnosis not present

## 2021-09-29 DIAGNOSIS — I69398 Other sequelae of cerebral infarction: Secondary | ICD-10-CM | POA: Diagnosis not present

## 2021-09-29 DIAGNOSIS — Z7901 Long term (current) use of anticoagulants: Secondary | ICD-10-CM | POA: Diagnosis not present

## 2021-09-29 DIAGNOSIS — I69351 Hemiplegia and hemiparesis following cerebral infarction affecting right dominant side: Secondary | ICD-10-CM | POA: Diagnosis not present

## 2021-10-05 ENCOUNTER — Other Ambulatory Visit: Payer: PPO | Admitting: *Deleted

## 2021-10-08 ENCOUNTER — Ambulatory Visit: Payer: PPO | Admitting: Neurology

## 2021-10-08 DIAGNOSIS — R569 Unspecified convulsions: Secondary | ICD-10-CM

## 2021-10-08 DIAGNOSIS — Z5181 Encounter for therapeutic drug level monitoring: Secondary | ICD-10-CM | POA: Diagnosis not present

## 2021-10-12 DIAGNOSIS — R7303 Prediabetes: Secondary | ICD-10-CM | POA: Diagnosis not present

## 2021-10-12 DIAGNOSIS — F17290 Nicotine dependence, other tobacco product, uncomplicated: Secondary | ICD-10-CM | POA: Diagnosis not present

## 2021-10-12 DIAGNOSIS — I69398 Other sequelae of cerebral infarction: Secondary | ICD-10-CM | POA: Diagnosis not present

## 2021-10-12 DIAGNOSIS — Z7901 Long term (current) use of anticoagulants: Secondary | ICD-10-CM | POA: Diagnosis not present

## 2021-10-12 DIAGNOSIS — R569 Unspecified convulsions: Secondary | ICD-10-CM | POA: Diagnosis not present

## 2021-10-12 DIAGNOSIS — I69351 Hemiplegia and hemiparesis following cerebral infarction affecting right dominant side: Secondary | ICD-10-CM | POA: Diagnosis not present

## 2021-10-12 DIAGNOSIS — E785 Hyperlipidemia, unspecified: Secondary | ICD-10-CM | POA: Diagnosis not present

## 2021-10-12 DIAGNOSIS — I69322 Dysarthria following cerebral infarction: Secondary | ICD-10-CM | POA: Diagnosis not present

## 2021-10-12 DIAGNOSIS — I639 Cerebral infarction, unspecified: Secondary | ICD-10-CM | POA: Diagnosis not present

## 2021-10-12 DIAGNOSIS — I10 Essential (primary) hypertension: Secondary | ICD-10-CM | POA: Diagnosis not present

## 2021-10-13 LAB — CBC WITH DIFFERENTIAL/PLATELET
Basophils Absolute: 0 10*3/uL (ref 0.0–0.2)
Basos: 1 %
EOS (ABSOLUTE): 0.1 10*3/uL (ref 0.0–0.4)
Eos: 1 %
Hematocrit: 43.3 % (ref 37.5–51.0)
Hemoglobin: 14.2 g/dL (ref 13.0–17.7)
Immature Grans (Abs): 0 10*3/uL (ref 0.0–0.1)
Immature Granulocytes: 0 %
Lymphocytes Absolute: 3.3 10*3/uL — ABNORMAL HIGH (ref 0.7–3.1)
Lymphs: 55 %
MCH: 31.1 pg (ref 26.6–33.0)
MCHC: 32.8 g/dL (ref 31.5–35.7)
MCV: 95 fL (ref 79–97)
Monocytes Absolute: 0.5 10*3/uL (ref 0.1–0.9)
Monocytes: 9 %
Neutrophils Absolute: 2 10*3/uL (ref 1.4–7.0)
Neutrophils: 34 %
Platelets: 215 10*3/uL (ref 150–450)
RBC: 4.57 x10E6/uL (ref 4.14–5.80)
RDW: 12.4 % (ref 11.6–15.4)
WBC: 6 10*3/uL (ref 3.4–10.8)

## 2021-10-13 LAB — COMPREHENSIVE METABOLIC PANEL
ALT: 23 IU/L (ref 0–44)
AST: 20 IU/L (ref 0–40)
Albumin/Globulin Ratio: 1 — ABNORMAL LOW (ref 1.2–2.2)
Albumin: 3.4 g/dL — ABNORMAL LOW (ref 3.8–4.9)
Alkaline Phosphatase: 57 IU/L (ref 44–121)
BUN/Creatinine Ratio: 11 (ref 9–20)
BUN: 8 mg/dL (ref 6–24)
Bilirubin Total: 0.4 mg/dL (ref 0.0–1.2)
CO2: 20 mmol/L (ref 20–29)
Calcium: 8.9 mg/dL (ref 8.7–10.2)
Chloride: 102 mmol/L (ref 96–106)
Creatinine, Ser: 0.74 mg/dL — ABNORMAL LOW (ref 0.76–1.27)
Globulin, Total: 3.3 g/dL (ref 1.5–4.5)
Glucose: 88 mg/dL (ref 70–99)
Potassium: 3.9 mmol/L (ref 3.5–5.2)
Sodium: 140 mmol/L (ref 134–144)
Total Protein: 6.7 g/dL (ref 6.0–8.5)
eGFR: 105 mL/min/{1.73_m2} (ref >59)

## 2021-10-13 LAB — MAGNESIUM: Magnesium: 1.7 mg/dL (ref 1.6–2.3)

## 2021-10-13 LAB — LACOSAMIDE: Lacosamide: 10.2 ug/mL — ABNORMAL HIGH (ref 5.0–10.0)

## 2021-10-13 LAB — VALPROIC ACID LEVEL: Valproic Acid Lvl: 67 ug/mL (ref 50–100)

## 2021-10-14 ENCOUNTER — Telehealth: Payer: Self-pay

## 2021-10-14 NOTE — Telephone Encounter (Signed)
-----   Message from Frann Rider, NP sent at 10/13/2021  4:45 PM EDT ----- ?Wife requested telephone call for results - lab work largely satisfactory with improvement of prior deficiencies during hospitalization in February.  Keppra and Depakote level within normal limits.  Slightly elevated lacosamide level but as stable, will recommend continued dosage. Will plan on repeat levels to follow up visit and if no additional seizures at that time, will likely be able to decrease dose.  No changes recommended at this time. Will plan on following up in October as scheduled but please advised to call with any seizure activity. ?

## 2021-10-14 NOTE — Telephone Encounter (Signed)
I called pt's wife and left vm ( ok per dpr) with results.  ?Advised of plan in detail and advised her to call back if she had questions.  ?

## 2021-10-23 ENCOUNTER — Telehealth: Payer: Self-pay | Admitting: Adult Health

## 2021-10-23 NOTE — Telephone Encounter (Signed)
Will from [facility] called needing VO for additional OT for 2 times twice a weeek. Please advise. ?

## 2021-10-26 NOTE — Telephone Encounter (Signed)
Called Will, OT and advised him VO for additional OT for 2 times twice a week is approved. He verbalized understanding, appreciation. ? ?

## 2021-11-12 ENCOUNTER — Telehealth: Payer: Self-pay | Admitting: Adult Health

## 2021-11-12 DIAGNOSIS — I69398 Other sequelae of cerebral infarction: Secondary | ICD-10-CM | POA: Diagnosis not present

## 2021-11-12 DIAGNOSIS — I69322 Dysarthria following cerebral infarction: Secondary | ICD-10-CM | POA: Diagnosis not present

## 2021-11-12 DIAGNOSIS — R569 Unspecified convulsions: Secondary | ICD-10-CM | POA: Diagnosis not present

## 2021-11-12 DIAGNOSIS — Z7901 Long term (current) use of anticoagulants: Secondary | ICD-10-CM | POA: Diagnosis not present

## 2021-11-12 DIAGNOSIS — I69351 Hemiplegia and hemiparesis following cerebral infarction affecting right dominant side: Secondary | ICD-10-CM | POA: Diagnosis not present

## 2021-11-12 NOTE — Telephone Encounter (Signed)
Yes, would advise to notify PCP if this is a new issue for quicker evaluation but could be from his stroke and RLE weakness, in the mean time would recommend ensuring they are elevating legs and avoiding high sodium foods.

## 2021-11-12 NOTE — Telephone Encounter (Signed)
At 2:07 [facility] w/ [facility] left a vm stating re: the pt's right foot edema, it takes about 8 seconds to rebound.  She wanted Janett Billow, NP to be aware, she can be reached at [phone]

## 2021-11-12 NOTE — Telephone Encounter (Signed)
LVM informing Gerald [name] of NP's message. Repeated message and left # for further questions.

## 2021-12-14 DIAGNOSIS — I69351 Hemiplegia and hemiparesis following cerebral infarction affecting right dominant side: Secondary | ICD-10-CM | POA: Diagnosis not present

## 2021-12-14 DIAGNOSIS — Z7901 Long term (current) use of anticoagulants: Secondary | ICD-10-CM | POA: Diagnosis not present

## 2021-12-14 DIAGNOSIS — R569 Unspecified convulsions: Secondary | ICD-10-CM | POA: Diagnosis not present

## 2021-12-14 DIAGNOSIS — I69322 Dysarthria following cerebral infarction: Secondary | ICD-10-CM | POA: Diagnosis not present

## 2021-12-14 DIAGNOSIS — I69398 Other sequelae of cerebral infarction: Secondary | ICD-10-CM | POA: Diagnosis not present

## 2022-01-07 ENCOUNTER — Telehealth: Payer: Self-pay | Admitting: Adult Health

## 2022-01-07 DIAGNOSIS — I69398 Other sequelae of cerebral infarction: Secondary | ICD-10-CM

## 2022-01-07 DIAGNOSIS — G8110 Spastic hemiplegia affecting unspecified side: Secondary | ICD-10-CM

## 2022-01-07 DIAGNOSIS — I6329 Cerebral infarction due to unspecified occlusion or stenosis of other precerebral arteries: Secondary | ICD-10-CM

## 2022-01-07 NOTE — Telephone Encounter (Signed)
Pt's wife states the PT told her to call and make request that pt finishes the remainder of his therapy at OT.  Wife states she was told OT will be more beneficial for pt, please call.

## 2022-01-07 NOTE — Addendum Note (Signed)
Addended by: Minna Antis on: 01/07/2022 04:03 PM   Modules accepted: Orders

## 2022-01-07 NOTE — Addendum Note (Signed)
Addended by: Frann Rider L on: 01/07/2022 04:07 PM   Modules accepted: Orders

## 2022-01-07 NOTE — Telephone Encounter (Addendum)
Eula Fried for clarification. She stated patient is getting PT through [facility] [phone] with [name] Fuss PT [phone]. She understood that Brad advised he get OT. I advised her I;ll call to find out what Brad recommends. She verbalized understanding, appreciation. Jobie Quaker, PT who stated patient would benefit from a transfer to outpatient therapy, not OT. He stated patient has reached level with in home PT that could be improved if he went to [facility]. Leroy Sea also trying to help wife who also has history of stroke, to be able to care for him more easily.  I advised will send request to NP. Brad  verbalized understanding, appreciation.

## 2022-01-07 NOTE — Telephone Encounter (Signed)
Signed order for OP PT as requested. Also placed order to be evaluated by OP OT due to post stroke spastic hemiparesis. Thank you.

## 2022-01-13 ENCOUNTER — Telehealth: Payer: Self-pay | Admitting: Adult Health

## 2022-01-13 DIAGNOSIS — I69322 Dysarthria following cerebral infarction: Secondary | ICD-10-CM | POA: Diagnosis not present

## 2022-01-13 DIAGNOSIS — I69351 Hemiplegia and hemiparesis following cerebral infarction affecting right dominant side: Secondary | ICD-10-CM | POA: Diagnosis not present

## 2022-01-13 DIAGNOSIS — Z7901 Long term (current) use of anticoagulants: Secondary | ICD-10-CM | POA: Diagnosis not present

## 2022-01-13 DIAGNOSIS — R569 Unspecified convulsions: Secondary | ICD-10-CM | POA: Diagnosis not present

## 2022-01-13 DIAGNOSIS — I69398 Other sequelae of cerebral infarction: Secondary | ICD-10-CM | POA: Diagnosis not present

## 2022-01-13 NOTE — Telephone Encounter (Signed)
[facility]) request referral to get outpatient PT with [facility]. Would like a call from the nurse.

## 2022-01-13 NOTE — Telephone Encounter (Signed)
Called [place] and informed him Janett Billow NP put those orders in on 01/07/22 to [facility]. He stated he will let family know and advise they call to schedule. Pete verbalized understanding, appreciation.

## 2022-01-14 ENCOUNTER — Other Ambulatory Visit: Payer: Self-pay | Admitting: *Deleted

## 2022-01-14 NOTE — Patient Outreach (Signed)
  Care Coordination   01/14/2022 Name: Gerald [name] MRN: [id number] DOB: [birth year]   Care Coordination Outreach Attempts:  An unsuccessful telephone outreach was attempted today to offer the patient information about available care coordination services as a benefit of their health plan.   Follow Up Plan:  Additional outreach attempts will be made to offer the patient care coordination information and services.   Encounter Outcome:  No Answer  Care Coordination Interventions Activated:  No   Care Coordination Interventions:  No, not indicated    SIG Delma Drone C. Myrtie Neither, MSN, [facility] Gerontological Nurse Practitioner [facility] Care Management [phone]

## 2022-01-18 DIAGNOSIS — I10 Essential (primary) hypertension: Secondary | ICD-10-CM | POA: Diagnosis not present

## 2022-01-18 DIAGNOSIS — F17290 Nicotine dependence, other tobacco product, uncomplicated: Secondary | ICD-10-CM | POA: Diagnosis not present

## 2022-01-18 DIAGNOSIS — E785 Hyperlipidemia, unspecified: Secondary | ICD-10-CM | POA: Diagnosis not present

## 2022-01-18 DIAGNOSIS — I639 Cerebral infarction, unspecified: Secondary | ICD-10-CM | POA: Diagnosis not present

## 2022-01-18 DIAGNOSIS — R7303 Prediabetes: Secondary | ICD-10-CM | POA: Diagnosis not present

## 2022-01-21 ENCOUNTER — Other Ambulatory Visit: Payer: Self-pay | Admitting: Adult Health

## 2022-01-21 ENCOUNTER — Ambulatory Visit: Payer: Self-pay | Admitting: *Deleted

## 2022-01-21 NOTE — Patient Outreach (Signed)
  Care Coordination   01/21/2022 Name: ALONSO GAPINSKI MRN: [id number] DOB: [birth year]   Care Coordination Outreach Attempts:  A second unsuccessful outreach was attempted today to offer the patient with information about available care coordination services as a benefit of their health plan.    NP did talk with pt's wife and she referred me to talk with pt's sister Anson Crofts who manages Mr. Leather's medical care. PT DOES NOT HAVE A PCP!!! Called pt sister and left a message to return call.  Follow Up Plan:  Additional outreach attempts will be made to offer the patient care coordination information and services.   Encounter Outcome:  Pt. Request to Call Back  NP will try to contact pt sister. Care Coordination Interventions Activated:  No   Care Coordination Interventions:  No, not indicated    Simi Briel C. Myrtie Neither, MSN, [facility] Gerontological Nurse Practitioner [facility] Care Management [phone]

## 2022-01-25 ENCOUNTER — Other Ambulatory Visit: Payer: Self-pay

## 2022-01-25 MED ORDER — LACOSAMIDE 150 MG PO TABS: 150.0000 mg | ORAL_TABLET | Freq: Two times a day (BID) | ORAL | 1 refills | Status: DC

## 2022-01-25 NOTE — Telephone Encounter (Signed)
[place] drug registry checked, pt last seen 09/22/21, last filled 10/01/21. Refill is appropriate on my end.

## 2022-01-28 ENCOUNTER — Other Ambulatory Visit: Payer: Self-pay | Admitting: Adult Health

## 2022-02-03 ENCOUNTER — Ambulatory Visit: Payer: PPO | Attending: Adult Health

## 2022-02-03 DIAGNOSIS — M6281 Muscle weakness (generalized): Secondary | ICD-10-CM | POA: Diagnosis not present

## 2022-02-03 DIAGNOSIS — G8110 Spastic hemiplegia affecting unspecified side: Secondary | ICD-10-CM | POA: Diagnosis not present

## 2022-02-03 DIAGNOSIS — I6329 Cerebral infarction due to unspecified occlusion or stenosis of other precerebral arteries: Secondary | ICD-10-CM | POA: Insufficient documentation

## 2022-02-03 DIAGNOSIS — I69398 Other sequelae of cerebral infarction: Secondary | ICD-10-CM | POA: Insufficient documentation

## 2022-02-03 DIAGNOSIS — R2681 Unsteadiness on feet: Secondary | ICD-10-CM | POA: Diagnosis not present

## 2022-02-03 DIAGNOSIS — R2689 Other abnormalities of gait and mobility: Secondary | ICD-10-CM | POA: Diagnosis not present

## 2022-02-03 DIAGNOSIS — R269 Unspecified abnormalities of gait and mobility: Secondary | ICD-10-CM | POA: Insufficient documentation
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
## 2022-02-05 ENCOUNTER — Telehealth: Payer: Self-pay | Admitting: *Deleted

## 2022-02-05 NOTE — Patient Outreach (Signed)
  Care Coordination   02/05/2022 Name: Gerald [name] MRN: [id number] DOB: 02-14-1961   Care Coordination Outreach Attempts:  A third unsuccessful outreach was attempted today to offer the patient with information about available care coordination services as a benefit of their health plan.   Follow Up Plan:  No further outreach attempts will be made at this time. We have been unable to contact the patient to offer or enroll patient in care coordination services  Encounter Outcome:  No Answer  Care Coordination Interventions Activated:  No   Care Coordination Interventions:  No, not indicated    Jacqlyn Larsen [facility], BSN RN Case Manager [phone]

## 2022-02-11 ENCOUNTER — Encounter: Payer: Self-pay | Admitting: Physical Therapy

## 2022-02-11 ENCOUNTER — Ambulatory Visit: Payer: PPO | Admitting: Physical Therapy

## 2022-02-11 DIAGNOSIS — R2681 Unsteadiness on feet: Secondary | ICD-10-CM

## 2022-02-11 DIAGNOSIS — R2689 Other abnormalities of gait and mobility: Secondary | ICD-10-CM

## 2022-02-11 DIAGNOSIS — M6281 Muscle weakness (generalized): Secondary | ICD-10-CM
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
## 2022-02-11 NOTE — Patient Instructions (Signed)
Access Code: [email or web address] URL: [email or web address] Date: 02/11/2022 Prepared by: Elease Etienne  Exercises - Supine Hip Adduction Isometric with Ball  - 1 x daily - 5 x weekly - 1 sets - 10 reps - 5 hold - Supine Bridge  - 1 x daily - 5 x weekly - 1 sets - 10 reps - Supine Heel Slide  - 1 x daily - 5 x weekly - 1 sets - 10 reps - Bent Knee Fallouts  - 1 x daily - 5 x weekly - 1 sets - 10 reps - Seated Hamstring stretch  - 1 x daily - 7 x weekly - 1 sets - 3 reps - 30 sec hold - Seated Ankle Dorsiflexion Stretch  - 1 x daily - 7 x weekly - 1 sets - 5 reps - 10-15 sec hold

## 2022-02-23 ENCOUNTER — Ambulatory Visit: Payer: PPO | Attending: Adult Health | Admitting: Physical Therapy

## 2022-02-23 ENCOUNTER — Other Ambulatory Visit: Payer: Self-pay

## 2022-02-23 ENCOUNTER — Ambulatory Visit (INDEPENDENT_AMBULATORY_CARE_PROVIDER_SITE_OTHER): Payer: PPO

## 2022-02-23 VITALS — BP 148/71 | HR 57 | Temp 98.1°F | Ht 72.0 in

## 2022-02-23 DIAGNOSIS — G8110 Spastic hemiplegia affecting unspecified side: Secondary | ICD-10-CM

## 2022-02-23 DIAGNOSIS — F17210 Nicotine dependence, cigarettes, uncomplicated: Secondary | ICD-10-CM

## 2022-02-23 DIAGNOSIS — I10 Essential (primary) hypertension: Secondary | ICD-10-CM

## 2022-02-23 DIAGNOSIS — R278 Other lack of coordination: Secondary | ICD-10-CM | POA: Insufficient documentation

## 2022-02-23 DIAGNOSIS — I69351 Hemiplegia and hemiparesis following cerebral infarction affecting right dominant side: Secondary | ICD-10-CM | POA: Diagnosis not present

## 2022-02-23 DIAGNOSIS — R2681 Unsteadiness on feet: Secondary | ICD-10-CM | POA: Insufficient documentation

## 2022-02-23 DIAGNOSIS — G40909 Epilepsy, unspecified, not intractable, without status epilepticus: Secondary | ICD-10-CM | POA: Diagnosis not present

## 2022-02-23 DIAGNOSIS — M6281 Muscle weakness (generalized): Secondary | ICD-10-CM | POA: Insufficient documentation

## 2022-02-23 DIAGNOSIS — R2689 Other abnormalities of gait and mobility: Secondary | ICD-10-CM | POA: Insufficient documentation

## 2022-02-23 LAB — BASIC METABOLIC PANEL
Anion gap: 9 (ref 5–15)
BUN: 10 mg/dL (ref 6–20)
CO2: 26 mmol/L (ref 22–32)
Calcium: 9 mg/dL (ref 8.9–10.3)
Chloride: 104 mmol/L (ref 98–111)
Creatinine, Ser: 0.82 mg/dL (ref 0.61–1.24)
GFR, Estimated: >60 mL/min (ref >60)
Glucose, Bld: 81 mg/dL (ref 70–99)
Potassium: 4 mmol/L (ref 3.5–5.1)
Sodium: 139 mmol/L (ref 135–145)

## 2022-02-23 LAB — MAGNESIUM: Magnesium: 1.9 mg/dL (ref 1.7–2.4)

## 2022-02-23 MED ORDER — AMLODIPINE-OLMESARTAN 10-40 MG PO TABS: 1.0000 | ORAL_TABLET | Freq: Every day | ORAL | 2 refills | Status: DC

## 2022-02-23 MED ORDER — LACOSAMIDE 150 MG PO TABS: 150.0000 mg | ORAL_TABLET | Freq: Two times a day (BID) | ORAL | 1 refills | Status: DC

## 2022-02-23 NOTE — Patient Instructions (Signed)
Mr.Haakon R Caples, it was a pleasure seeing you today!  Today we discussed: Blood pressure: Blood pressure elevated today. STOP taking lisinopril, STOP taking amlodipine, START taking amlodipine-olmesartan 10-40 mg daily.   I have ordered the following labs today:  Lab Orders         BMP8+Anion Gap         Magnesium      Tests ordered today:  none  Referrals ordered today:   Referral Orders  No referral(s) requested today     I have ordered the following medication/changed the following medications:   Stop the following medications: Medications Discontinued During This Encounter  Medication Reason   amLODipine (NORVASC) 5 MG tablet Change in therapy   lisinopril (PRINIVIL,ZESTRIL) 40 MG tablet Change in therapy   Lacosamide 150 MG TABS Reorder   Lacosamide 150 MG TABS Entry Error     Start the following medications: Meds ordered this encounter  Medications   amLODipine-olmesartan (AZOR) 10-40 MG tablet    Sig: Take 1 tablet by mouth daily.    Dispense:  30 tablet    Refill:  2                    Follow-up:  1 month BP check    Please make sure to arrive 15 minutes prior to your next appointment. If you arrive late, you may be asked to reschedule.   We look forward to seeing you next time. Please call our clinic at [phone] if you have any questions or concerns. The best time to call is Monday-Friday from 9am-4pm, but there is someone available 24/7. If after hours or the weekend, call the main hospital number and ask for the Internal Medicine Resident On-Call. If you need medication refills, please notify your pharmacy one week in advance and they will send [place] a request.  Thank you for letting us take part in your care. [name]!  Thank you, Linward Natal, MD

## 2022-02-23 NOTE — Assessment & Plan Note (Signed)
Patient with post-CVA seizure disorder most recently hospitalized in 07/2021 with status epilepticus thought to be secondary to hypomagnesemia.  -continue Depakote 1500 mg daily and Vimpat 150 mg BID -Continue Keppra to '1500mg'$  twice daily -Continue follow-up with [facility] -Lacosamide refilled -Repeat magnesium

## 2022-02-23 NOTE — Addendum Note (Signed)
Addended by: Truddie Crumble on: 02/23/2022 10:58 AM   Modules accepted: Orders

## 2022-02-23 NOTE — Assessment & Plan Note (Signed)
BP Readings from Last 3 Encounters:  02/23/22 (!) 148/71  09/22/21 (!) 152/82  07/30/21 (!) 145/74   Patient's blood pressure remains elevated today.  Endorses taking pressures at but did not write them down and is unsure so encouraged patient to write them down and bring back at 1 month follow-up.  He is not at goal of 130/80 in the setting of stroke history.  Denies symptoms of hypertension and hypotension. -Discontinue lisinopril 40 mg daily and amlodipine 5 mg daily -Start amlodipine-olmesartan 10-40 mg daily -Continue metoprolol tartrate 25 mg twice daily -Repeat BMP -Return in 1 month for BP check

## 2022-02-23 NOTE — Assessment & Plan Note (Signed)
Patient presents with history of dominant left ACA/MCA infarct secondary to unknown source in 06/2015 with resultant right spastic hemiparesis, dysarthria, memory loss, and seizure activity.  Exam today appears consistent with post-CVA baseline of right spastic hemiparesis of upper and lower extremities, dysarthria, intact sensation.  Followed by [facility].  -Continue Xarelto and Lipitor for secondary stroke prevention. -Optimize antihypertensives -Follow-up with [facility] -PT

## 2022-02-23 NOTE — Progress Notes (Signed)
Internal Medicine Clinic Attending  I saw and evaluated the patient.  I personally confirmed the key portions of the history and exam documented by the resident  and I reviewed pertinent patient test results.  The assessment, diagnosis, and plan were formulated together and I agree with the documentation in the resident's note.  

## 2022-02-23 NOTE — Progress Notes (Signed)
   CC: establish care  HPI:  Gerald [name] is a 61 y.o.   Past Medical History:  Diagnosis Date   Alcohol abuse    Cocaine abuse ([place]) 2014   Hypertension    Infection of wound due to methicillin resistant Staphylococcus aureus (MRSA)    Seizures ([place]) 07/2015   had first seizures about 6 months after stroke   Status epilepticus ([place]) 07/27/2021   Stroke [facility]) 2014   denies residual on 07/03/2015   Stroke ([place]) 07/02/2015   "now weak on right side; speech problems" (07/03/2015)   Tobacco use     Social history: Lives with wife. 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 Has 5 sisters and one brother. Only knows of one sister having diabetes and another sister with heart murmur. Father had cancer but unable to specify. Mother died when patient was 78 but unsure of cause.  Endorses 2-3 cigarettes a day, 2-3 beers every other weekend, denies any other drug use currently. Endorses prior history of marijuana use.  Review of Systems:  See detailed assessment and plan for pertinent ROS.  Physical Exam:  Vitals:   02/23/22 0933  BP: (!) 148/71  Pulse: (!) 57  Temp: 98.1 F (36.7 C)  TempSrc: Oral  SpO2: 98%  Height: 6' (1.829 m)   Physical Exam Constitutional:      General: He is not in acute distress. HENT:     Head: Normocephalic and atraumatic.  Eyes:     Extraocular Movements: Extraocular movements intact.  Cardiovascular:     Rate and Rhythm: Normal rate and regular rhythm.     Heart sounds: No murmur heard. Pulmonary:     Effort: Pulmonary effort is normal.     Breath sounds: No wheezing, rhonchi or rales.  Neurological:     Mental Status: He is alert.     Comments: Dysarthric.  Right upper and lower extremity weakness with spasticity.  Sensation intact throughout.      Assessment & Plan:   See Encounters Tab for problem based charting.  Essential hypertension BP Readings from Last 3 Encounters:  02/23/22 (!) 148/71  09/22/21 (!) 152/82  07/30/21 (!) 145/74    Patient's blood pressure remains elevated today.  Endorses taking pressures at but did not write them down and is unsure so encouraged patient to write them down and bring back at 1 month follow-up.  He is not at goal of 130/80 in the setting of stroke history.  Denies symptoms of hypertension and hypotension. -Discontinue lisinopril 40 mg daily and amlodipine 5 mg daily -Start amlodipine-olmesartan 10-40 mg daily -Continue metoprolol tartrate 25 mg twice daily -Repeat BMP -Return in 1 month for BP check  Spastic hemiplegia affecting dominant side ([place]) Patient presents with history of dominant left ACA/MCA infarct secondary to unknown source in 06/2015 with resultant right spastic hemiparesis, dysarthria, memory loss, and seizure activity.  Exam today appears consistent with post-CVA baseline of right spastic hemiparesis of upper and lower extremities, dysarthria, intact sensation.  Follow[facility] by [facility].  -Continue Xarelto and Lipitor for secondary stroke prevention. -Optimize antihypertensives -Follow-up with [facility] -PT  Seizure disorder ([place]) Patient with post-CVA seizure disorder most recently hospitalized in 07/2021 with status epilepticus thought to be secondary to hypomagnesemia.  -continue Depakote 1500 mg daily and Vimpat 150 mg BID -Continue Keppra to '1500mg'$  twice daily -Continue follow-up with [facility] -Lacosamide refilled -Repeat magnesium   Patient seen with Dr. Heber [place]

## 2022-02-26 ENCOUNTER — Ambulatory Visit: Payer: PPO | Admitting: Physical Therapy

## 2022-02-26 ENCOUNTER — Encounter: Payer: Self-pay | Admitting: Physical Therapy

## 2022-02-26 VITALS — BP 149/90 | HR 60

## 2022-02-26 DIAGNOSIS — M6281 Muscle weakness (generalized): Secondary | ICD-10-CM | POA: Diagnosis not present

## 2022-02-26 DIAGNOSIS — I69351 Hemiplegia and hemiparesis following cerebral infarction affecting right dominant side: Secondary | ICD-10-CM | POA: Diagnosis not present

## 2022-02-26 DIAGNOSIS — R2689 Other abnormalities of gait and mobility: Secondary | ICD-10-CM

## 2022-02-26 DIAGNOSIS — R278 Other lack of coordination: Secondary | ICD-10-CM | POA: Diagnosis not present

## 2022-02-26 DIAGNOSIS — R2681 Unsteadiness on feet: Secondary | ICD-10-CM | POA: Diagnosis not present

## 2022-02-26 NOTE — Patient Instructions (Signed)
Access Code: [email or web address] URL: [email or web address] Date: 02/26/2022 Prepared by: Elease Etienne  Exercises - Supine Hip Adduction Isometric with Ball  - 1 x daily - 5 x weekly - 1 sets - 10 reps - 5 hold - Supine Bridge  - 1 x daily - 5 x weekly - 1 sets - 10 reps - Bent Knee Fallouts  - 1 x daily - 5 x weekly - 1 sets - 10 reps - Seated Hamstring stretch  - 1 x daily - 7 x weekly - 1 sets - 3 reps - 30 sec hold - Seated Ankle Dorsiflexion Stretch  - 1 x daily - 7 x weekly - 1 sets - 5 reps - 10-15 sec hold
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
## 2022-03-02 ENCOUNTER — Encounter: Payer: Self-pay | Admitting: Physical Therapy

## 2022-03-02 ENCOUNTER — Ambulatory Visit: Payer: PPO | Admitting: Physical Therapy

## 2022-03-02 VITALS — BP 147/79 | HR 62

## 2022-03-02 DIAGNOSIS — R2681 Unsteadiness on feet: Secondary | ICD-10-CM

## 2022-03-02 DIAGNOSIS — R2689 Other abnormalities of gait and mobility: Secondary | ICD-10-CM

## 2022-03-02 DIAGNOSIS — M6281 Muscle weakness (generalized): Secondary | ICD-10-CM
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
## 2022-03-04 ENCOUNTER — Encounter: Payer: Self-pay | Admitting: Physical Therapy

## 2022-03-04 ENCOUNTER — Ambulatory Visit: Payer: PPO | Admitting: Physical Therapy

## 2022-03-04 DIAGNOSIS — R2689 Other abnormalities of gait and mobility: Secondary | ICD-10-CM

## 2022-03-04 DIAGNOSIS — M6281 Muscle weakness (generalized): Secondary | ICD-10-CM

## 2022-03-04 DIAGNOSIS — R2681 Unsteadiness on feet: Secondary | ICD-10-CM | POA: Diagnosis not present
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
## 2022-03-09 ENCOUNTER — Ambulatory Visit: Payer: PPO | Admitting: Physical Therapy

## 2022-03-09 ENCOUNTER — Encounter: Payer: Self-pay | Admitting: Physical Therapy

## 2022-03-09 DIAGNOSIS — R2681 Unsteadiness on feet: Secondary | ICD-10-CM

## 2022-03-09 DIAGNOSIS — R278 Other lack of coordination: Secondary | ICD-10-CM

## 2022-03-09 DIAGNOSIS — R2689 Other abnormalities of gait and mobility: Secondary | ICD-10-CM

## 2022-03-09 DIAGNOSIS — M6281 Muscle weakness (generalized): Secondary | ICD-10-CM
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
## 2022-03-11 ENCOUNTER — Ambulatory Visit: Payer: PPO | Admitting: Physical Therapy

## 2022-03-11 ENCOUNTER — Encounter: Payer: Self-pay | Admitting: Physical Therapy

## 2022-03-11 DIAGNOSIS — I69351 Hemiplegia and hemiparesis following cerebral infarction affecting right dominant side: Secondary | ICD-10-CM

## 2022-03-11 DIAGNOSIS — M6281 Muscle weakness (generalized): Secondary | ICD-10-CM

## 2022-03-11 DIAGNOSIS — R278 Other lack of coordination: Secondary | ICD-10-CM

## 2022-03-11 DIAGNOSIS — R2689 Other abnormalities of gait and mobility: Secondary | ICD-10-CM

## 2022-03-11 DIAGNOSIS — R2681 Unsteadiness on feet: Secondary | ICD-10-CM | POA: Diagnosis not present
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
## 2022-03-16 ENCOUNTER — Encounter: Payer: Self-pay | Admitting: Physical Therapy

## 2022-03-16 ENCOUNTER — Ambulatory Visit: Payer: PPO | Attending: Adult Health | Admitting: Physical Therapy

## 2022-03-16 DIAGNOSIS — R278 Other lack of coordination: Secondary | ICD-10-CM | POA: Diagnosis not present

## 2022-03-16 DIAGNOSIS — M25621 Stiffness of right elbow, not elsewhere classified: Secondary | ICD-10-CM | POA: Diagnosis not present

## 2022-03-16 DIAGNOSIS — R2681 Unsteadiness on feet: Secondary | ICD-10-CM | POA: Diagnosis not present

## 2022-03-16 DIAGNOSIS — R208 Other disturbances of skin sensation: Secondary | ICD-10-CM | POA: Insufficient documentation

## 2022-03-16 DIAGNOSIS — I69351 Hemiplegia and hemiparesis following cerebral infarction affecting right dominant side: Secondary | ICD-10-CM | POA: Insufficient documentation

## 2022-03-16 DIAGNOSIS — R2689 Other abnormalities of gait and mobility: Secondary | ICD-10-CM | POA: Diagnosis not present

## 2022-03-16 DIAGNOSIS — M6281 Muscle weakness (generalized): Secondary | ICD-10-CM | POA: Diagnosis not present

## 2022-03-16 DIAGNOSIS — M25611 Stiffness of right shoulder, not elsewhere classified: Secondary | ICD-10-CM | POA: Insufficient documentation

## 2022-03-16 DIAGNOSIS — I69318 Other symptoms and signs involving cognitive functions following cerebral infarction: Secondary | ICD-10-CM | POA: Diagnosis not present

## 2022-03-16 DIAGNOSIS — R41842 Visuospatial deficit: Secondary | ICD-10-CM | POA: Diagnosis not present
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
## 2022-03-18 ENCOUNTER — Ambulatory Visit: Payer: PPO | Admitting: Physical Therapy

## 2022-03-19 ENCOUNTER — Ambulatory Visit: Payer: PPO | Admitting: Physical Therapy

## 2022-03-19 ENCOUNTER — Encounter: Payer: Self-pay | Admitting: Physical Therapy

## 2022-03-19 DIAGNOSIS — I69351 Hemiplegia and hemiparesis following cerebral infarction affecting right dominant side: Secondary | ICD-10-CM

## 2022-03-19 DIAGNOSIS — M6281 Muscle weakness (generalized): Secondary | ICD-10-CM

## 2022-03-19 DIAGNOSIS — R2689 Other abnormalities of gait and mobility: Secondary | ICD-10-CM

## 2022-03-19 DIAGNOSIS — R2681 Unsteadiness on feet: Secondary | ICD-10-CM | POA: Diagnosis not present
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
## 2022-03-25 ENCOUNTER — Encounter: Payer: Self-pay | Admitting: Adult Health

## 2022-03-25 ENCOUNTER — Ambulatory Visit: Payer: PPO | Admitting: Adult Health

## 2022-03-25 VITALS — BP 135/75 | HR 60 | Ht 72.0 in

## 2022-03-25 DIAGNOSIS — I6329 Cerebral infarction due to unspecified occlusion or stenosis of other precerebral arteries: Secondary | ICD-10-CM | POA: Diagnosis not present

## 2022-03-25 DIAGNOSIS — R569 Unspecified convulsions: Secondary | ICD-10-CM

## 2022-03-25 DIAGNOSIS — Z5181 Encounter for therapeutic drug level monitoring: Secondary | ICD-10-CM | POA: Diagnosis not present

## 2022-03-25 NOTE — Patient Instructions (Addendum)
Your Plan:  Continue working with therapies, [facility] discuss replacement parts for your AFO brace   Continue keppra, Depakote and lacosamide at current dosages   Continue Xarelto and atorvastatin for secondary stroke prevention measures   Continue to follow with PCP for aggressive stroke risk factor management including including BP goal<130/90, HLD with LDL goal<70 and DM with A1c.<7     Follow-up in 6 months or call earlier if needed    Thank you for coming to see [place] at [facility]. I hope we have been able to provide you high quality care today.  You may receive a patient satisfaction survey over the next few weeks. We would appreciate your feedback and comments so that we may continue to improve ourselves and the health of our patients.

## 2022-03-26 ENCOUNTER — Encounter: Payer: Self-pay | Admitting: Physical Medicine & Rehabilitation

## 2022-03-26 ENCOUNTER — Encounter: Payer: PPO | Attending: Physical Medicine & Rehabilitation | Admitting: Physical Medicine & Rehabilitation

## 2022-03-26 VITALS — BP 137/85 | HR 58 | Ht 72.0 in

## 2022-03-26 DIAGNOSIS — G8110 Spastic hemiplegia affecting unspecified side: Secondary | ICD-10-CM | POA: Insufficient documentation

## 2022-03-26 NOTE — Patient Instructions (Signed)
Will inject same dose and muscle groups next visit
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
## 2022-03-30 LAB — VALPROIC ACID LEVEL: Valproic Acid Lvl: 89 ug/mL (ref 50–100)

## 2022-03-30 LAB — LACOSAMIDE: Lacosamide: 12.5 ug/mL — ABNORMAL HIGH (ref 5.0–10.0)

## 2022-03-31 ENCOUNTER — Encounter: Payer: Self-pay | Admitting: Occupational Therapy

## 2022-03-31 ENCOUNTER — Ambulatory Visit: Payer: PPO | Admitting: Occupational Therapy

## 2022-03-31 DIAGNOSIS — M6281 Muscle weakness (generalized): Secondary | ICD-10-CM

## 2022-03-31 DIAGNOSIS — R2681 Unsteadiness on feet: Secondary | ICD-10-CM

## 2022-03-31 DIAGNOSIS — I69318 Other symptoms and signs involving cognitive functions following cerebral infarction: Secondary | ICD-10-CM

## 2022-03-31 DIAGNOSIS — I69351 Hemiplegia and hemiparesis following cerebral infarction affecting right dominant side: Secondary | ICD-10-CM

## 2022-03-31 DIAGNOSIS — R208 Other disturbances of skin sensation: Secondary | ICD-10-CM

## 2022-03-31 DIAGNOSIS — R41842 Visuospatial deficit: Secondary | ICD-10-CM

## 2022-03-31 DIAGNOSIS — M25611 Stiffness of right shoulder, not elsewhere classified: Secondary | ICD-10-CM

## 2022-03-31 DIAGNOSIS — M25621 Stiffness of right elbow, not elsewhere classified: Secondary | ICD-10-CM

## 2022-03-31 DIAGNOSIS — R278 Other lack of coordination: Secondary | ICD-10-CM
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
## 2022-04-02 ENCOUNTER — Encounter: Payer: Self-pay | Admitting: Physical Therapy

## 2022-04-02 ENCOUNTER — Ambulatory Visit: Payer: PPO | Admitting: Physical Therapy

## 2022-04-02 DIAGNOSIS — R2681 Unsteadiness on feet: Secondary | ICD-10-CM

## 2022-04-02 DIAGNOSIS — M6281 Muscle weakness (generalized): Secondary | ICD-10-CM

## 2022-04-02 DIAGNOSIS — I69351 Hemiplegia and hemiparesis following cerebral infarction affecting right dominant side: Secondary | ICD-10-CM

## 2022-04-02 DIAGNOSIS — R278 Other lack of coordination: Secondary | ICD-10-CM
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
## 2022-04-07 ENCOUNTER — Encounter: Payer: PPO | Admitting: Occupational Therapy

## 2022-04-08 ENCOUNTER — Ambulatory Visit: Payer: PPO | Admitting: Physical Therapy

## 2022-04-08 ENCOUNTER — Encounter: Payer: Self-pay | Admitting: Physical Therapy

## 2022-04-08 DIAGNOSIS — I69351 Hemiplegia and hemiparesis following cerebral infarction affecting right dominant side: Secondary | ICD-10-CM

## 2022-04-08 DIAGNOSIS — M6281 Muscle weakness (generalized): Secondary | ICD-10-CM

## 2022-04-08 DIAGNOSIS — R2681 Unsteadiness on feet: Secondary | ICD-10-CM

## 2022-04-08 DIAGNOSIS — R278 Other lack of coordination: Secondary | ICD-10-CM
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
## 2022-04-15 ENCOUNTER — Ambulatory Visit: Payer: PPO | Attending: Adult Health | Admitting: Physical Therapy

## 2022-04-15 ENCOUNTER — Encounter: Payer: Self-pay | Admitting: Physical Therapy

## 2022-04-15 DIAGNOSIS — M25611 Stiffness of right shoulder, not elsewhere classified: Secondary | ICD-10-CM | POA: Diagnosis not present

## 2022-04-15 DIAGNOSIS — I69351 Hemiplegia and hemiparesis following cerebral infarction affecting right dominant side: Secondary | ICD-10-CM | POA: Insufficient documentation

## 2022-04-15 DIAGNOSIS — R41842 Visuospatial deficit: Secondary | ICD-10-CM | POA: Insufficient documentation

## 2022-04-15 DIAGNOSIS — R208 Other disturbances of skin sensation: Secondary | ICD-10-CM | POA: Insufficient documentation

## 2022-04-15 DIAGNOSIS — M25621 Stiffness of right elbow, not elsewhere classified: Secondary | ICD-10-CM | POA: Diagnosis not present

## 2022-04-15 DIAGNOSIS — R278 Other lack of coordination: Secondary | ICD-10-CM | POA: Diagnosis not present

## 2022-04-15 DIAGNOSIS — R2681 Unsteadiness on feet: Secondary | ICD-10-CM | POA: Insufficient documentation

## 2022-04-15 DIAGNOSIS — I69318 Other symptoms and signs involving cognitive functions following cerebral infarction: Secondary | ICD-10-CM | POA: Diagnosis not present

## 2022-04-15 DIAGNOSIS — M6281 Muscle weakness (generalized): Secondary | ICD-10-CM | POA: Insufficient documentation

## 2022-04-15 DIAGNOSIS — R2689 Other abnormalities of gait and mobility: Secondary | ICD-10-CM | POA: Diagnosis not present
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
## 2022-04-19 DIAGNOSIS — I10 Essential (primary) hypertension: Secondary | ICD-10-CM | POA: Diagnosis not present

## 2022-04-19 DIAGNOSIS — I639 Cerebral infarction, unspecified: Secondary | ICD-10-CM | POA: Diagnosis not present

## 2022-04-19 DIAGNOSIS — R7303 Prediabetes: Secondary | ICD-10-CM | POA: Diagnosis not present

## 2022-04-19 DIAGNOSIS — E785 Hyperlipidemia, unspecified: Secondary | ICD-10-CM | POA: Diagnosis not present

## 2022-04-20 ENCOUNTER — Ambulatory Visit: Payer: PPO | Admitting: Physical Therapy

## 2022-04-21 ENCOUNTER — Other Ambulatory Visit: Payer: Self-pay

## 2022-04-29 ENCOUNTER — Ambulatory Visit: Payer: PPO | Admitting: Physical Therapy

## 2022-04-29 ENCOUNTER — Ambulatory Visit: Payer: PPO | Admitting: Occupational Therapy

## 2022-05-04 ENCOUNTER — Ambulatory Visit: Payer: PPO | Admitting: Occupational Therapy

## 2022-05-04 ENCOUNTER — Encounter: Payer: Self-pay | Admitting: Occupational Therapy

## 2022-05-04 ENCOUNTER — Ambulatory Visit: Payer: PPO | Admitting: Physical Therapy

## 2022-05-04 ENCOUNTER — Encounter: Payer: Self-pay | Admitting: Physical Therapy

## 2022-05-04 DIAGNOSIS — M6281 Muscle weakness (generalized): Secondary | ICD-10-CM | POA: Diagnosis not present

## 2022-05-04 DIAGNOSIS — I69351 Hemiplegia and hemiparesis following cerebral infarction affecting right dominant side: Secondary | ICD-10-CM

## 2022-05-04 DIAGNOSIS — M25611 Stiffness of right shoulder, not elsewhere classified: Secondary | ICD-10-CM

## 2022-05-04 DIAGNOSIS — R278 Other lack of coordination: Secondary | ICD-10-CM

## 2022-05-04 DIAGNOSIS — R2689 Other abnormalities of gait and mobility: Secondary | ICD-10-CM

## 2022-05-04 DIAGNOSIS — R2681 Unsteadiness on feet: Secondary | ICD-10-CM
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
## 2022-05-04 NOTE — Patient Instructions (Signed)
Flexion (Assistive)    Hold Rt wrist with Lt hand, Rt thumb side up and raise arms above head SLOWLY, keeping elbows as straight as possible. Can be done sitting or lying. Repeat _10___ times. Do _2-3___ sessions per day.    Flexion (Passive)    Sitting upright, slide forearm forward along table, bending from the waist until a stretch is felt. Hold __5__ seconds. Repeat _10___ times. Do __2__ sessions per day.  3. Hold plastic PVC pipe (2.5 ft long) both hands, slide down thighs to mid shin, then back all the way to belly button x 10 reps, 2x per day
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
## 2022-05-12 ENCOUNTER — Ambulatory Visit: Payer: PPO | Admitting: Occupational Therapy

## 2022-05-12 DIAGNOSIS — R2681 Unsteadiness on feet: Secondary | ICD-10-CM

## 2022-05-12 DIAGNOSIS — M6281 Muscle weakness (generalized): Secondary | ICD-10-CM

## 2022-05-12 DIAGNOSIS — M25621 Stiffness of right elbow, not elsewhere classified: Secondary | ICD-10-CM

## 2022-05-12 DIAGNOSIS — I69318 Other symptoms and signs involving cognitive functions following cerebral infarction: Secondary | ICD-10-CM

## 2022-05-12 DIAGNOSIS — I69351 Hemiplegia and hemiparesis following cerebral infarction affecting right dominant side: Secondary | ICD-10-CM

## 2022-05-12 DIAGNOSIS — R41842 Visuospatial deficit: Secondary | ICD-10-CM

## 2022-05-12 DIAGNOSIS — R278 Other lack of coordination: Secondary | ICD-10-CM

## 2022-05-12 DIAGNOSIS — R2689 Other abnormalities of gait and mobility: Secondary | ICD-10-CM

## 2022-05-12 DIAGNOSIS — R208 Other disturbances of skin sensation: Secondary | ICD-10-CM

## 2022-05-12 DIAGNOSIS — M25611 Stiffness of right shoulder, not elsewhere classified: Secondary | ICD-10-CM
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
## 2022-05-13 ENCOUNTER — Ambulatory Visit: Payer: PPO | Admitting: Occupational Therapy

## 2022-05-13 ENCOUNTER — Encounter: Payer: Self-pay | Admitting: Physical Therapy

## 2022-05-13 ENCOUNTER — Encounter: Payer: PPO | Attending: Physical Medicine & Rehabilitation | Admitting: Physical Medicine & Rehabilitation

## 2022-05-13 ENCOUNTER — Ambulatory Visit: Payer: PPO | Admitting: Physical Therapy

## 2022-05-13 ENCOUNTER — Encounter: Payer: Self-pay | Admitting: Physical Medicine & Rehabilitation

## 2022-05-13 ENCOUNTER — Encounter: Payer: Self-pay | Admitting: Occupational Therapy

## 2022-05-13 VITALS — BP 147/83 | HR 69 | Temp 98.2°F | Ht 72.0 in

## 2022-05-13 DIAGNOSIS — R2681 Unsteadiness on feet: Secondary | ICD-10-CM

## 2022-05-13 DIAGNOSIS — R278 Other lack of coordination: Secondary | ICD-10-CM

## 2022-05-13 DIAGNOSIS — M6281 Muscle weakness (generalized): Secondary | ICD-10-CM | POA: Diagnosis not present

## 2022-05-13 DIAGNOSIS — M25621 Stiffness of right elbow, not elsewhere classified: Secondary | ICD-10-CM

## 2022-05-13 DIAGNOSIS — R2689 Other abnormalities of gait and mobility: Secondary | ICD-10-CM

## 2022-05-13 DIAGNOSIS — G8110 Spastic hemiplegia affecting unspecified side: Secondary | ICD-10-CM

## 2022-05-13 DIAGNOSIS — I69351 Hemiplegia and hemiparesis following cerebral infarction affecting right dominant side: Secondary | ICD-10-CM

## 2022-05-13 MED ORDER — ABOBOTULINUMTOXINA 500 UNITS IM SOLR
1000.0000 [IU] | Freq: Once | INTRAMUSCULAR | Status: AC
  Administered 2022-05-13: 1000 [IU] via INTRAMUSCULAR
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
## 2022-05-13 NOTE — Patient Instructions (Signed)

## 2022-05-13 NOTE — Patient Instructions (Signed)
-   Supine March  - 1 x daily - 7 x weekly - 1 sets - 10 reps  Can practice using towel under right LE and performing limited passive range of right hip flexion.
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
## 2022-05-13 NOTE — Progress Notes (Signed)
Dysport Injection for spasticity using needle EMG guidance  Dilution: 200 Units/ml Indication: Severe spasticity which interferes with ADL,mobility and/or  hygiene and is unresponsive to medication management and other conservative care Informed consent was obtained after describing risks and benefits of the procedure with the patient. This includes bleeding, bruising, infection, excessive weakness, or medication side effects. A REMS form is on file and signed. Needle:  needle electrode Number of units per muscle Plan for next Dysport injection  Vast Med 100 0-1+ Vast Lat 100 1-2+ Rectus Fem 100 0-1+ Vast Int 100 0-1+   Biceps 200- medial 2-3+ Brachialis 200 medial 2-3+ Brachiorad 200 2-3+ All injections were done after obtaining appropriate EMG activity and after negative drawback for blood. The patient tolerated the procedure well. Post procedure instructions were given. A followup appointment was made 6 wk MD reassessment to monitor spasticity improvements

## 2022-05-20 ENCOUNTER — Ambulatory Visit: Payer: PPO | Admitting: Physical Therapy

## 2022-05-20 ENCOUNTER — Ambulatory Visit: Payer: PPO | Admitting: Occupational Therapy

## 2022-05-20 ENCOUNTER — Other Ambulatory Visit: Payer: Self-pay

## 2022-05-24 ENCOUNTER — Telehealth: Payer: Self-pay | Admitting: *Deleted

## 2022-05-24 NOTE — Telephone Encounter (Signed)
Call from patient's wife states that patient has loss his vision suddenly today.  Colors he can see. Some shapes he can see.  Has a history of Stroke.  Wife was advised to take patient to the ER as soon as possible.

## 2022-05-25 ENCOUNTER — Ambulatory Visit: Payer: PPO | Admitting: Physical Therapy

## 2022-05-26 ENCOUNTER — Ambulatory Visit: Payer: PPO | Attending: Adult Health | Admitting: Occupational Therapy

## 2022-05-27 ENCOUNTER — Emergency Department (HOSPITAL_COMMUNITY): Payer: PPO

## 2022-05-27 ENCOUNTER — Encounter: Payer: PPO | Admitting: Occupational Therapy

## 2022-05-27 ENCOUNTER — Other Ambulatory Visit: Payer: Self-pay

## 2022-05-27 ENCOUNTER — Encounter (HOSPITAL_COMMUNITY): Payer: Self-pay

## 2022-05-27 ENCOUNTER — Telehealth: Payer: Self-pay | Admitting: Adult Health

## 2022-05-27 ENCOUNTER — Inpatient Hospital Stay (HOSPITAL_COMMUNITY)
Admission: EM | Admit: 2022-05-27 | Discharge: 2022-06-01 | DRG: 177 | Disposition: A | Discharge status: Skilled Nursing Facility | Payer: PPO | Attending: Internal Medicine | Admitting: Internal Medicine

## 2022-05-27 ENCOUNTER — Ambulatory Visit: Payer: PPO | Admitting: Physical Therapy

## 2022-05-27 DIAGNOSIS — U071 COVID-19: Principal | ICD-10-CM | POA: Diagnosis present

## 2022-05-27 DIAGNOSIS — I10 Essential (primary) hypertension: Secondary | ICD-10-CM | POA: Diagnosis present

## 2022-05-27 DIAGNOSIS — Z823 Family history of stroke: Secondary | ICD-10-CM | POA: Diagnosis not present

## 2022-05-27 DIAGNOSIS — R41841 Cognitive communication deficit: Secondary | ICD-10-CM | POA: Diagnosis not present

## 2022-05-27 DIAGNOSIS — K5900 Constipation, unspecified: Secondary | ICD-10-CM | POA: Diagnosis present

## 2022-05-27 DIAGNOSIS — M6281 Muscle weakness (generalized): Secondary | ICD-10-CM | POA: Diagnosis not present

## 2022-05-27 DIAGNOSIS — E870 Hyperosmolality and hypernatremia: Secondary | ICD-10-CM | POA: Diagnosis not present

## 2022-05-27 DIAGNOSIS — F17210 Nicotine dependence, cigarettes, uncomplicated: Secondary | ICD-10-CM | POA: Diagnosis present

## 2022-05-27 DIAGNOSIS — F1411 Cocaine abuse, in remission: Secondary | ICD-10-CM | POA: Diagnosis present

## 2022-05-27 DIAGNOSIS — R000 Tachycardia, unspecified: Secondary | ICD-10-CM | POA: Diagnosis not present

## 2022-05-27 DIAGNOSIS — G4089 Other seizures: Secondary | ICD-10-CM | POA: Diagnosis not present

## 2022-05-27 DIAGNOSIS — Z79899 Other long term (current) drug therapy: Secondary | ICD-10-CM | POA: Diagnosis not present

## 2022-05-27 DIAGNOSIS — G40901 Epilepsy, unspecified, not intractable, with status epilepticus: Secondary | ICD-10-CM | POA: Diagnosis present

## 2022-05-27 DIAGNOSIS — J320 Chronic maxillary sinusitis: Secondary | ICD-10-CM | POA: Diagnosis not present

## 2022-05-27 DIAGNOSIS — I69351 Hemiplegia and hemiparesis following cerebral infarction affecting right dominant side: Secondary | ICD-10-CM

## 2022-05-27 DIAGNOSIS — Z8249 Family history of ischemic heart disease and other diseases of the circulatory system: Secondary | ICD-10-CM

## 2022-05-27 DIAGNOSIS — N179 Acute kidney failure, unspecified: Secondary | ICD-10-CM | POA: Diagnosis present

## 2022-05-27 DIAGNOSIS — F1011 Alcohol abuse, in remission: Secondary | ICD-10-CM | POA: Diagnosis present

## 2022-05-27 DIAGNOSIS — M62421 Contracture of muscle, right upper arm: Secondary | ICD-10-CM | POA: Diagnosis not present

## 2022-05-27 DIAGNOSIS — R4182 Altered mental status, unspecified: Secondary | ICD-10-CM | POA: Diagnosis not present

## 2022-05-27 DIAGNOSIS — R1311 Dysphagia, oral phase: Secondary | ICD-10-CM | POA: Diagnosis not present

## 2022-05-27 DIAGNOSIS — G928 Other toxic encephalopathy: Secondary | ICD-10-CM

## 2022-05-27 DIAGNOSIS — F29 Unspecified psychosis not due to a substance or known physiological condition: Secondary | ICD-10-CM | POA: Diagnosis not present

## 2022-05-27 DIAGNOSIS — I69320 Aphasia following cerebral infarction: Secondary | ICD-10-CM | POA: Diagnosis not present

## 2022-05-27 DIAGNOSIS — E860 Dehydration: Secondary | ICD-10-CM | POA: Diagnosis not present

## 2022-05-27 DIAGNOSIS — R0682 Tachypnea, not elsewhere classified: Secondary | ICD-10-CM | POA: Diagnosis not present

## 2022-05-27 DIAGNOSIS — Z7401 Bed confinement status: Secondary | ICD-10-CM | POA: Diagnosis not present

## 2022-05-27 DIAGNOSIS — R17 Unspecified jaundice: Secondary | ICD-10-CM | POA: Diagnosis not present

## 2022-05-27 DIAGNOSIS — R4701 Aphasia: Secondary | ICD-10-CM | POA: Diagnosis not present

## 2022-05-27 DIAGNOSIS — E876 Hypokalemia: Secondary | ICD-10-CM | POA: Diagnosis not present

## 2022-05-27 DIAGNOSIS — R410 Disorientation, unspecified: Secondary | ICD-10-CM | POA: Diagnosis not present

## 2022-05-27 DIAGNOSIS — G459 Transient cerebral ischemic attack, unspecified: Secondary | ICD-10-CM | POA: Diagnosis not present

## 2022-05-27 DIAGNOSIS — G9341 Metabolic encephalopathy: Secondary | ICD-10-CM | POA: Diagnosis present

## 2022-05-27 DIAGNOSIS — R791 Abnormal coagulation profile: Secondary | ICD-10-CM | POA: Diagnosis not present

## 2022-05-27 DIAGNOSIS — R5383 Other fatigue: Secondary | ICD-10-CM | POA: Diagnosis not present

## 2022-05-27 DIAGNOSIS — R7401 Elevation of levels of liver transaminase levels: Secondary | ICD-10-CM | POA: Diagnosis not present

## 2022-05-27 DIAGNOSIS — I77810 Thoracic aortic ectasia: Secondary | ICD-10-CM | POA: Diagnosis not present

## 2022-05-27 DIAGNOSIS — Z7901 Long term (current) use of anticoagulants: Secondary | ICD-10-CM

## 2022-05-27 DIAGNOSIS — F1010 Alcohol abuse, uncomplicated: Secondary | ICD-10-CM | POA: Diagnosis not present

## 2022-05-27 DIAGNOSIS — F17200 Nicotine dependence, unspecified, uncomplicated: Secondary | ICD-10-CM | POA: Diagnosis not present

## 2022-05-27 DIAGNOSIS — E785 Hyperlipidemia, unspecified: Secondary | ICD-10-CM | POA: Diagnosis not present

## 2022-05-27 DIAGNOSIS — R404 Transient alteration of awareness: Secondary | ICD-10-CM | POA: Diagnosis not present

## 2022-05-27 DIAGNOSIS — R4781 Slurred speech: Secondary | ICD-10-CM | POA: Diagnosis not present

## 2022-05-27 DIAGNOSIS — R279 Unspecified lack of coordination: Secondary | ICD-10-CM | POA: Diagnosis not present

## 2022-05-27 DIAGNOSIS — Z8673 Personal history of transient ischemic attack (TIA), and cerebral infarction without residual deficits: Secondary | ICD-10-CM | POA: Diagnosis not present

## 2022-05-27 DIAGNOSIS — R569 Unspecified convulsions: Secondary | ICD-10-CM | POA: Diagnosis not present

## 2022-05-27 LAB — CBC WITH DIFFERENTIAL/PLATELET
Abs Immature Granulocytes: 0.06 10*3/uL (ref 0.00–0.07)
Basophils Absolute: 0 10*3/uL (ref 0.0–0.1)
Basophils Relative: 0 %
Eosinophils Absolute: 0 10*3/uL (ref 0.0–0.5)
Eosinophils Relative: 0 %
HCT: 51.5 % (ref 39.0–52.0)
Hemoglobin: 16.6 g/dL (ref 13.0–17.0)
Immature Granulocytes: 1 %
Lymphocytes Relative: 30 %
Lymphs Abs: 2.4 10*3/uL (ref 0.7–4.0)
MCH: 31.8 pg (ref 26.0–34.0)
MCHC: 32.2 g/dL (ref 30.0–36.0)
MCV: 98.7 fL (ref 80.0–100.0)
Monocytes Absolute: 0.7 10*3/uL (ref 0.1–1.0)
Monocytes Relative: 9 %
Neutro Abs: 4.8 10*3/uL (ref 1.7–7.7)
Neutrophils Relative %: 60 %
Platelets: 327 10*3/uL (ref 150–400)
RBC: 5.22 MIL/uL (ref 4.22–5.81)
RDW: 13.7 % (ref 11.5–15.5)
WBC: 8 10*3/uL (ref 4.0–10.5)
nRBC: 0 % (ref 0.0–0.2)

## 2022-05-27 LAB — COMPREHENSIVE METABOLIC PANEL
ALT: 37 U/L (ref 0–44)
AST: 70 U/L — ABNORMAL HIGH (ref 15–41)
Albumin: 3.1 g/dL — ABNORMAL LOW (ref 3.5–5.0)
Alkaline Phosphatase: 49 U/L (ref 38–126)
Anion gap: 16 — ABNORMAL HIGH (ref 5–15)
BUN: 20 mg/dL (ref 6–20)
CO2: 21 mmol/L — ABNORMAL LOW (ref 22–32)
Calcium: 9.5 mg/dL (ref 8.9–10.3)
Chloride: 111 mmol/L (ref 98–111)
Creatinine, Ser: 1.31 mg/dL — ABNORMAL HIGH (ref 0.61–1.24)
GFR, Estimated: >60 mL/min (ref >60)
Glucose, Bld: 119 mg/dL — ABNORMAL HIGH (ref 70–99)
Potassium: 5 mmol/L (ref 3.5–5.1)
Sodium: 148 mmol/L — ABNORMAL HIGH (ref 135–145)
Total Bilirubin: 2 mg/dL — ABNORMAL HIGH (ref 0.3–1.2)
Total Protein: 8.6 g/dL — ABNORMAL HIGH (ref 6.5–8.1)

## 2022-05-27 LAB — PROTIME-INR
INR: 1.7 — ABNORMAL HIGH (ref 0.8–1.2)
Prothrombin Time: 19.9 seconds — ABNORMAL HIGH (ref 11.4–15.2)

## 2022-05-27 LAB — LACTIC ACID, PLASMA: Lactic Acid, Venous: 2.1 mmol/L (ref 0.5–1.9)

## 2022-05-27 MED ORDER — LEVETIRACETAM IN NACL 500 MG/100ML IV SOLN
500.0000 mg | Freq: Once | INTRAVENOUS | Status: AC
  Administered 2022-05-28: 500 mg via INTRAVENOUS
  Filled 2022-05-27: qty 100

## 2022-05-27 MED ORDER — LEVETIRACETAM IN NACL 1500 MG/100ML IV SOLN
1500.0000 mg | Freq: Two times a day (BID) | INTRAVENOUS | Status: DC
  Administered 2022-05-28 – 2022-05-29 (×4): 1500 mg via INTRAVENOUS
  Filled 2022-05-27 (×5): qty 100

## 2022-05-27 MED ORDER — HALOPERIDOL LACTATE 5 MG/ML IJ SOLN
2.0000 mg | Freq: Once | INTRAMUSCULAR | Status: AC
  Administered 2022-05-27: 2 mg via INTRAVENOUS
  Filled 2022-05-27: qty 1

## 2022-05-27 MED ORDER — ENOXAPARIN SODIUM 40 MG/0.4ML IJ SOSY: 40.0000 mg | PREFILLED_SYRINGE | INTRAMUSCULAR | Status: DC

## 2022-05-27 MED ORDER — LORAZEPAM 2 MG/ML IJ SOLN
1.0000 mg | Freq: Once | INTRAMUSCULAR | Status: AC
  Administered 2022-05-27: 1 mg via INTRAVENOUS
  Filled 2022-05-27: qty 1

## 2022-05-27 MED ORDER — LACOSAMIDE 150 MG PO TABS: 150.0000 mg | ORAL_TABLET | Freq: Two times a day (BID) | ORAL | 1 refills | Status: DC

## 2022-05-27 MED ORDER — SODIUM CHLORIDE 0.9 % IV BOLUS
1000.0000 mL | Freq: Once | INTRAVENOUS | Status: AC
  Administered 2022-05-27: 1000 mL via INTRAVENOUS

## 2022-05-27 NOTE — ED Provider Notes (Signed)
[facility] EMERGENCY DEPARTMENT Provider Note   CSN: [id number] Arrival date & time: 05/27/22  1634     History  Chief Complaint  Patient presents with   Altered Mental Status    Gerald [name] is a 61 y.o. male with history of left ACA stroke in 2017, residual right-sided hemiparesis and difficulty with speech, presenting to the ED by EMS concern for slurred speech, visual changes, and decline in mentation.  Supplemental history was provided by family as well as EMS, who reported the patient had been "in decline" for several days, and was reportedly "grabbing at things that were not there".  The patient himself appears to be able to answer "yes" appropriately to questions, and reports that he does have a headache, denies chest pain, reports that his vision has been blurry on and off for a long time.  He cannot provide any other information other than to say "yes".  He is level 5 caveat  Patient does have a history of seizures which were captured on long-term EEG monitoring in February, and started on seizure medications, including Keppra.  Medical record show EEG in May 2023 which was essentially normal, no epileptiform features.  He is on Xarelto and atorvastatin for secondary stroke prevention.  His sister Dodge Ator reports the patient suddenly complaining he "couldn't see" on Monday when he woke up, then complaining of vision cutting on/off this week.  He was weak, unable to stand up at all (at baseline he can pull himself up and roll over, but not walk), speech was much more slurred (normally he can ask basic questions and stutters, but can hold a conversation, often repeating himself). Today the patient couldn't respond appropriately to the family (sister) when she talked to him.  HPI     Home Medications Prior to Admission medications   Medication Sig Start Date End Date Taking? Authorizing Provider  acetaminophen (TYLENOL) 500 MG tablet Take 500 mg  by mouth every 6 (six) hours as needed (pain).    [provider]  amLODipine-olmesartan (AZOR) 10-40 MG tablet Take 1 tablet by mouth daily. 02/23/22 05/24/22  Linward Natal, MD  atorvastatin (LIPITOR) 20 MG tablet Take 1 tablet (20 mg total) by mouth daily at 6 PM. Patient taking differently: Take 20 mg by mouth daily. 09/06/16   Charlott Rakes, MD  divalproex (DEPAKOTE ER) 500 MG 24 hr tablet Take 3 tablets (1,500 mg total) by mouth at bedtime. 09/22/21   Frann Rider, NP  Lacosamide 150 MG TABS Take 1 tablet (150 mg total) by mouth 2 (two) times daily. 05/27/22   Lucious Groves, DO  levETIRAcetam (KEPPRA) 750 MG tablet Take 2 tablets (1,500 mg total) by mouth 2 (two) times daily. 09/22/21   Frann Rider, NP  metoprolol tartrate (LOPRESSOR) 25 MG tablet Take 1 tablet (25 mg total) by mouth 2 (two) times daily. 01/28/17   Charlott Rakes, MD  rivaroxaban (XARELTO) 20 MG TABS tablet Take 20 mg by mouth daily with supper. 09/13/18   [provider]      Allergies    Patient has no known allergies.    Review of Systems   Review of Systems  Physical Exam Updated Vital Signs BP (!) 136/111   Pulse (!) 103   Temp 97.8 F (36.6 C) (Axillary)   Resp 18   Ht 6' (1.829 m)   Wt 109.8 kg   SpO2 93%   BMI 32.83 kg/m  Physical Exam Constitutional:  General: He is not in acute distress.    Comments: Speech is slurred, can answer only "yes".  HENT:     Head: Normocephalic and atraumatic.  Eyes:     Conjunctiva/sclera: Conjunctivae normal.     Pupils: Pupils are equal, round, and reactive to light.  Cardiovascular:     Rate and Rhythm: Normal rate and regular rhythm.  Pulmonary:     Effort: Pulmonary effort is normal. No respiratory distress.  Abdominal:     General: There is no distension.     Tenderness: There is no abdominal tenderness.  Skin:    General: Skin is warm and dry.  Neurological:     General: No focal deficit present.     Mental Status: He is  alert. Mental status is at baseline.     Comments: Right-sided hemiparesis, left lower extremity weakness, left upper extremity strength 4 out of 5.  Speech is slurred, and limited.     ED Results / Procedures / Treatments   Labs (all labs ordered are listed, but only abnormal results are displayed) Labs Reviewed  COMPREHENSIVE METABOLIC PANEL - Abnormal; Notable for the following components:      Result Value   Sodium 148 (*)    CO2 21 (*)    Glucose, Bld 119 (*)    Creatinine, Ser 1.31 (*)    Total Protein 8.6 (*)    Albumin 3.1 (*)    AST 70 (*)    Total Bilirubin 2.0 (*)    Anion gap 16 (*)    All other components within normal limits  LACTIC ACID, PLASMA - Abnormal; Notable for the following components:   Lactic Acid, Venous 2.1 (*)    All other components within normal limits  PROTIME-INR - Abnormal; Notable for the following components:   Prothrombin Time 19.9 (*)    INR 1.7 (*)    All other components within normal limits  CULTURE, BLOOD (ROUTINE X 2)  CULTURE, BLOOD (ROUTINE X 2)  RESP PANEL BY RT-PCR (RSV, FLU A&B, COVID)  [id number]  CBC WITH DIFFERENTIAL/PLATELET  LACTIC ACID, PLASMA  URINALYSIS, ROUTINE W REFLEX MICROSCOPIC    EKG EKG Interpretation  Date/Time:  Thursday May 27 2022 16:47:19 EST Ventricular Rate:  113 PR Interval:  146 QRS Duration: 87 QT Interval:  295 QTC Calculation: 405 R Axis:   36 Text Interpretation: Sinus tachycardia Probable left atrial enlargement Abnormal R-wave progression, early transition Nonspecific repol abnormality, diffuse leads No sg changes from prior tracing Confirmed by Octaviano Glow [phone]) on 05/27/2022 5:14:33 PM  Radiology MR BRAIN WO CONTRAST  Result Date: 05/27/2022 CLINICAL DATA:  Transient ischemic attack EXAM: MRI HEAD WITHOUT CONTRAST TECHNIQUE: Multiplanar, multiecho pulse sequences of the brain and surrounding structures were obtained without intravenous contrast. COMPARISON:  None Available.  FINDINGS: Brain: No acute infarct, mass effect or extra-axial collection. No acute or chronic hemorrhage. 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 Multifocal chronic white matter hyperintense T2-weighted signal. The midline structures are normal. Vascular: Major flow voids are preserved. Skull and upper cervical spine: Normal calvarium and skull base. Visualized upper cervical spine and soft tissues are normal. Sinuses/Orbits:Right maxillary and bilateral ethmoid sinusitis. Normal orbits. IMPRESSION: 1. No acute intracranial abnormality. 2. 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 Electronically Signed   By: Ulyses Jarred M.D.   On: 05/27/2022 22:13   DG Chest Port 1 View  Result Date: 05/27/2022 CLINICAL DATA:  Slurred speech, possible sepsis EXAM: PORTABLE CHEST 1 VIEW COMPARISON:  01/15/2013 FINDINGS: Cardiac size is  within normal limits. Thoracic aorta is ectatic. Apparent shift of mediastinum to the right may be due to rotation. Lung fields are clear of any infiltrates or pulmonary edema. There is poor inspiration. There is no pleural effusion or pneumothorax. IMPRESSION: There are no signs of pulmonary edema or focal pulmonary consolidation. Electronically Signed   By: Elmer Picker M.D.   On: 05/27/2022 19:25   CT Head Wo Contrast  Result Date: 05/27/2022 CLINICAL DATA:  Mental status change. EXAM: CT HEAD WITHOUT CONTRAST TECHNIQUE: Contiguous axial images were obtained from the base of the skull through the vertex without intravenous contrast. RADIATION DOSE REDUCTION: This exam was performed according to the departmental dose-optimization program which includes automated exposure control, adjustment of the mA and/or kV according to patient size and/or use of iterative reconstruction technique. COMPARISON:  07/27/2021 FINDINGS: Brain: There is periventricular white matter decreased attenuation consistent with small vessel ischemic changes. Ventricles, sulci and cisterns are prominent consistent with age  related involutional changes. No acute intracranial hemorrhage, mass effect or shift. Encephalomalacia consistent with an old left ACA CVA. Compensatory dilatation left lateral ventricle frontal horn. Subinsular encephalomalacia in the left consistent with chronic lacunar CVA. Vascular: No hyperdense vessel or unexpected calcification. Skull: Normal. Negative for fracture or focal lesion. Sinuses/Orbits: Mucoperiosteal thickening consistent with chronic pansinusitis. Right maxillary antrum completely opacify with bony expansion consistent with a mucocele. IMPRESSION: 1. Atrophy and chronic small vessel ischemic changes. 2. Old left ACA and subinsular CVAs. 3. Chronic pansinusitis and right maxillary mucocele. 4. No acute intracranial process identified. Electronically Signed   By: Sammie Bench M.D.   On: 05/27/2022 18:00    Procedures Procedures    Medications Ordered in ED Medications  levETIRAcetam (KEPPRA) IVPB 500 mg/100 mL premix (has no administration in time range)  LORazepam (ATIVAN) injection 1 mg (1 mg Intravenous Given 05/27/22 1851)  haloperidol lactate (HALDOL) injection 2 mg (2 mg Intravenous Given 05/27/22 1939)  sodium chloride 0.9 % bolus 1,000 mL (1,000 mLs Intravenous New Bag/Given 05/27/22 2114)    ED Course/ Medical Decision Making/ A&P Clinical Course as of 05/27/22 2304  Thu May 27, 2022  2108 Neurologist Dr Rory Percy recommending stat MRI, admission to hospitalist/stepdown for EEG/seizure monitoring, which will also be ordered, neuro consult pending. [MT]  2220 MRI negative, plan for stepdown admission. [MT]  2243 Patient is now sleeping and sedated after his medications.  I have ordered IV Keppra to make up for his lack of oral intake at this time and not taking his Keppra medication. [MT]  2252 Admitted to IM teaching service [MT]    Clinical Course User Index [MT] Verba Ainley, Carola Rhine, MD                           Medical Decision Making Amount and/or Complexity of  Data Reviewed Labs: ordered. Radiology: ordered.  Risk Prescription drug management. Decision regarding hospitalization.   This patient presents to the Emergency Department with complaint of altered mental status.  This involves an extensive number of treatment options, and is a complaint that carries with it a high risk of complications and morbidity.  The differential diagnosis includes hypoglycemia vs metabolic encephalopathy vs infection (including cystitis) vs ICH vs stroke vs polypharmacy vs other  I ordered, reviewed, and interpreted labs, including no leukocytosis or acute anemia, patient does have some hyponatremia,.  Lactate very mildly elevated, may be related to poor oral intake. I ordered medication IV fluids, IV Ativan,  IV Haldol, IV Keppra I ordered imaging studies which included CT head, x-ray of the chest, MRI of the brain I independently visualized and interpreted imaging which showed no acute infarct noted on cranial imaging, chest x-ray with no evident pneumonia and the monitor tracing which showed sinus tachycardia Additional history was obtained from sister by phone Previous records obtained and reviewed showing prior stroke (MRI 2017), EEG record, neurology record I personally reviewed the patients ECG which showed sinus rhythm with no acute ischemic findings  I consulted neurology and discussed lab and imaging findings.  Please see ED course  After the interventions stated above, I reevaluated the patient and found he did remain sedated after the medications that were given to prevent agitation and self-harm.  He is maintaining his airway and not requiring intubation.  He is requiring 2 to 3 L nasal cannula which may be related to some hypopnea.  COVID and flu will be added on.  We are still needing to complete metabolic and infectious workup but at this time I do not see evidence of sepsis, shock, I do think he is stable for medical admission to complete the remainder of  his workup.  Neurology team will plan for EEG monitoring.         Final Clinical Impression(s) / ED Diagnoses Final diagnoses:  Altered mental status, unspecified altered mental status type    Rx / DC Orders ED Discharge Orders     None         Avabella Wailes, Carola Rhine, MD 05/27/22 [phone]

## 2022-05-27 NOTE — Telephone Encounter (Addendum)
ADDENDUM: previous message was entered in error. Patient is seeing Dr Dema Severin appropriate to refill.

## 2022-05-27 NOTE — Progress Notes (Signed)
Stat  EEG complete - results pending.  

## 2022-05-27 NOTE — ED Notes (Signed)
MD at bedside and aware that pt is requiring [place] at times and is not tolerating it so a NRB was placed to give some supplementation

## 2022-05-27 NOTE — ED Notes (Signed)
MD aware of nystagmus and seizure hx

## 2022-05-27 NOTE — Telephone Encounter (Signed)
Called and further advised patient's spouse to take pt to Emergency Room. Spouse was in agreement and had no further questions at this time. Advised to call back if any other questions arise.

## 2022-05-27 NOTE — ED Notes (Signed)
Pt transported to CT ?

## 2022-05-27 NOTE — Telephone Encounter (Signed)
Pt wife is calling. Stated pt has blur vision and weakness and can't walk. Stated his speech is slurring. Advised her to take pt to Emergency room or Urgent Care. She is requesting a call back from nurse.

## 2022-05-27 NOTE — ED Triage Notes (Signed)
Per EMS that is caretaker for pt states speech is more slurred and vision is off, pt is bed bound at baseline family states since Monday he has been declining and grabbing at things in the air that is not there.  Alert to name only ems states that for 10 sec. Appeared the pdt had a focal seizure   On arrival; pt is with tremors and extremely dry

## 2022-05-27 NOTE — H&P (Signed)
Date: 05/27/2022               Patient Name:  Gerald [name] MRN: [id number]  DOB: 20-Oct-1960 Age / Sex: 61 y.o., male   PCP: Gerald Natal, MD         Medical Service: Internal Medicine Teaching Service         Attending Physician: Dr. Charise Killian, MD    First Contact: Dr. Starlyn Skeans, MD  Pager: [phone]  Second Contact: Dr. Idamae Schuller, MD  Pager: [phone]       After Hours (After 5p/  First Contact Pager: [phone]  weekends / holidays): Second Contact Pager: [phone]   Chief Complaint: AMS  History of Present Illness: Mr. Gerald [name] is a 61y/o male with a pmh of AUD, L ACA infarct,spastic R hemiparesis and aphasia, HTN, seizures who presents with AMS.Patient was evaluated at the bedside with his wife present. He responds in one word answers, primarily yes/no, and also repeats words said by others. Otherwise he is not able to communicate about his current status. Thus, history was obtained through he wife. She says he has had a productive cough since Monday that is new for him, which she attributes to the flu as she feels she recently had the flu. She says he also tends to look at people since this time but will either stare through them or his eyes will deviate upward and he will not track with his eyes. He usually has a good appetite and has not been eating, but has not had any choking with eating. He also usually smokes cigarettes and drinks beer with the football games which he is no longer doing. His wife says he will sit in front of the TV but has not been attentive to it. He usually can walk with her help but has had complete extremity weakness, and is also unable to feed himself which he can to at baseline. He usually uses a bedside urinal and has been attempting this but urinating on himself. Of note she says he is not urinating more frequently than usual. She says he normally has 2 BMs daily and has not had a bowel movement the entire week.  He has had some trouble with memory  and directions since his CVA and at baseline has required help with bathing, cooking, cleaning.  Meds:  Current Outpatient Medications  Medication Instructions   acetaminophen (TYLENOL) 500 mg, Oral, Every 6 hours PRN   amLODipine-olmesartan (AZOR) 10-40 MG tablet 1 tablet, Oral, Daily   atorvastatin (LIPITOR) 20 mg, Oral, Daily-1800   divalproex (DEPAKOTE ER) 1,500 mg, Oral, Daily at bedtime   Lacosamide 150 mg, Oral, 2 times daily   levETIRAcetam (KEPPRA) 1,500 mg, Oral, 2 times daily   metoprolol tartrate (LOPRESSOR) 25 mg, Oral, 2 times daily   rivaroxaban (XARELTO) 20 mg, Oral, Daily with supper       Allergies: Allergies as of 05/27/2022   (No Known Allergies)   Past Medical History:  Diagnosis Date   Alcohol abuse    Cocaine abuse ([place]) 2014   Hypertension    Infection of wound due to methicillin resistant Staphylococcus aureus (MRSA)    Seizures ([place]) 07/2015   had first seizures about 6 months after stroke   Status epilepticus ([place]) 07/27/2021   Stroke ([place]) 2014   denies residual on 07/03/2015   Stroke ([place]) 07/02/2015   "now weak on right side; speech problems" (07/03/2015)   Tobacco use     Family  History:  Family History  Problem Relation Age of Onset   Hypertension Mother    Hypertension Father    Stroke Paternal Aunt      Social History: Lives at home with his wife. Able to toilet and feed himself but otherwise dependent for IADLs/ADLs. Has a walker but unable to use it, usually uses his wife as support to help him walk. Drinks up to a 6 pack when watching football. Does use tobacco. History of cocaine abuse.  Review of Systems: A complete ROS was negative except as per HPI.   Physical Exam: Blood pressure (!) 136/111, pulse (!) 103, temperature 97.8 F (36.6 C), temperature source Axillary, resp. rate 18, height 6' (1.829 m), weight 109.8 kg, SpO2 93 %. Gen: chronically ill appearing, decreased alertness, somnolent HEENT: No scleral icterus,  conjunctival pallor, dry mucous membranes OX:[id number], regular rhythm, normal s1/s2, no m/r/g Pulm:normal wob: stertorous breathing, LCTAB at the apical lung fields although difficult to hear given transmitted upper airway sounds Abd: soft, NT,ND, no palpable masses Extremities:Warm, dry, no LE edema Neuro: Limited by patient mental status and ability to participate.Will open eyes to voice, moves upper extremities to command, no spontaneous LE movement or LE movement to pain, no abnormal posturing, does not track with his eyes, one word answers, repeats words numerous times.  EKG: personally reviewed my interpretation is sinus tachycardia, no acute ST/T wave changes  CXR: personally reviewed my interpretation is no consolidation, interstitial edema, pneumothorax  Assessment & Plan by Problem: Active Problems:   * No active hospital problems. *  Mr. Gerald [name] is a 60y/o male with a pmh of AUD, L ACA infarct,spastic R hemiparesis and aphasia, HTN, seizures who presents with AMS  Acute Metabolic encephalopathy  CT head without acute intracranial pathology.MRI negative for acute stroke. No evidence of active infection given afebrile, wbc wnl,negative CXR. Will follow up RPP to rule out any causes. No urinary symptoms per the wife but will follow up UA as a cause. Will also follow [place] obtained in the ED.Does have new cough with audible upper respiratory congestion which may have led to poor PO intake, dehydration, and delirium in a patient who has a history of CVA with memory changes possibly indicating early vascular dementia. Minimal hypernatremia to 148 and calcium wnl not likely contributory. Does have a history of hypomagnesemia, will follow magnesium level in the AM. No centrally acting medications other than anti-seizure medications. Curious if this could be hepatic encephalopathy as the patient has AUD with AST elevated to 70 and bili elevated to 2.0. His platelets are wnl, albumin low at 3.1,  INR elevated to 1.7 suggesting some synthetic dysfunction. That said, he is on xarelto so I am unsure the contribution of this to his INR given that the last INR was 6 years ago. No history of biliary or liver imaging. Lactate minimally elevated to 2.1, do not suspect this is causing this degree of AMS. Given his low albumin and reported AUD suggestive of nutritional deficiency, do have to worry about thiamine deficiency which can also cause a lactic acidosis. Do not suspect wernicke's at this time given more likely causes. Will also check B12 and TSH for completeness of workup given his cognitive changes. Given the blank stares, AMS and loss of IADLs/ADLs with a known history of seizures these may all be subclinical seizures with postictal confusion. Please see seizure problem for A/P related to this and evaluation for Depakote induced hyperammonemia.  -F/u UDS,UA, BCX, RPP -F/u TSH, B12,  Mag, Ammonia -Thiamine '100mg'$  -SLP,PT,OT, RD  Seizure  Hospitalized 07/2021 for seizure with status epilepticus secondary to sleep deprivation and hypomagnesemia. EEG at that time demonstrated electrographic seizures arising from the left hemisphere. Lactate elevated to 2.1 although may be related to his AKI. Per neurology getting anti-seizure med levels purely to check compliance, although at most recent neurology outpatient visit they were therapeutic indicating compliance. Also checking a depakote level as supratherapeutic ranges can suggest possibility of toxicity. Also checking ammonia as depakote can increase this. Will continue to follow for stat EEG results. -Hold Depakote -Keppra '1500mg'$  IV -lacosamide '150mg'$  IV -Neurology consulted  Pre-renal AKI Dehydration Creatinine elevated to 1.31 with baseline of 0.8-1.0. Mildly hypernatremia to 148, poor skin turgor, sinus tachycardia to 130s all consistent with intravascular volume depletion. S/p 1L IVF bolus. 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 Synthetic function is not overtly decreased as discussed above in the encephalopathy problem. No evidence of ascites,palmar erythema, caput medusa to suggest decompensated cirrhosis, did not specifically question the patients wife about melena or hematochezia. Would benefit from RUQ as an out patient given no prior imaging of the liver. Currently drinks up to a 6 pack on football nights per the wife, would also benefit from alcohol cessation.  Gamma Gap May be in the setting of current or prior infection.HIV negative 10 months ago. Will get HCV screening given no history of this in the chart. Does not have prior elevated gamma gap or elevated calcium and his acute renal function change is consistent with dehydration, so will not get SPEP or UPEP but can repeat labs as an outpatient to see if the gamma gap closes. -F/u HCV  Hx L ACA CVA c/b spastic R hemiparesis and aphasia - Hold Xarelto, lovenox injection 100 BID - Hold Atorvastatin  HTN -Amlodipine olmesartan 10-40  Dispo: Admit patient to Inpatient with expected length of stay greater than 2 midnights.  Signed: Iona Coach, MD 05/27/2022, 10:51 PM  Pager: [phone] After 5pm on weekdays and 1pm on weekends: On Call pager: [phone]

## 2022-05-27 NOTE — Consult Note (Signed)
Neurology Consultation  Reason for Consult: AMS Referring Physician: Dr Langston Masker  CC: Altered mental status  History is obtained from: Patient's sister, patient  HPI: Gerald [name] is a 61 y.o. 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  Sister reports that he has been having some respiratory issues of coughing and breathing difficulty since Monday and since then has been having a difficult time talking.  He only speaks short sentences or keeps repeating himself.  Sister also reports that he had trouble seeing and it seemed like he could not see properly. No apparent seizures noted by family. Sister cannot tell me when the patient actually had a witnessed seizure. Patient is unable to provide any reasonable history.   LKW: 05/24/2022-Monday IV thrombolysis given?: no, outside the window Premorbid modified Rankin scale (mRS): 3   ROS: Full ROS was performed and is negative except as noted in the HPI.  Past Medical History:  Diagnosis Date   Alcohol abuse    Cocaine abuse ([place]) 2014   Hypertension    Infection of wound due to methicillin resistant Staphylococcus aureus (MRSA)    Seizures ([place]) 07/2015   had first seizures about 6 months after stroke   Status epilepticus ([employer]) 07/27/2021   Stroke ([place]) 2014   denies residual on 07/03/2015   Stroke ([place]) 07/02/2015   "now weak on right side; speech problems" (07/03/2015)   Tobacco use      Family History  Problem Relation Age of Onset   Hypertension Mother    Hypertension Father    Stroke Paternal Aunt      Social History:   reports that he has been smoking cigarettes. He has a 8.50 pack-year smoking history. He has never used smokeless tobacco. He reports current alcohol use of about 1.0 - 2.0  standard drink of alcohol per week. He reports that he does not use drugs.  Medications  Current Facility-Administered Medications:    levETIRAcetam (KEPPRA) IVPB 500 mg/100 mL premix, 500 mg, Intravenous, Once, Trifan, Carola Rhine, MD  Current Outpatient Medications:    acetaminophen (TYLENOL) 500 MG tablet, Take 500 mg by mouth every 6 (six) hours as needed (pain)., Disp: , Rfl:    amLODipine-olmesartan (AZOR) 10-40 MG tablet, Take 1 tablet by mouth daily., Disp: 30 tablet, Rfl: 2   atorvastatin (LIPITOR) 20 MG tablet, Take 1 tablet (20 mg total) by mouth daily at 6 PM. (Patient taking differently: Take 20 mg by mouth daily.), Disp: 30 tablet, Rfl: 3   divalproex (DEPAKOTE ER) 500 MG 24 hr tablet, Take 3 tablets (1,500 mg total) by mouth at bedtime., Disp: 270 tablet, Rfl: 3   Lacosamide 150 MG TABS, Take 1 tablet (150 mg total) by mouth 2 (two) times daily., Disp: 180 tablet, Rfl: 1   levETIRAcetam (KEPPRA) 750 MG tablet, Take 2 tablets (1,500 mg total) by mouth 2 (two) times daily., Disp: 360 tablet, Rfl: 3   metoprolol tartrate (LOPRESSOR) 25 MG tablet, Take 1 tablet (25 mg total) by mouth 2 (two) times daily., Disp: 60 tablet, Rfl: 0   rivaroxaban (XARELTO) 20 MG TABS tablet, Take 20 mg by mouth daily with supper., Disp: , Rfl:    Exam: Current vital signs: BP (!) 136/111   Pulse (!) 103   Temp 97.8 F (36.6 C) (Axillary)   Resp  18   Ht 6' (1.829 m)   Wt 109.8 kg   SpO2 93%   BMI 32.83 kg/m  Vital signs in last 24 hours: Temp:  [97.8 F (36.6 C)-98.8 F (37.1 C)] 97.8 F (36.6 C) (12/14 2116) Pulse Rate:  [102-157] 103 (12/14 2115) Resp:  [18-35] 18 (12/14 2115) BP: (124-177)/(90-130) 136/111 (12/14 2115) SpO2:  [88 %-98 %] 93 % (12/14 2115) Weight:  [109.8 kg] 109.8 kg (12/14 1641) General: Drowsy and lethargic HEENT: Normocephalic atraumatic Lungs: Rales all over Cardiovascular: Regular rhythm Neurologic exam Patient is very drowsy and lethargic, he is able to tell  me his name He is able to name simple objects but then perseverates. He is not able to tell me his age or the correct month. He is not able to repeat sentences He is not able to follow multistep commands at all Cranial nerves: Pupils are equal round reactive to light, extraocular movements appear intact, visual field examination is difficult because of him not following commands well but does seem to have some amount of blink to threat from both sides which is inconsistent, mild right lower facial weakness. Motor examination with spastic right hemiparesis, full strength on the left. Sensation: Intact Coordination difficult to assess given his mentation  NIHSS 1a Level of Conscious.: 1 1b LOC Questions: 2 1c LOC Commands: 0 2 Best Gaze: 0 3 Visual: 0 4 Facial Palsy: 1 5a Motor Arm - left: 0 5b Motor Arm - Right: 3 6a Motor Leg - Left: 0 6b Motor Leg - Right: 2 7 Limb Ataxia: 0 8 Sensory: 1 9 Best Language: 1 10 Dysarthria: 2 11 Extinct. and Inatten.: 0 TOTAL: 13   Labs I have reviewed labs in epic and the results pertinent to this consultation are:  CBC    Component Value Date/Time   WBC 8.0 05/27/2022 1740   RBC 5.22 05/27/2022 1740   HGB 16.6 05/27/2022 1740   HGB 14.2 10/08/2021 1220   HCT 51.5 05/27/2022 1740   HCT 43.3 10/08/2021 1220   PLT 327 05/27/2022 1740   PLT 215 10/08/2021 1220   MCV 98.7 05/27/2022 1740   MCV 95 10/08/2021 1220   MCH 31.8 05/27/2022 1740   MCHC 32.2 05/27/2022 1740   RDW 13.7 05/27/2022 1740   RDW 12.4 10/08/2021 1220   LYMPHSABS 2.4 05/27/2022 1740   LYMPHSABS 3.3 (H) 10/08/2021 1220   MONOABS 0.7 05/27/2022 1740   EOSABS 0.0 05/27/2022 1740   EOSABS 0.1 10/08/2021 1220   BASOSABS 0.0 05/27/2022 1740   BASOSABS 0.0 10/08/2021 1220    CMP     Component Value Date/Time   NA 148 (H) 05/27/2022 1740   NA 140 10/08/2021 1220   K 5.0 05/27/2022 1740   CL 111 05/27/2022 1740   CO2 21 (L) 05/27/2022 1740   GLUCOSE 119 (H)  05/27/2022 1740   BUN 20 05/27/2022 1740   BUN 8 10/08/2021 1220   CREATININE 1.31 (H) 05/27/2022 1740   CREATININE 0.94 04/20/2016 1022   CALCIUM 9.5 05/27/2022 1740   PROT 8.6 (H) 05/27/2022 1740   PROT 6.7 10/08/2021 1220   ALBUMIN 3.1 (L) 05/27/2022 1740   ALBUMIN 3.4 (L) 10/08/2021 1220   AST 70 (H) 05/27/2022 1740   ALT 37 05/27/2022 1740   ALKPHOS 49 05/27/2022 1740   BILITOT 2.0 (H) 05/27/2022 1740   BILITOT 0.4 10/08/2021 1220   GFRNONAA >60 05/27/2022 1740   GFRNONAA >89 04/20/2016 1022   GFRAA >60 05/22/2016 5400  GFRAA >89 04/20/2016 1022   Imaging I have reviewed the images obtained: MRI brain with no evidence of acute stroke, old left ACA infarct Chest x-ray has been read as unremarkable  Assessment: 61 year old with prior history of old left ACA infarct with residual left hemiparesis and residual aphasia along with seizure disorder with unknown compliance to medication, prior history of alcohol and drug abuse, presenting for evaluation of 3 to 4 days worth of altered mental status.  Sister reports that he has been having a hard time with his respiratory symptoms of cough and shortness of breath and since then has been having a difficult time talking only speaking short sentences and repeating himself.  No witnessed seizure activity. Given his history of prior presentation of altered mental status with subclinical status epilepticus, that should always be kept in mind. I was contacted over the phone by the EDP and recommended that an MRI of the brain be done to rule out an acute stroke given his prior history of strokes.  MRI brain is not consistent with an acute stroke.  He has a lot of white matter disease as well as chronic strokes including the large 1 in the left ACA territory.  Impression: Evaluate for seizures/subclinical status epilepticus Evaluate for toxic metabolic encephalopathy Evaluate for recrudescence of stroke symptoms in the setting of acute  illness Evaluate for underlying infections  Recommendations: Chest x-ray has been read unremarkable I would recommend checking a UA I would also recommend checking Depakote level and ammonia levels since he is on Depakote at home-I have ordered the levels. He is currently on Xarelto-which I would recommend continuing. For now I would recommend a Keppra load of 2 g-she has received 500 mg in the ER.  From tomorrow, I would recommend continuing home dose of Keppra 1500 twice daily. I would recommend continuing home dose of Vimpat 150 twice daily Once the Depakote level comes back, if it is therapeutic, continue home dose of Depakote 1500 daily which can be done in divided doses of 750 mg twice daily while he is hospitalized.  If he subtherapeutic, I will recommend an appropriate load to get him to therapeutic level and then start him on the home dose of 750 mg twice daily. Stat EEG has been ordered-if there is any evidence of ongoing seizure activity, would continue him on long-term EEG Maintain seizure precautions Check respiratory panel for any evidence of underlying respiratory viral illness that might have lowered seizure threshold or cause toxic metabolic encephalopathy Check drug screen and alcohol level as well.  Preliminary plan discussed with Dr. Langston Masker.  Final plan relayed to the admitting service's resident physicians.  -- Amie Portland, MD Neurologist [facility] Pager: [phone]  CRITICAL CARE ATTESTATION Performed by: Amie Portland, MD Total critical care time: 40 minutes Critical care time was exclusive of separately billable procedures and treating other patients and/or supervising APPs/Residents/Students Critical care was necessary to treat or prevent imminent or life-threatening deterioration. This patient is critically ill and at significant risk for neurological worsening and/or death and care requires constant monitoring. Critical care was time spent  personally by me on the following activities: development of treatment plan with patient and/or surrogate as well as nursing, discussions with consultants, evaluation of patient's response to treatment, examination of patient, obtaining history from patient or surrogate, ordering and performing treatments and interventions, ordering and review of laboratory studies, ordering and review of radiographic studies, pulse oximetry, re-evaluation of patient's condition, participation in multidisciplinary rounds and medical decision  making of high complexity in the care of this patient.

## 2022-05-27 NOTE — ED Notes (Signed)
Patient transported to CT 

## 2022-05-28 DIAGNOSIS — I10 Essential (primary) hypertension: Secondary | ICD-10-CM | POA: Diagnosis not present

## 2022-05-28 DIAGNOSIS — E870 Hyperosmolality and hypernatremia: Secondary | ICD-10-CM

## 2022-05-28 DIAGNOSIS — F17210 Nicotine dependence, cigarettes, uncomplicated: Secondary | ICD-10-CM | POA: Diagnosis not present

## 2022-05-28 DIAGNOSIS — G9341 Metabolic encephalopathy: Secondary | ICD-10-CM

## 2022-05-28 DIAGNOSIS — U071 COVID-19: Secondary | ICD-10-CM

## 2022-05-28 LAB — CBG MONITORING, ED
Glucose-Capillary: 81 mg/dL (ref 70–99)
Glucose-Capillary: 95 mg/dL (ref 70–99)
Glucose-Capillary: 110 mg/dL — ABNORMAL HIGH (ref 70–99)

## 2022-05-28 LAB — BASIC METABOLIC PANEL WITH GFR
Anion gap: 10 (ref 5–15)
BUN: 19 mg/dL (ref 6–20)
CO2: 24 mmol/L (ref 22–32)
Calcium: 9 mg/dL (ref 8.9–10.3)
Chloride: 116 mmol/L — ABNORMAL HIGH (ref 98–111)
Creatinine, Ser: 1.1 mg/dL (ref 0.61–1.24)
GFR, Estimated: >60 mL/min
Glucose, Bld: 105 mg/dL — ABNORMAL HIGH (ref 70–99)
Potassium: 3.6 mmol/L (ref 3.5–5.1)
Sodium: 150 mmol/L — ABNORMAL HIGH (ref 135–145)

## 2022-05-28 LAB — RAPID URINE DRUG SCREEN, HOSP PERFORMED
Amphetamines: NOT DETECTED
Barbiturates: NOT DETECTED
Benzodiazepines: NOT DETECTED
Cocaine: NOT DETECTED
Opiates: NOT DETECTED
Tetrahydrocannabinol: NOT DETECTED

## 2022-05-28 LAB — BASIC METABOLIC PANEL
Anion gap: 9 (ref 5–15)
BUN: 12 mg/dL (ref 6–20)
CO2: 28 mmol/L (ref 22–32)
Calcium: 9.1 mg/dL (ref 8.9–10.3)
Chloride: 115 mmol/L — ABNORMAL HIGH (ref 98–111)
Creatinine, Ser: 1.07 mg/dL (ref 0.61–1.24)
GFR, Estimated: >60 mL/min (ref >60)
Glucose, Bld: 103 mg/dL — ABNORMAL HIGH (ref 70–99)
Potassium: 3.7 mmol/L (ref 3.5–5.1)
Sodium: 152 mmol/L — ABNORMAL HIGH (ref 135–145)

## 2022-05-28 LAB — RESP PANEL BY RT-PCR (RSV, FLU A&B, COVID)  RVPGX2
Influenza A by PCR: NEGATIVE
Influenza B by PCR: NEGATIVE
Resp Syncytial Virus by PCR: NEGATIVE
SARS Coronavirus 2 by RT PCR: POSITIVE — AB

## 2022-05-28 LAB — VITAMIN B12: Vitamin B-12: 1305 pg/mL — ABNORMAL HIGH (ref 180–914)

## 2022-05-28 LAB — TSH: TSH: 1.293 u[IU]/mL (ref 0.350–4.500)

## 2022-05-28 LAB — LACTIC ACID, PLASMA: Lactic Acid, Venous: 1.8 mmol/L (ref 0.5–1.9)

## 2022-05-28 LAB — SODIUM: Sodium: 148 mmol/L — ABNORMAL HIGH (ref 135–145)

## 2022-05-28 LAB — MAGNESIUM: Magnesium: 2 mg/dL (ref 1.7–2.4)

## 2022-05-28 LAB — VALPROIC ACID LEVEL: Valproic Acid Lvl: 36 ug/mL — ABNORMAL LOW (ref 50.0–100.0)

## 2022-05-28 LAB — AMMONIA: Ammonia: 25 umol/L (ref 9–35)

## 2022-05-28 MED ORDER — THIAMINE HCL 100 MG/ML IJ SOLN
100.0000 mg | INTRAMUSCULAR | Status: DC
  Administered 2022-05-28 – 2022-05-29 (×2): 100 mg via INTRAVENOUS
  Filled 2022-05-28 (×2): qty 2

## 2022-05-28 MED ORDER — SODIUM CHLORIDE 0.9 % IV SOLN
150.0000 mg | Freq: Two times a day (BID) | INTRAVENOUS | Status: DC
  Administered 2022-05-28 – 2022-05-29 (×4): 150 mg via INTRAVENOUS
  Filled 2022-05-28 (×5): qty 15

## 2022-05-28 MED ORDER — VALPROATE SODIUM 100 MG/ML IV SOLN
750.0000 mg | Freq: Two times a day (BID) | INTRAVENOUS | Status: DC
  Administered 2022-05-28 – 2022-05-29 (×2): 750 mg via INTRAVENOUS
  Filled 2022-05-28 (×3): qty 7.5

## 2022-05-28 MED ORDER — VALPROATE SODIUM 100 MG/ML IV SOLN
1000.0000 mg | Freq: Once | INTRAVENOUS | Status: AC
  Administered 2022-05-28: 1000 mg via INTRAVENOUS
  Filled 2022-05-28: qty 10

## 2022-05-28 MED ORDER — IRBESARTAN 300 MG PO TABS
300.0000 mg | ORAL_TABLET | Freq: Every day | ORAL | Status: DC
  Administered 2022-05-28 – 2022-06-01 (×5): 300 mg via ORAL
  Filled 2022-05-28 (×5): qty 1

## 2022-05-28 MED ORDER — AMLODIPINE BESYLATE 10 MG PO TABS
10.0000 mg | ORAL_TABLET | Freq: Every day | ORAL | Status: DC
  Administered 2022-05-28 – 2022-06-01 (×5): 10 mg via ORAL
  Filled 2022-05-28: qty 2
  Filled 2022-05-28 (×4): qty 1

## 2022-05-28 MED ORDER — POLYETHYLENE GLYCOL 3350 17 G PO PACK
17.0000 g | PACK | Freq: Two times a day (BID) | ORAL | Status: DC
  Administered 2022-05-28 – 2022-06-01 (×5): 17 g via ORAL
  Filled 2022-05-28 (×6): qty 1

## 2022-05-28 MED ORDER — LACTATED RINGERS IV SOLN: INTRAVENOUS | Status: AC

## 2022-05-28 MED ORDER — ENOXAPARIN SODIUM 100 MG/ML IJ SOSY
100.0000 mg | PREFILLED_SYRINGE | Freq: Two times a day (BID) | INTRAMUSCULAR | Status: DC
  Administered 2022-05-28 – 2022-05-30 (×5): 100 mg via SUBCUTANEOUS
  Filled 2022-05-28 (×6): qty 1

## 2022-05-28 MED ORDER — SENNOSIDES-DOCUSATE SODIUM 8.6-50 MG PO TABS
1.0000 | ORAL_TABLET | Freq: Every day | ORAL | Status: DC
  Administered 2022-05-28: 1 via ORAL
  Filled 2022-05-28: qty 1

## 2022-05-28 MED ORDER — DEXTROSE 5 % IV BOLUS
1000.0000 mL | Freq: Once | INTRAVENOUS | Status: AC
  Administered 2022-05-28: 1000 mL via INTRAVENOUS

## 2022-05-28 MED ORDER — POLYETHYLENE GLYCOL 3350 17 G PO PACK: 17.0000 g | PACK | Freq: Every day | ORAL | Status: DC

## 2022-05-28 MED ORDER — DEXTROSE 5 % IV BOLUS: 1000.0000 mL | Freq: Once | INTRAVENOUS | Status: DC

## 2022-05-28 MED ORDER — LACTATED RINGERS IV BOLUS
1000.0000 mL | Freq: Once | INTRAVENOUS | Status: AC
  Administered 2022-05-28: 1000 mL via INTRAVENOUS
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
## 2022-05-28 NOTE — ED Notes (Signed)
Admitting Team into room, at [facility]. Family at [facility].

## 2022-05-28 NOTE — ED Notes (Signed)
Tried to Get patient blood. I didn't have any success. Nurse was informed.

## 2022-05-28 NOTE — ED Notes (Signed)
SLP at BS 

## 2022-05-28 NOTE — Progress Notes (Addendum)
Subjective:   Patient interviewed at the bedside with wife. Patient follows some commands but is unable to follow all commands. He was repeating several words repeatedly. States that the patient was able to have normal conversations before Monday.  Wife reports severely decreased activity at home since then. Plan with patient and wife to follow-up on the results of his labs and administer IV fluids.   Objective:  Vital signs in last 24 hours: Vitals:   05/28/22 0300 05/28/22 0515 05/28/22 0530 05/28/22 0545  BP: 123/88 (!) 134/93 135/88 (!) 140/101  Pulse: 91 91 83 93  Resp: (!) 21 15 (!) 21 12  Temp:      TempSrc:      SpO2: 96% 90% 94% 91%  Weight:      Height:       Physical Exam: Gen: chronically ill-appearing, somnolent HEENT: Dry mucous membranes, PERRL, EOMI CV: tachycardic, regular rhythm, no murmurs, rubs, or gallops, no LE edema Pulm: normal work of breathing, transmitted upper airway sounds throughout all lung fields Abd: soft, non-distended, normal bowel sounds  Skin: warm, dry, decreased skin turgor  Neuro: Patient following some commands, able to turn his head to verbal stimuli, frequently repeating the same word, unable to assess sensation given lack of purposeful verbal responses  Assessment/Plan:  Principal Problem:   Acute metabolic encephalopathy  Gerald [name] is a 61 y/o male with a pmh of AUD, L ACA infarct, spastic R hemiparesis and aphasia, HTN, and seizures who presented with AMS and was admitted for further evaluation.    #Acute Metabolic encephalopathy  CT head and MRI negative for acute stroke. CXR negative.  Patient is afebrile. Patient positive for COVID.  Hypernatremia at 148. Elevated chloride at 116. Elevated lactic acid of 2.1.  Elevated PT/INR.  No leukocytosis. Blood cultures negative at < 24 hours. Ammonia level WNL. Valproic acid level is low.  Elevated vitamin B12 at 1305. TSH WNL. Urine drug screen negative.  Patient is not on any centrally  acting medications with the exception of antiepileptics. Pending urinalysis. Given patient's severely decreased activity at home, suspect that the patient is severely dehydrated in the setting of COVID infection. Suspect this is contributing to the patient's confusion. -F/u UA -Trend blood cultures -Thiamine '100mg'$  -SLP -PT/OT eval  -RD evaluation  #Seizure  Patient was hospitalized in 07/2021 for seizure with status epilepticus.  Neurology consulted.  EEG demonstrates mild diffuse encephalopathy and cortical dysfunction of the left parasagittal region likely secondary to underlying structural abnormality.  Low suspicion for seizure the cause of his altered mental status at this time. -F/u neurology recommendations -Hold Depakote -Keppra '1500mg'$  IV BID -Lacosamide '150mg'$  IV BID   #Pre-renal AKI #Dehydration #Hypernatremia Baseline creatinine 0.8-1.0.  Creatinine elevated at 1.31 on admission. Patient has dry mucous membranes and decreased skin turgor and dry mucous membranes.  Suspect prerenal.  Will continue with IV fluids. Free water deficit 3.9L, sodium 150. -Trend BMP -1000 mL IV D5 bolus   #Elevated Transaminase #Hyperbilirubinemia Patient has history of alcohol use disorder.  AST and ALT mildly elevated on admission.  Low suspicion for cirrhosis at this time.  Patient could benefit from RUQ ultrasound. -Counsel on alcohol cessation   #Gamma Gap Potentially in the setting of current or prior infection.  HIV negative.  Ordering HCV screening. -F/u HCV   #Hx L ACA CVA c/b spastic R hemiparesis and aphasia - Lovenox injection 100 BID - Hold Xarelto and Atorvastatin   #HTN Takes Amlodipine-olmesartan 10-40 mg at  home.  Patient remains hypertensive this afternoon. - Amlodipine 10 mg daily - Irbesartan 300 mg daily  Diet: Dysphagia 3 Bowel: Miralax VTE: Lovenox IVF: LR Code: FULL  Prior to Admission Living Arrangement: at home with wife Anticipated Discharge Location:  TBD Barriers to Discharge: continued management Dispo: Anticipated discharge in approximately more than 2 day(s).   Gerald Skeans, MD 05/28/2022, 6:38 AM Pager: [phone] After 5pm on weekdays and 1pm on weekends: On Call pager [phone]

## 2022-05-28 NOTE — ED Notes (Signed)
Incontinent care given, linens, and purewick changed. Will continue to monitor

## 2022-05-28 NOTE — Procedures (Signed)
Patient Name: Gerald [name]  MRN: [id number]  Epilepsy Attending: Alric Ran  Referring Physician/Provider:  Date: 05/28/2022 Duration:   Patient history:  61 y.o. 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 EEG to assess for seizure  Level of alertness: lethargic   AEDs during EEG study: Lacosamide, Levetiracetam, Valproate,   Technical aspects: This EEG study was done with scalp electrodes positioned according to the 10-20 International system of electrode placement. Electrical activity was reviewed with band pass filter of 1-'70Hz'$ , sensitivity of 7 uV/mm, display speed of 26m/sec with a '60Hz'$  notched filter applied as appropriate. EEG data were recorded continuously and digitally stored.  Video monitoring was available and reviewed as appropriate.  Description:  EEG showed continuous generalized polymorphic sharply contoured wave up to 6 Hz, theta slowing. There was also intermittent left parasagittal slowing. Hyperventilation and photic stimulation were not performed.      ABNORMALITY - Continuous slow, generalized - Intermittent left parasagittal slowing   IMPRESSION: This study is suggestive of mild diffuse encephalopathy, nonspecific etiology but likely related to sedation, toxic-metabolic etiology. This study is also suggestive of cortical dysfunction arising from left parasagittal region, nonspecific etiology, likely secondary to underlying structural abnormality.   Dr. ARory Percywas notified.    Gerald [name]
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
## 2022-05-28 NOTE — H&P (Incomplete)
Date: 05/27/2022               Patient Name:  Gerald [name] MRN: [id number]  DOB: May 06, 1961 Age / Sex: 61 y.o., male   PCP: Linward Natal, MD         Medical Service: Internal Medicine Teaching Service         Attending Physician: Dr. Langston Masker Carola Rhine, MD    First Contact: Dr. Marland Kitchen Pager: [phone]  Second Contact: Dr. Marland Kitchen Pager: [phone]       After Hours (After 5p/  First Contact Pager: [phone]  weekends / holidays): Second Contact Pager: [phone]   Chief Complaint: ***  History of Present Illness: Patient was evaluated at the bedside with his wife present. He responds in one word answers, primarily yes/no, and also repeats words said by others. Otherwise he is not able to communicate about his current status. Thus, history was obtained through he wife. She says he has had a productive cough since Monday that is new for him. She says he also tends to look at people since this time but will either stare through them or his eyes will deviate upward and he will not track with his eyes. He usually has a good appetite and has not been eating. He also usually smokes cigarettes and drinks beer with the football games which he is no longer doing. His wife says he will sit in front of the TV but has not been attentive to it. He usually can walk with her help but has had complete extremity weakness, and is also unable to feed himself which he can to at baseline. He usually uses a bedside urinal and has been attempting this but urinating on himself. Of note she says he is not urinating more frequently than usual. He has ahd some trouble with mem  Meds: *** No outpatient medications have been marked as taking for the 05/27/22 encounter [facility] Encounter).     Allergies: Allergies as of 05/27/2022  . (No Known Allergies)   Past Medical History:  Diagnosis Date  . Alcohol abuse   . Cocaine abuse ([place]) 2014  . Hypertension   . Infection of wound due to methicillin resistant  Staphylococcus aureus (MRSA)   . Seizures ([place]) 07/2015   had first seizures about 6 months after stroke  . Status epilepticus ([place]) 07/27/2021  . Stroke [facility]) 2014   denies residual on 07/03/2015  . Stroke ([place]) 07/02/2015   "now weak on right side; speech problems" (07/03/2015)  . Tobacco use     Family History: ***  Social History: ***  Review of Systems: A complete ROS was negative except as per HPI. ***  Physical Exam: Blood pressure (!) 136/111, pulse (!) 103, temperature 97.8 F (36.6 C), temperature source Axillary, resp. rate 18, height 6' (1.829 m), weight 109.8 kg, SpO2 93 %. ***  EKG: personally reviewed my interpretation is***  CXR: personally reviewed my interpretation is***  Assessment & Plan by Problem: Active Problems:   * No active hospital problems. * AMS CT head without acute intracranial pathology.MRI negative for acute stroke. No evidence of infection per afebrile, wbc wnl,negative CXR.  Seizure  Hospitalized 07/2021 for seizure with status epilepticus secondary to sleep deprivation and hypomagnesemia. EEG at that time demonstrated electrographic seizures arising from the left hemisphere. Lactate elevated to 2.1. -Keppra -Depakote -lacosamide   AKI Creatinine elevated to 1.31 with baseline of 0.8-1.0. Mildly hypernatremia to 148 consistent with intravascular volume depletion.  

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
## 2022-05-28 NOTE — Progress Notes (Addendum)
Stat EEG with no seizures. Diffuse slowing only.  VPA low at 36.  Ammonia normal.  Will boost with a load of 1000 mg to reach a level of about 80.  Then c/w home dose of 750 BID  -- Amie Portland, MD Neurologist [facility] Pager: [phone]

## 2022-05-28 NOTE — ED Notes (Signed)
Pt alert, verbal, some speech intelliglible, some speech not, remains dysarthria, no changes. Family at [facility]. Pt pulled his male purewick bag off, and his L upper arm IV out.

## 2022-05-28 NOTE — Progress Notes (Signed)
ANTICOAGULATION CONSULT NOTE - Initial Consult  Pharmacy Consult for Lovenox Indication: h/o CVA  No Known Allergies  Patient Measurements: Height: 6' (182.9 cm) Weight: 109.8 kg (242 lb 1 oz) IBW/kg (Calculated) : 77.6  Vital Signs: Temp: 97.8 F (36.6 C) (12/14 2116) Temp Source: Axillary (12/14 2116) BP: 136/111 (12/14 2115) Pulse Rate: 103 (12/14 2115)  Labs: Recent Labs    05/27/22 1740  HGB 16.6  HCT 51.5  PLT 327  LABPROT 19.9*  INR 1.7*  CREATININE 1.31*    Estimated Creatinine Clearance: 77.7 mL/min (A) (by C-G formula based on SCr of 1.31 mg/dL (H)).   Medical History: Past Medical History:  Diagnosis Date   Alcohol abuse    Cocaine abuse ([place]) 2014   Hypertension    Infection of wound due to methicillin resistant Staphylococcus aureus (MRSA)    Seizures ([place]) 07/2015   had first seizures about 6 months after stroke   Status epilepticus ([place]) 07/27/2021   Stroke ([place]) 2014   denies residual on 07/03/2015   Stroke ([place]) 07/02/2015   "now weak on right side; speech problems" (07/03/2015)   Tobacco use     Medications:  No current facility-administered medications on file prior to encounter.   Current Outpatient Medications on File Prior to Encounter  Medication Sig Dispense Refill   acetaminophen (TYLENOL) 500 MG tablet Take 500 mg by mouth every 6 (six) hours as needed (pain).     amLODipine-olmesartan (AZOR) 10-40 MG tablet Take 1 tablet by mouth daily. 30 tablet 2   atorvastatin (LIPITOR) 20 MG tablet Take 1 tablet (20 mg total) by mouth daily at 6 PM. (Patient taking differently: Take 20 mg by mouth daily.) 30 tablet 3   divalproex (DEPAKOTE ER) 500 MG 24 hr tablet Take 3 tablets (1,500 mg total) by mouth at bedtime. 270 tablet 3   Lacosamide 150 MG TABS Take 1 tablet (150 mg total) by mouth 2 (two) times daily. 180 tablet 1   levETIRAcetam (KEPPRA) 750 MG tablet Take 2 tablets (1,500 mg total) by mouth 2 (two) times daily. 360 tablet 3    metoprolol tartrate (LOPRESSOR) 25 MG tablet Take 1 tablet (25 mg total) by mouth 2 (two) times daily. 60 tablet 0   rivaroxaban (XARELTO) 20 MG TABS tablet Take 20 mg by mouth daily with supper.       Assessment: 61 y.o. male admitted with AMS, h/o CVA and Xarelto on hold, for Lovenox.  Last dose of Xarelto taken evening of 12/13 Goal of Therapy:  Anti-Xa level 0.6-1 units/ml 4hrs after LMWH dose given Monitor platelets by anticoagulation protocol: Yes   Plan:  Lovenox 100 mg SQ q12h  Caryl Pina 05/28/2022,1:11 AM

## 2022-05-29 DIAGNOSIS — U071 COVID-19: Secondary | ICD-10-CM

## 2022-05-29 DIAGNOSIS — F17210 Nicotine dependence, cigarettes, uncomplicated: Secondary | ICD-10-CM | POA: Diagnosis not present

## 2022-05-29 DIAGNOSIS — E870 Hyperosmolality and hypernatremia: Secondary | ICD-10-CM | POA: Diagnosis not present

## 2022-05-29 DIAGNOSIS — R4182 Altered mental status, unspecified: Secondary | ICD-10-CM

## 2022-05-29 DIAGNOSIS — G9341 Metabolic encephalopathy: Secondary | ICD-10-CM | POA: Diagnosis not present

## 2022-05-29 LAB — COMPREHENSIVE METABOLIC PANEL
ALT: 28 U/L (ref 0–44)
AST: 49 U/L — ABNORMAL HIGH (ref 15–41)
Albumin: 2.6 g/dL — ABNORMAL LOW (ref 3.5–5.0)
Alkaline Phosphatase: 44 U/L (ref 38–126)
Anion gap: 8 (ref 5–15)
BUN: 9 mg/dL (ref 6–20)
CO2: 26 mmol/L (ref 22–32)
Calcium: 8.8 mg/dL — ABNORMAL LOW (ref 8.9–10.3)
Chloride: 113 mmol/L — ABNORMAL HIGH (ref 98–111)
Creatinine, Ser: 0.86 mg/dL (ref 0.61–1.24)
GFR, Estimated: >60 mL/min (ref >60)
Glucose, Bld: 99 mg/dL (ref 70–99)
Potassium: 4 mmol/L (ref 3.5–5.1)
Sodium: 147 mmol/L — ABNORMAL HIGH (ref 135–145)
Total Bilirubin: 1.2 mg/dL (ref 0.3–1.2)
Total Protein: 7 g/dL (ref 6.5–8.1)

## 2022-05-29 LAB — CBC
HCT: 45.3 % (ref 39.0–52.0)
Hemoglobin: 14.7 g/dL (ref 13.0–17.0)
MCH: 31.6 pg (ref 26.0–34.0)
MCHC: 32.5 g/dL (ref 30.0–36.0)
MCV: 97.4 fL (ref 80.0–100.0)
Platelets: 227 10*3/uL (ref 150–400)
RBC: 4.65 MIL/uL (ref 4.22–5.81)
RDW: 13.6 % (ref 11.5–15.5)
WBC: 6.5 10*3/uL (ref 4.0–10.5)
nRBC: 0 % (ref 0.0–0.2)

## 2022-05-29 LAB — GLUCOSE, CAPILLARY
Glucose-Capillary: 102 mg/dL — ABNORMAL HIGH (ref 70–99)
Glucose-Capillary: 112 mg/dL — ABNORMAL HIGH (ref 70–99)
Glucose-Capillary: 118 mg/dL — ABNORMAL HIGH (ref 70–99)
Glucose-Capillary: 85 mg/dL (ref 70–99)
Glucose-Capillary: 92 mg/dL (ref 70–99)
Glucose-Capillary: 98 mg/dL (ref 70–99)

## 2022-05-29 LAB — SODIUM
Sodium: 148 mmol/L — ABNORMAL HIGH (ref 135–145)
Sodium: 149 mmol/L — ABNORMAL HIGH (ref 135–145)

## 2022-05-29 MED ORDER — SENNOSIDES-DOCUSATE SODIUM 8.6-50 MG PO TABS
2.0000 | ORAL_TABLET | Freq: Every day | ORAL | Status: DC
  Administered 2022-05-30 – 2022-05-31 (×2): 2 via ORAL
  Filled 2022-05-29 (×3): qty 2

## 2022-05-29 MED ORDER — VALPROATE SODIUM 100 MG/ML IV SOLN
750.0000 mg | Freq: Three times a day (TID) | INTRAVENOUS | Status: DC
  Filled 2022-05-29: qty 7.5

## 2022-05-29 MED ORDER — ADULT MULTIVITAMIN W/MINERALS CH
1.0000 | ORAL_TABLET | Freq: Every day | ORAL | Status: DC
  Administered 2022-05-29 – 2022-06-01 (×4): 1 via ORAL
  Filled 2022-05-29 (×5): qty 1

## 2022-05-29 MED ORDER — DEXTROSE-NACL 5-0.45 % IV SOLN: INTRAVENOUS | Status: AC

## 2022-05-29 MED ORDER — ENSURE ENLIVE PO LIQD
237.0000 mL | Freq: Two times a day (BID) | ORAL | Status: DC
  Administered 2022-05-29 – 2022-06-01 (×7): 237 mL via ORAL

## 2022-05-29 MED ORDER — DIVALPROEX SODIUM 500 MG PO DR TAB
750.0000 mg | DELAYED_RELEASE_TABLET | Freq: Three times a day (TID) | ORAL | Status: DC
  Administered 2022-05-29 – 2022-06-01 (×10): 750 mg via ORAL
  Filled 2022-05-29 (×11): qty 1

## 2022-05-29 MED ORDER — LEVETIRACETAM 750 MG PO TABS
1500.0000 mg | ORAL_TABLET | Freq: Two times a day (BID) | ORAL | Status: DC
  Administered 2022-05-29 – 2022-06-01 (×6): 1500 mg via ORAL
  Filled 2022-05-29 (×6): qty 2

## 2022-05-29 MED ORDER — THIAMINE MONONITRATE 100 MG PO TABS
100.0000 mg | ORAL_TABLET | Freq: Every day | ORAL | Status: DC
  Administered 2022-05-29 – 2022-05-31 (×3): 100 mg via ORAL
  Filled 2022-05-29 (×3): qty 1

## 2022-05-29 MED ORDER — LACOSAMIDE 50 MG PO TABS
50.0000 mg | ORAL_TABLET | Freq: Two times a day (BID) | ORAL | Status: DC
  Administered 2022-05-29 – 2022-06-01 (×6): 50 mg via ORAL
  Filled 2022-05-29 (×6): qty 1
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
## 2022-05-29 NOTE — Evaluation (Addendum)
Occupational Therapy Evaluation Patient Details Name: Gerald [name] MRN: [id number] DOB: 1961/05/13 Today's Date: 05/29/2022   History of Present Illness 61 y.o. 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    MRI Brain: No acute intracranial abnormality.  2. 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   Clinical Impression   Patient admitted for the diagnosis above.  PTA, he lives with his spouse in an apartment, where she has been providing increasing assist at home over the past two weeks.  Spouse states, she could help him stand pivot to/from wheelchair, and he could feed himself.  Recently, he has been unable to feed himself, and needs +2 to transfer.  Deficits are listed below.  OT will follow in the acute setting to increase transfer status and improve self feeding ability.  SNF is recommended currently, but the hope is with acute rehab, he can improve enough to transition home.  Spouse would like him to return to one assist transfers.         Recommendations for follow up therapy are one component of a multi-disciplinary discharge planning process, led by the attending physician.  Recommendations may be updated based on patient status, additional functional criteria and insurance authorization.   Follow Up Recommendations  Skilled nursing-short term rehab (<3 hours/day)     Assistance Recommended at Discharge Frequent or constant Supervision/Assistance  Patient can return home with the following Help with stairs or ramp for entrance;Assistance with feeding;Direct supervision/assist for financial management;Direct supervision/assist for medications management;A lot of help with bathing/dressing/bathroom;Two people to help with walking and/or transfers    Functional  Status Assessment  Patient has had a recent decline in their functional status and demonstrates the ability to make significant improvements in function in a reasonable and predictable amount of time.  Equipment Recommendations  None recommended by OT    Recommendations for Other Services       Precautions / Restrictions Precautions Precautions: Fall Precaution Comments: Covid+ Restrictions Weight Bearing Restrictions: No Other Position/Activity Restrictions: R hemiparesis      Mobility Bed Mobility Overal bed mobility: Needs Assistance Bed Mobility: Supine to Sit, Sit to Supine     Supine to sit: Total assist Sit to supine: Total assist   General bed mobility comments: very little ability to use L leg, but able to help minimally with L arm    Transfers                   General transfer comment: Deferred      Balance Overall balance assessment: Needs assistance Sitting-balance support: Feet supported, Single extremity supported Sitting balance-Leahy Scale: Poor   Postural control: Right lateral lean                                 ADL either performed or assessed with clinical judgement   ADL Overall ADL's : Needs assistance/impaired Eating/Feeding: Maximal assistance;Bed level   Grooming: Maximal assistance;Bed level   Upper Body Bathing: Maximal assistance;Bed level   Lower Body Bathing: Total assistance;Bed level   Upper Body Dressing : Maximal assistance;Bed level   Lower Body Dressing: Total assistance;Bed level       Toileting- Clothing Manipulation and Hygiene: Total assistance;Bed level  Vision   Vision Assessment?: No apparent visual deficits Additional Comments: Patient tracking well, and able to see date and TV.     Perception     Praxis      Pertinent Vitals/Pain Pain Assessment Pain Assessment: Faces Pain Score: 0-No pain Pain Intervention(s): Monitored during session     Hand  Dominance Right   Extremity/Trunk Assessment Upper Extremity Assessment Upper Extremity Assessment: RUE deficits/detail RUE Deficits / Details: Flaccid with no AROM noted. RUE Sensation: decreased light touch RUE Coordination: decreased fine motor;decreased gross motor   Lower Extremity Assessment Lower Extremity Assessment: Defer to PT evaluation   Cervical / Trunk Assessment Cervical / Trunk Assessment: Kyphotic   Communication Communication Communication: Expressive difficulties   Cognition Arousal/Alertness: Awake/alert Behavior During Therapy: WFL for tasks assessed/performed Overall Cognitive Status: History of cognitive impairments - at baseline                                       General Comments   VSS on RA    Exercises     Shoulder Instructions      Home Living Family/patient expects to be discharged to:: Private residence Living Arrangements: Spouse/significant other Available Help at Discharge: Family;Available 24 hours/day Type of Home: Apartment Home Access: Ramped entrance     Home Layout: One level     Bathroom Shower/Tub: Teacher, early years/pre: Handicapped height Bathroom Accessibility: Yes How Accessible: Accessible via walker Home Equipment: Conservation officer, nature (2 wheels);BSC/3in1;Wheelchair - manual      Lives With: Spouse    Prior Functioning/Environment Prior Level of Function : Needs assist             Mobility Comments: Needs +2 assist to get from bed to [facility] as of late. ADLs Comments: Spouse reports that pt requiring max assist on all ADL's recently including self feeding.        OT Problem List: Decreased strength;Decreased range of motion;Decreased activity tolerance;Impaired balance (sitting and/or standing);Impaired UE functional use      OT Treatment/Interventions: Self-care/ADL training;Therapeutic activities;Cognitive remediation/compensation;Patient/family education;Balance training;DME and/or  AE instruction    OT Goals(Current goals can be found in the care plan section) Acute Rehab OT Goals Patient Stated Goal: Be able to move OT Goal Formulation: With patient Time For Goal Achievement: 06/11/22 Potential to Achieve Goals: Good ADL Goals Pt Will Perform Eating: with set-up;sitting Pt Will Perform Grooming: with set-up;sitting Pt Will Perform Upper Body Dressing: with min assist;sitting Pt Will Transfer to Toilet: with min assist;stand pivot transfer;bedside commode  OT Frequency: Min 2X/week    Co-evaluation              AM-PAC OT "6 Clicks" Daily Activity     Outcome Measure Help from another person eating meals?: A Lot Help from another person taking care of personal grooming?: A Lot Help from another person toileting, which includes using toliet, bedpan, or urinal?: Total Help from another person bathing (including washing, rinsing, drying)?: A Lot Help from another person to put on and taking off regular upper body clothing?: A Lot Help from another person to put on and taking off regular lower body clothing?: Total 6 Click Score: 10   End of Session Nurse Communication: Need for lift equipment  Activity Tolerance: Patient tolerated treatment well Patient left: in bed;with nursing/sitter in room;with family/visitor present;with call bell/phone within reach  OT Visit Diagnosis: Muscle weakness (generalized) (M62.81)  Time: 2820-6015 OT Time Calculation (min): 25 min Charges:  OT General Charges $OT Visit: 1 Visit OT Evaluation $OT Eval Moderate Complexity: 1 Mod OT Treatments $Self Care/Home Management : 8-22 mins  05/29/2022  RP, OTR/L  Acute Rehabilitation Services  Office:  [phone]   Metta Clines 05/29/2022, 9:33 AM

## 2022-05-29 NOTE — Progress Notes (Addendum)
NEUROLOGY PROGRESS NOTE   Date of service: May 29, 2022 Patient Name: Gerald [name] MRN:  [id number] DOB:  06-22-1960  Brief HPI  Gerald [name] is a 61 y.o. 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 Sister reports that he has been having some respiratory issues of coughing and breathing difficulty since Monday and since then has been having a difficult time talking.  He only speaks short sentences or keeps repeating himself. Sister also reports that he had trouble seeing and it seemed like he could not see properly. No apparent seizures noted by family. Patient has residual right sided weakness from prior stroke and is mostly bed-bound at home.   Interval Hx   Patient has tested positive for COVID. Spot EEG negative for seizures, shows mild diffuse encephalopathy. No witnessed seizures. Confirmed with family that patient is compliant with his seizure meds at home, and there were no missed doses prior to this admission. VPA level low at 38. Dose increased to '750mg'$  TID today.   Patient oriented to self and was able to tell me his birthday. Follows simple commands.   Vitals   Vitals:   05/29/22 0209 05/29/22 0738 05/29/22 0927 05/29/22 0939  BP: (!) 158/99 (!) 184/102  (!) 151/98  Pulse:  87  (!) 101  Resp:  17  15  Temp: 97.9 F (36.6 C)  (!) 97 F (36.1 C) (!) 97 F (36.1 C)  TempSrc: Oral   Oral  SpO2:  95%  92%  Weight:      Height:         Body mass index is 32.83 kg/m.  Physical Exam   General: Ill-appearing male, lying in bed. No acute distress. HENT: Normal external appearance of ears and nose. Several missing teeth, dry mucosa. Neck: Supple, no pain or tenderness  CV: No JVD. No peripheral edema.  Pulmonary: Normal  respiratory effort.  Abdomen: Soft to touch, non-tender.  Ext: No cyanosis, edema, or deformity  Skin: No rash or open wounds. Normal palpation of skin.   Musculoskeletal:  Pale nailbeds. No clubbing.   Neurologic Examination  Mental status/Cognition: Alert, oriented to self and his birthday. Answered orientation questions with, "I dont know where I am". Patient would also start laughing in response to further questions. Follows simple commands  Speech/language: Able to repeat single words and name simple objects for a couple of questions, then attention diminishes.  Cranial nerves: Pupils are equal and reactive. EOM intact with unimpaired tracking seen throughout assessment. Visual fields intact, except for inconsistent responses in right upper visual field test. Blinks to threat in all fields. Face symmetric. Hearing intact. Symmetric head turn and shoulder shrug. Midline tongue protrusion.  Motor: 5/5 strength on left side. Residual right-side weakness present, with RUE 2/5. Left hand tremor seen during FNT.  Sensation: Intact to light touch bilaterally Coordination/Complex Motor: Finger to Nose: Slow, but accurate. Done with Left hand only. Tremors seen during this activity.  Gait: Deferred  Labs   Basic Metabolic Panel:  Lab Results  Component Value Date   NA 148 (H) 05/29/2022   K 3.7 05/28/2022   CO2 28 05/28/2022   GLUCOSE 103 (H) 05/28/2022   BUN 12 05/28/2022   CREATININE 1.07 05/28/2022   CALCIUM 9.1 05/28/2022   GFRNONAA >60 05/28/2022  GFRAA >60 05/22/2016   HbA1c:  Lab Results  Component Value Date   HGBA1C 5.1 07/28/2021   LDL:  Lab Results  Component Value Date   LDLCALC 47 07/28/2021   Urine Drug Screen:     Component Value Date/Time   LABOPIA NONE DETECTED 05/28/2022 0209   COCAINSCRNUR NONE DETECTED 05/28/2022 0209   COCAINSCRNUR Negative 07/27/2021 1052   LABBENZ NONE DETECTED 05/28/2022 0209   AMPHETMU NONE DETECTED 05/28/2022 0209   THCU NONE  DETECTED 05/28/2022 0209   LABBARB NONE DETECTED 05/28/2022 0209    Alcohol Level     Component Value Date/Time   ETH <10 07/27/2021 0609   No results found for: "PHENYTOIN", "ZONISAMIDE", "LAMOTRIGINE", "LEVETIRACETA" Lab Results  Component Value Date   VALPROATE 36 (L) 05/28/2022    Imaging and Diagnostic studies  Results for orders placed during the hospital encounter of 05/27/22  MR BRAIN WO CONTRAST 1. No acute intracranial abnormality. 2. 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    Assessment  Gerald [name] is a 61 y.o. male with PMH significant for alcohol use disorder, L ACA infarct, spastic R hemiparesis and aphasia, HTN, seizures. Presented for AMS and admitted 12/14. COVID Positive. Spot EEG negative. No witnessed seizures.   - Overall, his neuro exam is improving. - Family at bedside states that today he is at or better than his usual baseline   - MRI brain: No acute intracranial abnormality. 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  - 12/15 EEG: This study is suggestive of mild diffuse encephalopathy, nonspecific to etiology but likely related to sedation, toxic-metabolic etiology. This study is also suggestive of cortical dysfunction arising from left parasagittal region, nonspecific etiology, likely secondary to underlying structural abnormality.  Recommendations  Depakote has been increased to 750 mg TID due to subtherapeutic level of 36 Continue Keppra 1500 mg BID Continue Vimpat 50 mg BID COVID management per primary team  Neurohospitalist team will sign off. Please call if there are additional questions.  Outpatient Neurology follow up.   ______________________________________________________________________   Otelia Santee, DNP, AGACNP-BC [facility] Please see AMION for pager  Electronically signed: Dr. Kerney Elbe

## 2022-05-29 NOTE — Progress Notes (Signed)
HD#2 Subjective:  Overnight Events: None, remains afebrile  Evaluated at bedside this morning.  His [name] is present.  She reports that he is doing a little bit better than the day prior, but continues to not talk as much as at baseline.  He was able to state first last name and his date of birth, he is unsure of where he is currently and what brought him into the hospital.  He does report that he feels ready for breakfast. Gerald [name] states that he drank some water earlier this morning and did well with that.  Patient denies urinary symptoms.  Pt is updated on the plan for today, and all questions and concerns are addressed.   Objective:  Vital signs in last 24 hours: Vitals:   05/29/22 0209 05/29/22 0738 05/29/22 0927 05/29/22 0939  BP: (!) 158/99 (!) 184/102  (!) 151/98  Pulse:  87  (!) 101  Resp:  17  15  Temp: 97.9 F (36.6 C)  (!) 97 F (36.1 C) (!) 97 F (36.1 C)  TempSrc: Oral   Oral  SpO2:  95%  92%  Weight:      Height:       Supplemental O2: Room Air SpO2: 92 % O2 Flow Rate (L/min): 2 L/min FiO2 (%): 100 %   Physical Exam:  Constitutional: Laying in bed, appears comfortable Cardiovascular: regular rate and rhythm, no m/r/g Pulmonary/Chest: normal work of breathing on room air, transmitted upper airway sounds throughout lung fields Abdominal: soft, non-tender, non-distended Neurological: able to follow commands, answers questions appropriately, alert and oriented to self and time, but not place, spontaneously moves left upper and lower extremity, paresis of right extremities Skin: warm and dry  Filed Weights   05/27/22 1641  Weight: 109.8 kg     Intake/Output Summary (Last 24 hours) at 05/29/2022 1207 Last data filed at 05/28/2022 2345 Gross per 24 hour  Intake 1990.3 ml  Output --  Net 1990.3 ml   Net IO Since Admission: 3,416.63 mL [05/29/22 1207]  Pertinent Labs:    Latest Ref Rng & Units 05/29/2022   10:40 AM 05/27/2022    5:40 PM  10/08/2021   12:20 PM  CBC  WBC 4.0 - 10.5 K/uL 6.5  8.0  6.0   Hemoglobin 13.0 - 17.0 g/dL 14.7  16.6  14.2   Hematocrit 39.0 - 52.0 % 45.3  51.5  43.3   Platelets 150 - 400 K/uL 227  327  215        Latest Ref Rng & Units 05/29/2022    4:13 AM 05/28/2022   11:35 PM 05/28/2022    8:10 PM  CMP  Sodium 135 - 145 mmol/L 148  149  148     Imaging: No results found.  Assessment/Plan:   Principal Problem:   Acute metabolic encephalopathy Active Problems:   COVID-19   Hypernatremia   Patient Summary: Gerald [name] is a 61 y.o. with a pertinent PMH of alcohol use disorder, left ACA infarct, spastic right hemiparesis and aphasia, hypertension and seizures, who presented with mental status and admitted on 12/14 for acute metabolic encephalopathy on HD#2.   Acute metabolic encephalopathy- improving COVID + Patient with history of left ACA stroke with residual deficits.  He was found to be COVID-positive after not feeling well for several days with decreased p.o. intake.  Blood cultures negative at 48 hours, no leukocytosis.  Urine drug screen negative. Today he is more talkative and his [name] agrees  that he seems to be doing some better.  He was evaluated by speech therapy yesterday with recommendations for dysphagia 3.  He will need to work with physical and occupational therapy as his function has declined over the last week. -follow-up on Blood cultures -Thiamine 100 mg -PT/OT -RD evaluation  Hypernatremia AKI- prerenal resolved Sodium improved from 152-147.  He is now able to eat and drink.  Will supplement with 1 L of D5 half-normal saline and repeat labs tomorrow morning. -d5 1/2 NS 125 mL for 4 hrs. -Repeat CMP tomorrow  History of Seizure EEG demonstrated mild diffuse encephalopathy secondary to underlying structural or wall abnormality with low suspicion for seizure.  He is now able to take p.o. meds so will switch IV meds to PO. -Neuro following, appreciate  recommendations -Vimpat 150 mg BID -Keppra 1500 mg BID -Depakote 750 mg TID  Elevated transaminase Hyperbilirubinemia Fib-4 score at 1.45. AST improved from 78 to 49.  Will continue to trend CMP. -CMP  Gamma gap Total protein 7 with albumin 2.6 with gap of 4.4. HCV pending.  -Follow-up HCV  Hx of L ACA CVA c/b spastic R hemiparesis and aphasia Patient is able to transfer to wheelchair.  He will work with PT/OT.  Current recommendations indicate he may require acute stay at rehab to regain some mobility prior to returning home. -PT/OT  Constipation He has not had a bowel movement in over 5 days.  -Increase senna to 2 tabs nightly -MiraLAX twice daily  HTN Pressures remain hypertensive into the 150s/ 80s. -Continue amlodipine 10 mg and irbesartan 300 mg daily  Diet: Dysphagia 3 IVF: 1/2 NS,125cc/hr for 4 hours VTE: Enoxaparin Code: Full PT/OT recs: Pending TOC recs: pending OT/ PT recs Family Update: Gerald [name], wife updated at bedside   Dispo: Anticipated discharge to SNF in 2-3 days pending PT/ OT.   Gerald [name], D.O.  Internal Medicine Resident, PGY-2 [facility]  Pager: [phone] 12:07 PM, 05/29/2022   **Please contact the on call pager after 5 pm and on weekends at [phone].**

## 2022-05-29 NOTE — Progress Notes (Signed)
Initial Nutrition Assessment  DOCUMENTATION CODES:   Obesity unspecified  INTERVENTION:   -Ensure Enlive po BID, each supplement provides 350 kcal and 20 grams of protein -MVI with minerals daily -Obtain new wt  NUTRITION DIAGNOSIS:   Increased nutrient needs related to acute illness (COVID-19) as evidenced by estimated needs.  GOAL:   Patient will meet greater than or equal to 90% of their needs  MONITOR:   PO intake, Supplement acceptance  REASON FOR ASSESSMENT:   Consult Assessment of nutrition requirement/status  ASSESSMENT:   Pt with a pmh of AUD, L ACA infarct,spastic R hemiparesis and aphasia, HTN, seizures who presents with AMS.  Pt admitted with acute metabolic encephalopathy.   12/15- EEG WDL, s/p BSE- dysphagia 3 diet with thin liquids  Pt unavailable at time of visit. Attempted to speak with pt via call to hospital room phone, however, unable to reach. RD unable to obtain further nutrition-related history or complete nutrition-focused physical exam at this time.    Pt currently on a dysphagia 3 diet with thin liquids. No meal completion data available to assess at this time. Per H&P, pt has a good appetite PTA.   Reviewed wt hx; wt appears stable over the past 10 months, but unsure if currently wt is a stated wt or actual wt. RD will order another wt to better assess wt changes.   Medications reviewed and include keppra, miralax, senokot, thiamine, and dextrose 5%-0.45% sodium chloride infusion @ 125 ml/hr.   Labs reviewed: Na: 147, CBGS: 102.    Diet Order:   Diet Order             DIET DYS 3 Room service appropriate? Yes; Fluid consistency: Thin  Diet effective now                   EDUCATION NEEDS:   No education needs have been identified at this time  Skin:  Skin Assessment: Reviewed RN Assessment  Last BM:  05/28/22  Height:   Ht Readings from Last 1 Encounters:  05/27/22 6' (1.829 m)    Weight:   Wt Readings from Last 1  Encounters:  05/27/22 109.8 kg    Ideal Body Weight:  80.9 kg  BMI:  Body mass index is 32.83 kg/m.  Estimated Nutritional Needs:   Kcal:  2200-2400  Protein:  120-135 grams  Fluid:  > 2 L    Loistine Chance, RD, LDN, [place] Registered Dietitian II Certified Diabetes Care and Education Specialist Please refer to [facility] for RD and/or RD on-call/weekend/after hours pager

## 2022-05-29 NOTE — Progress Notes (Signed)
Lab tried to draw patients's labs twice and was unsuccessful.

## 2022-05-29 NOTE — Evaluation (Signed)
Physical Therapy Evaluation Patient Details Name: Gerald [name] MRN: [id number] DOB: 1960/11/27 Today's Date: 05/29/2022  History of Present Illness  61 y.o. 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    MRI Brain: No acute intracranial abnormality.  2. 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   Clinical Impression  Pt admitted with above diagnosis. PTA pt lived at home with wife in first floor apt. Assist with transfers and ADLs, mod I w/c mobility in apt. Pt currently with functional limitations due to the deficits listed below (see PT Problem List). On eval, he required max assist rolling, and total assist supine to sit. Deficits noted in balance, strength, and mobility tolerance. R hemiparesis from previous CVA. VSS on 2L. Pt will benefit from skilled PT to increase their independence and safety with mobility to allow discharge to the venue listed below.          Recommendations for follow up therapy are one component of a multi-disciplinary discharge planning process, led by the attending physician.  Recommendations may be updated based on patient status, additional functional criteria and insurance authorization.  Follow Up Recommendations Skilled nursing-short term rehab (<3 hours/day) Can patient physically be transported by private vehicle: No    Assistance Recommended at Discharge Frequent or constant Supervision/Assistance  Patient can return home with the following  Two people to help with walking and/or transfers;Two people to help with bathing/dressing/bathroom;Assist for transportation;Help with stairs or ramp for entrance    Equipment Recommendations None recommended by PT  Recommendations for Other Services       Functional Status Assessment Patient  has had a recent decline in their functional status and demonstrates the ability to make significant improvements in function in a reasonable and predictable amount of time.     Precautions / Restrictions Precautions Precautions: Fall Precaution Comments: Covid+ Restrictions Other Position/Activity Restrictions: R hemiparesis      Mobility  Bed Mobility Overal bed mobility: Needs Assistance Bed Mobility: Rolling, Supine to Sit, Sit to Supine Rolling: Max assist   Supine to sit: Total assist Sit to supine: Total assist   General bed mobility comments: rolling R/L for pericare, bed linen change, pushing into trunk extension during transition to EOB    Transfers                   General transfer comment: unable to progress beyond EOB    Ambulation/Gait                  Stairs            Wheelchair Mobility    Modified Rankin (Stroke Patients Only)       Balance Overall balance assessment: Needs assistance Sitting-balance support: Feet supported, Single extremity supported Sitting balance-Leahy Scale: Poor Sitting balance - Comments: min/mod assist to maintain sitting balance EOB Postural control: Posterior lean, Right lateral lean                                   Pertinent Vitals/Pain Pain Assessment Pain Assessment: Faces Faces Pain Scale: No hurt    Home Living Family/patient expects to be discharged to:: Private residence Living Arrangements: Spouse/significant other Available Help at Discharge: Family;Available 24 hours/day Type of Home: Apartment Home Access:  Ramped entrance       Home Layout: One level Home Equipment: Conservation officer, nature (2 wheels);BSC/3in1;Wheelchair - manual      Prior Function Prior Level of Function : Needs assist             Mobility Comments: assist with transfers to/from w/c. Able to self propel in apartment using UE/LEs       Hand Dominance   Dominant Hand: Right     Extremity/Trunk Assessment   Upper Extremity Assessment Upper Extremity Assessment: Defer to OT evaluation    Lower Extremity Assessment Lower Extremity Assessment: RLE deficits/detail;Generalized weakness RLE Deficits / Details: previous CVA with R residual weakness       Communication   Communication: Expressive difficulties  Cognition Arousal/Alertness: Awake/alert Behavior During Therapy: WFL for tasks assessed/performed Overall Cognitive Status: History of cognitive impairments - at baseline                                 General Comments: primarily answers with 'ok' or 'yeah.'        General Comments General comments (skin integrity, edema, etc.): VSS on 2L    Exercises     Assessment/Plan    PT Assessment Patient needs continued PT services  PT Problem List Decreased strength;Decreased balance;Decreased mobility;Cardiopulmonary status limiting activity;Decreased activity tolerance       PT Treatment Interventions Functional mobility training;Balance training;Patient/family education;Therapeutic activities;Therapeutic exercise    PT Goals (Current goals can be found in the Care Plan section)  Acute Rehab PT Goals Patient Stated Goal: home per wife PT Goal Formulation: With patient/family Time For Goal Achievement: 06/12/22 Potential to Achieve Goals: Good    Frequency Min 2X/week     Co-evaluation               AM-PAC PT "6 Clicks" Mobility  Outcome Measure Help needed turning from your back to your side while in a flat bed without using bedrails?: A Lot Help needed moving from lying on your back to sitting on the side of a flat bed without using bedrails?: Total Help needed moving to and from a bed to a chair (including a wheelchair)?: Total Help needed standing up from a chair using your arms (e.g., wheelchair or bedside chair)?: Total Help needed to walk in hospital room?: Total Help needed climbing 3-5 steps with a railing? :  Total 6 Click Score: 7    End of Session   Activity Tolerance: Patient tolerated treatment well Patient left: in bed;with call bell/phone within reach;with bed alarm set;with family/visitor present Nurse Communication: Mobility status PT Visit Diagnosis: Other abnormalities of gait and mobility (R26.89)    Time: 2956-2130 PT Time Calculation (min) (ACUTE ONLY): 26 min   Charges:   PT Evaluation $PT Eval Moderate Complexity: 1 Mod PT Treatments $Therapeutic Activity: 8-22 mins        Lorrin Goodell, PT  Office # [phone] Pager [phone]   Lorriane Shire 05/29/2022, 3:12 PM

## 2022-05-30 DIAGNOSIS — E876 Hypokalemia: Secondary | ICD-10-CM

## 2022-05-30 DIAGNOSIS — U071 COVID-19: Secondary | ICD-10-CM | POA: Diagnosis not present

## 2022-05-30 DIAGNOSIS — F17210 Nicotine dependence, cigarettes, uncomplicated: Secondary | ICD-10-CM | POA: Diagnosis not present

## 2022-05-30 DIAGNOSIS — G9341 Metabolic encephalopathy: Secondary | ICD-10-CM | POA: Diagnosis not present

## 2022-05-30 LAB — COMPREHENSIVE METABOLIC PANEL
ALT: 27 U/L (ref 0–44)
AST: 45 U/L — ABNORMAL HIGH (ref 15–41)
Albumin: 2.2 g/dL — ABNORMAL LOW (ref 3.5–5.0)
Alkaline Phosphatase: 41 U/L (ref 38–126)
Anion gap: 7 (ref 5–15)
BUN: 6 mg/dL (ref 6–20)
CO2: 28 mmol/L (ref 22–32)
Calcium: 8.5 mg/dL — ABNORMAL LOW (ref 8.9–10.3)
Chloride: 109 mmol/L (ref 98–111)
Creatinine, Ser: 0.7 mg/dL (ref 0.61–1.24)
GFR, Estimated: >60 mL/min (ref >60)
Glucose, Bld: 113 mg/dL — ABNORMAL HIGH (ref 70–99)
Potassium: 2.9 mmol/L — ABNORMAL LOW (ref 3.5–5.1)
Sodium: 144 mmol/L (ref 135–145)
Total Bilirubin: 0.8 mg/dL (ref 0.3–1.2)
Total Protein: 6 g/dL — ABNORMAL LOW (ref 6.5–8.1)

## 2022-05-30 LAB — CBC
HCT: 39.6 % (ref 39.0–52.0)
Hemoglobin: 13.2 g/dL (ref 13.0–17.0)
MCH: 32.1 pg (ref 26.0–34.0)
MCHC: 33.3 g/dL (ref 30.0–36.0)
MCV: 96.4 fL (ref 80.0–100.0)
Platelets: 218 10*3/uL (ref 150–400)
RBC: 4.11 MIL/uL — ABNORMAL LOW (ref 4.22–5.81)
RDW: 13.5 % (ref 11.5–15.5)
WBC: 6.1 10*3/uL (ref 4.0–10.5)
nRBC: 0 % (ref 0.0–0.2)

## 2022-05-30 LAB — GLUCOSE, CAPILLARY
Glucose-Capillary: 104 mg/dL — ABNORMAL HIGH (ref 70–99)
Glucose-Capillary: 94 mg/dL (ref 70–99)
Glucose-Capillary: 95 mg/dL (ref 70–99)
Glucose-Capillary: 86 mg/dL (ref 70–99)
Glucose-Capillary: 93 mg/dL (ref 70–99)

## 2022-05-30 LAB — HCV AB W REFLEX TO QUANT PCR: HCV Ab: NONREACTIVE

## 2022-05-30 LAB — MAGNESIUM: Magnesium: 1.7 mg/dL (ref 1.7–2.4)

## 2022-05-30 LAB — HCV INTERPRETATION

## 2022-05-30 MED ORDER — POTASSIUM CHLORIDE CRYS ER 20 MEQ PO TBCR
40.0000 meq | EXTENDED_RELEASE_TABLET | Freq: Two times a day (BID) | ORAL | Status: AC
  Administered 2022-05-30 (×2): 40 meq via ORAL
  Filled 2022-05-30 (×2): qty 2

## 2022-05-30 MED ORDER — MAGNESIUM SULFATE 2 GM/50ML IV SOLN
2.0000 g | Freq: Once | INTRAVENOUS | Status: AC
  Administered 2022-05-30: 2 g via INTRAVENOUS
  Filled 2022-05-30: qty 50

## 2022-05-30 MED ORDER — RIVAROXABAN 20 MG PO TABS
20.0000 mg | ORAL_TABLET | Freq: Every day | ORAL | Status: DC
  Administered 2022-05-30: 20 mg via ORAL
  Filled 2022-05-30 (×3): qty 1

## 2022-05-30 NOTE — Progress Notes (Signed)
Multiple calls to pt's wife with no answer, voicemail left requesting return call re SNF placement. SNF search started. Will need to f/u with pt's wife to confirm dc plan.   Wandra Feinstein, MSW, LCSW [phone] (coverage)

## 2022-05-30 NOTE — TOC Initial Note (Signed)
Transition of Care [facility]) - Initial/Assessment Note    Patient Details  Name: Gerald [name] MRN: [id number] Date of Birth: 09-12-1960  [facility] CM/SW Contact:    Gerald [name], [place] Phone Number: 05/30/2022, 11:58 AM  Clinical Narrative:  SW spoke to pt's [name] re PT recommendation for SNF. Pt from home with wife, no previous SNF stay. Explained SNF placement process and answered questions. Pt's wife with no preferred facility but requests a [facility] location. SNF search started, will f/u with offers as available. Updated pt's sister Gerald [name] at wife's request.   Request for SNF and PTAR auth submitted to Gerald [name] with [employer]. SW will provide updates as available.   Gerald [name], MSW, LCSW [phone] (coverage)                   Expected Discharge Plan: [place] Barriers to Discharge: Continued Medical Work up, Ship broker, SNF Pending bed offer   Patient Goals and CMS Choice     Choice offered to / list presented to : Spouse  Expected Discharge Plan and Services Expected Discharge Plan: [place]                                              Prior Living Arrangements/Services   Lives with:: Spouse Patient language and need for interpreter reviewed:: No        Need for Family Participation in Patient Care: Yes (Comment) Care giver support system in place?: Yes (comment)   Criminal Activity/Legal Involvement Pertinent to Current Situation/Hospitalization: No - Comment as needed  Activities of Daily Living      Permission Sought/Granted Permission sought to share information with : Facility Art therapist granted to share information with : Yes, Verbal Permission Granted              Emotional Assessment       Orientation: : Fluctuating Orientation (Suspected and/or reported Sundowners), Oriented to Self, Oriented to Place, Oriented to   Time Alcohol / Substance Use: Alcohol Use, Illicit Drugs Psych Involvement: No (comment)  Admission diagnosis:  Altered mental status, unspecified altered mental status type [Q22.29] Acute metabolic encephalopathy [N98.92] Patient Active Problem List   Diagnosis Date Noted   COVID-19 05/28/2022   Hypernatremia 11/94/1740   Acute metabolic encephalopathy 81/44/8185   Hypomagnesemia 07/27/2021   History of stroke 06/09/2017   Seizure disorder ([place]) 05/22/2016   Anxiety state 04/20/2016   Spastic hemiplegia affecting dominant side ([place]) 09/11/2015   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, [name] - [address] [name] [address] Phone: [phone] Fax: [phone]     Social Determinants of Health (SDOH) Interventions    Readmission Risk Interventions     No data to display

## 2022-05-30 NOTE — NC FL2 (Signed)
[place] LEVEL OF CARE FORM     IDENTIFICATION  Patient Name: Gerald [name] Birthdate: 1960-10-10 Sex: male Admission Date (Current Location): 05/27/2022  [facility] and [place] Number:  Herbalist and Address:  [facility], [place] [address] [place],  [address]      Provider Number: [id number]  Attending Physician Name and Address:  Axel Filler, *  Relative Name and Phone Number:       Current Level of Care: Hospital Recommended Level of Care: [place] Prior Approval Number:    Date Approved/Denied:   PASRR Number: PENDING  Discharge Plan: SNF    Current Diagnoses: Patient Active Problem List   Diagnosis Date Noted   COVID-19 05/28/2022   Hypernatremia 02/72/5366   Acute metabolic encephalopathy 44/08/4740   Hypomagnesemia 07/27/2021   History of stroke 06/09/2017   Seizure disorder ([place]) 05/22/2016   Anxiety state 04/20/2016   Spastic hemiplegia affecting dominant side ([place]) 09/11/2015   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    Hemiparesis affecting right side as late effect of cerebrovascular accident (CVA) ([place]) 07/08/2015   Essential hypertension 01/15/2013   Nicotine dependence 01/15/2013   Alcohol abuse 01/15/2013    Orientation RESPIRATION BLADDER Height & Weight     Self  O2 Incontinent Weight: 242 lb 1 oz (109.8 kg) Height:  6' (182.9 cm)  BEHAVIORAL SYMPTOMS/MOOD NEUROLOGICAL BOWEL NUTRITION STATUS      Incontinent Diet (DYS 3)  AMBULATORY STATUS COMMUNICATION OF NEEDS Skin   Total Care Verbally Normal                       Personal Care Assistance Level of Assistance  Bathing, Dressing Bathing Assistance: Maximum assistance   Dressing Assistance: Maximum assistance     Functional Limitations Info  Sight, Hearing, Speech Sight Info: Impaired Hearing Info: Adequate Speech Info: Impaired    SPECIAL CARE FACTORS FREQUENCY  PT (By licensed  PT), OT (By licensed OT), Speech therapy                    Contractures Contractures Info: Not present    Additional Factors Info                  Current Medications (05/30/2022):  This is the current hospital active medication list Current Facility-Administered Medications  Medication Dose Route Frequency Provider Last Rate Last Admin   amLODipine (NORVASC) tablet 10 mg  10 mg Oral Daily Mapp, Tavien, MD   10 mg at 05/30/22 0906   divalproex (DEPAKOTE) DR tablet 750 mg  750 mg Oral Q8H Masters, Katie, DO   750 mg at 05/30/22 0510   enoxaparin (LOVENOX) injection 100 mg  100 mg Subcutaneous BID Charise Killian, MD   100 mg at 05/30/22 5956   feeding supplement (ENSURE ENLIVE / ENSURE PLUS) liquid 237 mL  237 mL Oral BID BM Axel Filler, MD   237 mL at 05/30/22 0907   irbesartan (AVAPRO) tablet 300 mg  300 mg Oral Daily Mapp, Tavien, MD   300 mg at 05/30/22 3875   lacosamide (VIMPAT) tablet 50 mg  50 mg Oral BID Masters, Katie, DO   50 mg at 05/30/22 6433   levETIRAcetam (KEPPRA) tablet 1,500 mg  1,500 mg Oral BID Masters, Katie, DO   1,500 mg at 05/30/22 2951   multivitamin with minerals tablet 1 tablet  1 tablet Oral Daily Axel Filler, MD  1 tablet at 05/30/22 0906   polyethylene glycol (MIRALAX / GLYCOLAX) packet 17 g  17 g Oral BID Mapp, Tavien, MD   17 g at 05/29/22 5732   potassium chloride SA (KLOR-CON M) CR tablet 40 mEq  40 mEq Oral BID WC Mapp, Tavien, MD   40 mEq at 05/30/22 2567   senna-docusate (Senokot-S) tablet 2 tablet  2 tablet Oral QHS Masters, Katie, DO       thiamine (VITAMIN B1) tablet 100 mg  100 mg Oral Daily Masters, Katie, DO   100 mg at 05/30/22 2091     Discharge Medications: Please see discharge summary for a list of discharge medications.  Relevant Imaging Results:  Relevant Lab Results:   Additional Information SS# [id number]  [name], [name]

## 2022-05-30 NOTE — Progress Notes (Addendum)
Subjective:   Patient is feeling better today. He states that he is undergoing breakfast.  He is still confused why he is in the hospital.  He does not appear to understand where he is at currently. Patient denies having pain or new concerns at this time.  Wife present at bedside. Wife would like to speak with social work regarding SNF placement. Discussed plan to reach out to social work today.  Objective:  Vital signs in last 24 hours: Vitals:   05/29/22 1523 05/29/22 1951 05/29/22 2322 05/30/22 0352  BP: 121/88 134/78 129/81 126/84  Pulse: 91 84 81 80  Resp: 18 14 (!) 22 16  Temp: 98.4 F (36.9 C) 98 F (36.7 C) 98.4 F (36.9 C) 98.5 F (36.9 C)  TempSrc: Oral Oral Oral Oral  SpO2: 96% 96% 95% 94%  Weight:      Height:       Physical Exam: Constitutional: Laying in bed, resting comfortably HENT: dry mucous membraines Cardiovascular: regular rate and rhythm, no murmurs, rubs, or gallops Pulmonary/Chest: normal work of breathing on RA, no wheezing, rales, or rhonchi Abdominal: soft, non-tender, non-distended Neurological: able to follow commands, answers questions appropriately, alert and oriented to self  but not to time, place, or situation, spontaneously moving left upper and lower extremities, paresis of right upper and lower extremities Skin: warm and dry  Assessment/Plan:  Principal Problem:   Acute metabolic encephalopathy Active Problems:   COVID-19   Hypernatremia  Gerald [name] is a 61 y.o. with a pertinent PMH of alcohol use disorder, left ACA infarct, spastic right hemiparesis and aphasia, hypertension and seizures, who presented with mental status and was admitted for acute metabolic encephalopathy.    #Acute metabolic encephalopathy- improving #COVID + Has history of left ACA stroke with residual deficits. Presented w/ decreased function and decreased PO intake. COVID positive. Blood cultures remain negative. Urine drug screen negative.  Satting well  on room air today. Plan to continue working w/ PT/OT.  -F/u Blood cultures -Thiamine 100 mg -PT/OT -RD evaluation   #Hypernatremia - resolved #AKI - prerenal resolved #Hypokalemia Sodium normalized to 144 today. Patient is eating and drinking more today. Potassium low at 2.9 this morning.  Dry mucous membranes on exam. -Encourage PO intake -Replete potassium -Pending Mg level -Trend CMP    #History of Seizure EEG demonstrates mild diffuse encephalopathy secondary to underlying structural or wall abnormality. Low suspicion for seizure at this time. Neuro was consulted and has signed off.  -Vimpat 150 mg BID -Keppra 1500 mg BID -Depakote 750 mg TID   #Elevated transaminase Hyperbilirubinemia AST improved from 49 to 45 today.  -Trend CMP   #Gamma gap Total protein 6 with albumin 2.2. HCV pending.  -F/u HCV   #Hx of L ACA CVA c/b spastic R hemiparesis and aphasia Patient is working w/ PT/OT. He is able to transfer to a wheelchair. Plan for SNF following discharge.  Will reach out to social work today to speak with wife regarding SNF placement.  -PT/OT   #Constipation 2 bowel movements charted for yesterday. Patient does not report having a bowel movement today. -Senna 2 tablets nightly -MiraLAX BID   #HTN BP normalized since yesterday.  -Continue amlodipine 10 mg and irbesartan 300 mg daily   Diet: Dysphagia 3 IVF: none VTE: Enoxaparin Code: Full PT/OT recs: SNF   Prior to Admission Living Arrangement: home w/ wife Anticipated Discharge Location: SNF Barriers to Discharge: Continued management Dispo: Anticipated discharge in approximately less than 2  day(s).   Starlyn Skeans, MD 05/30/2022, 5:45 AM Pager: [phone] After 5pm on weekdays and 1pm on weekends: On Call pager [phone]

## 2022-05-31 DIAGNOSIS — U071 COVID-19: Secondary | ICD-10-CM | POA: Diagnosis not present

## 2022-05-31 DIAGNOSIS — E876 Hypokalemia: Secondary | ICD-10-CM | POA: Diagnosis not present

## 2022-05-31 DIAGNOSIS — G9341 Metabolic encephalopathy: Secondary | ICD-10-CM | POA: Diagnosis not present

## 2022-05-31 DIAGNOSIS — F17210 Nicotine dependence, cigarettes, uncomplicated: Secondary | ICD-10-CM | POA: Diagnosis not present

## 2022-05-31 LAB — GLUCOSE, CAPILLARY
Glucose-Capillary: 137 mg/dL — ABNORMAL HIGH (ref 70–99)
Glucose-Capillary: 77 mg/dL (ref 70–99)
Glucose-Capillary: 83 mg/dL (ref 70–99)
Glucose-Capillary: 86 mg/dL (ref 70–99)
Glucose-Capillary: 91 mg/dL (ref 70–99)
Glucose-Capillary: 93 mg/dL (ref 70–99)
Glucose-Capillary: 98 mg/dL (ref 70–99)

## 2022-05-31 LAB — BASIC METABOLIC PANEL
Anion gap: 7 (ref 5–15)
BUN: 6 mg/dL (ref 6–20)
CO2: 26 mmol/L (ref 22–32)
Calcium: 8.7 mg/dL — ABNORMAL LOW (ref 8.9–10.3)
Chloride: 109 mmol/L (ref 98–111)
Creatinine, Ser: 0.76 mg/dL (ref 0.61–1.24)
GFR, Estimated: >60 mL/min (ref >60)
Glucose, Bld: 92 mg/dL (ref 70–99)
Potassium: 3.4 mmol/L — ABNORMAL LOW (ref 3.5–5.1)
Sodium: 142 mmol/L (ref 135–145)

## 2022-05-31 LAB — MAGNESIUM: Magnesium: 1.9 mg/dL (ref 1.7–2.4)

## 2022-05-31 MED ORDER — ASPIRIN 81 MG PO TBEC
81.0000 mg | DELAYED_RELEASE_TABLET | Freq: Every day | ORAL | Status: DC
  Administered 2022-05-31 – 2022-06-01 (×2): 81 mg via ORAL
  Filled 2022-05-31 (×2): qty 1

## 2022-05-31 MED ORDER — METOPROLOL TARTRATE 25 MG PO TABS
25.0000 mg | ORAL_TABLET | Freq: Two times a day (BID) | ORAL | Status: DC
  Administered 2022-05-31 – 2022-06-01 (×3): 25 mg via ORAL
  Filled 2022-05-31 (×3): qty 1

## 2022-05-31 NOTE — TOC Progression Note (Signed)
Transition of Care [facility]) - Progression Note    Patient Details  Name: Gerald [name] MRN: [id number] Date of Birth: 13-Apr-1961  Transition of Care [facility]) CM/SW [place], [place] Phone Number: 05/31/2022, 11:10 AM  Clinical Narrative:     1:34pm- received text message form patient's sister, [name]- preferred SNF choice is [facility] or [facility] confirmed bed offer and can admit patient tomorrow-   CSW provided patient's sister, [name] w/ bed offers- waiting SNF choice   Received insurance authorization # [id number] ( for 7 days)  PTAR [phone]- updated SNF choice w/HTA, Left voice message   Thurmond Butts, MSW, LCSW Clinical Social Worker    Expected Discharge Plan: Skilled Nursing Facility Barriers to Discharge: Continued Medical Work up, Ship broker, SNF Pending bed offer  Expected Discharge Plan and Services Expected Discharge Plan: [place]                                               Social Determinants of Health (SDOH) Interventions    Readmission Risk Interventions     No data to display

## 2022-05-31 NOTE — Progress Notes (Signed)
   05/31/22 1336  Assess: MEWS Score  BP (!) 153/98  MAP (mmHg) 111  Pulse Rate (!) 113  ECG Heart Rate (!) 113  Resp 15  Level of Consciousness Alert  SpO2 94 %  O2 Device Room Air  Assess: MEWS Score  MEWS Temp 0  MEWS Systolic 0  MEWS Pulse 2  MEWS RR 0  MEWS LOC 0  MEWS Score 2  MEWS Score Color Yellow  Assess: if the MEWS score is Yellow or Red  Were vital signs taken at a resting state? Yes  Focused Assessment No change from prior assessment  Does the patient meet 2 or more of the SIRS criteria? No  MEWS guidelines implemented *See Row Information* Yes  Treat  MEWS Interventions Administered scheduled meds/treatments  Pain Scale 0-10  Pain Score 0  Take Vital Signs  Increase Vital Sign Frequency  Yellow: Q 2hr X 2 then Q 4hr X 2, if remains yellow, continue Q 4hrs  Escalate  MEWS: Escalate Yellow: discuss with charge nurse/RN and consider discussing with provider and RRT  Notify: Charge Nurse/RN  Name of Charge Nurse/RN Notified Melissa RN  Date Charge Nurse/RN Notified 05/31/22  Time Charge Nurse/RN Notified 1356  Provider Notification  Provider Name/Title Teaching Service MD During Rounds  Date Provider Notified 05/31/22  Time Provider Notified 1330  Method of Notification Rounds  Notification Reason Change in status (Tachycardia)  Provider response At bedside;See new orders  Date of Provider Response 05/31/22  Time of Provider Response 1330  Assess: SIRS CRITERIA  SIRS Temperature  0  SIRS Pulse 1  SIRS Respirations  0  SIRS WBC 0  SIRS Score Sum  1

## 2022-05-31 NOTE — Progress Notes (Signed)
Subjective:   Patient is feeling better than yesterday, says his breathing is improving and not having a cough or CP. Does have some audible upper respiratory mucus production. He says his appetite is good and he is using the bathroom well. He wants to go home and asks when he will be able to do so.  Objective:  Vital signs in last 24 hours: Vitals:   05/31/22 1245 05/31/22 1336 05/31/22 1355 05/31/22 1542  BP: (!) 175/112 (!) 153/98 (!) 150/92 131/89  Pulse: (!) 105 (!) 113 (!) 116   Resp: (!) '21 15 12 '$ (!) 22  Temp: 97.8 F (36.6 C)   98.1 F (36.7 C)  TempSrc: Oral   Oral  SpO2: 96% 94% 93% 97%  Weight:      Height:       Physical Exam: Gen: chronically ill appearing, no acute distress, pleasant and cooperative Cardiovascular: regular rate and rhythm, no murmurs, rubs, or gallops Pulmonary/Chest: normal work of breathing on RA, no wheezing, rales, or rhonchi Abdominal: soft, non-tender, non-distended Neurological: alert and oriented to person and place, attentive, spontaneously moves all extremities  Assessment/Plan:  Principal Problem:   Acute metabolic encephalopathy Active Problems:   COVID-19   Hypernatremia  Gerald [name] is a 61 y.o. with a pertinent PMH of alcohol use disorder, left ACA infarct, spastic right hemiparesis and aphasia, hypertension and seizures, who presented with mental status and was admitted for acute metabolic encephalopathy that is now improving and appears to be at baseline per family.    Acute metabolic encephalopathy- improving COVID + Presented w/ decreased function and decreased PO intake with dehydration secondary to COVID. Per wife appears he is at baseline mental status. He is alert, oriented to person and place, answers questions appropriate. He remains confused about why he was in the hospital although I do think this may remain the case given the degree of encephalopathy on presentation. PT/OT recommending SNF, secured bed at  [facility] starting 12/19. Covid symptoms started Monday 12/11 however no repeat test required. I expect rehab will be short but to safely go home he will need good strength in his L leg which is his functional leg. Currently still getting home PT/OT for his CVA in 2017 that cause R spastic hemiparesis. -PT/OT   Hypernatremia - resolved AKI - prerenal resolved Hypokalemia Sodium wnl at 142 today. Potassium slightly low at 3.4. Creatinine remains within baseline at 0.76.  -Encourage PO intake -Trend BMP    History of Seizure EEG demonstrated mild diffuse encephalopathy secondary to underlying structural abnormality. Low suspicion for seizures as a cause of presenting encephalopathy. Neuro was consulted and has signed off following home med adjustments.  -Vimpat 150 mg BID -Keppra 1500 mg BID -Depakote 750 mg TID  Hx of L ACA CVA c/b spastic R hemiparesis and aphasia Patient is working w/ PT/OT. He is able to transfer to a wheelchair. Plan for SNF following discharge.  Will reach out to social work today to speak with wife regarding SNF placement.  -Stop xarelto, start ASA 81 -PT/OT   Constipation No BM documented since 12.16. -Senna 2 tablets nightly -MiraLAX BID   HTN -Continue amlodipine 10 mg and irbesartan 300 mg daily   Diet: Dysphagia 3 IVF: none VTE: Enoxaparin Code: Full PT/OT recs: SNF   Prior to Admission Living Arrangement: home w/ wife Anticipated Discharge Location: SNF Barriers to Discharge: Continued management Dispo: Anticipated discharge in approximately less than 2 day(s).   Iona Coach, MD 05/31/2022,  4:32 PM Pager: [phone] After 5pm on weekdays and 1pm on weekends: On Call pager [phone]

## 2022-06-01 DIAGNOSIS — G8190 Hemiplegia, unspecified affecting unspecified side: Secondary | ICD-10-CM | POA: Diagnosis not present

## 2022-06-01 DIAGNOSIS — R279 Unspecified lack of coordination: Secondary | ICD-10-CM | POA: Diagnosis not present

## 2022-06-01 DIAGNOSIS — G8110 Spastic hemiplegia affecting unspecified side: Secondary | ICD-10-CM | POA: Diagnosis not present

## 2022-06-01 DIAGNOSIS — I639 Cerebral infarction, unspecified: Secondary | ICD-10-CM | POA: Diagnosis not present

## 2022-06-01 DIAGNOSIS — R41841 Cognitive communication deficit: Secondary | ICD-10-CM | POA: Diagnosis not present

## 2022-06-01 DIAGNOSIS — G40909 Epilepsy, unspecified, not intractable, without status epilepticus: Secondary | ICD-10-CM | POA: Diagnosis not present

## 2022-06-01 DIAGNOSIS — I10 Essential (primary) hypertension: Secondary | ICD-10-CM | POA: Diagnosis not present

## 2022-06-01 DIAGNOSIS — R569 Unspecified convulsions: Secondary | ICD-10-CM

## 2022-06-01 DIAGNOSIS — R7989 Other specified abnormal findings of blood chemistry: Secondary | ICD-10-CM | POA: Diagnosis not present

## 2022-06-01 DIAGNOSIS — Z7409 Other reduced mobility: Secondary | ICD-10-CM | POA: Diagnosis not present

## 2022-06-01 DIAGNOSIS — G9341 Metabolic encephalopathy: Secondary | ICD-10-CM | POA: Diagnosis not present

## 2022-06-01 DIAGNOSIS — R5383 Other fatigue: Secondary | ICD-10-CM | POA: Diagnosis not present

## 2022-06-01 DIAGNOSIS — Z8673 Personal history of transient ischemic attack (TIA), and cerebral infarction without residual deficits: Secondary | ICD-10-CM | POA: Diagnosis not present

## 2022-06-01 DIAGNOSIS — R4701 Aphasia: Secondary | ICD-10-CM | POA: Diagnosis not present

## 2022-06-01 DIAGNOSIS — R1311 Dysphagia, oral phase: Secondary | ICD-10-CM | POA: Diagnosis not present

## 2022-06-01 DIAGNOSIS — Z7401 Bed confinement status: Secondary | ICD-10-CM | POA: Diagnosis not present

## 2022-06-01 DIAGNOSIS — M62421 Contracture of muscle, right upper arm: Secondary | ICD-10-CM | POA: Diagnosis not present

## 2022-06-01 DIAGNOSIS — F17210 Nicotine dependence, cigarettes, uncomplicated: Secondary | ICD-10-CM | POA: Diagnosis not present

## 2022-06-01 DIAGNOSIS — R3981 Functional urinary incontinence: Secondary | ICD-10-CM | POA: Diagnosis not present

## 2022-06-01 DIAGNOSIS — F1010 Alcohol abuse, uncomplicated: Secondary | ICD-10-CM | POA: Diagnosis not present

## 2022-06-01 DIAGNOSIS — R410 Disorientation, unspecified: Secondary | ICD-10-CM | POA: Diagnosis not present

## 2022-06-01 DIAGNOSIS — I2510 Atherosclerotic heart disease of native coronary artery without angina pectoris: Secondary | ICD-10-CM | POA: Diagnosis not present

## 2022-06-01 DIAGNOSIS — F29 Unspecified psychosis not due to a substance or known physiological condition: Secondary | ICD-10-CM | POA: Diagnosis not present

## 2022-06-01 DIAGNOSIS — M6281 Muscle weakness (generalized): Secondary | ICD-10-CM | POA: Diagnosis not present

## 2022-06-01 DIAGNOSIS — J329 Chronic sinusitis, unspecified: Secondary | ICD-10-CM | POA: Diagnosis not present

## 2022-06-01 DIAGNOSIS — R4182 Altered mental status, unspecified: Secondary | ICD-10-CM | POA: Diagnosis not present

## 2022-06-01 DIAGNOSIS — G4089 Other seizures: Secondary | ICD-10-CM | POA: Diagnosis not present

## 2022-06-01 DIAGNOSIS — F17200 Nicotine dependence, unspecified, uncomplicated: Secondary | ICD-10-CM | POA: Diagnosis not present

## 2022-06-01 DIAGNOSIS — U071 COVID-19: Secondary | ICD-10-CM | POA: Diagnosis not present

## 2022-06-01 DIAGNOSIS — E870 Hyperosmolality and hypernatremia: Secondary | ICD-10-CM | POA: Diagnosis not present

## 2022-06-01 DIAGNOSIS — F1090 Alcohol use, unspecified, uncomplicated: Secondary | ICD-10-CM | POA: Diagnosis not present

## 2022-06-01 DIAGNOSIS — Z23 Encounter for immunization: Secondary | ICD-10-CM | POA: Diagnosis not present

## 2022-06-01 DIAGNOSIS — E785 Hyperlipidemia, unspecified: Secondary | ICD-10-CM | POA: Diagnosis not present

## 2022-06-01 LAB — GLUCOSE, CAPILLARY
Glucose-Capillary: 87 mg/dL (ref 70–99)
Glucose-Capillary: 86 mg/dL (ref 70–99)
Glucose-Capillary: 103 mg/dL — ABNORMAL HIGH (ref 70–99)

## 2022-06-01 LAB — BASIC METABOLIC PANEL
Anion gap: 6 (ref 5–15)
BUN: 8 mg/dL (ref 6–20)
CO2: 27 mmol/L (ref 22–32)
Calcium: 8.6 mg/dL — ABNORMAL LOW (ref 8.9–10.3)
Chloride: 107 mmol/L (ref 98–111)
Creatinine, Ser: 0.68 mg/dL (ref 0.61–1.24)
GFR, Estimated: >60 mL/min (ref >60)
Glucose, Bld: 100 mg/dL — ABNORMAL HIGH (ref 70–99)
Potassium: 3.4 mmol/L — ABNORMAL LOW (ref 3.5–5.1)
Sodium: 140 mmol/L (ref 135–145)

## 2022-06-01 LAB — CULTURE, BLOOD (ROUTINE X 2): Culture: NO GROWTH

## 2022-06-01 LAB — VALPROIC ACID LEVEL: Valproic Acid Lvl: 63 ug/mL (ref 50.0–100.0)

## 2022-06-01 MED ORDER — LACOSAMIDE 50 MG PO TABS: 50.0000 mg | ORAL_TABLET | Freq: Two times a day (BID) | ORAL | 0 refills | Status: DC

## 2022-06-01 MED ORDER — POTASSIUM CHLORIDE CRYS ER 20 MEQ PO TBCR
40.0000 meq | EXTENDED_RELEASE_TABLET | Freq: Once | ORAL | Status: AC
  Administered 2022-06-01: 40 meq via ORAL
  Filled 2022-06-01: qty 2

## 2022-06-01 MED ORDER — ASPIRIN 81 MG PO TBEC: 81.0000 mg | DELAYED_RELEASE_TABLET | Freq: Every day | ORAL | 12 refills | Status: AC

## 2022-06-01 MED ORDER — DIVALPROEX SODIUM 250 MG PO DR TAB: 750.0000 mg | DELAYED_RELEASE_TABLET | Freq: Three times a day (TID) | ORAL | 0 refills | Status: DC

## 2022-06-01 NOTE — Discharge Instructions (Signed)
You were hospitalized for COVID which caused you to become dehydrated from not eating and also confused. Given your history of stroke a CT and MRI of your brain was done and there was no evidence of new stroke or brain bleed.During the hospitalization we gave you fluids and also helped you start to eat and drink again and your confusion got better. You worked with the physical therapists during your hospital stay and at discharge they recommended**. The neurology team evaluated you in case your confusion was from seizures, which it was not. They changed some of your medications around because the levels of medication in your body were not high enough, and the new doses of your medications have been ordered and also noted on your after visit summary. You will need to continue to work with PT/OT. We have a hospital follow up visist scheduled for you in our clinic and will continue to see you in our clinic as your primary care doctors.

## 2022-06-01 NOTE — Discharge Summary (Addendum)
Name: Gerald [name] MRN: [id number] DOB: [birth year] 61 y.o. PCP: Linward Natal, MD  Date of Admission: 05/27/2022  4:34 PM Date of Discharge: 06/01/2022 Attending Physician: Angelica Pou, MD  Discharge Diagnosis: 1. 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   aspirin EC 81 MG tablet Take 1 tablet (81 mg total) by mouth daily. Swallow whole. Start taking on: June 02, 2022   atorvastatin 20 MG tablet Commonly known as: LIPITOR Take 1 tablet (20 mg total) by mouth daily at 6 PM. What changed: when to take this   divalproex 250 MG DR tablet Commonly known as: DEPAKOTE Take 3 tablets (750 mg total) by mouth every 8 (eight) hours. Replaces: divalproex 500 MG 24 hr tablet   lacosamide 50 MG Tabs tablet Commonly known as: VIMPAT Take 1 tablet (50 mg total) by mouth 2 (two) times daily. What changed:  medication strength how much to take   levETIRAcetam 750 MG tablet Commonly known as: KEPPRA Take 2 tablets (1,500 mg total) by mouth 2 (two) times daily.   metoprolol tartrate 25 MG tablet Commonly known as: LOPRESSOR Take 1 tablet (25 mg total) by mouth 2 (two) times daily.        Disposition and follow-up:   Gerald [name] was discharged from [facility] in [place] condition.  At the  hospital follow up visit please address:  Elevated Transaminase Hyperbilirubinemia Would trend a CMP every few months and would also continue to evaluate for occurrence of any signs/symptoms consistent with cirrhosis. Should continue to discuss alcohol cessation with this patient.  HTN BP consistently [id number] systolic in the hospital. Please evaluate the need for additional medications to control BP especially given the patients prior CVA and overall higher risk for stroke.  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 Follow this up on CMP. Consider SPEP and UPEP if continues to be elevated.  2.  Labs / imaging needed at time of follow-up: CMP  3.  Pending labs/ test needing follow-up: NA  Follow-up Appointments:  Encounter Information  Provider [place]  06/15/2022 9:15 AM Linward Natal, MD [facility] Course by problem list: 1. Acute Metabolic encephalopathy  Patient presented with blank stares, AMS and loss of IADLs/ADLs after his wife had what she felt like was the flu and the patient developed cough.CT head without acute intracranial pathology and MRI negative for acute stroke. Patient was without fever during the admission and without wbc elevation although was found to be COVID positive. Additionally he was found to be dehydrated on exam with AKI and mild hypernatremia to 148 consistent with poor PO intake from his COVID infection. During hospitalization he improved without antivirals and his mentation at discharge  was consistent with baseline per his wife. At discharge he was A&O to person, place but still a bit confused about the reason he was hospitalized. His AKI and hypernatremia also resolved and his PO intake improved. He was evaluated by SLP and placed on a dysphagia 3 diet. He was also evaluated by PT/OT who recommended SNF as his wife was not  able to transfer him alone.   Seizure  Hospitalized 07/2021 for seizure with status epilepticus secondary to sleep deprivation and hypomagnesemia. EEG at that time demonstrated electrographic seizures arising from the left hemisphere. Lactate elevated to 2.1 during this admission although most likely related to his AKI given no evidence of convulsion, urinary incontinence or tongue biting at home an d no evidence of seizure activity on spot EEG. All antiepileptic medication levels were within therapeutic range except Depakote and ammonia was also wnl in the setting of Depakote use. Neurology followed the patient and adjusted his anti-seizure medications. Discharged on Vimpat 50 mg BID,Keppra 1500 mg BID,Depakote 750 mg TID.   Pre-renal AKI Dehydration Mild Hypernatremia Creatinine elevated to 1.31 on admission with baseline of 0.8-1.0. Mildly hypernatremia to 148, poor skin turgor, sinus tachycardia to 130s all consistent with intravascular volume depletion. With IVF and increased PO intake patient was discharged with Cr 0.68, Na 140.  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 Synthetic function is not overtly decreased as discussed above in the encephalopathy problem. No evidence of ascites,palmar erythema, caput medusa to suggest decompensated cirrhosis, no evidence of melena or hematochezia during hospitalization. Would benefit from RUQ as an out patient given no prior imaging of the liver. Currently drinks up to a 6 pack on football nights per the wife, would also benefit from alcohol cessation.   Gamma Gap Total protein initially found to be 8.8 and albumin 2.6.Suspect this was all in the setting of COVID infection as well as low albumin.HIV negative 10 months ago. HCV negative. Does not have prior elevated gamma gap or elevated calcium and his acute renal function change is consistent with  dehydration, so did not get SPEP or UPEP but can repeat labs as an outpatient to see if the gamma gap closes.  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 ContinuedAtorvastatin   HTN Continued Amlodipine olmesartan 10-40  Subjective: Patient says he is feeling much better and his wife who is at the bedside agrees. He still has some confusion about why he is in the hospital but is alert and oriented to person and place. He expresses wanting to go home. He does endorse pain of his heels. His wife says he uses a sliding board at home to transfer. She would like him home at the holidays if he is strong enough to help her out a little. She is amenable to getting repeat PT evaluation to possibly go home instead of SNF, otherwise ok with SNF if that is needed.  Discharge Exam:   BP (!) 150/53 (BP Location: Left Arm)   Pulse 83   Temp 97.8 F (36.6 C) (Oral)   Resp 17   Ht 6' (1.829 m)   Wt 109.8 kg   SpO2 94%   BMI 32.83 kg/m  Discharge exam:  Gen: chronically ill appearing, no acute distress, pleasant and cooperative Cardiovascular: regular rate and rhythm, no murmurs, rubs, or gallops Pulmonary/Chest: normal work of  breathing on RA, no wheezing, rales, or rhonchi Abdominal: soft, non-tender, non-distended Neurological: alert and oriented to person and place, attentive, spontaneously moves all extremities Extrem:warm, dry, no LE edema, stage 1 pressure ulcer of the R Lateral heel with pain to palpation  Pertinent Labs, Studies, and Procedures:     Latest Ref Rng & Units 05/30/2022    3:34 AM 05/29/2022   10:40 AM 05/27/2022    5:40 PM  CBC  WBC 4.0 - 10.5 K/uL 6.1  6.5  8.0   Hemoglobin 13.0 - 17.0 g/dL 13.2  14.7  16.6   Hematocrit 39.0 - 52.0 % 39.6  45.3  51.5   Platelets 150 - 400 K/uL 218  227  327        Latest Ref Rng & Units 06/01/2022    5:03 AM 05/31/2022    5:19 AM 05/30/2022    3:34 AM  CMP  Glucose 70 - 99 mg/dL 100   92  113   BUN 6 - 20 mg/dL '8  6  6   '$ Creatinine 0.61 - 1.24 mg/dL 0.68  0.76  0.70   Sodium 135 - 145 mmol/L 140  142  144   Potassium 3.5 - 5.1 mmol/L 3.4  3.4  2.9   Chloride 98 - 111 mmol/L 107  109  109   CO2 22 - 32 mmol/L '27  26  28   '$ Calcium 8.9 - 10.3 mg/dL 8.6  8.7  8.5   Total Protein 6.5 - 8.1 g/dL   6.0   Total Bilirubin 0.3 - 1.2 mg/dL   0.8   Alkaline Phos 38 - 126 U/L   41   AST 15 - 41 U/L   45   ALT 0 - 44 U/L   27    EEG adult  Result Date: 05/28/2022 Alric Ran, MD     05/28/2022  8:05 AM Patient Name: Gerald [name] MRN: [id number] Epilepsy Attending: Alric Ran Referring Physician/Provider: Date: 05/28/2022 Duration: Patient history:  61 y.o. 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 EEG to assess for seizure Level of alertness: lethargic AEDs during EEG study: Lacosamide, Levetiracetam, Valproate, Technical aspects: This EEG study was done with scalp electrodes positioned according to the 10-20 International system of electrode placement. Electrical activity was reviewed with band pass filter of 1-'70Hz'$ , sensitivity of 7 uV/mm, display speed of 53m/sec with a '60Hz'$  notched filter applied as appropriate. EEG data were recorded continuously and digitally stored.  Video monitoring was available and reviewed as appropriate. Description: EEG showed continuous generalized polymorphic sharply contoured wave up to 6 Hz, theta slowing. There was also intermittent left parasagittal slowing. Hyperventilation and photic stimulation were not performed.   ABNORMALITY - Continuous slow, generalized - Intermittent left parasagittal slowing IMPRESSION: This study is suggestive of mild diffuse encephalopathy, nonspecific etiology but likely related to  sedation, toxic-metabolic etiology. This study is also suggestive of cortical dysfunction arising from left parasagittal region, nonspecific etiology, likely secondary to underlying structural abnormality. Dr. ARory Percywas notified. AAlric Ran   MR BRAIN WO CONTRAST  Result Date: 05/27/2022 CLINICAL DATA:  Transient ischemic attack EXAM: MRI HEAD WITHOUT CONTRAST TECHNIQUE: Multiplanar, multiecho pulse sequences of the brain and surrounding structures were obtained without intravenous contrast. COMPARISON:  None Available. FINDINGS: Brain: No acute infarct, mass effect  or extra-axial collection. No acute or chronic hemorrhage. 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 Multifocal chronic white matter hyperintense T2-weighted signal. The midline structures are normal. Vascular: Major flow voids are preserved. Skull and upper cervical spine: Normal calvarium and skull base. Visualized upper cervical spine and soft tissues are normal. Sinuses/Orbits:Right maxillary and bilateral ethmoid sinusitis. Normal orbits. IMPRESSION: 1. No acute intracranial abnormality. 2. 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 Electronically Signed   By: Ulyses Jarred M.D.   On: 05/27/2022 22:13   DG Chest Port 1 View  Result Date: 05/27/2022 CLINICAL DATA:  Slurred speech, possible sepsis EXAM: PORTABLE CHEST 1 VIEW COMPARISON:  01/15/2013 FINDINGS: Cardiac size is within normal limits. Thoracic aorta is ectatic. Apparent shift of mediastinum to the right may be due to rotation. Lung fields are clear of any infiltrates or pulmonary edema. There is poor inspiration. There is no pleural effusion or pneumothorax. IMPRESSION: There are no signs of pulmonary edema or focal pulmonary consolidation. Electronically Signed   By: Elmer Picker M.D.   On: 05/27/2022 19:25   CT Head Wo Contrast  Result Date: 05/27/2022 CLINICAL DATA:  Mental status change. EXAM: CT HEAD WITHOUT CONTRAST TECHNIQUE: Contiguous axial images were obtained from the base  of the skull through the vertex without intravenous contrast. RADIATION DOSE REDUCTION: This exam was performed according to the departmental dose-optimization program which includes automated exposure control, adjustment of the mA and/or kV according to patient size and/or use of iterative reconstruction technique. COMPARISON:  07/27/2021 FINDINGS: Brain: There is periventricular white matter decreased attenuation consistent with small vessel ischemic changes. Ventricles, sulci and cisterns are prominent consistent with age related involutional changes. No acute intracranial hemorrhage, mass effect or shift. Encephalomalacia consistent with an old left ACA CVA. Compensatory dilatation left lateral ventricle frontal horn. Subinsular encephalomalacia in the left consistent with chronic lacunar CVA. Vascular: No hyperdense vessel or unexpected calcification. Skull: Normal. Negative for fracture or focal lesion. Sinuses/Orbits: Mucoperiosteal thickening consistent with chronic pansinusitis. Right maxillary antrum completely opacify with bony expansion consistent with a mucocele. IMPRESSION: 1. Atrophy and chronic small vessel ischemic changes. 2. Old left ACA and subinsular CVAs. 3. Chronic pansinusitis and right maxillary mucocele. 4. No acute intracranial process identified. Electronically Signed   By: Sammie Bench M.D.   On: 05/27/2022 18:00     Discharge Instructions: Discharge Instructions     Diet - low sodium heart healthy   Complete by: As directed    Increase activity slowly   Complete by: As directed       You were hospitalized for COVID which caused you to become dehydrated from not eating and also confused. Given your history of stroke a CT and MRI of your brain was done and there was no evidence of new stroke or brain bleed.During the hospitalization we gave you fluids and also helped you start to eat and drink again and your confusion got better. You worked with the physical therapists  during your hospital stay and at discharge they recommended that you need to go to a skilled nursing facility to help with your recovery. The neurology team evaluated you in case your confusion was from seizures, which it was not. They changed some of your medications around because the levels of medication in your body were not high enough, and the new doses of your medications have been ordered and also noted on your after visit summary. You will need to continue to work with PT/OT. We have a hospital follow up visist scheduled  for you in our clinic and will continue to see you in our clinic as your primary care doctors.    Signed: Iona Coach, MD 06/01/2022, 1:07 PM   Pager: [phone]

## 2022-06-01 NOTE — TOC Transition Note (Signed)
Transition of Care [facility]) - CM/SW Discharge Note   Patient Details  Name: Gerald [name] MRN: [id number] Date of Birth: July 02, 1960  [facility]) CM/SW Contact:  Vinie Sill, LCSW Phone Number: 06/01/2022, 2:07 PM   Clinical Narrative:     Patient will Discharge to: [facility] Discharge Date: 06/01/2022 Family Notified: sent message to sister Transport By: Corey Harold  Per MD patient is ready for discharge. RN, patient, and facility notified of discharge. Discharge Summary sent to facility. RN given number for [phone]. Ambulance transport requested for patient.   Clinical Social Worker signing off.  Thurmond Butts, MSW, LCSW Clinical Social Worker     Final next level of care: Skilled Nursing Facility Barriers to Discharge: Barriers Resolved   Patient Goals and CMS Choice     Choice offered to / list presented to : Spouse  Discharge Placement              Patient chooses bed at:  [facility]) Patient to be transferred to facility by: [place] Name of family member notified: sister Patient and family notified of of transfer: 06/01/22  Discharge Plan and Services                                     Social Determinants of Health (SDOH) Interventions     Readmission Risk Interventions     No data to display

## 2022-06-01 NOTE — Hospital Course (Addendum)
Acute Metabolic encephalopathy  Patient presented with blank stares, AMS and loss of IADLs/ADLs after his wife had what she felt like was the flu and the patient developed cough.CT head without acute intracranial pathology and MRI negative for acute stroke. Patient was without fever during the admission and without wbc elevation although was found to be COVID positive. Additionally he was found to be dehydrated on exam with AKI and mild hypernatremia to 148 consistent with poor PO intake from his COVID infection. During hospitalization he improved without antivirals and his mentation at discharge was consistent with baseline per his wife. At discharge he was A&O to person, place but still a bit confused about the reason he was hospitalized. His AKI and hypernatremia also resolved and his PO intake improved. He was evaluated by SLP and placed on a dysphagia 3 diet. He was also evaluated by PT/OT who recommended **.   Seizure  Hospitalized 07/2021 for seizure with status epilepticus secondary to sleep deprivation and hypomagnesemia. EEG at that time demonstrated electrographic seizures arising from the left hemisphere. Lactate elevated to 2.1 during this admission although most likely related to his AKI given no evidence of convulsion, urinary incontinence or tongue biting at home an d no evidence of seizure activity on spot EEG. All antiepileptic medication levels were within therapeutic range and ammonia was also wnl in the setting of Depakote use. Neurology followed the patient and adjusted his anti-seizure medications. Discharged on Vimpat 150 mg BID,Keppra 1500 mg BID,Depakote 750 mg TID.   Pre-renal AKI Dehydration Mild Hypernatremia Creatinine elevated to 1.31 on admission with baseline of 0.8-1.0. Mildly hypernatremia to 148, poor skin turgor, sinus tachycardia to 130s all consistent with intravascular volume depletion. With IVF and increased PO intake patient was discharged with Cr 0.68, Na 140.     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 Synthetic function is not overtly decreased as discussed above in the encephalopathy problem. No evidence of ascites,palmar erythema, caput medusa to suggest decompensated cirrhosis, no evidence of melena or hematochezia during hospitalization. Would benefit from RUQ as an out patient given no prior imaging of the liver. Currently drinks up to a 6 pack on football nights per the wife, would also benefit from alcohol cessation.   Gamma Gap Total protein initially found to be 8.8 and albumin 2.6.Suspect this was all in the setting of COVID infection as well as low albumin.HIV negative 10 months ago. HCV negative. Does not have prior elevated gamma gap or elevated calcium and his acute renal function change is consistent with dehydration, so did not get SPEP or UPEP but can repeat labs as an outpatient to see if the gamma gap closes.  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 ContinuedAtorvastatin   HTN Continued Amlodipine olmesartan 10-40

## 2022-06-01 NOTE — TOC Progression Note (Signed)
Transition of Care [facility]) - Progression Note    Patient Details  Name: [name] MRN: [id number] Date of Birth: April 10, 1961  Transition of Care [facility]) CM/SW [place], [place] Phone Number: 06/01/2022, 11:26 AM  Clinical Narrative:     Cyndie Mull #  [id number]  Expected Discharge Plan: [place] Barriers to Discharge: Continued Medical Work up, Ship broker, SNF Pending bed offer  Expected Discharge Plan and Services Expected Discharge Plan: [place]                                               Social Determinants of Health (SDOH) Interventions    Readmission Risk Interventions     No data to display

## 2022-06-01 NOTE — Care Management Important Message (Signed)
Important Message  Patient Details  Name: Gerald [name] MRN: [id number] Date of Birth: 1960/12/24   Medicare Important Message Given:  Yes  Per patient family Member IM will be mail to the patient home address   Orbie Pyo 06/01/2022, 4:04 PM

## 2022-06-01 NOTE — Progress Notes (Signed)
Physical Therapy Treatment Patient Details Name: Gerald [name] MRN: [id number] DOB: Oct 08, 1960 Today's Date: 06/01/2022   History of Present Illness 61 y.o. 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    MRI Brain: No acute intracranial abnormality.  2. 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    PT Comments    Pt initially expressing desire to d/c home, PT focused session on transfer training with wife involved in transfers given she is the primary caregiver at home. Pt requiring max +1-2 for bed mobility and transfer to/from drop arm recliner, per wife who was physically assisting during initial transfer pt is not at baseline level of mobility. Pt's wife is agreeable to SNF and thinks pt needs it prior to d/c home, pt still wants to d/c home. PT recommendations remain unchanged, PT to continue to follow.     Recommendations for follow up therapy are one component of a multi-disciplinary discharge planning process, led by the attending physician.  Recommendations may be updated based on patient status, additional functional criteria and insurance authorization.  Follow Up Recommendations  Skilled nursing-short term rehab (<3 hours/day)     Assistance Recommended at Discharge Frequent or constant Supervision/Assistance  Patient can return home with the following Two people to help with walking and/or transfers;Two people to help with bathing/dressing/bathroom;Assist for transportation;Help with stairs or ramp for entrance   Equipment Recommendations  None recommended by PT    Recommendations for Other Services       Precautions / Restrictions Precautions Precautions: Fall Precaution Comments: Covid+ Restrictions Weight Bearing Restrictions: No     Mobility   Bed Mobility Overal bed mobility: Needs Assistance Bed Mobility: Supine to Sit, Sit to Supine     Supine to sit: Max assist Sit to supine: Max assist   General bed mobility comments: assist for trunk elevation/lowering, LE management, scooting to/from EOB.    Transfers Overall transfer level: Needs assistance Equipment used: 1 person hand held assist, 2 person hand held assist Transfers: Bed to chair/wheelchair/BSC       Squat pivot transfers: Max assist, +2 physical assistance     General transfer comment: initially max +2 safety for bed>chair for power up, steadying, and safe pivot into drop arm recliner towards L. Max +2 physical assist for transfer back to bed given pt fatigue and difficulty with power up with only +1. Wife primarily assisted on transfer from bed to recliner to see if pt was close to baseline    Ambulation/Gait                   Stairs             Wheelchair Mobility    Modified Rankin (Stroke Patients Only)       Balance Overall balance assessment: Needs assistance Sitting-balance support: Feet supported, Single extremity supported Sitting balance-Leahy Scale: Fair   Postural control: Posterior lean, Right lateral lean   Standing balance-Leahy Scale: Zero Standing balance comment: max a                            Cognition Arousal/Alertness: Awake/alert Behavior During Therapy: WFL for tasks assessed/performed Overall Cognitive Status: History of cognitive impairments - at baseline  General Comments: lacks insight into current deficits, eager to d/c home even though wife would have difficulty assisting him        Exercises      General Comments        Pertinent Vitals/Pain Pain Assessment Pain Assessment: Faces Faces Pain Scale: No hurt Pain Intervention(s): Monitored during session    Home Living                          Prior Function             PT Goals (current goals can now be found in the care plan section) Acute Rehab PT Goals PT Goal Formulation: With patient/family Time For Goal Achievement: 06/12/22 Potential to Achieve Goals: Good Progress towards PT goals: Progressing toward goals    Frequency    Min 2X/week      PT Plan Current plan remains appropriate    Co-evaluation              AM-PAC PT "6 Clicks" Mobility   Outcome Measure  Help needed turning from your back to your side while in a flat bed without using bedrails?: A Lot Help needed moving from lying on your back to sitting on the side of a flat bed without using bedrails?: A Lot Help needed moving to and from a bed to a chair (including a wheelchair)?: Total Help needed standing up from a chair using your arms (e.g., wheelchair or bedside chair)?: Total Help needed to walk in hospital room?: Total Help needed climbing 3-5 steps with a railing? : Total 6 Click Score: 8    End of Session Equipment Utilized During Treatment: Gait belt Activity Tolerance: Patient tolerated treatment well;Patient limited by fatigue Patient left: in bed;with call bell/phone within reach;with bed alarm set;with family/visitor present Nurse Communication: Mobility status PT Visit Diagnosis: Other abnormalities of gait and mobility (R26.89)     Time: 5997-7414 PT Time Calculation (min) (ACUTE ONLY): 21 min  Charges:  $Therapeutic Activity: 8-22 mins                     Stacie Glaze, PT DPT Acute Rehabilitation Services Pager [phone]  Office [phone]    Roxine Caddy E Ruffin Pyo 06/01/2022, 2:58 PM

## 2022-06-02 ENCOUNTER — Ambulatory Visit: Payer: PPO | Admitting: Occupational Therapy

## 2022-06-02 LAB — CULTURE, BLOOD (ROUTINE X 2)
Culture: NO GROWTH
Special Requests: ADEQUATE

## 2022-06-15 ENCOUNTER — Encounter: Payer: Self-pay | Admitting: Student

## 2022-06-15 ENCOUNTER — Ambulatory Visit (INDEPENDENT_AMBULATORY_CARE_PROVIDER_SITE_OTHER): Payer: PPO | Admitting: Student

## 2022-06-15 ENCOUNTER — Telehealth: Payer: Self-pay | Admitting: *Deleted

## 2022-06-15 ENCOUNTER — Other Ambulatory Visit: Payer: Self-pay

## 2022-06-15 VITALS — BP 126/85 | HR 80 | Temp 98.1°F | Resp 28 | Ht 72.0 in

## 2022-06-15 DIAGNOSIS — I10 Essential (primary) hypertension: Secondary | ICD-10-CM | POA: Diagnosis not present

## 2022-06-15 DIAGNOSIS — F17210 Nicotine dependence, cigarettes, uncomplicated: Secondary | ICD-10-CM

## 2022-06-15 DIAGNOSIS — Z23 Encounter for immunization: Secondary | ICD-10-CM | POA: Diagnosis not present

## 2022-06-15 DIAGNOSIS — G8110 Spastic hemiplegia affecting unspecified side: Secondary | ICD-10-CM | POA: Diagnosis not present

## 2022-06-15 DIAGNOSIS — I2510 Atherosclerotic heart disease of native coronary artery without angina pectoris: Secondary | ICD-10-CM | POA: Diagnosis not present

## 2022-06-15 DIAGNOSIS — R899 Unspecified abnormal finding in specimens from other organs, systems and tissues: Secondary | ICD-10-CM | POA: Insufficient documentation

## 2022-06-15 DIAGNOSIS — J329 Chronic sinusitis, unspecified: Secondary | ICD-10-CM

## 2022-06-15 DIAGNOSIS — F1090 Alcohol use, unspecified, uncomplicated: Secondary | ICD-10-CM | POA: Diagnosis not present

## 2022-06-15 MED ORDER — AMLODIPINE-OLMESARTAN 10-40 MG PO TABS: 1.0000 | ORAL_TABLET | Freq: Every day | ORAL | 2 refills | Status: DC

## 2022-06-15 MED ORDER — FLUTICASONE PROPIONATE 50 MCG/ACT NA SUSP: 1.0000 | Freq: Every day | NASAL | 2 refills | Status: AC

## 2022-06-15 NOTE — Telephone Encounter (Signed)
Call from [place] at [facility] - stated pt was seen today and unable to find paperwork(AVS); wanted to know if any changes were done. I told her I will fax AVS to her; fax# given [phone].  AVS sent - confirmation received.

## 2022-06-15 NOTE — Assessment & Plan Note (Signed)
Blood pressure 126/85 today.  Tolerating current regimen of amlodipine-olmesartan 10-40 mg without side effects.  Will refill this medicine today.  BMP ordered.

## 2022-06-15 NOTE — Patient Instructions (Signed)
Return 1 month to ensure smooth transition back home from rehab.   I will call you with the results of the following laboratory test(s):  Lab Orders         BMP8+Anion Gap       Expect a call from the following department(s):  Referral Orders         Ambulatory referral to Physical Therapy         Ambulatory referral to [place]     Please call our clinic at [phone] Monday through Friday from 9 am to 4 pm if you have questions or concerns about your health. If after hours or on the weekend, call the main hospital number and ask for the Internal Medicine Resident On-Call. If you need medication refills, please notify your pharmacy one week in advance and they will send [place] a request.   Best, Nani Gasser, [place]

## 2022-06-15 NOTE — Assessment & Plan Note (Signed)
Patient discharged from hospital to [facility] for rehabilitation.  He and family are dissatisfied with the care that he is receiving there.  They will bring the patient back home.  My impression is that this patient will be well cared for at home by family.  I feel he would benefit from continued therapy at home.  I also feel he would benefit from home health services evaluation to give family the assistance they need to manage this patient's chronic condition of spastic hemiplegia secondary to remote CVA that has resulted in complication of stage I pressure injury to right lateral heel.  I have ordered ambulatory referrals to physical therapy and home health services.
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
## 2022-06-15 NOTE — Assessment & Plan Note (Signed)
Patient with rhinorrhea and postnasal drip on exam.  He did not initially report concerns about this but does acknowledge that he has lingering sore throat and cough that he assumes is from recent COVID infection.  He also suffers from seasonal allergies.  Will try a course of intranasal fluticasone.

## 2022-06-15 NOTE — Assessment & Plan Note (Signed)
Talk to patient and his wife about decreasing alcohol use.  I recommend cutting back from 3-4 drinks per day to 1-2 drinks per day, with the eventual goal of total cessation of alcohol use.  Although patient did have mild elevation to AST during his last hospitalization, this was trending down by day of discharge.  He is without signs or symptoms of disease.  For now he will try to decrease alcohol use without occasion assistance.  His wife is also on board with this plan.

## 2022-06-15 NOTE — Assessment & Plan Note (Signed)
Patient's recent hospitalization was notable for hypokalemia and an elevated gamma gap during routine laboratory testing.  Hypokalemia had improved but not totally resolved prior to day of discharge.  The gamma gap was also closing.  The latter was thought to be secondary to acute COVID infection rather than MGUS or multiple myeloma.  I will repeat a BMP today to check for resolution of hypokalemia.

## 2022-06-16 DIAGNOSIS — I639 Cerebral infarction, unspecified: Secondary | ICD-10-CM | POA: Diagnosis not present

## 2022-06-16 DIAGNOSIS — R7989 Other specified abnormal findings of blood chemistry: Secondary | ICD-10-CM | POA: Diagnosis not present

## 2022-06-16 DIAGNOSIS — G40909 Epilepsy, unspecified, not intractable, without status epilepticus: Secondary | ICD-10-CM | POA: Diagnosis not present

## 2022-06-16 DIAGNOSIS — I10 Essential (primary) hypertension: Secondary | ICD-10-CM | POA: Diagnosis not present

## 2022-06-16 DIAGNOSIS — E785 Hyperlipidemia, unspecified: Secondary | ICD-10-CM | POA: Diagnosis not present

## 2022-06-16 LAB — BMP8+ANION GAP
Anion Gap: 15 mmol/L (ref 10.0–18.0)
BUN/Creatinine Ratio: 15 (ref 9–20)
BUN: 11 mg/dL (ref 6–24)
CO2: 21 mmol/L (ref 20–29)
Calcium: 9.5 mg/dL (ref 8.7–10.2)
Chloride: 103 mmol/L (ref 96–106)
Creatinine, Ser: 0.74 mg/dL — ABNORMAL LOW (ref 0.76–1.27)
Glucose: 84 mg/dL (ref 70–99)
Potassium: 4.3 mmol/L (ref 3.5–5.2)
Sodium: 139 mmol/L (ref 134–144)
eGFR: 104 mL/min/{1.73_m2} (ref >59)

## 2022-06-17 DIAGNOSIS — R3981 Functional urinary incontinence: Secondary | ICD-10-CM | POA: Diagnosis not present

## 2022-06-17 DIAGNOSIS — G40909 Epilepsy, unspecified, not intractable, without status epilepticus: Secondary | ICD-10-CM | POA: Diagnosis not present

## 2022-06-17 DIAGNOSIS — I10 Essential (primary) hypertension: Secondary | ICD-10-CM | POA: Diagnosis not present

## 2022-06-17 DIAGNOSIS — G8190 Hemiplegia, unspecified affecting unspecified side: Secondary | ICD-10-CM | POA: Diagnosis not present

## 2022-06-17 DIAGNOSIS — Z7409 Other reduced mobility: Secondary | ICD-10-CM | POA: Diagnosis not present

## 2022-06-18 NOTE — Progress Notes (Signed)
Internal Medicine Clinic Attending  Case discussed with Dr. McLendon  At the time of the visit.  We reviewed the resident's history and exam and pertinent patient test results.  I agree with the assessment, diagnosis, and plan of care documented in the resident's note.  

## 2022-06-24 ENCOUNTER — Ambulatory Visit: Payer: PPO | Admitting: Physical Medicine & Rehabilitation

## 2022-06-24 DIAGNOSIS — E870 Hyperosmolality and hypernatremia: Secondary | ICD-10-CM | POA: Diagnosis not present

## 2022-06-24 DIAGNOSIS — L8996 Pressure-induced deep tissue damage of unspecified site: Secondary | ICD-10-CM | POA: Diagnosis not present

## 2022-06-24 DIAGNOSIS — F17200 Nicotine dependence, unspecified, uncomplicated: Secondary | ICD-10-CM | POA: Diagnosis not present

## 2022-06-24 DIAGNOSIS — G9341 Metabolic encephalopathy: Secondary | ICD-10-CM | POA: Diagnosis not present

## 2022-06-24 DIAGNOSIS — I10 Essential (primary) hypertension: Secondary | ICD-10-CM | POA: Diagnosis not present

## 2022-06-24 DIAGNOSIS — M62421 Contracture of muscle, right upper arm: Secondary | ICD-10-CM | POA: Diagnosis not present

## 2022-06-24 DIAGNOSIS — M6281 Muscle weakness (generalized): Secondary | ICD-10-CM | POA: Diagnosis not present

## 2022-06-24 DIAGNOSIS — G8111 Spastic hemiplegia affecting right dominant side: Secondary | ICD-10-CM | POA: Diagnosis not present

## 2022-06-24 DIAGNOSIS — G40901 Epilepsy, unspecified, not intractable, with status epilepticus: Secondary | ICD-10-CM | POA: Diagnosis not present

## 2022-06-24 DIAGNOSIS — F1010 Alcohol abuse, uncomplicated: Secondary | ICD-10-CM | POA: Diagnosis not present

## 2022-06-25 ENCOUNTER — Telehealth: Payer: Self-pay

## 2022-06-25 NOTE — Telephone Encounter (Signed)
Christy with [facility] requesting to speak with a nurse about wound. Please call pt back.

## 2022-06-25 NOTE — Telephone Encounter (Addendum)
RTC to [facility].  Dark spot on Lateral Aspect of patient's left foot concerning.  Toe Nails are long.  Requesting referral t Podiatry to assess foot and to tim toe nails..  Also problem with Divalproex medication patient's wife had only been giving patient 1 tablet every 8 hours.  Directions are fr 3 tablets every 8 hours.  Alyse Low worked with wife on dosing.  Explained to wife to do 3 tablets every 8 hours.  Chirsty can be reached at [phone].

## 2022-07-06 ENCOUNTER — Other Ambulatory Visit: Payer: Self-pay | Admitting: Adult Health

## 2022-07-15 ENCOUNTER — Encounter: Payer: PPO | Attending: Physical Medicine & Rehabilitation | Admitting: Physical Medicine & Rehabilitation

## 2022-07-15 ENCOUNTER — Encounter: Payer: Self-pay | Admitting: Physical Medicine & Rehabilitation

## 2022-07-15 VITALS — BP 108/72 | HR 71 | Ht 72.0 in

## 2022-07-15 DIAGNOSIS — G8110 Spastic hemiplegia affecting unspecified side: Secondary | ICD-10-CM | POA: Diagnosis not present
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
## 2022-07-16 ENCOUNTER — Encounter: Payer: PPO | Admitting: Student

## 2022-07-20 DIAGNOSIS — L8996 Pressure-induced deep tissue damage of unspecified site: Secondary | ICD-10-CM | POA: Diagnosis not present

## 2022-07-20 DIAGNOSIS — M62421 Contracture of muscle, right upper arm: Secondary | ICD-10-CM | POA: Diagnosis not present

## 2022-07-20 DIAGNOSIS — F17200 Nicotine dependence, unspecified, uncomplicated: Secondary | ICD-10-CM | POA: Diagnosis not present

## 2022-07-20 DIAGNOSIS — F1010 Alcohol abuse, uncomplicated: Secondary | ICD-10-CM | POA: Diagnosis not present

## 2022-07-20 DIAGNOSIS — I10 Essential (primary) hypertension: Secondary | ICD-10-CM | POA: Diagnosis not present

## 2022-07-20 DIAGNOSIS — G9341 Metabolic encephalopathy: Secondary | ICD-10-CM | POA: Diagnosis not present

## 2022-07-20 DIAGNOSIS — G8111 Spastic hemiplegia affecting right dominant side: Secondary | ICD-10-CM | POA: Diagnosis not present

## 2022-07-20 DIAGNOSIS — M6281 Muscle weakness (generalized): Secondary | ICD-10-CM | POA: Diagnosis not present

## 2022-07-20 DIAGNOSIS — E870 Hyperosmolality and hypernatremia: Secondary | ICD-10-CM | POA: Diagnosis not present

## 2022-07-20 DIAGNOSIS — G40901 Epilepsy, unspecified, not intractable, with status epilepticus: Secondary | ICD-10-CM | POA: Diagnosis not present

## 2022-07-21 ENCOUNTER — Ambulatory Visit (INDEPENDENT_AMBULATORY_CARE_PROVIDER_SITE_OTHER): Payer: PPO | Admitting: Student

## 2022-07-21 ENCOUNTER — Ambulatory Visit (HOSPITAL_COMMUNITY)
Admission: RE | Admit: 2022-07-21 | Discharge: 2022-07-21 | Disposition: A | Discharge status: Home / Self Care | Payer: PPO | Source: Ambulatory Visit | Attending: Vascular Surgery | Admitting: Vascular Surgery

## 2022-07-21 ENCOUNTER — Encounter: Payer: Self-pay | Admitting: Vascular Surgery

## 2022-07-21 ENCOUNTER — Encounter: Payer: Self-pay | Admitting: Student

## 2022-07-21 ENCOUNTER — Other Ambulatory Visit: Payer: Self-pay

## 2022-07-21 ENCOUNTER — Ambulatory Visit (INDEPENDENT_AMBULATORY_CARE_PROVIDER_SITE_OTHER): Payer: PPO | Admitting: Vascular Surgery

## 2022-07-21 VITALS — BP 142/88 | HR 81 | Temp 97.3°F | Resp 20 | Ht 72.0 in | Wt 242.0 lb

## 2022-07-21 VITALS — BP 105/69 | HR 86 | Temp 97.9°F

## 2022-07-21 DIAGNOSIS — L603 Nail dystrophy: Secondary | ICD-10-CM

## 2022-07-21 DIAGNOSIS — I70221 Atherosclerosis of native arteries of extremities with rest pain, right leg: Secondary | ICD-10-CM

## 2022-07-21 DIAGNOSIS — I70298 Other atherosclerosis of native arteries of extremities, other extremity: Secondary | ICD-10-CM | POA: Diagnosis not present

## 2022-07-21 DIAGNOSIS — I739 Peripheral vascular disease, unspecified: Secondary | ICD-10-CM | POA: Diagnosis not present

## 2022-07-21 NOTE — Progress Notes (Signed)
CC: Right foot ulcer and swelling  HPI:  [name] is a 62 y.o. male with PMH as below who presents to clinic accompanied by spouse for evaluation of his right foot. Please see problem based charting for evaluation, assessment and plan.  Past Medical History:  Diagnosis Date   Alcohol abuse    Cocaine abuse ([place]) 2014   Hypertension    Infection of wound due to methicillin resistant Staphylococcus aureus (MRSA)    Seizures ([place]) 07/2015   had first seizures about 6 months after stroke   Status epilepticus ([place]) 07/27/2021   Stroke ([place]) 2014   denies residual on 07/03/2015   Stroke ([place]) 07/02/2015   "now weak on right side; speech problems" (07/03/2015)   Tobacco use     Review of Systems:  Constitutional: Negative for fever or fatigue Eyes: Negative for visual changes Cardiac: Negative for chest pain MSK: Negative for back pain Abdomen: Negative for abdominal pain, constipation or diarrhea Neuro: Positive for chronic right sided weakness, numbness and dysarthria.  Negative for headaches or dizziness.  Physical Exam: General: Pleasant, middle-aged man leaning to his right in a wheelchair. No acute distress. Cardiac: RRR. No murmurs, rubs or gallops. No LE edema Respiratory: Lungs CTAB. No wheezing or crackles.  Decreased breath sounds. Skin: Cool right foot. R foot w/ gangrene of the heel and blister medially. Healing lesion on the lower anterior aspect of the RLE (see media tab).  Extremities: Right foot w/ moderate edema, gangrene of the heel, developing blister on the medial aspect of the foot, pale and dystrophic nails with decreased capillary refill and bluish discoloration of the toes.  Nonpalpable DP and PT pulse, cool to touch distally. L foot with faint DP pulses, dystrophic and pale nails but warm to touch. Radial pulses 2+ and symmetric. Neuro: Alert and oriented. Dysarthric speech. Right spastic hemiparesis. Decreased sensation to the right lower extremity.  Increased tone of the RLE.  Psych: Appropriate mood and affect.  Vitals:   07/21/22 1056  BP: 105/69  Pulse: 86  Temp: 97.9 F (36.6 C)  TempSrc: Oral  SpO2: 97%    Assessment & Plan:   PAD (peripheral artery disease) (HCC) Patient here with with spouse for evaluation of his right foot.  According to spouse, patient's home health nurse was concerned about changes to his right foot and advised her to get the foot evaluated. Patient followed by PM&R and getting weekly PT at home.  He has chronic weakness on the right side from previous stroke however, spouse states they have had difficulty getting patient to stand up to put pressure on the right foot recently. They have also noticed more swelling in the right foot with associated skin changes. Patient denies any pain in the legs but does have decreased sensation to the right lower extremity at baseline. On exam, there is decreased sensation of the R foot, moderate swelling of the right foot up to the ankles with nonpalpable DP and PT pulses as well as decreased capillary refill with toes that are cold to touch. Additionally, he has a gangrene on his right heel, blister medially on the right foot and bluish discoloration of the right toes.  These findings are concerning for possible critical limb ischemia tniabi which showed PAD on the right.  Discussed plan for an urgent referral to vascular surgery for further evaluation and management. Last lipid panel 1 year ago showed LDL 47.  Plan: -Urgent referral to vascular surgery, scheduled for appointment at  3:40 PM today -Continue aspirin 81 mg daily -Continue atorvastatin 20 mg daily  Nail dystrophy Patient here for evaluation of his right foot. On exam, patient has dystrophic toenails, and gangrene of the right heel. He also has a healing wound on the lower anterior aspect of the right leg and the blister on the medial aspect of the right foot. Bilateral feet with thick and elongated  nails. -Referral to podiatry   See Encounters Tab for problem based charting.  Patient discussed with Dr. Lorenz Coaster, MD, MPH

## 2022-07-21 NOTE — Assessment & Plan Note (Signed)
Patient here for evaluation of his right foot. On exam, patient has dystrophic toenails, and gangrene of the right heel. He also has a healing wound on the lower anterior aspect of the right leg and the blister on the medial aspect of the right foot. Bilateral feet with thick and elongated nails. -Referral to podiatry
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
## 2022-07-21 NOTE — Assessment & Plan Note (Signed)
Patient here with with spouse for evaluation of his right foot.  According to spouse, patient's home health nurse was concerned about changes to his right foot and advised her to get the foot evaluated. Patient followed by PM&R and getting weekly PT at home.  He has chronic weakness on the right side from previous stroke however, spouse states they have had difficulty getting patient to stand up to put pressure on the right foot recently. They have also noticed more swelling in the right foot with associated skin changes. Patient denies any pain in the legs but does have decreased sensation to the right lower extremity at baseline. On exam, there is decreased sensation of the R foot, moderate swelling of the right foot up to the ankles with nonpalpable DP and PT pulses as well as decreased capillary refill with toes that are cold to touch. Additionally, he has a gangrene on his right heel, blister medially on the right foot and bluish discoloration of the right toes.  These findings are concerning for possible critical limb ischemia tniabi which showed PAD on the right.  Discussed plan for an urgent referral to vascular surgery for further evaluation and management. Last lipid panel 1 year ago showed LDL 47.  Plan: -Urgent referral to vascular surgery, scheduled for appointment at 3:40 PM today -Continue aspirin 81 mg daily -Continue atorvastatin 20 mg daily

## 2022-07-21 NOTE — Patient Instructions (Signed)
Thank you, [name] for allowing [place] to provide your care today. Today we discussed your leg swelling and skin changes.  I am unable to feel a pulse on your right foot and the ABI is concerning for possible critical limb ischemia.  I have put in an urgent referral to vascular surgery who plans to see you later today. Continue taking your aspirin and Lipitor daily.  My Chart Access: [email or web address]  Please follow-up in 1 month  Please make sure to arrive 15 minutes prior to your next appointment. If you arrive late, you may be asked to reschedule.    We look forward to seeing you next time. Please call our clinic at [phone] if you have any questions or concerns. The best time to call is Monday-Friday from 9am-4pm, but there is someone available 24/7. If after hours or the weekend, call the main hospital number and ask for the Internal Medicine Resident On-Call. If you need medication refills, please notify your pharmacy one week in advance and they will send [place] a request.   Thank you for letting us take part in your care. [name]!  Lacinda Axon, MD 07/21/2022, 11:37 AM IM Resident, PGY-3 [facility] 41:10

## 2022-07-22 ENCOUNTER — Other Ambulatory Visit: Payer: Self-pay | Admitting: Adult Health

## 2022-07-27 ENCOUNTER — Other Ambulatory Visit: Payer: Self-pay

## 2022-07-27 ENCOUNTER — Inpatient Hospital Stay (HOSPITAL_COMMUNITY)
Admission: EM | Admit: 2022-07-27 | Discharge: 2022-08-07 | DRG: 240 | Disposition: A | Discharge status: Skilled Nursing Facility | Payer: PPO | Attending: Internal Medicine | Admitting: Internal Medicine

## 2022-07-27 ENCOUNTER — Encounter (HOSPITAL_COMMUNITY): Payer: Self-pay | Admitting: Emergency Medicine

## 2022-07-27 ENCOUNTER — Emergency Department (HOSPITAL_COMMUNITY): Payer: PPO

## 2022-07-27 DIAGNOSIS — R109 Unspecified abdominal pain: Secondary | ICD-10-CM | POA: Diagnosis not present

## 2022-07-27 DIAGNOSIS — Z7982 Long term (current) use of aspirin: Secondary | ICD-10-CM

## 2022-07-27 DIAGNOSIS — F419 Anxiety disorder, unspecified: Secondary | ICD-10-CM | POA: Diagnosis present

## 2022-07-27 DIAGNOSIS — Z79899 Other long term (current) drug therapy: Secondary | ICD-10-CM

## 2022-07-27 DIAGNOSIS — G8110 Spastic hemiplegia affecting unspecified side: Secondary | ICD-10-CM | POA: Diagnosis present

## 2022-07-27 DIAGNOSIS — Z7401 Bed confinement status: Secondary | ICD-10-CM | POA: Diagnosis not present

## 2022-07-27 DIAGNOSIS — Z8249 Family history of ischemic heart disease and other diseases of the circulatory system: Secondary | ICD-10-CM

## 2022-07-27 DIAGNOSIS — E876 Hypokalemia: Secondary | ICD-10-CM | POA: Diagnosis not present

## 2022-07-27 DIAGNOSIS — K5903 Drug induced constipation: Secondary | ICD-10-CM | POA: Diagnosis present

## 2022-07-27 DIAGNOSIS — I70221 Atherosclerosis of native arteries of extremities with rest pain, right leg: Secondary | ICD-10-CM | POA: Diagnosis not present

## 2022-07-27 DIAGNOSIS — R0902 Hypoxemia: Secondary | ICD-10-CM | POA: Diagnosis not present

## 2022-07-27 DIAGNOSIS — U071 COVID-19: Secondary | ICD-10-CM | POA: Diagnosis not present

## 2022-07-27 DIAGNOSIS — G40909 Epilepsy, unspecified, not intractable, without status epilepticus: Secondary | ICD-10-CM | POA: Diagnosis not present

## 2022-07-27 DIAGNOSIS — S78111D Complete traumatic amputation at level between right hip and knee, subsequent encounter: Secondary | ICD-10-CM | POA: Diagnosis not present

## 2022-07-27 DIAGNOSIS — N179 Acute kidney failure, unspecified: Secondary | ICD-10-CM | POA: Diagnosis present

## 2022-07-27 DIAGNOSIS — R410 Disorientation, unspecified: Secondary | ICD-10-CM | POA: Diagnosis present

## 2022-07-27 DIAGNOSIS — R609 Edema, unspecified: Secondary | ICD-10-CM | POA: Diagnosis not present

## 2022-07-27 DIAGNOSIS — F17210 Nicotine dependence, cigarettes, uncomplicated: Secondary | ICD-10-CM | POA: Diagnosis present

## 2022-07-27 DIAGNOSIS — S78111A Complete traumatic amputation at level between right hip and knee, initial encounter: Secondary | ICD-10-CM

## 2022-07-27 DIAGNOSIS — I998 Other disorder of circulatory system: Secondary | ICD-10-CM | POA: Diagnosis not present

## 2022-07-27 DIAGNOSIS — N281 Cyst of kidney, acquired: Secondary | ICD-10-CM | POA: Diagnosis not present

## 2022-07-27 DIAGNOSIS — A419 Sepsis, unspecified organism: Secondary | ICD-10-CM | POA: Insufficient documentation

## 2022-07-27 DIAGNOSIS — Z823 Family history of stroke: Secondary | ICD-10-CM

## 2022-07-27 DIAGNOSIS — R310 Gross hematuria: Secondary | ICD-10-CM | POA: Diagnosis present

## 2022-07-27 DIAGNOSIS — T40605A Adverse effect of unspecified narcotics, initial encounter: Secondary | ICD-10-CM | POA: Diagnosis not present

## 2022-07-27 DIAGNOSIS — R0602 Shortness of breath: Secondary | ICD-10-CM | POA: Diagnosis not present

## 2022-07-27 DIAGNOSIS — I10 Essential (primary) hypertension: Secondary | ICD-10-CM | POA: Diagnosis not present

## 2022-07-27 DIAGNOSIS — J9811 Atelectasis: Secondary | ICD-10-CM | POA: Diagnosis not present

## 2022-07-27 DIAGNOSIS — L03115 Cellulitis of right lower limb: Secondary | ICD-10-CM | POA: Diagnosis not present

## 2022-07-27 DIAGNOSIS — I739 Peripheral vascular disease, unspecified: Secondary | ICD-10-CM | POA: Diagnosis present

## 2022-07-27 DIAGNOSIS — Z89611 Acquired absence of right leg above knee: Secondary | ICD-10-CM | POA: Diagnosis not present

## 2022-07-27 DIAGNOSIS — Z993 Dependence on wheelchair: Secondary | ICD-10-CM | POA: Diagnosis not present

## 2022-07-27 DIAGNOSIS — I69354 Hemiplegia and hemiparesis following cerebral infarction affecting left non-dominant side: Secondary | ICD-10-CM

## 2022-07-27 DIAGNOSIS — G9349 Other encephalopathy: Secondary | ICD-10-CM | POA: Diagnosis present

## 2022-07-27 DIAGNOSIS — Z8616 Personal history of COVID-19: Secondary | ICD-10-CM

## 2022-07-27 DIAGNOSIS — G9340 Encephalopathy, unspecified: Secondary | ICD-10-CM | POA: Diagnosis not present

## 2022-07-27 DIAGNOSIS — G8190 Hemiplegia, unspecified affecting unspecified side: Secondary | ICD-10-CM | POA: Diagnosis not present

## 2022-07-27 DIAGNOSIS — F05 Delirium due to known physiological condition: Secondary | ICD-10-CM | POA: Diagnosis present

## 2022-07-27 DIAGNOSIS — R4182 Altered mental status, unspecified: Secondary | ICD-10-CM | POA: Diagnosis not present

## 2022-07-27 DIAGNOSIS — M6281 Muscle weakness (generalized): Secondary | ICD-10-CM | POA: Diagnosis not present

## 2022-07-27 DIAGNOSIS — I70261 Atherosclerosis of native arteries of extremities with gangrene, right leg: Secondary | ICD-10-CM | POA: Diagnosis not present

## 2022-07-27 DIAGNOSIS — Z4781 Encounter for orthopedic aftercare following surgical amputation: Secondary | ICD-10-CM | POA: Diagnosis not present

## 2022-07-27 DIAGNOSIS — F17200 Nicotine dependence, unspecified, uncomplicated: Secondary | ICD-10-CM | POA: Diagnosis not present

## 2022-07-27 DIAGNOSIS — R319 Hematuria, unspecified: Secondary | ICD-10-CM | POA: Diagnosis not present

## 2022-07-27 DIAGNOSIS — D649 Anemia, unspecified: Secondary | ICD-10-CM | POA: Diagnosis not present

## 2022-07-27 DIAGNOSIS — R531 Weakness: Secondary | ICD-10-CM | POA: Diagnosis not present

## 2022-07-27 DIAGNOSIS — R488 Other symbolic dysfunctions: Secondary | ICD-10-CM | POA: Diagnosis not present

## 2022-07-27 DIAGNOSIS — L03119 Cellulitis of unspecified part of limb: Secondary | ICD-10-CM | POA: Diagnosis present

## 2022-07-27 DIAGNOSIS — Z8673 Personal history of transient ischemic attack (TIA), and cerebral infarction without residual deficits: Secondary | ICD-10-CM

## 2022-07-27 LAB — TROPONIN I (HIGH SENSITIVITY)
Troponin I (High Sensitivity): 7 ng/L (ref <18)
Troponin I (High Sensitivity): 5 ng/L (ref <18)

## 2022-07-27 LAB — CBC WITH DIFFERENTIAL/PLATELET
Abs Immature Granulocytes: 0.04 10*3/uL (ref 0.00–0.07)
Basophils Absolute: 0 10*3/uL (ref 0.0–0.1)
Basophils Relative: 0 %
Eosinophils Absolute: 0 10*3/uL (ref 0.0–0.5)
Eosinophils Relative: 0 %
HCT: 47 % (ref 39.0–52.0)
Hemoglobin: 15.7 g/dL (ref 13.0–17.0)
Immature Granulocytes: 0 %
Lymphocytes Relative: 33 %
Lymphs Abs: 4.5 10*3/uL — ABNORMAL HIGH (ref 0.7–4.0)
MCH: 32.4 pg (ref 26.0–34.0)
MCHC: 33.4 g/dL (ref 30.0–36.0)
MCV: 97.1 fL (ref 80.0–100.0)
Monocytes Absolute: 1.7 10*3/uL — ABNORMAL HIGH (ref 0.1–1.0)
Monocytes Relative: 13 %
Neutro Abs: 7.3 10*3/uL (ref 1.7–7.7)
Neutrophils Relative %: 54 %
Platelets: 349 10*3/uL (ref 150–400)
RBC: 4.84 MIL/uL (ref 4.22–5.81)
RDW: 14 % (ref 11.5–15.5)
WBC: 13.6 10*3/uL — ABNORMAL HIGH (ref 4.0–10.5)
nRBC: 0 % (ref 0.0–0.2)

## 2022-07-27 LAB — COMPREHENSIVE METABOLIC PANEL
ALT: 18 U/L (ref 0–44)
AST: 35 U/L (ref 15–41)
Albumin: 2.8 g/dL — ABNORMAL LOW (ref 3.5–5.0)
Alkaline Phosphatase: 63 U/L (ref 38–126)
Anion gap: 10 (ref 5–15)
BUN: 25 mg/dL — ABNORMAL HIGH (ref 6–20)
CO2: 28 mmol/L (ref 22–32)
Calcium: 9.6 mg/dL (ref 8.9–10.3)
Chloride: 101 mmol/L (ref 98–111)
Creatinine, Ser: 1.3 mg/dL — ABNORMAL HIGH (ref 0.61–1.24)
GFR, Estimated: >60 mL/min (ref >60)
Glucose, Bld: 107 mg/dL — ABNORMAL HIGH (ref 70–99)
Potassium: 3.3 mmol/L — ABNORMAL LOW (ref 3.5–5.1)
Sodium: 139 mmol/L (ref 135–145)
Total Bilirubin: 0.8 mg/dL (ref 0.3–1.2)
Total Protein: 8.6 g/dL — ABNORMAL HIGH (ref 6.5–8.1)

## 2022-07-27 LAB — URINALYSIS, ROUTINE W REFLEX MICROSCOPIC
Bacteria, UA: NONE SEEN
Bilirubin Urine: NEGATIVE
Glucose, UA: NEGATIVE mg/dL
Ketones, ur: 20 mg/dL — AB
Leukocytes,Ua: NEGATIVE
Nitrite: NEGATIVE
Protein, ur: NEGATIVE mg/dL
Specific Gravity, Urine: 1.029 (ref 1.005–1.030)
pH: 5 (ref 5.0–8.0)

## 2022-07-27 LAB — ETHANOL: Alcohol, Ethyl (B): <10 mg/dL (ref <10)

## 2022-07-27 LAB — CBC
HCT: 43 % (ref 39.0–52.0)
Hemoglobin: 14.1 g/dL (ref 13.0–17.0)
MCH: 32 pg (ref 26.0–34.0)
MCHC: 32.8 g/dL (ref 30.0–36.0)
MCV: 97.5 fL (ref 80.0–100.0)
Platelets: 324 10*3/uL (ref 150–400)
RBC: 4.41 MIL/uL (ref 4.22–5.81)
RDW: 13.9 % (ref 11.5–15.5)
WBC: 11.4 10*3/uL — ABNORMAL HIGH (ref 4.0–10.5)
nRBC: 0 % (ref 0.0–0.2)

## 2022-07-27 LAB — RAPID URINE DRUG SCREEN, HOSP PERFORMED
Amphetamines: NOT DETECTED
Barbiturates: NOT DETECTED
Benzodiazepines: NOT DETECTED
Cocaine: NOT DETECTED
Opiates: NOT DETECTED
Tetrahydrocannabinol: NOT DETECTED

## 2022-07-27 LAB — BLOOD GAS, VENOUS
Acid-Base Excess: 4.4 mmol/L — ABNORMAL HIGH (ref 0.0–2.0)
Bicarbonate: 30.8 mmol/L — ABNORMAL HIGH (ref 20.0–28.0)
O2 Saturation: 62.4 %
Patient temperature: 37
pCO2, Ven: 52 mmHg (ref 44–60)
pH, Ven: 7.38 (ref 7.25–7.43)
pO2, Ven: 40 mmHg (ref 32–45)

## 2022-07-27 LAB — RESP PANEL BY RT-PCR (RSV, FLU A&B, COVID)  RVPGX2
Influenza A by PCR: NEGATIVE
Influenza B by PCR: NEGATIVE
Resp Syncytial Virus by PCR: NEGATIVE
SARS Coronavirus 2 by RT PCR: POSITIVE — AB

## 2022-07-27 LAB — CREATININE, SERUM
Creatinine, Ser: 0.97 mg/dL (ref 0.61–1.24)
GFR, Estimated: >60 mL/min (ref >60)

## 2022-07-27 LAB — VALPROIC ACID LEVEL: Valproic Acid Lvl: 65 ug/mL (ref 50.0–100.0)

## 2022-07-27 LAB — LACTIC ACID, PLASMA: Lactic Acid, Venous: 1.3 mmol/L (ref 0.5–1.9)

## 2022-07-27 LAB — AMMONIA: Ammonia: 15 umol/L (ref 9–35)

## 2022-07-27 LAB — PROTIME-INR
INR: 1.2 (ref 0.8–1.2)
Prothrombin Time: 14.9 seconds (ref 11.4–15.2)

## 2022-07-27 LAB — CK: Total CK: 817 U/L — ABNORMAL HIGH (ref 49–397)

## 2022-07-27 LAB — LIPASE, BLOOD: Lipase: 25 U/L (ref 11–51)

## 2022-07-27 MED ORDER — IOHEXOL 350 MG/ML SOLN
75.0000 mL | Freq: Once | INTRAVENOUS | Status: AC | PRN
  Administered 2022-07-27: 75 mL via INTRAVENOUS

## 2022-07-27 MED ORDER — HEPARIN SODIUM (PORCINE) 5000 UNIT/ML IJ SOLN
5000.0000 [IU] | Freq: Three times a day (TID) | INTRAMUSCULAR | Status: AC
  Administered 2022-07-27: 5000 [IU] via SUBCUTANEOUS
  Filled 2022-07-27: qty 1

## 2022-07-27 MED ORDER — LACTATED RINGERS IV SOLN: INTRAVENOUS | Status: DC

## 2022-07-27 MED ORDER — ATORVASTATIN CALCIUM 10 MG PO TABS
20.0000 mg | ORAL_TABLET | Freq: Every day | ORAL | Status: DC
  Administered 2022-07-28 – 2022-08-06 (×10): 20 mg via ORAL
  Filled 2022-07-27 (×11): qty 2

## 2022-07-27 MED ORDER — VANCOMYCIN VARIABLE DOSE PER UNSTABLE RENAL FUNCTION (PHARMACIST DOSING): Status: DC

## 2022-07-27 MED ORDER — ACETAMINOPHEN 500 MG PO TABS
1000.0000 mg | ORAL_TABLET | Freq: Three times a day (TID) | ORAL | Status: DC
  Administered 2022-07-27 – 2022-07-30 (×7): 1000 mg via ORAL
  Filled 2022-07-27 (×7): qty 2

## 2022-07-27 MED ORDER — OXYCODONE HCL 5 MG PO TABS
5.0000 mg | ORAL_TABLET | Freq: Four times a day (QID) | ORAL | Status: DC | PRN
  Administered 2022-07-29: 5 mg via ORAL
  Filled 2022-07-27: qty 1

## 2022-07-27 MED ORDER — SODIUM CHLORIDE 0.9 % IV SOLN
2.0000 g | Freq: Three times a day (TID) | INTRAVENOUS | Status: DC
  Administered 2022-07-27 – 2022-07-31 (×11): 2 g via INTRAVENOUS
  Filled 2022-07-27 (×11): qty 12.5

## 2022-07-27 MED ORDER — LACOSAMIDE 50 MG PO TABS
50.0000 mg | ORAL_TABLET | Freq: Two times a day (BID) | ORAL | Status: DC
  Administered 2022-07-27 – 2022-08-07 (×22): 50 mg via ORAL
  Filled 2022-07-27 (×22): qty 1

## 2022-07-27 MED ORDER — HEPARIN SODIUM (PORCINE) 5000 UNIT/ML IJ SOLN: 5000.0000 [IU] | Freq: Three times a day (TID) | INTRAMUSCULAR | Status: DC

## 2022-07-27 MED ORDER — DIVALPROEX SODIUM ER 500 MG PO TB24
1500.0000 mg | ORAL_TABLET | Freq: Every day | ORAL | Status: DC
  Administered 2022-07-27 – 2022-08-06 (×11): 1500 mg via ORAL
  Filled 2022-07-27 (×12): qty 3

## 2022-07-27 MED ORDER — VANCOMYCIN HCL 2000 MG/400ML IV SOLN
2000.0000 mg | Freq: Once | INTRAVENOUS | Status: AC
  Administered 2022-07-27: 2000 mg via INTRAVENOUS
  Filled 2022-07-27: qty 400

## 2022-07-27 MED ORDER — POTASSIUM CHLORIDE 10 MEQ/100ML IV SOLN
10.0000 meq | INTRAVENOUS | Status: AC
  Administered 2022-07-28: 10 meq via INTRAVENOUS
  Filled 2022-07-27: qty 100

## 2022-07-27 MED ORDER — SODIUM CHLORIDE 0.9 % IV BOLUS
1000.0000 mL | Freq: Once | INTRAVENOUS | Status: AC
  Administered 2022-07-27: 1000 mL via INTRAVENOUS

## 2022-07-27 MED ORDER — LEVETIRACETAM 500 MG PO TABS
1500.0000 mg | ORAL_TABLET | Freq: Two times a day (BID) | ORAL | Status: DC
  Administered 2022-07-27 – 2022-08-07 (×22): 1500 mg via ORAL
  Filled 2022-07-27 (×22): qty 3

## 2022-07-27 NOTE — Progress Notes (Signed)
Internal Medicine Clinic Attending  Case discussed with the resident at the time of the visit.  We reviewed the resident's history and exam and pertinent patient test results.  I agree with the assessment, diagnosis, and plan of care documented in the resident's note.  

## 2022-07-27 NOTE — ED Triage Notes (Signed)
Pt brought in by EMS from home, family reports AMS x1 week with dark colored urine. Of note pt is scheduled to have a R AKA in 2 weeks. Pt is Aox2 at baseline but per family is even more confused than normal. Pt uses a wheelchair at home.

## 2022-07-27 NOTE — Hospital Course (Addendum)
Pain is pretty well controlled. Not complainng  Breathing okay. Wife thinks he is still a little confused. She says he is typically more oriented at home and can usually remember events from day before. He is not able to tel [place] what happened yesterday.  Not able to tell us what we just talked about. Has difficulty remembering things just told to him.  They did not like their experience at [facility] Wife reports that he smoked for a long time, but has not diagnosed lung problems. Recent lung scan without obvious disease to explain o2 requirement as well.    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  Blood cultures negative at 3 days.  Will plan to take off antibiotics over the weekend. - Monitor blood cultures - Continue Vanco and cefepime - Tylenol 1 g 3 times daily - Oxycodone 5 mg every 6 hours as needed - PT recommends acute inpatient rehab, we will begin the referral process   Pre-renal AKI, resolved Patient presented with creatinine of 1.30 up from baseline of below 1. Responded well to IV fluids.   Hypokalemia, resolved Potassium 3.3 on admission and stable after supplementation.   Peripheral arterial disease Patient has critical limb ischemia of his right lower extremity as above but also has diminished/absent pulses in his left lower extremity.  We will continue to work with vascular to see if he needs any further studies of his left lower extremity or remaining vasculature after addressing his acute presentation. 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  Later  that year in 05/2016 he had his first seizure. Patient did present altered as above but does not appear to have any new focal neurologic deficits and had no evidence of intracranial hemorrhage on CT scan.  He also has been taking his antiepileptics consistently at home and has not had a seizure in at least a year. - Continue home Depakote 1500 mg daily, Vimpat 50 mg twice daily, and Keppra 1500 mg twice daily   Normocytic anemia Patient has developed anemia after presentation with hemoglobin of 14 down to 11.6 here. Today up to 12.2.  He has gotten a good amount of fluids and is in an acute state of inflammation.  Iron studies did not show any iron deficiency.  Last colonoscopy in 2020 showed 1 small tubular adenoma.  We will continue to monitor this.   Hypertension Blood pressure has been elevated since admission with systolics in the [id number] overnight.  He is on amlodipine/olmesartan 10/40 mg daily at home. 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 Radiology concerned about hemorrhagic cyst vs solid neoplasm and recommended [place] for further evaluation. Today his urine reservoir has very dark urine with possible sediment. He also has a normocytic anemia that is new this admission and slightly worsened after R AKA. We will get a UA and get a renal US if persistently abnormal.  - UA   2/18: Evaluated at bedside, awake  and alert. Says "ow" when lightly touching R leg,  endorses pain. Reports adequate appetite and sleep. Tachycardic on monitor, at 105-107 bpm. Reports pain when palpating ***. Reassured patient has PRN medications are available for pain. Unsure if family visiting today, reports he saw wife yesterday.   2/19: Patient wincing in pain when abdomen palpated by nursing staff. Patient reports some mild  abdominal pain. Patient has spasm of his right leg during examination. Family reports that he is eating and drinking well.   Plan: -scheduled senna -restart lovenox (maybe ask surgery if okay to restart?) -IV LR 125 mL/hr for 8 hours  ------------------ 2/21: Patient reports he did not have a bowel movement yesterday and does not feel as though he needs to have  bowel movement today. He is complaining of abdominal pain today. He reports having nausea with eating recently. He is hungry now and not feeling nauseous.   Plan: - Ordered KUB  2/24: Patient is feeling well. Still having some moderate pain today. Had a bowel movement yesterday. No new complaints today. Discussed plan to control his pain today. Discussed pending SNF.

## 2022-07-27 NOTE — Telephone Encounter (Signed)
Call placed to [name] and advised she bring patient to ED now. States she will contact his wife and bring him to ED.

## 2022-07-27 NOTE — ED Notes (Signed)
Called [facility] for dinner tray. Normal house tray should be here within the hour (by 8:20 PM)

## 2022-07-27 NOTE — ED Provider Notes (Signed)
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   diphenhydrAMINE (BENADRYL) 12.5 MG/5ML elixir 12.5-25 mg   senna-docusate (Senokot-S) tablet 1 tablet   bisacodyl (DULCOLAX) EC tablet 5 mg   DISCONTD: acetaminophen (TYLENOL) tablet 500 mg   DISCONTD: acetaminophen (TYLENOL) tablet 1,000 mg   oxyCODONE (Oxy IR/ROXICODONE) immediate release  tablet 5 mg   acetaminophen (TYLENOL) tablet 1,000 mg    -I have reviewed the patients home medicines and have made adjustments as needed   Consultations Obtained: na   Cardiac Monitoring: The patient was maintained on a cardiac monitor.  I personally viewed and interpreted the cardiac monitored  which showed an underlying rhythm of: nsr  Social Determinants of Health:  Diagnosis or treatment significantly limited by social determinants of health: polysubstance abuse   Reevaluation: After the interventions noted above, I reevaluated the patient and found that they have stayed the same  Co morbidities that complicate the patient evaluation  Past Medical History:  Diagnosis Date   Alcohol abuse    Cocaine abuse ([place]) 2014   Hypertension    Infection of wound due to methicillin resistant Staphylococcus aureus (MRSA)    Seizures ([place]) 07/2015   had first seizures about 6 months after stroke   Status epilepticus ([place]) 07/27/2021   Stroke ([place]) 2014   denies residual on 07/03/2015   Stroke ([place]) 07/02/2015   "now weak on right side; speech problems" (07/03/2015)   Tobacco use       Dispostion: Disposition decision including need for hospitalization was considered, and patient admitted to the hospital. 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  Despite best efforts to proofread,  errors can occur which can change the documentation meaning.    Wynona Dove A, DO 07/31/22 1103

## 2022-07-27 NOTE — Progress Notes (Addendum)
Pharmacy Antibiotic Note  Gerald [name] is a 62 y.o. male admitted on 07/27/2022 with concern for worsening foot infection. Pharmacy has been consulted for vancomycin and cefepime dosing. WBC elevated at 13.6. Scr above baseline at 1.30 (BL~0.7). Patient afebrile.   Plan: Vancomycin 2,000 mg IV x 1, then variable dosing given unstable renal function with random level as needed  Cefepime 2g IV q8h  F/u C&S, renal function, LOT and de-escalate as able    Height: 6' (182.9 cm) IBW/kg (Calculated) : 77.6  Temp (24hrs), Avg:97.9 F (36.6 C), Min:97.7 F (36.5 C), Max:98 F (36.7 C)  Recent Labs  Lab 07/27/22 1215  WBC 13.6*  CREATININE 1.30*  LATICACIDVEN 1.3    Estimated Creatinine Clearance: 78.3 mL/min (A) (by C-G formula based on SCr of 1.3 mg/dL (H)).    No Known Allergies  Antimicrobials this admission: 2/13 cefepime >>  2/13 vancomycin >>   Microbiology results: 2/13 BCx: sent 2/13 RVP: covid-19 pos  Thank you for allowing pharmacy to be a part of this patient's care.  Eliseo Gum, PharmD PGY1 Pharmacy Resident   07/27/2022  6:45 PM

## 2022-07-27 NOTE — Consult Note (Signed)
ED Consult    Reason for Consult:  needs right aka Referring Physician:  Dr. Jimmye Norman MRN #:  [id number]  History of Present Illness: This is a 62 y.o. male recently evaluated by me in the office for right lower extremity numbness and pain.  Patient is nonambulatory from previous stroke and does not use the right lower extremity.  1 week ago I offered right above-knee amputation the patient was not amenable at the time.  He is now admitted with COVID.  He has persistent pain in the right lower extremity.  I evaluated with his significant other at bedside today.  Past Medical History:  Diagnosis Date   Alcohol abuse    Cocaine abuse ([place]) 2014   Hypertension    Infection of wound due to methicillin resistant Staphylococcus aureus (MRSA)    Seizures ([place]) 07/2015   had first seizures about 6 months after stroke   Status epilepticus ([place]) 07/27/2021   Stroke ([place]) 2014   denies residual on 07/03/2015   Stroke ([place]) 07/02/2015   "now weak on right side; speech problems" (07/03/2015)   Tobacco use     Past Surgical History:  Procedure Laterality Date   COLONOSCOPY WITH PROPOFOL N/A 03/02/2019   Procedure: COLONOSCOPY WITH PROPOFOL;  Surgeon: Carol Ada, MD;  Location: [facility];  Service: Endoscopy;  Laterality: N/A;   ESOPHAGOGASTRODUODENOSCOPY (EGD) WITH PROPOFOL N/A 03/23/2019   Procedure: ESOPHAGOGASTRODUODENOSCOPY (EGD) WITH PROPOFOL;  Surgeon: Carol Ada, MD;  Location[facility];  Service: Endoscopy;  Laterality: N/A;   POLYPECTOMY  03/02/2019   Procedure: POLYPECTOMY;  Surgeon: Carol Ada, MD;  [facility];  Service: Endoscopy;;   SAVORY DILATION N/A 03/23/2019   Procedure: Azzie Almas DILATION;  Surgeon: Carol Ada, MD;  [facility];  Service: Endoscopy;  Laterality: N/A;   SKIN GRAFT Bilateral 1980s   "got burned by some hot water"    No Known Allergies  Prior to Admission medications   Medication Sig Start Date End Date Taking? Authorizing  Provider  amLODipine-olmesartan (AZOR) 10-40 MG tablet Take 1 tablet by mouth daily. 06/15/22 03/12/23  Nani Gasser, MD  aspirin EC 81 MG tablet Take 1 tablet (81 mg total) by mouth daily. Swallow whole. 06/02/22   Iona Coach, MD  atorvastatin (LIPITOR) 20 MG tablet Take 1 tablet (20 mg total) by mouth daily at 6 PM. Patient taking differently: Take 20 mg by mouth daily. 09/06/16   Charlott Rakes, MD  divalproex (DEPAKOTE ER) 500 MG 24 hr tablet Take 1,500 mg by mouth at bedtime. 07/06/22   [provider]  fluticasone (FLONASE) 50 MCG/ACT nasal spray Place 1 spray into both nostrils daily. 06/15/22 06/15/23  Nani Gasser, MD  lacosamide (VIMPAT) 50 MG TABS tablet Take 1 tablet (50 mg total) by mouth 2 (two) times daily. 06/01/22   Iona Coach, MD  levETIRAcetam (KEPPRA) 750 MG tablet Take 2 tablets (1,500 mg total) by mouth 2 (two) times daily. 09/22/21   Frann Rider, NP  metoprolol tartrate (LOPRESSOR) 25 MG tablet Take 1 tablet (25 mg total) by mouth 2 (two) times daily. 01/28/17   Charlott Rakes, MD    Social History   Socioeconomic History   Marital status: Significant Other    Spouse name: Not on file   Number of children: Not on file   Years of education: Not on file   Highest education level: Not on file  Occupational History   Not on file  Tobacco Use   Smoking status: Some Days  Packs/day: 0.25    Years: 34.00    Total pack years: 8.50    Types: Cigarettes    Last attempt to quit: 07/01/2015    Years since quitting: 7.0   Smokeless tobacco: Never   Tobacco comments:    smokes 6-7 cigarette daily/fim.  Not smoking  for awhile   Vaping Use   Vaping Use: Never used  Substance and Sexual Activity   Alcohol use: Yes    Alcohol/week: 1.0 - 2.0 standard drink of alcohol    Types: 1 - 2 Cans of beer per week    Comment: occassionally -beer   Drug use: No    Types: Cocaine    Comment: 07/03/2015 "hasn't had any for some time"   Sexual activity: Yes   Other Topics Concern   Not on file  Social History Narrative   Not on file   Social Determinants of Health   Financial Resource Strain: Not on file  Food Insecurity: No Food Insecurity (06/15/2022)   Hunger Vital Sign    Worried About Running Out of Food in the Last Year: Never true    Ran Out of Food in the Last Year: Never true  Transportation Needs: No Transportation Needs (06/15/2022)   PRAPARE - Hydrologist (Medical): No    Lack of Transportation (Non-Medical): No  Physical Activity: Not on file  Stress: Not on file  Social Connections: Moderately Integrated (06/15/2022)   Social Connection and Isolation Panel [NHANES]    Frequency of Communication with Friends and Family: More than three times a week    Frequency of Social Gatherings with Friends and Family: More than three times a week    Attends Religious Services: More than 4 times per year    Active Member of [employer] or Organizations: No    Attends Archivist Meetings: Never    Marital Status: Married  Human resources officer Violence: Not At Risk (06/15/2022)   Humiliation, Afraid, Rape, and Kick questionnaire    Fear of Current or Ex-Partner: No    Emotionally Abused: No    Physically Abused: No    Sexually Abused: No    Family History  Problem Relation Age of Onset   Hypertension Mother    Hypertension Father    Stroke Paternal Aunt     Review of Systems  Constitutional:  Positive for fever.  Cardiovascular:  Positive for leg swelling.  Gastrointestinal: Negative.   Musculoskeletal:        Leg pain  Neurological:  Positive for sensory change and focal weakness.  Psychiatric/Behavioral: Negative.         Physical Examination  Vitals:   07/27/22 1500 07/27/22 1800  BP: 124/83 123/82  Pulse: 92 94  Resp: 20 (!) 21  Temp: 97.7 F (36.5 C)   SpO2: 99% 97%   Body mass index is 32.82 kg/m.  Physical Exam HENT:     Head: Normocephalic.  Pulmonary:     Effort:  Pulmonary effort is normal.  Abdominal:     General: Abdomen is flat.  Musculoskeletal:     Comments: The right foot is cool and the right lower extremity is contracted  Neurological:     Mental Status: He is alert.  Psychiatric:        Mood and Affect: Mood normal.        CBC    Component Value Date/Time   WBC 13.6 (H) 07/27/2022 1215   RBC 4.84 07/27/2022 1215   HGB  15.7 07/27/2022 1215   HGB 14.2 10/08/2021 1220   HCT 47.0 07/27/2022 1215   HCT 43.3 10/08/2021 1220   PLT 349 07/27/2022 1215   PLT 215 10/08/2021 1220   MCV 97.1 07/27/2022 1215   MCV 95 10/08/2021 1220   MCH 32.4 07/27/2022 1215   MCHC 33.4 07/27/2022 1215   RDW 14.0 07/27/2022 1215   RDW 12.4 10/08/2021 1220   LYMPHSABS 4.5 (H) 07/27/2022 1215   LYMPHSABS 3.3 (H) 10/08/2021 1220   MONOABS 1.7 (H) 07/27/2022 1215   EOSABS 0.0 07/27/2022 1215   EOSABS 0.1 10/08/2021 1220   BASOSABS 0.0 07/27/2022 1215   BASOSABS 0.0 10/08/2021 1220    BMET    Component Value Date/Time   NA 139 07/27/2022 1215   NA 139 06/15/2022 1014   K 3.3 (L) 07/27/2022 1215   CL 101 07/27/2022 1215   CO2 28 07/27/2022 1215   GLUCOSE 107 (H) 07/27/2022 1215   BUN 25 (H) 07/27/2022 1215   BUN 11 06/15/2022 1014   CREATININE 1.30 (H) 07/27/2022 1215   CREATININE 0.94 04/20/2016 1022   CALCIUM 9.6 07/27/2022 1215   GFRNONAA >60 07/27/2022 1215   GFRNONAA >89 04/20/2016 1022   GFRAA >60 05/22/2016 0937   GFRAA >89 04/20/2016 1022    COAGS: Lab Results  Component Value Date   INR 1.2 07/27/2022   INR 1.7 (H) 05/27/2022   INR 1.20 05/22/2016     Non-Invasive Vascular Imaging:   No new studies  ASSESSMENT/PLAN: This is a 62 y.o. male with non functional right lower extremity now with evidence of worsening foot infection. Plan for Right AKA later this week in the OR.  I discussed with the patient and his significant other as well as the sister via telephone they are not amenable to surgery today which I  offered.  Shonnie Poudrier C. Donzetta Matters, MD [facility] Office: [phone] Pager: [phone]

## 2022-07-27 NOTE — H&P (Addendum)
Date: 07/27/2022               Patient Name:  Gerald [name] MRN: [id number]  DOB: Nov 11, 1960 Age / Sex: 62 y.o., male   PCP: Adron Bene, MD         Medical Service: Internal Medicine Teaching Service         Attending Physician: Dr. Mayford Knife, Dorene Ar, MD      First Contact: Dr. Rocky Morel, DO Pager [phone]    Second Contact: Dr. 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  His wife is in the room to provide supplemental history as he is not fully alert.  Weakness about a week ago he started periodically staring off into space and maybe had a mild left hand tremor but does not think these are consistent with his past seizures.  She states that this presentation is very similar to his last hospitalization where he was found to have COVID and a prerenal AKI.  He is recently had worsening right lower foot ischemia secondary to PAD and was seen by vascular surgery last week who recommended above-the-knee amputation which the family had been discussing but had not made a decision.  His wife notes that his foot today looks worse than he had prior.  She also notes that he has had poor oral intake due to his confusion at home.  She denies any new or worsening focal neurodeficits since discharge from our service in 06/02/2022.  ED Course: Patient was hypertensive on arrival but otherwise had stable vitals and was afebrile.  He was found to have leukocytosis at 13.6, normal lactic  acid, CK of 817, creatinine 1.30, no acute abnormalities on CT of the head without contrast and CT of the abdomen and pelvis with contrast, and UA was not indicative of a UTI.  He was found to be COVID-positive but did not have any return or new viral symptoms from COVID infection in 05/2022.  VBG did not show any acidosis or hypercarbia.  Meds:  Amlodipine-olmesartan 10-40 mg daily Aspirin 81 mg daily Atorvastatin 20 mg daily Depakote 1500 mg nightly Vimpat 50 mg twice daily Keppra 1500 mg twice daily Metoprolol 25 mg twice daily  Past Medical History Past Medical History:  Diagnosis Date   Alcohol abuse    Cocaine abuse ([facility]) 2014   Hypertension    Infection of wound due to methicillin resistant Staphylococcus aureus (MRSA)    Seizures ([facility]) 07/2015   had first seizures about 6 months after stroke   Status epilepticus ([facility]) 07/27/2021   Stroke ([facility]) 2014   denies residual on 07/03/2015   Stroke ([facility]) 07/02/2015   "now weak on right side; speech problems" (07/03/2015)   Tobacco use      Past Surgical History:  Procedure Laterality Date   COLONOSCOPY WITH PROPOFOL N/A 03/02/2019   Procedure: COLONOSCOPY WITH PROPOFOL;  Surgeon: Jeani Hawking, MD;  [facility];  Service: Endoscopy;  Laterality: N/A;   ESOPHAGOGASTRODUODENOSCOPY (EGD) WITH PROPOFOL N/A 03/23/2019  Procedure: ESOPHAGOGASTRODUODENOSCOPY (EGD) WITH PROPOFOL;  Surgeon: Jeani Hawking, MD;  [facility];  Service: Endoscopy;  Laterality: N/A;   POLYPECTOMY  03/02/2019   Procedure: POLYPECTOMY;  Surgeon: Jeani Hawking, MD;  [facility];  Service: Endoscopy;;   SAVORY DILATION N/A 03/23/2019   Procedure: Gaspar Bidding DILATION;  Surgeon: Jeani Hawking, MD;  [facility];  Service: Endoscopy;  Laterality: N/A;   SKIN GRAFT Bilateral 1980s   "got burned by some hot water"    Social:  Lives with wife at home Support: Family in the area Level of Function: Able to feed himself and use the  toilet but otherwise needs assistant for ADLs and IADLs.  Uses a wheelchair to get around. Non ambulatory but can do transfers on L leg. PCP: Adron Bene, MD Substances: Drinks up to a 6 pack when watching football. Does use tobacco. History of cocaine abuse.   Family History:  Family History  Problem Relation Age of Onset   Hypertension Mother    Hypertension Father    Stroke Paternal Aunt      Allergies: Allergies as of 07/27/2022   (No Known Allergies)    Review of Systems: A complete ROS was negative except as per HPI.   OBJECTIVE:   Physical Exam: Blood pressure 123/82, pulse 94, temperature 97.7 F (36.5 C), temperature source Oral, resp. rate (!) 21, height 6' (1.829 m), SpO2 97 %.  Constitutional: Chronically ill-appearing male who appears older than stated age. In no acute distress. HENT: Normocephalic, atraumatic,  Eyes: Sclera non-icteric, PERRL, EOM intact Cardio:Regular rate and rhythm.  2+ bilateral radial pulses.  Absent bilateral dorsalis pedis pulses. Pulm:Clear to auscultation bilaterally. Normal work of breathing on room air. Abdomen: Soft, non-tender, non-distended, positive bowel sounds. MSK: Right lower extremity below the knee is cool to the touch until the foot which is slightly warmer as well as erythematous and edematous.  Toes on the right foot appear partially gangrenous with evidence of chronic poor blood flow.  Left lower extremity is warm but there is evidence of decreased blood flow including poor nail health but no active ulcers noted. Neuro:Alert and oriented to person and place.  Chronic right-sided weakness present on exam with very limited active movement of his right upper extremity and no movement of his right lower extremity.  Cranial nerves II through XII grossly intact.  Repetition of words/phrases throughout exam including yes, a little bit, thank you there were not appropriate for the question majority the time.  Labs: CBC     Component Value Date/Time   WBC 13.6 (H) 07/27/2022 1215   RBC 4.84 07/27/2022 1215   HGB 15.7 07/27/2022 1215   HGB 14.2 10/08/2021 1220   HCT 47.0 07/27/2022 1215   HCT 43.3 10/08/2021 1220   PLT 349 07/27/2022 1215   PLT 215 10/08/2021 1220   MCV 97.1 07/27/2022 1215   MCV 95 10/08/2021 1220   MCH 32.4 07/27/2022 1215   MCHC 33.4 07/27/2022 1215   RDW 14.0 07/27/2022 1215   RDW 12.4 10/08/2021 1220   LYMPHSABS 4.5 (H) 07/27/2022 1215   LYMPHSABS 3.3 (H) 10/08/2021 1220   MONOABS 1.7 (H) 07/27/2022 1215   EOSABS 0.0 07/27/2022 1215   EOSABS 0.1 10/08/2021 1220   BASOSABS 0.0 07/27/2022 1215   BASOSABS 0.0 10/08/2021 1220     CMP     Component Value Date/Time   NA 139 07/27/2022 1215   NA 139 06/15/2022 1014   K 3.3 (L) 07/27/2022 1215  CL 101 07/27/2022 1215   CO2 28 07/27/2022 1215   GLUCOSE 107 (H) 07/27/2022 1215   BUN 25 (H) 07/27/2022 1215   BUN 11 06/15/2022 1014   CREATININE 1.30 (H) 07/27/2022 1215   CREATININE 0.94 04/20/2016 1022   CALCIUM 9.6 07/27/2022 1215   PROT 8.6 (H) 07/27/2022 1215   PROT 6.7 10/08/2021 1220   ALBUMIN 2.8 (L) 07/27/2022 1215   ALBUMIN 3.4 (L) 10/08/2021 1220   AST 35 07/27/2022 1215   ALT 18 07/27/2022 1215   ALKPHOS 63 07/27/2022 1215   BILITOT 0.8 07/27/2022 1215   BILITOT 0.4 10/08/2021 1220   GFRNONAA >60 07/27/2022 1215   GFRNONAA >89 04/20/2016 1022   GFRAA >60 05/22/2016 0937   GFRAA >89 04/20/2016 1022    Imaging: CT ABDOMEN PELVIS W CONTRAST  Result Date: 07/27/2022 IMPRESSION: 1. No acute abdominal/pelvic findings or adenopathy. 2. Indeterminate 3 cm exophytic interpolar right renal lesion. This could be a hemorrhagic cyst or an enhancing solid neoplasm. Ultrasound may be helpful for further evaluation and to discriminate between cyst and solid lesion. 3. Age advanced vascular disease. Aortic Atherosclerosis (ICD10-I70.0). Electronically Signed   By: Rudie Meyer M.D.   On: 07/27/2022 14:46   CT Head Wo  Contrast  Result Date: 07/27/2022 IMPRESSION: No acute intracranial abnormality. Old left ACA distribution infarct. Small vessel ischemic changes. Electronically Signed   By: Charline Bills M.D.   On: 07/27/2022 14:37   DG Chest Portable 1 View  Result Date: 07/27/2022 IMPRESSION: No evidence of acute cardiopulmonary disease. Electronically Signed   By: Charline Bills M.D.   On: 07/27/2022 12:47     EKG: personally reviewed my interpretation is sinus rhythm with background noise, overall consistent with prior EKG.   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  Right lower extremity critical ischemia with gangrene Right lower extremity cellulitis Acute encephalopathy/delirium Patient presents with progressive altered mental status consistent with a prior event secondary to infection.  His mental status seems to wax and wane between alert and oriented to person and place and slightly to situation to repeating words over and over again and not comprehending questions.  Hemodynamically stable without current SIRS response but has obvious signs of infection in his right foot and ankle.  I discussed the case with Dr. Randie Heinz who saw the patient last week  and recommended above-the-knee amputation.  He we will schedule the patient for surgery tomorrow (update, family would like to hold off for surgery later this week).  - Blood cultures - Vanco and cefepime - N.p.o. at midnight for planned surgery tomorrow - Monitor fever curve and repeat CBC in the morning - Tylenol 1 g 3 times daily - Oxycodone 5 mg every 6 hours as needed  Pre-renal AKI Patient presented with creatinine of 1.30 up from baseline of below 1.  This is in the setting of decreased oral intake at home for the past week.  We will give some fluids and repeat BMP in the morning. - LR 125 mL an hour - Repeat BMP in the morning  Hypokalemia Potassium 3.3 on admission likely due to decreased oral intake over the past week.  We will supplement and repeat a BMP in the morning.  Peripheral arterial disease Patient has critical limb ischemia of his right lower extremity as above but also has diminished/absent pulses in his left lower extremity.  He has seen vascular surgery outpatient who as above recommended above-the-knee amputation for his right lower extremity.  We will continue to work with vascular to see if he needs any further studies of his left lower extremity or remaining vasculature after addressing his acute presentation. - Hold aspirin until after surgery  Prior left ACA CVA with residual right-sided spastic hemiplegia Seizure disorder Patient does present altered as above but does not appear to have any new focal neurologic deficits and had no evidence of intracranial hemorrhage on CT scan.  He also has been taking his antiepileptics consistently at home and has not had a seizure in at least a year.  Depakote level is therapeutic.  - Continue home Depakote 1500 mg daily, Vimpat 50 mg twice daily, and Keppra 1500 mg twice daily  Diet: NPO at midnight VTE: Heparin IVF: LR,125cc/hr Code: Full   Dispo: Admit patient to Inpatient with expected length of stay greater than 2  midnights.  Signed: Rocky Morel, DO Internal Medicine Resident PGY-1  07/27/2022, 8:34 PM   Dr. Rocky Morel, DO Pager [phone]

## 2022-07-28 DIAGNOSIS — I70261 Atherosclerosis of native arteries of extremities with gangrene, right leg: Secondary | ICD-10-CM | POA: Diagnosis not present

## 2022-07-28 DIAGNOSIS — F17210 Nicotine dependence, cigarettes, uncomplicated: Secondary | ICD-10-CM | POA: Diagnosis not present

## 2022-07-28 DIAGNOSIS — U071 COVID-19: Secondary | ICD-10-CM

## 2022-07-28 DIAGNOSIS — G9340 Encephalopathy, unspecified: Secondary | ICD-10-CM | POA: Diagnosis not present

## 2022-07-28 DIAGNOSIS — L03115 Cellulitis of right lower limb: Secondary | ICD-10-CM

## 2022-07-28 DIAGNOSIS — R410 Disorientation, unspecified: Secondary | ICD-10-CM | POA: Diagnosis present

## 2022-07-28 LAB — CBC
HCT: 38.4 % — ABNORMAL LOW (ref 39.0–52.0)
Hemoglobin: 12.7 g/dL — ABNORMAL LOW (ref 13.0–17.0)
MCH: 32.3 pg (ref 26.0–34.0)
MCHC: 33.1 g/dL (ref 30.0–36.0)
MCV: 97.7 fL (ref 80.0–100.0)
Platelets: 262 10*3/uL (ref 150–400)
RBC: 3.93 MIL/uL — ABNORMAL LOW (ref 4.22–5.81)
RDW: 14.1 % (ref 11.5–15.5)
WBC: 10.4 10*3/uL (ref 4.0–10.5)
nRBC: 0 % (ref 0.0–0.2)

## 2022-07-28 LAB — LEVETIRACETAM LEVEL: Levetiracetam Lvl: 117.2 ug/mL — ABNORMAL HIGH (ref 10.0–40.0)

## 2022-07-28 LAB — BASIC METABOLIC PANEL
Anion gap: 13 (ref 5–15)
BUN: 21 mg/dL — ABNORMAL HIGH (ref 6–20)
CO2: 23 mmol/L (ref 22–32)
Calcium: 9 mg/dL (ref 8.9–10.3)
Chloride: 105 mmol/L (ref 98–111)
Creatinine, Ser: 0.87 mg/dL (ref 0.61–1.24)
GFR, Estimated: >60 mL/min (ref >60)
Glucose, Bld: 102 mg/dL — ABNORMAL HIGH (ref 70–99)
Potassium: 4 mmol/L (ref 3.5–5.1)
Sodium: 141 mmol/L (ref 135–145)

## 2022-07-28 LAB — MAGNESIUM: Magnesium: 1.8 mg/dL (ref 1.7–2.4)

## 2022-07-28 MED ORDER — POTASSIUM CHLORIDE 10 MEQ/100ML IV SOLN
10.0000 meq | INTRAVENOUS | Status: AC
  Administered 2022-07-28 (×2): 10 meq via INTRAVENOUS
  Filled 2022-07-28 (×2): qty 100

## 2022-07-28 MED ORDER — ENOXAPARIN SODIUM 60 MG/0.6ML IJ SOSY
60.0000 mg | PREFILLED_SYRINGE | INTRAMUSCULAR | Status: DC
  Administered 2022-07-28: 60 mg via SUBCUTANEOUS
  Filled 2022-07-28: qty 0.6

## 2022-07-28 MED ORDER — VANCOMYCIN HCL 1250 MG/250ML IV SOLN
1250.0000 mg | Freq: Two times a day (BID) | INTRAVENOUS | Status: DC
  Administered 2022-07-28 – 2022-07-30 (×6): 1250 mg via INTRAVENOUS
  Filled 2022-07-28 (×8): qty 250

## 2022-07-28 MED ORDER — POTASSIUM CHLORIDE 10 MEQ/100ML IV SOLN
10.0000 meq | Freq: Once | INTRAVENOUS | Status: AC
  Administered 2022-07-28: 10 meq via INTRAVENOUS
  Filled 2022-07-28: qty 100

## 2022-07-28 NOTE — Progress Notes (Cosign Needed)
HD#1 Subjective:   Summary: Gerald [name] is a 62 y.o. 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   Overnight Events: None  Patient was doing much better this morning and feels near to himself.  His sister is at bedside and states that he is also near his baseline.  He understands he will need an amputation to help his body heal and is understandably sad about this.  He denies any other new or worsening issues.  Objective:  Vital signs in last 24 hours: Vitals:   07/28/22 0236 07/28/22 0523 07/28/22 0804 07/28/22 1104  BP: (!) 159/97  (!) 143/89   Pulse: (!) 104  (!) 108   Resp: 20  20 20  $ Temp: 98 F (36.7 C)  98.4 F (36.9 C) 97.6 F (36.4 C)  TempSrc: Oral  Oral Oral  SpO2: 96% 95% 96%   Height:       Supplemental O2: Room Air SpO2: 96 %   Physical Exam:  Constitutional: Chronically ill-appearing male appears older than stated age, in no acute distress Pulmonary/Chest: normal work of breathing on room air MSK: Right lower extremity below the knee is cool to the touch until the foot which is slightly warmer as well as erythematous and edematous.  Toes on the right foot appear partially gangrenous with evidence of chronic poor blood flow proximal to the gangrene.  Left lower extremity is warm but there is evidence of decreased blood flow including poor nail health but no active ulcers noted. Neuro:Alert and oriented to person and place.  Chronic right-sided weakness present on exam with very limited active movement of his right upper extremity and no movement of his right lower extremity.    BP (!) 143/89 (BP Location: Right Arm)   Pulse (!) 108    Temp 97.6 F (36.4 C) (Oral)   Resp 20   Ht 6' (1.829 m)   SpO2 96%   BMI 32.82 kg/m     Intake/Output Summary (Last 24 hours) at 07/28/2022 1628 Last data filed at 07/28/2022 0615 Gross per 24 hour  Intake 648.51 ml  Output --  Net 648.51 ml   Net IO Since Admission: 1,298.51 mL [07/28/22 1628]  Pertinent Labs:    Latest Ref Rng & Units 07/28/2022    4:42 AM 07/27/2022    8:51 PM 07/27/2022   12:15 PM  CBC  WBC 4.0 - 10.5 K/uL 10.4  11.4  13.6   Hemoglobin 13.0 - 17.0 g/dL 12.7  14.1  15.7   Hematocrit 39.0 - 52.0 % 38.4  43.0  47.0   Platelets 150 - 400 K/uL 262  324  349        Latest Ref Rng & Units 07/28/2022    4:42 AM 07/27/2022    8:51 PM 07/27/2022   12:15 PM  CMP  Glucose 70 - 99 mg/dL 102   107   BUN 6 - 20 mg/dL 21   25   Creatinine 0.61 - 1.24 mg/dL 0.87  0.97  1.30   Sodium 135 - 145 mmol/L 141   139   Potassium 3.5 - 5.1 mmol/L 4.0   3.3   Chloride 98 - 111 mmol/L 105   101  CO2 22 - 32 mmol/L 23   28   Calcium 8.9 - 10.3 mg/dL 9.0   9.6   Total Protein 6.5 - 8.1 g/dL   8.6   Total Bilirubin 0.3 - 1.2 mg/dL   0.8   Alkaline Phos 38 - 126 U/L   63   AST 15 - 41 U/L   35   ALT 0 - 44 U/L   18     Assessment/Plan:   Principal Problem:   Critical limb ischemia of right lower extremity with gangrene (HCC) Active Problems:   Essential hypertension   Spastic hemiplegia affecting dominant side (HCC)   Seizure disorder (HCC)   History of stroke   PAD (peripheral artery disease) (HCC)   Cellulitis of foot   AKI (acute kidney injury) ([place])   Patient Summary: Gerald [name] is a 62 y.o. 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  Right lower extremity critical ischemia with gangrene Right lower extremity cellulitis Delirium 2/2 infection, prior CVA, and hx of seizures Mental status is much improved today and his sister is at bedside.  She states he is nearly back to his baseline at least in relation to when he was discharged from the hospital in December.  Leukocytosis resolved and patient has remained afebrile and hemodynamically stable.  Dr. Donzetta Matters with vascular surgery evaluated the patient this morning and family decided to hold off on amputation until Friday.  Patient and family are still on board with this plan.  We appreciate Dr. Donzetta Matters and we will plan to hold anticoagulation the night prior to surgery. - Monitor blood cultures - Continue Vanco and cefepime - Monitor fever curve and repeat CBC in the morning - Discontinue telemetry - Tylenol 1 g 3 times daily - Oxycodone 5 mg every 6 hours as needed   Pre-renal AKI, resolved Patient presented with creatinine of 1.30 up from baseline of below 1. Responded well to IV fluids and creatinine down to 0.87. - D/c LR 125 mL an hour - Repeat BMP in the morning   Hypokalemia, resolved Potassium 3.3 on admission and up to 4.0 after supplementation.    Peripheral arterial disease Patient has critical limb ischemia of his right lower extremity as above but also has diminished/absent pulses in his left lower extremity.  We will continue to work with vascular to see if he needs any further studies of his left lower extremity or remaining vasculature after addressing his acute presentation. - Hold aspirin until after surgery   Prior left ACA CVA with residual right-sided spastic hemiplegia Seizure disorder Patient did present altered as above but does not appear to have any new focal neurologic deficits and had no evidence of intracranial hemorrhage on CT scan.  He also has been taking his antiepileptics consistently at home and has not had a seizure in at  least a year. - Continue home Depakote 1500 mg daily, Vimpat 50 mg twice daily, and Keppra 1500 mg twice daily  Diet: Heart Healthy IVF: None, VTE: Enoxaparin Code: Full Family Update: Family updated in room.   Dispo: Pending PT/OT evaluation following planned right AKA on 2/16.   Johny Blamer, DO Internal Medicine Resident PGY-1 Pager: [phone]  Please contact the on call pager after 5 pm and on weekends at [phone].
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
## 2022-07-28 NOTE — Progress Notes (Signed)
Pharmacy Antibiotic Note  Gerald [name] is a 62 y.o. male admitted on 07/27/2022 with with non functional right lower extremity now with evidence of worsening foot infection. Plan for Right AKA 2/14. Pharmacy has been consulted for vancomycin and cefepime dosing.    WBC down to normal.  2/14 Vancomycin 1220m Q 12 hr Scr used: 0.87 mg/dL Weight: 109.8 kg Vd coeff: 0.5 L/kg Est AUC: 519  Plan: Vancomycin 1250 q12 hr Cefepime 2g IV q8h  F/u C&S, renal function, LOT and de-escalate as able    Height: 6' (182.9 cm) IBW/kg (Calculated) : 77.6  Temp (24hrs), Avg:98 F (36.7 C), Min:97.7 F (36.5 C), Max:98.7 F (37.1 C)  Recent Labs  Lab 07/27/22 1215 07/27/22 2051 07/28/22 0442  WBC 13.6* 11.4* 10.4  CREATININE 1.30* 0.97 0.87  LATICACIDVEN 1.3  --   --      Estimated Creatinine Clearance: 117 mL/min (by C-G formula based on SCr of 0.87 mg/dL).    No Known Allergies  Antimicrobials this admission: 2/13 cefepime >>  2/13 vancomycin >>   Microbiology results: 2/13 BCx: sent 2/13 RVP: covid-19 pos  Thank you for allowing pharmacy to be a part of this patient's care.  LBenetta Spar PharmD, BCPS, BCCP Clinical Pharmacist  Please check AMION for all [place] numbers After 10:00 PM, call [phone]

## 2022-07-29 DIAGNOSIS — G9340 Encephalopathy, unspecified: Secondary | ICD-10-CM | POA: Diagnosis not present

## 2022-07-29 DIAGNOSIS — I70261 Atherosclerosis of native arteries of extremities with gangrene, right leg: Secondary | ICD-10-CM | POA: Diagnosis not present

## 2022-07-29 DIAGNOSIS — L03115 Cellulitis of right lower limb: Secondary | ICD-10-CM | POA: Diagnosis not present

## 2022-07-29 DIAGNOSIS — D649 Anemia, unspecified: Secondary | ICD-10-CM | POA: Insufficient documentation

## 2022-07-29 DIAGNOSIS — F17210 Nicotine dependence, cigarettes, uncomplicated: Secondary | ICD-10-CM | POA: Diagnosis not present

## 2022-07-29 DIAGNOSIS — R410 Disorientation, unspecified: Secondary | ICD-10-CM

## 2022-07-29 LAB — BASIC METABOLIC PANEL
Anion gap: 10 (ref 5–15)
BUN: 12 mg/dL (ref 6–20)
CO2: 22 mmol/L (ref 22–32)
Calcium: 8.6 mg/dL — ABNORMAL LOW (ref 8.9–10.3)
Chloride: 107 mmol/L (ref 98–111)
Creatinine, Ser: 0.71 mg/dL (ref 0.61–1.24)
GFR, Estimated: >60 mL/min (ref >60)
Glucose, Bld: 92 mg/dL (ref 70–99)
Potassium: 3.5 mmol/L (ref 3.5–5.1)
Sodium: 139 mmol/L (ref 135–145)

## 2022-07-29 LAB — CBC WITH DIFFERENTIAL/PLATELET
Abs Immature Granulocytes: 0.06 10*3/uL (ref 0.00–0.07)
Basophils Absolute: 0 10*3/uL (ref 0.0–0.1)
Basophils Relative: 0 %
Eosinophils Absolute: 0 10*3/uL (ref 0.0–0.5)
Eosinophils Relative: 0 %
HCT: 34.9 % — ABNORMAL LOW (ref 39.0–52.0)
Hemoglobin: 11.6 g/dL — ABNORMAL LOW (ref 13.0–17.0)
Immature Granulocytes: 1 %
Lymphocytes Relative: 30 %
Lymphs Abs: 2.5 10*3/uL (ref 0.7–4.0)
MCH: 32 pg (ref 26.0–34.0)
MCHC: 33.2 g/dL (ref 30.0–36.0)
MCV: 96.1 fL (ref 80.0–100.0)
Monocytes Absolute: 1.4 10*3/uL — ABNORMAL HIGH (ref 0.1–1.0)
Monocytes Relative: 16 %
Neutro Abs: 4.4 10*3/uL (ref 1.7–7.7)
Neutrophils Relative %: 53 %
Platelets: 256 10*3/uL (ref 150–400)
RBC: 3.63 MIL/uL — ABNORMAL LOW (ref 4.22–5.81)
RDW: 14 % (ref 11.5–15.5)
WBC: 8.3 10*3/uL (ref 4.0–10.5)
nRBC: 0 % (ref 0.0–0.2)

## 2022-07-29 LAB — CBC
HCT: 35.1 % — ABNORMAL LOW (ref 39.0–52.0)
Hemoglobin: 11.3 g/dL — ABNORMAL LOW (ref 13.0–17.0)
MCH: 31.8 pg (ref 26.0–34.0)
MCHC: 32.2 g/dL (ref 30.0–36.0)
MCV: 98.9 fL (ref 80.0–100.0)
Platelets: 252 10*3/uL (ref 150–400)
RBC: 3.55 MIL/uL — ABNORMAL LOW (ref 4.22–5.81)
RDW: 14.1 % (ref 11.5–15.5)
WBC: 9.2 10*3/uL (ref 4.0–10.5)
nRBC: 0 % (ref 0.0–0.2)

## 2022-07-29 LAB — SURGICAL PCR SCREEN
MRSA, PCR: NEGATIVE
Staphylococcus aureus: NEGATIVE

## 2022-07-29 LAB — IRON AND TIBC
Iron: 82 ug/dL (ref 45–182)
Saturation Ratios: 36 % (ref 17.9–39.5)
TIBC: 228 ug/dL — ABNORMAL LOW (ref 250–450)
UIBC: 146 ug/dL

## 2022-07-29 LAB — FERRITIN: Ferritin: 773 ng/mL — ABNORMAL HIGH (ref 24–336)

## 2022-07-29 MED ORDER — ENOXAPARIN SODIUM 60 MG/0.6ML IJ SOSY
60.0000 mg | PREFILLED_SYRINGE | INTRAMUSCULAR | Status: AC
  Administered 2022-07-29: 60 mg via SUBCUTANEOUS
  Filled 2022-07-29: qty 0.6

## 2022-07-29 MED ORDER — POTASSIUM CHLORIDE CRYS ER 20 MEQ PO TBCR
40.0000 meq | EXTENDED_RELEASE_TABLET | Freq: Two times a day (BID) | ORAL | Status: AC
  Administered 2022-07-29 (×2): 40 meq via ORAL
  Filled 2022-07-29 (×2): qty 2

## 2022-07-29 NOTE — Progress Notes (Addendum)
  Progress Note    07/29/2022 7:59 AM * No surgery date entered *  Subjective:  no complaints   Vitals:   07/28/22 2354 07/29/22 0420  BP: (!) 133/95 133/86  Pulse: 76 70  Resp: 20 20  Temp: 98 F (36.7 C) 97.7 F (36.5 C)  SpO2: 96% 95%   Physical Exam: Cardiac:  regular Lungs:  non labored Extremities:  RLE contracted, foot cool with ischemic changes of all toes and blistering of dorsum of foot Neurologic: alert and oriented  CBC    Component Value Date/Time   WBC 8.3 07/29/2022 0719   RBC 3.63 (L) 07/29/2022 0719   HGB 11.6 (L) 07/29/2022 0719   HGB 14.2 10/08/2021 1220   HCT 34.9 (L) 07/29/2022 0719   HCT 43.3 10/08/2021 1220   PLT 256 07/29/2022 0719   PLT 215 10/08/2021 1220   MCV 96.1 07/29/2022 0719   MCV 95 10/08/2021 1220   MCH 32.0 07/29/2022 0719   MCHC 33.2 07/29/2022 0719   RDW 14.0 07/29/2022 0719   RDW 12.4 10/08/2021 1220   LYMPHSABS 2.5 07/29/2022 0719   LYMPHSABS 3.3 (H) 10/08/2021 1220   MONOABS 1.4 (H) 07/29/2022 0719   EOSABS 0.0 07/29/2022 0719   EOSABS 0.1 10/08/2021 1220   BASOSABS 0.0 07/29/2022 0719   BASOSABS 0.0 10/08/2021 1220    BMET    Component Value Date/Time   NA 139 07/29/2022 0152   NA 139 06/15/2022 1014   K 3.5 07/29/2022 0152   CL 107 07/29/2022 0152   CO2 22 07/29/2022 0152   GLUCOSE 92 07/29/2022 0152   BUN 12 07/29/2022 0152   BUN 11 06/15/2022 1014   CREATININE 0.71 07/29/2022 0152   CREATININE 0.94 04/20/2016 1022   CALCIUM 8.6 (L) 07/29/2022 0152   GFRNONAA >60 07/29/2022 0152   GFRNONAA >89 04/20/2016 1022   GFRAA >60 05/22/2016 0937   GFRAA >89 04/20/2016 1022    INR    Component Value Date/Time   INR 1.2 07/27/2022 1215     Intake/Output Summary (Last 24 hours) at 07/29/2022 0759 Last data filed at 07/29/2022 0514 Gross per 24 hour  Intake 1084.92 ml  Output 1050 ml  Net 34.92 ml     Assessment/Plan:  62 y.o. male with critical limb ischemia of right lower extremity in non functional  RLE from prior CVA - Talked with patient and his sister and they did not have any questions this morning and are agreeable to proceed tomorrow - Plan is for right AKA tomorrow 07/30/22 with Dr. Donzetta Matters - NPO after midnight - Consent order placed in chart    Karoline Caldwell, PA-C [facility] [phone] 07/29/2022 7:59 AM  I have independently interviewed and examined patient and agree with PA assessment and plan above.   Daiel Strohecker C. Donzetta Matters, MD [facility] [place] Office: [phone] Pager: [phone]

## 2022-07-29 NOTE — H&P (View-Only) (Signed)
  Progress Note    07/29/2022 7:59 AM * No surgery date entered *  Subjective:  no complaints   Vitals:   07/28/22 2354 07/29/22 0420  BP: (!) 133/95 133/86  Pulse: 76 70  Resp: 20 20  Temp: 98 F (36.7 C) 97.7 F (36.5 C)  SpO2: 96% 95%   Physical Exam: Cardiac:  regular Lungs:  non labored Extremities:  RLE contracted, foot cool with ischemic changes of all toes and blistering of dorsum of foot Neurologic: alert and oriented  CBC    Component Value Date/Time   WBC 8.3 07/29/2022 0719   RBC 3.63 (L) 07/29/2022 0719   HGB 11.6 (L) 07/29/2022 0719   HGB 14.2 10/08/2021 1220   HCT 34.9 (L) 07/29/2022 0719   HCT 43.3 10/08/2021 1220   PLT 256 07/29/2022 0719   PLT 215 10/08/2021 1220   MCV 96.1 07/29/2022 0719   MCV 95 10/08/2021 1220   MCH 32.0 07/29/2022 0719   MCHC 33.2 07/29/2022 0719   RDW 14.0 07/29/2022 0719   RDW 12.4 10/08/2021 1220   LYMPHSABS 2.5 07/29/2022 0719   LYMPHSABS 3.3 (H) 10/08/2021 1220   MONOABS 1.4 (H) 07/29/2022 0719   EOSABS 0.0 07/29/2022 0719   EOSABS 0.1 10/08/2021 1220   BASOSABS 0.0 07/29/2022 0719   BASOSABS 0.0 10/08/2021 1220    BMET    Component Value Date/Time   NA 139 07/29/2022 0152   NA 139 06/15/2022 1014   K 3.5 07/29/2022 0152   CL 107 07/29/2022 0152   CO2 22 07/29/2022 0152   GLUCOSE 92 07/29/2022 0152   BUN 12 07/29/2022 0152   BUN 11 06/15/2022 1014   CREATININE 0.71 07/29/2022 0152   CREATININE 0.94 04/20/2016 1022   CALCIUM 8.6 (L) 07/29/2022 0152   GFRNONAA >60 07/29/2022 0152   GFRNONAA >89 04/20/2016 1022   GFRAA >60 05/22/2016 0937   GFRAA >89 04/20/2016 1022    INR    Component Value Date/Time   INR 1.2 07/27/2022 1215     Intake/Output Summary (Last 24 hours) at 07/29/2022 0759 Last data filed at 07/29/2022 0514 Gross per 24 hour  Intake 1084.92 ml  Output 1050 ml  Net 34.92 ml     Assessment/Plan:  62 y.o. male with critical limb ischemia of right lower extremity in non functional  RLE from prior CVA - Talked with patient and his sister and they did not have any questions this morning and are agreeable to proceed tomorrow - Plan is for right AKA tomorrow 07/30/22 with Dr. Donzetta Matters - NPO after midnight - Consent order placed in chart    Karoline Caldwell, PA-C [facility] [phone] 07/29/2022 7:59 AM  I have independently interviewed and examined patient and agree with PA assessment and plan above.   Koren Sermersheim C. Donzetta Matters, MD [facility] [place] Office: [phone] Pager: [phone]

## 2022-07-29 NOTE — Progress Notes (Addendum)
HD#2 Subjective:   Summary: Gerald [name] is a 62 y.o. 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  Overnight Events: None  Patient continues to do well this morning and is actually looking forward to recovery after surgery.  His wife is in the room with him.  He denies any new or worsening issues.  Objective:  Vital signs in last 24 hours: Vitals:   07/28/22 1720 07/28/22 2013 07/28/22 2354 07/29/22 0420  BP: (!) 143/86 (!) 156/99 (!) 133/95 133/86  Pulse: 98 85 76 70  Resp: (!) 22 20 20 20  $ Temp: 97.9 F (36.6 C) 98.1 F (36.7 C) 98 F (36.7 C) 97.7 F (36.5 C)  TempSrc: Oral Oral Oral Oral  SpO2: 97% 96% 96% 95%  Height:       Supplemental O2: Room Air SpO2: 95 %   Physical Exam:  Constitutional: Chronically ill-appearing male appears older than stated age, in no acute distress Pulmonary/Chest: normal work of breathing on room air MSK: Right lower extremity below the knee is cool to the touch until the foot which is slightly warmer as well as erythematous and edematous.  Toes on the right foot appear partially gangrenous with evidence of chronic poor blood flow proximal to the gangrene.  Ischemic blisters on dorsum. Lateral heel with large hard dry black eschar from unstageable heel pressure wound, noted to be stage 1 at prior hospitalization.  Left lower extremity is warm but there is evidence of decreased blood flow including poor nail health but no active ulcers noted. Neuro:Alert and oriented to person and place.  Chronic right-sided weakness present on exam with very limited active movement of his right upper extremity and no  movement of his right lower extremity.    BP 133/86 (BP Location: Right Arm)   Pulse 70   Temp 97.7 F (36.5 C) (Oral)   Resp 20   Ht 6' (1.829 m)   SpO2 95%   BMI 32.82 kg/m     Intake/Output Summary (Last 24 hours) at 07/29/2022 0601 Last data filed at 07/29/2022 0514 Gross per 24 hour  Intake 1733.43 ml  Output 1050 ml  Net 683.43 ml   Net IO Since Admission: 1,333.43 mL [07/29/22 0601]  Pertinent Labs:    Latest Ref Rng & Units 07/29/2022    1:52 AM 07/28/2022    4:42 AM 07/27/2022    8:51 PM  CBC  WBC 4.0 - 10.5 K/uL 9.2  10.4  11.4   Hemoglobin 13.0 - 17.0 g/dL 11.3  12.7  14.1   Hematocrit 39.0 - 52.0 % 35.1  38.4  43.0   Platelets 150 - 400 K/uL 252  262  324        Latest Ref Rng & Units 07/29/2022    1:52 AM 07/28/2022    4:42 AM 07/27/2022    8:51 PM  CMP  Glucose 70 - 99 mg/dL 92  102    BUN 6 - 20 mg/dL 12  21    Creatinine 0.61 - 1.24 mg/dL 0.71  0.87  0.97   Sodium 135 - 145 mmol/L 139  141    Potassium 3.5 - 5.1 mmol/L 3.5  4.0  Chloride 98 - 111 mmol/L 107  105    CO2 22 - 32 mmol/L 22  23    Calcium 8.9 - 10.3 mg/dL 8.6  9.0      Assessment/Plan:   Principal Problem:   Critical limb ischemia of right lower extremity with gangrene (HCC) Active Problems:   Essential hypertension   Spastic hemiplegia affecting dominant side (HCC)   Seizure disorder (HCC)   History of stroke   PAD (peripheral artery disease) (HCC)   Cellulitis of foot   Acute delirium   Normocytic anemia   Patient Summary: Gerald [name] is a 62 y.o. 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  Right lower extremity critical  ischemia with gangrene Right lower extremity cellulitis Delirium 2/2 infection, (increased risk due to prior CVA, and hx of seizures) Patient is stable and back at baseline mental status.  Blood cultures negative at 2 days.  Dr. Donzetta Matters with vascular surgery evaluated the patient this morning and family decided to hold off on amputation until Friday.  Hold Lovenox for tomorrow, n.p.o. midnight. - Monitor blood cultures - Continue Vanco and cefepime - Monitor fever curve and repeat CBC in the morning - Tylenol 1 g 3 times daily - Oxycodone 5 mg every 6 hours as needed   Pre-renal AKI, resolved Patient presented with creatinine of 1.30 up from baseline of below 1. Responded well to IV fluids and creatinine down to 0.71. - Repeat BMP in the morning   Hypokalemia, resolved Potassium 3.3 on admission and up to 4.0 after supplementation.  3.5 this morning.   Peripheral arterial disease Patient has critical limb ischemia of his right lower extremity as above but also has diminished/absent pulses in his left lower extremity.  We will continue to work with vascular to see if he needs any further studies of his left lower extremity or remaining vasculature after addressing his acute presentation. - Hold aspirin until after surgery   Prior left ACA CVA with residual right-sided spastic hemiplegia Seizure disorder Patient did present altered as above but does not appear to have any new focal neurologic deficits and had no evidence of intracranial hemorrhage on CT scan.  He also has been taking his antiepileptics consistently at home and has not had a seizure in at least a year. - Continue home Depakote 1500 mg daily, Vimpat 50 mg twice daily, and Keppra 1500 mg twice daily  Normocytic anemia Patient has developed anemia after presentation with hemoglobin of 14 down to 11.6.  He has gotten a good amount of fluids and is in an acute state of inflammation.  Iron studies did not show any iron deficiency.   Last colonoscopy in 2020 showed 1 small tubular adenoma.  We will continue to monitor this.  Diet: Heart Healthy IVF: None, VTE: Enoxaparin Code: Full Family Update: Family updated in room.   Dispo: Pending PT/OT evaluation following planned right AKA on 2/16.   Johny Blamer, DO Internal Medicine Resident PGY-1 Pager: [phone]  Please contact the on call pager after 5 pm and on weekends at [phone].

## 2022-07-29 NOTE — Progress Notes (Signed)
  Progress Note   Date: 07/29/2022  Patient Name: Gerald [name]        MRN#: [id number]   Clarification of diagnosis:   a local infection only of R foot;  sepsis ruled out.  Patient has delirium due to critical limb ischemia with R foot cellulitis.

## 2022-07-29 NOTE — Progress Notes (Signed)
   Progress Note   Date: 07/29/2022  Patient Name: Gerald [name]        MRN#: [id number]  Covid -24 is no longer an active infection despite testing positive

## 2022-07-29 NOTE — Progress Notes (Signed)
  Progress Note   Date: 07/29/2022  Patient Name: Gerald [name]        MRN#: [id number]   Clarification of diagnosis:    Delirium due to infection (or infectious - not septic - encephalopathy)

## 2022-07-30 ENCOUNTER — Other Ambulatory Visit: Payer: Self-pay

## 2022-07-30 ENCOUNTER — Encounter (HOSPITAL_COMMUNITY): Payer: Self-pay | Admitting: Internal Medicine

## 2022-07-30 ENCOUNTER — Encounter (HOSPITAL_COMMUNITY)
Admission: EM | Disposition: A | Discharge status: Skilled Nursing Facility | Payer: Self-pay | Source: Home / Self Care | Attending: Internal Medicine

## 2022-07-30 ENCOUNTER — Inpatient Hospital Stay (HOSPITAL_COMMUNITY): Payer: PPO | Admitting: Certified Registered"

## 2022-07-30 DIAGNOSIS — I10 Essential (primary) hypertension: Secondary | ICD-10-CM | POA: Diagnosis not present

## 2022-07-30 DIAGNOSIS — F17210 Nicotine dependence, cigarettes, uncomplicated: Secondary | ICD-10-CM

## 2022-07-30 DIAGNOSIS — D649 Anemia, unspecified: Secondary | ICD-10-CM

## 2022-07-30 DIAGNOSIS — I998 Other disorder of circulatory system: Secondary | ICD-10-CM | POA: Diagnosis not present

## 2022-07-30 DIAGNOSIS — G9340 Encephalopathy, unspecified: Secondary | ICD-10-CM | POA: Diagnosis not present

## 2022-07-30 DIAGNOSIS — L03115 Cellulitis of right lower limb: Secondary | ICD-10-CM | POA: Diagnosis not present

## 2022-07-30 DIAGNOSIS — I70261 Atherosclerosis of native arteries of extremities with gangrene, right leg: Secondary | ICD-10-CM | POA: Diagnosis not present

## 2022-07-30 HISTORY — PX: AMPUTATION: SHX166

## 2022-07-30 LAB — BASIC METABOLIC PANEL
Anion gap: 9 (ref 5–15)
BUN: 7 mg/dL (ref 6–20)
CO2: 23 mmol/L (ref 22–32)
Calcium: 9 mg/dL (ref 8.9–10.3)
Chloride: 106 mmol/L (ref 98–111)
Creatinine, Ser: 0.69 mg/dL (ref 0.61–1.24)
GFR, Estimated: >60 mL/min (ref >60)
Glucose, Bld: 114 mg/dL — ABNORMAL HIGH (ref 70–99)
Potassium: 3.8 mmol/L (ref 3.5–5.1)
Sodium: 138 mmol/L (ref 135–145)

## 2022-07-30 LAB — CBC
HCT: 35.9 % — ABNORMAL LOW (ref 39.0–52.0)
Hemoglobin: 12.2 g/dL — ABNORMAL LOW (ref 13.0–17.0)
MCH: 32.4 pg (ref 26.0–34.0)
MCHC: 34 g/dL (ref 30.0–36.0)
MCV: 95.2 fL (ref 80.0–100.0)
Platelets: 269 10*3/uL (ref 150–400)
RBC: 3.77 MIL/uL — ABNORMAL LOW (ref 4.22–5.81)
RDW: 13.9 % (ref 11.5–15.5)
WBC: 10.3 10*3/uL (ref 4.0–10.5)
nRBC: 0 % (ref 0.0–0.2)

## 2022-07-30 SURGERY — AMPUTATION, ABOVE KNEE
Anesthesia: General | Site: Knee | Laterality: Right

## 2022-07-30 MED ORDER — PROPOFOL 10 MG/ML IV BOLUS
INTRAVENOUS | Status: DC | PRN
  Administered 2022-07-30: 100 mg via INTRAVENOUS

## 2022-07-30 MED ORDER — MIDAZOLAM HCL 2 MG/2ML IJ SOLN
INTRAMUSCULAR | Status: DC | PRN
  Administered 2022-07-30: 1 mg via INTRAVENOUS

## 2022-07-30 MED ORDER — AMISULPRIDE (ANTIEMETIC) 5 MG/2ML IV SOLN: 10.0000 mg | Freq: Once | INTRAVENOUS | Status: DC | PRN

## 2022-07-30 MED ORDER — CHLORHEXIDINE GLUCONATE 0.12 % MT SOLN
OROMUCOSAL | Status: AC
  Filled 2022-07-30: qty 15

## 2022-07-30 MED ORDER — LACTATED RINGERS IV SOLN: INTRAVENOUS | Status: DC | PRN

## 2022-07-30 MED ORDER — FENTANYL CITRATE (PF) 250 MCG/5ML IJ SOLN
INTRAMUSCULAR | Status: DC | PRN
  Administered 2022-07-30 (×2): 50 ug via INTRAVENOUS
  Administered 2022-07-30: 100 ug via INTRAVENOUS

## 2022-07-30 MED ORDER — OXYCODONE HCL 5 MG PO TABS: 5.0000 mg | ORAL_TABLET | Freq: Once | ORAL | Status: DC | PRN

## 2022-07-30 MED ORDER — POTASSIUM CHLORIDE CRYS ER 20 MEQ PO TBCR: 20.0000 meq | EXTENDED_RELEASE_TABLET | Freq: Every day | ORAL | Status: DC | PRN

## 2022-07-30 MED ORDER — ALUM & MAG HYDROXIDE-SIMETH 200-200-20 MG/5ML PO SUSP: 15.0000 mL | ORAL | Status: DC | PRN

## 2022-07-30 MED ORDER — SUGAMMADEX SODIUM 200 MG/2ML IV SOLN
INTRAVENOUS | Status: DC | PRN
  Administered 2022-07-30: 220 mg via INTRAVENOUS

## 2022-07-30 MED ORDER — FENTANYL CITRATE (PF) 100 MCG/2ML IJ SOLN: 25.0000 ug | INTRAMUSCULAR | Status: DC | PRN

## 2022-07-30 MED ORDER — SENNOSIDES-DOCUSATE SODIUM 8.6-50 MG PO TABS: 1.0000 | ORAL_TABLET | Freq: Every evening | ORAL | Status: DC | PRN

## 2022-07-30 MED ORDER — GUAIFENESIN-DM 100-10 MG/5ML PO SYRP: 15.0000 mL | ORAL_SOLUTION | ORAL | Status: DC | PRN

## 2022-07-30 MED ORDER — PROPOFOL 10 MG/ML IV BOLUS
INTRAVENOUS | Status: AC
  Filled 2022-07-30: qty 20

## 2022-07-30 MED ORDER — PHENYLEPHRINE 80 MCG/ML (10ML) SYRINGE FOR IV PUSH (FOR BLOOD PRESSURE SUPPORT)
PREFILLED_SYRINGE | INTRAVENOUS | Status: DC | PRN
  Administered 2022-07-30 (×4): 160 ug via INTRAVENOUS

## 2022-07-30 MED ORDER — LIDOCAINE 2% (20 MG/ML) 5 ML SYRINGE
INTRAMUSCULAR | Status: AC
  Filled 2022-07-30: qty 5

## 2022-07-30 MED ORDER — MAGNESIUM SULFATE 2 GM/50ML IV SOLN: 2.0000 g | Freq: Every day | INTRAVENOUS | Status: DC | PRN

## 2022-07-30 MED ORDER — ACETAMINOPHEN 500 MG PO TABS: 500.0000 mg | ORAL_TABLET | Freq: Four times a day (QID) | ORAL | Status: DC | PRN

## 2022-07-30 MED ORDER — METOPROLOL TARTRATE 5 MG/5ML IV SOLN
2.0000 mg | INTRAVENOUS | Status: DC | PRN
  Administered 2022-07-31: 5 mg via INTRAVENOUS
  Filled 2022-07-30: qty 5

## 2022-07-30 MED ORDER — ONDANSETRON HCL 4 MG/2ML IJ SOLN
INTRAMUSCULAR | Status: DC | PRN
  Administered 2022-07-30: 4 mg via INTRAVENOUS

## 2022-07-30 MED ORDER — HYDROCODONE-ACETAMINOPHEN 7.5-325 MG PO TABS
1.0000 | ORAL_TABLET | ORAL | Status: DC | PRN
  Administered 2022-07-30: 1 via ORAL
  Filled 2022-07-30: qty 1

## 2022-07-30 MED ORDER — LABETALOL HCL 5 MG/ML IV SOLN: 10.0000 mg | INTRAVENOUS | Status: DC | PRN

## 2022-07-30 MED ORDER — ROCURONIUM BROMIDE 10 MG/ML (PF) SYRINGE
PREFILLED_SYRINGE | INTRAVENOUS | Status: DC | PRN
  Administered 2022-07-30: 40 mg via INTRAVENOUS
  Administered 2022-07-30: 20 mg via INTRAVENOUS

## 2022-07-30 MED ORDER — ACETAMINOPHEN 325 MG PO TABS: 325.0000 mg | ORAL_TABLET | Freq: Four times a day (QID) | ORAL | Status: DC | PRN

## 2022-07-30 MED ORDER — 0.9 % SODIUM CHLORIDE (POUR BTL) OPTIME
TOPICAL | Status: DC | PRN
  Administered 2022-07-30: 1000 mL

## 2022-07-30 MED ORDER — PHENOL 1.4 % MT LIQD: 1.0000 | OROMUCOSAL | Status: DC | PRN

## 2022-07-30 MED ORDER — PANTOPRAZOLE SODIUM 40 MG PO TBEC
40.0000 mg | DELAYED_RELEASE_TABLET | Freq: Every day | ORAL | Status: DC
  Administered 2022-07-30 – 2022-08-07 (×9): 40 mg via ORAL
  Filled 2022-07-30 (×9): qty 1

## 2022-07-30 MED ORDER — MIDAZOLAM HCL 2 MG/2ML IJ SOLN
INTRAMUSCULAR | Status: AC
  Filled 2022-07-30: qty 2

## 2022-07-30 MED ORDER — DOCUSATE SODIUM 100 MG PO CAPS
100.0000 mg | ORAL_CAPSULE | Freq: Every day | ORAL | Status: DC
  Administered 2022-07-31 – 2022-08-05 (×6): 100 mg via ORAL
  Filled 2022-07-30 (×6): qty 1

## 2022-07-30 MED ORDER — AMLODIPINE BESYLATE 10 MG PO TABS
10.0000 mg | ORAL_TABLET | Freq: Every day | ORAL | Status: DC
  Administered 2022-07-30 – 2022-08-07 (×9): 10 mg via ORAL
  Filled 2022-07-30 (×9): qty 1

## 2022-07-30 MED ORDER — LIDOCAINE 2% (20 MG/ML) 5 ML SYRINGE
INTRAMUSCULAR | Status: DC | PRN
  Administered 2022-07-30: 60 mg via INTRAVENOUS

## 2022-07-30 MED ORDER — OXYCODONE HCL 5 MG/5ML PO SOLN: 5.0000 mg | Freq: Once | ORAL | Status: DC | PRN

## 2022-07-30 MED ORDER — BISACODYL 5 MG PO TBEC: 5.0000 mg | DELAYED_RELEASE_TABLET | Freq: Every day | ORAL | Status: DC | PRN

## 2022-07-30 MED ORDER — ROCURONIUM BROMIDE 10 MG/ML (PF) SYRINGE
PREFILLED_SYRINGE | INTRAVENOUS | Status: AC
  Filled 2022-07-30: qty 10

## 2022-07-30 MED ORDER — BUPIVACAINE HCL (PF) 0.5 % IJ SOLN
INTRAMUSCULAR | Status: AC
  Filled 2022-07-30: qty 30

## 2022-07-30 MED ORDER — PROMETHAZINE HCL 25 MG/ML IJ SOLN: 6.2500 mg | INTRAMUSCULAR | Status: DC | PRN

## 2022-07-30 MED ORDER — HYDRALAZINE HCL 20 MG/ML IJ SOLN: 5.0000 mg | INTRAMUSCULAR | Status: DC | PRN

## 2022-07-30 MED ORDER — FENTANYL CITRATE (PF) 250 MCG/5ML IJ SOLN
INTRAMUSCULAR | Status: AC
  Filled 2022-07-30: qty 5

## 2022-07-30 MED ORDER — ACETAMINOPHEN 10 MG/ML IV SOLN: 1000.0000 mg | Freq: Once | INTRAVENOUS | Status: DC | PRN

## 2022-07-30 MED ORDER — DIPHENHYDRAMINE HCL 12.5 MG/5ML PO ELIX: 12.5000 mg | ORAL_SOLUTION | ORAL | Status: DC | PRN

## 2022-07-30 MED ORDER — MORPHINE SULFATE (PF) 2 MG/ML IV SOLN
0.5000 mg | INTRAVENOUS | Status: DC | PRN
  Administered 2022-08-02: 0.5 mg via INTRAVENOUS
  Administered 2022-08-07: 1 mg via INTRAVENOUS
  Filled 2022-07-30 (×3): qty 1

## 2022-07-30 MED ORDER — PHENYLEPHRINE HCL (PRESSORS) 10 MG/ML IV SOLN
INTRAVENOUS | Status: AC
  Filled 2022-07-30: qty 2

## 2022-07-30 MED ORDER — ONDANSETRON HCL 4 MG/2ML IJ SOLN
INTRAMUSCULAR | Status: AC
  Filled 2022-07-30: qty 2

## 2022-07-30 MED ORDER — BUPIVACAINE LIPOSOME 1.3 % IJ SUSP
INTRAMUSCULAR | Status: AC
  Filled 2022-07-30: qty 20

## 2022-07-30 MED ORDER — HYDROCODONE-ACETAMINOPHEN 5-325 MG PO TABS: 1.0000 | ORAL_TABLET | ORAL | Status: DC | PRN

## 2022-07-30 MED ORDER — BUPIVACAINE LIPOSOME 1.3 % IJ SUSP
INTRAMUSCULAR | Status: DC | PRN
  Administered 2022-07-30: 100 mL

## 2022-07-30 MED ORDER — ONDANSETRON HCL 4 MG/2ML IJ SOLN
4.0000 mg | Freq: Four times a day (QID) | INTRAMUSCULAR | Status: DC | PRN
  Filled 2022-07-30: qty 2

## 2022-07-30 SURGICAL SUPPLY — 64 items
BAG COUNTER SPONGE SURGICOUNT (BAG) ×1 IMPLANT
BAG SPNG CNTER NS LX DISP (BAG) ×1
BANDAGE ESMARK 6X9 LF (GAUZE/BANDAGES/DRESSINGS) ×1 IMPLANT
BLADE SAGITTAL (BLADE)
BLADE SAGITTAL 25.0X1.19X90 (BLADE) IMPLANT
BLADE SAW GIGLI 510 (BLADE) ×1 IMPLANT
BLADE SAW THK.89X75X18XSGTL (BLADE) IMPLANT
BNDG CMPR 5X6 CHSV STRCH STRL (GAUZE/BANDAGES/DRESSINGS) ×1
BNDG CMPR 9X6 STRL LF SNTH (GAUZE/BANDAGES/DRESSINGS)
BNDG COHESIVE 6X5 TAN ST LF (GAUZE/BANDAGES/DRESSINGS) ×1 IMPLANT
BNDG ELASTIC 4X5.8 VLCR STR LF (GAUZE/BANDAGES/DRESSINGS) ×1 IMPLANT
BNDG ELASTIC 6X5.8 VLCR STR LF (GAUZE/BANDAGES/DRESSINGS) ×1 IMPLANT
BNDG ESMARK 6X9 LF (GAUZE/BANDAGES/DRESSINGS)
BNDG GAUZE DERMACEA FLUFF 4 (GAUZE/BANDAGES/DRESSINGS) ×1 IMPLANT
BNDG GZE DERMACEA 4 6PLY (GAUZE/BANDAGES/DRESSINGS) ×1
CANISTER SUCT 3000ML PPV (MISCELLANEOUS) ×1 IMPLANT
CLIP LIGATING EXTRA MED SLVR (CLIP) ×1 IMPLANT
CLIP LIGATING EXTRA SM BLUE (MISCELLANEOUS) ×1 IMPLANT
CLIP TI MEDIUM 6 (CLIP) IMPLANT
COVER SURGICAL LIGHT HANDLE (MISCELLANEOUS) ×1 IMPLANT
CUFF TOURN SGL QUICK 24 (TOURNIQUET CUFF)
CUFF TOURN SGL QUICK 34 (TOURNIQUET CUFF)
CUFF TRNQT CYL 24X4X16.5-23 (TOURNIQUET CUFF) IMPLANT
CUFF TRNQT CYL 34X4.125X (TOURNIQUET CUFF) IMPLANT
DRAIN CHANNEL 19F RND (DRAIN) IMPLANT
DRAPE DERMATAC (DRAPES) IMPLANT
DRAPE HALF SHEET 40X57 (DRAPES) ×1 IMPLANT
DRAPE INCISE IOBAN 66X45 STRL (DRAPES) IMPLANT
DRAPE ORTHO SPLIT 77X108 STRL (DRAPES) ×2
DRAPE SURG ORHT 6 SPLT 77X108 (DRAPES) ×2 IMPLANT
DRSG ADAPTIC 3X8 NADH LF (GAUZE/BANDAGES/DRESSINGS) ×1 IMPLANT
ELECT CAUTERY BLADE 6.4 (BLADE) ×1 IMPLANT
ELECT REM PT RETURN 9FT ADLT (ELECTROSURGICAL) ×1
ELECTRODE REM PT RTRN 9FT ADLT (ELECTROSURGICAL) ×1 IMPLANT
EVACUATOR SILICONE 100CC (DRAIN) IMPLANT
GAUZE SPONGE 4X4 12PLY STRL (GAUZE/BANDAGES/DRESSINGS) ×1 IMPLANT
GLOVE BIO SURGEON STRL SZ7.5 (GLOVE) ×1 IMPLANT
GOWN STRL REUS W/ TWL LRG LVL3 (GOWN DISPOSABLE) ×2 IMPLANT
GOWN STRL REUS W/ TWL XL LVL3 (GOWN DISPOSABLE) ×1 IMPLANT
GOWN STRL REUS W/TWL LRG LVL3 (GOWN DISPOSABLE) ×2
GOWN STRL REUS W/TWL XL LVL3 (GOWN DISPOSABLE) ×1
KIT BASIN OR (CUSTOM PROCEDURE TRAY) ×1 IMPLANT
KIT TURNOVER KIT B (KITS) ×1 IMPLANT
NS IRRIG 1000ML POUR BTL (IV SOLUTION) ×1 IMPLANT
PACK GENERAL/GYN (CUSTOM PROCEDURE TRAY) ×1 IMPLANT
PAD ARMBOARD 7.5X6 YLW CONV (MISCELLANEOUS) ×2 IMPLANT
PENCIL BUTTON HOLSTER BLD 10FT (ELECTRODE) IMPLANT
POWDER SURGICEL 3.0 GRAM (HEMOSTASIS) IMPLANT
STAPLER VISISTAT 35W (STAPLE) ×1 IMPLANT
STOCKINETTE IMPERVIOUS LG (DRAPES) ×1 IMPLANT
SUT ETHILON 3 0 PS 1 (SUTURE) IMPLANT
SUT SILK 0 TIES 10X30 (SUTURE) ×1 IMPLANT
SUT SILK 2 0 (SUTURE) ×1
SUT SILK 2-0 18XBRD TIE 12 (SUTURE) ×1 IMPLANT
SUT SILK 3 0 (SUTURE) ×1
SUT SILK 3-0 18XBRD TIE 12 (SUTURE) IMPLANT
SUT VIC AB 2-0 CT1 18 (SUTURE) ×2 IMPLANT
SYR BULB IRRIG 60ML STRL (SYRINGE) IMPLANT
SYR TB 1ML LUER SLIP (SYRINGE) IMPLANT
TOWEL GREEN STERILE (TOWEL DISPOSABLE) ×2 IMPLANT
TUBE CONNECTING 12X1/4 (SUCTIONS) IMPLANT
UNDERPAD 30X36 HEAVY ABSORB (UNDERPADS AND DIAPERS) ×1 IMPLANT
WATER STERILE IRR 1000ML POUR (IV SOLUTION) ×1 IMPLANT
YANKAUER SUCT BULB TIP NO VENT (SUCTIONS) IMPLANT

## 2022-07-30 NOTE — Interval H&P Note (Signed)
History and Physical Interval Note:  07/30/2022 7:55 AM  Gerald [name]  has presented today for surgery, with the diagnosis of Critical limb ischemia of right lower extremity.  The various methods of treatment have been discussed with the patient and family. After consideration of risks, benefits and other options for treatment, the patient has consented to  Procedure(s): AMPUTATION ABOVE KNEE (Right) as a surgical intervention.  The patient's history has been reviewed, patient examined, no change in status, stable for surgery.  I have reviewed the patient's chart and labs.  Questions were answered to the patient's satisfaction.     Annamarie Major

## 2022-07-30 NOTE — Evaluation (Signed)
Physical Therapy Evaluation Patient Details Name: Gerald [name] MRN: [id number] DOB: [birth year] Today's Date: 07/30/2022  History of Present Illness  Pt is a 62 y/o M admitted on 07/27/22 after presenting with c/o ~week of AMS. Pt found to have RLE critical ischemia with gangrene, RLE cellulitis, & acute encephalopathy/delirium. 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 PMH: L ACA infarct with residual R sided spastic hemiparesis, HTN, seizure disorder, PAD, critical limb ischemia of RLE, alcohol & cocaine abuse  Clinical Impression  Pt seen for PT evaluation with pt received asleep in bed, requiring extra cuing/encouragement to awaken, still lethargic throughout session, but agreeable to tx. PLOF & home set up information obtained from chart. PT attempted to assist pt with sitting EOB but unable with max assist +1 & no +2 available this time. Pt is able to assist with scooting to [facility] & participates in LLE strengthening exercises. No active movement noted in RLE. Recommend rehab in post acute setting to maximize independence with mobility, reduce fall risk, & decrease caregiver burden.    Recommendations for follow up therapy are one component of a multi-disciplinary discharge planning process, led by the attending physician.  Recommendations may be updated based on patient status, additional functional criteria and insurance authorization.  Follow Up Recommendations Acute inpatient rehab (3hours/day)      Assistance Recommended at Discharge Frequent or constant Supervision/Assistance  Patient can return home with the following  Two people to help with walking and/or transfers;Two people to help with bathing/dressing/bathroom;Help with stairs or ramp for entrance;Direct supervision/assist for medications management;Assist for transportation;Assistance with cooking/housework;Direct supervision/assist for financial management    Equipment Recommendations None recommended by PT (TBD in next venue)   Recommendations for Other Services       Functional Status Assessment Patient has had a recent decline in their functional status and demonstrates the ability to make significant improvements in function in a reasonable and predictable amount of time.     Precautions / Restrictions Precautions Precautions: Fall Precaution Comments: s/p R AKA 07/30/22 Restrictions Weight Bearing Restrictions: Yes RLE Weight Bearing: Non weight bearing      Mobility  Bed Mobility Overal bed mobility: Needs Assistance             General bed mobility comments: Attempted supine<>sit with HOB elevated, bed rails. Pt requires MAX assist to move lower body towards EOB, unable to upright trunk so assisted back supine. Pt does participate in scooting to [facility], using LUE to pull himself up with bed rails, bed in trendelenburg position but pt still requiring max assist.    Transfers                        Ambulation/Gait                  Stairs            Wheelchair Mobility    Modified Rankin (Stroke Patients Only)       Balance                                             Pertinent Vitals/Pain Pain Assessment Pain Assessment: Faces Faces Pain Scale: Hurts a little bit Pain Location: generalized Pain Descriptors / Indicators: Discomfort Pain Intervention(s): Monitored during session    Home Living Family/patient expects to be discharged to:: Private residence [employer]  taken from previous entry)) Living Arrangements: Spouse/significant other Available Help at Discharge: Family;Available 24 hours/day Type of Home: Apartment Home Access: Ramped entrance       Home Layout: One level Home Equipment: Conservation officer, nature (2 wheels);BSC/3in1;Wheelchair - manual      Prior Function Prior Level of Function : Needs assist             Mobility Comments: assist with transfers to/from w/c. Able to self propel in apartment using UE/LEs (info taken from  previous entry) ADLs Comments: Spouse reports that pt requiring max assist on all ADL's recently including self feeding. (info taken from previous entry)     Hand Dominance        Extremity/Trunk Assessment   Upper Extremity Assessment Upper Extremity Assessment: RUE deficits/detail;Defer to OT evaluation;Generalized weakness RUE Deficits / Details: hx of CVA with R sided deficits    Lower Extremity Assessment Lower Extremity Assessment: RLE deficits/detail;Generalized weakness RLE Deficits / Details: no active movement noted in RLE, pt with hx of CVA with R sided deficits, residual limb wrapped in ace wrap       Communication   Communication: Expressive difficulties (Is able to state he's in [place] & reports wife's name, but responds with "yeah, okay" to most questions)  Cognition Arousal/Alertness: Awake/alert Behavior During Therapy: WFL for tasks assessed/performed Overall Cognitive Status: Within Functional Limits for tasks assessed                                 General Comments: Pt with hx of CVA but unsure of cognitive baseline & family not in room. Pt with expressive impairments. Requires extra time to follow simple commands throughout session.        General Comments      Exercises General Exercises - Lower Extremity Short Arc Quad: AROM, Strengthening, Left, 10 reps, Supine Heel Slides: AAROM, Supine, Strengthening, Left, 10 reps Hip ABduction/ADduction: AAROM, Supine, Strengthening, Left, 10 reps (hip abduction slides) Straight Leg Raises: Supine, AAROM, Strengthening, Left, 10 reps   Assessment/Plan    PT Assessment Patient needs continued PT services  PT Problem List Decreased strength;Cardiopulmonary status limiting activity;Decreased range of motion;Decreased cognition;Decreased activity tolerance;Decreased balance;Decreased safety awareness;Decreased knowledge of use of DME;Decreased mobility;Decreased knowledge of precautions;Decreased skin  integrity       PT Treatment Interventions Therapeutic exercise;DME instruction;Balance training;Gait training;Stair training;Neuromuscular re-education;Functional mobility training;Therapeutic activities;Patient/family education;Modalities;Manual techniques;Wheelchair mobility training    PT Goals (Current goals can be found in the Care Plan section)  Acute Rehab PT Goals Patient Stated Goal: none stated PT Goal Formulation: With patient Time For Goal Achievement: 08/13/22 Potential to Achieve Goals: Fair    Frequency Min 3X/week     Co-evaluation               AM-PAC PT "6 Clicks" Mobility  Outcome Measure Help needed turning from your back to your side while in a flat bed without using bedrails?: Total Help needed moving from lying on your back to sitting on the side of a flat bed without using bedrails?: Total Help needed moving to and from a bed to a chair (including a wheelchair)?: Total Help needed standing up from a chair using your arms (e.g., wheelchair or bedside chair)?: Total Help needed to walk in hospital room?: Total Help needed climbing 3-5 steps with a railing? : Total 6 Click Score: 6    End of Session Equipment Utilized During Treatment: Oxygen Activity Tolerance:  Patient limited by lethargy Patient left: in bed;with call bell/phone within reach;with bed alarm set (4 rails up 2/2 seizure precautions & pads on bed rails) Nurse Communication: Mobility status PT Visit Diagnosis: Muscle weakness (generalized) (M62.81);Other abnormalities of gait and mobility (R26.89)    Time: 1348-1400 PT Time Calculation (min) (ACUTE ONLY): 12 min   Charges:   PT Evaluation $PT Eval High Complexity: 1 High          Lavone Nian, PT, DPT 07/30/22, 2:12 PM   Waunita Schooner 07/30/2022, 2:10 PM

## 2022-07-30 NOTE — Transfer of Care (Signed)
Immediate Anesthesia Transfer of Care Note  Patient: Gerald [name]  Procedure(s) Performed: AMPUTATION ABOVE KNEE (Right: Knee)  Patient Location: PACU  Anesthesia Type:General  Level of Consciousness: drowsy  Airway & Oxygen Therapy: Patient Spontanous Breathing and Patient connected to face mask oxygen  Post-op Assessment: Report given to RN, Post -op Vital signs reviewed and stable, and Patient moving all extremities X 4  Post vital signs: Reviewed and stable  Last Vitals:  Vitals Value Taken Time  BP 93/65 07/30/22 1008  Temp    Pulse 83 07/30/22 1009  Resp 14 07/30/22 1009  SpO2 98 % 07/30/22 1009  Vitals shown include unvalidated device data.  Last Pain:  Vitals:   07/30/22 0811  TempSrc: Oral  PainSc: 8       Patients Stated Pain Goal: 1 ([id number])  Complications: No notable events documented.

## 2022-07-30 NOTE — Progress Notes (Signed)
Inpatient Rehab Admissions Coordinator Note:  Per PT recommendations, pt was screened for candidacy for CIR by Michel Santee, PT.  At this time, pt does not appear to be able to tolerate the intensity of our CIR program at this time.  No consult recommended, however CIR will follow from a distance and if pt demonstrates improved tolerance we will request/place an order at that time per our protocol.    Shann Medal, PT, DPT [phone] 07/30/22  3:48 PM

## 2022-07-30 NOTE — Anesthesia Preprocedure Evaluation (Addendum)
Anesthesia Evaluation  Patient identified by MRN, date of birth, ID band Patient awake    Reviewed: Allergy & Precautions, NPO status , Patient's Chart, lab work & pertinent test results  Airway Mallampati: III  TM Distance: >3 FB Neck ROM: Full    Dental no notable dental hx.    Pulmonary Current Smoker and Patient abstained from smoking.   Pulmonary exam normal        Cardiovascular hypertension, Pt. on medications and Pt. on home beta blockers + Peripheral Vascular Disease  Normal cardiovascular exam     Neuro/Psych Seizures -,  PSYCHIATRIC DISORDERS Anxiety     Right-sided contracture CVA, Residual Symptoms    GI/Hepatic negative GI ROS,,,(+)     substance abuse    Endo/Other  negative endocrine ROS    Renal/GU negative Renal ROS     Musculoskeletal negative musculoskeletal ROS (+)    Abdominal   Peds  Hematology  (+) Blood dyscrasia, anemia   Anesthesia Other Findings Critical limb ischemia of right lower extremity  Reproductive/Obstetrics                             Anesthesia Physical Anesthesia Plan  ASA: 3  Anesthesia Plan: General   Post-op Pain Management:    Induction: Intravenous  PONV Risk Score and Plan: 1 and Ondansetron, Dexamethasone, Midazolam and Treatment may vary due to age or medical condition  Airway Management Planned: Oral ETT  Additional Equipment:   Intra-op Plan:   Post-operative Plan: Extubation in OR  Informed Consent: I have reviewed the patients History and Physical, chart, labs and discussed the procedure including the risks, benefits and alternatives for the proposed anesthesia with the patient or authorized representative who has indicated his/her understanding and acceptance.     Dental advisory given  Plan Discussed with: CRNA  Anesthesia Plan Comments:         Anesthesia Quick Evaluation

## 2022-07-30 NOTE — Progress Notes (Signed)
Patient off floor for procedure, wedding band given to wife, teeth brushed, chg complete

## 2022-07-30 NOTE — Discharge Instructions (Addendum)
Gerald [name], you for trusting [place] with your care. We treated you in the hospital for infection in your leg.  The surgeons removed to the infected portion. Please change gauze dressing as needed. Keep stump sock on.  He is continued to take your blood pressure, aspirin, cholesterol as well as your antiseizure medicines as necessary.  Please call the internal medicine center to schedule a follow-up appointment sometime after discharge from the rehab center.  [facility]  Discharge instructions  Lower Extremity Amputation  Please refer to the following instruction for your post-procedure care. Your surgeon or physician assistant will discuss any changes with you.  Activity  You are encouraged to walk as much as you can. You can slowly return to normal activities during the month after your surgery. Avoid strenuous activity and heavy lifting until your doctor tells you it's OK. Avoid activities such as vacuuming or swinging a golf club. Do not drive until your doctor give the OK and you are no longer taking prescription pain medications. It is also normal to have difficulty with sleep habits, eating and bowel movement after surgery. These will go away with time.  Bathing/Showering  Shower daily after you go home. Do not soak in a bathtub, hot tub, or swim until the incision heals completely.  Incision Care  Clean your incision with mild soap and water. Shower every day. Pat the area dry with a clean towel. You do not need a bandage unless otherwise instructed. Do not apply any ointments or creams to your incision. If you have open wounds you will be instructed how to care for them or a visiting nurse may be arranged for you. If you have staples or sutures along your incision they will be removed at your post-op appointment. You may have skin glue on your incision. Do not peel it off. It will come off on its own in about one week.  Diet  Resume your normal diet.  There are no special food restrictions following this procedure. A low fat/ low cholesterol diet is recommended for all patients with vascular disease. In order to heal from your surgery, it is CRITICAL to get adequate nutrition. Your body requires vitamins, minerals, and protein. Vegetables are the best source of vitamins and minerals. Vegetables also provide the perfect balance of protein. Processed food has little nutritional value, so try to avoid this.  Medications  Resume taking all your medications unless your doctor or physician assistant tells you not to. If your incision is causing pain, you may take over-the-counter pain relievers such as acetaminophen (Tylenol). If you were prescribed a stronger pain medication, please aware these medication can cause nausea and constipation. Prevent nausea by taking the medication with a snack or meal. Avoid constipation by drinking plenty of fluids and eating foods with high amount of fiber, such as fruits, vegetables, and grains. Take Colace 100 mg (an over-the-counter stool softener) twice a day as needed for constipation.  Do not take Tylenol if you are taking prescription pain medications.  Follow Up  Our office will schedule a follow up appointment 4 weeks following discharge.  Please call us immediately for any of the following conditions  Increase pain, redness, warmth, or drainage (pus) from your incision site(s) Fever of 101 degree or higher The swelling in your leg with the amputation suddenly worsens and becomes more painful than when you were in the hospital  Leg swelling is common after amputation surgery.  The swelling should improve  over a few months following surgery. To improve the swelling, you may elevate your legs above the level of your heart while you are sitting or resting. Your surgeon or physician assistant may ask you to apply an ACE wrap or wear compression (TED) stockings to help to reduce swelling.  Reduce your risk of  vascular disease  Stop smoking. If you would like help call [facility] at 1-800-QUIT-NOW 434-007-8167) or [place] at 4043546387.  Manage your cholesterol Maintain a desired weight Control your diabetes weight Control your diabetes Keep your blood pressure down  If you have any questions, please call the office at [phone]

## 2022-07-30 NOTE — Progress Notes (Addendum)
HD#3 Subjective:   Summary: Gerald [name] is a 62 y.o. 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  Overnight Events: None  He is doing well after surgery although still a little drowsy from anesthesia.  Family reports that the surgery went well.  Patient is optimistic about what comes next and has no new or worsening complaints.  Objective:  Vital signs in last 24 hours: Vitals:   07/29/22 1942 07/30/22 0006 07/30/22 0335 07/30/22 0400  BP: (!) 158/88 (!) 178/115 (!) 182/108 (!) 164/107  Pulse: 75 75 75   Resp: 18 17 17   $ Temp: 98.2 F (36.8 C) 98.4 F (36.9 C) 98.1 F (36.7 C)   TempSrc: Oral Oral Oral   SpO2: 96% 94% 96%   Height:       Supplemental O2: Room Air SpO2: 96 %   Physical Exam:  Constitutional: Chronically ill-appearing male appears older than stated age, in no acute distress Pulmonary/Chest: normal work of breathing on room air MSK: S/p right AKA with Ace wrap on stump up to groin.  Left lower extremity is warm but there is evidence of decreased blood flow including poor nail health but no active ulcers noted. Neuro: Slightly drowsy after surgery but oriented.  Chronic right-sided weakness present on exam with very limited active movement of his right upper extremity and no movement of his right lower extremity.    BP (!) 164/107 (BP Location: Left Arm) Comment: md Notified about home BP med not ordered  Pulse 75   Temp 98.1 F (36.7 C) (Oral)   Resp 17   Ht 6' (1.829 m)   SpO2 96%   BMI 32.82 kg/m     Intake/Output Summary (Last 24 hours) at 07/30/2022 0616 Last data filed at 07/30/2022  0018 Gross per 24 hour  Intake 575.07 ml  Output 1125 ml  Net -549.93 ml   Net IO Since Admission: 783.5 mL [07/30/22 0616]  Pertinent Labs:    Latest Ref Rng & Units 07/30/2022    2:05 AM 07/29/2022    7:19 AM 07/29/2022    1:52 AM  CBC  WBC 4.0 - 10.5 K/uL 10.3  8.3  9.2   Hemoglobin 13.0 - 17.0 g/dL 12.2  11.6  11.3   Hematocrit 39.0 - 52.0 % 35.9  34.9  35.1   Platelets 150 - 400 K/uL 269  256  252        Latest Ref Rng & Units 07/30/2022    2:05 AM 07/29/2022    1:52 AM 07/28/2022    4:42 AM  CMP  Glucose 70 - 99 mg/dL 114  92  102   BUN 6 - 20 mg/dL 7  12  21   $ Creatinine 0.61 - 1.24 mg/dL 0.69  0.71  0.87   Sodium 135 - 145 mmol/L 138  139  141   Potassium 3.5 - 5.1 mmol/L 3.8  3.5  4.0   Chloride 98 - 111 mmol/L 106  107  105   CO2 22 - 32 mmol/L 23  22  23   $ Calcium 8.9 - 10.3 mg/dL 9.0  8.6  9.0     Assessment/Plan:   Principal  Problem:   Critical limb ischemia of right lower extremity with gangrene [facility]) Active Problems:   Essential hypertension   Spastic hemiplegia affecting dominant side (HCC)   Seizure disorder (HCC)   History of stroke   PAD (peripheral artery disease) (HCC)   Acute delirium   Normocytic anemia   Patient Summary: Gerald [name] is a 62 y.o. 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  Blood cultures  negative at 3 days.  Will plan to take off antibiotics over the weekend. - Monitor blood cultures - Continue Vanco and cefepime - Tylenol 1 g 3 times daily - Oxycodone 5 mg every 6 hours as needed - PT recommends acute inpatient rehab, we will begin the referral process   Pre-renal AKI, resolved Patient presented with creatinine of 1.30 up from baseline of below 1. Responded well to IV fluids.   Hypokalemia, resolved Potassium 3.3 on admission and stable after supplementation.   Peripheral arterial disease Patient has critical limb ischemia of his right lower extremity as above but also has diminished/absent pulses in his left lower extremity.  We will continue to work with vascular to see if he needs any further studies of his left lower extremity or remaining vasculature after addressing his acute presentation. - Hold aspirin until after surgery   Prior left ACA CVA with residual right-sided spastic hemiplegia Seizure disorder Patient did present altered as above but does not appear to have any new focal neurologic deficits and had no evidence of intracranial hemorrhage on CT scan.  He also has been taking his antiepileptics consistently at home and has not had a seizure in at least a year. - Continue home Depakote 1500 mg daily, Vimpat 50 mg twice daily, and Keppra 1500 mg twice daily  Normocytic anemia Patient has developed anemia after presentation with hemoglobin of 14 down to 11.6 here. Today up to 12.2.  He has gotten a good amount of fluids and is in an acute state of inflammation.  Iron studies did not show any iron deficiency.  Last colonoscopy in 2020 showed 1 small tubular adenoma.  We will continue to monitor this.  Hypertension Blood pressure has been elevated since admission with systolics in the 123456 overnight.  He is on amlodipine/olmesartan 10/40 mg daily at home. - Restart amlodipine 10 mg daily  Diet: Heart Healthy IVF: None, VTE: Enoxaparin Code: Full Family  Update: Family updated in room.   Dispo: Pending PT/OT evaluation following planned right AKA on 2/16.   Johny Blamer, DO Internal Medicine Resident PGY-1 Pager: [phone]  Please contact the on call pager after 5 pm and on weekends at [phone].

## 2022-07-30 NOTE — Op Note (Signed)
    Patient name: JWAN MILLAY MRN: [id number] DOB: 04-28-1961 Sex: male  07/30/2022 Pre-operative Diagnosis: ischemic right foot Post-operative diagnosis:  Same Surgeon:  Annamarie Major Assistants:  Ivin Booty, PA Procedure:   Right above knee amputation Anesthesia:  General Blood Loss:  50cc Specimens:  right leg  Findings:  viable  tissue at amputation site  Indications: This is a 62 year old gentleman with a nonviable right foot and chronic contracture secondary to nonuse from a stroke who is here today for amputation.  Procedure:  The patient was identified in the holding area and taken to [place] [facility]  The patient was then placed supine on the table. general anesthesia was administered.  The patient was prepped and draped in the usual sterile fashion.  A time out was called and antibiotics were administered.  A PA was necessary to expedite the procedure and assist with technical details.  She help with exposure by providing suction and retraction.  She helped with wound closure.  A fishmouth incision was made just proximal to the patella.  Cautery was used about subcutaneous tissue and fascia.  The muscle was then divided.  I identified the femoral nerve and divided between vascular clamps.  Next the artery and vein were divided between clamps.  A Gigli saw was then used to transect the femur, beveling the anterior surface.  A rasp was used to smooth the bone edge.  I then infiltrated the nerve with Exparel.  It was then ligated proximal to the cut edge of the femur.  Next the artery and vein were individually ligated, proximal to the cut edge of the femur.  The wound was then irrigated.  Hemostasis was achieved.  Exparel was infiltrated throughout the wound bed.  The fascia was then reapproximated with interrupted 2-0 Vicryl.  The skin was closed with staples.  Sterile dressings were applied.  There were no immediate complications.   Disposition: To PACU stable.   Theotis Burrow,  M.D., [facility] [place] Office: [phone] Pager:  [phone]

## 2022-07-30 NOTE — Anesthesia Procedure Notes (Signed)
Procedure Name: Intubation Date/Time: 07/30/2022 9:02 AM  Performed by: Niel Hummer, CRNAPre-anesthesia Checklist: Patient identified, Emergency Drugs available, Suction available and Patient being monitored Patient Re-evaluated:Patient Re-evaluated prior to induction Oxygen Delivery Method: Circle system utilized Preoxygenation: Pre-oxygenation with 100% oxygen Induction Type: IV induction Ventilation: Mask ventilation without difficulty Laryngoscope Size: Mac and 4 Grade View: Grade I Tube type: Oral Tube size: 7.5 mm Number of attempts: 1 Airway Equipment and Method: Stylet Placement Confirmation: ETT inserted through vocal cords under direct vision, positive ETCO2 and breath sounds checked- equal and bilateral Secured at: 22 cm Tube secured with: Tape Dental Injury: Teeth and Oropharynx as per pre-operative assessment

## 2022-07-31 DIAGNOSIS — I70261 Atherosclerosis of native arteries of extremities with gangrene, right leg: Secondary | ICD-10-CM | POA: Diagnosis not present

## 2022-07-31 DIAGNOSIS — L03115 Cellulitis of right lower limb: Secondary | ICD-10-CM | POA: Diagnosis not present

## 2022-07-31 DIAGNOSIS — F17210 Nicotine dependence, cigarettes, uncomplicated: Secondary | ICD-10-CM | POA: Diagnosis not present

## 2022-07-31 DIAGNOSIS — R410 Disorientation, unspecified: Secondary | ICD-10-CM | POA: Diagnosis not present

## 2022-07-31 LAB — BASIC METABOLIC PANEL
Anion gap: 7 (ref 5–15)
BUN: 8 mg/dL (ref 6–20)
CO2: 22 mmol/L (ref 22–32)
Calcium: 8.8 mg/dL — ABNORMAL LOW (ref 8.9–10.3)
Chloride: 110 mmol/L (ref 98–111)
Creatinine, Ser: 0.72 mg/dL (ref 0.61–1.24)
GFR, Estimated: >60 mL/min (ref >60)
Glucose, Bld: 91 mg/dL (ref 70–99)
Potassium: 4.3 mmol/L (ref 3.5–5.1)
Sodium: 139 mmol/L (ref 135–145)

## 2022-07-31 LAB — CBC
HCT: 34.2 % — ABNORMAL LOW (ref 39.0–52.0)
Hemoglobin: 10.9 g/dL — ABNORMAL LOW (ref 13.0–17.0)
MCH: 31.6 pg (ref 26.0–34.0)
MCHC: 31.9 g/dL (ref 30.0–36.0)
MCV: 99.1 fL (ref 80.0–100.0)
Platelets: 218 10*3/uL (ref 150–400)
RBC: 3.45 MIL/uL — ABNORMAL LOW (ref 4.22–5.81)
RDW: 14.3 % (ref 11.5–15.5)
WBC: 12.4 10*3/uL — ABNORMAL HIGH (ref 4.0–10.5)
nRBC: 0 % (ref 0.0–0.2)

## 2022-07-31 MED ORDER — ACETAMINOPHEN 500 MG PO TABS
1000.0000 mg | ORAL_TABLET | Freq: Three times a day (TID) | ORAL | Status: DC
  Administered 2022-07-31 – 2022-08-07 (×21): 1000 mg via ORAL
  Filled 2022-07-31 (×21): qty 2

## 2022-07-31 MED ORDER — OXYCODONE HCL 5 MG PO TABS
5.0000 mg | ORAL_TABLET | ORAL | Status: DC | PRN
  Administered 2022-07-31 – 2022-08-03 (×8): 5 mg via ORAL
  Filled 2022-07-31 (×8): qty 1

## 2022-07-31 MED ORDER — ACETAMINOPHEN 500 MG PO TABS: 1000.0000 mg | ORAL_TABLET | Freq: Three times a day (TID) | ORAL | Status: DC

## 2022-07-31 NOTE — Plan of Care (Signed)
  Problem: Clinical Measurements: Goal: Respiratory complications will improve Outcome: Progressing Goal: Cardiovascular complication will be avoided Outcome: Progressing   Problem: Nutrition: Goal: Adequate nutrition will be maintained Outcome: Progressing   Problem: Health Behavior/Discharge Planning: Goal: Ability to manage health-related needs will improve Outcome: Not Progressing   Problem: Activity: Goal: Risk for activity intolerance will decrease Outcome: Not Progressing

## 2022-07-31 NOTE — Progress Notes (Addendum)
[facility] [place]  Subjective  - No new complaints   Objective (!) 154/92 (!) 127 (!) 97.5 F (36.4 C) (Oral) 16 94%  Intake/Output Summary (Last 24 hours) at 07/31/2022 0804 Last data filed at 07/31/2022 0754 Gross per 24 hour  Intake 1200 ml  Output 950 ml  Net 250 ml    Right AKA healing well Dressing clean and dry Left heel floating, heel protector dressing in place.    Assessment/Planning: 62 y.o. male with critical limb ischemia of right lower extremity in non functional RLE from prior CVA  POD # 1 right AKA  Left heel protections protocol will order PRAFO/ heel protector boot. He is non ambulatory. Plan for dressing changed tomorrow   Roxy Horseman 07/31/2022 8:04 AM --  Laboratory Lab Results: Recent Labs    07/30/22 0205 07/31/22 0130  WBC 10.3 12.4*  HGB 12.2* 10.9*  HCT 35.9* 34.2*  PLT 269 218   BMET Recent Labs    07/30/22 0205 07/31/22 0130  NA 138 139  K 3.8 4.3  CL 106 110  CO2 23 22  GLUCOSE 114* 91  BUN 7 8  CREATININE 0.69 0.72  CALCIUM 9.0 8.8*    COAG Lab Results  Component Value Date   INR 1.2 07/27/2022   INR 1.7 (H) 05/27/2022   INR 1.20 05/22/2016   No results found for: "PTT"  I have interviewed the patient and examined the patient. I agree with the findings by the PA.  Dressing change tomorrow.  Gae Gallop, MD

## 2022-07-31 NOTE — Progress Notes (Signed)
HD#4 Subjective:   Summary: Gerald [name] is a 62 y.o. 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  Overnight Events: None  Today he is on 1-2 L of O2 through [place]. He does not complain of any dyspnea and cannot remember when they put it on him. He did not have it on yesterday after surgery. He is doing well overall without any significant pain in his R AKA stump. His wife is at bedside and states that he sometimes says it hurts to her.   Objective:  Vital signs in last 24 hours: Vitals:   07/30/22 1400 07/30/22 1507 07/30/22 2008 07/30/22 2257  BP: (!) 131/91 111/83 133/88 (!) 129/93  Pulse:  85 95 90  Resp:  19 20 18  $ Temp:  97.8 F (36.6 C) 98 F (36.7 C) 98.2 F (36.8 C)  TempSrc:  Oral Oral Oral  SpO2: 93% 96% 98% 94%  Height:       Supplemental O2: Nasal Cannula SpO2: 94 % O2 Flow Rate (L/min): 2 L/min   Physical Exam:  Constitutional: Chronically ill-appearing male appears older than stated age, in no acute distress Pulmonary/Chest: Clear to auscultation bilaterally MSK: S/p right AKA with Ace wrap on stump up to groin.  Left lower extremity is warm but there is evidence of decreased blood flow including poor nail health but no active ulcers noted. Heel pad dressing in place. Neuro: Slightly drowsy but alert and oriented to person and place, improved from initial presentation.  Chronic right-sided weakness present on exam with very limited active movement of his right upper extremity and no movement of his right lower extremity.   BP (!) 129/93 (BP Location: Right Arm)   Pulse 90   Temp 98.2 F (36.8 C)  (Oral)   Resp 18   Ht 6' (1.829 m)   SpO2 94%   BMI 32.82 kg/m     Intake/Output Summary (Last 24 hours) at 07/31/2022 0613 Last data filed at 07/31/2022 0300 Gross per 24 hour  Intake 1200 ml  Output 350 ml  Net 850 ml   Net IO Since Admission: 1,633.5 mL [07/31/22 0613]  Pertinent Labs:    Latest Ref Rng & Units 07/31/2022    1:30 AM 07/30/2022    2:05 AM 07/29/2022    7:19 AM  CBC  WBC 4.0 - 10.5 K/uL 12.4  10.3  8.3   Hemoglobin 13.0 - 17.0 g/dL 10.9  12.2  11.6   Hematocrit 39.0 - 52.0 % 34.2  35.9  34.9   Platelets 150 - 400 K/uL 218  269  256        Latest Ref Rng & Units 07/31/2022    1:30 AM 07/30/2022    2:05 AM 07/29/2022    1:52 AM  CMP  Glucose 70 - 99 mg/dL 91  114  92   BUN 6 - 20 mg/dL 8  7  12   $ Creatinine 0.61 - 1.24 mg/dL 0.72  0.69  0.71   Sodium 135 - 145 mmol/L 139  138  139   Potassium 3.5 - 5.1 mmol/L 4.3  3.8  3.5   Chloride 98 - 111 mmol/L 110  106  107   CO2 22 - 32 mmol/L 22  23  22   $ Calcium 8.9 - 10.3 mg/dL 8.8  9.0  8.6     Assessment/Plan:   Principal Problem:   Critical limb ischemia of right lower extremity with gangrene (HCC) Active Problems:   Essential hypertension   Spastic hemiplegia affecting dominant side (HCC)   Seizure disorder (HCC)   History of stroke   PAD (peripheral artery disease) (HCC)   Acute delirium   Normocytic anemia   Patient Summary: Gerald [name] is a 62 y.o. 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  S/p right AKA 07/30/2022 Right lower extremity critical ischemia with gangrene Right lower extremity cellulitis Underwent right AKA 2/16 and is doing well  postop. Having some delirium this morning and not able to fully remember what happened yesterday.  Blood cultures negative at 3 days, afebrile, but tachycardic, on supplemental oxygen, and leukocytosis after surgery.  I think some of the tachycardia could be pain related but we will closely monitor for fevers on top of treating his pain.  Right AKA dressing appears clean and will be changed tomorrow.  He has been able to void after surgery as well and bladder scan this morning shows 76 mL.  We will continue to monitor his urine output to ensure is adequate but he is not retaining. - Monitor blood cultures - Continue Vanco and cefepime - Tylenol 1 g 3 times daily with temperature checks prior - Oxycodone 5 mg every 6 hours as needed - PT recommends acute inpatient rehab, we will begin the referral process  Delirium 2/2 infection, (increased risk due to prior CVA, and hx of seizures) He appears a little more confused today than yesterday but is definitely improved from admission. Alert and oriented to person and place which appears to be baseline for him. He is at risk for delirium exacerbation after surgery, having pain, and just being in the hospital. He is also at risk for an infection. We will monitor closely as above and treat his pain.    Pre-renal AKI, resolved Patient presented with creatinine of 1.30 up from baseline of below 1. Responded well to IV fluids.   Hypokalemia, resolved Potassium 3.3 on admission and stable after supplementation.   Peripheral arterial disease Patient has critical limb ischemia of his right lower extremity as above but also has diminished/absent pulses in his left lower extremity.  We will continue to work with vascular to see if he needs any further studies of his left lower extremity or remaining vasculature after addressing his acute presentation. -hold aspirin and resume if Hgb stable or improved tomorrow   Prior left ACA CVA with residual right-sided spastic  hemiplegia Seizure disorder Patient did present altered as above but does not appear to have any new focal neurologic deficits and had no evidence of intracranial hemorrhage on CT scan.  He also has been taking his antiepileptics consistently at home and has not had a seizure in at least a year. - Continue home Depakote 1500 mg daily, Vimpat 50 mg twice daily, and Keppra 1500 mg twice daily  Normocytic anemia Patient has developed anemia after presentation with hemoglobin of 14 down to  11.6. He has gotten a good amount of fluids and is in an acute state of inflammation.  Iron studies did not show any iron deficiency.  Last colonoscopy in 2020 showed 1 small tubular adenoma.  We will continue to monitor this.  Hypertension Blood pressure has been elevated since admission with systolics in the 123456 overnight.  He is on amlodipine/olmesartan 10/40 mg daily at home.  Hypertension this morning with systolics in the [id number].  If he remains elevated during the day we will add his home olmesartan. - Continue amlodipine 10 mg daily  Diet: Heart Healthy IVF: None, VTE: Enoxaparin Code: Full Family Update: Family updated in room.   Dispo: SNF versus CIR for rehab pending further PT evaluation and recovery from surgery.   Johny Blamer, DO Internal Medicine Resident PGY-1 Pager: [phone]  Please contact the on call pager after 5 pm and on weekends at [phone].

## 2022-07-31 NOTE — Plan of Care (Signed)
  Problem: Clinical Measurements: Goal: Diagnostic test results will improve Outcome: Progressing Goal: Respiratory complications will improve Outcome: Progressing   Problem: Health Behavior/Discharge Planning: Goal: Ability to manage health-related needs will improve Outcome: Not Progressing

## 2022-07-31 NOTE — Anesthesia Postprocedure Evaluation (Signed)
Anesthesia Post Note  Patient: Gerald [name]  Procedure(s) Performed: AMPUTATION ABOVE KNEE (Right: Knee)     Patient location during evaluation: PACU Anesthesia Type: General Level of consciousness: awake Pain management: pain level controlled Vital Signs Assessment: post-procedure vital signs reviewed and stable Respiratory status: spontaneous breathing, nonlabored ventilation and respiratory function stable Cardiovascular status: blood pressure returned to baseline and stable Postop Assessment: no apparent nausea or vomiting Anesthetic complications: no   No notable events documented.  Last Vitals:  Vitals:   07/30/22 2008 07/30/22 2257  BP: 133/88 (!) 129/93  Pulse: 95 90  Resp: 20 18  Temp: 36.7 C 36.8 C  SpO2: 98% 94%    Last Pain:  Vitals:   07/31/22 0300  TempSrc:   PainSc: Asleep                 Ritter Helsley P Darragh Nay

## 2022-07-31 NOTE — Evaluation (Signed)
Occupational Therapy Evaluation Patient Details Name: Gerald [name] MRN: [id number] DOB: 1961/04/14 Today's Date: 07/31/2022   History of Present Illness Pt is a 62 y/o M admitted on 07/27/22 after presenting with c/o ~week of AMS. Pt found to have RLE critical ischemia with gangrene, RLE cellulitis, & acute encephalopathy/delirium. 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 Was cooperative with OT evaluation. Pt. Was able to follow basic 1 step directions. Pt. Has decreased function in R UE since CVA in 2017. Per Pt. Wife prior to illness in December 2017 pt. Was Mod I with ADLs. Pt. Now with a new LE amputation and decreased strength. Pt. Is now a 2 person assist with ADL and mobility. Pt. Will need either CIR or ST SNF at dc. Acute OT to follow.      Recommendations for follow up therapy are one component of a multi-disciplinary discharge planning process, led by the attending physician.  Recommendations may be updated based on patient status, additional functional criteria and insurance authorization.   Follow Up Recommendations        Assistance Recommended at Discharge  (either cir or st snf)  Patient can return home with the following Two people to help with walking and/or transfers;Two people to help with bathing/dressing/bathroom;Assistance with cooking/housework;Assistance with feeding;Direct supervision/assist for medications management    Functional Status Assessment  Patient has had a recent decline in their functional status and demonstrates the ability to make significant improvements in function in a reasonable and predictable amount of time.  Equipment Recommendations   (to be determined at next venue of care.)    Recommendations for Other Services       Precautions / Restrictions Precautions Precautions:  Fall Precaution Comments: s/p R AKA 07/30/22 Restrictions Weight Bearing Restrictions: Yes RLE Weight Bearing: Non weight bearing      Mobility Bed Mobility Overal bed mobility: Needs Assistance Bed Mobility: Supine to Sit     Supine to sit:  (unable to come into full sit. will need to person assist.)          Transfers                          Balance                                           ADL either performed or assessed with clinical judgement   ADL Overall ADL's : Needs assistance/impaired Eating/Feeding: Maximal assistance;Sitting;Bed level   Grooming: Wash/dry hands;Wash/dry face;Minimal assistance;Bed level   Upper Body Bathing: Maximal assistance;Bed level   Lower Body Bathing: Total assistance;Bed level   Upper Body Dressing : Maximal assistance;Bed level   Lower Body Dressing: Total assistance;Bed level                 General ADL Comments: Pt. is a 2 person assist and ADLs performed bed level.     Vision Baseline Vision/History: 0 No visual deficits Ability to See in Adequate Light: 0 Adequate Patient Visual Report: No change from baseline Vision Assessment?: No apparent visual deficits     Perception     Praxis      Pertinent Vitals/Pain Pain Assessment Pain Assessment: No/denies  pain     Hand Dominance Right   Extremity/Trunk Assessment Upper Extremity Assessment Upper Extremity Assessment: RUE deficits/detail RUE Deficits / Details: cva 2017. spasitic. r shld flex 0. able to flex elbow and lacks full ext. pnt is able to mostly fully flex and ext hand. RUE Coordination: decreased fine motor   Lower Extremity Assessment Lower Extremity Assessment: Defer to PT evaluation       Communication Communication Communication: Expressive difficulties (Is able to state he's in [place] & reports wife's name, but responds with "yeah, okay" to most questions)   Cognition Arousal/Alertness:  Awake/alert Behavior During Therapy: WFL for tasks assessed/performed Overall Cognitive Status: Within Functional Limits for tasks assessed                                       General Comments       Exercises     Shoulder Instructions      Home Living Family/patient expects to be discharged to:: Private residence ((info taken from previous entry)) Living Arrangements: Spouse/significant other Available Help at Discharge: Family;Available 24 hours/day Type of Home: Apartment Home Access: Ramped entrance     Home Layout: One level               Home Equipment: Conservation officer, nature (2 wheels);BSC/3in1;Wheelchair - manual;Shower seat;Grab bars - tub/shower;Grab bars - toilet;Hand held shower head          Prior Functioning/Environment Prior Level of Function : Needs assist             Mobility Comments: assist with transfers to/from w/c. Able to self propel in apartment using UE/LEs (info taken from previous entry) ADLs Comments: spouse reports pt. was Mod I with ADLs prior to december 2023.        OT Problem List: Decreased strength;Decreased range of motion;Decreased activity tolerance;Impaired balance (sitting and/or standing);Decreased coordination;Decreased safety awareness;Decreased knowledge of use of DME or AE;Decreased knowledge of precautions;Impaired tone      OT Treatment/Interventions: Self-care/ADL training;Therapeutic exercise;DME and/or AE instruction;Therapeutic activities;Patient/family education    OT Goals(Current goals can be found in the care plan section) Acute Rehab OT Goals Patient Stated Goal: get stronger OT Goal Formulation: With patient Time For Goal Achievement: 08/14/22 Potential to Achieve Goals: Good ADL Goals Pt Will Perform Eating: with min assist;sitting Pt Will Perform Grooming: with supervision;sitting Pt Will Perform Upper Body Bathing: with mod assist;bed level;sitting Pt Will Perform Lower Body Bathing:  with mod assist;sitting/lateral leans;bed level;with adaptive equipment Pt Will Perform Upper Body Dressing: with min assist;sitting Pt Will Perform Lower Body Dressing: with mod assist;sitting/lateral leans;with adaptive equipment Pt Will Transfer to Toilet: with mod assist;squat pivot transfer;with transfer board;bedside commode  OT Frequency: Min 2X/week    Co-evaluation              AM-PAC OT "6 Clicks" Daily Activity     Outcome Measure Help from another person eating meals?: A Lot Help from another person taking care of personal grooming?: A Lot Help from another person toileting, which includes using toliet, bedpan, or urinal?: Total Help from another person bathing (including washing, rinsing, drying)?: Total Help from another person to put on and taking off regular upper body clothing?: A Lot Help from another person to put on and taking off regular lower body clothing?: Total 6 Click Score: 9   End of Session Nurse Communication:  (ok therapy)  Activity Tolerance: Patient  tolerated treatment well Patient left: in bed;with call bell/phone within reach;with family/visitor present  OT Visit Diagnosis: History of falling (Z91.81);Unsteadiness on feet (R26.81);Other abnormalities of gait and mobility (R26.89);Hemiplegia and hemiparesis Hemiplegia - Right/Left: Right Hemiplegia - dominant/non-dominant: Dominant Hemiplegia - caused by: Cerebral infarction                Time: 0927-1007 OT Time Calculation (min): 40 min Charges:  OT General Charges $OT Visit: 1 Visit OT Evaluation $OT Eval Moderate Complexity: 1 Mod OT Treatments $Self Care/Home Management : 8-22 mins  Reece Packer OT/L   Demarri Elie 07/31/2022, 10:07 AM

## 2022-07-31 NOTE — Progress Notes (Signed)
PRN med given  for HR 135

## 2022-08-01 ENCOUNTER — Encounter (HOSPITAL_COMMUNITY): Payer: Self-pay | Admitting: Surgery

## 2022-08-01 DIAGNOSIS — L03115 Cellulitis of right lower limb: Secondary | ICD-10-CM | POA: Diagnosis not present

## 2022-08-01 DIAGNOSIS — I70261 Atherosclerosis of native arteries of extremities with gangrene, right leg: Secondary | ICD-10-CM | POA: Diagnosis not present

## 2022-08-01 LAB — BASIC METABOLIC PANEL
Anion gap: 7 (ref 5–15)
BUN: 9 mg/dL (ref 6–20)
CO2: 24 mmol/L (ref 22–32)
Calcium: 9 mg/dL (ref 8.9–10.3)
Chloride: 106 mmol/L (ref 98–111)
Creatinine, Ser: 0.79 mg/dL (ref 0.61–1.24)
GFR, Estimated: >60 mL/min (ref >60)
Glucose, Bld: 112 mg/dL — ABNORMAL HIGH (ref 70–99)
Potassium: 3.9 mmol/L (ref 3.5–5.1)
Sodium: 137 mmol/L (ref 135–145)

## 2022-08-01 LAB — CBC
HCT: 35.3 % — ABNORMAL LOW (ref 39.0–52.0)
Hemoglobin: 11.7 g/dL — ABNORMAL LOW (ref 13.0–17.0)
MCH: 32.1 pg (ref 26.0–34.0)
MCHC: 33.1 g/dL (ref 30.0–36.0)
MCV: 97 fL (ref 80.0–100.0)
Platelets: 253 10*3/uL (ref 150–400)
RBC: 3.64 MIL/uL — ABNORMAL LOW (ref 4.22–5.81)
RDW: 14.3 % (ref 11.5–15.5)
WBC: 16.9 10*3/uL — ABNORMAL HIGH (ref 4.0–10.5)
nRBC: 0 % (ref 0.0–0.2)

## 2022-08-01 LAB — CULTURE, BLOOD (ROUTINE X 2)
Culture: NO GROWTH
Culture: NO GROWTH
Special Requests: ADEQUATE

## 2022-08-01 NOTE — Progress Notes (Addendum)
[facility]  Subjective  - no new complaints, pain controlled.   Objective 117/79 (!) 107 98.1 F (36.7 C) (Oral) 11 96%  Intake/Output Summary (Last 24 hours) at 08/01/2022 0755 Last data filed at 07/31/2022 2236 Gross per 24 hour  Intake 0 ml  Output 200 ml  Net -200 ml      Right AKA healing well with retained staples. No erythema or edema.  Stump appears viable Lungs non labored breathing   Assessment/Planning: 62 y.o. male with critical limb ischemia of right lower extremity in non functional RLE from prior CVA  POD # 2 right AKA  Dressing changed at bedside, patient tolerated this well Clean dry dressing applied with mild ace wrap compression.  I will order retention sock from [employer]. Left heel protector to protect the heel Stable with viable appearing Right AKA stump    Roxy Horseman 08/01/2022 7:55 AM --  Laboratory Lab Results: Recent Labs    07/31/22 0130 08/01/22 0118  WBC 12.4* 16.9*  HGB 10.9* 11.7*  HCT 34.2* 35.3*  PLT 218 253   BMET Recent Labs    07/31/22 0130 08/01/22 0118  NA 139 137  K 4.3 3.9  CL 110 106  CO2 22 24  GLUCOSE 91 112*  BUN 8 9  CREATININE 0.72 0.79  CALCIUM 8.8* 9.0    COAG Lab Results  Component Value Date   INR 1.2 07/27/2022   INR 1.7 (H) 05/27/2022   INR 1.20 05/22/2016   No results found for: "PTT"  I have interviewed the patient and examined the patient. I agree with the findings by the PA.  His right AKA is healing nicely.  Gae Gallop, MD

## 2022-08-01 NOTE — Progress Notes (Signed)
HD#5 Subjective:   Summary: Gerald [name] is a 62 y.o. 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  Overnight Events: None  Patient is doing well this morning.  We saw him when he was sleeping and he woke up quickly and was alert and oriented.  He remembers his surgery and lies in the hospital which he did not yesterday.  He denies any issues overnight.  He is no longer on supplemental oxygen.  He is having some pain in his right lower extremity but it does not extending up into his hip or groin.  Objective:  Vital signs in last 24 hours: Vitals:   08/01/22 0025 08/01/22 0026 08/01/22 0510 08/01/22 0641  BP: 117/79     Pulse:  (!) 107    Resp: 10  11   Temp: 98.1 F (36.7 C)  98.1 F (36.7 C) 98.1 F (36.7 C)  TempSrc: Oral  Oral Oral  SpO2:      Height:       Supplemental O2:  Room air SpO2: 96 % O2 Flow Rate (L/min): 2 L/min   Physical Exam:  Constitutional: Chronically ill-appearing male appears older than stated age, in no acute distress Cardiac: Regular rate and rhythm, palpable left radial pulse, palpable right radial pulse but more difficult in contracted arm Pulmonary/Chest: Clear to auscultation bilaterally, normal work of breathing on room air MSK: S/p right AKA with Ace wrap on stump up to groin, no proximal erythema or edema.  Left lower extremity is warm but there is evidence of decreased blood flow including poor nail health but no active ulcers noted. Heel pad dressing in place. Neuro: Alert and oriented to person, place, and situation.  Chronic right-sided weakness present on exam with very limited  active movement of his right upper extremity and no movement of his right lower extremity.   BP 117/79 (BP Location: Right Arm)   Pulse (!) 107   Temp 98.1 F (36.7 C) (Oral)   Resp 11   Ht 6' (1.829 m)   SpO2 96%   BMI 32.82 kg/m     Intake/Output Summary (Last 24 hours) at 08/01/2022 0834 Last data filed at 07/31/2022 2236 Gross per 24 hour  Intake 0 ml  Output 200 ml  Net -200 ml   Net IO Since Admission: 833.5 mL [08/01/22 0834]  Pertinent Labs:    Latest Ref Rng & Units 08/01/2022    1:18 AM 07/31/2022    1:30 AM 07/30/2022    2:05 AM  CBC  WBC 4.0 - 10.5 K/uL 16.9  12.4  10.3   Hemoglobin 13.0 - 17.0 g/dL 11.7  10.9  12.2   Hematocrit 39.0 - 52.0 % 35.3  34.2  35.9   Platelets 150 - 400 K/uL 253  218  269        Latest Ref Rng & Units 08/01/2022    1:18 AM 07/31/2022    1:30 AM 07/30/2022    2:05 AM  CMP  Glucose 70 - 99 mg/dL 112  91  114   BUN 6 - 20 mg/dL 9  8  7   $ Creatinine 0.61 - 1.24 mg/dL 0.79  0.72  0.69   Sodium 135 - 145 mmol/L 137  139  138   Potassium 3.5 - 5.1 mmol/L 3.9  4.3  3.8   Chloride 98 - 111 mmol/L 106  110  106   CO2 22 - 32 mmol/L 24  22  23   $ Calcium 8.9 - 10.3 mg/dL 9.0  8.8  9.0     Assessment/Plan:   Principal Problem:   Critical limb ischemia of right lower extremity with gangrene (HCC) Active Problems:   Essential hypertension   Spastic hemiplegia affecting dominant side (HCC)   Seizure disorder (HCC)   History of stroke   PAD (peripheral artery disease) (HCC)   Acute delirium   Normocytic anemia   Patient Summary: Gerald [name] is a 62 y.o. male with pertinent PMH of prior left ACA infarct with residual right-sided spastic hemiparesis, HTN, seizure disorder, peripheral arterial disease critical limb ischemia of his right lower extremity who presents with about a week of consistent and progressive altered mental status and is admitted for critical limb ischemia of the right lower extremity with gangrene, on hospital  day 5.  S/p right AKA 07/30/2022 Right lower extremity critical ischemia with gangrene Right lower extremity cellulitis Underwent right AKA 2/16 and is doing well postop.  Did have some delirium after surgery in the next day but this has improved.  Blood cultures negative at 4 days, afebrile, still tachycardic at that time with rates from 100-110, off supplemental oxygen.  Leukocytosis slightly worse at 16.9 today.  Stopped antibiotics on 2/16.  Vancomycin and cefepime 2/13 - 2/16.  He appears well-hydrated on exam. Right AKA dressing appears clean and will be changed tomorrow.  Overall he is improved and we are still monitoring closely for signs of infection.  - Monitor for any fever, could be masked by Tylenol - Tylenol 1 g 3 times daily with temperature checks prior - Oxycodone 5 mg every 6 hours as needed - PT evaluation initially for CIR but CIR does not feel patient is appropriate, pending reevaluation but likely will need SNF  Delirium 2/2 infection, (increased risk due to prior CVA, and hx of seizures) Mental status improved today and he is now alert and oriented to person, place, and situation.  He is at risk for delirium exacerbation after surgery, having pain, and just being in the hospital. He is also at risk for an infection. We will monitor closely as above and treat his pain.    Pre-renal AKI, resolved Patient presented with creatinine of 1.30 up from baseline of below 1. Responded well to IV fluids.   Hypokalemia, resolved Potassium 3.3 on admission and stable after supplementation.   Peripheral arterial disease Patient has critical limb ischemia of his right lower extremity as above but also has diminished/absent pulses in his left lower extremity.  We will continue to work with vascular to see if he needs any further studies of his left lower extremity or remaining vasculature after addressing his acute presentation. - continue to hold aspirin   Prior left ACA CVA with  residual right-sided spastic hemiplegia Seizure disorder Patient did present altered as above but does not appear to have any new focal neurologic deficits and had no evidence of intracranial hemorrhage on CT scan.  He also has been taking his antiepileptics consistently at home and has not had a seizure in at least a year. - Continue home Depakote 1500 mg daily, Vimpat 50 mg twice daily, and Keppra 1500 mg twice daily  Normocytic anemia  Patient has developed anemia after presentation with hemoglobin of 14 down to 11-12. Iron studies did not show any iron deficiency.  Last colonoscopy in 2020 showed 1 small tubular adenoma.  We will continue to monitor this, stable at the moment.   Hypertension Blood pressure has been elevated since admission with systolics in the [id number] overnight.  He is on amlodipine/olmesartan 10/40 mg daily at home.  Hypertension this morning with systolics in the [id number].  If he remains elevated during the day we will add his home olmesartan. - Continue amlodipine 10 mg daily  Diet: Heart Healthy IVF: None, VTE: Enoxaparin Code: Full Family Update: Family updated in room.   Dispo: SNF versus CIR for rehab pending further PT evaluation and recovery from surgery.   Johny Blamer, DO Internal Medicine Resident PGY-1 Pager: [phone]  Please contact the on call pager after 5 pm and on weekends at [phone].

## 2022-08-01 NOTE — TOC Initial Note (Signed)
Transition of Care [facility]) - Initial/Assessment Note    Patient Details  Name: Gerald [name] MRN: [id number] Date of Birth: Oct 14, 1960  [facility] CM/SW Contact:    Carles Collet, RN Phone Number: 08/01/2022, 12:50 PM  Clinical Narrative:                  Patient from home w spouse.  62 year old gentleman with a nonviable right foot and chronic contracture secondary to nonuse from a stroke. R AKA done on 2/16. Evaluated by CIR on 2/16 and did not appear to be able to tolerate the intensity of our CIR program at that time. CIR following, however patient would benefit from a SNF backup plan, depending on his progression if he is appropriate for home [facility] is also following referral from office.      Barriers to Discharge: Continued Medical Work up   Patient Goals and CMS Choice            Expected Discharge Plan and Services       Living arrangements for the past 2 months: Apartment                                      Prior Living Arrangements/Services Living arrangements for the past 2 months: Apartment Lives with:: Spouse                   Activities of Daily Living Home Assistive Devices/Equipment: Wheelchair ADL Screening (condition at time of admission) Patient's cognitive ability adequate to safely complete daily activities?: No Is the patient deaf or have difficulty hearing?: No Does the patient have difficulty seeing, even when wearing glasses/contacts?: No Does the patient have difficulty concentrating, remembering, or making decisions?: Yes Patient able to express need for assistance with ADLs?: Yes Does the patient have difficulty dressing or bathing?: Yes Independently performs ADLs?: No Does the patient have difficulty walking or climbing stairs?: Yes Weakness of Legs: Right Weakness of Arms/Hands: Right  Permission Sought/Granted                  Emotional Assessment              Admission diagnosis:   Critical limb ischemia of right lower extremity with gangrene ([place]) [I70.261] Cellulitis of foot [L03.119] Patient Active Problem List   Diagnosis Date Noted   Normocytic anemia 07/29/2022   Acute delirium 07/28/2022   Critical limb ischemia of right lower extremity with gangrene ([place]) 07/27/2022   Sepsis ([place]) 07/27/2022   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 [name] [address] Phone: [phone] Fax: [phone]     Social Determinants of Health (SDOH) Social History: SDOH Screenings   Food Insecurity: No Food Insecurity (07/29/2022)  Housing: Low Risk  (07/29/2022)  Transportation Needs: No Transportation Needs (07/29/2022)  Utilities: Not At Risk (07/29/2022)  Depression (PHQ2-9): Low  Risk  (07/15/2022)  Social Connections: Moderately Integrated (06/15/2022)  Tobacco Use: High Risk (08/01/2022)   SDOH Interventions:     Readmission Risk Interventions     No data to display

## 2022-08-02 ENCOUNTER — Inpatient Hospital Stay (HOSPITAL_COMMUNITY): Payer: PPO

## 2022-08-02 DIAGNOSIS — L03115 Cellulitis of right lower limb: Secondary | ICD-10-CM | POA: Diagnosis not present

## 2022-08-02 DIAGNOSIS — I70261 Atherosclerosis of native arteries of extremities with gangrene, right leg: Secondary | ICD-10-CM | POA: Diagnosis not present

## 2022-08-02 DIAGNOSIS — Z89611 Acquired absence of right leg above knee: Secondary | ICD-10-CM | POA: Diagnosis not present

## 2022-08-02 DIAGNOSIS — F17210 Nicotine dependence, cigarettes, uncomplicated: Secondary | ICD-10-CM | POA: Diagnosis not present

## 2022-08-02 LAB — CBC
HCT: 31.8 % — ABNORMAL LOW (ref 39.0–52.0)
Hemoglobin: 10.5 g/dL — ABNORMAL LOW (ref 13.0–17.0)
MCH: 32.2 pg (ref 26.0–34.0)
MCHC: 33 g/dL (ref 30.0–36.0)
MCV: 97.5 fL (ref 80.0–100.0)
Platelets: 237 10*3/uL (ref 150–400)
RBC: 3.26 MIL/uL — ABNORMAL LOW (ref 4.22–5.81)
RDW: 14.6 % (ref 11.5–15.5)
WBC: 14.3 10*3/uL — ABNORMAL HIGH (ref 4.0–10.5)
nRBC: 0 % (ref 0.0–0.2)

## 2022-08-02 LAB — URINALYSIS, ROUTINE W REFLEX MICROSCOPIC
Glucose, UA: 100 mg/dL — AB
Ketones, ur: NEGATIVE mg/dL
Leukocytes,Ua: NEGATIVE
Nitrite: NEGATIVE
Protein, ur: NEGATIVE mg/dL
Specific Gravity, Urine: 1.025 (ref 1.005–1.030)
pH: 6 (ref 5.0–8.0)

## 2022-08-02 LAB — URINALYSIS, MICROSCOPIC (REFLEX)

## 2022-08-02 LAB — LACOSAMIDE: Lacosamide: 19.7 ug/mL — ABNORMAL HIGH (ref 5.0–10.0)

## 2022-08-02 MED ORDER — LACTATED RINGERS IV SOLN: INTRAVENOUS | Status: AC

## 2022-08-02 MED ORDER — METOPROLOL TARTRATE 25 MG PO TABS
25.0000 mg | ORAL_TABLET | Freq: Two times a day (BID) | ORAL | Status: DC
  Administered 2022-08-02 – 2022-08-07 (×11): 25 mg via ORAL
  Filled 2022-08-02 (×11): qty 1

## 2022-08-02 MED ORDER — SENNOSIDES-DOCUSATE SODIUM 8.6-50 MG PO TABS
1.0000 | ORAL_TABLET | Freq: Every day | ORAL | Status: DC
  Administered 2022-08-02 – 2022-08-03 (×2): 1 via ORAL
  Filled 2022-08-02 (×2): qty 1

## 2022-08-02 MED ORDER — BACLOFEN 5 MG HALF TABLET
5.0000 mg | ORAL_TABLET | Freq: Three times a day (TID) | ORAL | Status: DC
  Administered 2022-08-02 – 2022-08-07 (×14): 5 mg via ORAL
  Filled 2022-08-02 (×18): qty 1

## 2022-08-02 MED ORDER — BACLOFEN 5 MG HALF TABLET: 5.0000 mg | ORAL_TABLET | Freq: Three times a day (TID) | ORAL | Status: DC

## 2022-08-02 NOTE — Progress Notes (Signed)
Occupational Therapy Treatment Patient Details Name: Gerald [name] MRN: [id number] DOB: 1961-03-17 Today's Date: 08/02/2022   History of present illness Pt is a 62 y/o M admitted on 07/27/22 after presenting with c/o ~week of AMS. Pt found to have RLE critical ischemia with gangrene, RLE cellulitis, & acute encephalopathy/delirium. 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 Pillows placed under RUE for supportive positioning at end of session. Pt presenting with impairments listed below, will follow acutely, recommend SNF at d/c.   Recommendations for follow up therapy are one component of a multi-disciplinary discharge planning process, led by the attending physician.  Recommendations may be updated based on patient status, additional functional criteria and insurance authorization.    Follow Up Recommendations  Skilled nursing-short term rehab (<3 hours/day)     Assistance Recommended at Discharge Frequent or constant Supervision/Assistance  Patient can return home with the following  Two people to help with walking and/or transfers;Two people to help with bathing/dressing/bathroom;Assistance with cooking/housework;Assistance with feeding;Direct supervision/assist for medications management   Equipment Recommendations  Other (comment) (defer)    Recommendations for Other Services PT consult    Precautions / Restrictions Precautions Precautions: Fall Precaution Comments: s/p R AKA 07/30/22, old R hemi Restrictions Weight Bearing Restrictions: Yes RLE Weight Bearing: Non weight  bearing       Mobility Bed Mobility Overal bed mobility: Needs Assistance Bed Mobility: Supine to Sit     Supine to sit: Max assist, +2 for physical assistance, +2 for safety/equipment, HOB elevated          Transfers Overall transfer level: Needs assistance Equipment used: 2 person hand held assist Transfers: Bed to chair/wheelchair/BSC            Lateral/Scoot Transfers: Total assist, +2 physical assistance, +2 safety/equipment, From elevated surface       Balance Overall balance assessment: Needs assistance Sitting-balance support: Feet supported, Single extremity supported Sitting balance-Leahy Scale: Poor Sitting balance - Comments: Pt briefly able to obtain & maintain midline orientation but once experienced LOB pt required ongoing cuing/assist to attempt to regain midline orientation 2/2 R lateral lean. Postural control: Right lateral lean                                 ADL either performed or assessed with clinical judgement   ADL Overall ADL's : Needs assistance/impaired     Grooming: Wash/dry face;Sitting;Set up Grooming Details (indicate cue type and reason): using LUE                 Toilet Transfer: Total assistance;+2 for physical assistance;BSC/3in1 Toilet Transfer Details (indicate cue type and reason): lateral scoot to chair         Functional mobility during ADLs: Total assistance;+2 for physical assistance      Extremity/Trunk Assessment Upper Extremity Assessment Upper Extremity Assessment: RUE deficits/detail RUE Deficits / Details: minimal shoulder flexion and unable to fully extend R elbow, grasp weak but can flex/ext hand to grasp therapist's fingers. reports  numbness/tingling/pain RUE Sensation: decreased light touch;decreased proprioception RUE Coordination: decreased fine motor;decreased gross motor   Lower Extremity Assessment Lower Extremity Assessment: Defer to PT evaluation        Vision   Vision  Assessment?: No apparent visual deficits   Perception Perception Perception: Not tested   Praxis Praxis Praxis: Not tested    Cognition Arousal/Alertness: Awake/alert Behavior During Therapy: WFL for tasks assessed/performed Overall Cognitive Status: Within Functional Limits for tasks assessed                                 General Comments: Follows simple commands with extra time during session. Expressive deficits 2/2 hx of CVA.        Exercises      Shoulder Instructions       General Comments VSS on RA    Pertinent Vitals/ Pain       Pain Assessment Pain Assessment: Faces Pain Score: 7  Faces Pain Scale: Hurts whole lot Pain Location: RUE & RLE Pain Descriptors / Indicators: Grimacing, Guarding Pain Intervention(s): Limited activity within patient's tolerance, Monitored during session, Repositioned  Home Living                                          Prior Functioning/Environment              Frequency  Min 2X/week        Progress Toward Goals  OT Goals(current goals can now be found in the care plan section)  Progress towards OT goals: Progressing toward goals  Acute Rehab OT Goals Patient Stated Goal: to get stronger OT Goal Formulation: With patient Time For Goal Achievement: 08/14/22 Potential to Achieve Goals: Good ADL Goals Pt Will Perform Eating: with min assist;sitting Pt Will Perform Grooming: with supervision;sitting Pt Will Perform Upper Body Bathing: with mod assist;bed level;sitting Pt Will Perform Lower Body Bathing: with mod assist;sitting/lateral leans;bed level;with adaptive equipment Pt Will Perform Upper Body Dressing: with min assist;sitting Pt Will Perform Lower Body Dressing: with mod assist;sitting/lateral leans;with adaptive equipment Pt Will Transfer to Toilet: with mod assist;squat pivot transfer;with transfer board;bedside commode  Plan Discharge plan needs to be updated     Co-evaluation    PT/OT/SLP Co-Evaluation/Treatment: Yes Reason for Co-Treatment: For patient/therapist safety;To address functional/ADL transfers PT goals addressed during session: Mobility/safety with mobility;Balance OT goals addressed during session: ADL's and self-care      AM-PAC OT "6 Clicks" Daily Activity     Outcome Measure   Help from another person eating meals?: A Lot Help from another person taking care of personal grooming?: A Lot Help from another person toileting, which includes using toliet, bedpan, or urinal?: Total Help from another person bathing (including washing, rinsing, drying)?: Total Help from another person to put on and taking off regular upper body clothing?: A Lot Help from another person to put on and taking off regular lower body clothing?: Total 6 Click Score: 9    End of Session    OT Visit Diagnosis: History of falling (Z91.81);Unsteadiness on feet (R26.81);Other abnormalities of gait and mobility (R26.89);Hemiplegia and hemiparesis   Activity Tolerance Patient tolerated treatment well   Patient Left with call bell/phone within reach;in chair;with family/visitor present   Nurse Communication Mobility status        Time: 0936-1000 OT Time Calculation (min):  24 min  Charges: OT General Charges $OT Visit: 1 Visit OT Treatments $Self Care/Home Management : 8-22 mins  Renaye Rakers, OTD, OTR/L SecureChat Preferred Acute Rehab [phone]   Ulla Gallo 08/02/2022, 12:46 PM

## 2022-08-02 NOTE — Progress Notes (Signed)
HD#6 Subjective:   Summary: Gerald [name] is a 62 y.o. 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  Overnight Events: None  Patient is stable this morning and alert and oriented to person and place but needed to be prompted for situation.  He does have some pain in his right AKA but this is mostly associated with spasms that he is having secondary to his prior stroke.  Objective:  Vital signs in last 24 hours: Vitals:   08/02/22 0357 08/02/22 0740 08/02/22 1008 08/02/22 1153  BP: 100/74 125/81 (!) 168/95 122/86  Pulse: 97 (!) 106 (!) 130 (!) 120  Resp: 16 18 18 17  $ Temp: 97.8 F (36.6 C) 98.2 F (36.8 C) 98.3 F (36.8 C) 98.3 F (36.8 C)  TempSrc: Oral Oral Oral Oral  SpO2: 92% 93% 93% 93%  Height:       Supplemental O2:  Room air SpO2: 93 % O2 Flow Rate (L/min): 2 L/min   Physical Exam:  Constitutional: Chronically ill-appearing male appears older than stated age, in no acute distress Cardiac: Tachycardic but regular, palpable left radial pulse, palpable right radial pulse but more difficult in contracted arm Pulmonary/Chest: Decreased breath sounds in the bases bilaterally with clear lung sounds superiorly, normal work of breathing on room air MSK: S/p right AKA with Ace wrap on stump up to groin, no proximal erythema or edema.  Left lower extremity is warm but there is evidence of decreased blood flow including poor nail health but no active ulcers noted. Heel pad dressing in place. Neuro: Alert and oriented to person, place, and situation with prompting.  Chronic right-sided weakness present on exam with very  limited active movement of his right upper extremity and intermittent spasms of hip flexion noted right lower extremity.  BP 122/86 (BP Location: Left Arm)   Pulse (!) 120   Temp 98.3 F (36.8 C) (Oral)   Resp 17   Ht 6' (1.829 m)   SpO2 93%   BMI 32.82 kg/m     Intake/Output Summary (Last 24 hours) at 08/02/2022 1424 Last data filed at 08/02/2022 0913 Gross per 24 hour  Intake 0 ml  Output 600 ml  Net -600 ml   Net IO Since Admission: 233.5 mL [08/02/22 1424]  Pertinent Labs:    Latest Ref Rng & Units 08/02/2022    1:19 AM 08/01/2022    1:18 AM 07/31/2022    1:30 AM  CBC  WBC 4.0 - 10.5 K/uL 14.3  16.9  12.4   Hemoglobin 13.0 - 17.0 g/dL 10.5  11.7  10.9   Hematocrit 39.0 - 52.0 % 31.8  35.3  34.2   Platelets 150 - 400 K/uL 237  253  218        Latest Ref Rng & Units 08/01/2022    1:18 AM 07/31/2022    1:30 AM 07/30/2022    2:05 AM  CMP  Glucose 70 - 99 mg/dL 112  91  114   BUN 6 - 20 mg/dL 9  8  7   $ Creatinine 0.61 - 1.24 mg/dL 0.79  0.72  0.69   Sodium 135 - 145 mmol/L 137  139  138  Potassium 3.5 - 5.1 mmol/L 3.9  4.3  3.8   Chloride 98 - 111 mmol/L 106  110  106   CO2 22 - 32 mmol/L 24  22  23   $ Calcium 8.9 - 10.3 mg/dL 9.0  8.8  9.0     Assessment/Plan:   Principal Problem:   Critical limb ischemia of right lower extremity with gangrene (HCC) Active Problems:   Essential hypertension   Spastic hemiplegia affecting dominant side (HCC)   Seizure disorder (HCC)   History of stroke   PAD (peripheral artery disease) (HCC)   Acute delirium   Normocytic anemia   Patient Summary: Gerald [name] is a 62 y.o. male with pertinent PMH of prior left ACA infarct with residual right-sided spastic hemiparesis, HTN, seizure disorder, peripheral arterial disease critical limb ischemia of his right lower extremity who presents with about a week of consistent and progressive altered mental status and is admitted for critical limb ischemia of the right lower extremity  with gangrene, on hospital day 6.  S/p right AKA 07/30/2022 Right lower extremity critical ischemia with gangrene Right lower extremity cellulitis Underwent right AKA 2/16.  Did have some delirium after surgery which resolved after couple days.  Leukocytosis improving from 16-14 and he is persistently tachycardic but we have not been giving his scheduled metoprolol.. Vancomycin and cefepime 2/13 - 2/16.  He appears well-hydrated on exam. Right AKA dressing appears clean and will be changed tomorrow.  He is having more spasms in his right lower extremity that are causing pain.  Overall he is improved and we are still monitoring closely for signs of infection.  - Monitor for any fever, could be masked by Tylenol - Tylenol 1 g 3 times daily with temperature checks prior - Oxycodone 5 mg every 6 hours as needed - PT/OT recommend SNF, referral process started - Start baclofen 5 mg 3 times daily, may increase to control spasms after 3 days - Scheduled metoprolol 25 mg twice daily - Incentive spirometer for atelectasis  Delirium 2/2 infection, (increased risk due to prior CVA, and hx of seizures) Mental status stable today and he is now alert and oriented to person, place, and situation with prompting.  He is at still at risk for delirium exacerbation after surgery, having pain, and just being in the hospital. He is also at risk for an infection. We will monitor closely as above and treat his pain.   Microscopic Hematuria Exophytic Mass on Right Kidney On admission UA there was 6-10 RBCs on UA and CT of the abd/pel w/ contrast showed indeterminate 3 cm exophytic interpolar right renal lesion.  There are no prior studies to compare this to.  Radiology concerned about hemorrhagic cyst vs solid neoplasm and recommended [place] for further evaluation. Today his urine reservoir has very dark urine with possible sediment. He also has a normocytic anemia that is new this admission and slightly worsened after R AKA.  We will get a UA and a CT urogram for further characterization. - UA and CT urogram   Pre-renal AKI, resolved Patient presented with creatinine of 1.30 up from baseline of below 1. Responded well to IV fluids.   Hypokalemia, resolved Potassium 3.3 on admission and stable after supplementation.   Peripheral arterial disease Patient has critical limb ischemia of his right lower extremity as above but also has diminished/absent pulses in his left lower extremity.  We will continue to work with vascular to see if he needs any further studies of his  left lower extremity or remaining vasculature after addressing his acute presentation. - continue to hold aspirin, may resume after assuring that he is not actively losing blood through his urinary tract   Prior left ACA CVA with residual right-sided spastic hemiplegia Seizure disorder Patient did present altered as above but does not appear to have any new focal neurologic deficits and had no evidence of intracranial hemorrhage on CT scan.  He also has been taking his antiepileptics consistently at home and has not had a seizure in at least a year. - Continue home Depakote 1500 mg daily, Vimpat 50 mg twice daily, and Keppra 1500 mg twice daily - Start baclofen 5 mg 3 times daily, may increase to effect after 3 days  Normocytic anemia Patient has developed anemia after presentation with hemoglobin of 14 down to 11-12.  Microscopic hematuria and workup as above.  Iron studies did not show any iron deficiency.  Last colonoscopy in 2020 showed 1 small tubular adenoma.   Hypertension Blood pressure has been elevated since admission with systolics up to 0000000 today but predominantly systolics between [id number].  He is on amlodipine/olmesartan 10/40 mg daily at home.  May add olmesartan as needed but will continue to hold for now. - Continue amlodipine 10 mg daily and metoprolol 25 mg twice daily  Diet: Heart Healthy IVF: None, VTE: Enoxaparin holding while  we workup microscopic hematuria and anemia with renal mass Code: Full Family Update: Family updated in room.   Dispo: Pending further workup of renal lesion and SNF placement.   Johny Blamer, DO Internal Medicine Resident PGY-1 Pager: [phone]  Please contact the on call pager after 5 pm and on weekends at [phone].

## 2022-08-02 NOTE — Plan of Care (Signed)
  Problem: Nutrition: Goal: Adequate nutrition will be maintained Outcome: Progressing   Problem: Activity: Goal: Risk for activity intolerance will decrease Outcome: Not Progressing

## 2022-08-02 NOTE — Care Management Important Message (Signed)
Important Message  Patient Details  Name: REDFORD SALSER MRN: [id number] Date of Birth: Mar 12, 1961   Medicare Important Message Given:  Yes     Shelda Altes 08/02/2022, 8:33 AM

## 2022-08-02 NOTE — Progress Notes (Addendum)
[facility]  Subjective  - Doing well dressing changes better   Objective 100/74 97 97.8 F (36.6 C) (Oral) 16 92%  Intake/Output Summary (Last 24 hours) at 08/02/2022 [id number] Last data filed at 08/02/2022 [id number] Gross per 24 hour  Intake 0 ml  Output --  Net 0 ml    Right AKA healing well, stump is viable without open wounds or signs of ischemia Lungs non labored breathing   Assessment/Planning: 62 y.o. male with critical limb ischemia of right lower extremity in non functional RLE from prior CVA   POD # 3   Pending heel protector to left heel and retention sock to right AKA Stable disposition with viable right AKA F/U has been shceuled   Roxy Horseman 08/02/2022 7:18 AM --  Laboratory Lab Results: Recent Labs    08/01/22 0118 08/02/22 0119  WBC 16.9* 14.3*  HGB 11.7* 10.5*  HCT 35.3* 31.8*  PLT 253 237   BMET Recent Labs    07/31/22 0130 08/01/22 0118  NA 139 137  K 4.3 3.9  CL 110 106  CO2 22 24  GLUCOSE 91 112*  BUN 8 9  CREATININE 0.72 0.79  CALCIUM 8.8* 9.0    COAG Lab Results  Component Value Date   INR 1.2 07/27/2022   INR 1.7 (H) 05/27/2022   INR 1.20 05/22/2016   No results found for: "PTT"  I have independently interviewed and examined patient and agree with PA assessment and plan above. Retention sock to be placed. [place] for dc from amputation standpoint.   Nathan Moctezuma C. Donzetta Matters, MD [facility] [place] Office: [phone] Pager: [phone]

## 2022-08-02 NOTE — Progress Notes (Signed)
Orthopedic Tech Progress Note Patient Details:  MANU LANDAVAZO Sep 15, 1960 [id number]  Patient ID: [name], male   DOB: 09-Apr-1961, 62 y.o.   MRN: [id number] I called order into [facility]. Karolee Stamps 08/02/2022, 6:45 AM

## 2022-08-02 NOTE — Progress Notes (Signed)
Physical Therapy Treatment Patient Details Name: Gerald [name] MRN: [id number] DOB: 03/23/1961 Today's Date: 08/02/2022   History of Present Illness Pt is a 62 y/o M admitted on 07/27/22 after presenting with c/o ~week of AMS. Pt found to have RLE critical ischemia with gangrene, RLE cellulitis, & acute encephalopathy/delirium. 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  Pt received in bed with wife [name]) present for session. Pt agreeable to participate but does endorse RUE & RLE pain - meds requested. PT educated pt & wife on desensitization techniques for RLE. Pt requires max assist +2 for bed mobility & total assist +2 for lateral scoot to drop arm recliner. Pt presents with R lateral lean in sitting & transfer & decreased ability to correct balance nor position trunk during transfers. Recommend STR upon d/c.   Recommendations for follow up therapy are one component of a multi-disciplinary discharge planning process, led by the attending physician.  Recommendations may be updated based on patient status, additional functional criteria and insurance authorization.  Follow Up Recommendations  Skilled nursing-short term rehab (<3 hours/day) Can patient physically be transported by private vehicle: No   Assistance Recommended at Discharge Frequent or constant Supervision/Assistance  Patient can return home with the following Two people to help with walking and/or transfers;Two people to help with bathing/dressing/bathroom;Help with stairs or ramp for entrance;Direct supervision/assist for medications management;Assist for transportation;Assistance with cooking/housework;Direct supervision/assist for financial management   Equipment Recommendations  None recommended by PT (TBD  in next venue)    Recommendations for Other Services       Precautions / Restrictions Precautions Precautions: Fall Precaution Comments: s/p R AKA 07/30/22, old R hemi Restrictions Weight Bearing Restrictions: Yes RLE Weight Bearing: Non weight bearing     Mobility  Bed Mobility Overal bed mobility: Needs Assistance Bed Mobility: Supine to Sit     Supine to sit: Max assist, +2 for physical assistance, +2 for safety/equipment, HOB elevated     General bed mobility comments: Pt requires cuing to initiate moving LLE towards EOB but ultimately requires max assist +2 for supine>sit with HOB elevated.    Transfers Overall transfer level: Needs assistance Equipment used: None Transfers: Bed to chair/wheelchair/BSC            Lateral/Scoot Transfers: Total assist, +2 physical assistance, +2 safety/equipment, From elevated surface General transfer comment: PT & OT assisted pt with lateral scoot bed>drop arm recliner (elevated>lower height) with multiple scoots, cuing to pt for anterior weight shift, hand placement, & head/hips relationship.    Ambulation/Gait                   Stairs             Wheelchair Mobility    Modified Rankin (Stroke Patients Only)       Balance Overall balance assessment: Needs assistance Sitting-balance support: Feet supported, Single extremity supported Sitting balance-Leahy Scale: Poor Sitting balance - Comments: Pt briefly able to obtain & maintain midline orientation but once experienced LOB pt required ongoing cuing/assist to attempt to regain midline orientation 2/2 R lateral lean. Postural control: Right lateral lean  Cognition Arousal/Alertness: Awake/alert Behavior During Therapy: WFL for tasks assessed/performed Overall Cognitive Status: Within Functional Limits for tasks assessed                                 General Comments: Follows simple commands  with extra time during session. Expressive deficits 2/2 hx of CVA.        Exercises Other Exercises Other Exercises: PT educated pt & wife on desensitization techniques as well as purpose of LLE prevalon boot.    General Comments        Pertinent Vitals/Pain Pain Assessment Pain Assessment: Faces Faces Pain Scale: Hurts whole lot Pain Location: RUE & RLE Pain Descriptors / Indicators: Grimacing, Guarding Pain Intervention(s): Monitored during session, Repositioned, Patient requesting pain meds-RN notified    Home Living                          Prior Function            PT Goals (current goals can now be found in the care plan section) Acute Rehab PT Goals Patient Stated Goal: none stated PT Goal Formulation: With patient Time For Goal Achievement: 08/13/22 Potential to Achieve Goals: Fair Progress towards PT goals: Progressing toward goals    Frequency    Min 3X/week      PT Plan Discharge plan needs to be updated    Co-evaluation PT/OT/SLP Co-Evaluation/Treatment: Yes Reason for Co-Treatment: For patient/therapist safety;To address functional/ADL transfers PT goals addressed during session: Mobility/safety with mobility;Balance        AM-PAC PT "6 Clicks" Mobility   Outcome Measure  Help needed turning from your back to your side while in a flat bed without using bedrails?: Total Help needed moving from lying on your back to sitting on the side of a flat bed without using bedrails?: Total Help needed moving to and from a bed to a chair (including a wheelchair)?: Total Help needed standing up from a chair using your arms (e.g., wheelchair or bedside chair)?: Total Help needed to walk in hospital room?: Total Help needed climbing 3-5 steps with a railing? : Total 6 Click Score: 6    End of Session   Activity Tolerance: Patient tolerated treatment well;Patient limited by pain Patient left: in chair;with call bell/phone within reach;with  family/visitor present Nurse Communication: Mobility status PT Visit Diagnosis: Muscle weakness (generalized) (M62.81);Other abnormalities of gait and mobility (R26.89);Pain Pain - Right/Left: Right Pain - part of body: Arm;Leg     Time: 0936-1000 PT Time Calculation (min) (ACUTE ONLY): 24 min  Charges:  $Therapeutic Activity: 8-22 mins                     Lavone Nian, PT, DPT 08/02/22, 10:21 AM   Waunita Schooner 08/02/2022, 10:19 AM

## 2022-08-02 NOTE — TOC Progression Note (Signed)
Transition of Care [facility]) - Progression Note    Patient Details  Name: Gerald [name] MRN: [id number] Date of Birth: 03/13/1961  [facility]) CM/SW [place], [place] Phone Number: 08/02/2022, 3:41 PM  Clinical Narrative:     Called patient's spouse - left voice message to return call.     Barriers to Discharge: Continued Medical Work up  Expected Discharge Plan and Services       Living arrangements for the past 2 months: Apartment                                       Social Determinants of Health (SDOH) Interventions SDOH Screenings   Food Insecurity: No Food Insecurity (07/29/2022)  Housing: Low Risk  (07/29/2022)  Transportation Needs: No Transportation Needs (07/29/2022)  Utilities: Not At Risk (07/29/2022)  Depression (PHQ2-9): Low Risk  (07/15/2022)  Social Connections: Moderately Integrated (06/15/2022)  Tobacco Use: High Risk (08/01/2022)    Readmission Risk Interventions     No data to display

## 2022-08-03 DIAGNOSIS — I70261 Atherosclerosis of native arteries of extremities with gangrene, right leg: Secondary | ICD-10-CM | POA: Diagnosis not present

## 2022-08-03 DIAGNOSIS — L03115 Cellulitis of right lower limb: Secondary | ICD-10-CM | POA: Diagnosis not present

## 2022-08-03 LAB — BASIC METABOLIC PANEL
Anion gap: 8 (ref 5–15)
BUN: 10 mg/dL (ref 6–20)
CO2: 27 mmol/L (ref 22–32)
Calcium: 8.9 mg/dL (ref 8.9–10.3)
Chloride: 103 mmol/L (ref 98–111)
Creatinine, Ser: 0.59 mg/dL — ABNORMAL LOW (ref 0.61–1.24)
GFR, Estimated: >60 mL/min (ref >60)
Glucose, Bld: 101 mg/dL — ABNORMAL HIGH (ref 70–99)
Potassium: 4.1 mmol/L (ref 3.5–5.1)
Sodium: 138 mmol/L (ref 135–145)

## 2022-08-03 LAB — CBC
HCT: 29 % — ABNORMAL LOW (ref 39.0–52.0)
Hemoglobin: 9.8 g/dL — ABNORMAL LOW (ref 13.0–17.0)
MCH: 32.7 pg (ref 26.0–34.0)
MCHC: 33.8 g/dL (ref 30.0–36.0)
MCV: 96.7 fL (ref 80.0–100.0)
Platelets: 223 10*3/uL (ref 150–400)
RBC: 3 MIL/uL — ABNORMAL LOW (ref 4.22–5.81)
RDW: 14.5 % (ref 11.5–15.5)
WBC: 12.8 10*3/uL — ABNORMAL HIGH (ref 4.0–10.5)
nRBC: 0 % (ref 0.0–0.2)

## 2022-08-03 MED ORDER — ENOXAPARIN SODIUM 60 MG/0.6ML IJ SOSY
50.0000 mg | PREFILLED_SYRINGE | INTRAMUSCULAR | Status: DC
  Administered 2022-08-03 – 2022-08-06 (×4): 50 mg via SUBCUTANEOUS
  Filled 2022-08-03 (×4): qty 0.6

## 2022-08-03 MED ORDER — ENOXAPARIN SODIUM 40 MG/0.4ML IJ SOSY: 40.0000 mg | PREFILLED_SYRINGE | INTRAMUSCULAR | Status: DC

## 2022-08-03 MED ORDER — ASPIRIN 81 MG PO TBEC
81.0000 mg | DELAYED_RELEASE_TABLET | Freq: Every day | ORAL | Status: DC
  Administered 2022-08-03 – 2022-08-07 (×5): 81 mg via ORAL
  Filled 2022-08-03 (×5): qty 1

## 2022-08-03 MED ORDER — POLYETHYLENE GLYCOL 3350 17 G PO PACK
17.0000 g | PACK | Freq: Every day | ORAL | Status: DC
  Administered 2022-08-04 – 2022-08-06 (×3): 17 g via ORAL
  Filled 2022-08-03 (×3): qty 1

## 2022-08-03 MED ORDER — SENNOSIDES-DOCUSATE SODIUM 8.6-50 MG PO TABS
2.0000 | ORAL_TABLET | Freq: Two times a day (BID) | ORAL | Status: DC
  Administered 2022-08-03 – 2022-08-06 (×6): 2 via ORAL
  Filled 2022-08-03 (×6): qty 2

## 2022-08-03 NOTE — Progress Notes (Signed)
Orthopedic Tech Progress Note Patient Details:  Gerald [name] 09-Jul-1960 [id number] Called in order for AK sock Patient ID: Gerald [name], male   DOB: April 07, 1961, 62 y.o.   MRN: [id number]  [name] 08/03/2022, 10:59 AM

## 2022-08-03 NOTE — Progress Notes (Signed)
[employer] sock ordered this am.   Verified with orthotech approx 1115 and notified outside [place] notified and expected delivery today.  No sock noted thus far

## 2022-08-03 NOTE — Progress Notes (Cosign Needed Addendum)
HD#7 Subjective:   Summary: Gerald [name] is a 62 y.o. 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  Overnight Events: None  Patient is stable today and still having pain in his right residual limb but less spasms than yesterday.  His surgical site is not dressed and his staples are in place without evidence of dehiscence, bleeding, discharge, or surrounding erythema or edema.  He does say that he is having some nausea which is causing him not to eat very much.  Objective:  Vital signs in last 24 hours: Vitals:   08/02/22 1600 08/02/22 2013 08/02/22 2347 08/03/22 0338  BP: (!) 147/89 130/83 (!) 165/89 120/77  Pulse: 98 99 85 90  Resp: 20 18 18 16  $ Temp: 97.6 F (36.4 C) 98 F (36.7 C) 97.7 F (36.5 C) 98 F (36.7 C)  TempSrc: Oral Oral Oral Oral  SpO2: 95% 97% 96% 99%  Height:       Supplemental O2: Nasal Cannula SpO2: 99 % O2 Flow Rate (L/min): 2 L/min   Physical Exam:  Constitutional: Chronically ill-appearing male appears older than stated age, in no acute distress Cardiac: Tachycardic but regular, palpable left radial pulse, palpable right radial pulse but more difficult in contracted arm Pulmonary/Chest: Decreased breath sounds in the bases bilaterally with clear lung sounds superiorly MSK: S/p right AKA with staples in place, clean and dry without signs of infection.  Left lower extremity is warm with Prevnar boot in place. Abd:  softly distended, seemingly tender in RLQ and R inguinal area but not over bladder.   Neuro: Alert and oriented to person, place, and situation with prompting.  Chronic right-sided  weakness present on exam with very limited active movement of his right upper extremity and intermittent spasms of hip flexion noted right lower extremity. Catheter draining dark urine  BP 120/77 (BP Location: Right Arm)   Pulse 90   Temp 98 F (36.7 C) (Oral)   Resp 16   Ht 6' (1.829 m)   SpO2 99%   BMI 32.82 kg/m     Intake/Output Summary (Last 24 hours) at 08/03/2022 [id number] Last data filed at 08/03/2022 [id number] Gross per 24 hour  Intake --  Output 1000 ml  Net -1000 ml   Net IO Since Admission: -166.5 mL [08/03/22 0633]  Pertinent Labs:    Latest Ref Rng & Units 08/03/2022    1:52 AM 08/02/2022    1:19 AM 08/01/2022    1:18 AM  CBC  WBC 4.0 - 10.5 K/uL 12.8  14.3  16.9   Hemoglobin 13.0 - 17.0 g/dL 9.8  10.5  11.7   Hematocrit 39.0 - 52.0 % 29.0  31.8  35.3   Platelets 150 - 400 K/uL 223  237  253        Latest Ref Rng & Units 08/01/2022    1:18 AM 07/31/2022    1:30 AM 07/30/2022    2:05 AM  CMP  Glucose 70 - 99 mg/dL 112  91  114   BUN 6 - 20 mg/dL 9  8  7   $ Creatinine 0.61 - 1.24 mg/dL 0.79  0.72  0.69   Sodium 135 - 145  mmol/L 137  139  138   Potassium 3.5 - 5.1 mmol/L 3.9  4.3  3.8   Chloride 98 - 111 mmol/L 106  110  106   CO2 22 - 32 mmol/L 24  22  23   $ Calcium 8.9 - 10.3 mg/dL 9.0  8.8  9.0     Assessment/Plan:   Principal Problem:   Above knee amputation of right lower extremity (HCC) Active Problems:   Critical limb ischemia of right lower extremity with gangrene ([place]), resolved   Essential hypertension   Spastic hemiplegia affecting dominant side (HCC)   Seizure disorder (HCC)   History of stroke   PAD (peripheral artery disease) (HCC)    Normocytic anemia   Patient Summary: Gerald [name] is a 62 y.o. 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  S/p right AKA 07/30/2022 Right lower extremity critical ischemia with gangrene, resolved with surgery Right lower extremity cellulitis, resolved with surgery Underwent right AKA 2/16. Did have some delirium after surgery which resolved after couple days. Vancomycin and cefepime 2/13 - 2/16.  No dressing on wound today and wound appears clean and dry.  He is having some nausea so we will make sure that he is getting his as needed medication especially before meals.  He also has not gotten his incentive spirometer so this was discussed with nursing and they will work to make sure he gets it today.  He did have a small soft bowel movement today. - Tylenol 1 g 3 times daily with temperature checks prior - Oxycodone 5 mg every 6 hours as needed - Senokot twice daily and MiraLAX - PT/OT recommend SNF, referral process started - baclofen 5 mg 3 times daily, may increase to control spasms after 3 days - Scheduled metoprolol 25 mg twice daily - Incentive spirometer for atelectasis, intermittently needs supplemental O2, will continue to monitor and assess for chest x-ray if this becomes continuous  Delirium 2/2 infection, resolved (increased risk due to prior CVA, and hx of seizures) Mental status stable today and he is now alert and oriented to person, place, and situation with prompting.  He is at still at risk for delirium exacerbation after surgery, having pain, and just being in the hospital. He is also at risk for an infection. We will monitor closely as above and treat his pain.   Gross hematuria Simple cyst right kidney On admission UA there was 6-10 RBCs on UA and CT of the abd/pel w/ contrast showed indeterminate 3 cm exophytic interpolar right renal lesion.  Urinalysis showed calcium oxalate crystals, 11-20 RBCs, and small amount of bilirubin.  Subsequent  urine was noted to be grossly bloody.  To further evaluate, renal ultrasound demonstrated this mass to be a simple cyst (3 phase abd/pelvic CT cannot be done inpatient).  He did not have any stones seen on CT of the abdomen pelvis with contrast on admission.  He is having some nausea as above but no flank pain, no dysuria, and no groin pain aside from the pain he gets in his right leg with spasms.  If he does develop flank pain, persistent groin pain, or pain with urination we will likely get a CT  of the abdomen pelvis without contrast to evaluate for stones.  In the meantime we will continue with good hydration and treat his nausea so that he has adequate p.o. intake.  Of note he does have a significant smoking history which puts him at high risk for urinary tract cancer .   Pre-renal AKI, resolved Patient presented with creatinine of 1.30 up from baseline of below 1. Responded well to IV fluids.   Hypokalemia, resolved Potassium 3.3 on admission and stable after supplementation.   Peripheral arterial disease Patient had critical limb ischemia of his right lower extremity as above but also has diminished/absent pulses in his left lower extremity. High risk for pressure injury.  Heel protection initiated. - Resume aspirin   Prior left ACA CVA with residual right-sided spastic hemiplegia Seizure disorder Patient did present altered as above but does not appear to have any new focal neurologic deficits and had no evidence of intracranial hemorrhage on CT scan.  He also has been taking his antiepileptics consistently at home and has not had a seizure in at least a year. - Continue home Depakote 1500 mg daily, Vimpat 50 mg twice daily, and Keppra 1500 mg twice daily - Start baclofen 5 mg 3 times daily, may increase to effect after 3 days  Normocytic anemia Patient has developed anemia after presentation with hemoglobin of 14 down to 11-12, largely suspected to be within range of operative blood loss.   Microscopic hematuria and workup as above.  Iron studies did not show any iron deficiency.  Last colonoscopy in 2020 showed 1 small tubular adenoma.   Hypertension Blood pressure has been elevated since admission with systolics up to 0000000 today but predominantly systolics between [id number].  He is on amlodipine/olmesartan 10/40 mg daily at home.  May add olmesartan as needed but will continue to hold for now. - Continue amlodipine 10 mg daily and metoprolol 25 mg twice daily  Diet: Heart Healthy IVF: None, VTE: Enoxaparin  Code: Full Family Update: Family updated in room.   Dispo: Pending adequate p.o. intake and SNF placement.   Johny Blamer, DO Internal Medicine Resident PGY-1 Pager: [phone]  Please contact the on call pager after 5 pm and on weekends at [phone].

## 2022-08-03 NOTE — TOC Initial Note (Signed)
Transition of Care [facility]) - Initial/Assessment Note    Patient Details  Name: Gerald [name] MRN: [id number] Date of Birth: [birth year]  [facility]) CM/SW Contact:    Milas Gain, [place] Phone Number: 08/03/2022, 4:59 PM  Clinical Narrative:                   CSW received consult for possible SNF placement at time of discharge. Due to patients current orientation CSW called patients spouse [name] and LVM. CSW awaiting call back to discuss PT/OT recommendations.CSW will continue to follow and assist with patients dc planning needs.   Expected Discharge Plan: Skilled Nursing Facility Barriers to Discharge: Continued Medical Work up   Patient Goals and CMS Choice            Expected Discharge Plan and Services       Living arrangements for the past 2 months: Apartment                                      Prior Living Arrangements/Services Living arrangements for the past 2 months: Apartment Lives with:: Spouse                   Activities of Daily Living Home Assistive Devices/Equipment: Wheelchair ADL Screening (condition at time of admission) Patient's cognitive ability adequate to safely complete daily activities?: No Is the patient deaf or have difficulty hearing?: No Does the patient have difficulty seeing, even when wearing glasses/contacts?: No Does the patient have difficulty concentrating, remembering, or making decisions?: Yes Patient able to express need for assistance with ADLs?: Yes Does the patient have difficulty dressing or bathing?: Yes Independently performs ADLs?: No Does the patient have difficulty walking or climbing stairs?: Yes Weakness of Legs: Right Weakness of Arms/Hands: Right  Permission Sought/Granted                  Emotional Assessment              Admission diagnosis:  Critical limb ischemia of right lower extremity with gangrene ([place]) [I70.261] Cellulitis of foot [L03.119] Patient Active  Problem List   Diagnosis Date Noted   Normocytic anemia 07/29/2022   Acute delirium 07/28/2022   Critical limb ischemia of right lower extremity with gangrene ([place]) 07/27/2022   Sepsis ([place]) 07/27/2022   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   PAD (peripheral artery disease) ([place]) 07/21/2022   ASCVD (arteriosclerotic cardiovascular disease) 06/15/2022   Rhinosinusitis 06/15/2022   COVID-19 05/28/2022   History of stroke 06/09/2017   Seizure disorder ([place]) 05/22/2016   Anxiety state 04/20/2016   Spastic hemiplegia affecting dominant side ([place]) 09/11/2015   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    Essential hypertension 01/15/2013   Nicotine dependence 01/15/2013   Alcohol use disorder 01/15/2013   PCP:  Linward Natal, MD Pharmacy:   [place], [name] - [address] [name] [address] Phone: [phone] Fax: [phone]     Social Determinants of Health (SDOH) Social History: SDOH Screenings   Food Insecurity: No Food Insecurity (07/29/2022)  Housing: Low Risk  (07/29/2022)  Transportation Needs: No Transportation Needs (07/29/2022)  Utilities: Not At Risk (07/29/2022)  Depression (PHQ2-9): Low Risk  (07/15/2022)  Social Connections: Moderately Integrated (06/15/2022)  Tobacco Use: High Risk (08/01/2022)   SDOH Interventions:     Readmission Risk Interventions     No data  to display

## 2022-08-03 NOTE — Progress Notes (Addendum)
[facility] [place]  Subjective  - No new complaints   Objective 120/77 90 98 F (36.7 C) (Oral) 16 99%  Intake/Output Summary (Last 24 hours) at 08/03/2022 0656 Last data filed at 08/03/2022 [id number] Gross per 24 hour  Intake --  Output 1000 ml  Net -1000 ml    Right AKA Healing well and appears viable Heel protector in place left LE Lungs non labored breathing  Assessment/Planning: 62 y.o. male with critical limb ischemia of right lower extremity in non functional RLE from prior CVA   POD # 4  Pending [employer] retention sock and D/C ace wrap today AKA stump viable  Gerald [name] 08/03/2022 6:56 AM --  Laboratory Lab Results: Recent Labs    08/02/22 0119 08/03/22 0152  WBC 14.3* 12.8*  HGB 10.5* 9.8*  HCT 31.8* 29.0*  PLT 237 223   BMET Recent Labs    08/01/22 0118  NA 137  K 3.9  CL 106  CO2 24  GLUCOSE 112*  BUN 9  CREATININE 0.79  CALCIUM 9.0    COAG Lab Results  Component Value Date   INR 1.2 07/27/2022   INR 1.7 (H) 05/27/2022   INR 1.20 05/22/2016   No results found for: "PTT"   I have independently interviewed and examined patient and agree with PA assessment and plan above.   Shavette Shoaff C. Donzetta Matters, MD [facility] [place] Office: [phone] Pager: [phone]

## 2022-08-04 ENCOUNTER — Inpatient Hospital Stay (HOSPITAL_COMMUNITY): Payer: PPO

## 2022-08-04 DIAGNOSIS — L03115 Cellulitis of right lower limb: Secondary | ICD-10-CM | POA: Diagnosis not present

## 2022-08-04 DIAGNOSIS — Z89611 Acquired absence of right leg above knee: Secondary | ICD-10-CM | POA: Diagnosis not present

## 2022-08-04 DIAGNOSIS — I70261 Atherosclerosis of native arteries of extremities with gangrene, right leg: Secondary | ICD-10-CM | POA: Diagnosis not present

## 2022-08-04 DIAGNOSIS — F17210 Nicotine dependence, cigarettes, uncomplicated: Secondary | ICD-10-CM | POA: Diagnosis not present

## 2022-08-04 DIAGNOSIS — R310 Gross hematuria: Secondary | ICD-10-CM | POA: Diagnosis not present

## 2022-08-04 DIAGNOSIS — S78111A Complete traumatic amputation at level between right hip and knee, initial encounter: Secondary | ICD-10-CM

## 2022-08-04 LAB — CBC
HCT: 29.4 % — ABNORMAL LOW (ref 39.0–52.0)
Hemoglobin: 9.7 g/dL — ABNORMAL LOW (ref 13.0–17.0)
MCH: 32.4 pg (ref 26.0–34.0)
MCHC: 33 g/dL (ref 30.0–36.0)
MCV: 98.3 fL (ref 80.0–100.0)
Platelets: 244 10*3/uL (ref 150–400)
RBC: 2.99 MIL/uL — ABNORMAL LOW (ref 4.22–5.81)
RDW: 14.4 % (ref 11.5–15.5)
WBC: 10.6 10*3/uL — ABNORMAL HIGH (ref 4.0–10.5)
nRBC: 0 % (ref 0.0–0.2)

## 2022-08-04 MED ORDER — OXYCODONE HCL 5 MG PO TABS
5.0000 mg | ORAL_TABLET | ORAL | Status: AC
  Administered 2022-08-04 (×3): 5 mg via ORAL
  Filled 2022-08-04 (×3): qty 1

## 2022-08-04 MED ORDER — OXYCODONE HCL 5 MG PO TABS: 5.0000 mg | ORAL_TABLET | ORAL | Status: DC

## 2022-08-04 MED ORDER — ADULT MULTIVITAMIN W/MINERALS CH
1.0000 | ORAL_TABLET | Freq: Every day | ORAL | Status: DC
  Administered 2022-08-04 – 2022-08-07 (×4): 1 via ORAL
  Filled 2022-08-04 (×4): qty 1

## 2022-08-04 MED ORDER — LACTATED RINGERS IV SOLN: INTRAVENOUS | Status: AC

## 2022-08-04 MED ORDER — ENSURE ENLIVE PO LIQD
237.0000 mL | Freq: Two times a day (BID) | ORAL | Status: DC
  Administered 2022-08-04 – 2022-08-06 (×4): 237 mL via ORAL

## 2022-08-04 MED ORDER — OXYCODONE HCL 5 MG PO TABS
5.0000 mg | ORAL_TABLET | ORAL | Status: DC
  Administered 2022-08-04 – 2022-08-05 (×5): 5 mg via ORAL
  Filled 2022-08-04 (×5): qty 1

## 2022-08-04 NOTE — TOC Initial Note (Signed)
Transition of Care [facility]) - Initial/Assessment Note    Patient Details  Name: Gerald [name] MRN: [id number] Date of Birth: 01/26/1961  [facility]) CM/SW Contact:    Vinie Sill, LCSW Phone Number: 08/04/2022, 12:46 PM  Clinical Narrative:                  Patient is not fully alert. CSW spoke with the patient's spouse by phone. CSW introduced self and explained role. CSW discussed recommendations of short term rehab at [facility]. She states she is aware of recommendations and is agrees with SNF.  CSW explained the SNF process. At this time family does not want [facility]. Patient's spouse requested CSW contact patient's sister([name]) with SNF offers , once received. All questions answered.   TOC will provide bed offers once available  TOC will continue to follow and assist with discharge planning.  Thurmond Butts, MSW, LCSW Clinical Social Worker    Expected Discharge Plan: Skilled Nursing Facility Barriers to Discharge: Continued Medical Work up   Patient Goals and CMS Choice            Expected Discharge Plan and Services In-house Referral: Clinical Social Work     Living arrangements for the past 2 months: Apartment                                      Prior Living Arrangements/Services Living arrangements for the past 2 months: Apartment Lives with:: Self, Spouse Patient language and need for interpreter reviewed:: No Do you feel safe going back to the place where you live?: No      Need for Family Participation in Patient Care: Yes (Comment) Care giver support system in place?: Yes (comment)   Criminal Activity/Legal Involvement Pertinent to Current Situation/Hospitalization: No - Comment as needed  Activities of Daily Living Home Assistive Devices/Equipment: Wheelchair ADL Screening (condition at time of admission) Patient's cognitive ability adequate to safely complete daily activities?: No Is the patient deaf or have  difficulty hearing?: No Does the patient have difficulty seeing, even when wearing glasses/contacts?: No Does the patient have difficulty concentrating, remembering, or making decisions?: Yes Patient able to express need for assistance with ADLs?: Yes Does the patient have difficulty dressing or bathing?: Yes Independently performs ADLs?: No Does the patient have difficulty walking or climbing stairs?: Yes Weakness of Legs: Right Weakness of Arms/Hands: Right  Permission Sought/Granted   Permission granted to share information with : No (patient confused - spoke with spouse)     Permission granted to share info w AGENCY: SNFs        Emotional Assessment       Orientation: : Oriented to Self, Oriented to Place Alcohol / Substance Use: Not Applicable Psych Involvement: No (comment)  Admission diagnosis:  Critical limb ischemia of right lower extremity with gangrene ([place]) [I70.261] Cellulitis of foot [L03.119] Patient Active Problem List   Diagnosis Date Noted   Normocytic anemia 07/29/2022   Acute delirium 07/28/2022   Critical limb ischemia of right lower extremity with gangrene ([place]) 07/27/2022   Sepsis ([place]) 07/27/2022   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 [name] [address] Phone: [phone] Fax: [phone]     Social Determinants of Health (SDOH) Social History: SDOH  Screenings   Food Insecurity: No Food Insecurity (07/29/2022)  Housing: Low Risk  (07/29/2022)  Transportation Needs: No Transportation Needs (07/29/2022)  Utilities: Not At Risk (07/29/2022)  Depression (PHQ2-9): Low Risk  (07/15/2022)  Social Connections: Moderately Integrated (06/15/2022)  Tobacco Use: High Risk (08/01/2022)   SDOH Interventions:     Readmission Risk Interventions     No data to display

## 2022-08-04 NOTE — Progress Notes (Signed)
Physical Therapy Treatment Patient Details Name: Gerald [name] MRN: [id number] DOB: 1961/04/26 Today's Date: 08/04/2022   History of Present Illness Pt is a 62 y/o M admitted on 07/27/22 after presenting with c/o ~week of AMS. Pt found to have RLE critical ischemia with gangrene, RLE cellulitis, & acute encephalopathy/delirium. 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 Emphasis on improved body mechanics for rolling and bed mobility, supine BLE/UE exercises for strengthening and long sitting in bed for UE/trunk strengthening and balance training. Pt unable to perform lateral/posterior scooting without totalA this date, but was able to maintain long sitting with intermittent episodes of min guard support (variable mod/maxA to min guard) ~3 mins at a time. Defer OOB to chair due to lack of +2 assist and pt fatigue/pain after recent bed linen change. Pt sidelying toward his L and PTA wrote on board to continue pt rolling schedule Q2H for pressure relief. Pt continues to benefit from PT services to progress toward functional mobility goals.    Recommendations for follow up therapy are one component of a multi-disciplinary discharge planning process, led by the attending physician.  Recommendations may be updated based on patient status, additional functional criteria and insurance authorization.  Follow Up Recommendations  Skilled nursing-short term rehab (<3 hours/day) Can patient physically be transported by private vehicle: No   Assistance Recommended at Discharge Frequent or constant Supervision/Assistance  Patient can return home with the following Two people to help with walking and/or  transfers;Two people to help with bathing/dressing/bathroom;Help with stairs or ramp for entrance;Direct supervision/assist for medications management;Assist for transportation;Assistance with cooking/housework;Direct supervision/assist for financial management   Equipment Recommendations  None recommended by PT (TBD post-acute)    Recommendations for Other Services       Precautions / Restrictions Precautions Precautions: Fall Precaution Comments: s/p R AKA 07/30/22, old R hemi Restrictions Weight Bearing Restrictions: Yes RLE Weight Bearing: Non weight bearing     Mobility  Bed Mobility Overal bed mobility: Needs Assistance Bed Mobility: Supine to Sit, Rolling, Sit to Supine Rolling: Max assist, +2 for safety/equipment   Supine to sit: Max assist, HOB elevated Sit to supine: Max assist, HOB elevated   General bed mobility comments: Pt requires cuing to initiate moving BLE towards EOB but ultimately requires max assist +2 to roll toward his L side and maxA +1 to roll toward his R side. +2 maxA for transfer from bed chair posture to long sitting, pt with BUE grasping side rails for support (hand over hand assist needed to grasp R rail). Encouraged pt to attempt lateral lean/weight shifting for posterior seated scooting in bed but pt unable to follow cues for this due to trunk weakness/fatigue after long sitting ~3 mins.    Transfers                   General transfer comment: defer, pt totalA for posterior seated scooting in long sit posture in bed and no +2 assist for most of session.       Balance Overall balance assessment: Needs assistance Sitting-balance support: Feet supported, Bilateral upper extremity supported Sitting balance-Leahy Scale: Poor Sitting balance - Comments: Pt briefly able to  obtain & maintain midline orientation in long sitting in bed chair posture with BUE support, some posterior lean but able to sit unsupported ~30 seconds, otherwise needing  mod/maxA trunk support for long sitting ~3 minutes total. Postural control: Right lateral lean, Posterior lean                                  Cognition Arousal/Alertness: Awake/alert Behavior During Therapy: WFL for tasks assessed/performed Overall Cognitive Status: Within Functional Limits for tasks assessed                                 General Comments: Follows ~70% of simple commands with extra time during session. Expressive deficits 2/2 hx of CVA. Pt states his favorite team is [employer] but unable to name players in certain positions, unclear of his cognitive baseline.        Exercises General Exercises - Lower Extremity Short Arc Quad: AROM, Strengthening, Left, 5 reps, Seated (bed chair posture) Heel Slides: AAROM, Supine, Strengthening, Left, 10 reps Hip ABduction/ADduction: AAROM, Left, 5 reps, Supine Amputee Exercises Hip Extension: AAROM, Right, 5 reps, Supine (attempted to cue but decreased carryover of cues, pain limiting) Hip Flexion/Marching: AROM, Right, 5 reps, Supine, AAROM Other Exercises Other Exercises: bed chair posture bed "crunches" pulling to long sit x5 reps with external assist and cues for UE and core activation Other Exercises: long sitting with BUE support, emphasis on midline seated posture 3 mins with varying levels of external support Other Exercises: Rolling to L/R sides x 3 reps with cross-body reaching and cues for LLE engagement for hip extension to push and bicep activation on LUE    General Comments General comments (skin integrity, edema, etc.): VSS on RA; pt had drainage at distal RLE but RN changed dressing and bed linens just prior to session.      Pertinent Vitals/Pain Pain Assessment Pain Assessment: Faces Faces Pain Scale: Hurts little more Pain Location: RLE Pain Descriptors / Indicators: Grimacing, Guarding, Discomfort, Sore Pain Intervention(s): Monitored during session, Limited activity within  patient's tolerance, Repositioned (offered ice for pain/edema, pt defers)     PT Goals (current goals can now be found in the care plan section) Acute Rehab PT Goals Patient Stated Goal: none stated PT Goal Formulation: With patient Time For Goal Achievement: 08/13/22 Progress towards PT goals: Progressing toward goals    Frequency    Min 3X/week      PT Plan Current plan remains appropriate       AM-PAC PT "6 Clicks" Mobility   Outcome Measure  Help needed turning from your back to your side while in a flat bed without using bedrails?: A Lot Help needed moving from lying on your back to sitting on the side of a flat bed without using bedrails?: Total Help needed moving to and from a bed to a chair (including a wheelchair)?: Total Help needed standing up from a chair using your arms (e.g., wheelchair or bedside chair)?: Total Help needed to walk in hospital room?: Total Help needed climbing 3-5 steps with a railing? : Total 6 Click Score: 7    End of Session   Activity Tolerance: Patient tolerated treatment well;Patient limited by pain Patient left: with call bell/phone within reach;with family/visitor present;in bed;with bed alarm set;Other (comment) (semi-sidelying in bed to offload his R side, HOB elevated >30* for improved  pulmonary clearance; RN requests RLE retention sock remain off due to recent increased drainage in this limb.) Nurse Communication: Mobility status PT Visit Diagnosis: Muscle weakness (generalized) (M62.81);Other abnormalities of gait and mobility (R26.89);Pain Pain - Right/Left: Right Pain - part of body: Arm;Leg     Time: [id number] PT Time Calculation (min) (ACUTE ONLY): 19 min  Charges:  $Therapeutic Exercise: 8-22 mins                     Carrie Usery P., PTA Acute Rehabilitation Services Secure Chat Preferred 9a-5:30pm Office: [place] 08/04/2022, 6:17 PM

## 2022-08-04 NOTE — Progress Notes (Signed)
HD#8 Subjective:   Summary: Gerald [name] is a 62 y.o. 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  Overnight Events: None  Patient is stable today and still having pain in his right AKA.  He is also having some pain in his abdomen that is more prominent on the right.  He has not been eating very well and not able to drink very well either due to nausea and lack of use of his right arm.  He is not having any trouble breathing.  Objective:  Vital signs in last 24 hours: Vitals:   08/03/22 2324 08/04/22 0323 08/04/22 0753 08/04/22 1620  BP: 99/63 92/60 120/73 127/77  Pulse: 68 66 76 86  Resp: 18 20 19 18  $ Temp: 97.8 F (36.6 C) 98 F (36.7 C) (!) 97.4 F (36.3 C) 97.9 F (36.6 C)  TempSrc: Oral Oral Oral Oral  SpO2: 100% 100% 96% 97%  Height:       Supplemental O2: Room Air SpO2: 97 % O2 Flow Rate (L/min): 2 L/min   Physical Exam:  Constitutional: Chronically ill-appearing male appears older than stated age, in no acute distress Cardiac: Regular rate and rhythm, palpable left radial pulse, palpable right radial pulse but more difficult in contracted arm Pulmonary/Chest: Decreased breath sounds in the bases bilaterally improved from yesterday with clear lung sounds superiorly Abdomen: Moderate tenderness to palpation diffusely worse on the right, mild distention, positive bowel sounds MSK: S/p right AKA with staples in place, clean and dry without signs of infection.  Left lower extremity is warm with Prevnar boot in place. Neuro: Alert and oriented to person, place, and situation with prompting.  Chronic right-sided  weakness present on exam with very limited active movement of his right upper extremity and intermittent spasms of hip flexion noted right lower extremity.  BP 127/77 (BP Location: Right Arm)   Pulse 86   Temp 97.9 F (36.6 C) (Oral)   Resp 18   Ht 6' (1.829 m)   SpO2 97%   BMI 32.82 kg/m     Intake/Output Summary (Last 24 hours) at 08/04/2022 1623 Last data filed at 08/04/2022 [id number] Gross per 24 hour  Intake --  Output 650 ml  Net -650 ml   Net IO Since Admission: -698.5 mL [08/04/22 1623]  Pertinent Labs:    Latest Ref Rng & Units 08/04/2022    1:43 AM 08/03/2022    1:52 AM 08/02/2022    1:19 AM  CBC  WBC 4.0 - 10.5 K/uL 10.6  12.8  14.3   Hemoglobin 13.0 - 17.0 g/dL 9.7  9.8  10.5   Hematocrit 39.0 - 52.0 % 29.4  29.0  31.8   Platelets 150 - 400 K/uL 244  223  237        Latest Ref Rng & Units 08/03/2022    8:02 AM 08/01/2022    1:18 AM 07/31/2022    1:30 AM  CMP  Glucose 70 - 99 mg/dL 101  112  91   BUN 6 - 20 mg/dL 10  9  8   $ Creatinine 0.61 - 1.24 mg/dL 0.59  0.79  0.72   Sodium 135 - 145 mmol/L  138  137  139   Potassium 3.5 - 5.1 mmol/L 4.1  3.9  4.3   Chloride 98 - 111 mmol/L 103  106  110   CO2 22 - 32 mmol/L 27  24  22   $ Calcium 8.9 - 10.3 mg/dL 8.9  9.0  8.8     Assessment/Plan:   Principal Problem:   Critical limb ischemia of right lower extremity with gangrene (HCC) Active Problems:   Essential hypertension   Spastic hemiplegia affecting dominant side (HCC)   Seizure disorder (HCC)   History of stroke   PAD (peripheral artery disease) (HCC)   Acute delirium   Normocytic anemia   Patient Summary: Gerald [name] is a 62 y.o. male with pertinent PMH of prior left ACA infarct with residual right-sided spastic hemiparesis, HTN, seizure disorder, peripheral arterial disease critical limb ischemia of his right lower extremity who presents with about a week of consistent and progressive altered mental status and is admitted for critical limb ischemia  of the right lower extremity with gangrene, on hospital day 8.  S/p right AKA 07/30/2022 Right lower extremity critical ischemia with gangrene Right lower extremity cellulitis Underwent right AKA 2/16. Did have some delirium after surgery which resolved after couple days. Vancomycin and cefepime 2/13 - 2/16.  No dressing on wound today and wound appears clean and dry.  Continues to have some nausea and now has some belly pain.  No significant abdominal distention, rigidity, or rebound tenderness.  Last bowel movement was yesterday. - Tylenol 1 g 3 times daily  - Oxycodone 5 mg every 6 hours scheduled - Senokot twice daily and MiraLAX - PT/OT recommend SNF - baclofen 5 mg 3 times daily - Scheduled metoprolol 25 mg twice daily - Incentive spirometer for atelectasis  Abdominal pain and nausea Patient has abdominal pain and nausea as above which will be secondary to opiate induced constipation.  We will further assess with a KUB and continue on bowel regimen as above.  Abdomen does not appear acute but we will reassess and change approach as needed. -KUB today with nonobstructive bowel gas pattern  Delirium 2/2 infection, (increased risk due to prior CVA, and hx of seizures) Mental status stable today and he is now alert and oriented to person, place, and situation with prompting.  He is at still at risk for delirium exacerbation after surgery, having pain, and just being in the hospital. He is also at risk for an infection. We will monitor closely as above and treat his pain.   Microscopic Hematuria Simple cyst right kidney On admission UA there was 6-10 RBCs on UA and CT of the abd/pel w/ contrast showed indeterminate 3 cm exophytic interpolar right renal lesion.  Urinalysis showed calcium oxalate crystals, 11-20 RBCs, and small amount of bilirubin.  Renal ultrasound demonstrated this mass to be a simple cyst.  He did not have any stones seen on CT of the abdomen pelvis with contrast on  admission.  He is having some nausea as above but no flank pain and no groin pain aside from the pain he gets in his right leg with spasms.  If he does develop flank pain, persistent groin pain, or pain with urination we will likely get a CT of the abdomen pelvis without contrast to evaluate for stones.  In the meantime we will continue with good hydration and treat his nausea so that he has adequate p.o. intake.  Of note he does have a significant smoking history which puts  him at high risk for bladder cancer but admission CT abdomen pelvis with contrast showed no bladder abnormalities.   Pre-renal AKI, resolved Patient presented with creatinine of 1.30 up from baseline of below 1. Responded well to IV fluids.   Hypokalemia, resolved Potassium 3.3 on admission and stable after supplementation.   Peripheral arterial disease Patient had critical limb ischemia with gangrene of his right lower extremity treated with right AKA as above but also has diminished/absent pulses in his left lower extremity.  - Continue aspirin   Prior left ACA CVA with residual right-sided spastic hemiplegia Seizure disorder Patient did present altered as above but does not appear to have any new focal neurologic deficits and had no evidence of intracranial hemorrhage on CT scan.  He also has been taking his antiepileptics consistently at home and has not had a seizure in at least a year. - Continue home Depakote 1500 mg daily, Vimpat 50 mg twice daily, and Keppra 1500 mg twice daily - Continue baclofen 5 mg 3 times daily, may increase to effect after 3 days  Normocytic anemia Patient has developed anemia after presentation and has been largely stable.  Microscopic hematuria and workup as above.  Iron studies did not show any iron deficiency.  Last colonoscopy in 2020 showed 1 small tubular adenoma.   Hypertension BP slightly low this morning at 92/60.  Patient is not having good p.o. intake.  We will give him a liter of  fluid today. - Continue amlodipine 10 mg daily and metoprolol 25 mg twice daily  Diet: Heart Healthy IVF: None, VTE: Enoxaparin  Code: Full Family Update: We will call family with update.   Dispo: Pending adequate p.o. intake and SNF placement.   Johny Blamer, DO Internal Medicine Resident PGY-1 Pager: [phone]  Please contact the on call pager after 5 pm and on weekends at [phone].

## 2022-08-04 NOTE — TOC Progression Note (Signed)
Transition of Care [facility]) - Progression Note    Patient Details  Name: Gerald [name] MRN: [id number] Date of Birth: [birth year]  Transition of Care [facility]) CM/SW Contact  Vinie Sill, [place] Phone Number: 08/04/2022, 3:33 PM  Clinical Narrative:     Called patient's sister, Sherrine Maples, verbally provided SNF bed offers at this time.  Thurmond Butts, MSW, LCSW Clinical Social Worker    Expected Discharge Plan: Skilled Nursing Facility Barriers to Discharge: Continued Medical Work up  Expected Discharge Plan and Services In-house Referral: Clinical Social Work     Living arrangements for the past 2 months: Apartment                                       Social Determinants of Health (SDOH) Interventions SDOH Screenings   Food Insecurity: No Food Insecurity (07/29/2022)  Housing: Low Risk  (07/29/2022)  Transportation Needs: No Transportation Needs (07/29/2022)  Utilities: Not At Risk (07/29/2022)  Depression (PHQ2-9): Low Risk  (07/15/2022)  Social Connections: Moderately Integrated (06/15/2022)  Tobacco Use: High Risk (08/01/2022)    Readmission Risk Interventions     No data to display

## 2022-08-04 NOTE — Progress Notes (Addendum)
[facility]  Subjective  - No new complaints   Objective 120/73 76 (!) 97.4 F (36.3 C) (Oral) 19 96%  Intake/Output Summary (Last 24 hours) at 08/04/2022 0802 Last data filed at 08/04/2022 [id number] Gross per 24 hour  Intake --  Output 650 ml  Net -650 ml    Right AKA is healing well and appears viable Heel protector on left LE Lungs non labored breathing  Assessment/Planning: 62 y.o. male with critical limb ischemia of right lower extremity in non functional RLE from prior CVA   POD # 5  Retention sock in room Right AKA staples retained he will f/u 4 weeks post op for incision check and staples removal. PT/OT for mobility as tolerates We will sign off for now call [facility] with any concerns  Roxy Horseman 08/04/2022 8:02 AM --  Laboratory Lab Results: Recent Labs    08/03/22 0152 08/04/22 0143  WBC 12.8* 10.6*  HGB 9.8* 9.7*  HCT 29.0* 29.4*  PLT 223 244   BMET Recent Labs    08/03/22 0802  NA 138  K 4.1  CL 103  CO2 27  GLUCOSE 101*  BUN 10  CREATININE 0.59*  CALCIUM 8.9    COAG Lab Results  Component Value Date   INR 1.2 07/27/2022   INR 1.7 (H) 05/27/2022   INR 1.20 05/22/2016   No results found for: "PTT"   I have interviewed and examined patient with PA and agree with assessment and plan above.   Eliz Nigg C. Donzetta Matters, MD [facility] [place] Office: [phone] Pager: [phone]

## 2022-08-04 NOTE — Progress Notes (Signed)
Initial Nutrition Assessment  DOCUMENTATION CODES:   Not applicable  INTERVENTION:   Liberalize pt diet to regular due to poor PO intake  Encourage good PO intake Ensure Enlive po BID, each supplement provides 350 kcal and 20 grams of protein. Multivitamin w/ minerals daily Family able to bring in food as desires Recommend obtaining new weight.  NUTRITION DIAGNOSIS:   Increased nutrient needs related to post-op healing, wound healing as evidenced by estimated needs.  GOAL:   Patient will meet greater than or equal to 90% of their needs  MONITOR:   PO intake, Supplement acceptance, Weight trends, Labs, Skin  REASON FOR ASSESSMENT:   Consult Assessment of nutrition requirement/status  ASSESSMENT:   62 y.o. male presented to the ED with altered mental status. PMH includes HTN, PAD, RLE ischemia, seizures, CVA with R side spastic hemiparesis. Pt admitted with RLE critical ischemia with gangrene and AKI.   2/13 - Admitted 2/16 - OR s/p R AKA  Met with pt in room, with sister at bedside. Pt reports that his appetite has not been good. Although, did tell RD that he was hungry. Sister reports that pt has had a poor appetite for about one month. Sister reports that pt is really only taking bites of meals and did not eat breakfast this morning. Both are unsure about weight loss, but sister suspects that pt has lost weight. Pt with no weight this admission, RD requested weight from RN.   Discussed adjusting pt diet to regular to give pt more options at meal times in hope that he eats more. Encouraged family to bring outside food if pt desires something. Discussed using oral nutrition supplements as well, pt agreeable to Ensure.   Medications reviewed and include: Colace, Protonix, Miralax, Senokot-S Labs reviewed.  NUTRITION - FOCUSED PHYSICAL EXAM:  Deferred to follow-up.   Diet Order:   Diet Order             Diet Heart Room service appropriate? Yes with Assist; Fluid  consistency: Thin  Diet effective now                   EDUCATION NEEDS:   Education needs have been addressed  Skin:  Skin Assessment: Skin Integrity Issues: Skin Integrity Issues:: Incisions Incisions: RLE  Last BM:  2/20  Height:  Ht Readings from Last 1 Encounters:  07/27/22 6' (1.829 m)   Weight:  Wt Readings from Last 1 Encounters:  07/21/22 109.8 kg   Ideal Body Weight:  80.9 kg  BMI:  Body mass index is 32.82 kg/m.  Estimated Nutritional Needs:  Kcal:  [id number] Protein:  115-130 grams Fluid:  >/= 2 L   Hermina Barters RD, LDN Clinical Dietitian See [facility] for contact information.

## 2022-08-04 NOTE — NC FL2 (Addendum)
[place] LEVEL OF CARE FORM     IDENTIFICATION  Patient Name: Gerald [name] Birthdate: Dec 14, 1960 Sex: male Admission Date (Current Location): 07/27/2022  [facility] and [place] Number:  Herbalist and Address:  [facility] [place], [place] [address], [place], [place] [address]      Provider Number: [id number]  Attending Physician Name and Address:  Angelica Pou, MD  Relative Name and Phone Number:       Current Level of Care: Hospital Recommended Level of Care: [place] Prior Approval Number:    Date Approved/Denied:   PASRR Number:  [id number]  Discharge Plan: SNF    Current Diagnoses: Patient Active Problem List   Diagnosis Date Noted   Normocytic anemia 07/29/2022   Acute delirium 07/28/2022   Critical limb ischemia of right lower extremity with gangrene ([place]) 07/27/2022   Sepsis ([place]) 07/27/2022   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   PAD (peripheral artery disease) ([place]) 07/21/2022   ASCVD (arteriosclerotic cardiovascular disease) 06/15/2022   Rhinosinusitis 06/15/2022   COVID-19 05/28/2022   History of stroke 06/09/2017   Seizure disorder ([place]) 05/22/2016   Anxiety state 04/20/2016   Spastic hemiplegia affecting dominant side ([place]) 09/11/2015   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    Essential hypertension 01/15/2013   Nicotine dependence 01/15/2013   Alcohol use disorder 01/15/2013    Orientation RESPIRATION BLADDER Height & Weight     Self, Place  Normal External catheter, Incontinent Weight:   Height:  6' (182.9 cm)  BEHAVIORAL SYMPTOMS/MOOD NEUROLOGICAL BOWEL NUTRITION STATUS      Incontinent Diet (please see discharge summary)  AMBULATORY STATUS COMMUNICATION OF NEEDS Skin   Extensive Assist Verbally Surgical wounds (s/p Right AKA)                       Personal Care Assistance Level of Assistance  Bathing, Feeding, Dressing Bathing Assistance: Limited assistance Feeding  assistance: Limited assistance (supervision) Dressing Assistance: Maximum assistance     Functional Limitations Info  Sight, Hearing, Speech Sight Info: Adequate Hearing Info: Adequate Speech Info: Impaired (slurred)    SPECIAL CARE FACTORS FREQUENCY  PT (By licensed PT), OT (By licensed OT)     PT Frequency: 5x per wek OT Frequency: 5x per week            Contractures Contractures Info: Not present    Additional Factors Info  Code Status, Allergies Code Status Info: FULL Allergies Info: NKA           Current Medications (08/04/2022):  This is the current hospital active medication list Current Facility-Administered Medications  Medication Dose Route Frequency Provider Last Rate Last Admin   acetaminophen (TYLENOL) tablet 1,000 mg  1,000 mg Oral Q8H Johny Blamer, DO   1,000 mg at 08/04/22 0547   alum & mag hydroxide-simeth (MAALOX/MYLANTA) 200-200-20 MG/5ML suspension 15-30 mL  15-30 mL Oral Q2H PRN Baglia, Corrina, PA-C       amLODipine (NORVASC) tablet 10 mg  10 mg Oral Daily Baglia, Corrina, PA-C   10 mg at 08/04/22 0944   aspirin EC tablet 81 mg  81 mg Oral Daily Fulvio, [place], DO   81 mg at 08/04/22 0944   atorvastatin (LIPITOR) tablet 20 mg  20 mg Oral q1800 Baglia, Corrina, PA-C   20 mg at 08/03/22 1627   baclofen (LIORESAL) tablet 5 mg  5 mg Oral TID Johny Blamer, DO   5 mg at 08/04/22 [phone]  bisacodyl (DULCOLAX) EC tablet 5 mg  5 mg Oral Daily PRN Baglia, Corrina, PA-C       diphenhydrAMINE (BENADRYL) 12.5 MG/5ML elixir 12.5-25 mg  12.5-25 mg Oral Q4H PRN Baglia, Corrina, PA-C       divalproex (DEPAKOTE ER) 24 hr tablet 1,500 mg  1,500 mg Oral QHS Baglia, Corrina, PA-C   1,500 mg at 08/03/22 2109   docusate sodium (COLACE) capsule 100 mg  100 mg Oral Daily Baglia, Corrina, PA-C   100 mg at 08/04/22 0945   enoxaparin (LOVENOX) injection 50 mg  50 mg Subcutaneous Q24H Johny Blamer, DO   50 mg at 08/03/22 1627   guaiFENesin-dextromethorphan (ROBITUSSIN  DM) 100-10 MG/5ML syrup 15 mL  15 mL Oral Q4H PRN Baglia, Corrina, PA-C       hydrALAZINE (APRESOLINE) injection 5 mg  5 mg Intravenous Q20 Min PRN Baglia, Corrina, PA-C       labetalol (NORMODYNE) injection 10 mg  10 mg Intravenous Q10 min PRN Baglia, Corrina, PA-C       lacosamide (VIMPAT) tablet 50 mg  50 mg Oral BID Baglia, Corrina, PA-C   50 mg at 08/04/22 [id number]   lactated ringers infusion   Intravenous Continuous Johny Blamer, DO 125 mL/hr at 08/04/22 0955 New Bag at 08/04/22 0955   levETIRAcetam (KEPPRA) tablet 1,500 mg  1,500 mg Oral BID Baglia, Corrina, PA-C   1,500 mg at 08/04/22 0944   magnesium sulfate IVPB 2 g 50 mL  2 g Intravenous Daily PRN Baglia, Corrina, PA-C       metoprolol tartrate (LOPRESSOR) injection 2-5 mg  2-5 mg Intravenous Q2H PRN Baglia, Corrina, PA-C   5 mg at 07/31/22 1207   metoprolol tartrate (LOPRESSOR) tablet 25 mg  25 mg Oral BID Delene Ruffini, MD   25 mg at 08/04/22 0944   morphine (PF) 2 MG/ML injection 0.5-1 mg  0.5-1 mg Intravenous Q2H PRN Baglia, Corrina, PA-C   0.5 mg at 08/02/22 1354   ondansetron (ZOFRAN) injection 4 mg  4 mg Intravenous Q6H PRN Baglia, Corrina, PA-C       oxyCODONE (Oxy IR/ROXICODONE) immediate release tablet 5 mg  5 mg Oral Q4H Johny Blamer, DO   5 mg at 08/04/22 0944   pantoprazole (PROTONIX) EC tablet 40 mg  40 mg Oral Daily Baglia, Corrina, PA-C   40 mg at 08/04/22 0944   phenol (CHLORASEPTIC) mouth spray 1 spray  1 spray Mouth/Throat PRN Baglia, Corrina, PA-C       polyethylene glycol (MIRALAX / GLYCOLAX) packet 17 g  17 g Oral Daily Johny Blamer, DO   17 g at 08/04/22 0945   potassium chloride SA (KLOR-CON M) CR tablet 20-40 mEq  20-40 mEq Oral Daily PRN Baglia, Corrina, PA-C       senna-docusate (Senokot-S) tablet 2 tablet  2 tablet Oral BID Johny Blamer, DO   2 tablet at 08/04/22 0945     Discharge Medications: Please see discharge summary for a list of discharge medications.  Relevant Imaging  Results:  Relevant Lab Results:   Additional Information SSN [id number]  Vinie Sill, LCSW

## 2022-08-05 MED ORDER — OXYCODONE HCL 5 MG PO TABS
5.0000 mg | ORAL_TABLET | ORAL | Status: AC
  Administered 2022-08-05 – 2022-08-06 (×5): 5 mg via ORAL
  Filled 2022-08-05 (×5): qty 1

## 2022-08-05 NOTE — TOC Progression Note (Addendum)
Transition of Care [facility]) - Progression Note    Patient Details  Name: Gerald [name] MRN: [id number] Date of Birth: [birth year]  [facility]) CM/SW [place], [place] Phone Number: 08/05/2022, 8:12 AM  Clinical Narrative:     Patient's sister selected Gerald [name] for  SNF placement - CSW will send message to SNF for confirmation.  TOC will continue to follow and assist with discharge planning.  Thurmond Butts, MSW, LCSW Clinical Social Worker    Expected Discharge Plan: Skilled Nursing Facility Barriers to Discharge: Continued Medical Work up  Expected Discharge Plan and Services In-house Referral: Clinical Social Work     Living arrangements for the past 2 months: Apartment                                       Social Determinants of Health (SDOH) Interventions SDOH Screenings   Food Insecurity: No Food Insecurity (07/29/2022)  Housing: Low Risk  (07/29/2022)  Transportation Needs: No Transportation Needs (07/29/2022)  Utilities: Not At Risk (07/29/2022)  Depression (PHQ2-9): Low Risk  (07/15/2022)  Social Connections: Moderately Integrated (06/15/2022)  Tobacco Use: High Risk (08/01/2022)    Readmission Risk Interventions     No data to display

## 2022-08-05 NOTE — Progress Notes (Signed)
HD#9 Subjective:   Summary: Gerald [name] is a 62 y.o. 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  Overnight Events: None  Patient is doing well today.  He is up in the chair and he is not having any more belly pain.  He is also not having significant pain in his right AKA.  He has no new or worsening complaints.  Objective:  Vital signs in last 24 hours: Vitals:   08/05/22 0246 08/05/22 0250 08/05/22 0403 08/05/22 0832  BP:   132/78 (!) 155/92  Pulse: 84 90 75   Resp:   15 16  Temp:   97.6 F (36.4 C) 97.8 F (36.6 C)  TempSrc:   Oral Axillary  SpO2: 96% 90% 91% 95%  Weight:      Height:       Supplemental O2: Room Air SpO2: 95 % O2 Flow Rate (L/min): 2 L/min   Physical Exam:  Constitutional: Chronically ill-appearing male appears older than stated age, in no acute distress Pulmonary/Chest: Decreased breath sounds in the bases bilaterally again improved from yesterday with clear lung sounds superiorly Abdomen: Soft and nontender, mild distention, positive bowel sounds MSK: S/p right AKA with dressing sock in place.  Left lower extremity is warm with Prevnar boot in place. Neuro: Alert and oriented to person, place, and situation with prompting.  Chronic right-sided weakness present on exam with very limited active movement of his right upper extremity and intermittent spasms of hip flexion noted right lower extremity.  BP (!) 155/92 (BP Location: Right Arm)   Pulse 75   Temp 97.8 F (36.6 C) (Axillary)   Resp 16   Ht 6' (1.829 m)   Wt 88.4 kg   SpO2 95%   BMI 26.43 kg/m     Intake/Output Summary (Last  24 hours) at 08/05/2022 1630 Last data filed at 08/05/2022 0500 Gross per 24 hour  Intake 1253.54 ml  Output 450 ml  Net 803.54 ml   Net IO Since Admission: 585.04 mL [08/05/22 1630]  Pertinent Labs:    Latest Ref Rng & Units 08/04/2022    1:43 AM 08/03/2022    1:52 AM 08/02/2022    1:19 AM  CBC  WBC 4.0 - 10.5 K/uL 10.6  12.8  14.3   Hemoglobin 13.0 - 17.0 g/dL 9.7  9.8  10.5   Hematocrit 39.0 - 52.0 % 29.4  29.0  31.8   Platelets 150 - 400 K/uL 244  223  237        Latest Ref Rng & Units 08/03/2022    8:02 AM 08/01/2022    1:18 AM 07/31/2022    1:30 AM  CMP  Glucose 70 - 99 mg/dL 101  112  91   BUN 6 - 20 mg/dL 10  9  8   $ Creatinine 0.61 - 1.24 mg/dL 0.59  0.79  0.72   Sodium 135 - 145 mmol/L 138  137  139   Potassium 3.5 - 5.1 mmol/L 4.1  3.9  4.3   Chloride 98 - 111 mmol/L 103  106  110   CO2 22 - 32 mmol/L 27  24  22   $ Calcium 8.9 -  10.3 mg/dL 8.9  9.0  8.8     Assessment/Plan:   Principal Problem:   Above knee amputation of right lower extremity (HCC) Active Problems:   Essential hypertension   Spastic hemiplegia affecting dominant side (HCC)   Seizure disorder (HCC)   History of stroke   PAD (peripheral artery disease) (HCC)   Critical limb ischemia of right lower extremity with gangrene (HCC)   Normocytic anemia   Gross hematuria   Patient Summary: Gerald [name] is a 62 y.o. 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  S/p right AKA 07/30/2022 Right lower extremity critical ischemia with gangrene Right lower extremity cellulitis Underwent right AKA 2/16. Did have some delirium after surgery which resolved after couple  days. Vancomycin and cefepime 2/13 - 2/16.  Doing well today with sock on right AKA. - Tylenol 1 g 3 times daily  - Oxycodone 5 mg every 6 hours scheduled, current schedule ends 2/24 at 8 AM - Senokot twice daily and MiraLAX - PT/OT recommend SNF - baclofen 5 mg 3 times daily - Scheduled metoprolol 25 mg twice daily - Incentive spirometer for atelectasis  Abdominal pain and nausea Improved today and he had a bowel movement yesterday.  Will continue to monitor and he has as needed nausea medication as well as a scheduled bowel regimen.  Delirium 2/2 infection, (increased risk due to prior CVA, and hx of seizures) Mental status stable today and he is now alert and oriented to person, place, and situation with prompting.  He is at still at risk for delirium exacerbation after surgery, having pain, and just being in the hospital.  Microscopic Hematuria Simple cyst right kidney On admission UA there was 6-10 RBCs on UA and CT of the abd/pel w/ contrast showed indeterminate 3 cm exophytic interpolar right renal lesion.  Urinalysis showed calcium oxalate crystals, 11-20 RBCs, and small amount of bilirubin.  Renal ultrasound demonstrated this mass to be a simple cyst.  He did not have any stones seen on CT of the abdomen pelvis with contrast on admission.  He is having some nausea as above but no flank pain and no groin pain aside from the pain he gets in his right leg with spasms.  If he does develop flank pain, persistent groin pain, or pain with urination we will likely get a CT of the abdomen pelvis without contrast to evaluate for stones.  In the meantime we will continue with good hydration and treat his nausea so that he has adequate p.o. intake.  Of note he does have a significant smoking history which puts him at high risk for bladder cancer but admission CT abdomen pelvis with contrast showed no bladder abnormalities.   Pre-renal AKI, resolved Patient presented with creatinine of 1.30 up from  baseline of below 1. Responded well to IV fluids.   Hypokalemia, resolved Potassium 3.3 on admission and stable after supplementation.   Peripheral arterial disease Patient had critical limb ischemia with gangrene of his right lower extremity treated with right AKA as above but also has diminished/absent pulses in his left lower extremity.  - Continue aspirin   Prior left ACA CVA with residual right-sided spastic hemiplegia Seizure disorder Patient  did present altered as above but does not appear to have any new focal neurologic deficits and had no evidence of intracranial hemorrhage on CT scan.  He also has been taking his antiepileptics consistently at home and has not had a seizure in at least a year. - Continue home Depakote 1500 mg daily, Vimpat 50 mg twice daily, and Keppra 1500 mg twice daily - Continue baclofen 5 mg 3 times daily, may increase to effect after 3 days  Normocytic anemia Patient has developed anemia after presentation and has been largely stable.  Microscopic hematuria and workup as above.  Iron studies did not show any iron deficiency.  Last colonoscopy in 2020 showed 1 small tubular adenoma.   Hypertension Blood pressure normotensive and stable after getting fluids yesterday.  We will continue to monitor and give fluids as needed as his p.o. intake is limited by his functional status. - Continue amlodipine 10 mg daily and metoprolol 25 mg twice daily  Diet: Heart Healthy IVF: None, VTE: Enoxaparin  Code: Full Family Update: We will call family with update.   Dispo: SNF placement.   Johny Blamer, DO Internal Medicine Resident PGY-1 Pager: [phone]  Please contact the on call pager after 5 pm and on weekends at [phone].

## 2022-08-05 NOTE — Progress Notes (Signed)
Monitor SPO2 on room air at night time is 90-96%. No respiratory distress. Pt is stable hemodynamically, remaining afebrile. Right AKA incision has minimal serosanguinous drainage. Dry dressing is applied. Pain is well tolerated. Pt is able to sleep well tonight. No acute distress noted.  We will continue to monitor.   Kennyth Lose, RN

## 2022-08-05 NOTE — Progress Notes (Signed)
Occupational Therapy Treatment Patient Details Name: Gerald [name] MRN: [id number] DOB: [birth year] Today's Date: 08/05/2022   History of present illness Pt is a 62 y/o M admitted on 07/27/22 after presenting with c/o ~week of AMS. Pt found to have RLE critical ischemia with gangrene, RLE cellulitis, & acute encephalopathy/delirium. 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   OT comments  Patient assisted from recliner back to the bed.  [place] equipment used this session.  Patient able to roll side to side with Mod to Max A to remove pad, and ultimately +2 to side up in the bed.  OT to continue efforts in the acute setting to address deficits and assist with eventual transition to SNF.     Recommendations for follow up therapy are one component of a multi-disciplinary discharge planning process, led by the attending physician.  Recommendations may be updated based on patient status, additional functional criteria and insurance authorization.    Follow Up Recommendations  Skilled nursing-short term rehab (<3 hours/day)     Assistance Recommended at Discharge Frequent or constant Supervision/Assistance  Patient can return home with the following  Two people to help with walking and/or transfers;Two people to help with bathing/dressing/bathroom;Assistance with cooking/housework;Assistance with feeding;Direct supervision/assist for medications management   Equipment Recommendations       Recommendations for Other Services      Precautions / Restrictions Precautions Precautions: Fall Precaution Comments: s/p R AKA 07/30/22, old R hemi Required Braces or Orthoses: Other Brace Other Brace: R AKA retention sock placed 2/22 after PA placed new dressing for drainage Restrictions Weight Bearing Restrictions: Yes RLE Weight Bearing: Non weight bearing        Mobility Bed Mobility   Bed Mobility: Rolling Rolling: Mod assist, Max assist              Transfers Overall transfer level: Needs assistance Equipment used: Ambulation equipment used                         Cognition Arousal/Alertness: Awake/alert Behavior During Therapy: WFL for tasks assessed/performed Overall Cognitive Status: Within Functional Limits for tasks assessed                                                             Pertinent Vitals/ Pain       Pain Assessment Pain Assessment: Faces Faces Pain Scale: Hurts even more Pain Location: RLE Pain Descriptors / Indicators: Grimacing, Sore, Discomfort Pain Intervention(s): Monitored during session                                                          Frequency  Min 2X/week        Progress Toward Goals  OT Goals(current goals can now be found in the care plan section)  Progress towards OT goals: Progressing toward goals  Acute Rehab OT Goals OT Goal Formulation: With patient Time For Goal Achievement: 08/14/22 Potential to Achieve Goals: Fair  Plan Discharge plan remains appropriate    Co-evaluation                 AM-PAC OT "6 Clicks" Daily Activity     Outcome Measure   Help from another person eating meals?: A Lot Help from another person taking care of personal grooming?: A Lot Help from another person toileting, which includes using toliet, bedpan, or urinal?: Total Help from another person bathing (including washing, rinsing, drying)?: Total Help from another person to put on and taking off regular upper body clothing?: A Lot Help from another person to put on and taking off regular lower body clothing?: Total 6 Click Score: 9    End of Session Equipment Utilized During Treatment: Other (comment) (sara lift)  OT Visit Diagnosis: History of falling (Z91.81);Unsteadiness on feet (R26.81);Other abnormalities of  gait and mobility (R26.89);Hemiplegia and hemiparesis Hemiplegia - Right/Left: Right Hemiplegia - dominant/non-dominant: Dominant Hemiplegia - caused by: Cerebral infarction   Activity Tolerance Patient tolerated treatment well   Patient Left in bed;with call bell/phone within reach;with family/visitor present;with nursing/sitter in room   Nurse Communication Mobility status        Time: [id number] OT Time Calculation (min): 16 min  Charges: OT General Charges $OT Visit: 1 Visit OT Treatments $Therapeutic Activity: 8-22 mins  08/05/2022  RP, OTR/L  Acute Rehabilitation Services  Office:  [phone]   Metta Clines 08/05/2022, 4:53 PM

## 2022-08-05 NOTE — Progress Notes (Signed)
Physical Therapy Treatment Patient Details Name: Gerald [name] MRN: [id number] DOB: 07-28-1960 Today's Date: 08/05/2022   History of Present Illness Pt is a 62 y/o M admitted on 07/27/22 after presenting with c/o ~week of AMS. Pt found to have RLE critical ischemia with gangrene, RLE cellulitis, & acute encephalopathy/delirium. 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    PT Comments    Pt received in supine, agreeable to therapy session, pt with slow processing and following ~50% of simple mobility cues with increased time. Pt with good tolerance for bed mobility and needs up to +2 totalA for posterior seated scoot transfer from bed to chair. Pt with LUE weakness in addition to R hemi and unable to significantly assist with scoot transfer so hoyer pad underneath him in chair for ease of return transfer later in the day, RN/NT notified. Retention sock placed with waist strap donned and pt reports improved comfort with sock in place. Chair alarm on for safety while pt in recliner. Pt continues to benefit from PT services to progress toward functional mobility goals.   Recommendations for follow up therapy are one component of a multi-disciplinary discharge planning process, led by the attending physician.  Recommendations may be updated based on patient status, additional functional criteria and insurance authorization.  Follow Up Recommendations  Skilled nursing-short term rehab (<3 hours/day) Can patient physically be transported by private vehicle: No   Assistance Recommended at Discharge Frequent or constant Supervision/Assistance  Patient can return home with the following Two people to help with walking and/or transfers;Two people to help with bathing/dressing/bathroom;Help with stairs or ramp for entrance;Direct supervision/assist for medications  management;Assist for transportation;Assistance with cooking/housework;Direct supervision/assist for financial management   Equipment Recommendations  None recommended by PT (TBD post-acute, currently would need hospital bed and mechanical lift)    Recommendations for Other Services       Precautions / Restrictions Precautions Precautions: Fall Precaution Comments: s/p R AKA 07/30/22, old R hemi Required Braces or Orthoses: Other Brace Other Brace: R AKA retention sock placed 2/22 after PA placed new dressing for drainage Restrictions Weight Bearing Restrictions: Yes RLE Weight Bearing: Non weight bearing     Mobility  Bed Mobility Overal bed mobility: Needs Assistance Bed Mobility: Rolling, Sidelying to Sit Rolling: Max assist, +2 for physical assistance Sidelying to sit: Mod assist, +2 for physical assistance, HOB elevated       General bed mobility comments: Pt requires cuing to initiate moving BLE towards EOB but ultimately requires max assist +2 to roll toward his L side. Pt able to assist with propping onto his L elbow to lift his trunk with log roll transfer to EOB.    Transfers Overall transfer level: Needs assistance Equipment used: 2 person hand held assist (bed pad and HHA) Transfers: Bed to chair/wheelchair/BSC         Anterior-Posterior transfers: Total assist, +2 physical assistance, +2 safety/equipment   General transfer comment: Visual/verbal demo for technique to assist with lateral leaning and lifting hips up/backward along with LUE support and anterior lean while deweighting hips for posterior scoot from bed>chair. Pt unable to appropriately follow cues for LUE engagement while performing posterior scoots.      Balance Overall balance assessment: Needs assistance Sitting-balance support: Feet supported, Single extremity supported Sitting balance-Leahy Scale: Poor Sitting balance - Comments: Pt able to obtain & maintain  midline orientation in long  sitting in bed with frequent cues for improved LUE placement and variable min guard to minA. Heavier assist during dynamic leaning/reaching tasks. Pt unable to use RUE effectively to assist with seated balance.                                    Cognition Arousal/Alertness: Awake/alert Behavior During Therapy: WFL for tasks assessed/performed Overall Cognitive Status: Within Functional Limits for tasks assessed                                 General Comments: Expressive deficits 2/2 hx of CVA, difficult to assess cognition due to slow processing and poor carryover of cueing within session, unsure of his cognitive baseline, pt follows ~50% of simple mobility commands, pt demonstrates fear of falls. Pt pleasant and cooperative as able.        Exercises General Exercises - Lower Extremity Ankle Circles/Pumps: AAROM, Left, 10 reps, Supine Heel Slides: AAROM, Supine, Strengthening, Left, 5 reps Amputee Exercises Hip Flexion/Marching: AROM, Right, 5 reps, Supine, AAROM Other Exercises Other Exercises: IS x 5 reps achieves 700-900 mL    General Comments        Pertinent Vitals/Pain Pain Assessment Pain Assessment: Faces Faces Pain Scale: Hurts little more Pain Location: RLE Pain Descriptors / Indicators: Grimacing, Sore, Discomfort Pain Intervention(s): Limited activity within patient's tolerance, Monitored during session, Premedicated before session, Repositioned, Other (comment) (pt reports increased R AKA comfort after retention sock placed)     PT Goals (current goals can now be found in the care plan section) Acute Rehab PT Goals Patient Stated Goal: none stated PT Goal Formulation: With patient Time For Goal Achievement: 08/13/22 Progress towards PT goals: Progressing toward goals    Frequency    Min 3X/week      PT Plan Current plan remains appropriate       AM-PAC PT "6 Clicks" Mobility   Outcome Measure  Help needed turning from  your back to your side while in a flat bed without using bedrails?: Total Help needed moving from lying on your back to sitting on the side of a flat bed without using bedrails?: A Lot Help needed moving to and from a bed to a chair (including a wheelchair)?: Total Help needed standing up from a chair using your arms (e.g., wheelchair or bedside chair)?: Total Help needed to walk in hospital room?: Total Help needed climbing 3-5 steps with a railing? : Total 6 Click Score: 7    End of Session   Activity Tolerance: Patient tolerated treatment well;Patient limited by pain;Patient limited by fatigue Patient left: with call bell/phone within reach;with family/visitor present;Other (comment);in chair;with chair alarm set (LLE with prevlon boot donned, reclined in chair, spouse [name] present, pt with lift pad under his hips in recliner) Nurse Communication: Mobility status;Need for lift equipment;Other (comment) (pt spouse requesting a bath for him (NT notified)) PT Visit Diagnosis: Muscle weakness (generalized) (M62.81);Other abnormalities of gait and mobility (R26.89);Pain Pain - Right/Left: Right Pain - part of body: Arm;Leg     Time: [id number] PT Time Calculation (min) (ACUTE ONLY): 30 min  Charges:  $Therapeutic Exercise: 8-22 mins $Therapeutic Activity: 8-22 mins                     Justun Anaya P., PTA Acute Rehabilitation Services Secure Chat  Preferred 9a-5:30pm Office: [place] 08/05/2022, 2:57 PM

## 2022-08-05 NOTE — TOC Progression Note (Signed)
Transition of Care [facility]) - Progression Note    Patient Details  Name: JAIRE CLAYDON MRN: [id number] Date of Birth: 14-May-1961  Transition of Care [facility]) CM/SW [place], [place] Phone Number: 08/05/2022, 10:53 AM  Clinical Narrative:     Isaias Cowman confirmed bed availability Insurance authorization was started for SNF & [facility] CSW called and updated patient's sister.  TOC will continue to follow and assist with discharge summary.  Thurmond Butts, MSW, LCSW Clinical Social Worker    Expected Discharge Plan: Skilled Nursing Facility Barriers to Discharge: Continued Medical Work up  Expected Discharge Plan and Services In-house Referral: Clinical Social Work     Living arrangements for the past 2 months: Apartment                                       Social Determinants of Health (SDOH) Interventions SDOH Screenings   Food Insecurity: No Food Insecurity (07/29/2022)  Housing: Low Risk  (07/29/2022)  Transportation Needs: No Transportation Needs (07/29/2022)  Utilities: Not At Risk (07/29/2022)  Depression (PHQ2-9): Low Risk  (07/15/2022)  Social Connections: Moderately Integrated (06/15/2022)  Tobacco Use: High Risk (08/01/2022)    Readmission Risk Interventions     No data to display

## 2022-08-05 NOTE — Progress Notes (Signed)
   CC: RN called and asked me to inspect the lateral incision right AKA for drainage   Right AKA staple intact, very minimal SS drainage lateral incision No erythema or edema Warm to touch  Stump appears viable without signs of infection. Small dry dressing placed over lateral stump.  When stump is clean and dry the retention sock may be placed.   Stable  Roxy Horseman PA-C

## 2022-08-06 DIAGNOSIS — L03115 Cellulitis of right lower limb: Secondary | ICD-10-CM | POA: Diagnosis not present

## 2022-08-06 DIAGNOSIS — I70261 Atherosclerosis of native arteries of extremities with gangrene, right leg: Secondary | ICD-10-CM | POA: Diagnosis not present

## 2022-08-06 DIAGNOSIS — Z89611 Acquired absence of right leg above knee: Secondary | ICD-10-CM | POA: Diagnosis not present

## 2022-08-06 DIAGNOSIS — F17210 Nicotine dependence, cigarettes, uncomplicated: Secondary | ICD-10-CM | POA: Diagnosis not present

## 2022-08-06 LAB — CBC
HCT: 29.5 % — ABNORMAL LOW (ref 39.0–52.0)
Hemoglobin: 9.5 g/dL — ABNORMAL LOW (ref 13.0–17.0)
MCH: 31.7 pg (ref 26.0–34.0)
MCHC: 32.2 g/dL (ref 30.0–36.0)
MCV: 98.3 fL (ref 80.0–100.0)
Platelets: 307 10*3/uL (ref 150–400)
RBC: 3 MIL/uL — ABNORMAL LOW (ref 4.22–5.81)
RDW: 14.3 % (ref 11.5–15.5)
WBC: 9.2 10*3/uL (ref 4.0–10.5)
nRBC: 0 % (ref 0.0–0.2)

## 2022-08-06 LAB — BASIC METABOLIC PANEL
Anion gap: 6 (ref 5–15)
BUN: <5 mg/dL — ABNORMAL LOW (ref 6–20)
CO2: 29 mmol/L (ref 22–32)
Calcium: 8.7 mg/dL — ABNORMAL LOW (ref 8.9–10.3)
Chloride: 103 mmol/L (ref 98–111)
Creatinine, Ser: 0.58 mg/dL — ABNORMAL LOW (ref 0.61–1.24)
GFR, Estimated: >60 mL/min (ref >60)
Glucose, Bld: 109 mg/dL — ABNORMAL HIGH (ref 70–99)
Potassium: 3.8 mmol/L (ref 3.5–5.1)
Sodium: 138 mmol/L (ref 135–145)

## 2022-08-06 LAB — SURGICAL PATHOLOGY

## 2022-08-06 LAB — CULTURE, BLOOD (ROUTINE X 2)
Culture: NO GROWTH
Culture: NO GROWTH
Special Requests: ADEQUATE
Special Requests: ADEQUATE

## 2022-08-06 NOTE — Progress Notes (Addendum)
  Progress Note    08/06/2022 7:17 AM 7 Days Post-Op  Subjective:     Vitals:   08/06/22 0422 08/06/22 0500  BP: (!) 146/73   Pulse: 75   Resp: 17   Temp: (!) 97.5 F (36.4 C)   SpO2: 94% 97%   Physical Exam: Cardiac:  regular Lungs:  non labored Incisions:  right AKA intact and well appearing, there is a small separation between two staples on the lateral aspect of incision that is draining SS fluid. No increased drainage on compression but when leg is relaxed it drains pretty steadily. No appreciable collection. Skin looks viable Neurologic: alert  CBC    Component Value Date/Time   WBC 9.2 08/06/2022 0209   RBC 3.00 (L) 08/06/2022 0209   HGB 9.5 (L) 08/06/2022 0209   HGB 14.2 10/08/2021 1220   HCT 29.5 (L) 08/06/2022 0209   HCT 43.3 10/08/2021 1220   PLT 307 08/06/2022 0209   PLT 215 10/08/2021 1220   MCV 98.3 08/06/2022 0209   MCV 95 10/08/2021 1220   MCH 31.7 08/06/2022 0209   MCHC 32.2 08/06/2022 0209   RDW 14.3 08/06/2022 0209   RDW 12.4 10/08/2021 1220   LYMPHSABS 2.5 07/29/2022 0719   LYMPHSABS 3.3 (H) 10/08/2021 1220   MONOABS 1.4 (H) 07/29/2022 0719   EOSABS 0.0 07/29/2022 0719   EOSABS 0.1 10/08/2021 1220   BASOSABS 0.0 07/29/2022 0719   BASOSABS 0.0 10/08/2021 1220    BMET    Component Value Date/Time   NA 138 08/06/2022 0209   NA 139 06/15/2022 1014   K 3.8 08/06/2022 0209   CL 103 08/06/2022 0209   CO2 29 08/06/2022 0209   GLUCOSE 109 (H) 08/06/2022 0209   BUN <5 (L) 08/06/2022 0209   BUN 11 06/15/2022 1014   CREATININE 0.58 (L) 08/06/2022 0209   CREATININE 0.94 04/20/2016 1022   CALCIUM 8.7 (L) 08/06/2022 0209   GFRNONAA >60 08/06/2022 0209   GFRNONAA >89 04/20/2016 1022   GFRAA >60 05/22/2016 0937   GFRAA >89 04/20/2016 1022    INR    Component Value Date/Time   INR 1.2 07/27/2022 1215     Intake/Output Summary (Last 24 hours) at 08/06/2022 0717 Last data filed at 08/06/2022 0500 Gross per 24 hour  Intake 250 ml  Output  1040 ml  Net -790 ml     Assessment/Plan:  62 y.o. male is s/p right AKA 7 Days Post-Op   Right AKA looking okay, staples intact. There is pretty steady SS drainage from lateral aspect between staples. No appreciable collection. Dry gauze dressing applied. No signs of infection Change gauze dressing as needed. Keep stump sock on If he is here over the weekend will continue to monitor drainage He may need closer interval follow up  Pending SNF Jerilynn Mages, PA-C [facility] [phone] 08/06/2022 7:17 AM  I have independently interviewed an examined patient and agree with PA assessment and plan above.  He is draining from the lateral aspect of his AKA but it all appears intact and the skin appears viable and is warm.  Plan will be to have shorter interval follow-up in our office for wound check.  He is okay for discharge from a vascular standpoint.  Pricila Bridge C. Donzetta Matters, MD [facility] [place] Office: [phone] Pager: [phone]

## 2022-08-06 NOTE — TOC Progression Note (Signed)
Transition of Care [facility]) - Progression Note    Patient Details  Name: Gerald [name] MRN: [id number] Date of Birth: 07/21/1960  Transition of Care [facility]) CM/SW [place], [place] Phone Number: 08/06/2022, 5:13 PM  Clinical Narrative:     Authorization for SNF is under med review  w/ Psychologist, occupational-  Received approval for [facility] # [id number]  Thurmond Butts, MSW, LCSW Clinical Social Worker    Expected Discharge Plan: Skilled Nursing Facility Barriers to Discharge: Continued Medical Work up  Expected Discharge Plan and Services In-house Referral: Clinical Social Work     Living arrangements for the past 2 months: Apartment                                       Social Determinants of Health (SDOH) Interventions SDOH Screenings   Food Insecurity: No Food Insecurity (07/29/2022)  Housing: Low Risk  (07/29/2022)  Transportation Needs: No Transportation Needs (07/29/2022)  Utilities: Not At Risk (07/29/2022)  Depression (PHQ2-9): Low Risk  (07/15/2022)  Social Connections: Moderately Integrated (06/15/2022)  Tobacco Use: High Risk (08/01/2022)    Readmission Risk Interventions     No data to display

## 2022-08-06 NOTE — Progress Notes (Cosign Needed)
HD#10 Subjective:   Summary: Gerald [name] is a 62 y.o. 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  Overnight Events: None  Patient is stable today with some retrosternal pain without radiation, dyspnea, presyncope, or persistence. It happened sometime in the night. Sometimes happens with deep breaths and is sharp. Otherwise he has no other complaints and his abdominal pain has not returned.  Objective:  Vital signs in last 24 hours: Vitals:   08/06/22 0028 08/06/22 0422 08/06/22 0500 08/06/22 0700  BP: 104/73 (!) 146/73  137/84  Pulse: 76 75  78  Resp: 15 17    Temp: (!) 97.5 F (36.4 C) (!) 97.5 F (36.4 C)  98 F (36.7 C)  TempSrc: Axillary Oral  Oral  SpO2: 95% 94% 97% 94%  Weight:      Height:       Supplemental O2: Room Air SpO2: 94 % O2 Flow Rate (L/min): 2 L/min   Physical Exam:  Constitutional: Chronically ill-appearing male appears older than stated age, in no acute distress Pulmonary/Chest: Mildly decreased breath sounds in the bases bilaterally  Abdomen: Soft and nontender, mild distention, positive bowel sounds MSK: S/p right AKA with dressing sock in place.  Left lower extremity is warm with Prevnar boot in place. Neuro: Alert and oriented to person, place, and situation with prompting.  Chronic right-sided weakness present on exam with very limited active movement of his right upper extremity and intermittent spasms of hip flexion noted right lower extremity. Skin: mildly decreased skin turgor  BP 137/84 (BP Location: Right Arm)   Pulse 78   Temp 98 F (36.7 C) (Oral)   Resp 17   Ht 6' (1.829  m)   Wt 88.4 kg   SpO2 94%   BMI 26.43 kg/m     Intake/Output Summary (Last 24 hours) at 08/06/2022 1016 Last data filed at 08/06/2022 0500 Gross per 24 hour  Intake 250 ml  Output 1040 ml  Net -790 ml   Net IO Since Admission: -204.96 mL [08/06/22 1016]  Pertinent Labs:    Latest Ref Rng & Units 08/06/2022    2:09 AM 08/04/2022    1:43 AM 08/03/2022    1:52 AM  CBC  WBC 4.0 - 10.5 K/uL 9.2  10.6  12.8   Hemoglobin 13.0 - 17.0 g/dL 9.5  9.7  9.8   Hematocrit 39.0 - 52.0 % 29.5  29.4  29.0   Platelets 150 - 400 K/uL 307  244  223        Latest Ref Rng & Units 08/06/2022    2:09 AM 08/03/2022    8:02 AM 08/01/2022    1:18 AM  CMP  Glucose 70 - 99 mg/dL 109  101  112   BUN 6 - 20 mg/dL '5  10  9   '$ Creatinine 0.61 - 1.24 mg/dL 0.58  0.59  0.79   Sodium 135 - 145 mmol/L 138  138  137   Potassium 3.5 - 5.1 mmol/L 3.8  4.1  3.9   Chloride 98 - 111 mmol/L 103  103  106   CO2 22 - 32 mmol/L '29  27  24   '$ Calcium  8.9 - 10.3 mg/dL 8.7  8.9  9.0     Assessment/Plan:   Principal Problem:   Above knee amputation of right lower extremity (HCC) Active Problems:   Essential hypertension   Spastic hemiplegia affecting dominant side (HCC)   Seizure disorder (HCC)   History of stroke   PAD (peripheral artery disease) (HCC)   Critical limb ischemia of right lower extremity with gangrene (HCC)   Normocytic anemia   Gross hematuria, resolved   Patient Summary: Gerald [name] is a 62 y.o. male with pertinent PMH of prior left ACA infarct with residual right-sided spastic hemiparesis, HTN, seizure disorder, peripheral arterial disease critical limb ischemia of his right lower extremity who presents with about a week of consistent and progressive altered mental status and is admitted for critical limb ischemia of the right lower extremity with gangrene, on hospital day 10.  S/p right AKA 07/30/2022 Right lower extremity critical ischemia with gangrene Right lower extremity  cellulitis Underwent right AKA 2/16. Did have some delirium after surgery which resolved after couple days. Vancomycin and cefepime 2/13 - 2/16.  Doing well today and stable over the last 2-3 days. - Tylenol 1 g 3 times daily  - Oxycodone 5 mg every 6 hours scheduled, current schedule ends 2/24 at 8 AM and we will continue PRN after than - Senokot twice daily and MiraLAX - PT/OT recommend SNF, pending acceptance to [facility] - baclofen 5 mg 3 times daily, will plan to taper as tolerated for muscle spasms as his AKA heals - Incentive spirometer for atelectasis  Abdominal pain and nausea Resolved and stable. He had a bowel movement yesterday.  Will continue to monitor and he has as needed nausea medication as well as a scheduled bowel regimen.  Delirium 2/2 infection, (increased risk due to prior CVA, and hx of seizures), resolved Mental status stable today. He is at still at risk for delirium exacerbation after surgery, having pain, and just being in the hospital. Will continue to monitor.  Microscopic Hematuria Simple cyst right kidney On admission UA there was 6-10 RBCs on UA and CT of the abd/pel w/ contrast showed indeterminate 3 cm exophytic interpolar right renal lesion.  Urinalysis showed calcium oxalate crystals, 11-20 RBCs, and small amount of bilirubin.  Renal ultrasound demonstrated this mass to be a simple cyst.  He did not have any stones seen on CT of the abdomen pelvis with contrast on admission.  He is having some nausea as above but no flank pain and no groin pain aside from the pain he gets in his right leg with spasms.  If he does develop flank pain, persistent groin pain, or pain with urination we will likely get a CT of the abdomen pelvis without contrast to evaluate for stones.  In the meantime we will continue with good hydration and treat his nausea so that he has adequate p.o. intake.  Of note he does have a significant smoking history which puts him at high risk for  bladder cancer but admission CT abdomen pelvis with contrast showed no bladder abnormalities.   Pre-renal AKI, resolved Patient presented with creatinine of 1.30 up from baseline of below 1. Responded well to IV fluids.   Hypokalemia, resolved Potassium 3.3 on admission and stable after supplementation.   Peripheral arterial disease Patient had critical limb ischemia with gangrene of his right lower extremity treated with right AKA as above but also has diminished/absent pulses in his left lower extremity.  - Continue aspirin -  we will ensure he has follow up with vascular outpatient   Prior left ACA CVA with residual right-sided spastic hemiplegia Seizure disorder Stable. - Continue home Depakote 1500 mg daily, Vimpat 50 mg twice daily, and Keppra 1500 mg twice daily - Continue baclofen 5 mg 3 times daily, plan to taper off as tolerated, used for muscles spasms causing pain in post op R AKA, as this heals it could be tapered due to the risk of sedation   Normocytic anemia Patient has developed anemia after presentation and has been largely stable.  Microscopic hematuria and workup as above.  Iron studies did not show any iron deficiency.  Last colonoscopy in 2020 showed 1 small tubular adenoma.   Hypertension - Continue amlodipine 10 mg daily and metoprolol 25 mg twice daily  Diet: Heart Healthy IVF: None, VTE: Enoxaparin  Code: Full Family Update: We will call family with update.   Dispo: SNF placement.   Johny Blamer, DO Internal Medicine Resident PGY-1 Pager: [phone]  Please contact the on call pager after 5 pm and on weekends at [phone].

## 2022-08-06 NOTE — TOC Progression Note (Signed)
Transition of Care [facility]) - Progression Note    Patient Details  Name: Gerald [name] MRN: [id number] Date of Birth: 16-Jun-1960  Transition of Care [facility]) CM/SW [place], [place] Phone Number: 08/06/2022, 11:26 AM  Clinical Narrative:     Insurance authorization remains pending  Expected Discharge Plan: [place] Barriers to Discharge: Continued Medical Work up  Expected Discharge Plan and Services In-house Referral: Clinical Social Work     Living arrangements for the past 2 months: Apartment                                       Social Determinants of Health (SDOH) Interventions SDOH Screenings   Food Insecurity: No Food Insecurity (07/29/2022)  Housing: Low Risk  (07/29/2022)  Transportation Needs: No Transportation Needs (07/29/2022)  Utilities: Not At Risk (07/29/2022)  Depression (PHQ2-9): Low Risk  (07/15/2022)  Social Connections: Moderately Integrated (06/15/2022)  Tobacco Use: High Risk (08/01/2022)    Readmission Risk Interventions     No data to display

## 2022-08-07 DIAGNOSIS — Z89611 Acquired absence of right leg above knee: Secondary | ICD-10-CM | POA: Diagnosis not present

## 2022-08-07 DIAGNOSIS — R7989 Other specified abnormal findings of blood chemistry: Secondary | ICD-10-CM | POA: Diagnosis not present

## 2022-08-07 DIAGNOSIS — R410 Disorientation, unspecified: Secondary | ICD-10-CM | POA: Diagnosis not present

## 2022-08-07 DIAGNOSIS — Z7401 Bed confinement status: Secondary | ICD-10-CM | POA: Diagnosis not present

## 2022-08-07 DIAGNOSIS — M6281 Muscle weakness (generalized): Secondary | ICD-10-CM | POA: Diagnosis not present

## 2022-08-07 DIAGNOSIS — G8111 Spastic hemiplegia affecting right dominant side: Secondary | ICD-10-CM | POA: Diagnosis not present

## 2022-08-07 DIAGNOSIS — Z4781 Encounter for orthopedic aftercare following surgical amputation: Secondary | ICD-10-CM | POA: Diagnosis not present

## 2022-08-07 DIAGNOSIS — G4089 Other seizures: Secondary | ICD-10-CM | POA: Diagnosis not present

## 2022-08-07 DIAGNOSIS — R488 Other symbolic dysfunctions: Secondary | ICD-10-CM | POA: Diagnosis not present

## 2022-08-07 DIAGNOSIS — D649 Anemia, unspecified: Secondary | ICD-10-CM | POA: Diagnosis not present

## 2022-08-07 DIAGNOSIS — G8190 Hemiplegia, unspecified affecting unspecified side: Secondary | ICD-10-CM | POA: Diagnosis not present

## 2022-08-07 DIAGNOSIS — F1010 Alcohol abuse, uncomplicated: Secondary | ICD-10-CM | POA: Diagnosis not present

## 2022-08-07 DIAGNOSIS — L03115 Cellulitis of right lower limb: Secondary | ICD-10-CM | POA: Diagnosis not present

## 2022-08-07 DIAGNOSIS — G40909 Epilepsy, unspecified, not intractable, without status epilepticus: Secondary | ICD-10-CM | POA: Diagnosis not present

## 2022-08-07 DIAGNOSIS — F1410 Cocaine abuse, uncomplicated: Secondary | ICD-10-CM | POA: Diagnosis not present

## 2022-08-07 DIAGNOSIS — S78111D Complete traumatic amputation at level between right hip and knee, subsequent encounter: Secondary | ICD-10-CM | POA: Diagnosis not present

## 2022-08-07 DIAGNOSIS — I639 Cerebral infarction, unspecified: Secondary | ICD-10-CM | POA: Diagnosis not present

## 2022-08-07 DIAGNOSIS — R2681 Unsteadiness on feet: Secondary | ICD-10-CM | POA: Diagnosis not present

## 2022-08-07 DIAGNOSIS — R310 Gross hematuria: Secondary | ICD-10-CM | POA: Diagnosis not present

## 2022-08-07 DIAGNOSIS — I70261 Atherosclerosis of native arteries of extremities with gangrene, right leg: Secondary | ICD-10-CM | POA: Diagnosis not present

## 2022-08-07 DIAGNOSIS — I7389 Other specified peripheral vascular diseases: Secondary | ICD-10-CM | POA: Diagnosis not present

## 2022-08-07 DIAGNOSIS — F17210 Nicotine dependence, cigarettes, uncomplicated: Secondary | ICD-10-CM | POA: Diagnosis not present

## 2022-08-07 MED ORDER — POLYETHYLENE GLYCOL 3350 17 G PO PACK: 17.0000 g | PACK | Freq: Every day | ORAL | 0 refills | Status: AC

## 2022-08-07 MED ORDER — BACLOFEN 5 MG PO TABS: 5.0000 mg | ORAL_TABLET | Freq: Three times a day (TID) | ORAL | 0 refills | Status: AC

## 2022-08-07 MED ORDER — PANTOPRAZOLE SODIUM 40 MG PO TBEC: 40.0000 mg | DELAYED_RELEASE_TABLET | Freq: Every day | ORAL | 0 refills | Status: DC

## 2022-08-07 MED ORDER — SENNOSIDES-DOCUSATE SODIUM 8.6-50 MG PO TABS: 1.0000 | ORAL_TABLET | Freq: Every day | ORAL | 0 refills | Status: AC

## 2022-08-07 MED ORDER — GUAIFENESIN-DM 100-10 MG/5ML PO SYRP: 15.0000 mL | ORAL_SOLUTION | ORAL | 0 refills | Status: AC | PRN

## 2022-08-07 MED ORDER — SENNOSIDES-DOCUSATE SODIUM 8.6-50 MG PO TABS: 1.0000 | ORAL_TABLET | Freq: Every day | ORAL | Status: DC

## 2022-08-07 MED ORDER — ACETAMINOPHEN 500 MG PO TABS: 1000.0000 mg | ORAL_TABLET | Freq: Three times a day (TID) | ORAL | 0 refills | Status: AC

## 2022-08-07 MED ORDER — ENSURE ENLIVE PO LIQD: 237.0000 mL | Freq: Two times a day (BID) | ORAL | 12 refills | Status: AC

## 2022-08-07 MED ORDER — ALUM & MAG HYDROXIDE-SIMETH 200-200-20 MG/5ML PO SUSP: 15.0000 mL | ORAL | 0 refills | Status: AC | PRN

## 2022-08-07 MED ORDER — ONDANSETRON HCL 4 MG/2ML IJ SOLN: 4.0000 mg | Freq: Four times a day (QID) | INTRAMUSCULAR | 0 refills | Status: AC | PRN

## 2022-08-07 MED ORDER — OXYCODONE HCL 5 MG PO TABS: 5.0000 mg | ORAL_TABLET | ORAL | 0 refills | Status: AC

## 2022-08-07 NOTE — TOC Transition Note (Addendum)
Transition of Care [facility]) - CM/SW Discharge Note   Patient Details  Name: Gerald [name] MRN: [id number] Date of Birth: [birth year]  Transition of Care [facility]) CM/SW Contact:  Elliot Gurney [place], [place] Phone Number: 08/07/2022, 11:26 AM   Clinical Narrative:    Patient to discharge to [facility] today. Patient will be going to room 602 and will be transported by [facility]. Nurse to call in report to [phone].  12:31pm  [facility] contacted, transportation arranged to [facility]. Patient and spouse informed.  Mart, LCSW Transition of Care [phone]    Final next level of care: [place] Barriers to Discharge: Continued Medical Work up   Patient Goals and CMS Choice      Discharge Placement                         Discharge Plan and Services Additional resources added to the After Visit Summary for   In-house Referral: Clinical Social Work                                   Social Determinants of Health (SDOH) Interventions SDOH Screenings   Food Insecurity: No Food Insecurity (07/29/2022)  Housing: Low Risk  (07/29/2022)  Transportation Needs: No Transportation Needs (07/29/2022)  Utilities: Not At Risk (07/29/2022)  Depression (PHQ2-9): Low Risk  (07/15/2022)  Social Connections: Moderately Integrated (06/15/2022)  Tobacco Use: High Risk (08/01/2022)     Readmission Risk Interventions     No data to display

## 2022-08-07 NOTE — Discharge Summary (Signed)
Name: Gerald [name] MRN: [id number] DOB: Nov 17, 1960 62 y.o. PCP: Linward Natal, MD  Date of Admission: 07/27/2022 11:16 AM Date of Discharge: 08/07/22 Attending Physician: Dr. Dareen Piano  Discharge Diagnosis: Principal Problem:   Above knee amputation of right lower extremity [facility]) Active Problems:   Essential hypertension   Spastic hemiplegia affecting dominant side ([place])   Seizure disorder ([place])   History of stroke   PAD (peripheral artery disease) ([place])   Critical limb ischemia of right lower extremity with gangrene ([place])   Normocytic anemia   Gross hematuria    Discharge Medications: Allergies as of 08/07/2022   No Known Allergies      Medication List     TAKE these medications    acetaminophen 500 MG tablet Commonly known as: TYLENOL Take 2 tablets (1,000 mg total) by mouth every 8 (eight) hours. What changed:  when to take this reasons to take this   alum & mag hydroxide-simeth 200-200-20 MG/5ML suspension Commonly known as: MAALOX/MYLANTA Take 15-30 mLs by mouth every 2 (two) hours as needed for indigestion.   amLODipine-olmesartan 10-40 MG tablet Commonly known as: AZOR Take 1 tablet by mouth daily.   aspirin EC 81 MG tablet Take 1 tablet (81 mg total) by mouth daily. Swallow whole.   atorvastatin 20 MG tablet Commonly known as: LIPITOR Take 1 tablet (20 mg total) by mouth daily at 6 PM.   Baclofen 5 MG Tabs Take 1 tablet (5 mg total) by mouth 3 (three) times daily for 10 days.   divalproex 500 MG 24 hr tablet Commonly known as: DEPAKOTE ER Take 1,500 mg by mouth at bedtime.   feeding supplement Liqd Take 237 mLs by mouth 2 (two) times daily between meals.   fluticasone 50 MCG/ACT nasal spray Commonly known as: FLONASE Place 1 spray into both nostrils daily.   guaiFENesin-dextromethorphan 100-10 MG/5ML syrup Commonly known as: ROBITUSSIN DM Take 15 mLs by mouth every 4 (four) hours as needed for cough.   lacosamide 50 MG Tabs  tablet Commonly known as: VIMPAT Take 1 tablet (50 mg total) by mouth 2 (two) times daily.   levETIRAcetam 750 MG tablet Commonly known as: KEPPRA Take 2 tablets (1,500 mg total) by mouth 2 (two) times daily.   metoprolol tartrate 25 MG tablet Commonly known as: LOPRESSOR Take 1 tablet (25 mg total) by mouth 2 (two) times daily.   ondansetron 4 MG/2ML Soln injection Commonly known as: ZOFRAN Inject 2 mLs (4 mg total) into the vein every 6 (six) hours as needed for nausea or vomiting.   oxyCODONE 5 MG immediate release tablet Commonly known as: Oxy IR/ROXICODONE Take 1 tablet (5 mg total) by mouth every 4 (four) hours for 5 days.   pantoprazole 40 MG tablet Commonly known as: PROTONIX Take 1 tablet (40 mg total) by mouth daily. Start taking on: August 08, 2022   polyethylene glycol 17 g packet Commonly known as: MIRALAX / GLYCOLAX Take 17 g by mouth daily. Start taking on: August 08, 2022   senna-docusate 8.6-50 MG tablet Commonly known as: Senokot-S Take 1 tablet by mouth at bedtime for 10 days.               Discharge Care Instructions  (From admission, onward)           Start     Ordered   08/07/22 0000  Leave dressing on - Keep it clean, dry, and intact until clinic visit        08/07/22 1139  Disposition and follow-up:   [name] was discharged from [facility] in Stable condition.  At the hospital follow up visit please address:  1.  Follow-up:  a.  Right lower extremity critical ischemia with gangrene status post AKA 2/16-patient needs to follow-up with vascular surgery who performed surgery.  Ensure wound is healing properly.  Ensure no further development of pressure wounds.    b.  Microscopic hematuria/renal cyst.  Patient did have gross/pink urine which has since resolved.  Will need to have repeat UA after hospitalization.   c.  Primary left CVA.  On Depakote, Vimpat, Keppra   d.   Disability/debility-patient has had amputation and difficult to care for self secondary to prior stroke.  Will need to ensure that he is maintaining adequate hydration, nutrition and not developing pressure ulcers.  He is sedentary as well so we will need to ensure that he continues to move his bowels appropriately.  2.  Labs / imaging needed at time of follow-up: UA  3.  Pending labs/ test needing follow-up: none  4.  Medication Changes  Started: Oxycodone 5 mg every 4 hours 5-day course.  Follow-up Appointments:  Follow-up Information     [facility] Follow up in 1 month(s).   Why: The office will call the patient with an appointment Contact information: [address] [place]        Linward Natal, MD. Call.   Contact information: [employer] [id number] [place] by problem list: Patient was seen and evaluated in clinic and was referred over to vascular surgery for critical limb ischemia noted to have gangrenous pressure wound.  Vascular counseled patient that amputation would be necessary but patient opted to wait.  Represented to hospital with altered mentation secondary to infection of the wound.  Started on broad-spectrum antibiotics.  Vascular was reconsulted and performed amputation.  Antibiotics were discontinued.  Mentation showed signs of improvement.  Have some postop pain which was managed with oxycodone, baclofen for muscle spasms as well.  Evaluated by PT OT with recommendation for SNF.  Vascular surgery to follow-up as outpatient.  Microscopic hematuria, simple cyst right kidney.  Patient was noted to have pink/amber-colored urine.  A renal cyst was noted on initial CT scan of the abdomen patient presented.  This cyst was further evaluated with a renal ultrasound which confirmed it as simple and benign.  His DVT prophylaxis was held and urine color  resolved.  Hemoglobin remained stable throughout.  Recommend that he have repeat UA as outpatient.  Prior left CVA with residual right-sided spastic hemiplegia and seizure disorder Patient was continued on Depakote, Vimpat, Keppra.  He was given baclofen for muscle spasms related to this as well as his right AKA.   Discharge Subjective: Patient evaluated bedside with wife present.  Reported moderate pain.  Has had 2 bowel movements yesterday.  No complaints.  Discharge Exam:   BP 135/78 (BP Location: Left Arm)   Pulse 89   Temp 98 F (36.7 C) (Oral)   Resp 18   Ht 6' (1.829 m)   Wt 88.4 kg   SpO2 95%   BMI 26.43 kg/m  Constitutional: Chronically ill-appearing male appears older than stated age, in no acute distress Pulmonary/Chest: Mildly decreased breath sounds in the bases bilaterally  Abdomen: Soft and nontender, mild distention, positive bowel  sounds MSK: S/p right AKA with dressing sock in place.  Left lower extremity is warm with Prevnar boot in place. Neuro: Alert and oriented to person, place, and situation with prompting.  Chronic right-sided weakness present on exam with very limited active movement of his right upper extremity and intermittent spasms of hip flexion noted right lower extremity. Skin: mildly decreased skin turgor  Pertinent Labs, Studies, and Procedures:     Latest Ref Rng & Units 08/06/2022    2:09 AM 08/04/2022    1:43 AM 08/03/2022    1:52 AM  CBC  WBC 4.0 - 10.5 K/uL 9.2  10.6  12.8   Hemoglobin 13.0 - 17.0 g/dL 9.5  9.7  9.8   Hematocrit 39.0 - 52.0 % 29.5  29.4  29.0   Platelets 150 - 400 K/uL 307  244  223        Latest Ref Rng & Units 08/06/2022    2:09 AM 08/03/2022    8:02 AM 08/01/2022    1:18 AM  CMP  Glucose 70 - 99 mg/dL 109  101  112   BUN 6 - 20 mg/dL '5  10  9   '$ Creatinine 0.61 - 1.24 mg/dL 0.58  0.59  0.79   Sodium 135 - 145 mmol/L 138  138  137   Potassium 3.5 - 5.1 mmol/L 3.8  4.1  3.9   Chloride 98 - 111 mmol/L 103  103  106    CO2 22 - 32 mmol/L '29  27  24   '$ Calcium 8.9 - 10.3 mg/dL 8.7  8.9  9.0     CT ABDOMEN PELVIS W CONTRAST  Result Date: 07/27/2022 CLINICAL DATA:  Abdominal pain. EXAM: CT ABDOMEN AND PELVIS WITH CONTRAST TECHNIQUE: Multidetector CT imaging of the abdomen and pelvis was performed using the standard protocol following bolus administration of intravenous contrast. RADIATION DOSE REDUCTION: This exam was performed according to the departmental dose-optimization program which includes automated exposure control, adjustment of the mA and/or kV according to patient size and/or use of iterative reconstruction technique. CONTRAST:  44m OMNIPAQUE IOHEXOL 350 MG/ML SOLN COMPARISON:  None Available. FINDINGS: Lower chest: Patchy areas of subpleural atelectasis but no infiltrates or effusions. The heart is within normal limits in size. No pericardial effusion. Age advanced coronary artery calcifications. Hepatobiliary: No hepatic lesions or intrahepatic biliary dilatation. The gallbladder is grossly normal. No common bile duct dilatation. Pancreas: Mild pancreatic atrophy but no mass, inflammation or ductal dilatation. Spleen: Normal size.  No focal lesions. Adrenals/Urinary Tract: The adrenal glands are unremarkable. Mild nodularity and enlargement suggesting hyperplasia. No discrete nodules. Indeterminate 3 cm exophytic interpolar right renal lesion. It measures 55 Hounsfield units and could be a hemorrhagic cyst or an enhancing solid neoplasm. Ultrasound may be helpful for further evaluation and to discriminate between cyst and solid lesion. No renal, ureteral or bladder calculi. No hydronephrosis. The bladder is unremarkable. Stomach/Bowel: The stomach, duodenum, small bowel and colon are grossly normal. No acute inflammatory process, mass lesions or obstructive findings. The terminal ileum and appendix are normal. There is moderate scattered colonic diverticulosis but no findings for acute diverticulitis.  Vascular/Lymphatic: Age advanced atherosclerotic calcification involving the aorta and iliac arteries but no aneurysm or dissection. The major venous structures are patent. No mesenteric or retroperitoneal mass or adenopathy. Reproductive: The prostate gland and seminal vesicles are unremarkable. Other: No pelvic mass or adenopathy. No free pelvic fluid collections. No inguinal mass or adenopathy. No abdominal wall hernia or subcutaneous lesions. Musculoskeletal:  Thoracolumbar scoliosis but no acute bony findings or worrisome bone lesions. IMPRESSION: 1. No acute abdominal/pelvic findings or adenopathy. 2. Indeterminate 3 cm exophytic interpolar right renal lesion. This could be a hemorrhagic cyst or an enhancing solid neoplasm. Ultrasound may be helpful for further evaluation and to discriminate between cyst and solid lesion. 3. Age advanced vascular disease. Aortic Atherosclerosis (ICD10-I70.0). Electronically Signed   By: Marijo Sanes M.D.   On: 07/27/2022 14:46   CT Head Wo Contrast  Result Date: 07/27/2022 CLINICAL DATA:  Delirium EXAM: CT HEAD WITHOUT CONTRAST TECHNIQUE: Contiguous axial images were obtained from the base of the skull through the vertex without intravenous contrast. RADIATION DOSE REDUCTION: This exam was performed according to the departmental dose-optimization program which includes automated exposure control, adjustment of the mA and/or kV according to patient size and/or use of iterative reconstruction technique. COMPARISON:  MRI brain dated 05/27/2022 FINDINGS: Brain: No evidence of acute infarction, hemorrhage, hydrocephalus, extra-axial collection or mass lesion/mass effect. Encephalomalacic changes related to old left ACA distribution infarct. Subcortical white matter and periventricular small vessel ischemic changes. Vascular: No hyperdense vessel or unexpected calcification. Skull: Normal. Negative for fracture or focal lesion. Sinuses/Orbits: Chronic opacification of the right  maxillary sinus. Visualized paranasal sinuses and mastoid air cells are otherwise clear. Other: None. IMPRESSION: No acute intracranial abnormality. Old left ACA distribution infarct. Small vessel ischemic changes. Electronically Signed   By: Julian Hy M.D.   On: 07/27/2022 14:37   DG Chest Portable 1 View  Result Date: 07/27/2022 CLINICAL DATA:  Altered mental status, shortness of breath x1 month EXAM: PORTABLE CHEST 1 VIEW COMPARISON:  05/27/2022 FINDINGS: Patient is rotated. Lungs are clear.  No pleural effusion or pneumothorax. The heart is top-normal in size. IMPRESSION: No evidence of acute cardiopulmonary disease. Electronically Signed   By: Julian Hy M.D.   On: 07/27/2022 12:47     Discharge Instructions: Discharge Instructions     Call MD for:  difficulty breathing, headache or visual disturbances   Complete by: As directed    Call MD for:  extreme fatigue   Complete by: As directed    Call MD for:  hives   Complete by: As directed    Call MD for:  persistant dizziness or light-headedness   Complete by: As directed    Call MD for:  persistant nausea and vomiting   Complete by: As directed    Call MD for:  redness, tenderness, or signs of infection (pain, swelling, redness, odor or green/yellow discharge around incision site)   Complete by: As directed    Call MD for:  severe uncontrolled pain   Complete by: As directed    Call MD for:  temperature >100.4   Complete by: As directed    Diet - low sodium heart healthy   Complete by: As directed    Increase activity slowly   Complete by: As directed    Leave dressing on - Keep it clean, dry, and intact until clinic visit   Complete by: As directed        Signed: Delene Ruffini, MD 08/07/2022, 11:49 AM   Pager: [phone]

## 2022-08-07 NOTE — Progress Notes (Signed)
HD#11 Subjective:   Summary: Gerald [name] is a 62 y.o. 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  Overnight Events: None  Patient evaluated bedside.  Wife also at bedside.  He does report moderate pain.  No other complaints.  Has had 2 bowel movements yesterday.  Objective:  Vital signs in last 24 hours: Vitals:   08/06/22 1941 08/06/22 2348 08/07/22 0340 08/07/22 0916  BP: 130/83 130/71 (!) 141/80 135/78  Pulse: 100 72 80 89  Resp: '20 20 20 18  '$ Temp: 98.1 F (36.7 C) 97.8 F (36.6 C) 98 F (36.7 C) 98 F (36.7 C)  TempSrc: Oral Oral Oral Oral  SpO2: 93% 92% 97% 95%  Weight:      Height:        Physical Exam:  Constitutional: Chronically ill-appearing male appears older than stated age, in no acute distress Pulmonary/Chest: Mildly decreased breath sounds in the bases bilaterally  Abdomen: Soft and nontender, mild distention, positive bowel sounds MSK: S/p right AKA with dressing sock in place.  Left lower extremity is warm with Prevnar boot in place. Neuro: Alert and oriented to person, place, and situation with prompting.  Chronic right-sided weakness present on exam with very limited active movement of his right upper extremity and intermittent spasms of hip flexion noted right lower extremity. Skin: mildly decreased skin turgor  Assessment/Plan:   Principal Problem:   Above knee amputation of right lower extremity ([facility]) Active Problems:   Essential hypertension   Spastic hemiplegia affecting dominant side ([facility])   Seizure disorder ([facility])   History of stroke   PAD (peripheral artery disease) ([facility])    Critical limb ischemia of right lower extremity with gangrene ([facility])   Normocytic anemia   Gross hematuria   Patient Summary: Gerald [name] is a 62 y.o. male with pertinent PMH of prior left ACA infarct with residual right-sided spastic hemiparesis, HTN, seizure disorder, peripheral arterial disease critical limb ischemia of his right lower extremity who presents with about a week of consistent and progressive altered mental status and is admitted for critical limb ischemia of the right lower extremity with gangrene, on hospital day 11.  S/p right AKA 07/30/2022 Right lower extremity critical ischemia with gangrene Right lower extremity cellulitis Status post AKA 2/16.  Patient has been stable and is back to his baseline mentation.  Still having some pain so we will continue with as needed morphine, can do baclofen as needed for muscle spasms as well.  While this patient is pending SNF it will be important to prevent any pressure ulcers or atelectasis from developing.  SNF auth still pending. - Tylenol 1 g 3 times daily  - Senokot twice daily and MiraLAX - PT/OT recommend SNF, pending acceptance to [facility] for atelectasis  Microscopic Hematuria Simple cyst right kidney Hemoglobin has been stable.  No signs of overt bleeding.  This will need to be followed up as an outpatient to ensure resolution.    Peripheral arterial disease - Continue aspirin - we will ensure he has follow up with vascular outpatient   Prior left ACA CVA with residual right-sided spastic hemiplegia Seizure disorder Stable. - Continue  home Depakote 1500 mg daily, Vimpat 50 mg twice daily, and Keppra 1500 mg twice daily - Continue baclofen 5 mg 3 times daily, plan to taper off as tolerated, used for muscles spasms causing pain in post op R AKA, as this heals it could be tapered due to the risk of sedation   Hypertension - Continue amlodipine 10 mg daily and metoprolol 25 mg twice  daily  Delirium 2/2 infection, (increased risk due to prior CVA, and hx of seizures) Resolved Pre-renal AKI, resolved Hypokalemia, resolved  Diet: Heart Healthy IVF: None, VTE: Enoxaparin  Code: Full Family Update: We will call family with update Dispo: SNF placement.  Delene Ruffini, MD

## 2022-08-07 NOTE — TOC Progression Note (Addendum)
Transition of Care [facility]) - Progression Note    Patient Details  Name: Gerald [name] MRN: [id number] Date of Birth: 09/09/1960  Transition of [facility]) CM/SW Contact  Mally Gavina [place], [place] Phone Number: 08/07/2022, 11:05 AM  Clinical Narrative:    Phone call from [facility] -SNF authorization  approved for 10 days. Authorization number [id number]. Patient has 5 days to transition to facility before authorization would need to be re-submitted. Facility notified of authorization, patient able to transition today if medically ready.   [place], LCSW Transition of Care [phone]       Expected Discharge Plan: [place] Barriers to Discharge: Continued Medical Work up  Expected Discharge Plan and Services In-house Referral: Clinical Social Work     Living arrangements for the past 2 months: Apartment                                       Social Determinants of Health (SDOH) Interventions SDOH Screenings   Food Insecurity: No Food Insecurity (07/29/2022)  Housing: Low Risk  (07/29/2022)  Transportation Needs: No Transportation Needs (07/29/2022)  Utilities: Not At Risk (07/29/2022)  Depression (PHQ2-9): Low Risk  (07/15/2022)  Social Connections: Moderately Integrated (06/15/2022)  Tobacco Use: High Risk (08/01/2022)    Readmission Risk Interventions     No data to display

## 2022-08-07 NOTE — TOC Progression Note (Signed)
Transition of Care [facility]) - Progression Note    Patient Details  Name: Gerald [name] MRN: [id number] Date of Birth: June 05, 1961  Transition [facility]) CM/SW Contact  Leanthony Rhett, [place],  Phone Number: 08/07/2022, 9:57 AM  Clinical Narrative:    Phone call to [facility] to follow up on authorization for skilled nursing care. Per Shirlee More is still pending. She will call once authorization is received.  [place], LCSW Transition of Care [phone]    Expected Discharge Plan: [place] Barriers to Discharge: Continued Medical Work up  Expected Discharge Plan and Services In-house Referral: Clinical Social Work     Living arrangements for the past 2 months: Apartment                                       Social Determinants of Health (SDOH) Interventions SDOH Screenings   Food Insecurity: No Food Insecurity (07/29/2022)  Housing: Low Risk  (07/29/2022)  Transportation Needs: No Transportation Needs (07/29/2022)  Utilities: Not At Risk (07/29/2022)  Depression (PHQ2-9): Low Risk  (07/15/2022)  Social Connections: Moderately Integrated (06/15/2022)  Tobacco Use: High Risk (08/01/2022)    Readmission Risk Interventions     No data to display

## 2022-08-09 DIAGNOSIS — G4089 Other seizures: Secondary | ICD-10-CM | POA: Diagnosis not present

## 2022-08-09 DIAGNOSIS — F1010 Alcohol abuse, uncomplicated: Secondary | ICD-10-CM | POA: Diagnosis not present

## 2022-08-09 DIAGNOSIS — I7389 Other specified peripheral vascular diseases: Secondary | ICD-10-CM | POA: Diagnosis not present

## 2022-08-09 DIAGNOSIS — G8111 Spastic hemiplegia affecting right dominant side: Secondary | ICD-10-CM | POA: Diagnosis not present

## 2022-08-09 DIAGNOSIS — F1410 Cocaine abuse, uncomplicated: Secondary | ICD-10-CM | POA: Diagnosis not present

## 2022-08-09 DIAGNOSIS — D649 Anemia, unspecified: Secondary | ICD-10-CM | POA: Diagnosis not present

## 2022-08-09 DIAGNOSIS — M6281 Muscle weakness (generalized): Secondary | ICD-10-CM | POA: Diagnosis not present

## 2022-08-09 DIAGNOSIS — I639 Cerebral infarction, unspecified: Secondary | ICD-10-CM | POA: Diagnosis not present

## 2022-08-09 DIAGNOSIS — R310 Gross hematuria: Secondary | ICD-10-CM | POA: Diagnosis not present

## 2022-08-09 DIAGNOSIS — Z89611 Acquired absence of right leg above knee: Secondary | ICD-10-CM | POA: Diagnosis not present

## 2022-08-10 DIAGNOSIS — S78111D Complete traumatic amputation at level between right hip and knee, subsequent encounter: Secondary | ICD-10-CM | POA: Diagnosis not present

## 2022-08-10 DIAGNOSIS — I639 Cerebral infarction, unspecified: Secondary | ICD-10-CM | POA: Diagnosis not present

## 2022-08-10 DIAGNOSIS — G8111 Spastic hemiplegia affecting right dominant side: Secondary | ICD-10-CM | POA: Diagnosis not present

## 2022-08-10 DIAGNOSIS — I7389 Other specified peripheral vascular diseases: Secondary | ICD-10-CM | POA: Diagnosis not present

## 2022-08-10 DIAGNOSIS — R310 Gross hematuria: Secondary | ICD-10-CM | POA: Diagnosis not present

## 2022-08-10 DIAGNOSIS — D649 Anemia, unspecified: Secondary | ICD-10-CM | POA: Diagnosis not present

## 2022-08-10 DIAGNOSIS — Z89611 Acquired absence of right leg above knee: Secondary | ICD-10-CM | POA: Diagnosis not present

## 2022-08-10 DIAGNOSIS — M6281 Muscle weakness (generalized): Secondary | ICD-10-CM | POA: Diagnosis not present

## 2022-08-10 DIAGNOSIS — R2681 Unsteadiness on feet: Secondary | ICD-10-CM | POA: Diagnosis not present

## 2022-08-10 DIAGNOSIS — G40909 Epilepsy, unspecified, not intractable, without status epilepticus: Secondary | ICD-10-CM | POA: Diagnosis not present

## 2022-08-10 DIAGNOSIS — F1010 Alcohol abuse, uncomplicated: Secondary | ICD-10-CM | POA: Diagnosis not present

## 2022-08-10 DIAGNOSIS — G4089 Other seizures: Secondary | ICD-10-CM | POA: Diagnosis not present

## 2022-08-10 DIAGNOSIS — F1410 Cocaine abuse, uncomplicated: Secondary | ICD-10-CM | POA: Diagnosis not present

## 2022-08-11 DIAGNOSIS — M6281 Muscle weakness (generalized): Secondary | ICD-10-CM | POA: Diagnosis not present

## 2022-08-11 DIAGNOSIS — G4089 Other seizures: Secondary | ICD-10-CM | POA: Diagnosis not present

## 2022-08-11 DIAGNOSIS — I639 Cerebral infarction, unspecified: Secondary | ICD-10-CM | POA: Diagnosis not present

## 2022-08-11 DIAGNOSIS — Z89611 Acquired absence of right leg above knee: Secondary | ICD-10-CM | POA: Diagnosis not present

## 2022-08-11 DIAGNOSIS — R310 Gross hematuria: Secondary | ICD-10-CM | POA: Diagnosis not present

## 2022-08-11 DIAGNOSIS — F1010 Alcohol abuse, uncomplicated: Secondary | ICD-10-CM | POA: Diagnosis not present

## 2022-08-11 DIAGNOSIS — D649 Anemia, unspecified: Secondary | ICD-10-CM | POA: Diagnosis not present

## 2022-08-11 DIAGNOSIS — F1410 Cocaine abuse, uncomplicated: Secondary | ICD-10-CM | POA: Diagnosis not present

## 2022-08-11 DIAGNOSIS — I7389 Other specified peripheral vascular diseases: Secondary | ICD-10-CM | POA: Diagnosis not present

## 2022-08-11 DIAGNOSIS — G8111 Spastic hemiplegia affecting right dominant side: Secondary | ICD-10-CM | POA: Diagnosis not present

## 2022-08-12 DIAGNOSIS — M6281 Muscle weakness (generalized): Secondary | ICD-10-CM | POA: Diagnosis not present

## 2022-08-12 DIAGNOSIS — D649 Anemia, unspecified: Secondary | ICD-10-CM | POA: Diagnosis not present

## 2022-08-12 DIAGNOSIS — I7389 Other specified peripheral vascular diseases: Secondary | ICD-10-CM | POA: Diagnosis not present

## 2022-08-12 DIAGNOSIS — I639 Cerebral infarction, unspecified: Secondary | ICD-10-CM | POA: Diagnosis not present

## 2022-08-12 DIAGNOSIS — R310 Gross hematuria: Secondary | ICD-10-CM | POA: Diagnosis not present

## 2022-08-12 DIAGNOSIS — G4089 Other seizures: Secondary | ICD-10-CM | POA: Diagnosis not present

## 2022-08-12 DIAGNOSIS — F1410 Cocaine abuse, uncomplicated: Secondary | ICD-10-CM | POA: Diagnosis not present

## 2022-08-12 DIAGNOSIS — G8111 Spastic hemiplegia affecting right dominant side: Secondary | ICD-10-CM | POA: Diagnosis not present

## 2022-08-12 DIAGNOSIS — F1010 Alcohol abuse, uncomplicated: Secondary | ICD-10-CM | POA: Diagnosis not present

## 2022-08-12 DIAGNOSIS — Z89611 Acquired absence of right leg above knee: Secondary | ICD-10-CM | POA: Diagnosis not present

## 2022-08-16 DIAGNOSIS — I639 Cerebral infarction, unspecified: Secondary | ICD-10-CM | POA: Diagnosis not present

## 2022-08-16 DIAGNOSIS — G4089 Other seizures: Secondary | ICD-10-CM | POA: Diagnosis not present

## 2022-08-16 DIAGNOSIS — Z89611 Acquired absence of right leg above knee: Secondary | ICD-10-CM | POA: Diagnosis not present

## 2022-08-16 DIAGNOSIS — M6281 Muscle weakness (generalized): Secondary | ICD-10-CM | POA: Diagnosis not present

## 2022-08-16 DIAGNOSIS — F1410 Cocaine abuse, uncomplicated: Secondary | ICD-10-CM | POA: Diagnosis not present

## 2022-08-16 DIAGNOSIS — F1010 Alcohol abuse, uncomplicated: Secondary | ICD-10-CM | POA: Diagnosis not present

## 2022-08-16 DIAGNOSIS — I7389 Other specified peripheral vascular diseases: Secondary | ICD-10-CM | POA: Diagnosis not present

## 2022-08-16 DIAGNOSIS — G8111 Spastic hemiplegia affecting right dominant side: Secondary | ICD-10-CM | POA: Diagnosis not present

## 2022-08-16 DIAGNOSIS — R310 Gross hematuria: Secondary | ICD-10-CM | POA: Diagnosis not present

## 2022-08-16 DIAGNOSIS — D649 Anemia, unspecified: Secondary | ICD-10-CM | POA: Diagnosis not present

## 2022-08-17 DIAGNOSIS — M6281 Muscle weakness (generalized): Secondary | ICD-10-CM | POA: Diagnosis not present

## 2022-08-17 DIAGNOSIS — G8111 Spastic hemiplegia affecting right dominant side: Secondary | ICD-10-CM | POA: Diagnosis not present

## 2022-08-17 DIAGNOSIS — R2681 Unsteadiness on feet: Secondary | ICD-10-CM | POA: Diagnosis not present

## 2022-08-17 DIAGNOSIS — S78111D Complete traumatic amputation at level between right hip and knee, subsequent encounter: Secondary | ICD-10-CM | POA: Diagnosis not present

## 2022-08-17 DIAGNOSIS — G40909 Epilepsy, unspecified, not intractable, without status epilepticus: Secondary | ICD-10-CM | POA: Diagnosis not present

## 2022-08-18 ENCOUNTER — Ambulatory Visit: Payer: PPO

## 2022-08-18 ENCOUNTER — Encounter: Payer: PPO | Admitting: Student

## 2022-08-18 DIAGNOSIS — R310 Gross hematuria: Secondary | ICD-10-CM | POA: Diagnosis not present

## 2022-08-18 DIAGNOSIS — I7389 Other specified peripheral vascular diseases: Secondary | ICD-10-CM | POA: Diagnosis not present

## 2022-08-18 DIAGNOSIS — M6281 Muscle weakness (generalized): Secondary | ICD-10-CM | POA: Diagnosis not present

## 2022-08-18 DIAGNOSIS — F1410 Cocaine abuse, uncomplicated: Secondary | ICD-10-CM | POA: Diagnosis not present

## 2022-08-18 DIAGNOSIS — Z89611 Acquired absence of right leg above knee: Secondary | ICD-10-CM | POA: Diagnosis not present

## 2022-08-18 DIAGNOSIS — D649 Anemia, unspecified: Secondary | ICD-10-CM | POA: Diagnosis not present

## 2022-08-18 DIAGNOSIS — G8111 Spastic hemiplegia affecting right dominant side: Secondary | ICD-10-CM | POA: Diagnosis not present

## 2022-08-18 DIAGNOSIS — F1010 Alcohol abuse, uncomplicated: Secondary | ICD-10-CM | POA: Diagnosis not present

## 2022-08-18 DIAGNOSIS — I639 Cerebral infarction, unspecified: Secondary | ICD-10-CM | POA: Diagnosis not present

## 2022-08-18 DIAGNOSIS — G4089 Other seizures: Secondary | ICD-10-CM | POA: Diagnosis not present

## 2022-08-20 ENCOUNTER — Encounter: Payer: PPO | Admitting: Physical Medicine & Rehabilitation

## 2022-08-20 DIAGNOSIS — S78111D Complete traumatic amputation at level between right hip and knee, subsequent encounter: Secondary | ICD-10-CM | POA: Diagnosis not present

## 2022-08-20 DIAGNOSIS — L03115 Cellulitis of right lower limb: Secondary | ICD-10-CM | POA: Diagnosis not present

## 2022-08-20 DIAGNOSIS — R310 Gross hematuria: Secondary | ICD-10-CM | POA: Diagnosis not present

## 2022-08-20 DIAGNOSIS — I639 Cerebral infarction, unspecified: Secondary | ICD-10-CM | POA: Diagnosis not present

## 2022-08-20 DIAGNOSIS — F1410 Cocaine abuse, uncomplicated: Secondary | ICD-10-CM | POA: Diagnosis not present

## 2022-08-20 DIAGNOSIS — G40909 Epilepsy, unspecified, not intractable, without status epilepticus: Secondary | ICD-10-CM | POA: Diagnosis not present

## 2022-08-20 DIAGNOSIS — F1010 Alcohol abuse, uncomplicated: Secondary | ICD-10-CM | POA: Diagnosis not present

## 2022-08-20 DIAGNOSIS — G4089 Other seizures: Secondary | ICD-10-CM | POA: Diagnosis not present

## 2022-08-20 DIAGNOSIS — Z89611 Acquired absence of right leg above knee: Secondary | ICD-10-CM | POA: Diagnosis not present

## 2022-08-20 DIAGNOSIS — D649 Anemia, unspecified: Secondary | ICD-10-CM | POA: Diagnosis not present

## 2022-08-20 DIAGNOSIS — M6281 Muscle weakness (generalized): Secondary | ICD-10-CM | POA: Diagnosis not present

## 2022-08-20 DIAGNOSIS — R2681 Unsteadiness on feet: Secondary | ICD-10-CM | POA: Diagnosis not present

## 2022-08-20 DIAGNOSIS — G8111 Spastic hemiplegia affecting right dominant side: Secondary | ICD-10-CM | POA: Diagnosis not present

## 2022-08-20 DIAGNOSIS — I7389 Other specified peripheral vascular diseases: Secondary | ICD-10-CM | POA: Diagnosis not present

## 2022-08-24 DIAGNOSIS — S78111D Complete traumatic amputation at level between right hip and knee, subsequent encounter: Secondary | ICD-10-CM | POA: Diagnosis not present

## 2022-08-24 DIAGNOSIS — G8111 Spastic hemiplegia affecting right dominant side: Secondary | ICD-10-CM | POA: Diagnosis not present

## 2022-08-24 DIAGNOSIS — M6281 Muscle weakness (generalized): Secondary | ICD-10-CM | POA: Diagnosis not present

## 2022-08-24 DIAGNOSIS — R2681 Unsteadiness on feet: Secondary | ICD-10-CM | POA: Diagnosis not present

## 2022-08-24 DIAGNOSIS — G40909 Epilepsy, unspecified, not intractable, without status epilepticus: Secondary | ICD-10-CM | POA: Diagnosis not present

## 2022-08-27 DIAGNOSIS — G8111 Spastic hemiplegia affecting right dominant side: Secondary | ICD-10-CM | POA: Diagnosis not present

## 2022-08-27 DIAGNOSIS — G4089 Other seizures: Secondary | ICD-10-CM | POA: Diagnosis not present

## 2022-08-27 DIAGNOSIS — D649 Anemia, unspecified: Secondary | ICD-10-CM | POA: Diagnosis not present

## 2022-08-27 DIAGNOSIS — F1410 Cocaine abuse, uncomplicated: Secondary | ICD-10-CM | POA: Diagnosis not present

## 2022-08-27 DIAGNOSIS — Z89611 Acquired absence of right leg above knee: Secondary | ICD-10-CM | POA: Diagnosis not present

## 2022-08-27 DIAGNOSIS — I639 Cerebral infarction, unspecified: Secondary | ICD-10-CM | POA: Diagnosis not present

## 2022-08-27 DIAGNOSIS — L03115 Cellulitis of right lower limb: Secondary | ICD-10-CM | POA: Diagnosis not present

## 2022-08-27 DIAGNOSIS — F1010 Alcohol abuse, uncomplicated: Secondary | ICD-10-CM | POA: Diagnosis not present

## 2022-08-27 DIAGNOSIS — M6281 Muscle weakness (generalized): Secondary | ICD-10-CM | POA: Diagnosis not present

## 2022-08-27 DIAGNOSIS — I7389 Other specified peripheral vascular diseases: Secondary | ICD-10-CM | POA: Diagnosis not present

## 2022-08-27 DIAGNOSIS — R310 Gross hematuria: Secondary | ICD-10-CM | POA: Diagnosis not present

## 2022-09-01 ENCOUNTER — Ambulatory Visit (INDEPENDENT_AMBULATORY_CARE_PROVIDER_SITE_OTHER): Payer: PPO | Admitting: Physician Assistant

## 2022-09-01 VITALS — BP 137/86 | HR 70 | Temp 97.6°F

## 2022-09-01 DIAGNOSIS — Z89611 Acquired absence of right leg above knee: Secondary | ICD-10-CM

## 2022-09-01 DIAGNOSIS — I70221 Atherosclerosis of native arteries of extremities with rest pain, right leg: Secondary | ICD-10-CM

## 2022-09-01 NOTE — Progress Notes (Signed)
  POST OPERATIVE OFFICE NOTE    CC:  F/u for surgery  HPI:  This is a 62 y.o. male who is s/p right above-the-knee amputation by Dr. Trula Slade due to ischemic right foot.  Patient believes the incision is healing well.  He denies any fevers, chills, nausea/vomiting.  He is accompanied today by his wife.  No Known Allergies  Current Outpatient Medications  Medication Sig Dispense Refill   acetaminophen (TYLENOL) 500 MG tablet Take 2 tablets (1,000 mg total) by mouth every 8 (eight) hours. 30 tablet 0   alum & mag hydroxide-simeth (MAALOX/MYLANTA) 200-200-20 MG/5ML suspension Take 15-30 mLs by mouth every 2 (two) hours as needed for indigestion. 355 mL 0   amLODipine-olmesartan (AZOR) 10-40 MG tablet Take 1 tablet by mouth daily. 90 tablet 2   aspirin EC 81 MG tablet Take 1 tablet (81 mg total) by mouth daily. Swallow whole. 30 tablet 12   atorvastatin (LIPITOR) 20 MG tablet Take 1 tablet (20 mg total) by mouth daily at 6 PM. 30 tablet 3   divalproex (DEPAKOTE ER) 500 MG 24 hr tablet Take 1,500 mg by mouth at bedtime.     feeding supplement (ENSURE ENLIVE / ENSURE PLUS) LIQD Take 237 mLs by mouth 2 (two) times daily between meals. 237 mL 12   fluticasone (FLONASE) 50 MCG/ACT nasal spray Place 1 spray into both nostrils daily. 11.1 mL 2   guaiFENesin-dextromethorphan (ROBITUSSIN DM) 100-10 MG/5ML syrup Take 15 mLs by mouth every 4 (four) hours as needed for cough. 118 mL 0   lacosamide (VIMPAT) 50 MG TABS tablet Take 1 tablet (50 mg total) by mouth 2 (two) times daily. 60 tablet 0   levETIRAcetam (KEPPRA) 750 MG tablet Take 2 tablets (1,500 mg total) by mouth 2 (two) times daily. 360 tablet 3   metoprolol tartrate (LOPRESSOR) 25 MG tablet Take 1 tablet (25 mg total) by mouth 2 (two) times daily. 60 tablet 0   ondansetron (ZOFRAN) 4 MG/2ML SOLN injection Inject 2 mLs (4 mg total) into the vein every 6 (six) hours as needed for nausea or vomiting. 2 mL 0   pantoprazole (PROTONIX) 40 MG tablet Take  1 tablet (40 mg total) by mouth daily. 30 tablet 0   polyethylene glycol (MIRALAX / GLYCOLAX) 17 g packet Take 17 g by mouth daily. 14 each 0   No current facility-administered medications for this visit.     ROS:  See HPI  Physical Exam:  Vitals:   09/01/22 1253  BP: 137/86  Pulse: 70  Temp: 97.6 F (36.4 C)  TempSrc: Temporal  SpO2: 97%    Incision: Overall incision appears to be healing well with viable skin edges; some areas of dry eschar; no sign of infection, no areas of fluctuance Neuro: A&O; contracted right arm  Assessment/Plan:  This is a 62 y.o. male who is s/p: Right above-the-knee amputation  -Every other staple was removed during today's visit.  There was some serosanguineous drainage with removal of staples at the medial aspect of the incision.  This was dressed with a dry dressing and Ace wrap.  He can cleanse the incision with soap and water daily and then pat dry.  He will follow-up in another week or 2 for removal of the remainder of staples.   Dagoberto Ligas, PA-C [facility] [phone]  Clinic MD:  Donzetta Matters

## 2022-09-02 DIAGNOSIS — I10 Essential (primary) hypertension: Secondary | ICD-10-CM | POA: Diagnosis not present

## 2022-09-02 DIAGNOSIS — D649 Anemia, unspecified: Secondary | ICD-10-CM | POA: Diagnosis not present

## 2022-09-02 DIAGNOSIS — Z89611 Acquired absence of right leg above knee: Secondary | ICD-10-CM | POA: Diagnosis not present

## 2022-09-02 DIAGNOSIS — N281 Cyst of kidney, acquired: Secondary | ICD-10-CM | POA: Diagnosis not present

## 2022-09-02 DIAGNOSIS — Z7982 Long term (current) use of aspirin: Secondary | ICD-10-CM | POA: Diagnosis not present

## 2022-09-02 DIAGNOSIS — I69351 Hemiplegia and hemiparesis following cerebral infarction affecting right dominant side: Secondary | ICD-10-CM | POA: Diagnosis not present

## 2022-09-02 DIAGNOSIS — F1410 Cocaine abuse, uncomplicated: Secondary | ICD-10-CM | POA: Diagnosis not present

## 2022-09-02 DIAGNOSIS — Z4781 Encounter for orthopedic aftercare following surgical amputation: Secondary | ICD-10-CM | POA: Diagnosis not present

## 2022-09-02 DIAGNOSIS — S78111D Complete traumatic amputation at level between right hip and knee, subsequent encounter: Secondary | ICD-10-CM | POA: Diagnosis not present

## 2022-09-02 DIAGNOSIS — F17210 Nicotine dependence, cigarettes, uncomplicated: Secondary | ICD-10-CM | POA: Diagnosis not present

## 2022-09-02 DIAGNOSIS — Z79899 Other long term (current) drug therapy: Secondary | ICD-10-CM | POA: Diagnosis not present

## 2022-09-02 DIAGNOSIS — G4089 Other seizures: Secondary | ICD-10-CM | POA: Diagnosis not present

## 2022-09-02 DIAGNOSIS — Z9181 History of falling: Secondary | ICD-10-CM | POA: Diagnosis not present

## 2022-09-02 DIAGNOSIS — I70221 Atherosclerosis of native arteries of extremities with rest pain, right leg: Secondary | ICD-10-CM | POA: Diagnosis not present

## 2022-09-02 DIAGNOSIS — F1010 Alcohol abuse, uncomplicated: Secondary | ICD-10-CM | POA: Diagnosis not present

## 2022-09-06 ENCOUNTER — Telehealth: Payer: Self-pay

## 2022-09-06 DIAGNOSIS — G40909 Epilepsy, unspecified, not intractable, without status epilepticus: Secondary | ICD-10-CM | POA: Diagnosis not present

## 2022-09-06 DIAGNOSIS — I10 Essential (primary) hypertension: Secondary | ICD-10-CM | POA: Diagnosis not present

## 2022-09-06 NOTE — Telephone Encounter (Signed)
Cara RN with [facility] called to obtain verbal orders to continue [facility].  Reviewed pt's chart, returned call for clarification, two identifiers used. Gave verbal orders to continue [facility] until pt's next appt on 4/3 and the need would be revisited at that time.

## 2022-09-07 ENCOUNTER — Inpatient Hospital Stay (HOSPITAL_COMMUNITY)
Admission: AD | Admit: 2022-09-07 | Discharge: 2022-09-10 | DRG: 092 | Disposition: A | Discharge status: Home Health | Payer: PPO | Source: Ambulatory Visit | Attending: Internal Medicine | Admitting: Internal Medicine

## 2022-09-07 ENCOUNTER — Encounter (HOSPITAL_COMMUNITY): Payer: Self-pay

## 2022-09-07 ENCOUNTER — Telehealth: Payer: Self-pay | Admitting: Student

## 2022-09-07 ENCOUNTER — Encounter (HOSPITAL_COMMUNITY): Payer: Self-pay | Admitting: Internal Medicine

## 2022-09-07 ENCOUNTER — Observation Stay (HOSPITAL_COMMUNITY): Payer: PPO

## 2022-09-07 ENCOUNTER — Ambulatory Visit (INDEPENDENT_AMBULATORY_CARE_PROVIDER_SITE_OTHER): Payer: PPO

## 2022-09-07 ENCOUNTER — Other Ambulatory Visit: Payer: Self-pay

## 2022-09-07 VITALS — BP 113/77 | HR 82 | Temp 97.4°F | Ht 72.0 in

## 2022-09-07 DIAGNOSIS — G9341 Metabolic encephalopathy: Secondary | ICD-10-CM | POA: Diagnosis not present

## 2022-09-07 DIAGNOSIS — Y92009 Unspecified place in unspecified non-institutional (private) residence as the place of occurrence of the external cause: Secondary | ICD-10-CM | POA: Diagnosis not present

## 2022-09-07 DIAGNOSIS — F17210 Nicotine dependence, cigarettes, uncomplicated: Secondary | ICD-10-CM | POA: Diagnosis present

## 2022-09-07 DIAGNOSIS — I69322 Dysarthria following cerebral infarction: Secondary | ICD-10-CM | POA: Diagnosis not present

## 2022-09-07 DIAGNOSIS — E860 Dehydration: Secondary | ICD-10-CM | POA: Diagnosis present

## 2022-09-07 DIAGNOSIS — Z823 Family history of stroke: Secondary | ICD-10-CM

## 2022-09-07 DIAGNOSIS — S78111A Complete traumatic amputation at level between right hip and knee, initial encounter: Secondary | ICD-10-CM | POA: Diagnosis not present

## 2022-09-07 DIAGNOSIS — T428X5A Adverse effect of antiparkinsonism drugs and other central muscle-tone depressants, initial encounter: Secondary | ICD-10-CM | POA: Diagnosis present

## 2022-09-07 DIAGNOSIS — K5900 Constipation, unspecified: Secondary | ICD-10-CM | POA: Diagnosis present

## 2022-09-07 DIAGNOSIS — R310 Gross hematuria: Secondary | ICD-10-CM | POA: Diagnosis not present

## 2022-09-07 DIAGNOSIS — I739 Peripheral vascular disease, unspecified: Secondary | ICD-10-CM | POA: Diagnosis present

## 2022-09-07 DIAGNOSIS — I69351 Hemiplegia and hemiparesis following cerebral infarction affecting right dominant side: Secondary | ICD-10-CM

## 2022-09-07 DIAGNOSIS — I69320 Aphasia following cerebral infarction: Secondary | ICD-10-CM | POA: Diagnosis not present

## 2022-09-07 DIAGNOSIS — E876 Hypokalemia: Secondary | ICD-10-CM | POA: Diagnosis present

## 2022-09-07 DIAGNOSIS — I70261 Atherosclerosis of native arteries of extremities with gangrene, right leg: Secondary | ICD-10-CM | POA: Diagnosis not present

## 2022-09-07 DIAGNOSIS — G40909 Epilepsy, unspecified, not intractable, without status epilepticus: Secondary | ICD-10-CM | POA: Diagnosis present

## 2022-09-07 DIAGNOSIS — Z8614 Personal history of Methicillin resistant Staphylococcus aureus infection: Secondary | ICD-10-CM | POA: Diagnosis not present

## 2022-09-07 DIAGNOSIS — E440 Moderate protein-calorie malnutrition: Secondary | ICD-10-CM | POA: Diagnosis present

## 2022-09-07 DIAGNOSIS — N179 Acute kidney failure, unspecified: Secondary | ICD-10-CM | POA: Diagnosis present

## 2022-09-07 DIAGNOSIS — R4182 Altered mental status, unspecified: Secondary | ICD-10-CM | POA: Diagnosis not present

## 2022-09-07 DIAGNOSIS — I10 Essential (primary) hypertension: Secondary | ICD-10-CM | POA: Diagnosis present

## 2022-09-07 DIAGNOSIS — Z89611 Acquired absence of right leg above knee: Secondary | ICD-10-CM

## 2022-09-07 DIAGNOSIS — G928 Other toxic encephalopathy: Secondary | ICD-10-CM | POA: Diagnosis present

## 2022-09-07 DIAGNOSIS — Z8249 Family history of ischemic heart disease and other diseases of the circulatory system: Secondary | ICD-10-CM

## 2022-09-07 DIAGNOSIS — Z7401 Bed confinement status: Secondary | ICD-10-CM | POA: Diagnosis not present

## 2022-09-07 DIAGNOSIS — G9340 Encephalopathy, unspecified: Secondary | ICD-10-CM | POA: Diagnosis not present

## 2022-09-07 DIAGNOSIS — G8111 Spastic hemiplegia affecting right dominant side: Secondary | ICD-10-CM | POA: Diagnosis not present

## 2022-09-07 DIAGNOSIS — Z7982 Long term (current) use of aspirin: Secondary | ICD-10-CM

## 2022-09-07 DIAGNOSIS — R531 Weakness: Secondary | ICD-10-CM | POA: Diagnosis not present

## 2022-09-07 DIAGNOSIS — Z23 Encounter for immunization: Secondary | ICD-10-CM

## 2022-09-07 LAB — COMPREHENSIVE METABOLIC PANEL
ALT: 8 U/L (ref 0–44)
AST: 12 U/L — ABNORMAL LOW (ref 15–41)
Albumin: 3.3 g/dL — ABNORMAL LOW (ref 3.5–5.0)
Alkaline Phosphatase: 50 U/L (ref 38–126)
Anion gap: 12 (ref 5–15)
BUN: 14 mg/dL (ref 6–20)
CO2: 27 mmol/L (ref 22–32)
Calcium: 9.6 mg/dL (ref 8.9–10.3)
Chloride: 103 mmol/L (ref 98–111)
Creatinine, Ser: 1.32 mg/dL — ABNORMAL HIGH (ref 0.61–1.24)
GFR, Estimated: >60 mL/min (ref >60)
Glucose, Bld: 117 mg/dL — ABNORMAL HIGH (ref 70–99)
Potassium: 3.4 mmol/L — ABNORMAL LOW (ref 3.5–5.1)
Sodium: 142 mmol/L (ref 135–145)
Total Bilirubin: 0.6 mg/dL (ref 0.3–1.2)
Total Protein: 7.6 g/dL (ref 6.5–8.1)

## 2022-09-07 LAB — CBC
HCT: 49.1 % (ref 39.0–52.0)
Hemoglobin: 15.9 g/dL (ref 13.0–17.0)
MCH: 31.8 pg (ref 26.0–34.0)
MCHC: 32.4 g/dL (ref 30.0–36.0)
MCV: 98.2 fL (ref 80.0–100.0)
Platelets: 381 10*3/uL (ref 150–400)
RBC: 5 MIL/uL (ref 4.22–5.81)
RDW: 12.9 % (ref 11.5–15.5)
WBC: 6.4 10*3/uL (ref 4.0–10.5)
nRBC: 0 % (ref 0.0–0.2)

## 2022-09-07 LAB — URINALYSIS, ROUTINE W REFLEX MICROSCOPIC
Glucose, UA: NEGATIVE mg/dL
Hgb urine dipstick: NEGATIVE
Ketones, ur: 20 mg/dL — AB
Leukocytes,Ua: NEGATIVE
Nitrite: NEGATIVE
Protein, ur: 30 mg/dL — AB
Specific Gravity, Urine: 1.028 (ref 1.005–1.030)
pH: 5 (ref 5.0–8.0)

## 2022-09-07 LAB — RAPID URINE DRUG SCREEN, HOSP PERFORMED
Amphetamines: NOT DETECTED
Barbiturates: NOT DETECTED
Benzodiazepines: NOT DETECTED
Cocaine: NOT DETECTED
Opiates: NOT DETECTED
Tetrahydrocannabinol: NOT DETECTED

## 2022-09-07 LAB — SALICYLATE LEVEL: Salicylate Lvl: <7 mg/dL — ABNORMAL LOW (ref 7.0–30.0)

## 2022-09-07 LAB — VALPROIC ACID LEVEL: Valproic Acid Lvl: 95 ug/mL (ref 50.0–100.0)

## 2022-09-07 LAB — ACETAMINOPHEN LEVEL: Acetaminophen (Tylenol), Serum: <10 ug/mL — ABNORMAL LOW (ref 10–30)

## 2022-09-07 MED ORDER — ENOXAPARIN SODIUM 30 MG/0.3ML IJ SOSY
30.0000 mg | PREFILLED_SYRINGE | INTRAMUSCULAR | Status: DC
  Administered 2022-09-08: 30 mg via SUBCUTANEOUS
  Filled 2022-09-07: qty 0.3

## 2022-09-07 MED ORDER — ASPIRIN 81 MG PO TBEC
81.0000 mg | DELAYED_RELEASE_TABLET | Freq: Every day | ORAL | Status: DC
  Administered 2022-09-07 – 2022-09-10 (×4): 81 mg via ORAL
  Filled 2022-09-07 (×4): qty 1

## 2022-09-07 MED ORDER — ACETAMINOPHEN 325 MG PO TABS: 650.0000 mg | ORAL_TABLET | Freq: Four times a day (QID) | ORAL | Status: DC | PRN

## 2022-09-07 MED ORDER — ONDANSETRON HCL 4 MG PO TABS: 4.0000 mg | ORAL_TABLET | Freq: Four times a day (QID) | ORAL | Status: DC | PRN

## 2022-09-07 MED ORDER — SENNA 8.6 MG PO TABS
1.0000 | ORAL_TABLET | Freq: Two times a day (BID) | ORAL | Status: DC
  Administered 2022-09-07 – 2022-09-08 (×3): 8.6 mg via ORAL
  Filled 2022-09-07 (×3): qty 1

## 2022-09-07 MED ORDER — POLYETHYLENE GLYCOL 3350 17 G PO PACK
17.0000 g | PACK | Freq: Every day | ORAL | Status: DC
  Administered 2022-09-08: 17 g via ORAL
  Filled 2022-09-07 (×2): qty 1

## 2022-09-07 MED ORDER — LACOSAMIDE 50 MG PO TABS
50.0000 mg | ORAL_TABLET | Freq: Two times a day (BID) | ORAL | Status: DC
  Administered 2022-09-07 – 2022-09-10 (×6): 50 mg via ORAL
  Filled 2022-09-07 (×6): qty 1

## 2022-09-07 MED ORDER — LEVETIRACETAM 750 MG PO TABS
1500.0000 mg | ORAL_TABLET | Freq: Two times a day (BID) | ORAL | Status: DC
  Administered 2022-09-07 – 2022-09-10 (×6): 1500 mg via ORAL
  Filled 2022-09-07 (×6): qty 2

## 2022-09-07 MED ORDER — ATORVASTATIN CALCIUM 10 MG PO TABS
20.0000 mg | ORAL_TABLET | Freq: Every day | ORAL | Status: DC
  Administered 2022-09-07 – 2022-09-08 (×2): 20 mg via ORAL
  Filled 2022-09-07 (×3): qty 2

## 2022-09-07 MED ORDER — AMLODIPINE BESYLATE 10 MG PO TABS
10.0000 mg | ORAL_TABLET | Freq: Every day | ORAL | Status: DC
  Administered 2022-09-08 – 2022-09-10 (×3): 10 mg via ORAL
  Filled 2022-09-07 (×3): qty 1

## 2022-09-07 MED ORDER — FOLIC ACID 1 MG PO TABS
1.0000 mg | ORAL_TABLET | Freq: Every day | ORAL | Status: DC
  Administered 2022-09-07 – 2022-09-10 (×4): 1 mg via ORAL
  Filled 2022-09-07 (×4): qty 1

## 2022-09-07 MED ORDER — LACTATED RINGERS IV BOLUS
1000.0000 mL | Freq: Once | INTRAVENOUS | Status: AC
  Administered 2022-09-07: 1000 mL via INTRAVENOUS

## 2022-09-07 MED ORDER — ONDANSETRON HCL 4 MG/2ML IJ SOLN: 4.0000 mg | Freq: Four times a day (QID) | INTRAMUSCULAR | Status: DC | PRN

## 2022-09-07 MED ORDER — DIVALPROEX SODIUM ER 500 MG PO TB24
1500.0000 mg | ORAL_TABLET | Freq: Every day | ORAL | Status: DC
  Administered 2022-09-07 – 2022-09-09 (×3): 1500 mg via ORAL
  Filled 2022-09-07 (×4): qty 3

## 2022-09-07 MED ORDER — PNEUMOCOCCAL 20-VAL CONJ VACC 0.5 ML IM SUSY
0.5000 mL | PREFILLED_SYRINGE | INTRAMUSCULAR | Status: AC
  Administered 2022-09-10: 0.5 mL via INTRAMUSCULAR
  Filled 2022-09-07: qty 0.5

## 2022-09-07 MED ORDER — THIAMINE MONONITRATE 100 MG PO TABS
100.0000 mg | ORAL_TABLET | Freq: Every day | ORAL | Status: DC
  Administered 2022-09-07 – 2022-09-10 (×4): 100 mg via ORAL
  Filled 2022-09-07 (×4): qty 1

## 2022-09-07 MED ORDER — ACETAMINOPHEN 650 MG RE SUPP: 650.0000 mg | Freq: Four times a day (QID) | RECTAL | Status: DC | PRN

## 2022-09-07 MED ORDER — LACTATED RINGERS IV SOLN: INTRAVENOUS | Status: AC

## 2022-09-07 NOTE — Evaluation (Signed)
Physical Therapy Evaluation Patient Details Name: Gerald [name] MRN: [id number] DOB: January 23, 1961 Today's Date: 09/07/2022  History of Present Illness  62 y.o. male presents to [facility] hospital on 09/07/2022 with AMS and worsening RUE weakness. 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 PMH includes L ACA CVA, HTN, seizure disorder, PAD, alcohol and cocaine abuse.  Clinical Impression  Pt presents to PT with deficits in functional mobility, balance, strength, power, cognition, communication. At baseline pt requires assistance to transfer, but is able to mobilize in a manual wheelchair without assistance. Currently the patient requires significant assistance to mobilize in bed and to stand. Formal assessment is difficult at this time due to communication and cognition deficits. Pt appears to have impaired coordination of LUE vs vision impairment, often having difficulty grasping desired objects with LUE. Pt is very emotional during session, appearing stessed by his symptoms. PT will continue to follow in the acute setting in an effort to reduce caregiver burden. PT recommends inpatient PT services post-acute.       Recommendations for follow up therapy are one component of a multi-disciplinary discharge planning process, led by the attending physician.  Recommendations may be updated based on patient status, additional functional criteria and insurance authorization.  Follow Up Recommendations Can patient physically be transported by private vehicle: No     Assistance Recommended at Discharge Frequent or constant Supervision/Assistance  Patient can return home with the following  A lot of help with walking and/or transfers;A lot of help with bathing/dressing/bathroom;Assistance with cooking/housework;Direct supervision/assist for medications management;Direct supervision/assist for financial management;Assist for transportation;Help with stairs or ramp for  entrance    Equipment Recommendations  (defer to post-acute)  Recommendations for Other Services       Functional Status Assessment Patient has had a recent decline in their functional status and demonstrates the ability to make significant improvements in function in a reasonable and predictable amount of time.     Precautions / Restrictions Precautions Precautions: Fall Precaution Comments: R AKA, R hemiplegia Restrictions Weight Bearing Restrictions: Yes RLE Weight Bearing: Non weight bearing Other Position/Activity Restrictions: R AKA on 2/16      Mobility  Bed Mobility Overal bed mobility: Needs Assistance Bed Mobility: Supine to Sit, Sit to Supine     Supine to sit: Max assist, HOB elevated Sit to supine: Max assist, HOB elevated        Transfers Overall transfer level: Needs assistance Equipment used: 1 person hand held assist Transfers: Sit to/from Stand Sit to Stand: Max assist, From elevated surface           General transfer comment: PT providing support at trunk and L knee block. Pt with difficulty following commands for hand placement, seems to have impaired coordination vs vision impairment    Ambulation/Gait                  Stairs            Wheelchair Mobility    Modified Rankin (Stroke Patients Only) Modified Rankin (Stroke Patients Only) Pre-Morbid Rankin Score: Moderately severe disability Modified Rankin: Severe disability     Balance Overall balance assessment: Needs assistance Sitting-balance support: No upper extremity supported, Single extremity supported, Feet supported Sitting balance-Leahy Scale: Poor Sitting balance - Comments: modA initially, progressing to minA/minG Postural control: Posterior lean Standing balance support: Bilateral upper extremity supported Standing balance-Leahy Scale: Poor Standing balance comment: maxA  Pertinent Vitals/Pain Pain  Assessment Pain Assessment: 0-10 Pain Score:  (pt is unable to quantify) Pain Location: all over Pain Descriptors / Indicators: Aching Pain Intervention(s): Monitored during session    Home Living Family/patient expects to be discharged to:: Private residence Living Arrangements: Spouse/significant other Available Help at Discharge: Family;Available 24 hours/day Type of Home: Apartment Home Access: Ramped entrance       Home Layout: One level Home Equipment: Conservation officer, nature (2 wheels);BSC/3in1;Wheelchair - manual;Shower seat;Grab bars - tub/shower;Grab bars - toilet;Hand held shower head;Other (comment) (STEDY)      Prior Function Prior Level of Function : Needs assist             Mobility Comments: transfers to wheelchair with assistance from spouse, able to mobilize in wheelchair at supervision level at the end of rehab stay prior to decline at home. ADLs Comments: Pt has been able to dress himself, participate in bathing and assist with most ADLs while in SNF recently, since has required maxA-totalA for most ADLs at home     Hand Dominance   Dominant Hand: Right    Extremity/Trunk Assessment   Upper Extremity Assessment Upper Extremity Assessment: RUE deficits/detail;LUE deficits/detail RUE Deficits / Details: pt with chronic R hemiplegia. Pt reports pain in R arm, declining formal assessment of ROM/strength. PT does note activation of shoulder flexions and adductors, as well as flexion of digits LUE Deficits / Details: generalized weakness, ROM WFL LUE Coordination: decreased fine motor;decreased gross motor    Lower Extremity Assessment Lower Extremity Assessment: RLE deficits/detail;LLE deficits/detail RLE Deficits / Details: R AKA, hip ROM appears WFL LLE Deficits / Details: LLE grossly 4-/5, difficult to formally assess due to impaired cognition and communication    Cervical / Trunk Assessment Cervical / Trunk Assessment: Other exceptions Cervical / Trunk  Exceptions: pt with tight R upper trap leading to hiked shoulder and mild L lateral trunk lean  Communication   Communication: Receptive difficulties;Expressive difficulties  Cognition Arousal/Alertness: Awake/alert Behavior During Therapy: Anxious Overall Cognitive Status: Difficult to assess Area of Impairment: Orientation, Attention, Memory, Following commands, Safety/judgement, Awareness, Problem solving                 Orientation Level:  (pt is able to report name and DOB, perseverates on DOB and is unable to answer further orientation questions) Current Attention Level: Focused Memory: Decreased recall of precautions Following Commands: Follows one step commands with increased time, Follows multi-step commands inconsistently Safety/Judgement: Decreased awareness of safety, Decreased awareness of deficits Awareness: Intellectual Problem Solving: Slow processing, Decreased initiation, Requires verbal cues          General Comments General comments (skin integrity, edema, etc.): VSS on RA, pt is emotional throughout session, crying multiple times. Appears stressed about symptoms    Exercises     Assessment/Plan    PT Assessment Patient needs continued PT services  PT Problem List Decreased strength;Decreased activity tolerance;Decreased range of motion;Decreased balance;Decreased mobility;Decreased coordination;Decreased cognition;Decreased knowledge of use of DME;Decreased safety awareness;Decreased knowledge of precautions;Impaired tone       PT Treatment Interventions DME instruction;Functional mobility training;Therapeutic activities;Therapeutic exercise;Neuromuscular re-education;Balance training;Patient/family education;Cognitive remediation;Wheelchair mobility training    PT Goals (Current goals can be found in the Care Plan section)  Acute Rehab PT Goals Patient Stated Goal: to return to recent baseline, transfer with less assistance and improve ability to  perform ADLs PT Goal Formulation: With family Time For Goal Achievement: 09/21/22 Potential to Achieve Goals: Fair    Frequency Min 3X/week  Co-evaluation               AM-PAC PT "6 Clicks" Mobility  Outcome Measure Help needed turning from your back to your side while in a flat bed without using bedrails?: A Lot Help needed moving from lying on your back to sitting on the side of a flat bed without using bedrails?: A Lot Help needed moving to and from a bed to a chair (including a wheelchair)?: A Lot Help needed standing up from a chair using your arms (e.g., wheelchair or bedside chair)?: A Lot Help needed to walk in hospital room?: Total Help needed climbing 3-5 steps with a railing? : Total 6 Click Score: 10    End of Session   Activity Tolerance: Patient tolerated treatment well Patient left: in bed;with call bell/phone within reach;with bed alarm set;with family/visitor present Nurse Communication: Mobility status;Need for lift equipment PT Visit Diagnosis: Other abnormalities of gait and mobility (R26.89);Muscle weakness (generalized) (M62.81);Other symptoms and signs involving the nervous system (R29.898);Hemiplegia and hemiparesis Hemiplegia - Right/Left: Right Hemiplegia - dominant/non-dominant: Dominant Hemiplegia - caused by: Cerebral infarction (chronic)    Time: [id number] PT Time Calculation (min) (ACUTE ONLY): 24 min   Charges:   PT Evaluation $PT Eval Low Complexity: 1 Low          Zenaida Niece, PT, DPT Acute Rehabilitation Office [place] 09/07/2022, 4:28 PM

## 2022-09-07 NOTE — Hospital Course (Addendum)
   Toxic metabolic encephalopathy  His spouse reports he has difficulty maintaining adequate hydration. This along with urination changes may be contributing to his symptoms. Patient is currently on multiple AEDs, baclofen, and robaxin which increased his risk for confusion. Held baclofen and robaxin during hospital stay. Also considered another CVA given inability to fully neurological assess patient due to his mental status. MRI showed no evidence of acute/subacute infarct. He is high risk of aspiration as he recently has history on choking on his food, placed on dysphagia 3 diet. Patient was given LR bolus and continuous fluid initially. Mentation improved in the following days and returned to baseline. Patient was discharged home with supplies including drop arm, hoyer lift, and semi-electric.  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 Depokate level normal. EEG showed no seizure activity. Resumed home ASA, statin, depakote, keppra, and vimpat.   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 Trula Slade. Per vascular surgery note from 3/20, staples are to be removed around 3/27. Staples appear clean, dry, and intact. Appointment scheduled for 4/3 for staple removal.

## 2022-09-07 NOTE — Assessment & Plan Note (Addendum)
Patient presents today for hospital follow-up after recent right AKA in the setting of gangrene 2/2 critical limb ischemia of right lower extremity.  Today, patient's wife reports that he has had a change in mental status since coming home from rehab facility about 10 days ago.  She reports that he had been communicating normally (this is corroborated by provider) and over the course of about a day grew less and less communicative, now only being able to speak 1 word.  He commonly perseverates on saying yes and nodding his head.  He has also had some worsening of his right upper extremity weakness and immobility.  Unable to comply with neuroexam.  Patient has history of seizure disorder, dehydration/nutrition challenges, stroke, polypharmacy with centrally active medications, and recent prolonged stay in healthcare facility -all of which could be contributory.  I feel this warrants direct admission for evaluation and treatment. -Direct admit to inpatient for further evaluation

## 2022-09-07 NOTE — Progress Notes (Signed)
Report given to [place], RN 3 [place]. Patient alert and oriented.  Transported via wheelchair accompanied by wife.  Patient to [address] [place] [address].

## 2022-09-07 NOTE — Assessment & Plan Note (Signed)
Right AKA continues to be well-healing.  Plans to have staples removed soon.  Saw vascular surgery last week for follow-up.  No signs or symptoms of infection or dehiscence. -Follow-up with vascular surgery

## 2022-09-07 NOTE — Progress Notes (Signed)
PT Cancellation Note  Patient Details Name: Gerald [name] MRN: [id number] DOB: May 16, 1961   Cancelled Treatment:    Reason Eval/Treat Not Completed: Patient at procedure or test/unavailable. Transport present to take pt off floor for imaging. PT will follow up as time allows.   Zenaida Niece 09/07/2022, 2:13 PM
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
## 2022-09-07 NOTE — Progress Notes (Signed)
   CC: Hospital follow-up  HPI:  Mr.Yolanda R Wallar is a 62 y.o. with past medical history as below who presents for hospital follow-up.  See detailed assessment and plan for HPI.  Past Medical History:  Diagnosis Date   Alcohol abuse    Cocaine abuse ([place]) 2014   Hypertension    Infection of wound due to methicillin resistant Staphylococcus aureus (MRSA)    Seizures ([place]) 07/2015   had first seizures about 6 months after stroke   Status epilepticus ([place]) 07/27/2021   Stroke ([place]) 2014   denies residual on 07/03/2015   Stroke ([place]) 07/02/2015   "now weak on right side; speech problems" (07/03/2015)   Tobacco use    Review of Systems: See detailed assessment and plan for pertinent ROS  Physical Exam:  Vitals:   09/07/22 0919  BP: 139/62  Pulse: (!) 112  Temp: 98.2 F (36.8 C)  TempSrc: Oral  SpO2: 98%  Height: 6' (1.829 m)   Physical Exam Constitutional:      General: He is not in acute distress. HENT:     Head: Normocephalic and atraumatic.  Cardiovascular:     Rate and Rhythm: Normal rate and regular rhythm.  Pulmonary:     Effort: Pulmonary effort is normal.     Breath sounds: No wheezing, rhonchi or rales.  Skin:    General: Skin is warm and dry.  Neurological:     Mental Status: He is alert.     Comments: Unable to comply with neuroexam given inability to follow directions.  Largely noncommunicative though seems most consistent with expressive aphasia.  Immobility predominantly of the right upper extremity which may or may not have worsened since last stroke.      Assessment & Plan:   See Encounters Tab for problem based charting.  Above knee amputation of right lower extremity (HCC) Right AKA continues to be well-healing.  Plans to have staples removed soon.  Saw vascular surgery last week for follow-up.  No signs or symptoms of infection or dehiscence. -Follow-up with vascular surgery  Gross hematuria Patient found to have hematuria during her recent  admission for acute limb ischemia.  Renal ultrasound with likely benign renal cyst. -Follow-up with UA while inpatient  Encephalopathy Patient presents today for hospital follow-up after recent right AKA in the setting of gangrene 2/2 critical limb ischemia of right lower extremity.  Today, patient's wife reports that he has had a change in mental status since coming home from rehab facility about 10 days ago.  She reports that he had been communicating normally (this is corroborated by provider) and over the course of about a day grew less and less communicative, now only being able to speak 1 word.  He commonly perseverates on saying yes and nodding his head.  He has also had some worsening of his right upper extremity weakness and immobility.  Unable to comply with neuroexam.  Patient has history of seizure disorder, dehydration/nutrition challenges, stroke, polypharmacy with centrally active medications, and recent prolonged stay in healthcare facility -all of which could be contributory.  I feel this warrants direct admission for evaluation and treatment. -Direct admit to inpatient for further evaluation    Patient discussed with Dr. Daryll Drown

## 2022-09-07 NOTE — Telephone Encounter (Signed)
Date: 09/07/2022               Patient Name:  Gerald [name] MRN: [id number]  DOB: 07-26-1960 Age / Sex: 62 y.o., male   PCP: Linward Natal, MD         Medical Service: Internal Medicine Teaching Service         Attending Physician: Dr. Rayne Du att. providers found    First Contact: Dr. Coralyn Pear  Pager: [phone]  Second Contact: Dr. Christiana Fuchs  Pager: [phone]       After Hours (After 5p/  First Contact Pager: [phone]  weekends / holidays): Second Contact Pager: [phone]   Chief Complaint: altered mentation   History of Present Illness:   Mr. Gerald [name] is a 62 year old M with a PMH of seizure disorder on 3 AEDs, CVA (L ACA infarct with residual dysarthria and RUE spasticity, HTN, PAD s/p R AKA and polysubstance use who presented to clinic with altered mentation.   History is provided primarily by patient's spouse who is present at his clinic visit.    She states that over the last few weeks she has noticed that patient has been more confused and his speech more garbled. She states that at baseline since his strokes, the most recent of which was in 2017 that patient has been intermittently confused, but notes that he is usually able to follow instructions and his oriented to himself and situation. She states patient has also been more "fidgety" noting that it has been difficult for him to sit still. She states that patient will also have episodes where he stares off in the distance, but notes these episodes are not like his seizures.   She states that he has not complained of feeling feverish, trouble breathing, chest pain, chills, abdominal pain, or dysuria.   Of note, he does have some difficulty with swallowing pills sometimes since his stroke, he also has trouble with maintaining adequate intake with food and drink.   Patient also drinks 1-2 cans of beer a day and has not had a seizure in many years.   Meds:   Current Outpatient Medications on File Prior to  Visit  Medication Sig Dispense Refill   acetaminophen (TYLENOL) 500 MG tablet Take 2 tablets (1,000 mg total) by mouth every 8 (eight) hours. 30 tablet 0   alum & mag hydroxide-simeth (MAALOX/MYLANTA) 200-200-20 MG/5ML suspension Take 15-30 mLs by mouth every 2 (two) hours as needed for indigestion. 355 mL 0   amLODipine-olmesartan (AZOR) 10-40 MG tablet Take 1 tablet by mouth daily. 90 tablet 2   aspirin EC 81 MG tablet Take 1 tablet (81 mg total) by mouth daily. Swallow whole. 30 tablet 12   atorvastatin (LIPITOR) 20 MG tablet Take 1 tablet (20 mg total) by mouth daily at 6 PM. 30 tablet 3   Baclofen 5 MG TABS Take 5 mg by mouth 3 (three) times daily.     divalproex (DEPAKOTE ER) 500 MG 24 hr tablet Take 1,500 mg by mouth at bedtime.     feeding supplement (ENSURE ENLIVE / ENSURE PLUS) LIQD Take 237 mLs by mouth 2 (two) times daily between meals. 237 mL 12   fluticasone (FLONASE) 50 MCG/ACT nasal spray Place 1 spray into both nostrils daily. 11.1 mL 2   guaiFENesin-dextromethorphan (ROBITUSSIN DM) 100-10 MG/5ML syrup Take 15 mLs by mouth every 4 (four) hours as needed for cough. 118 mL 0   lacosamide (VIMPAT) 50 MG TABS tablet Take  1 tablet (50 mg total) by mouth 2 (two) times daily. 60 tablet 0   levETIRAcetam (KEPPRA) 750 MG tablet Take 2 tablets (1,500 mg total) by mouth 2 (two) times daily. 360 tablet 3   metoprolol tartrate (LOPRESSOR) 25 MG tablet Take 1 tablet (25 mg total) by mouth 2 (two) times daily. 60 tablet 0   ondansetron (ZOFRAN) 4 MG/2ML SOLN injection Inject 2 mLs (4 mg total) into the vein every 6 (six) hours as needed for nausea or vomiting. 2 mL 0   pantoprazole (PROTONIX) 40 MG tablet Take 1 tablet (40 mg total) by mouth daily. 30 tablet 0   polyethylene glycol (MIRALAX / GLYCOLAX) 17 g packet Take 17 g by mouth daily. 14 each 0   No current facility-administered medications on file prior to visit.      Allergies: Allergies as of 09/07/2022   (No Known Allergies)    Past Medical History:  Diagnosis Date   Alcohol abuse    Cocaine abuse ([place]) 2014   Hypertension    Infection of wound due to methicillin resistant Staphylococcus aureus (MRSA)    Seizures ([place]) 07/2015   had first seizures about 6 months after stroke   Status epilepticus ([place]) 07/27/2021   Stroke ([place]) 2014   denies residual on 07/03/2015   Stroke ([place]) 07/02/2015   "now weak on right side; speech problems" (07/03/2015)   Tobacco use     Family History:  Family History  Problem Relation Age of Onset   Hypertension Mother    Hypertension Father    Stroke Paternal Aunt      Social History:  Social History   Socioeconomic History   Marital status: Significant Other    Spouse name: Not on file   Number of children: Not on file   Years of education: Not on file   Highest education level: Not on file  Occupational History   Not on file  Tobacco Use   Smoking status: Some Days    Packs/day: 0.25    Years: 34.00    Additional pack years: 0.00    Total pack years: 8.50    Types: Cigarettes    Last attempt to quit: 07/01/2015    Years since quitting: 7.1   Smokeless tobacco: Never   Tobacco comments:    smokes 1-2 cigarette daily  Vaping Use   Vaping Use: Never used  Substance and Sexual Activity   Alcohol use: Yes    Alcohol/week: 1.0 - 2.0 standard drink of alcohol    Types: 1 - 2 Cans of beer per week    Comment: occassionally -beer   Drug use: No    Types: Cocaine    Comment: 07/03/2015 "hasn't had any for some time"   Sexual activity: Yes  Other Topics Concern   Not on file  Social History Narrative   Not on file   Social Determinants of Health   Financial Resource Strain: Not on file  Food Insecurity: No Food Insecurity (07/29/2022)   Hunger Vital Sign    Worried About Running Out of Food in the Last Year: Never true    Ran Out of Food in the Last Year: Never true  Transportation Needs: No Transportation Needs (07/29/2022)   PRAPARE - Armed forces logistics/support/administrative officer (Medical): No    Lack of Transportation (Non-Medical): No  Physical Activity: Not on file  Stress: Not on file  Social Connections: Moderately Integrated (06/15/2022)   Social Connection and Isolation Panel [NHANES]  Frequency of Communication with Friends and Family: More than three times a week    Frequency of Social Gatherings with Friends and Family: More than three times a week    Attends Religious Services: More than 4 times per year    Active Member of [employer] or Organizations: No    Attends Archivist Meetings: Never    Marital Status: Married  Human resources officer Violence: Not At Risk (07/29/2022)   Humiliation, Afraid, Rape, and Kick questionnaire    Fear of Current or Ex-Partner: No    Emotionally Abused: No    Physically Abused: No    Sexually Abused: No     Review of Systems: Unable to adequately assess due to patient's confusion   Physical Exam: @V @ Constitutional: Chronically ill appearing and in no distress.   Cardiovascular: Normal rate, regular rhythm, intact distal pulses. No gallop and no friction rub.  No murmur heard. No lower extremity edema  Pulmonary: Non labored breathing on room air, no wheezing or rales  Abdominal: Soft. Normal bowel sounds. Non distended and non tender Musculoskeletal: Normal range of motion. S/p R AKA      General: No tenderness or edema.  Neurological: Alert and oriented to person only, intermittently follows commands  Skin: Skin is warm and dry.    EKG: personally reviewed my interpretation is  CXR: personally reviewed my interpretation is  Assessment & Plan by Problem: Active Problems:   * No active hospital problems. *  Toxic metabolic encephalopathy  Unclear exact etiology of patient's symptoms. His spouse reports he has difficulty maintaining adequate hydration. In addition she states that he is currently on multiple AEDs and baclofen. Suspect that he his inability to maintain hydration due  to his deficits from his CVA lead to an AKI which alone could cause his confusion. This was likely compounded by his baclofen use. Patient's baclofen use may also explain his possible akathisia. Patient is also on multiple AEDs which can also cause confusion.  Also considered another CVA given inability to fully neurological assess patient due to his mental status.  Plan: CT head w/o contrast  F/u CMP F/u AED levels  Hold baclofen  Patient may ultimately need more assistance at home to help ensure his nutrition is adequate Consult dietitian while inpatient   Likely AKI  Bolus 1L of LR over 2 hours   PAD  S/p R AKA. 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  Signed: Rick Duff, MD 09/07/2022, 1:21 PM  After 5pm on weekdays and 1pm on weekends: On Call pager: [phone]

## 2022-09-07 NOTE — Assessment & Plan Note (Signed)
Patient found to have hematuria during her recent admission for acute limb ischemia.  Renal ultrasound with likely benign renal cyst. -Follow-up with UA while inpatient

## 2022-09-08 ENCOUNTER — Observation Stay (HOSPITAL_COMMUNITY): Payer: PPO

## 2022-09-08 DIAGNOSIS — G40909 Epilepsy, unspecified, not intractable, without status epilepticus: Secondary | ICD-10-CM

## 2022-09-08 DIAGNOSIS — G928 Other toxic encephalopathy: Secondary | ICD-10-CM | POA: Diagnosis not present

## 2022-09-08 DIAGNOSIS — R4182 Altered mental status, unspecified: Secondary | ICD-10-CM | POA: Diagnosis not present

## 2022-09-08 LAB — MAGNESIUM: Magnesium: 1.7 mg/dL (ref 1.7–2.4)

## 2022-09-08 LAB — BASIC METABOLIC PANEL
Anion gap: 7 (ref 5–15)
BUN: 10 mg/dL (ref 6–20)
CO2: 26 mmol/L (ref 22–32)
Calcium: 8.8 mg/dL — ABNORMAL LOW (ref 8.9–10.3)
Chloride: 107 mmol/L (ref 98–111)
Creatinine, Ser: 0.8 mg/dL (ref 0.61–1.24)
GFR, Estimated: >60 mL/min (ref >60)
Glucose, Bld: 116 mg/dL — ABNORMAL HIGH (ref 70–99)
Potassium: 3.5 mmol/L (ref 3.5–5.1)
Sodium: 140 mmol/L (ref 135–145)

## 2022-09-08 LAB — CBC
HCT: 37.1 % — ABNORMAL LOW (ref 39.0–52.0)
Hemoglobin: 12.3 g/dL — ABNORMAL LOW (ref 13.0–17.0)
MCH: 32.6 pg (ref 26.0–34.0)
MCHC: 33.2 g/dL (ref 30.0–36.0)
MCV: 98.4 fL (ref 80.0–100.0)
Platelets: 445 10*3/uL — ABNORMAL HIGH (ref 150–400)
RBC: 3.77 MIL/uL — ABNORMAL LOW (ref 4.22–5.81)
RDW: 13.1 % (ref 11.5–15.5)
WBC: 7.2 10*3/uL (ref 4.0–10.5)
nRBC: 0 % (ref 0.0–0.2)

## 2022-09-08 LAB — LEVETIRACETAM LEVEL: Levetiracetam Lvl: 123.2 ug/mL — ABNORMAL HIGH (ref 10.0–40.0)

## 2022-09-08 MED ORDER — ENSURE ENLIVE PO LIQD
237.0000 mL | Freq: Two times a day (BID) | ORAL | Status: DC
  Administered 2022-09-08 – 2022-09-10 (×4): 237 mL via ORAL
  Filled 2022-09-08: qty 237

## 2022-09-08 MED ORDER — POTASSIUM CHLORIDE 20 MEQ PO PACK
40.0000 meq | PACK | Freq: Once | ORAL | Status: AC
  Administered 2022-09-08: 40 meq via ORAL
  Filled 2022-09-08: qty 2

## 2022-09-08 MED ORDER — BISACODYL 10 MG RE SUPP
10.0000 mg | Freq: Once | RECTAL | Status: AC
  Administered 2022-09-08: 10 mg via RECTAL
  Filled 2022-09-08: qty 1

## 2022-09-08 MED ORDER — ENOXAPARIN SODIUM 40 MG/0.4ML IJ SOSY
40.0000 mg | PREFILLED_SYRINGE | INTRAMUSCULAR | Status: DC
  Administered 2022-09-09 – 2022-09-10 (×2): 40 mg via SUBCUTANEOUS
  Filled 2022-09-08 (×2): qty 0.4

## 2022-09-08 MED ORDER — MAGNESIUM SULFATE 2 GM/50ML IV SOLN
2.0000 g | Freq: Once | INTRAVENOUS | Status: AC
  Administered 2022-09-08: 2 g via INTRAVENOUS
  Filled 2022-09-08: qty 50

## 2022-09-08 NOTE — Progress Notes (Signed)
Initial Nutrition Assessment  DOCUMENTATION CODES:   Non-severe (moderate) malnutrition in context of chronic illness  INTERVENTION: Continue current diet as ordered by SLP Automatic tray, encourage PO intake Ensure Enlive po BID, each supplement provides 350 kcal and 20 grams of protein. Magic cup TID with meals, each supplement provides 290 kcal and 9 grams of protein  NUTRITION DIAGNOSIS:   Moderate Malnutrition (in the context of chronic illness) related to  (inadequate oral intake) as evidenced by mild muscle depletion, mild fat depletion.  GOAL:   Patient will meet greater than or equal to 90% of their needs  MONITOR:   PO intake, Supplement acceptance, Skin, Labs, Weight trends  REASON FOR ASSESSMENT:  Consult Assessment of nutrition requirement/status  ASSESSMENT:  Pt with hx of seizure disorder, prior CVAs with residual dysarthria, HTN, PAD s/p recent right AKA, and hx of polysubstance abuse sent to ED from PCP with AMS and general malaise. Found to have an AKI   Pt resting in bed at the time of assessment. Ate well at breakfast after having diet advanced but lunch tray untouched. RN states he refused to eat anything from the tray. Pt sleeping but did wake to gentle touch. Pt still with some difficulty communicating due to dysarthria. Mostly gives one word answers. Pt does indicate he has a good appetite at baseline but muscle and fat deficits are present. Pt likes ensure, will add supplements to augment intake.    Average Meal Intake: 3/27: 75% intake x 1 recorded meals  Nutritionally Relevant Medications: Scheduled Meds:  atorvastatin  20 mg Oral 99991111   folic acid  1 mg Oral Daily   polyethylene glycol  17 g Oral Daily   senna  1 tablet Oral BID   thiamine  100 mg Oral Daily   Labs Reviewed  NUTRITION - FOCUSED PHYSICAL EXAM: Flowsheet Row Most Recent Value  Orbital Region Mild depletion  Upper Arm Region Mild depletion  Thoracic and Lumbar Region Mild  depletion  Buccal Region No depletion  Temple Region No depletion  Clavicle Bone Region Mild depletion  Clavicle and Acromion Bone Region No depletion  Scapular Bone Region No depletion  Dorsal Hand Moderate depletion  Patellar Region Moderate depletion  Anterior Thigh Region Moderate depletion  Posterior Calf Region Moderate depletion  Edema (RD Assessment) None  Hair Reviewed  Eyes Reviewed  Mouth Reviewed  Skin Reviewed  Nails Reviewed    Diet Order:   Diet Order             DIET DYS 3 Room service appropriate? Yes; Fluid consistency: Thin  Diet effective now                   EDUCATION NEEDS:  Not appropriate for education at this time  Skin:  Skin Assessment: Reviewed RN Assessment (surgical incision to the right thigh from recent AKA)  Last BM:  3/26  Height:  Ht Readings from Last 1 Encounters:  09/07/22 6' (1.829 m)    Weight:  Wt Readings from Last 1 Encounters:  09/07/22 88.4 kg    Ideal Body Weight:  72.8 kg (adjusted by 10% for AKA)  BMI:  Body mass index is 26.43 kg/m.  Estimated Nutritional Needs:  Kcal:  1800-2000 kcal/d Protein:  90-105g/d Fluid:  >/=1.8L/d    Ranell Patrick, RD, LDN Clinical Dietitian RD pager # available in [facility]  After hours/weekend pager # available in [facility]

## 2022-09-08 NOTE — Progress Notes (Signed)
    Durable Medical Equipment  (From admission, onward)           Start     Ordered   09/08/22 1507  For home use only DME Hospital bed  Once       Question Answer Comment  Length of Need 6 Months   The above medical condition requires: Patient requires the ability to reposition frequently   Head must be elevated greater than: 30 degrees   Bed type Semi-electric   Hoyer Lift Yes      09/08/22 1507

## 2022-09-08 NOTE — Progress Notes (Addendum)
HD#1 SUBJECTIVE:  Patient Summary: Gerald [name] is a 62 y.o. 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  Overnight Events: NAEON. Visited patient as he was transferring from chair to bed. Patient is more alert, able to respond yes or no to questions and was able to state his full name and asked "are we at [facility]." Per patient's wife, he is doing much better today, although he is still confused at times. She also reports that patient has been able to eat a little today.     OBJECTIVE:  Vital Signs: Vitals:   09/07/22 1508 09/07/22 2012 09/08/22 0012 09/08/22 0340  BP: 111/77 115/72 (!) 156/73 106/64  Pulse: 87 88 (!) 103 92  Resp: 18 18 18 18   Temp: 97.8 F (36.6 C) 98.2 F (36.8 C) 98.6 F (37 C) 98.9 F (37.2 C)  TempSrc: Oral Oral Oral Oral  SpO2: 95% 100% (!) 88% 97%  Weight:      Height:       Supplemental O2: Room Air SpO2: 97 %  Filed Weights   09/07/22 1300  Weight: 88.4 kg     Intake/Output Summary (Last 24 hours) at 09/08/2022 [id number] Last data filed at 09/08/2022 [id number] Gross per 24 hour  Intake 2000 ml  Output --  Net 2000 ml   Net IO Since Admission: 2,000 mL [09/08/22 0706]  Physical Exam: Physical Exam Constitutional: tired, appears older than stated age male sitting in bed, in no acute distress HENT: normocephalic atraumatic, mucous membranes moist Cardiovascular: regular rate and rhythm, no m/r/g Pulmonary/Chest: normal work of breathing on room air, lungs clear to auscultation bilaterally Abdominal: soft, non-tender, non-distended, no rebound or guarding. Normal bowel sounds. MSK: no edema in left lower extremity. S/p right AKA. Staples intact on R leg, no erythema, warmth, or drainage from incision. Incision with staples clean,  dry, open to air Neurological: alert & oriented to name and situation, 4/5 strength in left upper extremity, 3/5 in right upper extremity. Follows commands.  Skin: warm and dry   Patient Lines/Drains/Airways Status     Active Line/Drains/Airways     Name Placement date Placement time Site Days   Peripheral IV 09/07/22 22 G 1.75" Anterior;Left Forearm 09/07/22  1624  Forearm  1   External Urinary Catheter 09/07/22  1500  --  1   Incision (Closed) 09/07/22 Thigh Anterior;Right 09/07/22  1807  -- 1   Incision (Closed) 09/07/22 Thigh Anterior;Distal;Right 09/07/22  1813  -- 1   Wound / Incision (Open or Dehisced) 07/24/15 Other (Comment) Buttocks Left 3x3x2 07/24/15  1130  Buttocks  2603   Wound / Incision (Open or Dehisced) 07/24/15 Other (Comment) Thigh Right 1x1x1 07/24/15  1130  Thigh  2603   Wound / Incision (Open or Dehisced) 07/28/22 Skin tear;Non-pressure wound;Venous stasis ulcer Foot Anterior;Right;Posterior;Circumferential foot is discolored, red, purple, bursted blister present 07/28/22  0230  Foot  42            Pertinent Labs:    Latest Ref Rng & Units 09/08/2022    3:52 AM 09/07/2022   10:48 AM 08/06/2022    2:09 AM  CBC  WBC 4.0 - 10.5 K/uL 7.2  6.4  9.2   Hemoglobin 13.0 - 17.0 g/dL 12.3  15.9  9.5  Hematocrit 39.0 - 52.0 % 37.1  49.1  29.5   Platelets 150 - 400 K/uL 445  381  307        Latest Ref Rng & Units 09/08/2022    3:52 AM 09/07/2022   10:48 AM 08/06/2022    2:09 AM  CMP  Glucose 70 - 99 mg/dL 116  117  109   BUN 6 - 20 mg/dL 10  14  <5   Creatinine 0.61 - 1.24 mg/dL 0.80  1.32  0.58   Sodium 135 - 145 mmol/L 140  142  138   Potassium 3.5 - 5.1 mmol/L 3.5  3.4  3.8   Chloride 98 - 111 mmol/L 107  103  103   CO2 22 - 32 mmol/L 26  27  29    Calcium 8.9 - 10.3 mg/dL 8.8  9.6  8.7   Total Protein 6.5 - 8.1 g/dL  7.6    Total Bilirubin 0.3 - 1.2 mg/dL  0.6    Alkaline Phos 38 - 126 U/L  50    AST 15 - 41 U/L  12    ALT 0 - 44 U/L  8     Drugs of  Abuse     Component Value Date/Time   LABOPIA NONE DETECTED 09/07/2022 2027   COCAINSCRNUR NONE DETECTED 09/07/2022 2027   COCAINSCRNUR Negative 07/27/2021 1052   LABBENZ NONE DETECTED 09/07/2022 2027   AMPHETMU NONE DETECTED 09/07/2022 2027   THCU NONE DETECTED 09/07/2022 2027   LABBARB NONE DETECTED 09/07/2022 2027     Urinalysis    Component Value Date/Time   COLORURINE AMBER (A) 09/07/2022 2027   APPEARANCEUR HAZY (A) 09/07/2022 2027   LABSPEC 1.028 09/07/2022 2027   PHURINE 5.0 09/07/2022 2027   GLUCOSEU NEGATIVE 09/07/2022 2027   HGBUR NEGATIVE 09/07/2022 2027   BILIRUBINUR SMALL (A) 09/07/2022 2027   KETONESUR 20 (A) 09/07/2022 2027   PROTEINUR 30 (A) 09/07/2022 2027   UROBILINOGEN 0.2 01/15/2013 1538   NITRITE NEGATIVE 09/07/2022 2027   LEUKOCYTESUR NEGATIVE 09/07/2022 2027     No results for input(s): "GLUCAP" in the last 72 hours.   Pertinent Imaging: CT HEAD WO CONTRAST (5MM)  Result Date: 09/07/2022 CLINICAL DATA:  Altered mental status EXAM: CT HEAD WITHOUT CONTRAST TECHNIQUE: Contiguous axial images were obtained from the base of the skull through the vertex without intravenous contrast. RADIATION DOSE REDUCTION: This exam was performed according to the departmental dose-optimization program which includes automated exposure control, adjustment of the mA and/or kV according to patient size and/or use of iterative reconstruction technique. COMPARISON:  CT Head 07/27/22 FINDINGS: Brain: Redemonstrated is a chronic left ACA territory infarct. There is ex vacuo dilatation of the left frontal horn. No hydrocephalus. No hemorrhage. No extra-axial fluid collection. There is sequela of moderate overall chronic microvascular ischemic change. There is a chronic infarct in the corona radiata on the right (series 4, image 16). There is chronic infarct involving the left lentiform nucleus. CT evidence of an acute cortical infarct. Vascular: No hyperdense vessel or unexpected  calcification. Skull: Normal. Negative for fracture or focal lesion. Sinuses/Orbits: No middle ear or mastoid effusion. Paranasal sinuses are clear. Orbits are unremarkable. Other: None. IMPRESSION: 1. No acute intracranial abnormality. 2. Chronic left ACA territory infarct. 3. Moderate overall chronic microvascular ischemic change. Electronically Signed   By: Marin Roberts M.D.   On: 09/07/2022 15:03   Abd 1 View (KUB)  Result Date: 09/07/2022 CLINICAL DATA:  Constipation EXAM: ABDOMEN - 1 VIEW  COMPARISON:  Radiograph 08/04/2022 FINDINGS: No evidence of bowel obstruction. Moderate colonic stool burden. No radiopaque calculi overlie the kidneys. No acute osseous abnormality. IMPRESSION: No evidence of bowel obstruction.  Moderate colonic stool burden. Electronically Signed   By: Maurine Simmering M.D.   On: 09/07/2022 14:54    ASSESSMENT/PLAN:  Assessment: Active Problems:   Acute encephalopathy   Gerald [name] is a 62 y.o. 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 Currently on hospital day 2.  Plan: Acute Toxic metabolic encephalopathy  On exam today mentation is improved and can tell us his name and location, close to his baseline mentation. UA and UDS negative. CT imaging negative for acute infarct, will obtain an MRI today to assess for new infarcts. We will also add an EEG given his seizure history. Will also continue to hold baclofen and robaxin. Dysphagia diet 3 recommended by speech. PT/OT recommend constant supervision at home. -Strict I/Os -Folic acid and thiamine daily -Holding baclofen and robaxin   AKI-resolved Cr elevated at 1.32 on admission, resolving. Creatinine today is 0.8, patient seems to be 0.7 at baseline. Suspect that etiology is prerenal due to his inability to maintain  hydration his CVA deficits. Patient had good response with fluids, will continue to monitor hydration status with strict I/Os.  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 Will resume home medications -Resume home ASA and statin  -Resume home depakote, keppra, and vimpat    Constipation Moderate stool burden noted on abdominal xray. Will start senna and miralax for bowel regimen. 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 Trula Slade. Per patient's wife today they will go get the stitches taken out on April 3rd. Staples appear clean, dry, and intact.   Best Practice: Diet: dysphagia 3 IVF: Fluids: none VTE: enoxaparin (LOVENOX) injection 30 mg Start: 09/08/22 1000 Code: Full Therapy Recs:  constant supervised assistance at home , DME: none Family Contact: wife, at bedside. DISPO: Anticipated discharge tomorrow to Home pending  improved mentation and resolving AKI .  Signature: Marisa Cyphers, Medical Student  Attestation for Student Documentation:  I personally was present and performed or re-performed the history, physical exam and medical decision-making activities of this service and have verified that the service and findings are accurately documented in the student's note.  Alesia Morin, MD 09/08/2022, 11:26 AM   Please contact the on call pager after 5 pm and on weekends at [phone].

## 2022-09-08 NOTE — Progress Notes (Signed)
Physical Therapy Treatment Patient Details Name: Gerald [name] MRN: [id number] DOB: 1961/01/25 Today's Date: 09/08/2022   History of Present Illness Pt is a 62 y.o. male presents to [facility] hospital on 09/07/2022 with AMS and worsening RUE weakness. 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 PMH includes L ACA CVA, HTN, seizure disorder, PAD, alcohol and cocaine abuse.    PT Comments    Pt is making gradual progress with functional mobility, but still requiring extensive assistance of maxAx2 for bed mobility and transfers to stand and pivot between surfaces. When scooting laterally, pt only needed minAx2 though. Per TOC team, pt is now in his copay days for SNF and the family cannot afford it and are planning to take pt home at d/c instead. If pt ends up going home with family support, they could benefit from [facility] services, HHPT follow-up, and a hospital bed and hoyer lift to decrease caregiver burden. Will continue to follow acutely.     Recommendations for follow up therapy are one component of a multi-disciplinary discharge planning process, led by the attending physician.  Recommendations may be updated based on patient status, additional functional criteria and insurance authorization.  Follow Up Recommendations  Can patient physically be transported by private vehicle: No    Assistance Recommended at Discharge Frequent or constant Supervision/Assistance  Patient can return home with the following A lot of help with walking and/or transfers;A lot of help with bathing/dressing/bathroom;Assistance with cooking/housework;Direct supervision/assist for medications management;Direct supervision/assist for financial management;Assist for transportation;Help with stairs or ramp for entrance   Equipment Recommendations  Hospital bed;Other (comment) (hoyer lift)    Recommendations for Other Services       Precautions / Restrictions Precautions Precautions:  Fall Precaution Comments: R AKA, R hemiplegia Restrictions Weight Bearing Restrictions: Yes RLE Weight Bearing: Non weight bearing Other Position/Activity Restrictions: R AKA on 2/16     Mobility  Bed Mobility Overal bed mobility: Needs Assistance Bed Mobility: Rolling, Sidelying to Sit Rolling: Max assist, +2 for physical assistance, +2 for safety/equipment Sidelying to sit: Max assist, +2 for physical assistance, +2 for safety/equipment, HOB elevated       General bed mobility comments: cueing for sequencing, using L UE/LE to assist but overall requiring max assist +2; hand over hand guidance to place L UE on R bed rail to roll with pt quickly transitioning L UE to end of bed rail once sitting upright.    Transfers Overall transfer level: Needs assistance Equipment used: 2 person hand held assist, None Transfers: Sit to/from Stand, Bed to chair/wheelchair/BSC Sit to Stand: Max assist, +2 safety/equipment, +2 physical assistance Stand pivot transfers: Max assist, +2 physical assistance, +2 safety/equipment        Lateral/Scoot Transfers: Min assist, +2 safety/equipment, +2 physical assistance General transfer comment: min assist +2 using bed pad to laterally scoot towards L side with cueing for technique, max assist +2 to stand blocking L knee to pivot to recliner towards pt's L    Ambulation/Gait               General Gait Details: unable   Stairs             Wheelchair Mobility    Modified Rankin (Stroke Patients Only) Modified Rankin (Stroke Patients Only) Pre-Morbid Rankin Score: Moderately severe disability Modified Rankin: Severe disability     Balance Overall balance assessment: Needs assistance Sitting-balance support: No upper extremity supported, Feet supported Sitting balance-Leahy Scale: Fair Sitting balance -  Comments: modA initally, fading to min gaurd/min assist statically and very limited dynamically within base of support.  He prefers  L hand support on bed rail and tends to lean towards R side. Postural control: Posterior lean, Right lateral lean Standing balance support: Bilateral upper extremity supported, During functional activity Standing balance-Leahy Scale: Poor Standing balance comment: MaxAx2 and L knee block to stand                            Cognition Arousal/Alertness: Awake/alert Behavior During Therapy: Anxious Overall Cognitive Status: Difficult to assess Area of Impairment: Orientation, Attention, Memory, Following commands, Safety/judgement, Awareness, Problem solving                 Orientation Level: Disoriented to, Time, Situation Current Attention Level: Focused Memory: Decreased recall of precautions, Decreased short-term memory Following Commands: Follows one step commands with increased time Safety/Judgement: Decreased awareness of safety, Decreased awareness of deficits Awareness: Intellectual Problem Solving: Slow processing, Decreased initiation, Requires verbal cues, Difficulty sequencing, Requires tactile cues General Comments: pt oriented to self and place, with choices for time pt able to correctly select month but not year.  He follows most simple commands but demonstrates some perseveration on prior command, difficulty sequencing and poor problem solving.  No awareness to incontinence of bowel.        Exercises      General Comments        Pertinent Vitals/Pain Pain Assessment Pain Assessment: Faces Faces Pain Scale: Hurts little more Pain Location: R UE Pain Descriptors / Indicators: Discomfort, Grimacing Pain Intervention(s): Monitored during session, Limited activity within patient's tolerance, Repositioned    Home Living                          Prior Function            PT Goals (current goals can now be found in the care plan section) Acute Rehab PT Goals Patient Stated Goal: to return to recent baseline, transfer with less  assistance and improve ability to perform ADLs PT Goal Formulation: With patient/family Time For Goal Achievement: 09/21/22 Potential to Achieve Goals: Fair Progress towards PT goals: Progressing toward goals    Frequency           PT Plan Discharge plan needs to be updated;Equipment recommendations need to be updated    Co-evaluation PT/OT/SLP Co-Evaluation/Treatment: Yes Reason for Co-Treatment: For patient/therapist safety;To address functional/ADL transfers PT goals addressed during session: Mobility/safety with mobility;Balance        AM-PAC PT "6 Clicks" Mobility   Outcome Measure  Help needed turning from your back to your side while in a flat bed without using bedrails?: Total Help needed moving from lying on your back to sitting on the side of a flat bed without using bedrails?: Total Help needed moving to and from a bed to a chair (including a wheelchair)?: Total Help needed standing up from a chair using your arms (e.g., wheelchair or bedside chair)?: Total Help needed to walk in hospital room?: Total Help needed climbing 3-5 steps with a railing? : Total 6 Click Score: 6    End of Session Equipment Utilized During Treatment: Gait belt Activity Tolerance: Patient tolerated treatment well Patient left: in chair;with call bell/phone within reach;with chair alarm set Nurse Communication: Mobility status       Time: [id number] PT Time Calculation (min) (ACUTE ONLY): 27 min  Charges:  $  Therapeutic Activity: 8-22 mins                     Moishe Spice, PT, DPT Acute Rehabilitation Services  Office: [phone]    Orvan Falconer 09/08/2022, 2:24 PM

## 2022-09-08 NOTE — Care Management Obs Status (Signed)
[facility] NOTIFICATION   Patient Details  Name: Gerald [name] MRN: [id number] Date of Birth: [birth year]   Medicare Observation Status Notification Given:  Yes    Geralynn Ochs, LCSW 09/08/2022, 10:01 AM

## 2022-09-08 NOTE — Procedures (Signed)
Patient Name: Gerald [name]  MRN: [id number]  Epilepsy Attending: Lora Havens  Referring Physician/Provider: Rick Duff, MD  Date: 09/08/2022 Duration: 21.36 mins  Patient history: 62yo m with ams. EEG to evaluate for seizure.  Level of alertness: Awake  AEDs during EEG study: LEV, LCM, VPA  Technical aspects: This EEG study was done with scalp electrodes positioned according to the 10-20 International system of electrode placement. Electrical activity was reviewed with band pass filter of 1-70Hz , sensitivity of 7 uV/mm, display speed of 88mm/sec with a 60Hz  notched filter applied as appropriate. EEG data were recorded continuously and digitally stored.  Video monitoring was available and reviewed as appropriate.  Description: EEG showed continuous generalized 3 to 6 Hz theta-delta slowing.  Hyperventilation and photic stimulation were not performed.     ABNORMALITY - Continuous slow, generalized  IMPRESSION: This study is suggestive of moderate diffuse encephalopathy, nonspecific etiology. No seizures or epileptiform discharges were seen throughout the recording.  Please note lack of epileptiform activity during interictal EEG does not exclude the diagnosis of epilepsy.  [name] [name]

## 2022-09-08 NOTE — Progress Notes (Signed)
MD notified that patient does not have IV access.

## 2022-09-08 NOTE — Progress Notes (Signed)
EEG complete - results pending 
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
## 2022-09-08 NOTE — Evaluation (Signed)
Occupational Therapy Evaluation Patient Details Name: Gerald [name] MRN: [id number] DOB: Dec 30, 1960 Today's Date: 09/08/2022   History of Present Illness Pt is a 62 y.o. male presents to [facility] hospital on 09/07/2022 with AMS and worsening RUE weakness. 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 PMH includes L ACA CVA, HTN, seizure disorder, PAD, alcohol and cocaine abuse.   Clinical Impression   PTA patient reports needing assist for ADLs and transfers from spouse.  Admitted for above and presents with problem list below, including impaired balance, fear of falling, impaired cognition and communication, R sided hemiparesis with increased tone to UE and recent BKA to LE.  He requires max assist +2 for bed mobility, stand pivot transfers but able to complete lateral scoots with min assist +2.  He requires min to total assist +2 for ADLs, unaware of bowel incontinence upon entry into room.  Believe he will best benefit from continued OT services acutely and after dc to optimize independence, safety and decrease burden of care. Will follow.      Recommendations for follow up therapy are one component of a multi-disciplinary discharge planning process, led by the attending physician.  Recommendations may be updated based on patient status, additional functional criteria and insurance authorization.   Assistance Recommended at Discharge Frequent or constant Supervision/Assistance  Patient can return home with the following Two people to help with walking and/or transfers;Two people to help with bathing/dressing/bathroom;Direct supervision/assist for medications management;Direct supervision/assist for financial management;Assist for transportation;Help with stairs or ramp for entrance;Assistance with feeding;Assistance with cooking/housework    Functional Status Assessment  Patient has had a recent decline in their functional status and demonstrates the ability to  make significant improvements in function in a reasonable and predictable amount of time.  Equipment Recommendations  Other (comment);Hospital bed (hoyer lift)    Recommendations for Other Services       Precautions / Restrictions Precautions Precautions: Fall Precaution Comments: R AKA, R hemiplegia Restrictions Weight Bearing Restrictions: Yes RLE Weight Bearing: Non weight bearing Other Position/Activity Restrictions: R AKA on 2/16      Mobility Bed Mobility Overal bed mobility: Needs Assistance Bed Mobility: Rolling, Sidelying to Sit Rolling: Max assist, +2 for physical assistance, +2 for safety/equipment Sidelying to sit: Max assist, +2 for physical assistance, +2 for safety/equipment, HOB elevated       General bed mobility comments: cueing for sequencing, using L UE/LE to assist but overall requiring max assist +2    Transfers Overall transfer level: Needs assistance   Transfers: Sit to/from Stand, Bed to chair/wheelchair/BSC Sit to Stand: Max assist, +2 safety/equipment, +2 physical assistance Stand pivot transfers: Max assist, +2 physical assistance, +2 safety/equipment        Lateral/Scoot Transfers: Min assist, +2 safety/equipment, +2 physical assistance General transfer comment: min assist +2 to laterally scoot towards L side with cueing for technique, max assist +2 to stand blocking L knee to pivot to recliner      Balance Overall balance assessment: Needs assistance Sitting-balance support: No upper extremity supported, Feet supported Sitting balance-Leahy Scale: Fair Sitting balance - Comments: modA initally, fading to min gaurd/min assist statically and very limited dynamically within base of support.  He prefers L hand support on bed rail and tends to lean towards R side. Postural control: Posterior lean, Right lateral lean Standing balance support: Bilateral upper extremity supported, During functional activity Standing balance-Leahy Scale: Poor  ADL either performed or assessed with clinical judgement   ADL Overall ADL's : Needs assistance/impaired     Grooming: Moderate assistance;Sitting           Upper Body Dressing : Maximal assistance;Sitting   Lower Body Dressing: Total assistance;+2 for physical assistance;+2 for safety/equipment;Sit to/from stand;Sitting/lateral leans   Toilet Transfer: Maximal assistance;+2 for physical assistance;+2 for safety/equipment;Stand-pivot Toilet Transfer Details (indicate cue type and reason): simulated to recliner Toileting- Clothing Manipulation and Hygiene: Total assistance;+2 for physical assistance;+2 for safety/equipment;Bed level Toileting - Clothing Manipulation Details (indicate cue type and reason): incontinent of bowel in bed     Functional mobility during ADLs: Maximal assistance;+2 for physical assistance;+2 for safety/equipment;Cueing for safety;Cueing for sequencing       Vision   Additional Comments: appears WFL, further assessment as needed     Perception     Praxis      Pertinent Vitals/Pain Pain Assessment Pain Assessment: Faces Faces Pain Scale: Hurts little more Pain Location: R UE Pain Descriptors / Indicators: Discomfort, Grimacing Pain Intervention(s): Limited activity within patient's tolerance, Monitored during session, Repositioned     Hand Dominance Right   Extremity/Trunk Assessment Upper Extremity Assessment Upper Extremity Assessment: RUE deficits/detail;LUE deficits/detail RUE Deficits / Details: pt with chronic R hemiplegia. Pt reports pain in R arm, hypertonicity noted with shoulder elevation and rigid posture of shoulder and hand.  Very limited assessmend due to pain but pt able to actively open hand given increased time, otherwise unable to really activate UE.  Passively able to range shoudler minimally, and extend elbow to approx 165* and 90* flexion. RUE: Unable to fully assess due to pain LUE  Deficits / Details: Grossly 3+/5 MMT   Lower Extremity Assessment Lower Extremity Assessment: Defer to PT evaluation   Cervical / Trunk Assessment Cervical / Trunk Assessment: Other exceptions Cervical / Trunk Exceptions: pt with tight R upper trap leading to hiked shoulder and mild L lateral trunk lean   Communication Communication Communication: Receptive difficulties;Expressive difficulties   Cognition Arousal/Alertness: Awake/alert Behavior During Therapy: Anxious Overall Cognitive Status: Difficult to assess Area of Impairment: Orientation, Attention, Memory, Following commands, Safety/judgement, Awareness, Problem solving                 Orientation Level: Disoriented to, Time, Situation Current Attention Level: Focused Memory: Decreased recall of precautions, Decreased short-term memory Following Commands: Follows one step commands with increased time Safety/Judgement: Decreased awareness of safety, Decreased awareness of deficits Awareness: Intellectual Problem Solving: Slow processing, Decreased initiation, Requires verbal cues, Difficulty sequencing, Requires tactile cues General Comments: pt oriented to self and place, with choices for time pt able to correctly select month but not year.  He follows most simple commands but demonstrates some perseveration on prior command, difficulty sequencing and poor problem solving.  No awareness to incontinence of bowel.     General Comments  VSS on RA    Exercises     Shoulder Instructions      Home Living Family/patient expects to be discharged to:: Skilled nursing facility Living Arrangements: Spouse/significant other Available Help at Discharge: Family;Available 24 hours/day Type of Home: Apartment Home Access: Ramped entrance     Home Layout: One level     Bathroom Shower/Tub: Teacher, early years/pre: Handicapped height     Home Equipment: Conservation officer, nature (2 wheels);BSC/3in1;Wheelchair -  manual;Shower seat;Grab bars - tub/shower;Grab bars - toilet;Hand held shower head;Other (comment) (stedy)          Prior Functioning/Environment Prior Level  of Function : Needs assist             Mobility Comments: transfers to wheelchair with assistance from spouse, able to mobilize in wheelchair at supervision level at the end of rehab stay prior to decline at home. ADLs Comments: Pt has been able to dress himself, participate in bathing and assist with most ADLs while in SNF recently, since has required maxA-totalA for most ADLs at home        OT Problem List: Decreased strength;Decreased activity tolerance;Impaired balance (sitting and/or standing);Pain;Impaired UE functional use;Cardiopulmonary status limiting activity;Decreased knowledge of precautions;Decreased knowledge of use of DME or AE;Decreased safety awareness;Decreased cognition;Decreased coordination;Decreased range of motion      OT Treatment/Interventions: Self-care/ADL training;Neuromuscular education;DME and/or AE instruction;Cognitive remediation/compensation;Therapeutic activities;Visual/perceptual remediation/compensation;Patient/family education;Balance training    OT Goals(Current goals can be found in the care plan section) Acute Rehab OT Goals Patient Stated Goal: get better OT Goal Formulation: With patient Time For Goal Achievement: 09/22/22 Potential to Achieve Goals: Good  OT Frequency: Min 2X/week    Co-evaluation PT/OT/SLP Co-Evaluation/Treatment: Yes Reason for Co-Treatment: For patient/therapist safety;To address functional/ADL transfers   OT goals addressed during session: ADL's and self-care      AM-PAC OT "6 Clicks" Daily Activity     Outcome Measure Help from another person eating meals?: A Lot Help from another person taking care of personal grooming?: A Lot Help from another person toileting, which includes using toliet, bedpan, or urinal?: Total Help from another person bathing  (including washing, rinsing, drying)?: A Lot Help from another person to put on and taking off regular upper body clothing?: A Lot Help from another person to put on and taking off regular lower body clothing?: Total 6 Click Score: 10   End of Session Equipment Utilized During Treatment: Gait belt Nurse Communication: Mobility status;Precautions  Activity Tolerance: Patient tolerated treatment well Patient left: in chair;with call bell/phone within reach;with chair alarm set;with family/visitor present  OT Visit Diagnosis: Other abnormalities of gait and mobility (R26.89);Muscle weakness (generalized) (M62.81);Pain;Hemiplegia and hemiparesis;Cognitive communication deficit (R41.841);Other symptoms and signs involving cognitive function Hemiplegia - Right/Left: Right Hemiplegia - dominant/non-dominant: Dominant Hemiplegia - caused by: Cerebral infarction (chronic) Pain - Right/Left: Right Pain - part of body: Arm                Time: [id number] OT Time Calculation (min): 25 min Charges:  OT General Charges $OT Visit: 1 Visit OT Evaluation $OT Eval Moderate Complexity: 1 Mod  Jolaine Artist, OT Acute Rehabilitation Services Office [phone]   Delight Stare 09/08/2022, 11:04 AM

## 2022-09-08 NOTE — Progress Notes (Signed)
Mobility Specialist: Progress Note   09/08/22 1009  Mobility  Activity Transferred from chair to bed  Level of Assistance +2 (takes two people)  Assistive Device None  RLE Weight Bearing NWB  Activity Response Tolerated well  $Mobility charge 1 Mobility   Assisted pt back to bed per request with NT. MaxA with lateral scoot to his Lt to get back into bed. Cues for hand placement and direction. Required physical assist to maintain upright posture during transfer. No c/o throughout. Pt is back in the bed with call bell and phone at his side. Bed alarm is on.   [name] Mobility Specialist Please contact via SecureChat or Rehab office at [phone]

## 2022-09-09 DIAGNOSIS — F17210 Nicotine dependence, cigarettes, uncomplicated: Secondary | ICD-10-CM | POA: Diagnosis present

## 2022-09-09 DIAGNOSIS — I739 Peripheral vascular disease, unspecified: Secondary | ICD-10-CM | POA: Diagnosis present

## 2022-09-09 DIAGNOSIS — I69320 Aphasia following cerebral infarction: Secondary | ICD-10-CM | POA: Diagnosis not present

## 2022-09-09 DIAGNOSIS — E860 Dehydration: Secondary | ICD-10-CM | POA: Diagnosis present

## 2022-09-09 DIAGNOSIS — K5900 Constipation, unspecified: Secondary | ICD-10-CM | POA: Diagnosis present

## 2022-09-09 DIAGNOSIS — I69322 Dysarthria following cerebral infarction: Secondary | ICD-10-CM | POA: Diagnosis not present

## 2022-09-09 DIAGNOSIS — E876 Hypokalemia: Secondary | ICD-10-CM | POA: Diagnosis present

## 2022-09-09 DIAGNOSIS — E440 Moderate protein-calorie malnutrition: Secondary | ICD-10-CM | POA: Diagnosis present

## 2022-09-09 DIAGNOSIS — I10 Essential (primary) hypertension: Secondary | ICD-10-CM | POA: Diagnosis present

## 2022-09-09 DIAGNOSIS — Z8614 Personal history of Methicillin resistant Staphylococcus aureus infection: Secondary | ICD-10-CM | POA: Diagnosis not present

## 2022-09-09 DIAGNOSIS — G8111 Spastic hemiplegia affecting right dominant side: Secondary | ICD-10-CM | POA: Diagnosis not present

## 2022-09-09 DIAGNOSIS — Z89611 Acquired absence of right leg above knee: Secondary | ICD-10-CM | POA: Diagnosis not present

## 2022-09-09 DIAGNOSIS — G9341 Metabolic encephalopathy: Secondary | ICD-10-CM | POA: Diagnosis present

## 2022-09-09 DIAGNOSIS — Z8249 Family history of ischemic heart disease and other diseases of the circulatory system: Secondary | ICD-10-CM | POA: Diagnosis not present

## 2022-09-09 DIAGNOSIS — Y92009 Unspecified place in unspecified non-institutional (private) residence as the place of occurrence of the external cause: Secondary | ICD-10-CM | POA: Diagnosis not present

## 2022-09-09 DIAGNOSIS — N179 Acute kidney failure, unspecified: Secondary | ICD-10-CM | POA: Diagnosis present

## 2022-09-09 DIAGNOSIS — T428X5A Adverse effect of antiparkinsonism drugs and other central muscle-tone depressants, initial encounter: Secondary | ICD-10-CM | POA: Diagnosis present

## 2022-09-09 DIAGNOSIS — I69351 Hemiplegia and hemiparesis following cerebral infarction affecting right dominant side: Secondary | ICD-10-CM | POA: Diagnosis not present

## 2022-09-09 DIAGNOSIS — G928 Other toxic encephalopathy: Secondary | ICD-10-CM | POA: Diagnosis present

## 2022-09-09 DIAGNOSIS — Z823 Family history of stroke: Secondary | ICD-10-CM | POA: Diagnosis not present

## 2022-09-09 DIAGNOSIS — Z7982 Long term (current) use of aspirin: Secondary | ICD-10-CM | POA: Diagnosis not present

## 2022-09-09 DIAGNOSIS — Z23 Encounter for immunization: Secondary | ICD-10-CM | POA: Diagnosis present

## 2022-09-09 DIAGNOSIS — G40909 Epilepsy, unspecified, not intractable, without status epilepticus: Secondary | ICD-10-CM | POA: Diagnosis present

## 2022-09-09 LAB — BASIC METABOLIC PANEL
Anion gap: 8 (ref 5–15)
BUN: 5 mg/dL — ABNORMAL LOW (ref 6–20)
CO2: 26 mmol/L (ref 22–32)
Calcium: 8.6 mg/dL — ABNORMAL LOW (ref 8.9–10.3)
Chloride: 108 mmol/L (ref 98–111)
Creatinine, Ser: 0.6 mg/dL — ABNORMAL LOW (ref 0.61–1.24)
GFR, Estimated: >60 mL/min (ref >60)
Glucose, Bld: 108 mg/dL — ABNORMAL HIGH (ref 70–99)
Potassium: 3.4 mmol/L — ABNORMAL LOW (ref 3.5–5.1)
Sodium: 142 mmol/L (ref 135–145)

## 2022-09-09 LAB — CBC
HCT: 37.1 % — ABNORMAL LOW (ref 39.0–52.0)
Hemoglobin: 11.9 g/dL — ABNORMAL LOW (ref 13.0–17.0)
MCH: 32.2 pg (ref 26.0–34.0)
MCHC: 32.1 g/dL (ref 30.0–36.0)
MCV: 100.3 fL — ABNORMAL HIGH (ref 80.0–100.0)
Platelets: 281 10*3/uL (ref 150–400)
RBC: 3.7 MIL/uL — ABNORMAL LOW (ref 4.22–5.81)
RDW: 13.1 % (ref 11.5–15.5)
WBC: 6.3 10*3/uL (ref 4.0–10.5)
nRBC: 0 % (ref 0.0–0.2)

## 2022-09-09 LAB — FOLATE: Folate: 6.4 ng/mL (ref >5.9)

## 2022-09-09 LAB — VITAMIN B12: Vitamin B-12: 369 pg/mL (ref 180–914)

## 2022-09-09 MED ORDER — POTASSIUM CHLORIDE CRYS ER 10 MEQ PO TBCR
10.0000 meq | EXTENDED_RELEASE_TABLET | Freq: Once | ORAL | Status: AC
  Administered 2022-09-09: 10 meq via ORAL
  Filled 2022-09-09: qty 1

## 2022-09-09 NOTE — Progress Notes (Signed)
Occupational Therapy Treatment Patient Details Name: SIMON OHLINGER MRN: [id number] DOB: Oct 20, 1960 Today's Date: 09/09/2022   History of present illness Pt is a 62 y.o. male presents to [facility] hospital on 09/07/2022 with AMS and worsening RUE weakness. 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 PMH includes L ACA CVA, HTN, seizure disorder, PAD, alcohol and cocaine abuse.   OT comments  Patient up in wheelchair upon entry.  Worked on self care sitting at sink, completing grooming with supervision.  Discussed safety, DME recommendations and ADL completion at home. Provided PROM and gentle stretch to R UE shoulder girdle to hand, educated on SROM exercise for R UE.  Updated dc recommendation, as pt plans to dc home (unable to afford SNF) and recommend aide for support at home.    Recommendations for follow up therapy are one component of a multi-disciplinary discharge planning process, led by the attending physician.  Recommendations may be updated based on patient status, additional functional criteria and insurance authorization.    Assistance Recommended at Discharge Frequent or constant Supervision/Assistance  Patient can return home with the following  Direct supervision/assist for medications management;Direct supervision/assist for financial management;Assist for transportation;Help with stairs or ramp for entrance;Assistance with feeding;Assistance with cooking/housework;A lot of help with walking and/or transfers;A lot of help with bathing/dressing/bathroom   Equipment Recommendations  Other (comment);Hospital bed (hoyer lift, drop arm BSC)    Recommendations for Other Services      Precautions / Restrictions Precautions Precautions: Fall Precaution Comments: R AKA, R hemiplegia Restrictions Weight Bearing Restrictions: Yes RLE Weight Bearing: Non weight bearing Other Position/Activity Restrictions: R AKA on 2/16       Mobility Bed  Mobility               General bed mobility comments: OOB upon entry    Transfers                   General transfer comment: in w/c     Balance Overall balance assessment: Needs assistance Sitting-balance support: No upper extremity supported, Feet supported Sitting balance-Leahy Scale: Fair                                     ADL either performed or assessed with clinical judgement   ADL Overall ADL's : Needs assistance/impaired     Grooming: Wash/dry face;Oral care;Sitting;Supervision/safety Grooming Details (indicate cue type and reason): increased time, sitting in wc at sink. pt able to manage toothbrush/toothpaste but min cueing required for sequencing         Upper Body Dressing : Moderate assistance;Sitting         Toilet Transfer Details (indicate cue type and reason): in wc upon entry                Extremity/Trunk Assessment              Vision       Perception     Praxis      Cognition Arousal/Alertness: Awake/alert Behavior During Therapy: Anxious Overall Cognitive Status: Difficult to assess Area of Impairment: Attention, Following commands, Safety/judgement, Memory, Awareness, Problem solving                   Current Attention Level: Sustained Memory: Decreased short-term memory Following Commands: Follows one step commands consistently, Follows one step commands with increased time, Follows multi-step commands inconsistently Safety/Judgement:  Decreased awareness of deficits, Decreased awareness of safety Awareness: Emergent Problem Solving: Slow processing, Decreased initiation, Difficulty sequencing, Requires verbal cues General Comments: patient following simple commands with increased time, able to sustain attention to ADL task.  decreased recall noted        Exercises Exercises: Other exercises Other Exercises Other Exercises: PROM and gentle stretch to R UE and shoulder girdle as  tolerated, educated on SROM to shoudler flexion/extension to prevent further tightness to UE    Shoulder Instructions       General Comments discussed safety with dc home/ADL setup    Pertinent Vitals/ Pain       Pain Assessment Pain Assessment: Faces Faces Pain Scale: Hurts little more Pain Location: R UE with ROM Pain Descriptors / Indicators: Discomfort, Grimacing Pain Intervention(s): Limited activity within patient's tolerance, Monitored during session, Repositioned  Home Living                                          Prior Functioning/Environment              Frequency  Min 2X/week        Progress Toward Goals  OT Goals(current goals can now be found in the care plan section)  Progress towards OT goals: Progressing toward goals  Acute Rehab OT Goals Patient Stated Goal: home OT Goal Formulation: With patient Time For Goal Achievement: 09/22/22 Potential to Achieve Goals: Good  Plan Frequency remains appropriate;Discharge plan needs to be updated    Co-evaluation                 AM-PAC OT "6 Clicks" Daily Activity     Outcome Measure   Help from another person eating meals?: A Little Help from another person taking care of personal grooming?: A Little Help from another person toileting, which includes using toliet, bedpan, or urinal?: Total Help from another person bathing (including washing, rinsing, drying)?: A Lot Help from another person to put on and taking off regular upper body clothing?: A Lot Help from another person to put on and taking off regular lower body clothing?: Total 6 Click Score: 12    End of Session    OT Visit Diagnosis: Other abnormalities of gait and mobility (R26.89);Muscle weakness (generalized) (M62.81);Pain;Hemiplegia and hemiparesis;Cognitive communication deficit (R41.841);Other symptoms and signs involving cognitive function Hemiplegia - Right/Left: Right Hemiplegia - dominant/non-dominant:  Dominant 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

## 2022-09-09 NOTE — Progress Notes (Signed)
    Durable Medical Equipment  (From admission, onward)           Start     Ordered   09/09/22 1025  For home use only DME Bedside commode  Once       Comments: Needs to be a drop arm  Question:  Patient needs a bedside commode to treat with the following condition  Answer:  Weakness   09/09/22 1025   09/09/22 0838  For home use only DME Other see comment  Once       Comments: [name] lift  Question:  Length of Need  Answer:  6 Months   09/09/22 0837   09/09/22 0836  For home use only DME Hospital bed  Once       Question Answer Comment  Length of Need 6 Months   Patient has (list medical condition): prior CVA, right AKA   The above medical condition requires: Patient requires the ability to reposition frequently   Bed type Semi-electric      09/09/22 0837   09/08/22 1641  For home use only DME Hospital bed  Once       Question Answer Comment  Length of Need 6 Months   The above medical condition requires: Patient requires the ability to reposition frequently   Head must be elevated greater than: 30 degrees   Bed type Semi-electric   Hoyer Lift Yes      09/08/22 1640            Pt requires a drop arm bedside commode as he is unable to make it to the home restroom. He needs the drop arm as family will have to transfer him to the [facility] or use a lift and can not go around the arm of the chair.

## 2022-09-09 NOTE — Progress Notes (Signed)
Physical Therapy Treatment Patient Details Name: Gerald [name] MRN: [id number] DOB: 07-22-1960 Today's Date: 09/09/2022   History of Present Illness Pt is a 62 y.o. male presents to [facility] hospital on 09/07/2022 with AMS and worsening RUE weakness. 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 PMH includes L ACA CVA, HTN, seizure disorder, PAD, alcohol and cocaine abuse.    PT Comments    Pt is demonstrating improved ability to follow commands and assist with functional mobility, only needing modA for bed mobility and lateral scoot and squat pivot transfers to his L today. Pt did require minA to navigate his w/c around obstacles and in tighter spaces. Educated pt and wife on use of hoyer lift, lateral scooting transfer techniques with/without a sliding board, and assisting pt with bed mobility with use of bed controls. Will continue to follow acutely.     Recommendations for follow up therapy are one component of a multi-disciplinary discharge planning process, led by the attending physician.  Recommendations may be updated based on patient status, additional functional criteria and insurance authorization.  Follow Up Recommendations  Can patient physically be transported by private vehicle: No    Assistance Recommended at Discharge Frequent or constant Supervision/Assistance  Patient can return home with the following A lot of help with walking and/or transfers;A lot of help with bathing/dressing/bathroom;Assistance with cooking/housework;Direct supervision/assist for medications management;Direct supervision/assist for financial management;Assist for transportation;Help with stairs or ramp for entrance   Equipment Recommendations  Hospital bed;Other (comment);BSC/3in1 (hoyer lift; drop arm BSC)    Recommendations for Other Services       Precautions / Restrictions Precautions Precautions: Fall Precaution Comments: R AKA, R  hemiplegia Restrictions Weight Bearing Restrictions: Yes RLE Weight Bearing: Non weight bearing Other Position/Activity Restrictions: R AKA on 2/16     Mobility  Bed Mobility Overal bed mobility: Needs Assistance Bed Mobility: Supine to Sit, Rolling Rolling: Mod assist   Supine to sit: Mod assist, HOB elevated     General bed mobility comments: Pt able to reach L UE to R bed rail and bring L leg across body to roll to R with modA. Pt using bed rail with L UE to pull up to sit up with support at his trunk and pt scooting legs off EOB. Bed pad used to scoot his hips to EOB.    Transfers Overall transfer level: Needs assistance Equipment used: None Transfers: Bed to chair/wheelchair/BSC       Squat pivot transfers: Mod assist    Lateral/Scoot Transfers: Mod assist General transfer comment: ModA using bed pad to scoot to L along EOB 1x and then perform squat pivot transfer to his L from elevated EOB to w/c with arm rest dropped, cuing pt to reach for distal arm rest with L UE to assist with transfer. L knee blocked for safety    Ambulation/Gait               General Gait Details: unable   Theme park manager mobility: Yes Wheelchair propulsion: Left upper extremity, Left lower extremity Wheelchair parts: Needs assistance Distance: 85 ft Wheelchair Assistance Details (indicate cue type and reason): Donned L shoe and pt needing cues to also use his L UE on the wheel rim to assist his L leg with propelling. MinA for turning intermittently and to negotiate through doorways and tight spaces. Cues provided to pull back on wheel  with L UE to create a tighter turn  Modified Rankin (Stroke Patients Only) Modified Rankin (Stroke Patients Only) Pre-Morbid Rankin Score: Moderately severe disability Modified Rankin: Severe disability     Balance Overall balance assessment: Needs assistance Sitting-balance support: No  upper extremity supported, Feet supported Sitting balance-Leahy Scale: Fair Sitting balance - Comments: Improved sitting balance, minA initially and fading to min guard assist. Pt prefers L UE support                                    Cognition Arousal/Alertness: Awake/alert Behavior During Therapy: Anxious Overall Cognitive Status: Difficult to assess Area of Impairment: Attention, Memory, Following commands, Safety/judgement, Awareness, Problem solving                   Current Attention Level: Sustained Memory: Decreased short-term memory Following Commands: Follows one step commands consistently, Follows one step commands with increased time, Follows multi-step commands inconsistently Safety/Judgement: Decreased awareness of safety, Decreased awareness of deficits Awareness: Emergent Problem Solving: Slow processing, Decreased initiation, Difficulty sequencing, Requires verbal cues General Comments: patient following simple commands with increased time, able to sustain attention to tasks.        Exercises      General Comments General comments (skin integrity, edema, etc.): educated pt and wife on use of hoyer lift, lateral scooting transfer techniques with/without a sliding board, and assisting pt with bed mobility with use of bed controls      Pertinent Vitals/Pain Pain Assessment Pain Assessment: Faces Faces Pain Scale: Hurts little more Pain Location: R UE Pain Descriptors / Indicators: Discomfort, Grimacing, Guarding Pain Intervention(s): Limited activity within patient's tolerance, Monitored during session, Repositioned    Home Living                          Prior Function            PT Goals (current goals can now be found in the care plan section) Acute Rehab PT Goals Patient Stated Goal: to return to recent baseline, transfer with less assistance and improve ability to perform ADLs PT Goal Formulation: With  patient/family Time For Goal Achievement: 09/21/22 Potential to Achieve Goals: Fair Progress towards PT goals: Progressing toward goals    Frequency    Min 3X/week      PT Plan Equipment recommendations need to be updated    Co-evaluation              AM-PAC PT "6 Clicks" Mobility   Outcome Measure  Help needed turning from your back to your side while in a flat bed without using bedrails?: A Lot Help needed moving from lying on your back to sitting on the side of a flat bed without using bedrails?: A Lot Help needed moving to and from a bed to a chair (including a wheelchair)?: A Lot Help needed standing up from a chair using your arms (e.g., wheelchair or bedside chair)?: Total Help needed to walk in hospital room?: Total Help needed climbing 3-5 steps with a railing? 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

## 2022-09-09 NOTE — Discharge Instructions (Addendum)
Dear Mr. Tapp, it has been a pleasure caring for you and I am so happy to see you doing well! You were hospitalized for confusion and treated for dehydration.  We also continued a lot of your home medications to manage your seizures. In addition to medications, you were also seen by PT and OT.   When you are discharged we would like you to do the following:  Continue taking your medications as you were before you came to the hospital.  Please stop taking your Baclofen and Robaxin.  3.   Please go to your scheduled appointments:  Vascular surgery appointment for staple removal on 09/15/2022 at 1:15 pm.  Internal medicine clinic for hospital follow-up on 09/21/2022 at 3:45pm with Dr. Dema Severin  Neurology appointment with Amy Lomax on 10/27/2022 at 8:30 am.

## 2022-09-09 NOTE — Progress Notes (Addendum)
HD#1 SUBJECTIVE:  Patient Summary: Gerald [name] is a 62 y.o. 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  Overnight Events: NAEON. Patient is more alert and greeted the team as we came to see him. He was able to respond yes or no to questions and was able to state his full name and his location. Per patient's wife, he is doing better today. She also reports that patient has been able to eat without swallowing.    OBJECTIVE:  Vital Signs: Vitals:   09/08/22 1543 09/08/22 1941 09/08/22 2356 09/09/22 0331  BP: 115/69 120/80 96/65 117/79  Pulse: 89 99 86 89  Resp: 18 20 20 20   Temp: (!) 97.4 F (36.3 C) 98.4 F (36.9 C) 97.6 F (36.4 C) 97.8 F (36.6 C)  TempSrc: Oral Oral Oral Oral  SpO2: 96% 94% 95% 94%  Weight:      Height:       Supplemental O2: Room Air SpO2: 94 %  Filed Weights   09/07/22 1300  Weight: 88.4 kg     Intake/Output Summary (Last 24 hours) at 09/09/2022 [id number] Last data filed at 09/08/2022 1600 Gross per 24 hour  Intake 480 ml  Output 400 ml  Net 80 ml    Net IO Since Admission: 2,080 mL [09/09/22 0713]  Physical Exam: Physical Exam Constitutional: alert male sitting in bed, in no acute distress HENT: normocephalic atraumatic, mucous membranes moist Cardiovascular: regular rate and rhythm, no m/r/g Pulmonary/Chest: normal work of breathing on room air Abdominal: soft, non-tender, non-distended. Normal bowel sounds. MSK: no edema in left lower extremity. S/p right AKA. Staples intact on R leg, no erythema, warmth, or drainage from incision. Incision with staples clean, dry, open to air Neurological: alert & oriented to name and location, 4/5 strength in left upper extremity, 3/5 in right upper extremity. Strength 4/5 in left lower extremity.  Follows commands.  Skin: warm and dry   Patient Lines/Drains/Airways Status     Active Line/Drains/Airways     Name Placement date Placement time Site Days   Peripheral IV 09/07/22 22 G 1.75" Anterior;Left Forearm 09/07/22  1624  Forearm  1   External Urinary Catheter 09/07/22  1500  --  1   Incision (Closed) 09/07/22 Thigh Anterior;Right 09/07/22  1807  -- 1   Incision (Closed) 09/07/22 Thigh Anterior;Distal;Right 09/07/22  1813  -- 1   Wound / Incision (Open or Dehisced) 07/24/15 Other (Comment) Buttocks Left 3x3x2 07/24/15  1130  Buttocks  2603   Wound / Incision (Open or Dehisced) 07/24/15 Other (Comment) Thigh Right 1x1x1 07/24/15  1130  Thigh  2603   Wound / Incision (Open or Dehisced) 07/28/22 Skin tear;Non-pressure wound;Venous stasis ulcer Foot Anterior;Right;Posterior;Circumferential foot is discolored, red, purple, bursted blister present 07/28/22  0230  Foot  42            Pertinent Labs:    Latest Ref Rng & Units 09/08/2022    3:52 AM 09/07/2022   10:48 AM 08/06/2022    2:09 AM  CBC  WBC 4.0 - 10.5 K/uL 7.2  6.4  9.2   Hemoglobin 13.0 - 17.0 g/dL 12.3  15.9  9.5   Hematocrit 39.0 - 52.0 % 37.1  49.1  29.5   Platelets 150 - 400 K/uL 445  381  307        Latest Ref Rng & Units 09/08/2022    3:52 AM 09/07/2022   10:48 AM 08/06/2022    2:09 AM  CMP  Glucose 70 - 99 mg/dL 116  117  109   BUN 6 - 20 mg/dL 10  14  <5   Creatinine 0.61 - 1.24 mg/dL 0.80  1.32  0.58   Sodium 135 - 145 mmol/L 140  142  138   Potassium 3.5 - 5.1 mmol/L 3.5  3.4  3.8   Chloride 98 - 111 mmol/L 107  103  103   CO2 22 - 32 mmol/L 26  27  29    Calcium 8.9 - 10.3 mg/dL 8.8  9.6  8.7   Total Protein 6.5 - 8.1 g/dL  7.6    Total Bilirubin 0.3 - 1.2 mg/dL  0.6    Alkaline Phos 38 - 126 U/L  50    AST 15 - 41 U/L  12    ALT 0 - 44 U/L  8     Drugs of Abuse     Component Value Date/Time   LABOPIA NONE DETECTED 09/07/2022 2027   COCAINSCRNUR NONE DETECTED 09/07/2022 2027   COCAINSCRNUR  Negative 07/27/2021 1052   LABBENZ NONE DETECTED 09/07/2022 2027   AMPHETMU NONE DETECTED 09/07/2022 2027   THCU NONE DETECTED 09/07/2022 2027   LABBARB NONE DETECTED 09/07/2022 2027     Urinalysis    Component Value Date/Time   COLORURINE AMBER (A) 09/07/2022 2027   APPEARANCEUR HAZY (A) 09/07/2022 2027   LABSPEC 1.028 09/07/2022 2027   PHURINE 5.0 09/07/2022 2027   GLUCOSEU NEGATIVE 09/07/2022 2027   HGBUR NEGATIVE 09/07/2022 2027   BILIRUBINUR SMALL (A) 09/07/2022 2027   KETONESUR 20 (A) 09/07/2022 2027   PROTEINUR 30 (A) 09/07/2022 2027   UROBILINOGEN 0.2 01/15/2013 1538   NITRITE NEGATIVE 09/07/2022 2027   LEUKOCYTESUR NEGATIVE 09/07/2022 2027     No results for input(s): "GLUCAP" in the last 72 hours.   Pertinent Imaging: MR BRAIN WO CONTRAST  Result Date: 09/08/2022 CLINICAL DATA:  Stroke suspected, altered mental status EXAM: MRI HEAD WITHOUT CONTRAST TECHNIQUE: Multiplanar, multiecho pulse sequences of the brain and surrounding structures were obtained without intravenous contrast. COMPARISON:  05/27/2022 MRI head, correlation is also made with CT head 09/07/2022 FINDINGS: Brain: No restricted diffusion to suggest acute or subacute infarct. No acute hemorrhage, mass, mass effect, or midline shift. No hydrocephalus or extra-axial collection. Normal pituitary and craniocervical junction. Redemonstrated chronic left ACA territory infarct, with encephalomalacia and ex vacuo dilatation left frontal horn. Confluent T2 hyperintense signal in the periventricular white matter, likely the sequela of severe chronic small vessel ischemic disease. Vascular: Normal arterial flow voids. Skull and upper cervical spine: Normal marrow signal. Sinuses/Orbits: Overall clear paranasal sinuses. No acute finding in the orbits. Other: Trace fluid in the right mastoid air cells.  IMPRESSION: No acute intracranial process. No evidence of acute or subacute infarct. Electronically Signed   By: Merilyn Baba M.D.   On: 09/08/2022 19:44   EEG adult  Result Date: 09/08/2022 Lora Havens, MD     09/08/2022  1:22 PM Patient Name: Gerald [name] MRN: [id number] Epilepsy Attending: Lora Havens Referring Physician/Provider: Rick Duff, MD Date: 09/08/2022 Duration: 21.36 mins Patient history: 62yo m with ams. EEG to evaluate for seizure. Level of alertness: Awake AEDs during EEG study: LEV, LCM, VPA Technical aspects: This EEG study was done with scalp electrodes positioned according to  the 10-20 International system of electrode placement. Electrical activity was reviewed with band pass filter of 1-70Hz , sensitivity of 7 uV/mm, display speed of 15mm/sec with a 60Hz  notched filter applied as appropriate. EEG data were recorded continuously and digitally stored.  Video monitoring was available and reviewed as appropriate. Description: EEG showed continuous generalized 3 to 6 Hz theta-delta slowing.  Hyperventilation and photic stimulation were not performed.   ABNORMALITY - Continuous slow, generalized  IMPRESSION: This study is suggestive of moderate diffuse encephalopathy, nonspecific etiology. No seizures or epileptiform discharges were seen throughout the recording. Please note lack of epileptiform activity during interictal EEG does not exclude the diagnosis of epilepsy. Lora Havens    ASSESSMENT/PLAN:  Assessment: Principal Problem:   Altered mental status, unspecified Active Problems:   Seizure disorder (HCC)   AKI (acute kidney injury) ([place])   Acute encephalopathy   AMS (altered mental status)   KHIZAR SMITHER is a 62 y.o. 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 Currently on hospital day 3.  Plan: Acute Toxic metabolic encephalopathy  On exam today mentation is  improved and patient was able to tell us his name and location, per wife he is at baseline mentation. MRI yesterday showed no new infarcts. EEG shows moderate encephalopathy but no seizure activity. Will continue to hold baclofen and robaxin given his improvement since coming off of them. Dysphagia diet 3 recommended by speech. PT/OT recommend constant supervision at home. TOC to gather equipment needed prior to discharge.  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 No new infarcts noted on MRI. Will resume home medications -Resumed home ASA and statin  -Resumed home depakote, keppra, and vimpat    Constipation Patient had bowel movement overnight. Will continue senna and miralax for bowel regimen. -Senna 8.6mg  BID  -Miralax 17g daily  Hypokalemia Slightly low at 3.4. Will give one time dose of K 32mEq and recheck BMP tomorrow.  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 Trula Slade. Per patient's wife today they will go get the stitches taken out on April 3rd. Staples appear clean, dry, and intact.  Moderate Malnutrition -Ensure and Magic cup supplements  Best Practice: Diet: dysphagia 3 IVF: Fluids: none VTE: enoxaparin (LOVENOX) injection 40 mg Start: 09/09/22 1000 Code: Full Therapy Recs:  constant supervised assistance at home , DME: none Family Contact: wife, at bedside. DISPO: Anticipated discharge tomorrow to Home pending  improved mentation and assistive devices .   Signature: Marisa Cyphers, Medical Student  Attestation for Student Documentation:  I personally was present and performed or re-performed the history, physical exam and medical  decision-making activities of this service and have verified that the service and findings are accurately documented in the student's note.  Alesia Morin, MD 09/09/2022, 11:22 AM    Please contact the on call pager after 5 pm and on weekends at [phone].

## 2022-09-09 NOTE — Discharge Summary (Addendum)
Name: Gerald [name] MRN: [id number] DOB: 07-05-1960 62 y.o. PCP: Gerald Natal, MD  Date of Admission: 09/07/2022  1:25 PM Date of Discharge: 09/09/2022 Attending Physician: Dr. Angelia [name]  Discharge Diagnosis: Principal Problem:   Acute toxic metabolic encephalopathy  Active Problems:   Seizure disorder ([place])   AKI (acute kidney injury) ([place])   Malnutrition of moderate degree    Discharge Medications: Allergies as of 09/10/2022   No Known Allergies      Medication List     STOP taking these medications    Baclofen 5 MG Tabs       TAKE these medications    acetaminophen 500 MG tablet Commonly known as: TYLENOL Take 2 tablets (1,000 mg total) by mouth every 8 (eight) hours. What changed:  when to take this reasons to take this   alum & mag hydroxide-simeth 200-200-20 MG/5ML suspension Commonly known as: MAALOX/MYLANTA Take 15-30 mLs by mouth every 2 (two) hours as needed for indigestion.   amLODipine-olmesartan 10-40 MG tablet Commonly known as: AZOR Take 1 tablet by mouth daily.   aspirin EC 81 MG tablet Take 1 tablet (81 mg total) by mouth daily. Swallow whole.   atorvastatin 20 MG tablet Commonly known as: LIPITOR Take 1 tablet (20 mg total) by mouth daily at 6 PM.   divalproex 500 MG 24 hr tablet Commonly known as: DEPAKOTE ER Take 1,500 mg by mouth at bedtime.   feeding supplement Liqd Take 237 mLs by mouth 2 (two) times daily between meals.   fluticasone 50 MCG/ACT nasal spray Commonly known as: FLONASE Place 1 spray into both nostrils daily. What changed:  when to take this reasons to take this   guaiFENesin-dextromethorphan 100-10 MG/5ML syrup Commonly known as: ROBITUSSIN DM Take 15 mLs by mouth every 4 (four) hours as needed for cough.   lacosamide 50 MG Tabs tablet Commonly known as: VIMPAT Take 1 tablet (50 mg total) by mouth 2 (two) times daily.   levETIRAcetam 750 MG tablet Commonly known as: KEPPRA Take 2 tablets  (1,500 mg total) by mouth 2 (two) times daily.   metoprolol tartrate 25 MG tablet Commonly known as: LOPRESSOR Take 1 tablet (25 mg total) by mouth 2 (two) times daily.   ondansetron 4 MG/2ML Soln injection Commonly known as: ZOFRAN Inject 2 mLs (4 mg total) into the vein every 6 (six) hours as needed for nausea or vomiting.   pantoprazole 40 MG tablet Commonly known as: PROTONIX Take 1 tablet (40 mg total) by mouth daily.   polyethylene glycol 17 g packet Commonly known as: MIRALAX / GLYCOLAX Take 17 g by mouth daily. What changed:  when to take this reasons to take this               Durable Medical Equipment  (From admission, onward)           Start     Ordered   09/09/22 1025  For home use only DME Bedside commode  Once       Comments: Needs to be a drop arm  Question:  Patient needs a bedside commode to treat with the following condition  Answer:  Weakness   09/09/22 1025   09/09/22 0838  For home use only DME Other see comment  Once       Comments: Gerald [name] lift  Question:  Length of Need  Answer:  6 Months   09/09/22 0837   09/09/22 0836  For home use only DME Hospital bed  Once       Question Answer Comment  Length of Need 6 Months   Patient has (list medical condition): prior CVA, right AKA   The above medical condition requires: Patient requires the ability to reposition frequently   Bed type Semi-electric      09/09/22 0837   09/08/22 1641  For home use only DME Hospital bed  Once       Question Answer Comment  Length of Need 6 Months   The above medical condition requires: Patient requires the ability to reposition frequently   Head must be elevated greater than: 30 degrees   Bed type Semi-electric   Hoyer Lift Yes      09/08/22 1640            Disposition and follow-up:   Gerald [name] was discharged from [facility] in Stable condition.  At the hospital follow up visit please address:  1.  Follow-up:  a. Acute  encephalopathy: if patient has returned to baseline     b. PO intake/hydration status: most likely contributed to his confusion   c. S/p Right AKA: staples to be removed by surgery April 3rd per patient's wife    d. Advance care planning: patient's sister of POA, but wife is primary caretaker. Please consider filling out MOST form with both wife and sister present at follow-up appointment.  2.  Labs / imaging needed at time of follow-up: BMP  3.  Pending labs/ test needing follow-up: none  4.  Medication Changes  Stopped: Baclofen and Robaxin    Follow-up Appointments: April 3rd 1:15pm with vascular surgery April 9th 3:45pm with IMTS clinic  May 15th 3:30am with Neurology    Hospital Course by problem list:   Toxic metabolic encephalopathy  His spouse reports he has difficulty maintaining adequate hydration. This along with urination changes may be contributing to his symptoms. Patient is currently on multiple AEDs, baclofen, and robaxin which increased his risk for confusion. Held baclofen and robaxin during hospital stay. Also considered another CVA given inability to fully neurological assess patient due to his mental status. MRI showed no evidence of acute/subacute infarct. He is high risk of aspiration as he recently has history on choking on his food, placed on dysphagia 3 diet. Patient was given LR bolus and continuous fluid initially. Mentation improved in the following days and returned to baseline. Patient was discharged home with supplies including drop arm, hoyer lift, and semi-electric.  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 Depokate level normal. EEG showed no seizure activity. Resumed home ASA, statin, depakote, keppra, and vimpat.   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 Trula Slade. Per vascular surgery note from 3/20, staples are to be  removed around 3/27. Staples appear clean, dry, and intact. Appointment scheduled for 4/3 for staple removal.   Constipation Moderate stool burden noted on abdominal xray at admission. Started senna and miralax for bowel regimen with good effect.   Discharge Subjective: Patient was excited at the thought of going home. He states that he feels well enough to go home.   Discharge Exam:   BP 128/71 (BP Location: Left Arm)   Pulse 83   Temp 98.2 F (36.8 C)   Resp 18   Ht 6' (1.829 m)   Wt 88.4 kg   SpO2 100%   BMI 26.43 kg/m  Constitutional: well-appearing male  sitting in bed, in no acute distress HENT: normocephalic atraumatic, mucous membranes moist Eyes: conjunctiva non-erythematous Neck: supple Cardiovascular: regular rate and rhythm, no m/r/g Pulmonary/Chest: normal work of breathing on room air, lungs clear to auscultation bilaterally Abdominal: soft, non-tender, non-distended MSK: normal bulk and tone Neurological: alert & oriented x 2 Skin: warm and dry   Pertinent Labs, Studies, and Procedures:     Latest Ref Rng & Units 09/09/2022    6:40 AM 09/08/2022    3:52 AM 09/07/2022   10:48 AM  CBC  WBC 4.0 - 10.5 K/uL 6.3  7.2  6.4   Hemoglobin 13.0 - 17.0 g/dL 11.9  12.3  15.9   Hematocrit 39.0 - 52.0 % 37.1  37.1  49.1   Platelets 150 - 400 K/uL 281  445  381        Latest Ref Rng & Units 09/10/2022    7:17 AM 09/09/2022    7:26 AM 09/08/2022    3:52 AM  CMP  Glucose 70 - 99 mg/dL 106  108  116   BUN 6 - 20 mg/dL 5  5  10    Creatinine 0.61 - 1.24 mg/dL 0.62  0.60  0.80   Sodium 135 - 145 mmol/L 143  142  140   Potassium 3.5 - 5.1 mmol/L 3.4  3.4  3.5   Chloride 98 - 111 mmol/L 105  108  107   CO2 22 - 32 mmol/L 30  26  26    Calcium 8.9 - 10.3 mg/dL 9.1  8.6  8.8     MR BRAIN WO CONTRAST  Result Date: 09/08/2022 CLINICAL DATA:  Stroke suspected, altered mental status EXAM: MRI HEAD WITHOUT CONTRAST TECHNIQUE: Multiplanar, multiecho pulse sequences of the brain  and surrounding structures were obtained without intravenous contrast. COMPARISON:  05/27/2022 MRI head, correlation is also made with CT head 09/07/2022 FINDINGS: Brain: No restricted diffusion to suggest acute or subacute infarct. No acute hemorrhage, mass, mass effect, or midline shift. No hydrocephalus or extra-axial collection. Normal pituitary and craniocervical junction. Redemonstrated chronic left ACA territory infarct, with encephalomalacia and ex vacuo dilatation left frontal horn. Confluent T2 hyperintense signal in the periventricular white matter, likely the sequela of severe chronic small vessel ischemic disease. Vascular: Normal arterial flow voids. Skull and upper cervical spine: Normal marrow signal. Sinuses/Orbits: Overall clear paranasal sinuses. No acute finding in the orbits. Other: Trace fluid in the right mastoid air cells. IMPRESSION: No acute intracranial process. No evidence of acute or subacute infarct. Electronically Signed   By: Merilyn Baba M.D.   On: 09/08/2022 19:44   EEG adult  Result Date: 09/08/2022 Lora Havens, MD     09/08/2022  1:22 PM Patient Name: COLWYN BAGGARLY MRN: [id number] Epilepsy Attending: Lora Havens Referring Physician/Provider: Rick Duff, MD Date: 09/08/2022 Duration: 21.36 mins Patient history: 62yo m with ams. EEG to evaluate for seizure. Level of alertness: Awake AEDs during EEG study: LEV, LCM, VPA Technical aspects: This EEG study was done with scalp electrodes positioned according to the 10-20 International system of electrode placement. Electrical activity was reviewed with band pass filter of 1-70Hz , sensitivity of 7 uV/mm, display speed of 60mm/sec with a 60Hz  notched filter applied as appropriate. EEG data were recorded continuously and digitally stored.  Video monitoring was available and reviewed as appropriate. Description: EEG showed continuous generalized 3 to 6 Hz theta-delta slowing.  Hyperventilation and photic stimulation  were not performed.   ABNORMALITY - Continuous slow, generalized IMPRESSION: This  study is suggestive of moderate diffuse encephalopathy, nonspecific etiology. No seizures or epileptiform discharges were seen throughout the recording. Please note lack of epileptiform activity during interictal EEG does not exclude the diagnosis of epilepsy. Foley   CT HEAD WO CONTRAST (5MM)  Result Date: 09/07/2022 CLINICAL DATA:  Altered mental status EXAM: CT HEAD WITHOUT CONTRAST TECHNIQUE: Contiguous axial images were obtained from the base of the skull through the vertex without intravenous contrast. RADIATION DOSE REDUCTION: This exam was performed according to the departmental dose-optimization program which includes automated exposure control, adjustment of the mA and/or kV according to patient size and/or use of iterative reconstruction technique. COMPARISON:  CT Head 07/27/22 FINDINGS: Brain: Redemonstrated is a chronic left ACA territory infarct. There is ex vacuo dilatation of the left frontal horn. No hydrocephalus. No hemorrhage. No extra-axial fluid collection. There is sequela of moderate overall chronic microvascular ischemic change. There is a chronic infarct in the corona radiata on the right (series 4, image 16). There is chronic infarct involving the left lentiform nucleus. CT evidence of an acute cortical infarct. Vascular: No hyperdense vessel or unexpected calcification. Skull: Normal. Negative for fracture or focal lesion. Sinuses/Orbits: No middle ear or mastoid effusion. Paranasal sinuses are clear. Orbits are unremarkable. Other: None. IMPRESSION: 1. No acute intracranial abnormality. 2. Chronic left ACA territory infarct. 3. Moderate overall chronic microvascular ischemic change. Electronically Signed   By: Marin Roberts M.D.   On: 09/07/2022 15:03   Abd 1 View (KUB)  Result Date: 09/07/2022 CLINICAL DATA:  Constipation EXAM: ABDOMEN - 1 VIEW COMPARISON:  Radiograph 08/04/2022 FINDINGS:  No evidence of bowel obstruction. Moderate colonic stool burden. No radiopaque calculi overlie the kidneys. No acute osseous abnormality. IMPRESSION: No evidence of bowel obstruction.  Moderate colonic stool burden. Electronically Signed   By: Maurine Simmering M.D.   On: 09/07/2022 14:54     Discharge Instructions: Discharge Instructions     Diet - low sodium heart healthy   Complete by: As directed    Increase activity slowly   Complete by: As directed       Discharge Instructions      Dear Mr. Parchem, it has been a pleasure caring for you and I am so happy to see you doing well! You were hospitalized for confusion and treated for dehydration.  We also continued a lot of your home medications to manage your seizures. In addition to medications, you were also seen by PT and OT.   When you are discharged we would like you to do the following:  Continue taking your medications as you were before you came to the hospital.  Please stop taking your Baclofen and Robaxin.  3.   Please go to your scheduled appointments:  Vascular surgery appointment for staple removal on 09/15/2022 at 1:15 pm.  Internal medicine clinic for hospital follow-up on 09/21/2022 at 3:45pm with Dr. Dema Severin  Neurology appointment with Debbora Presto on 10/27/2022 at 8:30 am.        Alesia Morin, MD 09/10/2022, 12:40 PM

## 2022-09-09 NOTE — Care Management Important Message (Signed)
Important Message  Patient Details  Name: Gerald [name] MRN: [id number] Date of Birth: [birth year]   Medicare Important Message Given:  Yes     Orbie Pyo 09/09/2022, 3:19 PM

## 2022-09-10 DIAGNOSIS — E440 Moderate protein-calorie malnutrition: Secondary | ICD-10-CM | POA: Diagnosis not present

## 2022-09-10 DIAGNOSIS — G928 Other toxic encephalopathy: Secondary | ICD-10-CM | POA: Diagnosis not present

## 2022-09-10 DIAGNOSIS — G40909 Epilepsy, unspecified, not intractable, without status epilepticus: Secondary | ICD-10-CM | POA: Diagnosis not present

## 2022-09-10 LAB — BASIC METABOLIC PANEL
Anion gap: 8 (ref 5–15)
BUN: 5 mg/dL — ABNORMAL LOW (ref 6–20)
CO2: 30 mmol/L (ref 22–32)
Calcium: 9.1 mg/dL (ref 8.9–10.3)
Chloride: 105 mmol/L (ref 98–111)
Creatinine, Ser: 0.62 mg/dL (ref 0.61–1.24)
GFR, Estimated: >60 mL/min (ref >60)
Glucose, Bld: 106 mg/dL — ABNORMAL HIGH (ref 70–99)
Potassium: 3.4 mmol/L — ABNORMAL LOW (ref 3.5–5.1)
Sodium: 143 mmol/L (ref 135–145)

## 2022-09-10 MED ORDER — POTASSIUM CHLORIDE CRYS ER 20 MEQ PO TBCR
40.0000 meq | EXTENDED_RELEASE_TABLET | Freq: Once | ORAL | Status: AC
  Administered 2022-09-10: 40 meq via ORAL
  Filled 2022-09-10: qty 2

## 2022-09-10 NOTE — Plan of Care (Signed)
  Problem: Clinical Measurements: Goal: Ability to maintain clinical measurements within normal limits will improve Outcome: Progressing   

## 2022-09-10 NOTE — TOC Progression Note (Signed)
Transition of Care [facility]) - Progression Note    Patient Details  Name: Gerald [name] MRN: [id number] Date of Birth: 1961/04/12  Transition of Care [facility]) CM/SW [place], [place] Phone Number: 09/10/2022, 10:57 AM  Clinical Narrative:   CSW contacted back by [employer] that patient had already received a bed and lift from [employer] in 2023, so they are unable to receive a new one. CSW spoke with sister, Gerald [name], and they had received the DME but returned it after a month. [employer] contacted sister and found where the DME was returned, they are reviewing to see if the patient's Medicare can be billed for the DME again since it was returned. CSW awaiting call back on DME coverage and delivery.    Expected Discharge Plan: [place] Barriers to Discharge: Continued Medical Work up, Ship broker  Expected Discharge Plan and Services     Post Acute Care Choice: Home Health, Durable Medical Equipment Living arrangements for the past 2 months: Apartment                 DME Arranged: Hospital bed, Other see comment (hoyer lift) DME Agency: [employer]     Representative spoke with at DME Agency: [place]: RN, PT, OT, Nurse's Aide HH Agency: [place]     Representative spoke with at [place]: [place] ([place]) Interventions [place]: Food Insecurity Present (09/07/2022)  Housing: Low Risk  (09/07/2022)  Transportation Needs: Unmet Transportation Needs (09/07/2022)  Utilities: Not At Risk (09/07/2022)  Depression (PHQ2-9): Medium Risk (09/08/2022)  Social Connections: Moderately Integrated (06/15/2022)  Tobacco Use: High Risk (09/07/2022)    Readmission Risk Interventions     No data to display

## 2022-09-10 NOTE — TOC Transition Note (Signed)
Transition of Care [facility]) - CM/SW Discharge Note   Patient Details  Name: Gerald [name] MRN: [id number] Date of Birth: Jun 30, 1960  Transition of Care [facility]) CM/SW Contact:  Gerald Ochs, LCSW Phone Number: 09/10/2022, 1:11 PM   Clinical Narrative:   CSW confirmed with [employer] and sister, Gerald [name], that DME was delivered and family is ready for patient to transition back home. CSW updated [facility] that patient was going home. Transport arranged with [facility] for next available.    Final next level of care: Home w Home Health Services Barriers to Discharge: Barriers Resolved   Patient Goals and CMS Choice CMS Medicare.gov Compare Post Acute Care list provided to:: Patient Represenative (must comment) Choice offered to / list presented to : Spouse, Sibling  Discharge Placement                  Patient to be transferred to facility by: [facility] Name of family member notified: Gerald [name] Patient and family notified of of transfer: 09/10/22  Discharge Plan and Services Additional resources added to the After Visit Summary for       Post Acute Care Choice: Home Health, Durable Medical Equipment          DME Arranged: Hospital bed, Other see comment (hoyer lift) DME Agency: [employer]     Representative spoke with at DME Agency: [place]: RN, PT, OT, Nurse's Aide [place] Agency: [place]     Representative spoke with at [place]: [place] ([place]) Interventions SDOH Screenings   Food Insecurity: Food Insecurity Present (09/07/2022)  Housing: Low Risk  (09/07/2022)  Transportation Needs: Unmet Transportation Needs (09/07/2022)  Utilities: Not At Risk (09/07/2022)  Depression (PHQ2-9): Medium Risk (09/08/2022)  Social Connections: Moderately Integrated (06/15/2022)  Tobacco Use: High Risk (09/07/2022)     Readmission Risk Interventions     No data to display

## 2022-09-10 NOTE — TOC Initial Note (Signed)
Transition of Care [facility]) - Initial/Assessment Note    Patient Details  Name: Gerald [name] MRN: [id number] Date of Birth: 08-05-1960  Transition of Care [name]) CM/SW Contact:    Gerald Ochs, Gerald [name] Phone Number: 09/10/2022, 10:47 AM  Clinical Narrative:              CSW met with patient and spouse at bedside to discuss recommendation for SNF. Patient recently at [name] and they loved it, hopeful to return, but patient is in copay days. Spouse indicated that the patient's sister, Gerald [name], is his POA and will need to be contacted. CSW contacted Gerald [name] and discussed, patient will be unable to afford the copays and will need to return home. Patient has a 3N1, wheelchair, and stedy lift at home. CSW discussed with therapy, patient will need a hospital bed and hoyer lift. CSW asked Gerald [name] about the patient's DME provider, she will need to look who provided the wheelchair and call CSW back.    UPDATE: CSW contacted [place] to ask about who ordered patient's DME. DME was ordered through [employer]. CSW contacted [employer] to place order for bed and lift. CSW to follow.   Expected Discharge Plan: [place] Barriers to Discharge: Continued Medical Work up, Ship broker   Patient Goals and CMS Choice Patient states their goals for this hospitalization and ongoing recovery are:: patient unable to participate in goal setting, not oriented CMS Medicare.gov Compare Post Acute Care list provided to:: Patient Represenative (must comment) Choice offered to / list presented to : Spouse, Sibling Gerald [name] ownership interest in [name].provided to:: Sibling    Expected Discharge Plan and Services     Post Acute Care Choice: Home Health, Durable Medical Equipment Living arrangements for the past 2 months: Apartment                 DME Arranged: Hospital bed, Other see comment (hoyer lift) DME Agency: [employer]     Representative spoke with at DME  Agency: Gerald [name] Arranged: RN, PT, OT, Nurse's Aide [employer] Agency: Gerald [name]     Representative spoke with at [place]: Gerald [name]  Prior Living Arrangements/Services Living arrangements for the past 2 months: Apartment Lives with:: Spouse Patient language and need for interpreter reviewed:: No Do you feel safe going back to the place where you live?: Yes      Need for Family Participation in Patient Care: Yes (Comment) Care giver support system in place?: Yes (comment)   Criminal Activity/Legal Involvement Pertinent to Current Situation/Hospitalization: No - Comment as needed  Activities of Daily Living Home Assistive Devices/Equipment: Transfer board, Wheelchair, Environmental consultant (specify type), Bedside commode/3-in-1, [employer] fast streamer ADL Screening (condition at time of admission) Patient's cognitive ability adequate to safely complete daily activities?: No (info per wife) Is the patient deaf or have difficulty hearing?: Yes Does the patient have difficulty seeing, even when wearing glasses/contacts?: No Does the patient have difficulty concentrating, remembering, or making decisions?: Yes Patient able to express need for assistance with ADLs?: Yes Does the patient have difficulty dressing or bathing?: Yes Independently performs ADLs?: No Communication: Independent Dressing (OT): Dependent Is this a change from baseline?: Change from baseline, expected to last <3days Grooming: Dependent Is this a change from baseline?: Change from baseline, expected to last <3 days Feeding: Needs assistance Is this a change from baseline?: Change from baseline, expected to last <3 days Bathing: Needs assistance Is this a change from baseline?: Change from baseline, expected  to last <3 days Toileting: Dependent Is this a change from baseline?: Change from baseline, expected to last <3 days In/Out Bed: Dependent Is this a change from baseline?: Change from baseline, expected to last <3 days Walks in  Home: Dependent Is this a change from baseline?: Change from baseline, expected to last <3 days Does the patient have difficulty walking or climbing stairs?: Yes Weakness of Legs: Right (right AKA) Weakness of Arms/Hands: Right  Permission Sought/Granted Permission sought to share information with : Family Supports Permission granted to share information with : Yes, Verbal Permission Granted  Share Information with NAME: Gerald [name]     Permission granted to share info w Relationship: Spouse, Sister     Emotional Assessment   Attitude/Demeanor/Rapport: Unable to Assess Affect (typically observed): Unable to Assess Orientation: : Oriented to Self Alcohol / Substance Use: Not Applicable Psych Involvement: No (comment)  Admission diagnosis:  AMS (altered mental status) [R41.82] Altered mental status, unspecified [id number] Acute metabolic encephalopathy [id number] Patient Active Problem List   Diagnosis Date Noted   Acute metabolic encephalopathy 0000000   Malnutrition of moderate degree 09/09/2022   Altered mental status, unspecified 09/08/2022   Acute encephalopathy 09/07/2022   AMS (altered mental status) 09/07/2022   Above knee amputation of right lower extremity ([place]) 08/04/2022   Gross hematuria 08/04/2022   Normocytic anemia 07/29/2022   Critical limb ischemia of right lower extremity with gangrene ([place]) 07/27/2022   AKI (acute kidney injury) ([place]) 07/27/2022   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   PAD (peripheral artery disease) ([place]) 07/21/2022   ASCVD (arteriosclerotic cardiovascular disease) 06/15/2022   History of stroke 06/09/2017   Seizure disorder ([place]) 05/22/2016   Anxiety state 04/20/2016   Spastic hemiplegia affecting dominant side ([place]) 09/11/2015   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    Essential hypertension 01/15/2013   Nicotine dependence 01/15/2013   Alcohol use disorder 01/15/2013   PCP:  Linward Natal, MD Pharmacy:   [name] [name] - [address] [name] [address] Phone: [phone] Fax: [phone]     Social Determinants of Health (SDOH) Social History: SDOH Screenings   Food Insecurity: Food Insecurity Present (09/07/2022)  Housing: Low Risk  (09/07/2022)  Transportation Needs: Unmet Transportation Needs (09/07/2022)  Utilities: Not At Risk (09/07/2022)  Depression (PHQ2-9): Medium Risk (09/08/2022)  Social Connections: Moderately Integrated (06/15/2022)  Tobacco Use: High Risk (09/07/2022)   SDOH Interventions:     Readmission Risk Interventions     No data to display

## 2022-09-10 NOTE — Progress Notes (Signed)
Mobility Specialist Progress Note   09/10/22 1124  Mobility  Activity Transferred from bed to chair (Propelled in w/c)  Level of Assistance Moderate assist, patient does 50-74%  Assistive Device Wheelchair  Distance Ambulated (ft)  (Propelled 321ft)  RLE Weight Bearing NWB  Activity Response Tolerated well  Mobility Referral Yes  $Mobility charge 1 Mobility   Received pt in bed w/ no complaints and agreeable. Pt able to get EOB w/ support of pad and squat, pivot to w/c after x2 trials w/ modA. Pt limited by fear, general weakness and R hemiplegia. Pt able to propel self in hallway w/ LUE and pedal w/ LLE. Pt able follow directional cues well but required minA for assistance in steering. Returned back to room and laterally scooted back to bed w/ modA. Left w/ call bell in reach and bed alarm on.   [place] Falling Mobility Specialist Please contact via SecureChat or  Rehab office at [phone]

## 2022-09-10 NOTE — Progress Notes (Signed)
HD#3 SUBJECTIVE:  Patient Summary: Gerald [name] is a 62 y.o. 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  Overnight Events: Patient seen this morning. He reports sleeping and eating well. He denies issues with urination or bowel movement.    OBJECTIVE:  Vital Signs: Vitals:   09/09/22 0738 09/09/22 1941 09/09/22 2312 09/10/22 0434  BP: 137/78 120/70 127/77 (!) 141/79  Pulse: 83 93 91 91  Resp:  18 18 20   Temp: 98.2 F (36.8 C) 98.2 F (36.8 C) 98.7 F (37.1 C) 98.2 F (36.8 C)  TempSrc: Oral Oral Oral Oral  SpO2: 96% 96% 96% 94%  Weight:      Height:       Supplemental O2: Room Air SpO2: 94 %  Filed Weights   09/07/22 1300  Weight: 88.4 kg     Intake/Output Summary (Last 24 hours) at 09/10/2022 0700 Last data filed at 09/09/2022 2200 Gross per 24 hour  Intake 340 ml  Output 500 ml  Net -160 ml   Net IO Since Admission: 1,920 mL [09/10/22 0700]  Physical Exam: General: Pleasant, well-appearing male in bed. No acute distress. CV: RRR.  No LE edema Pulmonary: Normal effort.  MSK: Right AKA, staple intact with well-healing incision Neuro: A&Ox3. Moves all extremities. 4/5 strength in left upper extremity, 3/5 in right upper extremity. Strength 4/5 in left lower extremity, unchanged from yesterday    Patient Lines/Drains/Airways Status     Active Line/Drains/Airways     Name Placement date Placement time Site Days   Peripheral IV 09/07/22 22 G 1.75" Anterior;Left Forearm 09/07/22  1624  Forearm  1   External Urinary Catheter 09/07/22  1500  --  1   Incision (Closed) 09/07/22 Thigh Anterior;Right 09/07/22  1807  -- 1   Incision (Closed) 09/07/22 Thigh Anterior;Distal;Right 09/07/22  1813  -- 1   Wound / Incision (Open or Dehisced) 07/24/15 Other (Comment)  Buttocks Left 3x3x2 07/24/15  1130  Buttocks  2603   Wound / Incision (Open or Dehisced) 07/24/15 Other (Comment) Thigh Right 1x1x1 07/24/15  1130  Thigh  2603   Wound / Incision (Open or Dehisced) 07/28/22 Skin tear;Non-pressure wound;Venous stasis ulcer Foot Anterior;Right;Posterior;Circumferential foot is discolored, red, purple, bursted blister present 07/28/22  0230  Foot  42            Pertinent Labs:    Latest Ref Rng & Units 09/09/2022    6:40 AM 09/08/2022    3:52 AM 09/07/2022   10:48 AM  CBC  WBC 4.0 - 10.5 K/uL 6.3  7.2  6.4   Hemoglobin 13.0 - 17.0 g/dL 11.9  12.3  15.9   Hematocrit 39.0 - 52.0 % 37.1  37.1  49.1   Platelets 150 - 400 K/uL 281  445  381        Latest Ref Rng & Units 09/09/2022    7:26 AM 09/08/2022    3:52 AM 09/07/2022   10:48 AM  CMP  Glucose 70 - 99 mg/dL 108  116  117   BUN 6 - 20 mg/dL 5  10  14    Creatinine 0.61 - 1.24 mg/dL 0.60  0.80  1.32   Sodium 135 - 145 mmol/L 142  140  142   Potassium 3.5 - 5.1 mmol/L 3.4  3.5  3.4   Chloride 98 - 111 mmol/L 108  107  103   CO2 22 - 32 mmol/L 26  26  27    Calcium 8.9 - 10.3 mg/dL 8.6  8.8  9.6   Total Protein 6.5 - 8.1 g/dL   7.6   Total Bilirubin 0.3 - 1.2 mg/dL   0.6   Alkaline Phos 38 - 126 U/L   50   AST 15 - 41 U/L   12   ALT 0 - 44 U/L   8    Drugs of Abuse     Component Value Date/Time   LABOPIA NONE DETECTED 09/07/2022 2027   COCAINSCRNUR NONE DETECTED 09/07/2022 2027   COCAINSCRNUR Negative 07/27/2021 1052   LABBENZ NONE DETECTED 09/07/2022 2027   AMPHETMU NONE DETECTED 09/07/2022 2027   THCU NONE DETECTED 09/07/2022 2027   LABBARB NONE DETECTED 09/07/2022 2027     Urinalysis    Component Value Date/Time   COLORURINE AMBER (A) 09/07/2022 2027   APPEARANCEUR HAZY (A) 09/07/2022 2027   LABSPEC 1.028 09/07/2022 2027   PHURINE 5.0 09/07/2022 2027   GLUCOSEU NEGATIVE 09/07/2022 2027   HGBUR NEGATIVE 09/07/2022 2027   BILIRUBINUR SMALL (A) 09/07/2022 2027   KETONESUR 20 (A)  09/07/2022 2027   PROTEINUR 30 (A) 09/07/2022 2027   UROBILINOGEN 0.2 01/15/2013 1538   NITRITE NEGATIVE 09/07/2022 2027   LEUKOCYTESUR NEGATIVE 09/07/2022 2027      Pertinent Imaging: MR BRAIN WO CONTRAST IMPRESSION: No acute intracranial process. No evidence of acute or subacute infarct.    EEG adult IMPRESSION: This study is suggestive of moderate diffuse encephalopathy, nonspecific etiology. No seizures or epileptiform discharges were seen throughout the recording.  ASSESSMENT/PLAN:  Assessment: Principal Problem:   Altered mental status, unspecified Active Problems:   Seizure disorder (HCC)   AKI (acute kidney injury) ([place])   Acute encephalopathy   AMS (altered mental status)   Acute metabolic encephalopathy   Malnutrition of moderate degree   [name] is a 62 y.o. 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 Currently on hospital day 3.  Plan: Acute Toxic metabolic encephalopathy  Patient is back at baseline mentation. Will continue to hold baclofen and robaxin given his improvement since coming off of them. PT/OT recommend constant supervision at home. TOC to gather equipment needed prior to discharge.  -Strict I/Os -Folic acid and thiamine daily -Holding baclofen and robaxin    HTN Restarting medication as AKI resolved -Continue amlodipine 10 mg daily -CTM   Prior left ACA CVA with residual right-sided spastic hemiplegia Seizure disorder -Resumed home ASA and statin  -Resumed home depakote, keppra, and vimpat    Constipation Patient had bowel movement overnight. Will continue senna and miralax for bowel regimen. -Senna 8.6mg  BID  -Miralax 17g daily  Hypokalemia Slightly low at 3.4. Repleting with 40 meq of KCl  Best Practice: Diet: dysphagia  3 IVF: Fluids: none VTE: enoxaparin (LOVENOX) injection 40 mg Start: 09/09/22 1000 Code: Full Therapy Recs:  constant supervised assistance at home , DME: none Family Contact: wife, at bedside. DISPO: Anticipated discharge tomorrow to Home pending  improved mentation and assistive devices .  Coralyn Pear, MD PGY-1 Psychiatry  Please contact the on call pager after 5 pm and on weekends at [phone].

## 2022-09-11 LAB — LACOSAMIDE: Lacosamide: 13.4 ug/mL — ABNORMAL HIGH (ref 5.0–10.0)

## 2022-09-13 ENCOUNTER — Telehealth: Payer: Self-pay

## 2022-09-13 NOTE — Transitions of Care (Post Inpatient/ED Visit) (Signed)
   09/13/2022  Name: AAHAN BARRESE MRN: [id number] DOB: [birth year]  Today's TOC FU Call Status: Today's TOC FU Call Status:: Unsuccessul Call (1st Attempt) Unsuccessful Call (1st Attempt) Date: 09/13/22  Attempted to reach the patient regarding the most recent Inpatient/ED visit.  Follow Up Plan: Additional outreach attempts will be made to reach the patient to complete the Transitions of Care (Post Inpatient/ED visit) call.   Johnney Killian, RN, BSN, CCM Care Management Coordinator [place]/[facility] Phone: [phone]: [phone]

## 2022-09-14 ENCOUNTER — Telehealth: Payer: Self-pay

## 2022-09-14 DIAGNOSIS — Z4781 Encounter for orthopedic aftercare following surgical amputation: Secondary | ICD-10-CM | POA: Diagnosis not present

## 2022-09-14 DIAGNOSIS — F17210 Nicotine dependence, cigarettes, uncomplicated: Secondary | ICD-10-CM | POA: Diagnosis not present

## 2022-09-14 DIAGNOSIS — F1410 Cocaine abuse, uncomplicated: Secondary | ICD-10-CM | POA: Diagnosis not present

## 2022-09-14 DIAGNOSIS — Z7982 Long term (current) use of aspirin: Secondary | ICD-10-CM | POA: Diagnosis not present

## 2022-09-14 DIAGNOSIS — I70221 Atherosclerosis of native arteries of extremities with rest pain, right leg: Secondary | ICD-10-CM | POA: Diagnosis not present

## 2022-09-14 DIAGNOSIS — I69351 Hemiplegia and hemiparesis following cerebral infarction affecting right dominant side: Secondary | ICD-10-CM | POA: Diagnosis not present

## 2022-09-14 DIAGNOSIS — S78111D Complete traumatic amputation at level between right hip and knee, subsequent encounter: Secondary | ICD-10-CM | POA: Diagnosis not present

## 2022-09-14 DIAGNOSIS — I10 Essential (primary) hypertension: Secondary | ICD-10-CM | POA: Diagnosis not present

## 2022-09-14 DIAGNOSIS — F1010 Alcohol abuse, uncomplicated: Secondary | ICD-10-CM | POA: Diagnosis not present

## 2022-09-14 DIAGNOSIS — Z79899 Other long term (current) drug therapy: Secondary | ICD-10-CM | POA: Diagnosis not present

## 2022-09-14 DIAGNOSIS — D649 Anemia, unspecified: Secondary | ICD-10-CM | POA: Diagnosis not present

## 2022-09-14 DIAGNOSIS — Z9181 History of falling: Secondary | ICD-10-CM | POA: Diagnosis not present

## 2022-09-14 DIAGNOSIS — N281 Cyst of kidney, acquired: Secondary | ICD-10-CM | POA: Diagnosis not present

## 2022-09-14 DIAGNOSIS — Z89611 Acquired absence of right leg above knee: Secondary | ICD-10-CM | POA: Diagnosis not present

## 2022-09-14 DIAGNOSIS — G4089 Other seizures: Secondary | ICD-10-CM | POA: Diagnosis not present

## 2022-09-14 NOTE — Transitions of Care (Post Inpatient/ED Visit) (Signed)
   09/14/2022  Name: Gerald [name] MRN: [id number] DOB: 06-21-1960  Today's TOC FU Call Status: Today's TOC FU Call Status:: Unsuccessful Call (2nd Attempt) Unsuccessful Call (2nd Attempt) Date: 09/14/22  Attempted to reach the patient regarding the most recent Inpatient/ED visit.  Follow Up Plan: Additional outreach attempts will be made to reach the patient to complete the Transitions of Care (Post Inpatient/ED visit) call.   Johnney Killian, RN, BSN, CCM Care Management Coordinator [place]/[facility] Phone: [phone]: [phone]

## 2022-09-15 ENCOUNTER — Ambulatory Visit (INDEPENDENT_AMBULATORY_CARE_PROVIDER_SITE_OTHER): Payer: PPO | Admitting: Physician Assistant

## 2022-09-15 ENCOUNTER — Telehealth: Payer: Self-pay

## 2022-09-15 VITALS — BP 125/77 | HR 72 | Temp 97.5°F | Resp 20 | Ht 72.0 in

## 2022-09-15 DIAGNOSIS — Z89611 Acquired absence of right leg above knee: Secondary | ICD-10-CM

## 2022-09-15 NOTE — Progress Notes (Signed)
Internal Medicine Clinic Attending  Case discussed with Dr. White  at the time of the visit.  We reviewed the resident's history and exam and pertinent patient test results.  I agree with the assessment, diagnosis, and plan of care documented in the resident's note.  

## 2022-09-15 NOTE — Addendum Note (Signed)
Addended by: Gilles Chiquito B on: 09/15/2022 03:21 PM   Modules accepted: Level of Service

## 2022-09-15 NOTE — Progress Notes (Signed)
POST OPERATIVE OFFICE NOTE    CC:  F/u for surgery  HPI:  This is a 62 y.o. male who is s/p right AKA for ischemic right LE and  non functional RLE from prior CVA  without re vasculaization options on 07/30/22 by Dr. Trula Slade.  He is non ambulatory and his mode of transportation is WC.  He is accompanied by family today.    Pt returns today for follow up.  Pt states he has no complaints and his pain is well controlled.  He denise ischemic symptoms/changes on the left LE.  He lives at home with family.  He is medically managed on ASA and Statin daily.     No Known Allergies  Current Outpatient Medications  Medication Sig Dispense Refill   acetaminophen (TYLENOL) 500 MG tablet Take 2 tablets (1,000 mg total) by mouth every 8 (eight) hours. (Patient taking differently: Take 1,000 mg by mouth every 8 (eight) hours as needed for mild pain or moderate pain.) 30 tablet 0   alum & mag hydroxide-simeth (MAALOX/MYLANTA) 200-200-20 MG/5ML suspension Take 15-30 mLs by mouth every 2 (two) hours as needed for indigestion. 355 mL 0   amLODipine-olmesartan (AZOR) 10-40 MG tablet Take 1 tablet by mouth daily. 90 tablet 2   aspirin EC 81 MG tablet Take 1 tablet (81 mg total) by mouth daily. Swallow whole. 30 tablet 12   atorvastatin (LIPITOR) 20 MG tablet Take 1 tablet (20 mg total) by mouth daily at 6 PM. 30 tablet 3   divalproex (DEPAKOTE ER) 500 MG 24 hr tablet Take 1,500 mg by mouth at bedtime.     feeding supplement (ENSURE ENLIVE / ENSURE PLUS) LIQD Take 237 mLs by mouth 2 (two) times daily between meals. 237 mL 12   fluticasone (FLONASE) 50 MCG/ACT nasal spray Place 1 spray into both nostrils daily. (Patient taking differently: Place 1 spray into both nostrils daily as needed for allergies.) 11.1 mL 2   guaiFENesin-dextromethorphan (ROBITUSSIN DM) 100-10 MG/5ML syrup Take 15 mLs by mouth every 4 (four) hours as needed for cough. 118 mL 0   lacosamide (VIMPAT) 50 MG TABS tablet Take 1 tablet (50 mg  total) by mouth 2 (two) times daily. 60 tablet 0   levETIRAcetam (KEPPRA) 750 MG tablet Take 2 tablets (1,500 mg total) by mouth 2 (two) times daily. 360 tablet 3   metoprolol tartrate (LOPRESSOR) 25 MG tablet Take 1 tablet (25 mg total) by mouth 2 (two) times daily. 60 tablet 0   ondansetron (ZOFRAN) 4 MG/2ML SOLN injection Inject 2 mLs (4 mg total) into the vein every 6 (six) hours as needed for nausea or vomiting. 2 mL 0   pantoprazole (PROTONIX) 40 MG tablet Take 1 tablet (40 mg total) by mouth daily. 30 tablet 0   polyethylene glycol (MIRALAX / GLYCOLAX) 17 g packet Take 17 g by mouth daily. (Patient taking differently: Take 17 g by mouth daily as needed for mild constipation.) 14 each 0   No current facility-administered medications for this visit.     ROS:  See HPI  Physical Exam:    Incision:  well healed right AKA.  Staples removed patient tolerated this well Extremities:  warm to touch Neuro: sensation intact The left LE is without ischemic changes    Assessment/Plan:  This is a 62 y.o. male who is s/p:right AKA For non healing ischemia.  He denies symptoms of ischemia on the left LE  Staples were removed today.  The right AKA stump is well  healed and viable.   I will have him follow up in 9 months with ABI for left LE baseline.  If he develops ischemia symptoms he will call our office.  He will continue ASA and Statin daily.  He has good family support.    Roxy Horseman PA-C [facility] [phone]   Clinic MD:  Donzetta Matters

## 2022-09-15 NOTE — Transitions of Care (Post Inpatient/ED Visit) (Signed)
   09/15/2022  Name: Gerald [name] MRN: [id number] DOB: 01-27-1961  Today's TOC FU Call Status: Today's TOC FU Call Status:: Unsuccessful Call (3rd Attempt) Unsuccessful Call (3rd Attempt) Date: 09/15/22  Attempted to reach the patient regarding the most recent Inpatient/ED visit.  Follow Up Plan: No further outreach attempts will be made at this time. We have been unable to contact the patient.  Johnney Killian, RN, BSN, CCM Care Management Coordinator [place]/[facility]

## 2022-09-17 ENCOUNTER — Other Ambulatory Visit: Payer: Self-pay

## 2022-09-17 DIAGNOSIS — I70221 Atherosclerosis of native arteries of extremities with rest pain, right leg: Secondary | ICD-10-CM

## 2022-09-17 DIAGNOSIS — I70298 Other atherosclerosis of native arteries of extremities, other extremity: Secondary | ICD-10-CM

## 2022-09-22 ENCOUNTER — Ambulatory Visit (INDEPENDENT_AMBULATORY_CARE_PROVIDER_SITE_OTHER): Payer: PPO

## 2022-09-22 ENCOUNTER — Encounter: Payer: Self-pay | Admitting: *Deleted

## 2022-09-22 ENCOUNTER — Ambulatory Visit (INDEPENDENT_AMBULATORY_CARE_PROVIDER_SITE_OTHER): Payer: PPO | Admitting: *Deleted

## 2022-09-22 VITALS — BP 134/86 | HR 65 | Temp 97.7°F

## 2022-09-22 VITALS — BP 134/86 | HR 65 | Temp 97.7°F | Ht 72.0 in

## 2022-09-22 DIAGNOSIS — R052 Subacute cough: Secondary | ICD-10-CM | POA: Diagnosis not present

## 2022-09-22 DIAGNOSIS — Z0000 Encounter for general adult medical examination without abnormal findings: Secondary | ICD-10-CM

## 2022-09-22 DIAGNOSIS — G9341 Metabolic encephalopathy: Secondary | ICD-10-CM | POA: Diagnosis not present

## 2022-09-22 NOTE — Assessment & Plan Note (Addendum)
Patient presents for hospital follow-up after recent admission for acute metabolic encephalopathy felt to be related to centrally acting medications and/or dehydration.  Patient was discharged with discontinuation of baclofen.  He has been doing well since discharge according to his wife.  She feels that he is back to his post-CVA baseline.  Denies any changes in speech, swallowing, mobility.  Similarly challenging exam today though does seem consistent with that upon discharge. -Continue off baclofen and Robaxin -Encouraged good hydration and nutrition

## 2022-09-22 NOTE — Patient Instructions (Signed)
Health Maintenance, Male Adopting a healthy lifestyle and getting preventive care are important in promoting health and wellness. Ask your health care provider about: The right schedule for you to have regular tests and exams. Things you can do on your own to prevent diseases and keep yourself healthy. What should I know about diet, weight, and exercise? Eat a healthy diet  Eat a diet that includes plenty of vegetables, fruits, low-fat dairy products, and lean protein. Do not eat a lot of foods that are high in solid fats, added sugars, or sodium. Maintain a healthy weight Body mass index (BMI) is a measurement that can be used to identify possible weight problems. It estimates body fat based on height and weight. Your health care provider can help determine your BMI and help you achieve or maintain a healthy weight. Get regular exercise Get regular exercise. This is one of the most important things you can do for your health. Most adults should: Exercise for at least 150 minutes each week. The exercise should increase your heart rate and make you sweat (moderate-intensity exercise). Do strengthening exercises at least twice a week. This is in addition to the moderate-intensity exercise. Spend less time sitting. Even light physical activity can be beneficial. Watch cholesterol and blood lipids Have your blood tested for lipids and cholesterol at 60 years of age, then have this test every 5 years. You may need to have your cholesterol levels checked more often if: Your lipid or cholesterol levels are high. You are older than 60 years of age. You are at high risk for heart disease. What should I know about cancer screening? Many types of cancers can be detected early and may often be prevented. Depending on your health history and family history, you may need to have cancer screening at various ages. This may include screening for: Colorectal cancer. Prostate cancer. Skin cancer. Lung  cancer. What should I know about heart disease, diabetes, and high blood pressure? Blood pressure and heart disease High blood pressure causes heart disease and increases the risk of stroke. This is more likely to develop in people who have high blood pressure readings or are overweight. Talk with your health care provider about your target blood pressure readings. Have your blood pressure checked: Every 3-5 years if you are 18-39 years of age. Every year if you are 40 years old or older. If you are between the ages of 65 and 75 and are a current or former smoker, ask your health care provider if you should have a one-time screening for abdominal aortic aneurysm (AAA). Diabetes Have regular diabetes screenings. This checks your fasting blood sugar level. Have the screening done: Once every three years after age 45 if you are at a normal weight and have a low risk for diabetes. More often and at a younger age if you are overweight or have a high risk for diabetes. What should I know about preventing infection? Hepatitis B If you have a higher risk for hepatitis B, you should be screened for this virus. Talk with your health care provider to find out if you are at risk for hepatitis B infection. Hepatitis C Blood testing is recommended for: Everyone born from 1945 through 1965. Anyone with known risk factors for hepatitis C. Sexually transmitted infections (STIs) You should be screened each year for STIs, including gonorrhea and chlamydia, if: You are sexually active and are younger than 60 years of age. You are older than 60 years of age and your   health care provider tells you that you are at risk for this type of infection. Your sexual activity has changed since you were last screened, and you are at increased risk for chlamydia or gonorrhea. Ask your health care provider if you are at risk. Ask your health care provider about whether you are at high risk for HIV. Your health care provider  may recommend a prescription medicine to help prevent HIV infection. If you choose to take medicine to prevent HIV, you should first get tested for HIV. You should then be tested every 3 months for as long as you are taking the medicine. Follow these instructions at home: Alcohol use Do not drink alcohol if your health care provider tells you not to drink. If you drink alcohol: Limit how much you have to 0-2 drinks a day. Know how much alcohol is in your drink. In the [place] one drink equals one 12 oz bottle of beer (355 mL), one 5 oz glass of wine (148 mL), or one 1 oz glass of hard liquor (44 mL). Lifestyle Do not use any products that contain nicotine or tobacco. These products include cigarettes, chewing tobacco, and vaping devices, such as e-cigarettes. If you need help quitting, ask your health care provider. Do not use street drugs. Do not share needles. Ask your health care provider for help if you need support or information about quitting drugs. General instructions Schedule regular health, dental, and eye exams. Stay current with your vaccines. Tell your health care provider if: You often feel depressed. You have ever been abused or do not feel safe at home. Summary Adopting a healthy lifestyle and getting preventive care are important in promoting health and wellness. Follow your health care provider's instructions about healthy diet, exercising, and getting tested or screened for diseases. Follow your health care provider's instructions on monitoring your cholesterol and blood pressure. This information is not intended to replace advice given to you by your health care provider. Make sure you discuss any questions you have with your health care provider. Document Revised: 10/20/2020 Document Reviewed: 10/20/2020 Elsevier Patient Education  2023 Elsevier Inc.  

## 2022-09-22 NOTE — Progress Notes (Signed)
   CC: Hospital follow-up  HPI:  Gerald [name] is a 62 y.o. with past medical history below who presents for hospital follow-up.  Please see detailed assessment and plan for HPI.  Past Medical History:  Diagnosis Date   Alcohol abuse    Cocaine abuse 2014   Hypertension    Infection of wound due to methicillin resistant Staphylococcus aureus (MRSA)    Seizures 07/2015   had first seizures about 6 months after stroke   Status epilepticus 07/27/2021   Stroke 2014   denies residual on 07/03/2015   Stroke 07/02/2015   "now weak on right side; speech problems" (07/03/2015)   Tobacco use    Review of Systems: Please see detailed assessment and plan for pertinent ROS.  Physical Exam:  Vitals:   09/22/22 1013  BP: 134/86  Pulse: 65  Temp: 97.7 F (36.5 C)  TempSrc: Oral  SpO2: 93%  Height: 6' (1.829 m)   Physical Exam Constitutional:      General: He is not in acute distress. HENT:     Head: Normocephalic and atraumatic.  Eyes:     Extraocular Movements: Extraocular movements intact.  Cardiovascular:     Rate and Rhythm: Normal rate and regular rhythm.     Heart sounds: No murmur heard. Pulmonary:     Effort: Pulmonary effort is normal.     Breath sounds: No wheezing, rhonchi or rales.  Musculoskeletal:     Cervical back: Neck supple.     Left lower leg: No edema.     Comments: Right AKA. Wound well-healed. No erythema, discharge or dehiscence.   Skin:    General: Skin is warm and dry.  Neurological:     Mental Status: He is alert and oriented to person, place, and time. Mental status is at baseline.     Comments: Difficult to obtain satisfactory neuroexam.  EOMI but unable to follow finger when asked to do so.  Halting speech.  No facial asymmetry.  Patient unable to fully follow commands.  No obvious deficits in strength or sensation at bilateral upper extremities.  Strength and sensation intact of left lower extremity.      Assessment & Plan:   See  Encounters Tab for problem based charting.  Acute metabolic encephalopathy Patient presents for hospital follow-up after recent admission for acute metabolic encephalopathy felt to be related to centrally acting medications and/or dehydration.  Patient was discharged with discontinuation of baclofen.  He has been doing well since discharge according to his wife.  She feels that he is back to his post-CVA baseline.  Denies any changes in speech, swallowing, mobility.  Similarly challenging exam today though does seem consistent with that upon discharge. -Continue off baclofen and Robaxin -Encouraged good hydration and nutrition  Subacute cough Patient reports cough going on for several weeks that is most noticeable at night before bed.  States this has been adequately managed with Robitussin.  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  Denies fevers, chills, worsening of his chronic shortness of breath, productive cough, chest pain, swelling.  Lungs clear on exam.  Oxygenating well with normal work of breathing on room air.  Suspect postnasal drip. -Trial Mucinex -Discouraged use of Robitussin  Patient discussed with Dr. Oswaldo Done
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
## 2022-09-22 NOTE — Assessment & Plan Note (Addendum)
Patient reports cough going on for several weeks that is most noticeable at night before bed.  States this has been adequately managed with Robitussin.  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  Denies fevers, chills, worsening of his chronic shortness of breath, productive cough, chest pain, swelling.  Lungs clear on exam.  Oxygenating well with normal work of breathing on room air.  Suspect postnasal drip. -Trial Mucinex -Discouraged use of Robitussin

## 2022-09-22 NOTE — Patient Instructions (Signed)
GeraldGerald [name], it was a pleasure seeing you today!  Today we discussed: Hospital follow up  - Encouraged by Gerald [name]'s progress. Keep up the good work! Cough - Take Mucinex (guaifenesin) as needed for cough.   I have ordered the following labs today:  Lab Orders  No laboratory test(s) ordered today     Tests ordered today:  none  Referrals ordered today:   Referral Orders  No referral(s) requested today     I have ordered the following medication/changed the following medications:   Stop the following medications: There are no discontinued medications.   Start the following medications: No orders of the defined types were placed in this encounter.    Follow-up: 6 months   Please make sure to arrive 15 minutes prior to your next appointment. If you arrive late, you may be asked to reschedule.   We look forward to seeing you next time. Please call our clinic at [phone] if you have any questions or concerns. The best time to call is Monday-Friday from 9am-4pm, but there is someone available 24/7. If after hours or the weekend, call the main hospital number and ask for the Internal Medicine Resident On-Call. If you need medication refills, please notify your pharmacy one week in advance and they will send [place] a request.  Thank you for letting us take part in your care. [name]!  Thank you, Adron Bene, MD

## 2022-09-24 ENCOUNTER — Telehealth: Payer: Self-pay

## 2022-09-24 DIAGNOSIS — F1410 Cocaine abuse, uncomplicated: Secondary | ICD-10-CM | POA: Diagnosis not present

## 2022-09-24 DIAGNOSIS — Z7982 Long term (current) use of aspirin: Secondary | ICD-10-CM | POA: Diagnosis not present

## 2022-09-24 DIAGNOSIS — Z4781 Encounter for orthopedic aftercare following surgical amputation: Secondary | ICD-10-CM | POA: Diagnosis not present

## 2022-09-24 DIAGNOSIS — D649 Anemia, unspecified: Secondary | ICD-10-CM | POA: Diagnosis not present

## 2022-09-24 DIAGNOSIS — I69351 Hemiplegia and hemiparesis following cerebral infarction affecting right dominant side: Secondary | ICD-10-CM | POA: Diagnosis not present

## 2022-09-24 DIAGNOSIS — S78111D Complete traumatic amputation at level between right hip and knee, subsequent encounter: Secondary | ICD-10-CM | POA: Diagnosis not present

## 2022-09-24 DIAGNOSIS — Z79899 Other long term (current) drug therapy: Secondary | ICD-10-CM | POA: Diagnosis not present

## 2022-09-24 DIAGNOSIS — Z9181 History of falling: Secondary | ICD-10-CM | POA: Diagnosis not present

## 2022-09-24 DIAGNOSIS — N281 Cyst of kidney, acquired: Secondary | ICD-10-CM | POA: Diagnosis not present

## 2022-09-24 DIAGNOSIS — F17210 Nicotine dependence, cigarettes, uncomplicated: Secondary | ICD-10-CM | POA: Diagnosis not present

## 2022-09-24 DIAGNOSIS — Z89611 Acquired absence of right leg above knee: Secondary | ICD-10-CM | POA: Diagnosis not present

## 2022-09-24 DIAGNOSIS — I10 Essential (primary) hypertension: Secondary | ICD-10-CM | POA: Diagnosis not present

## 2022-09-24 DIAGNOSIS — I70221 Atherosclerosis of native arteries of extremities with rest pain, right leg: Secondary | ICD-10-CM | POA: Diagnosis not present

## 2022-09-24 DIAGNOSIS — G4089 Other seizures: Secondary | ICD-10-CM | POA: Diagnosis not present

## 2022-09-24 DIAGNOSIS — F1010 Alcohol abuse, uncomplicated: Secondary | ICD-10-CM | POA: Diagnosis not present

## 2022-09-24 NOTE — Progress Notes (Signed)
Internal Medicine Clinic Attending  Case discussed with Dr. White  At the time of the visit.  We reviewed the resident's history and exam and pertinent patient test results.  I agree with the assessment, diagnosis, and plan of care documented in the resident's note.  

## 2022-09-24 NOTE — Telephone Encounter (Signed)
Caller: Ron Parker, PT  Concern: Requesting weekly x 8 weeks and a shrinker for the pt's stump  Resolution:  No answer, gave verbal orders and [facility] info regarding shrinker

## 2022-09-24 NOTE — Addendum Note (Signed)
Addended by: Erlinda Hong T on: 09/24/2022 09:52 AM   Modules accepted: Level of Service

## 2022-09-27 DIAGNOSIS — G40909 Epilepsy, unspecified, not intractable, without status epilepticus: Secondary | ICD-10-CM | POA: Diagnosis not present

## 2022-09-27 DIAGNOSIS — I10 Essential (primary) hypertension: Secondary | ICD-10-CM | POA: Diagnosis not present

## 2022-09-28 ENCOUNTER — Ambulatory Visit (INDEPENDENT_AMBULATORY_CARE_PROVIDER_SITE_OTHER): Payer: PPO | Admitting: Podiatry

## 2022-09-28 ENCOUNTER — Encounter: Payer: Self-pay | Admitting: Podiatry

## 2022-09-28 ENCOUNTER — Ambulatory Visit: Payer: PPO | Admitting: Adult Health

## 2022-09-28 DIAGNOSIS — Z89611 Acquired absence of right leg above knee: Secondary | ICD-10-CM

## 2022-09-28 DIAGNOSIS — M79675 Pain in left toe(s): Secondary | ICD-10-CM

## 2022-09-28 DIAGNOSIS — I739 Peripheral vascular disease, unspecified: Secondary | ICD-10-CM

## 2022-09-28 DIAGNOSIS — M79674 Pain in right toe(s): Secondary | ICD-10-CM

## 2022-09-28 DIAGNOSIS — B351 Tinea unguium: Secondary | ICD-10-CM

## 2022-09-28 NOTE — Progress Notes (Signed)
  Subjective:  Patient ID: Gerald [name], male    DOB: Dec 06, 1960,   MRN: [id number]  Chief Complaint  Patient presents with   Nail Problem    Right foot , routine foot care    62 y.o. male presents for concern of thickened elongated and painful nails that are difficult to trim. Requesting to have them trimmed today. He has a history of PAD and recently underwent AKA on the right foot.   PCP:  Adron Bene, MD    . Denies any other pedal complaints. Denies n/v/f/c.   Past Medical History:  Diagnosis Date   Alcohol abuse    Cocaine abuse 2014   Hypertension    Infection of wound due to methicillin resistant Staphylococcus aureus (MRSA)    Seizures 07/2015   had first seizures about 6 months after stroke   Status epilepticus 07/27/2021   Stroke 2014   denies residual on 07/03/2015   Stroke 07/02/2015   "now weak on right side; speech problems" (07/03/2015)   Tobacco use     Objective:  Physical Exam: Vascular: DP pulses 2/4 . 1/4 Pt Pulse on left. CFT <3 seconds. Absent hair growth on digits. No edema noted.  Skin. No lacerations or abrasions left foot. Nails 1-5 left  are thickened discolored and elongated with subungual debris.  Musculoskeletal: MMT 5/5 left lower extremities in DF, PF, Inversion and Eversion. Deceased ROM in DF of ankle joint. BKA right foot.  Neurological: Sensation intact to light touch. Protective sensation intact   Assessment:   1. Pain due to onychomycosis of toenails of both feet   2. PAD (peripheral artery disease)   3. Status post above-knee amputation of right lower extremity      Plan:  Patient was evaluated and treated and all questions answered. -Discussed and educated patient on  foot care, especially with  regards to the vascular, neurological and musculoskeletal systems.  -Mechanically debrided all nails 1-5 left using sterile nail nipper and filed with dremel without incident  -Answered all patient questions -Patient to return  in  3 months for at risk foot care -Patient advised to call the office if any problems or questions arise in the meantime.   Louann Sjogren, DPM

## 2022-10-04 DIAGNOSIS — Z79899 Other long term (current) drug therapy: Secondary | ICD-10-CM | POA: Diagnosis not present

## 2022-10-04 DIAGNOSIS — D649 Anemia, unspecified: Secondary | ICD-10-CM | POA: Diagnosis not present

## 2022-10-04 DIAGNOSIS — I10 Essential (primary) hypertension: Secondary | ICD-10-CM | POA: Diagnosis not present

## 2022-10-04 DIAGNOSIS — F17210 Nicotine dependence, cigarettes, uncomplicated: Secondary | ICD-10-CM | POA: Diagnosis not present

## 2022-10-04 DIAGNOSIS — G4089 Other seizures: Secondary | ICD-10-CM | POA: Diagnosis not present

## 2022-10-04 DIAGNOSIS — I70221 Atherosclerosis of native arteries of extremities with rest pain, right leg: Secondary | ICD-10-CM | POA: Diagnosis not present

## 2022-10-04 DIAGNOSIS — Z7982 Long term (current) use of aspirin: Secondary | ICD-10-CM | POA: Diagnosis not present

## 2022-10-04 DIAGNOSIS — N281 Cyst of kidney, acquired: Secondary | ICD-10-CM | POA: Diagnosis not present

## 2022-10-04 DIAGNOSIS — Z9181 History of falling: Secondary | ICD-10-CM | POA: Diagnosis not present

## 2022-10-04 DIAGNOSIS — S78111D Complete traumatic amputation at level between right hip and knee, subsequent encounter: Secondary | ICD-10-CM | POA: Diagnosis not present

## 2022-10-04 DIAGNOSIS — Z4781 Encounter for orthopedic aftercare following surgical amputation: Secondary | ICD-10-CM | POA: Diagnosis not present

## 2022-10-04 DIAGNOSIS — Z89611 Acquired absence of right leg above knee: Secondary | ICD-10-CM | POA: Diagnosis not present

## 2022-10-04 DIAGNOSIS — F1410 Cocaine abuse, uncomplicated: Secondary | ICD-10-CM | POA: Diagnosis not present

## 2022-10-04 DIAGNOSIS — I69351 Hemiplegia and hemiparesis following cerebral infarction affecting right dominant side: Secondary | ICD-10-CM | POA: Diagnosis not present

## 2022-10-04 DIAGNOSIS — F1010 Alcohol abuse, uncomplicated: Secondary | ICD-10-CM | POA: Diagnosis not present

## 2022-10-04 NOTE — Progress Notes (Signed)
I reviewed the AWV findings with the provider who conducted the visit. I was present in the office suite and immediately available to provide assistance and direction throughout the time the service was provided.  

## 2022-10-06 DIAGNOSIS — Z9181 History of falling: Secondary | ICD-10-CM | POA: Diagnosis not present

## 2022-10-06 DIAGNOSIS — Z79899 Other long term (current) drug therapy: Secondary | ICD-10-CM | POA: Diagnosis not present

## 2022-10-06 DIAGNOSIS — G4089 Other seizures: Secondary | ICD-10-CM | POA: Diagnosis not present

## 2022-10-06 DIAGNOSIS — I69351 Hemiplegia and hemiparesis following cerebral infarction affecting right dominant side: Secondary | ICD-10-CM | POA: Diagnosis not present

## 2022-10-06 DIAGNOSIS — N281 Cyst of kidney, acquired: Secondary | ICD-10-CM | POA: Diagnosis not present

## 2022-10-06 DIAGNOSIS — I10 Essential (primary) hypertension: Secondary | ICD-10-CM | POA: Diagnosis not present

## 2022-10-06 DIAGNOSIS — D649 Anemia, unspecified: Secondary | ICD-10-CM | POA: Diagnosis not present

## 2022-10-06 DIAGNOSIS — F1010 Alcohol abuse, uncomplicated: Secondary | ICD-10-CM | POA: Diagnosis not present

## 2022-10-06 DIAGNOSIS — F1410 Cocaine abuse, uncomplicated: Secondary | ICD-10-CM | POA: Diagnosis not present

## 2022-10-06 DIAGNOSIS — Z7982 Long term (current) use of aspirin: Secondary | ICD-10-CM | POA: Diagnosis not present

## 2022-10-06 DIAGNOSIS — Z4781 Encounter for orthopedic aftercare following surgical amputation: Secondary | ICD-10-CM | POA: Diagnosis not present

## 2022-10-06 DIAGNOSIS — I70221 Atherosclerosis of native arteries of extremities with rest pain, right leg: Secondary | ICD-10-CM | POA: Diagnosis not present

## 2022-10-06 DIAGNOSIS — Z89611 Acquired absence of right leg above knee: Secondary | ICD-10-CM | POA: Diagnosis not present

## 2022-10-06 DIAGNOSIS — S78111D Complete traumatic amputation at level between right hip and knee, subsequent encounter: Secondary | ICD-10-CM | POA: Diagnosis not present

## 2022-10-06 DIAGNOSIS — F17210 Nicotine dependence, cigarettes, uncomplicated: Secondary | ICD-10-CM | POA: Diagnosis not present

## 2022-10-11 DIAGNOSIS — I70261 Atherosclerosis of native arteries of extremities with gangrene, right leg: Secondary | ICD-10-CM | POA: Diagnosis not present

## 2022-10-11 DIAGNOSIS — R4182 Altered mental status, unspecified: Secondary | ICD-10-CM | POA: Diagnosis not present

## 2022-10-11 DIAGNOSIS — S78111A Complete traumatic amputation at level between right hip and knee, initial encounter: Secondary | ICD-10-CM | POA: Diagnosis not present

## 2022-10-11 DIAGNOSIS — G9341 Metabolic encephalopathy: Secondary | ICD-10-CM | POA: Diagnosis not present

## 2022-10-13 DIAGNOSIS — D649 Anemia, unspecified: Secondary | ICD-10-CM | POA: Diagnosis not present

## 2022-10-13 DIAGNOSIS — S78111D Complete traumatic amputation at level between right hip and knee, subsequent encounter: Secondary | ICD-10-CM | POA: Diagnosis not present

## 2022-10-13 DIAGNOSIS — Z89611 Acquired absence of right leg above knee: Secondary | ICD-10-CM | POA: Diagnosis not present

## 2022-10-13 DIAGNOSIS — Z79899 Other long term (current) drug therapy: Secondary | ICD-10-CM | POA: Diagnosis not present

## 2022-10-13 DIAGNOSIS — Z4781 Encounter for orthopedic aftercare following surgical amputation: Secondary | ICD-10-CM | POA: Diagnosis not present

## 2022-10-13 DIAGNOSIS — N281 Cyst of kidney, acquired: Secondary | ICD-10-CM | POA: Diagnosis not present

## 2022-10-13 DIAGNOSIS — Z7982 Long term (current) use of aspirin: Secondary | ICD-10-CM | POA: Diagnosis not present

## 2022-10-13 DIAGNOSIS — I10 Essential (primary) hypertension: Secondary | ICD-10-CM | POA: Diagnosis not present

## 2022-10-13 DIAGNOSIS — F1410 Cocaine abuse, uncomplicated: Secondary | ICD-10-CM | POA: Diagnosis not present

## 2022-10-13 DIAGNOSIS — F17210 Nicotine dependence, cigarettes, uncomplicated: Secondary | ICD-10-CM | POA: Diagnosis not present

## 2022-10-13 DIAGNOSIS — I69351 Hemiplegia and hemiparesis following cerebral infarction affecting right dominant side: Secondary | ICD-10-CM | POA: Diagnosis not present

## 2022-10-13 DIAGNOSIS — G4089 Other seizures: Secondary | ICD-10-CM | POA: Diagnosis not present

## 2022-10-13 DIAGNOSIS — F1010 Alcohol abuse, uncomplicated: Secondary | ICD-10-CM | POA: Diagnosis not present

## 2022-10-13 DIAGNOSIS — I70221 Atherosclerosis of native arteries of extremities with rest pain, right leg: Secondary | ICD-10-CM | POA: Diagnosis not present

## 2022-10-13 DIAGNOSIS — Z9181 History of falling: Secondary | ICD-10-CM | POA: Diagnosis not present

## 2022-10-27 ENCOUNTER — Ambulatory Visit: Payer: PPO | Admitting: Family Medicine

## 2022-11-10 DIAGNOSIS — R4182 Altered mental status, unspecified: Secondary | ICD-10-CM | POA: Diagnosis not present

## 2022-11-10 DIAGNOSIS — S78111A Complete traumatic amputation at level between right hip and knee, initial encounter: Secondary | ICD-10-CM | POA: Diagnosis not present

## 2022-11-10 DIAGNOSIS — I70261 Atherosclerosis of native arteries of extremities with gangrene, right leg: Secondary | ICD-10-CM | POA: Diagnosis not present

## 2022-11-10 DIAGNOSIS — G9341 Metabolic encephalopathy: Secondary | ICD-10-CM | POA: Diagnosis not present

## 2022-11-18 ENCOUNTER — Other Ambulatory Visit: Payer: Self-pay

## 2022-11-18 MED ORDER — AMLODIPINE-OLMESARTAN 10-40 MG PO TABS: 1.0000 | ORAL_TABLET | Freq: Every day | ORAL | 2 refills | Status: DC

## 2022-11-23 DIAGNOSIS — I69351 Hemiplegia and hemiparesis following cerebral infarction affecting right dominant side: Secondary | ICD-10-CM | POA: Diagnosis not present

## 2022-12-02 ENCOUNTER — Other Ambulatory Visit: Payer: Self-pay

## 2022-12-11 DIAGNOSIS — R4182 Altered mental status, unspecified: Secondary | ICD-10-CM | POA: Diagnosis not present

## 2022-12-11 DIAGNOSIS — I70261 Atherosclerosis of native arteries of extremities with gangrene, right leg: Secondary | ICD-10-CM | POA: Diagnosis not present

## 2022-12-11 DIAGNOSIS — G9341 Metabolic encephalopathy: Secondary | ICD-10-CM | POA: Diagnosis not present

## 2022-12-11 DIAGNOSIS — S78111A Complete traumatic amputation at level between right hip and knee, initial encounter: Secondary | ICD-10-CM | POA: Diagnosis not present

## 2022-12-17 ENCOUNTER — Other Ambulatory Visit: Payer: Self-pay | Admitting: Student

## 2022-12-17 NOTE — Telephone Encounter (Signed)
  divalproex (DEPAKOTE ER) 500 MG 24 hr tablet  [facility] - [place], [place] - [address] (Ph: [phone])

## 2022-12-20 MED ORDER — DIVALPROEX SODIUM ER 500 MG PO TB24: 1500.0000 mg | ORAL_TABLET | Freq: Every day | ORAL | 5 refills | Status: DC

## 2022-12-28 ENCOUNTER — Encounter: Payer: Self-pay | Admitting: Podiatry

## 2022-12-28 ENCOUNTER — Ambulatory Visit: Payer: PPO | Admitting: Podiatry

## 2022-12-28 DIAGNOSIS — I739 Peripheral vascular disease, unspecified: Secondary | ICD-10-CM

## 2022-12-28 DIAGNOSIS — B351 Tinea unguium: Secondary | ICD-10-CM | POA: Diagnosis not present

## 2022-12-28 DIAGNOSIS — M79674 Pain in right toe(s): Secondary | ICD-10-CM

## 2022-12-28 DIAGNOSIS — Z89611 Acquired absence of right leg above knee: Secondary | ICD-10-CM | POA: Diagnosis not present

## 2022-12-28 DIAGNOSIS — M79675 Pain in left toe(s): Secondary | ICD-10-CM | POA: Diagnosis not present

## 2022-12-28 NOTE — Progress Notes (Signed)
  Subjective:  Patient ID: Gerald [name], male    DOB: 1960/12/30,   MRN: [id number]  No chief complaint on file.   62 y.o. male presents for concern of thickened elongated and painful nails that are difficult to trim. Requesting to have them trimmed today. He has a history of PAD and recently underwent AKA on the right foot.   PCP:  Laretta Bolster, MD    . Denies any other pedal complaints. Denies n/v/f/c.   Past Medical History:  Diagnosis Date   Alcohol abuse    Cocaine abuse (HCC) 2014   Hypertension    Infection of wound due to methicillin resistant Staphylococcus aureus (MRSA)    Seizures (HCC) 07/2015   had first seizures about 6 months after stroke   Status epilepticus (HCC) 07/27/2021   Stroke (HCC) 2014   denies residual on 07/03/2015   Stroke (HCC) 07/02/2015   "now weak on right side; speech problems" (07/03/2015)   Tobacco use     Objective:  Physical Exam: Vascular: DP pulses 2/4 . 1/4 Pt Pulse on left. CFT <3 seconds. Absent hair growth on digits. No edema noted.  Skin. No lacerations or abrasions left foot. Nails 1-5 left  are thickened discolored and elongated with subungual debris.  Musculoskeletal: MMT 5/5 left lower extremities in DF, PF, Inversion and Eversion. Deceased ROM in DF of ankle joint. BKA right foot.  Neurological: Sensation intact to light touch. Protective sensation intact   Assessment:   1. Pain due to onychomycosis of toenails of both feet   2. PAD (peripheral artery disease) (HCC)   3. Status post above-knee amputation of right lower extremity (HCC)       Plan:  Patient was evaluated and treated and all questions answered. -Discussed and educated patient on  foot care, especially with  regards to the vascular, neurological and musculoskeletal systems.  -Mechanically debrided all nails 1-5 left using sterile nail nipper and filed with dremel without incident  -Answered all patient questions -Patient to return  in 3 months for at risk  foot care -Patient advised to call the office if any problems or questions arise in the meantime.   Louann Sjogren, DPM

## 2023-01-10 DIAGNOSIS — S78111A Complete traumatic amputation at level between right hip and knee, initial encounter: Secondary | ICD-10-CM | POA: Diagnosis not present

## 2023-01-10 DIAGNOSIS — G9341 Metabolic encephalopathy: Secondary | ICD-10-CM | POA: Diagnosis not present

## 2023-01-10 DIAGNOSIS — R4182 Altered mental status, unspecified: Secondary | ICD-10-CM | POA: Diagnosis not present

## 2023-01-10 DIAGNOSIS — I70261 Atherosclerosis of native arteries of extremities with gangrene, right leg: Secondary | ICD-10-CM | POA: Diagnosis not present

## 2023-01-20 ENCOUNTER — Telehealth: Payer: Self-pay | Admitting: Pharmacist

## 2023-01-20 NOTE — Telephone Encounter (Signed)
Attempted to contact patient for Medication Adherence of HTA patient taking - atorvastatin 20mg   Left HIPAA compliant voice mail requesting call back to direct phone: [phone]  Phone contact led by: Rickey Primus, PharmD Candidate   Total time with patient call and documentation of interaction: 8 minutes.

## 2023-01-20 NOTE — Telephone Encounter (Signed)
Steward Drone calls nurse line returning pharmacy call.   Pharmacy number given to patient.

## 2023-01-20 NOTE — Telephone Encounter (Signed)
Patient's spouse returns call for Medication Access inquiry.   Patient's spouse reports recently picking up 90 day supply of Atorvastatin.  She noted the prescription had no additional refills but is aware that the pharmacy can help with refill from Dr. Annitta Jersey office when needed.   She thanked me for the follow-up.  Medication supply of atorvastatin appears appropriate at this time.   Total time with patient call and documentation of interaction: 9 minutes.

## 2023-01-27 ENCOUNTER — Encounter: Payer: Self-pay | Admitting: Pharmacist

## 2023-01-27 NOTE — Progress Notes (Signed)
Reviewed chart for adherence for [facility] measure. Returned appears adherent per Dr. Tiajuana Amass filled on 12/17/22.

## 2023-02-10 DIAGNOSIS — I70261 Atherosclerosis of native arteries of extremities with gangrene, right leg: Secondary | ICD-10-CM | POA: Diagnosis not present

## 2023-02-10 DIAGNOSIS — R4182 Altered mental status, unspecified: Secondary | ICD-10-CM | POA: Diagnosis not present

## 2023-02-10 DIAGNOSIS — G9341 Metabolic encephalopathy: Secondary | ICD-10-CM | POA: Diagnosis not present

## 2023-02-10 DIAGNOSIS — S78111A Complete traumatic amputation at level between right hip and knee, initial encounter: Secondary | ICD-10-CM | POA: Diagnosis not present

## 2023-02-16 ENCOUNTER — Telehealth: Payer: Self-pay | Admitting: Student

## 2023-02-16 ENCOUNTER — Other Ambulatory Visit: Payer: Self-pay | Admitting: Adult Health

## 2023-02-16 NOTE — Telephone Encounter (Signed)
  levETIRAcetam (KEPPRA) 750 MG table  [facility] - [place], [place] - [address]

## 2023-03-03 ENCOUNTER — Other Ambulatory Visit: Payer: Self-pay | Admitting: Internal Medicine

## 2023-03-30 ENCOUNTER — Ambulatory Visit: Payer: PPO | Admitting: Podiatry

## 2023-03-30 ENCOUNTER — Encounter: Payer: Self-pay | Admitting: Podiatry

## 2023-03-30 DIAGNOSIS — M79675 Pain in left toe(s): Secondary | ICD-10-CM

## 2023-03-30 DIAGNOSIS — B351 Tinea unguium: Secondary | ICD-10-CM

## 2023-03-30 DIAGNOSIS — Z89611 Acquired absence of right leg above knee: Secondary | ICD-10-CM | POA: Diagnosis not present

## 2023-03-30 DIAGNOSIS — I739 Peripheral vascular disease, unspecified: Secondary | ICD-10-CM

## 2023-03-30 DIAGNOSIS — M79674 Pain in right toe(s): Secondary | ICD-10-CM | POA: Diagnosis not present

## 2023-03-30 NOTE — Progress Notes (Signed)
  Subjective:  Patient ID: Gerald [name], male    DOB: 1960/12/30,   MRN: [id number]  No chief complaint on file.   62 y.o. male presents for concern of thickened elongated and painful nails that are difficult to trim. Requesting to have them trimmed today. He has a history of PAD and recently underwent AKA on the right foot.   PCP:  Laretta Bolster, MD    . Denies any other pedal complaints. Denies n/v/f/c.   Past Medical History:  Diagnosis Date   Alcohol abuse    Cocaine abuse (HCC) 2014   Hypertension    Infection of wound due to methicillin resistant Staphylococcus aureus (MRSA)    Seizures (HCC) 07/2015   had first seizures about 6 months after stroke   Status epilepticus (HCC) 07/27/2021   Stroke (HCC) 2014   denies residual on 07/03/2015   Stroke (HCC) 07/02/2015   "now weak on right side; speech problems" (07/03/2015)   Tobacco use     Objective:  Physical Exam: Vascular: DP pulses 2/4 . 1/4 Pt Pulse on left. CFT <3 seconds. Absent hair growth on digits. No edema noted.  Skin. No lacerations or abrasions left foot. Nails 1-5 left  are thickened discolored and elongated with subungual debris.  Musculoskeletal: MMT 5/5 left lower extremities in DF, PF, Inversion and Eversion. Deceased ROM in DF of ankle joint. BKA right foot.  Neurological: Sensation intact to light touch. Protective sensation intact   Assessment:   1. Pain due to onychomycosis of toenails of both feet   2. PAD (peripheral artery disease) (HCC)   3. Status post above-knee amputation of right lower extremity (HCC)       Plan:  Patient was evaluated and treated and all questions answered. -Discussed and educated patient on  foot care, especially with  regards to the vascular, neurological and musculoskeletal systems.  -Mechanically debrided all nails 1-5 left using sterile nail nipper and filed with dremel without incident  -Answered all patient questions -Patient to return  in 3 months for at risk  foot care -Patient advised to call the office if any problems or questions arise in the meantime.   Louann Sjogren, DPM

## 2023-04-13 ENCOUNTER — Telehealth: Payer: Self-pay | Admitting: Adult Health

## 2023-04-13 NOTE — Telephone Encounter (Signed)
LVM and sent mychart msg informing pt of need to reschedule 04/14/23 appt due to scheduling error  If pt calls back, I have held a slot with Jessica for 04/14/23 at 7:45am

## 2023-04-14 ENCOUNTER — Ambulatory Visit: Payer: PPO | Admitting: Family Medicine

## 2023-04-18 DIAGNOSIS — I10 Essential (primary) hypertension: Secondary | ICD-10-CM | POA: Diagnosis not present

## 2023-04-18 DIAGNOSIS — I739 Peripheral vascular disease, unspecified: Secondary | ICD-10-CM | POA: Diagnosis not present

## 2023-04-18 DIAGNOSIS — I639 Cerebral infarction, unspecified: Secondary | ICD-10-CM | POA: Diagnosis not present

## 2023-04-18 DIAGNOSIS — E782 Mixed hyperlipidemia: Secondary | ICD-10-CM | POA: Diagnosis not present

## 2023-04-28 ENCOUNTER — Ambulatory Visit: Payer: PPO | Admitting: Adult Health

## 2023-05-19 ENCOUNTER — Other Ambulatory Visit: Payer: Self-pay | Admitting: Student

## 2023-05-30 ENCOUNTER — Ambulatory Visit: Payer: PPO | Admitting: Student

## 2023-05-30 ENCOUNTER — Other Ambulatory Visit: Payer: Self-pay

## 2023-05-30 VITALS — BP 146/87 | HR 72 | Temp 98.0°F | Resp 32 | Ht 72.0 in

## 2023-05-30 DIAGNOSIS — Z89611 Acquired absence of right leg above knee: Secondary | ICD-10-CM

## 2023-05-30 DIAGNOSIS — G8110 Spastic hemiplegia affecting unspecified side: Secondary | ICD-10-CM

## 2023-05-30 DIAGNOSIS — S78111A Complete traumatic amputation at level between right hip and knee, initial encounter: Secondary | ICD-10-CM

## 2023-05-30 NOTE — Patient Instructions (Signed)
Thank you, [name] for allowing [place] to provide your care today.   Today we discussed :  1.  Today we discussed providing appropriate documentation for hospital bed for use at home due to right knee amputation, and right spastic hemiplegia.   I have ordered the following labs for you:  Lab Orders  No laboratory test(s) ordered today     Tests ordered today:  None.  Referrals ordered today:    Referral Orders         Ambulatory Referral for DME      I have ordered the following medication/changed the following medications:   Stop the following medications: There are no discontinued medications.   Start the following medications: No orders of the defined types were placed in this encounter.    Follow up: 2-3 months   Remember: Please let me know if you need any medicine refills.   Should you have any questions or concerns please call the internal medicine clinic at [phone].    Laretta Bolster, MD  [facility]

## 2023-05-30 NOTE — Progress Notes (Signed)
CC: Requesting hospital bed for home use.   HPI:  [name] is a 62 y.o. male living with a history stated below and presents today  requesting hospital bed for home use due to medical commodities.  Please see problem based assessment and plan for additional details.  Past Medical History:  Diagnosis Date   Alcohol abuse    Cocaine abuse (HCC) 2014   Hypertension    Infection of wound due to methicillin resistant Staphylococcus aureus (MRSA)    Seizures (HCC) 07/2015   had first seizures about 6 months after stroke   Status epilepticus (HCC) 07/27/2021   Stroke (HCC) 2014   denies residual on 07/03/2015   Stroke (HCC) 07/02/2015   "now weak on right side; speech problems" (07/03/2015)   Tobacco use     Current Outpatient Medications on File Prior to Visit  Medication Sig Dispense Refill   acetaminophen (TYLENOL) 500 MG tablet Take 2 tablets (1,000 mg total) by mouth every 8 (eight) hours. (Patient taking differently: Take 1,000 mg by mouth every 8 (eight) hours as needed for mild pain or moderate pain.) 30 tablet 0   alum & mag hydroxide-simeth (MAALOX/MYLANTA) 200-200-20 MG/5ML suspension Take 15-30 mLs by mouth every 2 (two) hours as needed for indigestion. 355 mL 0   amLODipine-olmesartan (AZOR) 10-40 MG tablet Take 1 tablet by mouth daily. 90 tablet 2   aspirin EC 81 MG tablet Take 1 tablet (81 mg total) by mouth daily. Swallow whole. 30 tablet 12   atorvastatin (LIPITOR) 20 MG tablet Take 1 tablet (20 mg total) by mouth daily at 6 PM. 30 tablet 3   divalproex (DEPAKOTE ER) 500 MG 24 hr tablet TAKE 3 TABLETS (1,500 MG TOTAL) BY MOUTH AT BEDTIME. 90 tablet 0   feeding supplement (ENSURE ENLIVE / ENSURE PLUS) LIQD Take 237 mLs by mouth 2 (two) times daily between meals. 237 mL 12   fluticasone (FLONASE) 50 MCG/ACT nasal spray Place 1 spray into both nostrils daily. (Patient taking differently: Place 1 spray into both nostrils daily as needed for allergies.) 11.1 mL 2    guaiFENesin-dextromethorphan (ROBITUSSIN DM) 100-10 MG/5ML syrup Take 15 mLs by mouth every 4 (four) hours as needed for cough. 118 mL 0   lacosamide (VIMPAT) 50 MG TABS tablet TAKE 1 TABLET (50 MG TOTAL) BY MOUTH 2 (TWO) TIMES DAILY. 60 tablet 3   levETIRAcetam (KEPPRA) 750 MG tablet TAKE 2 TABLETS (1,500 MG TOTAL) BY MOUTH 2 (TWO) TIMES DAILY. 360 tablet 3   metoprolol tartrate (LOPRESSOR) 25 MG tablet Take 1 tablet (25 mg total) by mouth 2 (two) times daily. 60 tablet 0   ondansetron (ZOFRAN) 4 MG/2ML SOLN injection Inject 2 mLs (4 mg total) into the vein every 6 (six) hours as needed for nausea or vomiting. 2 mL 0   pantoprazole (PROTONIX) 40 MG tablet Take 1 tablet (40 mg total) by mouth daily. 30 tablet 0   polyethylene glycol (MIRALAX / GLYCOLAX) 17 g packet Take 17 g by mouth daily. (Patient taking differently: Take 17 g by mouth daily as needed for mild constipation.) 14 each 0   No current facility-administered medications on file prior to visit.    Review of Systems: ROS negative except for what is noted on the assessment and plan.  Vitals:   05/30/23 1442  BP: (!) 146/87  Pulse: 72  Resp: (!) 32  Temp: 98 F (36.7 C)  TempSrc: Oral  Height: 6' (1.829 m)    Physical Exam:  Constitutional: Not in acute distress, sitting in a wheel chair. MSK: spastic hemiparesis of upper extremity.  Extremity: Above the knee amputation of right leg.  Assessment & Plan:   Patient seen with Dr. Cleda Daub  Spastic hemiplegia affecting dominant side. 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 Exam today appears consistent  right spastic hemiparesis of upper and lower extremities, dysarthria, intact sensation. Of note, he also has a above-knee amputation of the right lower extremity in 2024. Patient history of hemiplegia with spastic paresis and Knee amputation makes positioning of the  body not feasible with the use of an ordinary bed.  He would benefit from the use of hospital bed at home.  He would also benefit from additional support, as patient wife is the sole care taker. - Ambulatory referral for DME -For home use only DME hospital bed placed.   Laretta Bolster, MD [facility] Internal [facility], PGY-1 Phone: [phone] Date 05/30/2023 Time 4:27 PM

## 2023-05-30 NOTE — Assessment & Plan Note (Addendum)
Spastic hemiplegia affecting dominant side. 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 Exam today appears consistent  right spastic hemiparesis of upper and lower extremities, dysarthria, intact sensation. Of note, he also has a above-knee amputation of the right lower extremity in 2024. Patient history of hemiplegia with spastic paresis and Knee amputation makes positioning of the body not feasible with the use of an ordinary bed.  He would benefit from the use of hospital bed at home.  He would also benefit from additional support, as patient wife is the sole care taker. - Ambulatory referral for DME -For home use only DME hospital bed placed.

## 2023-06-02 ENCOUNTER — Telehealth: Payer: Self-pay | Admitting: Adult Health

## 2023-06-02 ENCOUNTER — Ambulatory Visit: Payer: PPO | Admitting: Adult Health

## 2023-06-02 NOTE — Telephone Encounter (Signed)
Pt's wife called to cx appt due to pt not feeling well. Rescheduled and wait listed

## 2023-06-06 NOTE — Progress Notes (Signed)
Internal Medicine Clinic Attending  I was physically present during the key portions of the resident provided service and participated in the medical decision making of patient's management care. I reviewed pertinent patient test results.  The assessment, diagnosis, and plan were formulated together and I agree with the documentation in the resident's note.  Gust Rung, DO

## 2023-06-30 ENCOUNTER — Other Ambulatory Visit: Payer: Self-pay

## 2023-06-30 ENCOUNTER — Other Ambulatory Visit: Payer: Self-pay | Admitting: Student

## 2023-06-30 DIAGNOSIS — I70298 Other atherosclerosis of native arteries of extremities, other extremity: Secondary | ICD-10-CM

## 2023-07-05 ENCOUNTER — Ambulatory Visit (INDEPENDENT_AMBULATORY_CARE_PROVIDER_SITE_OTHER): Payer: Medicare HMO | Admitting: Podiatry

## 2023-07-05 ENCOUNTER — Encounter: Payer: Self-pay | Admitting: Podiatry

## 2023-07-05 DIAGNOSIS — M79674 Pain in right toe(s): Secondary | ICD-10-CM

## 2023-07-05 DIAGNOSIS — M79675 Pain in left toe(s): Secondary | ICD-10-CM

## 2023-07-05 DIAGNOSIS — B351 Tinea unguium: Secondary | ICD-10-CM

## 2023-07-05 DIAGNOSIS — I739 Peripheral vascular disease, unspecified: Secondary | ICD-10-CM

## 2023-07-05 NOTE — Progress Notes (Signed)
  Subjective:  Patient ID: Gerald [name], male    DOB: 1961-06-09,   MRN: [id number]  Chief Complaint  Patient presents with   Nail Problem    rfc    63 y.o. male presents for concern of thickened elongated and painful nails that are difficult to trim. Requesting to have them trimmed today. He has a history of PAD and recently underwent AKA on the right foot.   PCP:  Laretta Bolster, MD    . Denies any other pedal complaints. Denies n/v/f/c.   Past Medical History:  Diagnosis Date   Alcohol abuse    Cocaine abuse (HCC) 2014   Hypertension    Infection of wound due to methicillin resistant Staphylococcus aureus (MRSA)    Seizures (HCC) 07/2015   had first seizures about 6 months after stroke   Status epilepticus (HCC) 07/27/2021   Stroke (HCC) 2014   denies residual on 07/03/2015   Stroke (HCC) 07/02/2015   "now weak on right side; speech problems" (07/03/2015)   Tobacco use     Objective:  Physical Exam: Vascular: DP pulses 2/4 . 1/4 Pt Pulse on left. CFT <3 seconds. Absent hair growth on digits. No edema noted.  Skin. No lacerations or abrasions left foot. Nails 1-5 left  are thickened discolored and elongated with subungual debris.  Musculoskeletal: MMT 5/5 left lower extremities in DF, PF, Inversion and Eversion. Deceased ROM in DF of ankle joint. AKA right foot.  Neurological: Sensation intact to light touch. Protective sensation intact   Assessment:   1. Pain due to onychomycosis of toenails of both feet   2. PAD (peripheral artery disease) (HCC)       Plan:  Patient was evaluated and treated and all questions answered. -Discussed and educated patient on  foot care, especially with  regards to the vascular, neurological and musculoskeletal systems.  -Mechanically debrided all nails 1-5 left using sterile nail nipper and filed with dremel without incident  -Answered all patient questions -Patient to return  in 3 months for at risk foot care -Patient advised to  call the office if any problems or questions arise in the meantime.   Louann Sjogren, DPM

## 2023-07-06 ENCOUNTER — Ambulatory Visit (HOSPITAL_COMMUNITY): Payer: Medicare HMO

## 2023-07-06 ENCOUNTER — Ambulatory Visit: Payer: Medicare HMO

## 2023-07-18 DIAGNOSIS — I739 Peripheral vascular disease, unspecified: Secondary | ICD-10-CM | POA: Diagnosis not present

## 2023-07-18 DIAGNOSIS — E782 Mixed hyperlipidemia: Secondary | ICD-10-CM | POA: Diagnosis not present

## 2023-07-18 DIAGNOSIS — I639 Cerebral infarction, unspecified: Secondary | ICD-10-CM | POA: Diagnosis not present

## 2023-07-18 DIAGNOSIS — I10 Essential (primary) hypertension: Secondary | ICD-10-CM | POA: Diagnosis not present

## 2023-07-27 ENCOUNTER — Ambulatory Visit: Payer: Medicare HMO

## 2023-07-27 VITALS — Ht 72.0 in

## 2023-07-27 DIAGNOSIS — Z5982 Transportation insecurity: Secondary | ICD-10-CM

## 2023-07-27 DIAGNOSIS — Z5912 Inadequate housing utilities: Secondary | ICD-10-CM

## 2023-07-27 DIAGNOSIS — Z0000 Encounter for general adult medical examination without abnormal findings: Secondary | ICD-10-CM | POA: Diagnosis not present

## 2023-07-27 DIAGNOSIS — Z5941 Food insecurity: Secondary | ICD-10-CM

## 2023-07-27 NOTE — Patient Instructions (Addendum)
Mr. Bovey , Thank you for taking time to come for your Medicare Wellness Visit. I appreciate your ongoing commitment to your health goals. Please review the following plan we discussed and let me know if I can assist you in the future.   Referrals/Orders/Follow-Ups/Clinician Recommendations: Yes; Referrals have been placed for Care Management to assist you with getting an eye exam and getting some help with transportation, food insecurities and paying utilities. Please continue to keep up with all of your appointments with Dr. Carron Brazen and any other specialists that you may see.  Have a great year!!!!  This is a list of the screening recommended for you and due dates:  Health Maintenance  Topic Date Due   Flu Shot  01/13/2023   COVID-19 Vaccine (5 - 2024-25 season) 02/13/2023   Medicare Annual Wellness Visit  07/26/2024   Colon Cancer Screening  03/01/2029   DTaP/Tdap/Td vaccine (2 - Td or Tdap) 02/12/2032   Pneumococcal Vaccination  Completed   Hepatitis C Screening  Completed   HIV Screening  Completed   Zoster (Shingles) Vaccine  Completed   HPV Vaccine  Aged Out    Advanced directives: (Copy Requested) Please bring a copy of your health care power of attorney and living will to the office to be added to your chart at your convenience.  Next Medicare Annual Wellness Visit scheduled for next year: Yes
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
## 2023-07-29 ENCOUNTER — Telehealth: Payer: Self-pay | Admitting: *Deleted

## 2023-07-29 NOTE — Progress Notes (Signed)
Complex Care Management Note Care Guide Note  07/29/2023 Name: Gerald [name] MRN: [id number] DOB: May 05, 1961   Complex Care Management Outreach Attempts: An unsuccessful telephone outreach was attempted today to offer the patient information about available complex care management services.  Follow Up Plan:  Additional outreach attempts will be made to offer the patient complex care management information and services.   Encounter Outcome:  No Answer  Gwenevere Ghazi  [facility] Health  [facility], [facility] Guide  Direct Dial: [phone]  Fax [phone]

## 2023-08-02 NOTE — Progress Notes (Unsigned)
Complex Care Management Note Care Guide Note  08/02/2023 Name: Gerald [name] MRN: [id number] DOB: 09/30/1960   Complex Care Management Outreach Attempts: A second unsuccessful outreach was attempted today to offer the patient with information about available complex care management services.  Follow Up Plan:  Additional outreach attempts will be made to offer the patient complex care management information and services.   Encounter Outcome:  No Answer  Gwenevere Ghazi  [facility] Health  [facility], [facility] Guide  Direct Dial: [phone]  Fax [phone]

## 2023-08-03 NOTE — Progress Notes (Signed)
Complex Care Management Note  Care Guide Note 08/03/2023 Name: Gerald [name] MRN: [id number] DOB: [birth year]  Gerald [name] is a 63 y.o. year old male who sees Laretta Bolster, MD for primary care. I reached out to Arlis Porta by phone today to offer complex care management services.  [name] significant other Brayton Layman DPR on file was given information about Complex Care Management services today including:   The Complex Care Management services include support from the care team which includes your Nurse Care Manager, Clinical Social Worker, or Pharmacist.  The Complex Care Management team is here to help remove barriers to the health concerns and goals most important to you. Complex Care Management services are voluntary, and the patient may decline or stop services at any time by request to their care team member.   Complex Care Management Consent Status: Patient agreed to services and verbal consent obtained.   Follow up plan:  Telephone appointment with complex care management team member scheduled for:  2/25  Encounter Outcome:  Patient Scheduled Gwenevere Ghazi  [facility] Health  [facility], [facility] Guide  Direct Dial: [phone]  Fax [phone]

## 2023-08-04 ENCOUNTER — Other Ambulatory Visit: Payer: Self-pay | Admitting: Student

## 2023-08-04 ENCOUNTER — Telehealth: Payer: Self-pay | Admitting: *Deleted

## 2023-08-04 NOTE — Progress Notes (Signed)
Complex Care Management Note Care Guide Note  08/04/2023 Name: VENUS RUHE MRN: [id number] DOB: Dec 22, 1960   Complex Care Management Outreach Attempts: An unsuccessful telephone outreach was attempted today to offer the patient information about available complex care management services.  Follow Up Plan:  Additional outreach attempts will be made to offer the patient complex care management information and services.   Encounter Outcome:  No Answer  [facility] Guide  Direct Dial:[phone] Fax:[phone] Website: [email or web address]

## 2023-08-09 ENCOUNTER — Ambulatory Visit: Payer: Self-pay | Admitting: Licensed Clinical Social Worker

## 2023-08-09 NOTE — Patient Outreach (Signed)
 Care Coordination   08/09/2023 Name: Gerald [name] MRN: [id number] DOB: [birth year]   Care Coordination Outreach Attempts:  An unsuccessful telephone outreach was attempted today to offer the patient information about available complex care management services.  Follow Up Plan:  Additional outreach attempts will be made to offer the patient complex care management information and services.   Encounter Outcome:  No Answer   Care Coordination Interventions:  No, not indicated    Kenton Kingfisher, LCSW [facility], [facility] Health Licensed Clinical Social Worker Care Coordinator [phone]

## 2023-08-10 ENCOUNTER — Other Ambulatory Visit: Payer: Self-pay

## 2023-08-10 MED ORDER — LACOSAMIDE 50 MG PO TABS: 50.0000 mg | ORAL_TABLET | Freq: Two times a day (BID) | ORAL | 2 refills | Status: DC

## 2023-08-10 MED ORDER — AMLODIPINE-OLMESARTAN 10-40 MG PO TABS: 1.0000 | ORAL_TABLET | Freq: Every day | ORAL | 2 refills | Status: DC

## 2023-08-15 ENCOUNTER — Telehealth: Payer: Self-pay | Admitting: *Deleted

## 2023-08-15 ENCOUNTER — Other Ambulatory Visit: Payer: Self-pay

## 2023-08-15 MED ORDER — LACOSAMIDE 50 MG PO TABS: 50.0000 mg | ORAL_TABLET | Freq: Two times a day (BID) | ORAL | 2 refills | Status: DC

## 2023-08-15 NOTE — Telephone Encounter (Addendum)
 last rx was sent as print instead of normal. I am unable to resend the rx under normal the rx keeps reverting back to print. Please send in refill.

## 2023-08-15 NOTE — Progress Notes (Signed)
 Complex Care Management Note Care Guide Note  08/15/2023 Name: Gerald [name] MRN: [id number] DOB: [birth year]   Complex Care Management Outreach Attempts: A third unsuccessful outreach was attempted today to offer the patient with information about available complex care management services.  Follow Up Plan:  No further outreach attempts will be made at this time. We have been unable to contact the patient to offer or enroll patient in complex care management services.  Encounter Outcome:  No Answer [facility] Guide  Direct [phone] [phone] Website: [email or web address]

## 2023-08-18 ENCOUNTER — Telehealth: Payer: Self-pay | Admitting: *Deleted

## 2023-08-18 NOTE — Progress Notes (Signed)
 Complex Care Management Care Guide Note  08/18/2023 Name: [name] MRN: [id number] DOB: Dec 02, 1960  Gerald [name] is a 63 y.o. year old male who is a primary care patient of Koomson, Lisbeth Ply, MD and is actively engaged with the care management team. I reached out to Arlis Porta by phone today to assist with re-scheduling  with the Licensed Clinical Child psychotherapist.  Follow up plan: Unsuccessful telephone outreach attempt made. A HIPAA compliant phone message was left for the patient providing contact information and requesting a return call.  Gwenevere Ghazi  [facility] Health  [facility], [facility] Guide  Direct Dial: [phone]  Fax [phone]

## 2023-08-18 NOTE — Progress Notes (Signed)
 Complex Care Management Care Guide Note  08/18/2023 Name: Gerald [name] MRN: [id number] DOB: May 20, 1961  Gerald [name] is a 63 y.o. year old male who is a primary care patient of Koomson, Lisbeth Ply, MD and is actively engaged with the care management team. I reached out to Arlis Porta by phone today to assist with re-scheduling  with the Licensed Clinical Child psychotherapist.  Follow up plan: Telephone appointment with complex care management team member scheduled for:  08/19/23  Gwenevere Ghazi  [facility]  [facility], [facility] Guide  Direct Dial: [phone]  Fax [phone]

## 2023-08-18 NOTE — Telephone Encounter (Signed)
Refill request already completed

## 2023-08-19 ENCOUNTER — Ambulatory Visit: Payer: Self-pay | Admitting: Licensed Clinical Social Worker

## 2023-08-19 NOTE — Patient Instructions (Signed)
 Visit Information  Thank you for taking time to visit with me today. Please don't hesitate to contact me if I can be of assistance to you.   Following are the goals we discussed today:   Goals Addressed             This Visit's Progress    Increase Access to [facility] Coordination Interventions: Assessed Social Determinants of Health Reviewed all upcoming appointments in Epic system Motivational Interviewing employed Solution-Focused Strategies employed:  Active listening / Reflection utilized  Industrial/product designer Provided  SCAT transportation (Will mail information on [employer]) Community food options (Will mail list of community Resources) Solution-Focused Strategies employed:           Our next appointment is by telephone on 09/09/2023.  Please call the care guide team at [phone] if you need to cancel or reschedule your appointment.   If you are experiencing a Mental Health or Behavioral Health Crisis or need someone to talk to, please call the Suicide and Crisis Lifeline: 988  Patient verbalizes understanding of instructions and care plan provided today and agrees to view in MyChart. Active MyChart status and patient understanding of how to access instructions and care plan via MyChart confirmed with patient.     Telephone follow up appointment with care management team member scheduled for: 09/09/2023

## 2023-08-19 NOTE — Patient Outreach (Signed)
 Care Coordination   Initial Visit Note   08/19/2023 Name: Gerald [name] MRN: [id number] DOB: Jul 20, 1960  [name] is a 63 y.o. year old male who sees Gerald Bolster, MD for primary care. I  spoke with spouse Gerald [name] over the phone today.  What matters to the patients health and wellness today?  Increasing access to community resources.    Goals Addressed             This Visit's Progress    Increase Access to [facility] Coordination Interventions: Assessed Social Determinants of Health Reviewed all upcoming appointments in Epic system Motivational Interviewing employed Solution-Focused Strategies employed:  Active listening / Reflection utilized  Industrial/product designer Provided  [employer] transportation (Will mail information on Control and instrumentation engineer) Community food options (Will mail list of community Resources) Solution-Focused Strategies employed:           SDOH assessments and interventions completed:  Yes  SDOH Interventions Today    Flowsheet Row Most Recent Value  SDOH Interventions   Food Insecurity Interventions Community Resources Provided  Housing Interventions Intervention Not Indicated  Transportation Interventions Intervention Not Indicated  Utilities Interventions Intervention Not Indicated        Care Coordination Interventions:  Yes, provided   Interventions Today    Flowsheet Row Most Recent Value  Chronic Disease   Chronic disease during today's visit Other  [SDOH]  General Interventions   General Interventions Discussed/Reviewed General Interventions Discussed, [employer]  [Pt's spouse reported they are on food stamps but it is a small amount and they run out of food sometimes beofre the end of the month. They have not used local food banks and CSW will mail list.]  Education Interventions   Education Provided Provided Education  [CSW and pt's spouse reviewed there transportation options, currently a  nephew is providing their transportation. CSW discussed community options and will mail information on transportation options,]        Follow up plan: Follow up call scheduled for 09/09/2023    Encounter Outcome:  Patient Visit Completed   Gerald Kingfisher, LCSW [facility] Licensed Clinical Social Worker Care Coordinator [phone]

## 2023-08-29 ENCOUNTER — Ambulatory Visit (INDEPENDENT_AMBULATORY_CARE_PROVIDER_SITE_OTHER): Payer: Medicare HMO | Admitting: Student

## 2023-08-29 ENCOUNTER — Other Ambulatory Visit: Payer: Self-pay | Admitting: Student

## 2023-08-29 VITALS — BP 133/82 | HR 56 | Temp 98.0°F

## 2023-08-29 DIAGNOSIS — I10 Essential (primary) hypertension: Secondary | ICD-10-CM

## 2023-08-29 DIAGNOSIS — R310 Gross hematuria: Secondary | ICD-10-CM

## 2023-08-29 DIAGNOSIS — I739 Peripheral vascular disease, unspecified: Secondary | ICD-10-CM | POA: Diagnosis not present

## 2023-08-29 MED ORDER — AMLODIPINE-OLMESARTAN 10-40 MG PO TABS: 1.0000 | ORAL_TABLET | Freq: Every day | ORAL | 5 refills | Status: DC

## 2023-08-29 NOTE — Patient Instructions (Signed)
 Thank you so much for coming to the clinic today!   We are going to check your cholesterol and kidney function today. I have sent in refills of the blood pressure medication. Please start checking it at home, it should be below 130/80. If consistently above that, please give [place] a call.   If you have any questions please feel free to the call the clinic at anytime at [phone]. It was a pleasure seeing you!  Best, Dr. Thomasene Ripple

## 2023-08-29 NOTE — Assessment & Plan Note (Signed)
 Rechecking Lipid panel today, last one two years ago showed a very well controlled LDL. On Rosuvastatin.

## 2023-08-29 NOTE — Progress Notes (Signed)
 Internal Medicine Clinic Attending  Case discussed with the resident at the time of the visit.  We reviewed the resident's history and exam and pertinent patient test results.  I agree with the assessment, diagnosis, and plan of care documented in the resident's note.

## 2023-08-29 NOTE — Assessment & Plan Note (Signed)
 Pt presents for follow up regarding his blood pressure. His only medication is Azor 10-40. He has not been able to take it though because he needed a visit to resupply his medications. He hasn't had it for about a week and a half. Blood pressure today elevated to 148/85, and repeat is 133/82. He has been tolerating this medication well with no side effects, will continue.   Plan:  - Refill Azor 10-40mg  - Recommended pt check BP at home 3x a week

## 2023-08-29 NOTE — Progress Notes (Signed)
 CC: HTN  HPI:  Gerald [name] is a 63 y.o. male living with a history stated below and presents today for HTN. Please see problem based assessment and plan for additional details.  Past Medical History:  Diagnosis Date   Alcohol abuse    Cocaine abuse (HCC) 2014   Hypertension    Infection of wound due to methicillin resistant Staphylococcus aureus (MRSA)    Seizures (HCC) 07/2015   had first seizures about 6 months after stroke   Status epilepticus (HCC) 07/27/2021   Stroke (HCC) 2014   denies residual on 07/03/2015   Stroke (HCC) 07/02/2015   "now weak on right side; speech problems" (07/03/2015)   Tobacco use     Current Outpatient Medications on File Prior to Visit  Medication Sig Dispense Refill   acetaminophen (TYLENOL) 500 MG tablet Take 2 tablets (1,000 mg total) by mouth every 8 (eight) hours. (Patient taking differently: Take 1,000 mg by mouth every 8 (eight) hours as needed for mild pain or moderate pain.) 30 tablet 0   alum & mag hydroxide-simeth (MAALOX/MYLANTA) 200-200-20 MG/5ML suspension Take 15-30 mLs by mouth every 2 (two) hours as needed for indigestion. 355 mL 0   aspirin EC 81 MG tablet Take 1 tablet (81 mg total) by mouth daily. Swallow whole. 30 tablet 12   atorvastatin (LIPITOR) 20 MG tablet Take 1 tablet (20 mg total) by mouth daily at 6 PM. 30 tablet 3   divalproex (DEPAKOTE ER) 500 MG 24 hr tablet TAKE 3 TABLETS (1,500 MG TOTAL) BY MOUTH AT BEDTIME. 90 tablet 0   feeding supplement (ENSURE ENLIVE / ENSURE PLUS) LIQD Take 237 mLs by mouth 2 (two) times daily between meals. 237 mL 12   fluticasone (FLONASE) 50 MCG/ACT nasal spray Place 1 spray into both nostrils daily. (Patient taking differently: Place 1 spray into both nostrils daily as needed for allergies.) 11.1 mL 2   guaiFENesin-dextromethorphan (ROBITUSSIN DM) 100-10 MG/5ML syrup Take 15 mLs by mouth every 4 (four) hours as needed for cough. 118 mL 0   lacosamide (VIMPAT) 50 MG TABS tablet Take 1  tablet (50 mg total) by mouth 2 (two) times daily. 60 tablet 2   levETIRAcetam (KEPPRA) 750 MG tablet TAKE 2 TABLETS (1,500 MG TOTAL) BY MOUTH 2 (TWO) TIMES DAILY. 360 tablet 3   metoprolol tartrate (LOPRESSOR) 25 MG tablet Take 1 tablet (25 mg total) by mouth 2 (two) times daily. 60 tablet 0   ondansetron (ZOFRAN) 4 MG/2ML SOLN injection Inject 2 mLs (4 mg total) into the vein every 6 (six) hours as needed for nausea or vomiting. 2 mL 0   pantoprazole (PROTONIX) 40 MG tablet Take 1 tablet (40 mg total) by mouth daily. 30 tablet 0   polyethylene glycol (MIRALAX / GLYCOLAX) 17 g packet Take 17 g by mouth daily. (Patient taking differently: Take 17 g by mouth daily as needed for mild constipation.) 14 each 0   No current facility-administered medications on file prior to visit.    Family History  Problem Relation Age of Onset   Hypertension Mother    Hypertension Father    Stroke Paternal Aunt     Social History   Socioeconomic History   Marital status: Significant Other    Spouse name: Not on file   Number of children: Not on file   Years of education: Not on file   Highest education level: Not on file  Occupational History   Not on file  Tobacco Use  Smoking status: Some Days    Current packs/day: 0.00    Average packs/day: 0.3 packs/day for 34.0 years (8.5 ttl pk-yrs)    Types: Cigarettes    Start date: 06/30/1981    Last attempt to quit: 07/01/2015    Years since quitting: 8.1    Passive exposure: Current   Smokeless tobacco: Never   Tobacco comments:    smokes 1-2 cigarette daily  Vaping Use   Vaping status: Never Used  Substance and Sexual Activity   Alcohol use: Yes    Alcohol/week: 1.0 - 2.0 standard drink of alcohol    Types: 1 - 2 Cans of beer per week    Comment: occassionally -beer   Drug use: No    Types: Cocaine    Comment: 07/03/2015 "hasn't had any for some time"   Sexual activity: Yes  Other Topics Concern   Not on file  Social History Narrative   Not  on file   Social Drivers of Health   Financial Resource Strain: Medium Risk (07/27/2023)   Overall Financial Resource Strain (CARDIA)    Difficulty of Paying Living Expenses: Somewhat hard  Food Insecurity: Food Insecurity Present (08/19/2023)   Hunger Vital Sign    Worried About Running Out of Food in the Last Year: Sometimes true    Ran Out of Food in the Last Year: Sometimes true  Transportation Needs: Unmet Transportation Needs (08/19/2023)   PRAPARE - Transportation    Lack of Transportation (Medical): No    Lack of Transportation (Non-Medical): Yes  Physical Activity: Patient Unable To Answer (07/27/2023)   Exercise Vital Sign    Days of Exercise per Week: Patient unable to answer    Minutes of Exercise per Session: Patient unable to answer  Stress: Stress Concern Present (07/27/2023)   [employer] of Occupational Health - Occupational Stress Questionnaire    Feeling of Stress : To some extent  Social Connections: Moderately Integrated (07/27/2023)   Social Connection and Isolation Panel [NHANES]    Frequency of Communication with Friends and Family: More than three times a week    Frequency of Social Gatherings with Friends and Family: More than three times a week    Attends Religious Services: More than 4 times per year    Active Member of [employer] or Organizations: No    Attends Banker Meetings: Never    Marital Status: Married  Catering manager Violence: Not At Risk (08/19/2023)   Humiliation, Afraid, Rape, and Kick questionnaire    Fear of Current or Ex-Partner: No    Emotionally Abused: No    Physically Abused: No    Sexually Abused: No    Review of Systems: ROS negative except for what is noted on the assessment and plan.  Vitals:   08/29/23 1057 08/29/23 1109  BP: (!) 148/85 133/82  Pulse: (!) 56 (!) 56  Temp: 98 F (36.7 C)   TempSrc: Oral     Physical Exam: Constitutional: well-appearing male  in no acute distress Cardiovascular: regular rate  and rhythm, no m/r/g Pulmonary/Chest: normal work of breathing on room air, lungs clear to auscultation bilaterally MSK: normal bulk and tone, s/p R AKA Neurological: alert & oriented x 3, right sided hemiplegia   Assessment & Plan:   Essential hypertension Pt presents for follow up regarding his blood pressure. His only medication is Azor 10-40. He has not been able to take it though because he needed a visit to resupply his medications. He hasn't had it for  about a week and a half. Blood pressure today elevated to 148/85, and repeat is 133/82. He has been tolerating this medication well with no side effects, will continue.   Plan:  - Refill Azor 10-40mg  - Recommended pt check BP at home 3x a week  Gross hematuria Repeat UA in March of last year did not show any hemoglobinuria, and he denies symptoms of hematuria at this time. Will resolve this problem.   PAD (peripheral artery disease) (HCC) Rechecking Lipid panel today, last one two years ago showed a very well controlled LDL. On Rosuvastatin.    Patient discussed with Dr. Cherrie Gauze, M.D. [facility] Internal Medicine, PGY-2 Pager: [phone] Date 08/29/2023 Time 11:21 AM

## 2023-08-29 NOTE — Telephone Encounter (Unsigned)
 Copied from CRM [id number]. Topic: Clinical - Medication Refill >> Aug 29, 2023  1:35 PM Everette Rank wrote: Most Recent Primary Care Visit:  Provider: Mickeal Needy  Department: [facility]  Visit Type: MEDICARE AWV, SEQUENTIAL  Date: 07/27/2023  Medication: lacosamide (VIMPAT) 50 MG TABS tablet  Has the patient contacted their pharmacy? Yes (Agent: If no, request that the patient contact the pharmacy for the refill. If patient does not wish to contact the pharmacy document the reason why and proceed with request.) (Agent: If yes, when and what did the pharmacy advise?)  Is this the correct pharmacy for this prescription? Yes If no, delete pharmacy and type the correct one.  This is the patient's preferred pharmacy:  [facility] - [place], [place] - [address] [place] [address] Phone: [phone] Fax: [phone]   Has the prescription been filled recently? Yes  Is the patient out of the medication? Yes  Has the patient been seen for an appointment in the last year OR does the patient have an upcoming appointment? Yes  Can we respond through MyChart? Yes  Agent: Please be advised that Rx refills may take up to 3 business days. We ask that you follow-up with your pharmacy.

## 2023-08-29 NOTE — Assessment & Plan Note (Signed)
 Repeat UA in March of last year did not show any hemoglobinuria, and he denies symptoms of hematuria at this time. Will resolve this problem.

## 2023-08-30 ENCOUNTER — Encounter: Payer: Self-pay | Admitting: Student

## 2023-08-30 LAB — BMP8+ANION GAP
Anion Gap: 12 mmol/L (ref 10.0–18.0)
BUN/Creatinine Ratio: 15 (ref 10–24)
BUN: 10 mg/dL (ref 8–27)
CO2: 26 mmol/L (ref 20–29)
Calcium: 9.3 mg/dL (ref 8.6–10.2)
Chloride: 104 mmol/L (ref 96–106)
Creatinine, Ser: 0.67 mg/dL — ABNORMAL LOW (ref 0.76–1.27)
Glucose: 85 mg/dL (ref 70–99)
Potassium: 4.2 mmol/L (ref 3.5–5.2)
Sodium: 142 mmol/L (ref 134–144)
eGFR: 107 mL/min/1.73

## 2023-08-30 LAB — LIPID PANEL
Chol/HDL Ratio: 2.6 ratio (ref 0.0–5.0)
Cholesterol, Total: 116 mg/dL (ref 100–199)
HDL: 44 mg/dL (ref >39)
LDL Chol Calc (NIH): 60 mg/dL (ref 0–99)
Triglycerides: 54 mg/dL (ref 0–149)
VLDL Cholesterol Cal: 12 mg/dL (ref 5–40)

## 2023-09-02 ENCOUNTER — Other Ambulatory Visit: Payer: Self-pay | Admitting: Student

## 2023-09-02 NOTE — Telephone Encounter (Signed)
 Copied from CRM [id number]. Topic: Clinical - Medication Refill >> Sep 02, 2023 12:43 PM Louie Boston wrote: Most Recent Primary Care Visit:  Provider: Olegario Messier  [facility]: [facility]  Visit Type: OPEN ESTABLISHED  Date: 08/29/2023  Medication: lacosamide (VIMPAT) 50 MG TABS tablet  Has the patient contacted their pharmacy? Yes (Agent: If no, request that the patient contact the pharmacy for the refill. If patient does not wish to contact the pharmacy document the reason why and proceed with request.) (Agent: If yes, when and what did the pharmacy advise?)  Patient called pharmacy and was advised that they have not received any prescription refill. Please refill for patient as he is out of medication.  Is this the correct pharmacy for this prescription? Yes If no, delete pharmacy and type the correct one.  This is the patient's preferred pharmacy:  [facility] - [place], [place] - [address] [place] [address] Phone: [phone] Fax: [phone]   Has the prescription been filled recently? No  Is the patient out of the medication? Yes  Has the patient been seen for an appointment in the last year OR does the patient have an upcoming appointment? No  Can we respond through MyChart? Yes  Agent: Please be advised that Rx refills may take up to 3 business days. We ask that you follow-up with your pharmacy.

## 2023-09-06 NOTE — Telephone Encounter (Signed)
 Copied from CRM [id number]. Topic: Clinical - Medication Question >> Sep 06, 2023  9:15 AM Gerald [name] wrote: Reason for CRM: lacosamide (VIMPAT) 50 MG TABS tablet-Patient [name] calling on update for medication. They tried calling for refill on 03/17 and 03/21 but pharmacy states nothing yet. Called and transferred to office

## 2023-09-06 NOTE — Telephone Encounter (Signed)
 On 08/15/23, rx was written as "Print"; please re-send rx electronically. Thanks

## 2023-09-07 ENCOUNTER — Other Ambulatory Visit: Payer: Self-pay | Admitting: Student

## 2023-09-07 NOTE — Telephone Encounter (Signed)
 Copied from CRM [id number]. Topic: Clinical - Medication Refill >> Sep 07, 2023  1:24 PM Everette Rank wrote: Most Recent Primary Care Visit:  Provider: Olegario Messier  [facility]: [facility]  Visit Type: OPEN ESTABLISHED  Date: 08/29/2023  Medication: divalproex (DEPAKOTE ER) 500 MG 24 hr tablet   Has the patient contacted their pharmacy? Yes (Agent: If no, request that the patient contact the pharmacy for the refill. If patient does not wish to contact the pharmacy document the reason why and proceed with request.) (Agent: If yes, when and what did the pharmacy advise?)  Is this the correct pharmacy for this prescription? Yes If no, delete pharmacy and type the correct one.  This is the patient's preferred pharmacy:  [facility] - [place], [place] - [address] [place] [address] Phone: [phone] Fax: [phone]   Has the prescription been filled recently? No  Is the patient out of the medication? Yes  Has the patient been seen for an appointment in the last year OR does the patient have an upcoming appointment? Yes  Can we respond through MyChart? Yes  Agent: Please be advised that Rx refills may take up to 3 business days. We ask that you follow-up with your pharmacy.

## 2023-09-09 ENCOUNTER — Encounter: Payer: Self-pay | Admitting: Licensed Clinical Social Worker

## 2023-09-13 ENCOUNTER — Telehealth: Payer: Self-pay | Admitting: Adult Health

## 2023-09-13 NOTE — Telephone Encounter (Signed)
 LVM and sent mychart msg informing pt of need to reschedule 12/12/23 appt - NP out

## 2023-09-14 ENCOUNTER — Ambulatory Visit (HOSPITAL_COMMUNITY)
Admission: RE | Admit: 2023-09-14 | Discharge: 2023-09-14 | Disposition: A | Discharge status: Home / Self Care | Payer: Medicare HMO | Source: Ambulatory Visit | Attending: Vascular Surgery | Admitting: Vascular Surgery

## 2023-09-14 ENCOUNTER — Ambulatory Visit (INDEPENDENT_AMBULATORY_CARE_PROVIDER_SITE_OTHER): Payer: Medicare HMO | Admitting: Physician Assistant

## 2023-09-14 VITALS — BP 130/76 | HR 73 | Temp 98.3°F | Ht 72.0 in | Wt 195.0 lb

## 2023-09-14 DIAGNOSIS — Z89611 Acquired absence of right leg above knee: Secondary | ICD-10-CM | POA: Diagnosis not present

## 2023-09-14 DIAGNOSIS — I70298 Other atherosclerosis of native arteries of extremities, other extremity: Secondary | ICD-10-CM | POA: Diagnosis not present

## 2023-09-14 LAB — VAS US ABI WITH/WO TBI: Left ABI: 0.99
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
## 2023-09-15 ENCOUNTER — Ambulatory Visit: Payer: Self-pay | Admitting: Licensed Clinical Social Worker

## 2023-09-15 NOTE — Patient Outreach (Signed)
 Care Coordination   Follow Up Visit Note   09/15/2023 Name: JEMMIE RHINEHART MRN: [id number] DOB: [birth year]  THADD APUZZO is a 63 y.o. year old male who sees Laretta Bolster, MD for primary care. I spoke with  Arlis Porta by phone today.  What matters to the patients health and wellness today?  Increasing access to community resources.    Goals Addressed             This Visit's Progress    COMPLETED: Increase Access to [facility] Coordination Interventions: Assessed Social Determinants of Health Reviewed all upcoming appointments in Epic system Motivational Interviewing employed Solution-Focused Strategies employed:  Active listening / Reflection utilized  Industrial/product designer Provided  SCAT transportation (Will mail information on Control and instrumentation engineer) Community food options (Will mail list of community Resources) Solution-Focused Strategies employed:           SDOH assessments and interventions completed:  Yes     Care Coordination Interventions:  Yes, provided   Interventions Today    Flowsheet Row Most Recent Value  Chronic Disease   Chronic disease during today's visit Other  [SDOH]  General Interventions   General Interventions Discussed/Reviewed [facility], General Interventions Discussed  [CSW spoke with spouse, they confirmed receipt of transportation and food resources. They denied needing any assistance accessing those. Spouse reported pt is doing better with his mobility and he will be starting PT soon.]        Follow up plan: No further intervention required.   Encounter Outcome:  Patient Visit Completed   Kenton Kingfisher, LCSW [facility], [facility] Licensed Clinical Social Worker Care Coordinator [phone]

## 2023-09-30 ENCOUNTER — Other Ambulatory Visit: Payer: Self-pay | Admitting: Student

## 2023-10-03 NOTE — Telephone Encounter (Signed)
 Refill already completed.

## 2023-10-04 ENCOUNTER — Ambulatory Visit: Payer: PPO | Admitting: Podiatry

## 2023-10-07 ENCOUNTER — Other Ambulatory Visit: Payer: Self-pay | Admitting: Student

## 2023-10-07 MED ORDER — DIVALPROEX SODIUM ER 500 MG PO TB24: 1500.0000 mg | ORAL_TABLET | Freq: Every day | ORAL | 0 refills | Status: DC

## 2023-10-07 NOTE — Telephone Encounter (Signed)
 Copied from CRM [id number]. Topic: Clinical - Prescription Issue >> Oct 07, 2023 12:12 PM Suzette B wrote: Reason for CRM: divalproex  (DEPAKOTE  ER) 500 MG 24 hr tablet 90 tablet  Sent to pharmacy as: divalproex  (DEPAKOTE  ER) 500 MG 24 hr tablet  E-Prescribing Status: Receipt confirmed by pharmacy (09/07/2023  2:36 PM EDT Patient states she continues to call the pharmacy and is informed they do not have the prescription. I informed of the date and time the provider sent it over, and advised her to also call back to the pharmacy to let them know that the prescription was electronically sent to them .

## 2023-10-07 NOTE — Telephone Encounter (Signed)
 Copied from CRM [id number]. Topic: Clinical - Medication Refill >> Oct 07, 2023 12:18 PM Danelle Dunning F wrote: Most Recent Primary Care Visit:  Provider: NOORUDDIN, SAAD  Department: [facility]  Visit Type: OPEN ESTABLISHED  Date: 08/29/2023  Medication:   1. divalproex  (DEPAKOTE  ER) 500 MG 24 hr tablet   Has the patient contacted their pharmacy? Yes  Is this the correct pharmacy for this prescription? Yes  This is the patient's preferred pharmacy:  [facility] - [place], [place] - [address] [place] [address] Phone: [phone] Fax: [phone]   Has the prescription been filled recently? No  Is the patient out of the medication? Yes  Has the patient been seen for an appointment in the last year OR does the patient have an upcoming appointment? Yes  Can we respond through MyChart? Yes  Agent: Please be advised that Rx refills may take up to 3 business days. We ask that you follow-up with your pharmacy.

## 2023-10-17 ENCOUNTER — Ambulatory Visit: Admitting: Podiatry

## 2023-10-17 ENCOUNTER — Encounter: Payer: Self-pay | Admitting: Podiatry

## 2023-10-17 DIAGNOSIS — Z89611 Acquired absence of right leg above knee: Secondary | ICD-10-CM | POA: Diagnosis not present

## 2023-10-17 DIAGNOSIS — M79675 Pain in left toe(s): Secondary | ICD-10-CM | POA: Diagnosis not present

## 2023-10-17 DIAGNOSIS — B351 Tinea unguium: Secondary | ICD-10-CM

## 2023-10-17 DIAGNOSIS — M79674 Pain in right toe(s): Secondary | ICD-10-CM | POA: Diagnosis not present

## 2023-10-17 DIAGNOSIS — I739 Peripheral vascular disease, unspecified: Secondary | ICD-10-CM

## 2023-10-17 NOTE — Progress Notes (Signed)
 This patient returns to my office for at risk foot care.  This patient requires this care by a professional since this patient will be at risk due to having PAD and amputation right leg. This patient is unable to cut nails himself since the patient cannot reach his nails.These nails are painful walking and wearing shoes.  This patient presents for at risk foot care today.  General Appearance  Alert, conversant and in no acute stress.  Vascular  Dorsalis pedis and posterior tibial  pulses are  weakly palpable  left   Capillary return is within normal limits   Temperature is within normal limits    Neurologic  Senn-Weinstein monofilament wire test within normal limits  bilaterally. Muscle power within normal limits bilaterally.  Nails Thick disfigured discolored nails with subungual debris  from hallux to fifth toes left  No evidence of bacterial infection or drainage bilaterally.  Orthopedic  No limitations of motion  feet .  No crepitus or effusions noted.  No bony pathology or digital deformities noted.  Skin  normotropic skin with no porokeratosis noted bilaterally.  No signs of infections or ulcers noted.     Onychomycosis  Pain in right toes  Pain in left toes  Consent was obtained for treatment procedures.   Mechanical debridement of nails 1-5  bilaterally performed with a nail nipper.  Filed with dremel without incident.    Return office visit    3 months                  Told patient to return for periodic foot care and evaluation due to potential at risk complications.   Ruffin Cotton DPM

## 2023-11-03 ENCOUNTER — Other Ambulatory Visit: Payer: Self-pay | Admitting: Student

## 2023-11-04 DIAGNOSIS — R7303 Prediabetes: Secondary | ICD-10-CM | POA: Diagnosis not present

## 2023-11-04 DIAGNOSIS — I10 Essential (primary) hypertension: Secondary | ICD-10-CM | POA: Diagnosis not present

## 2023-11-04 DIAGNOSIS — E785 Hyperlipidemia, unspecified: Secondary | ICD-10-CM | POA: Diagnosis not present

## 2023-11-11 ENCOUNTER — Other Ambulatory Visit: Payer: Self-pay | Admitting: Adult Health

## 2023-11-11 DIAGNOSIS — I10 Essential (primary) hypertension: Secondary | ICD-10-CM | POA: Diagnosis not present

## 2023-11-11 DIAGNOSIS — E782 Mixed hyperlipidemia: Secondary | ICD-10-CM | POA: Diagnosis not present

## 2023-11-11 DIAGNOSIS — I639 Cerebral infarction, unspecified: Secondary | ICD-10-CM | POA: Diagnosis not present

## 2023-11-11 DIAGNOSIS — I739 Peripheral vascular disease, unspecified: Secondary | ICD-10-CM | POA: Diagnosis not present

## 2023-11-11 IMAGING — CT CT HEAD W/O CM
6 of 9 series · 15 of 47 positions shown, 16 images · non-contrast
Comparison: 07/02/2015.

CLINICAL DATA: Seizure disorder. Recent increased frequency of
focal seizures



[Series 3: head without · axial · non-contrast · 0.49mm/px · z∈[+1411,+1466]mm · 2 of 34 slices shown, 3 images]
[im 12/34  brain]
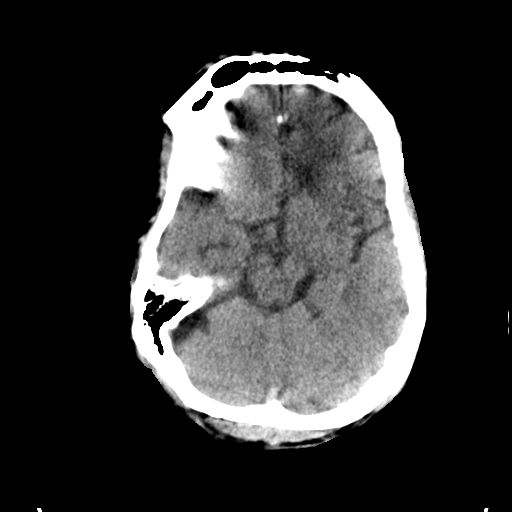
[im 12/34  bone]
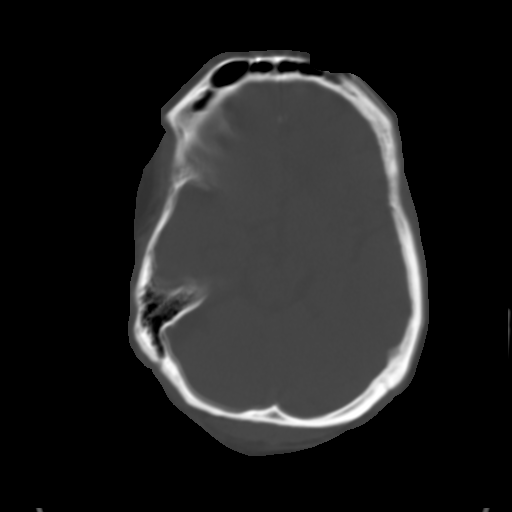
[im 23/34  brain]
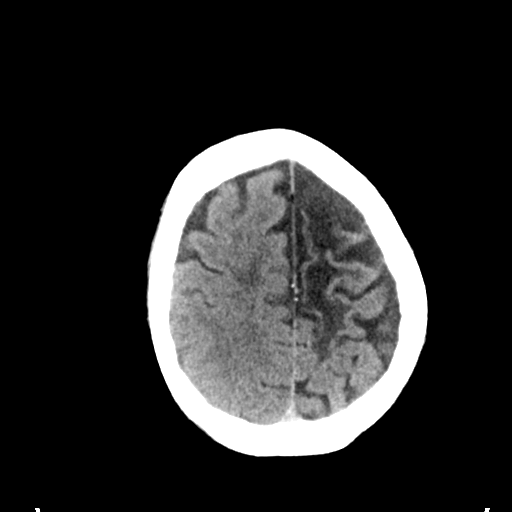

[Series 4: head without ax · axial · non-contrast · 0.36mm/px · z∈[+1426,+1477]mm · 2 of 34 slices shown (1 of 2)]
[im 12/34  brain]
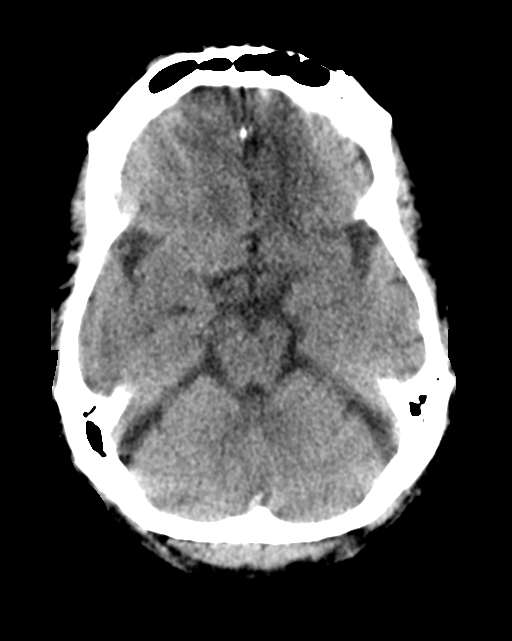
[im 23/34  brain]
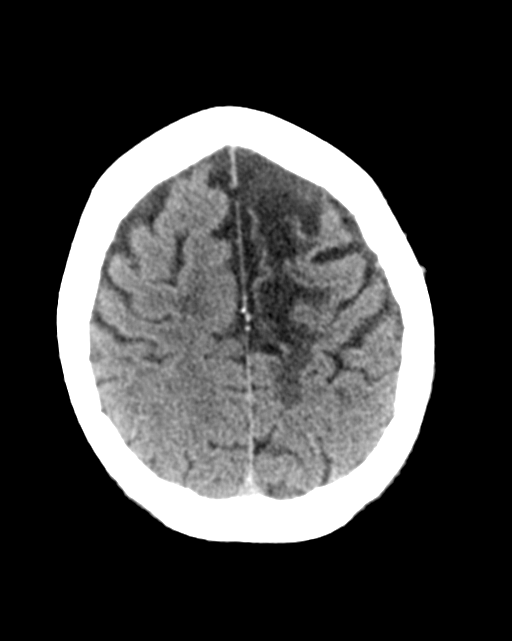

[Series 5: ax head bone · axial · 0.49mm/px · z∈[+1400,+1437]mm · 3 of 83 slices shown]
[im 11/83  bone]
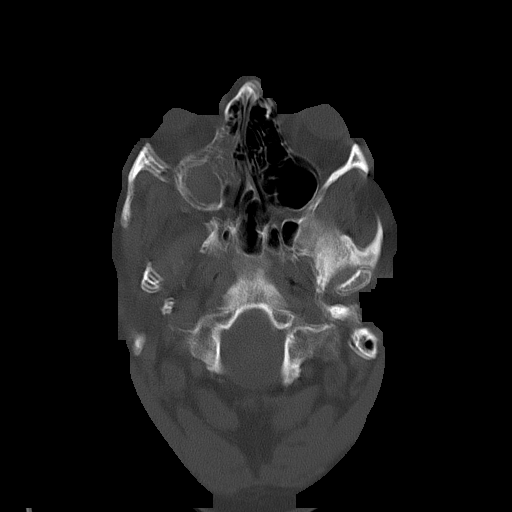
[im 21/83  bone]
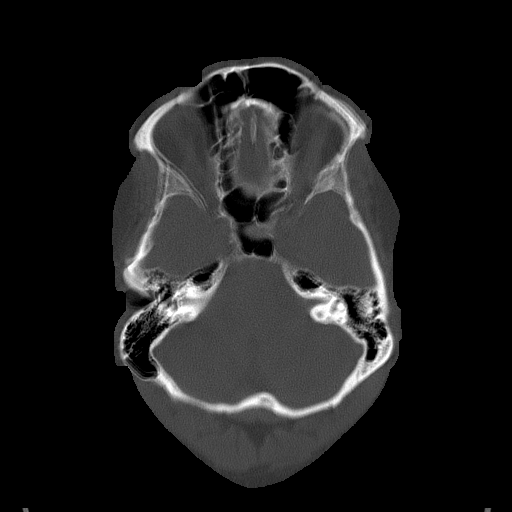
[im 31/83  bone]
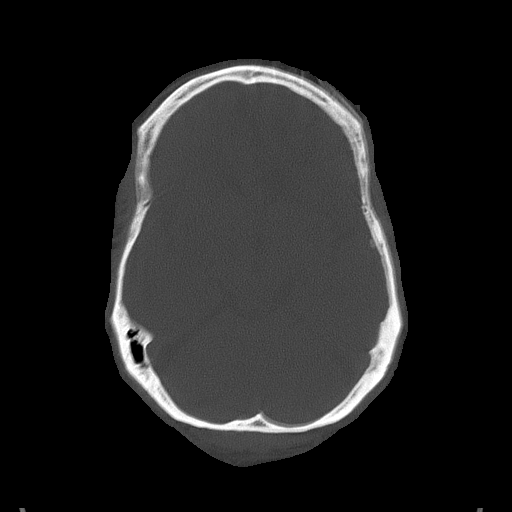

[Series 6: head without cor · coronal · non-contrast · 0.32mm/px · 3 of 71 slices shown]
[im 23/71  brain]
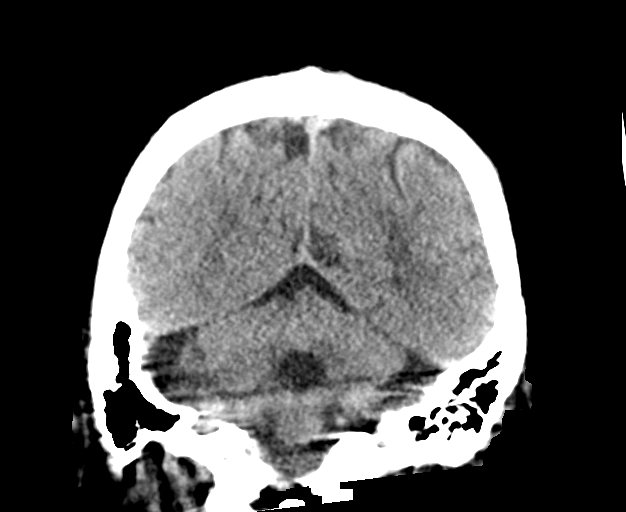
[im 38/71  brain]
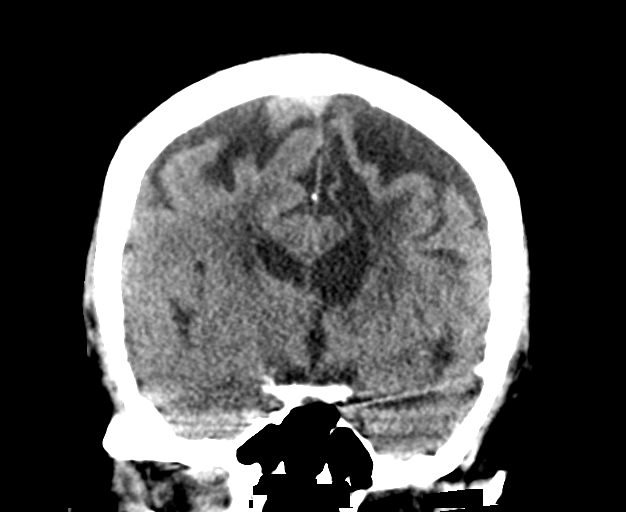
[im 53/71  brain]
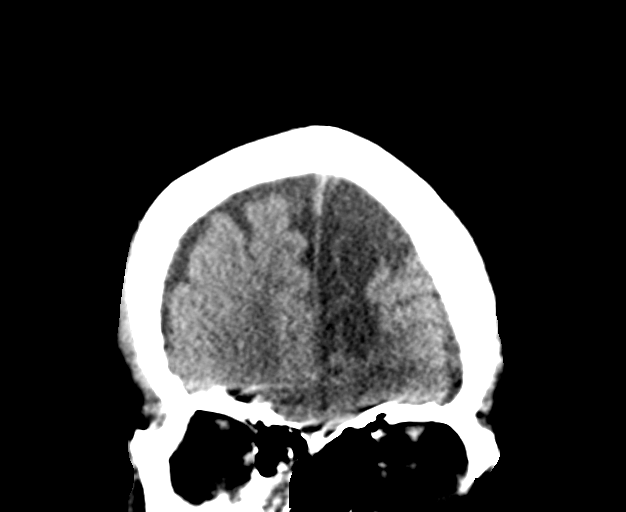

[Series 8: head without ax · axial · non-contrast · 0.43mm/px · z∈[+1440,+1491]mm · 2 of 34 slices shown (2 of 2)]
[im 12/34  brain]
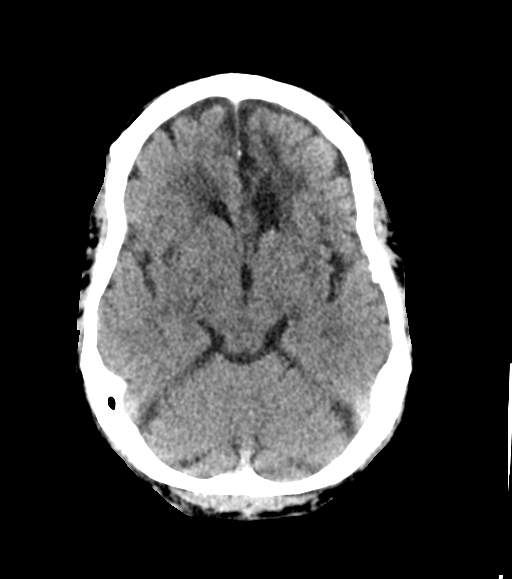
[im 23/34  brain]
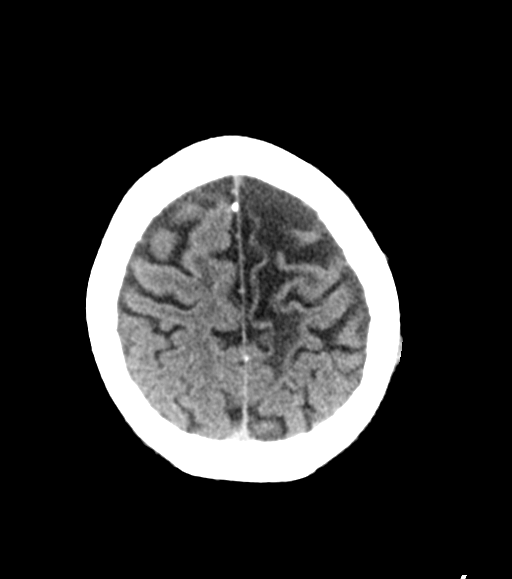

[Series 11: head without sag · sagittal · non-contrast · 0.32mm/px · 3 of 52 slices shown]
[im 10/52  brain]
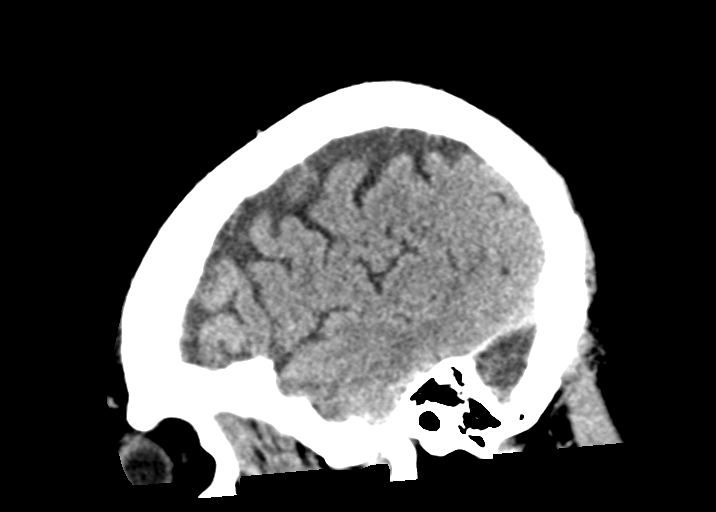
[im 24/52  brain]
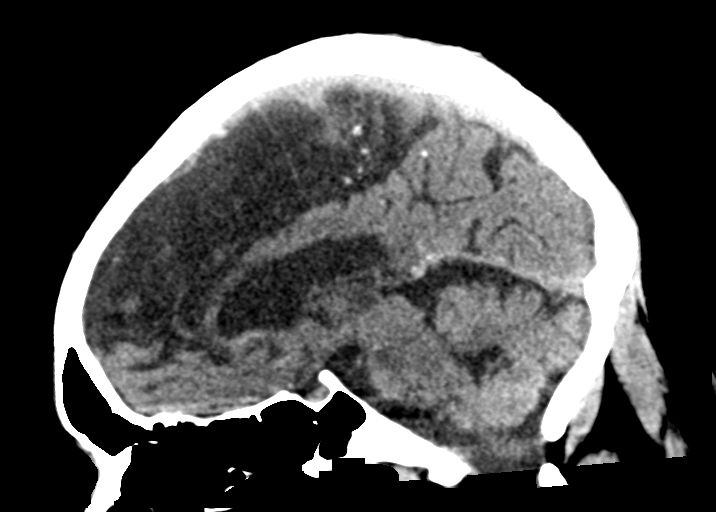
[im 38/52  brain]
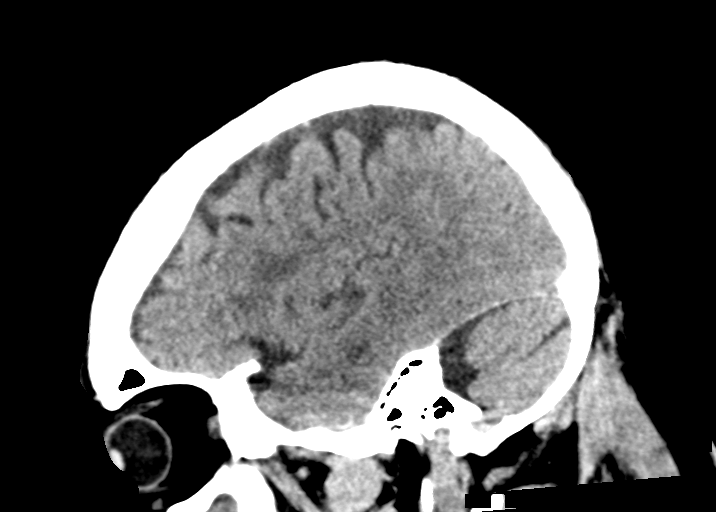

[15 of 47 positions shown; findings below may reference images not displayed]

FINDINGS: Brain: No evidence of acute infarction, hemorrhage, hydrocephalus,
extra-axial collection or mass lesion/mass effect. Progressive
encephalomalacia within the left frontal lobe compatible with remote
infarct in the left ACA territory. Associated ex vacuo dilatation of
the left lateral ventricle is identified. Unchanged appearance of
remote left basal ganglia lacunar infarcts. There is mild diffuse
low-attenuation within the subcortical and periventricular white
matter compatible with chronic microvascular disease.

Vascular: No hyperdense vessel or unexpected calcification.

Skull: Normal. Negative for fracture or focal lesion.

Sinuses/Orbits: Asymmetric opacification of the right maxillary
sinus and right ethmoid air cells identified. The remaining
paranasal sinuses and the mastoid air cells are clear.

Other: None.
IMPRESSION: 1. No acute intracranial abnormalities.
2. Progressive encephalomalacia within the left frontal lobe
compatible with remote infarct in the left ACA territory.
3. Chronic small vessel ischemic disease.

## 2023-12-02 ENCOUNTER — Other Ambulatory Visit: Payer: Self-pay | Admitting: Student

## 2023-12-05 ENCOUNTER — Telehealth: Payer: Self-pay | Admitting: *Deleted

## 2023-12-05 ENCOUNTER — Other Ambulatory Visit: Payer: Self-pay | Admitting: Student

## 2023-12-05 DIAGNOSIS — G40909 Epilepsy, unspecified, not intractable, without status epilepticus: Secondary | ICD-10-CM

## 2023-12-05 MED ORDER — LACOSAMIDE 50 MG PO TABS: 50.0000 mg | ORAL_TABLET | Freq: Two times a day (BID) | ORAL | 3 refills | Status: DC

## 2023-12-05 NOTE — Telephone Encounter (Signed)
 Call to Pharmacy DEA for Dr. Nooruddin is not going through.  Will need to resend under another Physician . Copied from CRM [id number]. Topic: Clinical - Prescription Issue >> Dec 02, 2023 12:45 PM Carmell SAUNDERS wrote: Reason for CRM: Pharmacy stated that the for lacosamide  (VIMPAT ) 50 MG TABS tablet, the DEA is rejecting. Please follow up with them.

## 2023-12-05 NOTE — Telephone Encounter (Signed)
 Copied from CRM [id number]. Topic: Clinical - Medication Refill >> Dec 05, 2023 10:05 AM Adrianna P wrote: Medication: divalproex  (DEPAKOTE  ER) 500 MG 24 hr tablet  Has the patient contacted their pharmacy? Yes (Agent: If no, request that the patient contact the pharmacy for the refill. If patient does not wish to contact the pharmacy document the reason why and proceed with request.) (Agent: If yes, when and what did the pharmacy advise?)  This is the patient's preferred pharmacy:  [facility] - [place], [place] - [address] [place] [address] Phone: [phone] Fax: [phone]  Is this the correct pharmacy for this prescription? Yes If no, delete pharmacy and type the correct one.   Has the prescription been filled recently? No  Is the patient out of the medication? No  Has the patient been seen for an appointment in the last year OR does the patient have an upcoming appointment? Yes  Can we respond through MyChart? Yes  Agent: Please be advised that Rx refills may take up to 3 business days. We ask that you follow-up with your pharmacy.

## 2023-12-05 NOTE — Telephone Encounter (Signed)
 Refilled lacosamide . He needs to follow-up with neurology.  Ozell Kung MD 12/05/2023, 5:07 PM

## 2023-12-07 ENCOUNTER — Telehealth: Payer: Self-pay | Admitting: Student

## 2023-12-07 DIAGNOSIS — G40909 Epilepsy, unspecified, not intractable, without status epilepticus: Secondary | ICD-10-CM

## 2023-12-07 NOTE — Telephone Encounter (Signed)
 Copied from CRM [id number]. Topic: Clinical - Medication Refill >> Dec 07, 2023  4:14 PM Alfonso ORN wrote: Medication:    lacosamide  (VIMPAT ) 50 MG TABS tablet,divalproex  (DEPAKOTE  ER) 500 MG 24 hr tablet       Has the patient contacted their pharmacy? Yes. (Agent: If no, request that the patient contact the pharmacy for the refill. If patient does not wish to contact the pharmacy document the reason why and proceed with request.) (Agent: If yes, when and what did the pharmacy advise?)  This is the patient's preferred pharmacy:  [facility] - [place], [place] - [address] [place] [address] Phone: [phone] Fax: [phone]  Is this the correct pharmacy for this prescription? Yes If no, delete pharmacy and type the correct one.   Has the prescription been filled recently? Yes  Is the patient out of the medication? No  , have 2 left for the divalproex  (DEPAKOTE  ER) 500 MG 24 hr tablet (down to 2 days left of his medication box he fills        Has the patient been seen for an appointment in the last year OR does the patient have an upcoming appointment? Yes  Can we respond through MyChart? Yes and perfer a phone call   Agent: Please be advised that Rx refills may take up to 3 business days. We ask that you follow-up with your pharmacy.

## 2023-12-09 ENCOUNTER — Other Ambulatory Visit: Payer: Self-pay | Admitting: Student

## 2023-12-09 DIAGNOSIS — G40909 Epilepsy, unspecified, not intractable, without status epilepticus: Secondary | ICD-10-CM

## 2023-12-09 NOTE — Telephone Encounter (Signed)
 Copied from CRM [id number]. Topic: Clinical - Medication Refill >> Dec 07, 2023  4:14 PM Alfonso ORN wrote: Medication:    lacosamide  (VIMPAT ) 50 MG TABS tablet,divalproex  (DEPAKOTE  ER) 500 MG 24 hr tablet       Has the patient contacted their pharmacy? Yes. (Agent: If no, request that the patient contact the pharmacy for the refill. If patient does not wish to contact the pharmacy document the reason why and proceed with request.) (Agent: If yes, when and what did the pharmacy advise?)  This is the patient's preferred pharmacy:  [facility] - [place], [place] - [address] [place] [address] Phone: [phone] Fax: [phone]  Is this the correct pharmacy for this prescription? Yes If no, delete pharmacy and type the correct one.   Has the prescription been filled recently? Yes  Is the patient out of the medication? No  , have 2 left for the divalproex  (DEPAKOTE  ER) 500 MG 24 hr tablet (down to 2 days left of his medication box he fills        Has the patient been seen for an appointment in the last year OR does the patient have an upcoming appointment? Yes  Can we respond through MyChart? Yes and perfer a phone call   Agent: Please be advised that Rx refills may take up to 3 business days. We ask that you follow-up with your pharmacy. >> Dec 09, 2023 12:11 PM Miquel SAILOR wrote: divalproex  (DEPAKOTE  ER) 500 MG 24 hr tablet     Patient wife Erminio calling on update for medication refilled. PT is out of medication . Needs call back on update ASAP 2nd attempt [phone]

## 2023-12-12 ENCOUNTER — Other Ambulatory Visit: Payer: Self-pay

## 2023-12-12 ENCOUNTER — Ambulatory Visit: Payer: PPO | Admitting: Adult Health

## 2023-12-12 MED ORDER — DIVALPROEX SODIUM ER 500 MG PO TB24: 1500.0000 mg | ORAL_TABLET | Freq: Every day | ORAL | 3 refills | Status: AC

## 2023-12-12 NOTE — Telephone Encounter (Signed)
 Copied from CRM [id number]. Topic: Clinical - Medication Refill >> Dec 12, 2023  3:33 PM Suzette B wrote: Medication: amLODipine -olmesartan  (AZOR ) 10-40 MG tablet  Has the patient contacted their pharmacy? Yes Patient was given enough for 3 days; was waiting for providers   This is the patient's preferred pharmacy:  [facility] - [place], [place] - [address] [place] [address] Phone: [phone] Fax: [phone]  Is this the correct pharmacy for this prescription? Yes If no, delete pharmacy and type the correct one.   Has the prescription been filled recently? Yes  Is the patient out of the medication? Yes  Has the patient been seen for an appointment in the last year OR does the patient have an upcoming appointment? Yes  Can we respond through MyChart? Yes  Agent: Please be advised that Rx refills may take up to 3 business days. We ask that you follow-up with your pharmacy. >> Dec 12, 2023  4:15 PM Mercer PEDLAR wrote: Patient's wife calling to verify if Mylene is accepted by clinic. I informed her that it is accepted and refill may take up to 3 business days to be completed.

## 2023-12-12 NOTE — Telephone Encounter (Unsigned)
 Copied from CRM [id number]. Topic: Clinical - Medication Refill >> Dec 12, 2023  3:33 PM Suzette B wrote: Medication: amLODipine -olmesartan  (AZOR ) 10-40 MG tablet  Has the patient contacted their pharmacy? Yes Patient was given enough for 3 days; was waiting for providers   This is the patient's preferred pharmacy:  [facility] - [place], [place] - [address] [place] [address] Phone: [phone] Fax: [phone]  Is this the correct pharmacy for this prescription? Yes If no, delete pharmacy and type the correct one.   Has the prescription been filled recently? Yes  Is the patient out of the medication? Yes  Has the patient been seen for an appointment in the last year OR does the patient have an upcoming appointment? Yes  Can we respond through MyChart? Yes  Agent: Please be advised that Rx refills may take up to 3 business days. We ask that you follow-up with your pharmacy.

## 2023-12-12 NOTE — Addendum Note (Signed)
 Addended by: CAMMIE GAETANA DEL on: 12/12/2023 04:36 PM   Modules accepted: Orders

## 2023-12-12 NOTE — Telephone Encounter (Signed)
 I talked to pt's wife - stated pt does not need Azor  refilled. Pt needs Depakote  refilled.

## 2023-12-12 NOTE — Telephone Encounter (Signed)
 I was unable to refill electronically; sending to PCP to refill.

## 2023-12-15 ENCOUNTER — Other Ambulatory Visit: Payer: Self-pay | Admitting: Student

## 2023-12-15 DIAGNOSIS — G40909 Epilepsy, unspecified, not intractable, without status epilepticus: Secondary | ICD-10-CM

## 2023-12-15 MED ORDER — DIVALPROEX SODIUM ER 500 MG PO TB24: 1500.0000 mg | ORAL_TABLET | Freq: Every day | ORAL | 0 refills | Status: DC

## 2023-12-15 NOTE — Telephone Encounter (Signed)
 Depakote  and Vimpat  have been refilled.

## 2023-12-15 NOTE — Telephone Encounter (Signed)
 Copied from CRM [id number]. Topic: Clinical - Medication Refill >> Dec 07, 2023  4:14 PM Alfonso ORN wrote: Medication:    lacosamide  (VIMPAT ) 50 MG TABS tablet,divalproex  (DEPAKOTE  ER) 500 MG 24 hr tablet       Has the patient contacted their pharmacy? Yes. (Agent: If no, request that the patient contact the pharmacy for the refill. If patient does not wish to contact the pharmacy document the reason why and proceed with request.) (Agent: If yes, when and what did the pharmacy advise?)  This is the patient's preferred pharmacy:  [facility] - [place], [place] - [address] [place] [address] Phone: [phone] Fax: [phone]  Is this the correct pharmacy for this prescription? Yes If no, delete pharmacy and type the correct one.   Has the prescription been filled recently? Yes  Is the patient out of the medication? No  , have 2 left for the divalproex  (DEPAKOTE  ER) 500 MG 24 hr tablet (down to 2 days left of his medication box he fills        Has the patient been seen for an appointment in the last year OR does the patient have an upcoming appointment? Yes  Can we respond through MyChart? Yes and perfer a phone call   Agent: Please be advised that Rx refills may take up to 3 business days. We ask that you follow-up with your pharmacy. >> Dec 15, 2023  1:30 PM Susanna ORN wrote: Patient's wife, Erminio, called stating that she has been trying to get divalproex  (DEPAKOTE  ER) 500 MG 24 hr tablet filled for a while now. States patient is completely out of this medication. Checked with AM via CAL line and she stated that whomever tried to send it last time, it was set to no print but she was able to send it today, 12/15/23 to [facility]. She stated that patient should still have lacosamide . Myles Erminio that the prescription has been sent to the pharmacy & to give them time to process & fill prescription. Also advised her to check  with pharmacy in a few hours to see have it been filled.  >> Dec 12, 2023  3:30 PM Farrel B wrote: Patient's spouse Erminio called to check status of Depakote  advised pts spouse its 3 business days  >> Dec 09, 2023 12:11 PM Miquel SAILOR wrote: divalproex  (DEPAKOTE  ER) 500 MG 24 hr tablet     Patient wife Erminio calling on update for medication refilled. PT is out of medication . Needs call back on update ASAP 2nd attempt [phone]

## 2023-12-23 NOTE — Telephone Encounter (Signed)
 Refill order already completed.

## 2024-01-06 ENCOUNTER — Other Ambulatory Visit: Payer: Self-pay | Admitting: Student

## 2024-01-09 NOTE — Telephone Encounter (Signed)
 Lvm for patient advising him to give his neurologist a call to schedule a follow up appointment.

## 2024-01-18 ENCOUNTER — Ambulatory Visit: Admitting: Podiatry

## 2024-01-18 ENCOUNTER — Encounter: Payer: Self-pay | Admitting: Podiatry

## 2024-01-18 DIAGNOSIS — B351 Tinea unguium: Secondary | ICD-10-CM

## 2024-01-18 DIAGNOSIS — M79675 Pain in left toe(s): Secondary | ICD-10-CM | POA: Diagnosis not present

## 2024-01-18 DIAGNOSIS — M79674 Pain in right toe(s): Secondary | ICD-10-CM | POA: Diagnosis not present

## 2024-01-18 DIAGNOSIS — Z89611 Acquired absence of right leg above knee: Secondary | ICD-10-CM

## 2024-01-18 NOTE — Progress Notes (Signed)
 This patient returns to my office for at risk foot care.  This patient requires this care by a professional since this patient will be at risk due to having PAD and amputation right leg. This patient is unable to cut nails himself since the patient cannot reach his nails.These nails are painful walking and wearing shoes.  This patient presents for at risk foot care today.  General Appearance  Alert, conversant and in no acute stress.  Vascular  Dorsalis pedis and posterior tibial  pulses are  weakly palpable  left   Capillary return is within normal limits   Temperature is within normal limits    Neurologic  Senn-Weinstein monofilament wire test within normal limits  Muscle power within normal limits bilaterally.  Nails Thick disfigured discolored nails with subungual debris  from hallux to fifth toes left  No evidence of bacterial infection or drainage bilaterally.  Orthopedic  No limitations of motion  feet .  No crepitus or effusions noted.  No bony pathology or digital deformities noted.  Skin  normotropic skin with no porokeratosis noted  No signs of infections or ulcers noted.     Onychomycosis  Pain in right toes  Pain in left toes  Consent was obtained for treatment procedures.   Mechanical debridement of nails 1-5  bilaterally performed with a nail nipper.  Filed with dremel without incident.    Return office visit    3 months                  Told patient to return for periodic foot care and evaluation due to potential at risk complications.   Cordella Bold DPM

## 2024-02-06 ENCOUNTER — Other Ambulatory Visit: Payer: Self-pay | Admitting: Adult Health

## 2024-02-17 DIAGNOSIS — I10 Essential (primary) hypertension: Secondary | ICD-10-CM | POA: Diagnosis not present

## 2024-02-17 DIAGNOSIS — I739 Peripheral vascular disease, unspecified: Secondary | ICD-10-CM | POA: Diagnosis not present

## 2024-02-17 DIAGNOSIS — I639 Cerebral infarction, unspecified: Secondary | ICD-10-CM | POA: Diagnosis not present

## 2024-02-17 DIAGNOSIS — E782 Mixed hyperlipidemia: Secondary | ICD-10-CM | POA: Diagnosis not present

## 2024-02-28 ENCOUNTER — Telehealth: Payer: Self-pay | Admitting: *Deleted

## 2024-02-28 NOTE — Telephone Encounter (Addendum)
 Pt meets Covid vaccine criteria without a prescription, but will have to complete paperwork at pharmacy.  No paperwork required with a prescription, wife requesting office send in rx,Robt Okuda Cassady9/16/202510:09 AM

## 2024-03-05 DIAGNOSIS — K5730 Diverticulosis of large intestine without perforation or abscess without bleeding: Secondary | ICD-10-CM | POA: Diagnosis not present

## 2024-03-05 DIAGNOSIS — I6789 Other cerebrovascular disease: Secondary | ICD-10-CM | POA: Diagnosis not present

## 2024-03-05 DIAGNOSIS — Z1211 Encounter for screening for malignant neoplasm of colon: Secondary | ICD-10-CM | POA: Diagnosis not present

## 2024-03-19 ENCOUNTER — Other Ambulatory Visit: Payer: Self-pay | Admitting: Gastroenterology

## 2024-03-30 ENCOUNTER — Other Ambulatory Visit: Payer: Self-pay | Admitting: Student

## 2024-04-06 ENCOUNTER — Other Ambulatory Visit: Payer: Self-pay | Admitting: Student

## 2024-04-06 NOTE — Progress Notes (Incomplete)
 COVID Vaccine Completed: yes  Date of COVID positive in last 90 days:  PCP - Missy Sandhoff, MD Cardiologist -  Neurology- Harlene Bogaert, NP LOV 03/25/22  Chest x-ray - N/A EKG - N/A Stress Test - N/A ECHO - 01/16/13 Epic Cardiac Cath -  Pacemaker/ICD device last checked:N/A Spinal Cord Stimulator:N/A  Bowel Prep - two day prep  Sleep Study - N/A CPAP -   Fasting Blood Sugar - N/A Checks Blood Sugar _____ times a day  Last dose of GLP1 agonist-  N/A GLP1 instructions:  Do not take after     Last dose of SGLT-2 inhibitors-  N/A SGLT-2 instructions:  Do not take after     Blood Thinner Instructions: N/A Last dose:   Time: Aspirin  Instructions: ASA 81 Last Dose:  Activity level:  Can go up a flight of stairs and perform activities of daily living without stopping and without symptoms of chest pain or shortness of breath.   Patient in wheelchair, AKA-R  Able to exercise without symptoms  Unable to go up a flight of stairs without symptoms of     Anesthesia review: HTN, ASCVD, PAD, seizures, anemia, stroke- hemiparesis, AKA-R  Patient denies shortness of breath, fever, cough and chest pain at PAT appointment  Patient verbalized understanding of instructions that were given to them at the PAT appointment. Patient was also instructed that they will need to review over the PAT instructions again at home before surgery.

## 2024-04-13 ENCOUNTER — Encounter (HOSPITAL_COMMUNITY)
Admission: RE | Disposition: A | Discharge status: Home / Self Care | Payer: Self-pay | Source: Home / Self Care | Attending: Gastroenterology

## 2024-04-13 ENCOUNTER — Encounter (HOSPITAL_COMMUNITY): Payer: Self-pay | Admitting: Gastroenterology

## 2024-04-13 ENCOUNTER — Ambulatory Visit (HOSPITAL_COMMUNITY)

## 2024-04-13 ENCOUNTER — Other Ambulatory Visit: Payer: Self-pay

## 2024-04-13 ENCOUNTER — Ambulatory Visit (HOSPITAL_COMMUNITY)
Admission: RE | Admit: 2024-04-13 | Discharge: 2024-04-13 | Disposition: A | Discharge status: Home / Self Care | Attending: Gastroenterology | Admitting: Gastroenterology

## 2024-04-13 DIAGNOSIS — Z8249 Family history of ischemic heart disease and other diseases of the circulatory system: Secondary | ICD-10-CM | POA: Insufficient documentation

## 2024-04-13 DIAGNOSIS — D120 Benign neoplasm of cecum: Secondary | ICD-10-CM | POA: Insufficient documentation

## 2024-04-13 DIAGNOSIS — F17210 Nicotine dependence, cigarettes, uncomplicated: Secondary | ICD-10-CM | POA: Diagnosis not present

## 2024-04-13 DIAGNOSIS — D123 Benign neoplasm of transverse colon: Secondary | ICD-10-CM | POA: Insufficient documentation

## 2024-04-13 DIAGNOSIS — K635 Polyp of colon: Secondary | ICD-10-CM | POA: Diagnosis not present

## 2024-04-13 DIAGNOSIS — Z1211 Encounter for screening for malignant neoplasm of colon: Secondary | ICD-10-CM | POA: Diagnosis not present

## 2024-04-13 DIAGNOSIS — I10 Essential (primary) hypertension: Secondary | ICD-10-CM

## 2024-04-13 DIAGNOSIS — I739 Peripheral vascular disease, unspecified: Secondary | ICD-10-CM | POA: Diagnosis not present

## 2024-04-13 DIAGNOSIS — Z8673 Personal history of transient ischemic attack (TIA), and cerebral infarction without residual deficits: Secondary | ICD-10-CM | POA: Insufficient documentation

## 2024-04-13 DIAGNOSIS — K5730 Diverticulosis of large intestine without perforation or abscess without bleeding: Secondary | ICD-10-CM | POA: Insufficient documentation

## 2024-04-13 DIAGNOSIS — F419 Anxiety disorder, unspecified: Secondary | ICD-10-CM

## 2024-04-13 DIAGNOSIS — Z139 Encounter for screening, unspecified: Secondary | ICD-10-CM | POA: Diagnosis not present

## 2024-04-13 DIAGNOSIS — Z860101 Personal history of adenomatous and serrated colon polyps: Secondary | ICD-10-CM | POA: Diagnosis present

## 2024-04-13 SURGERY — COLONOSCOPY
Anesthesia: Monitor Anesthesia Care

## 2024-04-13 MED ORDER — SODIUM CHLORIDE 0.9 % IV SOLN: INTRAVENOUS | Status: DC

## 2024-04-13 MED ORDER — PROPOFOL 500 MG/50ML IV EMUL
INTRAVENOUS | Status: DC | PRN
  Administered 2024-04-13: 125 ug/kg/min via INTRAVENOUS

## 2024-04-13 MED ORDER — LIDOCAINE 2% (20 MG/ML) 5 ML SYRINGE
INTRAMUSCULAR | Status: DC | PRN
Start: 2024-04-13 — End: 2024-04-13
  Administered 2024-04-13 (×2): 50 mg via INTRAVENOUS

## 2024-04-13 MED ORDER — PROPOFOL 500 MG/50ML IV EMUL
INTRAVENOUS | Status: AC
  Filled 2024-04-13: qty 50
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
## 2024-04-13 NOTE — Op Note (Signed)
 [facility] Patient Name: Gerald [name] Procedure Date: 04/13/2024 MRN: [id number] Attending MD: Belvie Just , MD, [id number] Date of Birth: 07/05/1960 CSN: [id number] Age: 63 Admit Type: Outpatient Procedure:                Colonoscopy Indications:              High risk colon cancer surveillance: Personal                            history of colonic polyps Providers:                Belvie Just, MD, Hoy Penner, RN, Felice Sar,                            Technician Referring MD:              Medicines:                Propofol  per Anesthesia Complications:            No immediate complications. Estimated Blood Loss:     Estimated blood loss: none. Procedure:                Pre-Anesthesia Assessment:                           - Prior to the procedure, a History and Physical                            was performed, and patient medications and                            allergies were reviewed. The patient's tolerance of                            previous anesthesia was also reviewed. The risks                            and benefits of the procedure and the sedation                            options and risks were discussed with the patient.                            All questions were answered, and informed consent                            was obtained. Prior Anticoagulants: The patient has                            taken no anticoagulant or antiplatelet agents. ASA                            Grade Assessment: III - A patient with severe                            systemic  disease. After reviewing the risks and                            benefits, the patient was deemed in satisfactory                            condition to undergo the procedure.                           - Sedation was administered by an anesthesia                            professional. Deep sedation was attained.                           After obtaining informed consent, the  colonoscope                            was passed under direct vision. Throughout the                            procedure, the patient's blood pressure, pulse, and                            oxygen  saturations were monitored continuously. The                            CF-HQ190L ([id number]) Olympus colonoscope was                            introduced through the anus and advanced to the the                            cecum, identified by appendiceal orifice and                            ileocecal valve. The colonoscopy was performed                            without difficulty. The patient tolerated the                            procedure well. The quality of the bowel                            preparation was evaluated using the BBPS [facility]                            Bowel Preparation Scale) with scores of: Right                            Colon = 3 (entire mucosa seen well with no residual  staining, small fragments of stool or opaque                            liquid), Transverse Colon = 2 (minor amount of                            residual staining, small fragments of stool and/or                            opaque liquid, but mucosa seen well) and Left Colon                            = 2 (minor amount of residual staining, small                            fragments of stool and/or opaque liquid, but mucosa                            seen well). The total BBPS score equals 7. The                            quality of the bowel preparation was good. The                            ileocecal valve, appendiceal orifice, and rectum                            were photographed. Scope In: 11:30:46 AM Scope Out: 11:44:43 AM Scope Withdrawal Time: 0 hours 11 minutes 21 seconds  Total Procedure Duration: 0 hours 13 minutes 57 seconds  Findings:      Two sessile polyps were found in the transverse colon and cecum. The       polyps were 2 to 3 mm in size. These  polyps were removed with a cold       snare. Resection and retrieval were complete.      Scattered large-mouthed, medium-mouthed and small-mouthed diverticula       were found in the entire colon. Impression:               - Two 2 to 3 mm polyps in the transverse colon and                            in the cecum, removed with a cold snare. Resected                            and retrieved.                           - Diverticulosis in the entire examined colon. Moderate Sedation:      Not Applicable - Patient had care per Anesthesia. Recommendation:           - Patient has a contact number available for  emergencies. The signs and symptoms of potential                            delayed complications were discussed with the                            patient. Return to normal activities tomorrow.                            Written discharge instructions were provided to the                            patient.                           - Resume regular diet.                           - Await pathology results.                           - Continue present medications.                           - Await pathology results.                           - Repeat colonoscopy in 7 years for surveillance. Procedure Code(s):        --- Professional ---                           [id number], Colonoscopy, flexible; with removal of                            tumor(s), polyp(s), or other lesion(s) by snare                            technique Diagnosis Code(s):        --- Professional ---                           Z86.010, Personal history of colonic polyps                           D12.3, Benign neoplasm of transverse colon (hepatic                            flexure or splenic flexure)                           D12.0, Benign neoplasm of cecum                           K57.30, Diverticulosis of large intestine without                            perforation or abscess without  bleeding CPT  copyright 2022 American Medical Association. All rights reserved. The codes documented in this report are preliminary and upon coder review may  be revised to meet current compliance requirements. Belvie Just, MD Belvie Just, MD 04/13/2024 11:51:54 AM This report has been signed electronically. Number of Addenda: 0

## 2024-04-13 NOTE — Anesthesia Preprocedure Evaluation (Signed)
 Anesthesia Evaluation  Patient identified by MRN, date of birth, ID band Patient awake    Reviewed: Allergy & Precautions, H&P , NPO status , Patient's Chart, lab work & pertinent test results  History of Anesthesia Complications Negative for: history of anesthetic complications  Airway Mallampati: II  TM Distance: >3 FB Neck ROM: Full    Dental no notable dental hx.    Pulmonary neg sleep apnea, Current Smoker   Pulmonary exam normal breath sounds clear to auscultation       Cardiovascular hypertension, (-) angina + Peripheral Vascular Disease  (-) Past MI Normal cardiovascular exam Rhythm:Regular Rate:Normal     Neuro/Psych Seizures -, Well Controlled,  PSYCHIATRIC DISORDERS Anxiety     CVA, No Residual Symptoms    GI/Hepatic negative GI ROS, Neg liver ROS,,,  Endo/Other  negative endocrine ROS    Renal/GU negative Renal ROS  negative genitourinary   Musculoskeletal negative musculoskeletal ROS (+)    Abdominal   Peds negative pediatric ROS (+)  Hematology negative hematology ROS (+)   Anesthesia Other Findings   Reproductive/Obstetrics negative OB ROS                              Anesthesia Physical Anesthesia Plan  ASA: 3  Anesthesia Plan: MAC   Post-op Pain Management:    Induction: Intravenous  PONV Risk Score and Plan: 0 and Propofol  infusion and Treatment may vary due to age or medical condition  Airway Management Planned: Natural Airway  Additional Equipment:   Intra-op Plan:   Post-operative Plan:   Informed Consent: I have reviewed the patients History and Physical, chart, labs and discussed the procedure including the risks, benefits and alternatives for the proposed anesthesia with the patient or authorized representative who has indicated his/her understanding and acceptance.     Dental advisory given  Plan Discussed with: CRNA  Anesthesia Plan  Comments:          Anesthesia Quick Evaluation

## 2024-04-13 NOTE — Transfer of Care (Signed)
 Immediate Anesthesia Transfer of Care Note  Patient: Gerald [name]  Procedure(s) Performed: COLONOSCOPY  Patient Location: PACU  Anesthesia Type:MAC  Level of Consciousness: drowsy  Airway & Oxygen  Therapy: Patient Spontanous Breathing  Post-op Assessment: Report given to RN and Post -op Vital signs reviewed and stable  Post vital signs: Reviewed and stable  Last Vitals:  Vitals Value Taken Time  BP 111/61 04/13/24 11:53  Temp    Pulse 66 04/13/24 11:53  Resp 18 04/13/24 11:53  SpO2 99 % on RA 04/13/24 11:53  Vitals shown include unfiled device data.  Last Pain:  Vitals:   04/13/24 1009  TempSrc: Temporal  PainSc: 0-No pain         Complications: No notable events documented.

## 2024-04-13 NOTE — Discharge Instructions (Signed)

## 2024-04-13 NOTE — Anesthesia Postprocedure Evaluation (Signed)
 Anesthesia Post Note  Patient: Gerald [name]  Procedure(s) Performed: COLONOSCOPY     Patient location during evaluation: PACU Anesthesia Type: MAC Level of consciousness: awake and alert Pain management: pain level controlled Vital Signs Assessment: post-procedure vital signs reviewed and stable Respiratory status: spontaneous breathing, nonlabored ventilation, respiratory function stable and patient connected to nasal cannula oxygen  Cardiovascular status: stable and blood pressure returned to baseline Postop Assessment: no apparent nausea or vomiting Anesthetic complications: no   No notable events documented.  Last Vitals:  Vitals:   04/13/24 1212 04/13/24 1213  BP: (!) 158/86 (!) 159/82  Pulse: 68 70  Resp: 16 18  Temp:    SpO2: 91% (!) 89%    Last Pain:  Vitals:   04/13/24 1212  TempSrc:   PainSc: 0-No pain                 Thom [name]

## 2024-04-14 ENCOUNTER — Encounter (HOSPITAL_COMMUNITY): Payer: Self-pay | Admitting: Gastroenterology

## 2024-04-17 LAB — SURGICAL PATHOLOGY

## 2024-04-19 ENCOUNTER — Ambulatory Visit: Admitting: Podiatry

## 2024-04-24 ENCOUNTER — Telehealth: Payer: Self-pay | Admitting: Student

## 2024-04-24 NOTE — Telephone Encounter (Signed)
 Attempted to contact patient via telephone, but no answer.  Left detailed message to call the office back and ask to speak with me.  Copied from CRM [id number]. Topic: General - Other >> Apr 20, 2024  4:00 PM DeAngela L wrote: Reason for CRM: Wife Erminio calling after insurance provider told her they can't find Dr Celestina in their system and explained the attending doctor should be listed with insurance provider, she would like to ask if the office can provide her with the correct npi number to use   Wife Erminio hermanns [phone] (H)

## 2024-05-04 ENCOUNTER — Other Ambulatory Visit: Payer: Self-pay | Admitting: Student

## 2024-05-04 ENCOUNTER — Other Ambulatory Visit: Payer: Self-pay | Admitting: Adult Health

## 2024-05-04 ENCOUNTER — Ambulatory Visit: Admitting: Podiatry

## 2024-05-04 DIAGNOSIS — G40909 Epilepsy, unspecified, not intractable, without status epilepticus: Secondary | ICD-10-CM

## 2024-05-14 ENCOUNTER — Telehealth: Payer: Self-pay | Admitting: Student

## 2024-05-14 ENCOUNTER — Ambulatory Visit: Admitting: Podiatry

## 2024-05-14 NOTE — Telephone Encounter (Signed)
 Call completed and spoke with patient's wife.   Copied from CRM #[id number]. Topic: General - Call Back - No Documentation >> May 09, 2024 12:12 PM DeAngela L wrote: Reason for CRM: patient wife Erminio returning a call for [place] in the office   Palmer call back num [phone]

## 2024-05-17 ENCOUNTER — Telehealth: Payer: Self-pay | Admitting: Adult Health

## 2024-05-17 ENCOUNTER — Ambulatory Visit: Admitting: Podiatry

## 2024-05-17 ENCOUNTER — Encounter: Payer: Self-pay | Admitting: Podiatry

## 2024-05-17 DIAGNOSIS — B351 Tinea unguium: Secondary | ICD-10-CM | POA: Diagnosis not present

## 2024-05-17 DIAGNOSIS — Z89611 Acquired absence of right leg above knee: Secondary | ICD-10-CM | POA: Diagnosis not present

## 2024-05-17 DIAGNOSIS — M79675 Pain in left toe(s): Secondary | ICD-10-CM

## 2024-05-17 DIAGNOSIS — M79674 Pain in right toe(s): Secondary | ICD-10-CM | POA: Diagnosis not present

## 2024-05-17 DIAGNOSIS — L989 Disorder of the skin and subcutaneous tissue, unspecified: Secondary | ICD-10-CM

## 2024-05-17 NOTE — Addendum Note (Signed)
 Addended by: LOREDA HACKER on: 05/17/2024 09:11 AM   Modules accepted: Level of Service

## 2024-05-17 NOTE — Progress Notes (Signed)
 This patient returns to my office for at risk foot care.  This patient requires this care by a professional since this patient will be at risk due to having PAD and amputation right leg. This patient is unable to cut nails himself since the patient cannot reach his nails.These nails are painful walking and wearing shoes.  This patient presents for at risk foot care today.  General Appearance  Alert, conversant and in no acute stress.  Vascular  Dorsalis pedis and posterior tibial  pulses are  weakly palpable  left   Capillary return is within normal limits   Temperature is within normal limits    Neurologic  Senn-Weinstein monofilament wire test within normal limits  Muscle power within normal limits  Nails Thick disfigured discolored nails with subungual debris  from hallux to fifth toes left  No evidence of bacterial infection or drainage.    Orthopedic  No limitations of motion  feet .  No crepitus or effusions noted.  No bony pathology or digital deformities noted.  Skin  normotropic skin with no porokeratosis noted  No signs of infections or ulcers noted.  Skin lesion on distal aspect 3rd toe left foot.    Onychomycosis  Pain in right toes  Pain in left toes  Skin lesion third toe left foot.  Consent was obtained for treatment procedures.   Mechanical debridement of nails 1-5  bilaterally performed with a nail nipper.  Filed with dremel without incident.    Return office visit    3 months                  Told patient to return for periodic foot care and evaluation due to potential at risk complications. Upon debridement of nails left foot she has scab-like lesion noted with bleeding noted.  The caregiver said this appeared on Monday.  The site was cauterized and DSD applied. He was told to call the office concerning the skin lesion if needed.   RTC 3 months.   Cordella Bold DPM
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
## 2024-05-17 NOTE — Telephone Encounter (Signed)
 LVM and sent MyChart msg informing pt of r/s needed for 12/5 appt- office closed due to weather.

## 2024-05-18 ENCOUNTER — Ambulatory Visit: Admitting: Adult Health

## 2024-06-06 ENCOUNTER — Ambulatory Visit: Admitting: Podiatry

## 2024-06-29 ENCOUNTER — Other Ambulatory Visit: Payer: Self-pay | Admitting: Adult Health

## 2024-07-03 NOTE — Telephone Encounter (Signed)
 Pt has been scheduled. Please send refill in.
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
## 2024-07-12 ENCOUNTER — Telehealth: Payer: Self-pay | Admitting: Adult Health

## 2024-07-12 ENCOUNTER — Ambulatory Visit: Admitting: Adult Health

## 2024-07-12 ENCOUNTER — Encounter: Payer: Self-pay | Admitting: Adult Health

## 2024-07-12 VITALS — BP 121/69 | HR 69

## 2024-07-12 DIAGNOSIS — G40909 Epilepsy, unspecified, not intractable, without status epilepticus: Secondary | ICD-10-CM | POA: Diagnosis not present

## 2024-07-12 DIAGNOSIS — G8110 Spastic hemiplegia affecting unspecified side: Secondary | ICD-10-CM

## 2024-07-12 DIAGNOSIS — Z8673 Personal history of transient ischemic attack (TIA), and cerebral infarction without residual deficits: Secondary | ICD-10-CM | POA: Diagnosis not present

## 2024-07-12 DIAGNOSIS — Z5181 Encounter for therapeutic drug level monitoring: Secondary | ICD-10-CM | POA: Diagnosis not present

## 2024-07-12 DIAGNOSIS — S78111A Complete traumatic amputation at level between right hip and knee, initial encounter: Secondary | ICD-10-CM | POA: Diagnosis not present

## 2024-07-12 NOTE — Telephone Encounter (Signed)
 [facility]  will take Pt

## 2024-07-12 NOTE — Patient Instructions (Addendum)
 Gerald [name]

## 2024-07-14 LAB — COMPREHENSIVE METABOLIC PANEL WITH GFR
ALT: 12 [IU]/L (ref 0–44)
AST: 17 [IU]/L (ref 0–40)
Albumin: 3.3 g/dL — ABNORMAL LOW (ref 3.9–4.9)
Alkaline Phosphatase: 66 [IU]/L (ref 47–123)
BUN/Creatinine Ratio: 13 (ref 10–24)
BUN: 8 mg/dL (ref 8–27)
Bilirubin Total: 0.7 mg/dL (ref 0.0–1.2)
CO2: 28 mmol/L (ref 20–29)
Calcium: 8.9 mg/dL (ref 8.6–10.2)
Chloride: 103 mmol/L (ref 96–106)
Creatinine, Ser: 0.61 mg/dL — ABNORMAL LOW (ref 0.76–1.27)
Globulin, Total: 3.4 g/dL (ref 1.5–4.5)
Glucose: 143 mg/dL — ABNORMAL HIGH (ref 70–99)
Potassium: 3.2 mmol/L — ABNORMAL LOW (ref 3.5–5.2)
Sodium: 144 mmol/L (ref 134–144)
Total Protein: 6.7 g/dL (ref 6.0–8.5)
eGFR: 109 mL/min/{1.73_m2}

## 2024-07-14 LAB — CBC WITH DIFFERENTIAL/PLATELET
Basophils Absolute: 0 10*3/uL (ref 0.0–0.2)
Basos: 1 %
EOS (ABSOLUTE): 0.2 10*3/uL (ref 0.0–0.4)
Eos: 4 %
Hematocrit: 45.9 % (ref 37.5–51.0)
Hemoglobin: 15.6 g/dL (ref 13.0–17.7)
Immature Grans (Abs): 0 10*3/uL (ref 0.0–0.1)
Immature Granulocytes: 0 %
Lymphocytes Absolute: 1.9 10*3/uL (ref 0.7–3.1)
Lymphs: 40 %
MCH: 32.6 pg (ref 26.6–33.0)
MCHC: 34 g/dL (ref 31.5–35.7)
MCV: 96 fL (ref 79–97)
Monocytes Absolute: 0.5 10*3/uL (ref 0.1–0.9)
Monocytes: 10 %
Neutrophils Absolute: 2.1 10*3/uL (ref 1.4–7.0)
Neutrophils: 45 %
Platelets: 146 10*3/uL — ABNORMAL LOW (ref 150–450)
RBC: 4.78 x10E6/uL (ref 4.14–5.80)
RDW: 12.5 % (ref 11.6–15.4)
WBC: 4.8 10*3/uL (ref 3.4–10.8)

## 2024-07-14 LAB — LACOSAMIDE: Lacosamide: 7.3 ug/mL (ref 5.0–10.0)

## 2024-07-14 LAB — VALPROIC ACID LEVEL: Valproic Acid Lvl: 87 ug/mL (ref 50–100)

## 2024-07-17 NOTE — Telephone Encounter (Signed)
 Jim From [facility] called to request to speak to nurse , Signe stated they saw Pt last week  and notice that he has wheezing in right lower lung  and history of cough . Pt aslo has intermitted chocking  and swallowing . Pt urine is dark color  and have History of  Dehydration . Signe has giving a  mediation that Pt is taking     Signe  would like pt to  be seen foe Physical Therapy 1x-8wk Also  Signe recommend Speech Therapy  for swallow test   Callback number is  [phone]

## 2024-07-17 NOTE — Telephone Encounter (Signed)
Can you triage

## 2024-07-18 ENCOUNTER — Ambulatory Visit: Payer: Self-pay | Admitting: Adult Health

## 2024-07-18 NOTE — Telephone Encounter (Signed)
 Yes for PT visits and yes for swallow study. Thank you.

## 2024-07-19 ENCOUNTER — Other Ambulatory Visit: Payer: Self-pay | Admitting: Student

## 2024-07-19 ENCOUNTER — Ambulatory Visit: Payer: Self-pay | Admitting: Student

## 2024-07-19 DIAGNOSIS — I69322 Dysarthria following cerebral infarction: Secondary | ICD-10-CM

## 2024-07-19 NOTE — Telephone Encounter (Signed)
 Dedra ST from [facility] called to informed that Pt urine was bright orange. Dedra stated Pt was drinking cranberry juice this morning  . and Pt may be having some urine issue and wanted to follow up with nurse about  what to do   Callback number is [phone]

## 2024-07-19 NOTE — Telephone Encounter (Signed)
Can you triage

## 2024-07-19 NOTE — Telephone Encounter (Signed)
 Agree with trying to encourage patient to increase water intake to see if this helps the concentration of his urine. If this persists, would recommend follow up with PCP to rule out UTI contributing to symptoms.   Order for MBS completed per request of home health speech therapy. Thank you!

## 2024-07-19 NOTE — Telephone Encounter (Signed)
 Copied from CRM [id number]. Topic: Referral - Question >> Jul 19, 2024  1:00 PM Cherylann RAMAN wrote: Reason for CRM: Speech Therapist, Deirdre, is requesting that a referral for a Modified Barium Swallow Study be sent over to check fpr aspiration. As patient has Dysphagia and has and intake when coughing. Deirdre has requested the referral to be sent to Referral Fax:[phone]. For any questions regarding what to put in the referral please contact [phone]. Deirdre can be contacted at [phone] for additional information.   ----------------------------------------------------------------------- From previous Reason for Contact - Referral Request: Did the patient discuss referral with their provider in the last year? No (If No - schedule appointment) (If Yes - send message)  Appointment offered? No  Type of order/referral and detailed reason for visit: Pt has Dysphagia and Speech Therapist is wanting a referral to be sent for a Modified Barium Swallow Study to be sent over for check for aspiration as pt is coughing with intake.   Preference of office, provider, location: [facility]   If referral order, have you been seen by this specialty before?   (If Yes, this issue or another issue? When? Where?  Can we respond through MyChart?

## 2024-07-19 NOTE — Telephone Encounter (Signed)
 Returned Deirdra's call. She states she noticed orange urine. Pt has been drinking cranberry juice. She states he was receptive to her suggestion to stop drinking the cranberry juice for now and switch to water. He has some trouble emptying his bladder. She is unsure if part of the reason is poor trunk control. She does recommend MBS at [facility]. I told her we would be placing order and will also reach out to pt's wife to advise her to schedule a visit with primary care for evaluation.    I called [facility] & gave v.o. for PT visits and also let him know we would be ordering the MBS and calling pt's wife. He thanked me for the call.   I called the pt's wife (on DPR) and LVM (ok per DPR) with recommendation to call pcp for reasons stated above. I told her we were going to order an MBS. I asked her for a call back.

## 2024-07-19 NOTE — Telephone Encounter (Signed)
 Gerald [name] is requesting a referral for MBS for dysphagia. [name] stated pt is bedbound. LOV was 08/29/23. [name] stated to call pt's wife for a virtual appt.  I called pt's wife, Gerald [name], to schedule pt a virtual appt. Pt does have My Chart. Virtual appt scheduled w/Dr Tobie today @ 1345 PM.

## 2024-07-20 ENCOUNTER — Telehealth (HOSPITAL_COMMUNITY): Payer: Self-pay

## 2024-07-20 NOTE — Telephone Encounter (Signed)
 Attempted to contact patient at [phone] to schedule OP MBS. Left voicemail with request for call back.

## 2024-08-01 ENCOUNTER — Ambulatory Visit: Payer: Medicare HMO

## 2024-08-17 ENCOUNTER — Ambulatory Visit: Admitting: Podiatry

## 2025-07-15 ENCOUNTER — Ambulatory Visit: Admitting: Adult Health
